# Patient Record
Sex: Male | Born: 1990 | Race: Black or African American | Hispanic: No | Marital: Single
Health system: Southern US, Academic
[De-identification: ages and names within clinical notes are randomized; demographics above are authoritative.]

## PROBLEM LIST (undated history)

## (undated) ENCOUNTER — Encounter

## (undated) ENCOUNTER — Ambulatory Visit

## (undated) ENCOUNTER — Ambulatory Visit: Attending: Clinical | Primary: Clinical

## (undated) ENCOUNTER — Encounter
Attending: Student in an Organized Health Care Education/Training Program | Primary: Student in an Organized Health Care Education/Training Program

## (undated) ENCOUNTER — Encounter: Attending: Blood Banking & Transfusion Medicine | Primary: Blood Banking & Transfusion Medicine

## (undated) ENCOUNTER — Ambulatory Visit: Payer: PRIVATE HEALTH INSURANCE | Attending: Physician Assistant | Primary: Physician Assistant

## (undated) ENCOUNTER — Encounter: Attending: Neurology | Primary: Neurology

## (undated) ENCOUNTER — Ambulatory Visit: Payer: PRIVATE HEALTH INSURANCE

## (undated) ENCOUNTER — Telehealth

## (undated) ENCOUNTER — Ambulatory Visit: Payer: MEDICAID

## (undated) ENCOUNTER — Ambulatory Visit: Payer: MEDICAID | Attending: Neurology | Primary: Neurology

## (undated) ENCOUNTER — Encounter: Attending: Physician Assistant | Primary: Physician Assistant

## (undated) ENCOUNTER — Encounter: Attending: Clinical | Primary: Clinical

## (undated) ENCOUNTER — Encounter
Attending: Pharmacist Clinician (PhC)/ Clinical Pharmacy Specialist | Primary: Pharmacist Clinician (PhC)/ Clinical Pharmacy Specialist

## (undated) ENCOUNTER — Telehealth
Attending: Student in an Organized Health Care Education/Training Program | Primary: Student in an Organized Health Care Education/Training Program

## (undated) ENCOUNTER — Encounter: Attending: Pharmacist | Primary: Pharmacist

## (undated) ENCOUNTER — Ambulatory Visit: Payer: PRIVATE HEALTH INSURANCE | Attending: Neurology | Primary: Neurology

## (undated) ENCOUNTER — Ambulatory Visit: Attending: Pharmacist | Primary: Pharmacist

## (undated) ENCOUNTER — Other Ambulatory Visit

## (undated) ENCOUNTER — Telehealth
Attending: Pharmacist Clinician (PhC)/ Clinical Pharmacy Specialist | Primary: Pharmacist Clinician (PhC)/ Clinical Pharmacy Specialist

## (undated) ENCOUNTER — Telehealth: Attending: Neurology | Primary: Neurology

## (undated) ENCOUNTER — Ambulatory Visit
Payer: MEDICAID | Attending: Student in an Organized Health Care Education/Training Program | Primary: Student in an Organized Health Care Education/Training Program

## (undated) DIAGNOSIS — R569 Unspecified convulsions: Secondary | ICD-10-CM

## (undated) DIAGNOSIS — F209 Schizophrenia, unspecified: Secondary | ICD-10-CM

## (undated) DIAGNOSIS — U071 COVID-19: Secondary | ICD-10-CM

## (undated) HISTORY — PX: MOUTH SURGERY: SHX715

---

## 2016-12-18 ENCOUNTER — Emergency Department: Payer: Medicaid Other

## 2016-12-18 ENCOUNTER — Emergency Department
Admission: EM | Admit: 2016-12-18 | Discharge: 2016-12-18 | Disposition: A | Discharge status: Home / Self Care | Payer: Medicaid Other | Attending: Emergency Medicine | Admitting: Emergency Medicine

## 2016-12-18 ENCOUNTER — Encounter: Payer: Self-pay | Admitting: Intensive Care

## 2016-12-18 DIAGNOSIS — R569 Unspecified convulsions: Secondary | ICD-10-CM

## 2016-12-18 DIAGNOSIS — G40909 Epilepsy, unspecified, not intractable, without status epilepticus: Secondary | ICD-10-CM | POA: Insufficient documentation

## 2016-12-18 HISTORY — DX: Unspecified convulsions: R56.9

## 2016-12-18 LAB — BASIC METABOLIC PANEL
Anion gap: 7 (ref 5–15)
BUN: 9 mg/dL (ref 6–20)
CALCIUM: 8.9 mg/dL (ref 8.9–10.3)
CO2: 26 mmol/L (ref 22–32)
CREATININE: 0.98 mg/dL (ref 0.61–1.24)
Chloride: 102 mmol/L (ref 101–111)
GFR calc Af Amer: >60 mL/min (ref >60)
GFR calc non Af Amer: >60 mL/min (ref >60)
Glucose, Bld: 83 mg/dL (ref 65–99)
Potassium: 4.1 mmol/L (ref 3.5–5.1)
SODIUM: 135 mmol/L (ref 135–145)

## 2016-12-18 LAB — CBC
HCT: 40.4 % (ref 40.0–52.0)
Hemoglobin: 13.9 g/dL (ref 13.0–18.0)
MCH: 32 pg (ref 26.0–34.0)
MCHC: 34.5 g/dL (ref 32.0–36.0)
MCV: 92.8 fL (ref 80.0–100.0)
Platelets: 142 10*3/uL — ABNORMAL LOW (ref 150–440)
RBC: 4.35 MIL/uL — ABNORMAL LOW (ref 4.40–5.90)
RDW: 12.9 % (ref 11.5–14.5)
WBC: 6.1 10*3/uL (ref 3.8–10.6)

## 2016-12-18 LAB — URINALYSIS, COMPLETE (UACMP) WITH MICROSCOPIC
Bacteria, UA: NONE SEEN
Bilirubin Urine: NEGATIVE
GLUCOSE, UA: NEGATIVE mg/dL
Hgb urine dipstick: NEGATIVE
Ketones, ur: 5 mg/dL — AB
Leukocytes, UA: NEGATIVE
Nitrite: NEGATIVE
PROTEIN: NEGATIVE mg/dL
Specific Gravity, Urine: 1.02 (ref 1.005–1.030)
Squamous Epithelial / LPF: NONE SEEN
pH: 7 (ref 5.0–8.0)

## 2016-12-18 LAB — VALPROIC ACID LEVEL: VALPROIC ACID LVL: 104 ug/mL — AB (ref 50.0–100.0)

## 2016-12-18 MED ORDER — LORAZEPAM 2 MG/ML IJ SOLN
2.0000 mg | Freq: Once | INTRAMUSCULAR | Status: AC
Start: 2016-12-18 — End: 2016-12-18
  Administered 2016-12-18: 2 mg via INTRAVENOUS

## 2016-12-18 MED ORDER — LORAZEPAM 2 MG/ML IJ SOLN
INTRAMUSCULAR | Status: AC
  Administered 2016-12-18: 2 mg via INTRAVENOUS
  Filled 2016-12-18: qty 1

## 2016-12-18 NOTE — ED Provider Notes (Signed)
[facility] Emergency Department Provider Note  ____________________________________________   First MD Initiated Contact with Patient 12/18/16 1637     (approximate)  I have reviewed the triage vital signs and the nursing notes.   HISTORY  Chief Complaint Seizures   HPI Daniel Craig is a 26 y.o. male with a history of TBI and seizure disorder on valproic acid as well as Lamictal who is presenting to the emergency department today with seizures. EMS says that he started seizing about 20 minutes prior to his arrival to the emergency department. They said that his seizure activity has been intermittent with drawing up of his arms bilaterally as well as staring into space and shaking of his upper and lower extremities at different times. EMS also states that they have seen the patient look at them attentively and then go back to seizing.   Past Medical History:  Diagnosis Date  . Seizures (HCC)     There are no active problems to display for this patient.   History reviewed. No pertinent surgical history.  Prior to Admission medications   Not on File    Allergies Keppra [levetiracetam]; Trileptal [oxcarbazepine]; and Vimpat [lacosamide]  History reviewed. No pertinent family history.  Social History Social History  Substance Use Topics  . Smoking status: Not on file  . Smokeless tobacco: Not on file  . Alcohol use Not on file    Review of Systems L5 caveat secondary to patient actively seizing  ____________________________________________   PHYSICAL EXAM:  VITAL SIGNS: ED Triage Vitals  Enc Vitals Group     BP 12/18/16 1635 118/75     Pulse Rate 12/18/16 1628 92     Resp 12/18/16 1628 19     Temp 12/18/16 1628 99.1 F (37.3 C)     Temp Source 12/18/16 1628 Rectal     SpO2 12/18/16 1628 100 %     Weight 12/18/16 1629 220 lb (99.8 kg)     Height 12/18/16 1629 6' (1.829 m)     Head Circumference --      Peak Flow --      Pain  Score --      Pain Loc --      Pain Edu? --      Excl. in GC? --     Constitutional: Patient with elbows flexed staring straight ahead when eyelids drawn back. No convulsions of the bilateral lower extremities. Patient is responding to pain and localizes to sternal rub with grimace and moving his arms towards my hands over his sternum. Eyes: Conjunctivae are normal. PERRL. EOMI. Head: Atraumatic. Nose: No congestion/rhinnorhea. Mouth/Throat: Mucous membranes are moist.   Neck: No stridor.   Cardiovascular: Normal rate, regular rhythm. Grossly normal heart sounds.  Respiratory: Normal respiratory effort.  No retractions. Lungs CTAB. Gastrointestinal: Soft and nontender. No distention.  Musculoskeletal: No lower extremity tenderness nor edema.  No joint effusions. Neurologic:  Patient actively seizing upon initial evaluation. Skin:  Skin is warm, dry and intact. No rash noted. Psychiatric: Mood and affect are normal. Speech and behavior are normal.  ____________________________________________   LABS (all labs ordered are listed, but only abnormal results are displayed)  Labs Reviewed  CBC - Abnormal; Notable for the following:       Result Value   RBC 4.35 (*)    Platelets 142 (*)    All other components within normal limits  VALPROIC ACID LEVEL - Abnormal; Notable for the following:    Valproic Acid Lvl  104 (*)    All other components within normal limits  URINALYSIS, COMPLETE (UACMP) WITH MICROSCOPIC - Abnormal; Notable for the following:    Color, Urine YELLOW (*)    APPearance CLEAR (*)    Ketones, ur 5 (*)    All other components within normal limits  BASIC METABOLIC PANEL  CBG MONITORING, ED   ____________________________________________  EKG   ____________________________________________  RADIOLOGY  DG Chest 1 View (Final result)  Result time 12/18/16 17:28:38  Final result by Charline Bills, MD (12/18/16 17:28:38)           Narrative:   CLINICAL  DATA: Seizure  EXAM: CHEST 1 VIEW  COMPARISON: None.  FINDINGS: Lungs are clear. Diffusion  The heart is normal in size.  IMPRESSION: No evidence of acute cardiopulmonary disease.   Electronically Signed By: Charline Bills M.D. On: 12/18/2016 17:28            CT Head Wo Contrast (Final result)  Result time 12/18/16 17:06:42  Final result by Awilda Metro, MD (12/18/16 17:06:42)           Narrative:   CLINICAL DATA: Seizures, currently nonverbal.  EXAM: CT HEAD WITHOUT CONTRAST  TECHNIQUE: Contiguous axial images were obtained from the base of the skull through the vertex without intravenous contrast.  COMPARISON: None.  FINDINGS: BRAIN: The ventricles and sulci are normal. No intraparenchymal hemorrhage, mass effect nor midline shift. No acute large vascular territory infarcts. No abnormal extra-axial fluid collections. Basal cisterns are patent.  VASCULAR: Unremarkable.  SKULL/SOFT TISSUES: No skull fracture. No significant soft tissue swelling.  ORBITS/SINUSES: The included ocular globes and orbital contents are normal.The mastoid aircells and included paranasal sinuses are well-aerated.  OTHER: None.  IMPRESSION: Normal CT HEAD.   Electronically Signed By: Awilda Metro M.D. On: 12/18/2016 17:06            ____________________________________________   PROCEDURES  Procedure(s) performed:   Procedures  Critical Care performed:   ____________________________________________   INITIAL IMPRESSION / ASSESSMENT AND PLAN / ED COURSE  Pertinent labs & imaging results that were available during my care of the patient were reviewed by me and considered in my medical decision making (see chart for details).  ----------------------------------------- 5:36 PM on 12/18/2016 -----------------------------------------  Patient is awake and alert at this time. Moving all 4 extremities and communicating with  staff. Had been given 2 mg of Ativan in addition after the Versed en route.    ----------------------------------------- 7:06 PM on 12/18/2016 -----------------------------------------  Patient back to baseline now. No complaints. Says that he feels well. Vitals are normal. Discussed the case with Dr. oh*neurology who does not recommend any medication changes. Because the patient appeared to be semiconscious he may have been having a pseudoseizure. Workup has been reassuring as well. Will be discharged home. Patient says that he has a neurologist that he sees as an outpatient. No visits on record on the emr.    ____________________________________________   FINAL CLINICAL IMPRESSION(S) / ED DIAGNOSES  Seizure versus pseudoseizure.    NEW MEDICATIONS STARTED DURING THIS VISIT:  New Prescriptions   No medications on file     Note:  This document was prepared using Dragon voice recognition software and may include unintentional dictation errors.    Myrna Blazer, MD 12/18/16 [phone]

## 2016-12-18 NOTE — ED Notes (Signed)
Spoke with the caregiver from the group home, states to call Tora PerchesBilly Coleman [phone] to pick the pt up.

## 2016-12-18 NOTE — ED Triage Notes (Addendum)
Patient arrived by EMS from group home for c/o seizures. EMS reports patient was laying on couch on the group home porch with his arms clenched to his chest and his head shaking. Patient is non verbal at this time and refuses to speak to staff. Patient responds to pain. Upon arrival to ER patients body was shaking and eyes closed. Group home reported to EMS that patient had not been home all day. Pt grimacing during sternal rubs

## 2016-12-18 NOTE — ED Notes (Signed)
Waiting for parents to arrive to d/c patient. Spoke with group home manger about d/c instructions

## 2016-12-18 NOTE — ED Notes (Addendum)
PAtient grimacing during In and out cath and sternal rubs

## 2016-12-27 ENCOUNTER — Emergency Department
Admission: EM | Admit: 2016-12-27 | Discharge: 2016-12-27 | Disposition: A | Discharge status: Home / Self Care | Payer: Medicaid Other | Attending: Emergency Medicine | Admitting: Emergency Medicine

## 2016-12-27 ENCOUNTER — Encounter: Payer: Self-pay | Admitting: Emergency Medicine

## 2016-12-27 ENCOUNTER — Emergency Department: Payer: Medicaid Other

## 2016-12-27 DIAGNOSIS — S9001XA Contusion of right ankle, initial encounter: Secondary | ICD-10-CM | POA: Diagnosis not present

## 2016-12-27 DIAGNOSIS — Y999 Unspecified external cause status: Secondary | ICD-10-CM | POA: Diagnosis not present

## 2016-12-27 DIAGNOSIS — S8991XA Unspecified injury of right lower leg, initial encounter: Secondary | ICD-10-CM | POA: Diagnosis present

## 2016-12-27 DIAGNOSIS — Y929 Unspecified place or not applicable: Secondary | ICD-10-CM | POA: Insufficient documentation

## 2016-12-27 DIAGNOSIS — W1839XA Other fall on same level, initial encounter: Secondary | ICD-10-CM | POA: Insufficient documentation

## 2016-12-27 DIAGNOSIS — Y939 Activity, unspecified: Secondary | ICD-10-CM | POA: Diagnosis not present

## 2016-12-27 NOTE — ED Provider Notes (Signed)
[facility] Emergency Department Provider Note  ____________________________________________   First MD Initiated Contact with Patient 12/27/16 1558     (approximate)  I have reviewed the triage vital signs and the nursing notes.   HISTORY  Chief Complaint Ankle Pain   HPI Daniel Craig is a 26 y.o. male is here with complaint of ankle pain.Patient states that he was smiling with his right ankle hurting. He states that a person fell on his ankle a couple days ago. He has not taken any over-the-counter medication as he is in a group home. He denies any knowledge of any prior ankle injuries. Patient has continue to ambulate since this injury but states "it hurts".   Past Medical History:  Diagnosis Date  . Seizures (HCC)     There are no active problems to display for this patient.   History reviewed. No pertinent surgical history.  Prior to Admission medications   Medication Sig Start Date End Date Taking? Authorizing Provider  divalproex (DEPAKOTE) 500 MG DR tablet Take 500 mg by mouth 2 (two) times daily.   Yes Historical Provider, MD  lamoTRIgine (LAMICTAL) 100 MG tablet Take 100 mg by mouth daily.   Yes Historical Provider, MD  lamoTRIgine (LAMICTAL) 200 MG tablet Take 200 mg by mouth daily.   Yes Historical Provider, MD  OLANZapine (ZYPREXA) 10 MG tablet Take 10 mg by mouth at bedtime.   Yes Historical Provider, MD  risperiDONE (RISPERDAL) 3 MG tablet Take 3 mg by mouth at bedtime.   Yes Historical Provider, MD    Allergies Keppra [levetiracetam]; Trileptal [oxcarbazepine]; and Vimpat [lacosamide]  No family history on file.  Social History Social History  Substance Use Topics  . Smoking status: Never Smoker  . Smokeless tobacco: Never Used  . Alcohol use Not on file    Review of Systems Constitutional: No fever/chills Cardiovascular: Denies chest pain. Respiratory: Denies shortness of breath. Gastrointestinal:   No nausea, no  vomiting.  Musculoskeletal: Positive for right ankle pain. Skin: Negative for rash. Neurological: Negative for  focal weakness or numbness.  10-point ROS otherwise negative.  ____________________________________________   PHYSICAL EXAM:  VITAL SIGNS: ED Triage Vitals  Enc Vitals Group     BP      Pulse      Resp      Temp      Temp src      SpO2      Weight      Height      Head Circumference      Peak Flow      Pain Score      Pain Loc      Pain Edu?      Excl. in GC?     Constitutional: Alert and oriented. Well appearing and in no acute distress. Eyes: Conjunctivae are normal. PERRL. EOMI. Head: Atraumatic. Neck: No stridor.   Cardiovascular: Normal rate, regular rhythm. Grossly normal heart sounds.  Good peripheral circulation. Respiratory: Normal respiratory effort.  No retractions. Lungs CTAB. Gastrointestinal: Soft and nontender. No distention.  Musculoskeletal: Examination of the right ankle there is no gross deformity noted. There is some tenderness on palpation of the medial and lateral ankle however patient points to the medial aspect when describing his pain. No soft tissue swelling is present. No ecchymosis or abrasions is noted to the area. Patient is able move digits without any difficulty. Pulse present. Neurologic:  Normal speech and language. No gross focal neurologic deficits are appreciated. No  gait instability. Skin:  Skin is warm, dry and intact. Psychiatric: Mood and affect are normal. Speech and behavior are normal.  ____________________________________________   LABS (all labs ordered are listed, but only abnormal results are displayed)  Labs Reviewed - No data to display  RADIOLOGY  Right ankle x-ray per radiologist is negative for fracture or dislocation. I, Tommi Rumps, personally viewed and evaluated these images (plain radiographs) as part of my medical decision making, as well as reviewing the written report by the  radiologist. ____________________________________________   PROCEDURES  Procedure(s) performed: None  Procedures  Critical Care performed: No  ____________________________________________   INITIAL IMPRESSION / ASSESSMENT AND PLAN / ED COURSE  Pertinent labs & imaging results that were available during my care of the patient were reviewed by me and considered in my medical decision making (see chart for details).  Patient was made aware that he does not have any fractures. Someone from the group home is coming to pick patient up. He is to take ibuprofen or Tylenol if needed for pain. He is encouraged to use ice and elevate his foot as needed for pain. He will follow-up with his primary care or [facility] if any continued problems.      ____________________________________________   FINAL CLINICAL IMPRESSION(S) / ED DIAGNOSES  Final diagnoses:  Contusion of right ankle, initial encounter      NEW MEDICATIONS STARTED DURING THIS VISIT:  Discharge Medication List as of 12/27/2016  5:08 PM       Note:  This document was prepared using Dragon voice recognition software and may include unintentional dictation errors.    Tommi Rumps, PA-C 12/27/16 1736    Minna Antis, MD 12/28/16 2036

## 2016-12-27 NOTE — Discharge Instructions (Signed)
Ice and elevate your foot and ankle for pain and swelling. Tylenol or ibuprofen if needed for pain Follow up with your doctor or [facility] if any continued problems

## 2016-12-27 NOTE — ED Triage Notes (Signed)
Brought in via group home with pain to right foot/ankle

## 2017-01-03 ENCOUNTER — Emergency Department
Admission: EM | Admit: 2017-01-03 | Discharge: 2017-01-03 | Disposition: A | Discharge status: Home / Self Care | Payer: Medicaid Other | Attending: Emergency Medicine | Admitting: Emergency Medicine

## 2017-01-03 DIAGNOSIS — Z79899 Other long term (current) drug therapy: Secondary | ICD-10-CM | POA: Diagnosis not present

## 2017-01-03 DIAGNOSIS — R404 Transient alteration of awareness: Secondary | ICD-10-CM | POA: Insufficient documentation

## 2017-01-03 DIAGNOSIS — R4182 Altered mental status, unspecified: Secondary | ICD-10-CM | POA: Diagnosis present

## 2017-01-03 HISTORY — DX: Schizophrenia, unspecified: F20.9

## 2017-01-03 LAB — URINE DRUG SCREEN, QUALITATIVE (ARMC ONLY)
Amphetamines, Ur Screen: NOT DETECTED
BARBITURATES, UR SCREEN: NOT DETECTED
Benzodiazepine, Ur Scrn: NOT DETECTED
COCAINE METABOLITE, UR ~~LOC~~: NOT DETECTED
Cannabinoid 50 Ng, Ur ~~LOC~~: NOT DETECTED
MDMA (ECSTASY) UR SCREEN: NOT DETECTED
METHADONE SCREEN, URINE: NOT DETECTED
OPIATE, UR SCREEN: NOT DETECTED
Phencyclidine (PCP) Ur S: NOT DETECTED
TRICYCLIC, UR SCREEN: NOT DETECTED

## 2017-01-03 LAB — URINALYSIS, COMPLETE (UACMP) WITH MICROSCOPIC
BILIRUBIN URINE: NEGATIVE
Bacteria, UA: NONE SEEN
Glucose, UA: NEGATIVE mg/dL
Hgb urine dipstick: NEGATIVE
Ketones, ur: NEGATIVE mg/dL
LEUKOCYTES UA: NEGATIVE
NITRITE: NEGATIVE
PH: 8 (ref 5.0–8.0)
Protein, ur: NEGATIVE mg/dL
RBC / HPF: NONE SEEN RBC/hpf (ref 0–5)
SPECIFIC GRAVITY, URINE: 1.005 (ref 1.005–1.030)
SQUAMOUS EPITHELIAL / LPF: NONE SEEN
WBC, UA: NONE SEEN WBC/hpf (ref 0–5)

## 2017-01-03 LAB — CBC
HEMATOCRIT: 42.8 % (ref 40.0–52.0)
HEMOGLOBIN: 14.7 g/dL (ref 13.0–18.0)
MCH: 32 pg (ref 26.0–34.0)
MCHC: 34.4 g/dL (ref 32.0–36.0)
MCV: 93 fL (ref 80.0–100.0)
Platelets: 147 10*3/uL — ABNORMAL LOW (ref 150–440)
RBC: 4.6 MIL/uL (ref 4.40–5.90)
RDW: 13.4 % (ref 11.5–14.5)
WBC: 7.4 10*3/uL (ref 3.8–10.6)

## 2017-01-03 LAB — COMPREHENSIVE METABOLIC PANEL
ALBUMIN: 4.3 g/dL (ref 3.5–5.0)
ALT: 25 U/L (ref 17–63)
ANION GAP: 9 (ref 5–15)
AST: 26 U/L (ref 15–41)
Alkaline Phosphatase: 39 U/L (ref 38–126)
BUN: 6 mg/dL (ref 6–20)
CO2: 26 mmol/L (ref 22–32)
Calcium: 9.8 mg/dL (ref 8.9–10.3)
Chloride: 100 mmol/L — ABNORMAL LOW (ref 101–111)
Creatinine, Ser: 0.79 mg/dL (ref 0.61–1.24)
GFR calc Af Amer: >60 mL/min (ref >60)
GFR calc non Af Amer: >60 mL/min (ref >60)
Glucose, Bld: 116 mg/dL — ABNORMAL HIGH (ref 65–99)
POTASSIUM: 3.8 mmol/L (ref 3.5–5.1)
SODIUM: 135 mmol/L (ref 135–145)
Total Bilirubin: 0.7 mg/dL (ref 0.3–1.2)
Total Protein: 7.4 g/dL (ref 6.5–8.1)

## 2017-01-03 LAB — SALICYLATE LEVEL

## 2017-01-03 LAB — ACETAMINOPHEN LEVEL: Acetaminophen (Tylenol), Serum: <10 ug/mL — ABNORMAL LOW (ref 10–30)

## 2017-01-03 MED ORDER — DIPHENHYDRAMINE HCL 25 MG PO CAPS
25.0000 mg | ORAL_CAPSULE | Freq: Once | ORAL | Status: AC
  Administered 2017-01-03: 25 mg via ORAL
  Filled 2017-01-03: qty 1

## 2017-01-03 MED ORDER — LORAZEPAM 2 MG/ML IJ SOLN
1.0000 mg | Freq: Once | INTRAMUSCULAR | Status: AC
  Administered 2017-01-03: 1 mg via INTRAVENOUS

## 2017-01-03 MED ORDER — LORAZEPAM 2 MG/ML IJ SOLN
INTRAMUSCULAR | Status: AC
  Administered 2017-01-03: 1 mg via INTRAVENOUS
  Filled 2017-01-03: qty 1

## 2017-01-03 NOTE — ED Notes (Signed)
2 cig packs, lighter, black jacket, black beanie, 2 white shirts (muscle and tee), sweatpants, shorts, boxers, pair of shoes (one without laces), verizon galaxy cell phone.

## 2017-01-03 NOTE — ED Notes (Signed)
Pt  Appears to be repetitively praying, pt states he sees and hears God when asked in a yes/no question. States God is in the room with him. Still speaking in different language. Speech has slowed down to normal pace.   Pam EDT at bedside to monitor pt.

## 2017-01-03 NOTE — ED Provider Notes (Signed)
[facility] Emergency Department Provider Note  ____________________________________________  Time seen: Approximately 10:00 PM  I have reviewed the triage vital signs and the nursing notes.   HISTORY  Chief Complaint Altered Mental Status  Additional history obtained from parents at bedside when available  HPI Daniel Craig is a 26 y.o. male sent to the ED from group home due to altered mental status. This afternoon he had just gotten off the phone with his dad where he was reporting that his mouth felt itchy. Immediately afterward, he began to shake in both arms and speak incoherently. This has happened before. There is report no recent illness. His neurologist at [facility] has been titrating down his Depakote over the past week. Initially patient not able to provide any history but does follow commands despite the vigorous shaking in both arms..     Past Medical History:  Diagnosis Date  . Schizophrenia (HCC)   . Seizures (HCC)   Traumatic brain injury as a child   There are no active problems to display for this patient.    History reviewed. No pertinent surgical history.   Prior to Admission medications   Medication Sig Start Date End Date Taking? Authorizing Provider  divalproex (DEPAKOTE) 500 MG DR tablet Take 500 mg by mouth 2 (two) times daily.    Historical Provider, MD  lamoTRIgine (LAMICTAL) 100 MG tablet Take 100 mg by mouth daily.    Historical Provider, MD  lamoTRIgine (LAMICTAL) 200 MG tablet Take 200 mg by mouth daily.    Historical Provider, MD  OLANZapine (ZYPREXA) 10 MG tablet Take 10 mg by mouth at bedtime.    Historical Provider, MD  risperiDONE (RISPERDAL) 3 MG tablet Take 3 mg by mouth at bedtime.    Historical Provider, MD     Allergies Keppra [levetiracetam]; Trileptal [oxcarbazepine]; and Vimpat [lacosamide]   History reviewed. No pertinent family history.  Social History Social History  Substance Use Topics  . Smoking  status: Never Smoker  . Smokeless tobacco: Never Used  . Alcohol use Not on file    Review of Systems  Constitutional:   No fever or chills.  ENT:   No sore throat. Runny nose. Cardiovascular:   No chest pain. Respiratory:   No dyspnea positive nonproductive cough. Gastrointestinal:   Negative for abdominal pain, vomiting and diarrhea.  Genitourinary:   Negative for dysuria or difficulty urinating. Musculoskeletal:   Negative for focal pain or swelling Neurological:   Negative for headaches 10-point ROS otherwise negative.  ____________________________________________   PHYSICAL EXAM:  VITAL SIGNS: ED Triage Vitals [01/03/17 1807]  Enc Vitals Group     BP (!) 148/109     Pulse Rate 93     Resp 16     Temp 98.8 F (37.1 C)     Temp Source Oral     SpO2 99 %     Weight      Height      Head Circumference      Peak Flow      Pain Score      Pain Loc      Pain Edu?      Excl. in GC?     Vital signs reviewed, nursing assessments reviewed.   Constitutional:   Awake and alert. Not answering questions.. Eyes:   No scleral icterus. No conjunctival pallor. PERRL. EOMI.  No nystagmus. ENT   Head:   Normocephalic and atraumatic.   Nose:   No congestion/rhinnorhea. No septal hematoma  Mouth/Throat:   MMM, no pharyngeal erythema. No peritonsillar mass.    Neck:   No stridor. No SubQ emphysema. No meningismus. Hematological/Lymphatic/Immunilogical:   No cervical lymphadenopathy. Cardiovascular:   RRR. Symmetric bilateral radial and DP pulses.  No murmurs.  Respiratory:   Normal respiratory effort without tachypnea nor retractions. Breath sounds are clear and equal bilaterally. No wheezes/rales/rhonchi. Gastrointestinal:   Soft and nontender. Non distended. There is no CVA tenderness.  No rebound, rigidity, or guarding. Genitourinary:   deferred Musculoskeletal:   Nontender with normal range of motion in all extremities. No joint effusions.  No lower extremity  tenderness.  No edema. Neurologic:   Speaking in incoherent phrases, repetitively.  CN 2-10 normal. Vigorous shaking rhythmically of bilateral upper extremities. Lower extremities are relaxed with normal tone.. Patient able to follow commands No gross focal neurologic deficits are appreciated.  Skin:    Skin is warm, dry and intact. No rash noted.  No petechiae, purpura, or bullae.  ____________________________________________    LABS (pertinent positives/negatives) (all labs ordered are listed, but only abnormal results are displayed) Labs Reviewed  COMPREHENSIVE METABOLIC PANEL - Abnormal; Notable for the following:       Result Value   Chloride 100 (*)    Glucose, Bld 116 (*)    All other components within normal limits  CBC - Abnormal; Notable for the following:    Platelets 147 (*)    All other components within normal limits  URINALYSIS, COMPLETE (UACMP) WITH MICROSCOPIC - Abnormal; Notable for the following:    Color, Urine STRAW (*)    APPearance CLEAR (*)    All other components within normal limits  ACETAMINOPHEN LEVEL - Abnormal; Notable for the following:    Acetaminophen (Tylenol), Serum <10 (*)    All other components within normal limits  URINE DRUG SCREEN, QUALITATIVE ([facility] ONLY)  SALICYLATE LEVEL  CBG MONITORING, ED   ____________________________________________   EKG    ____________________________________________    RADIOLOGY    ____________________________________________   PROCEDURES Procedures  ____________________________________________   INITIAL IMPRESSION / ASSESSMENT AND PLAN / ED COURSE  Pertinent labs & imaging results that were available during my care of the patient were reviewed by me and considered in my medical decision making (see chart for details).  Patient presents with shaking episode and incoherent speech. This is happened before. Patient was given IV Ativan which calmed him significantly. On reassessment, he was  still speaking incoherently but commonly. He is answering yes no questions by shaking his head and pointing. Following commands still. I then brought the parents to the bedside, and on reassessing the patient he was now speaking normally. Back to baseline according to parents. He just reports that his mouth is been feeling itchy and that he has a cold. No headaches or trauma.  Presentation is not consistent with seizure. Low suspicion for meningitis encephalitis intracranial hemorrhage or stroke. 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 Continue to follow up with his doctors. Benadryl for symptomatic relief currently.       ____________________________________________   FINAL CLINICAL IMPRESSION(S) / ED DIAGNOSES  Final diagnoses:  Transient alteration of awareness      New Prescriptions   No medications on file     Portions of this note were generated with dragon dictation software. Dictation errors may occur despite best attempts at proofreading.    Sharman CheekPhillip Chyrl Elwell, MD 01/03/17 2215

## 2017-01-03 NOTE — ED Notes (Signed)
Pt peed in urinal for [employer] RN

## 2017-01-03 NOTE — ED Triage Notes (Signed)
Pt arrives to ED via [facility] from group home [address]. 10 minutes PTA of EMS at group home pt started talking excessively fast in incomprehensible speech. Unresponsive to voice. Starting at ceiling. EMS VS 100HR, 100% RA, 134/80.    Hx seizure disorder, schizophrenia, TBI.

## 2017-01-03 NOTE — ED Notes (Addendum)
PT is able to nod or shake head to yes/no questions. Pt denies taking drugs or drinking today. Pt appears to be speaking in different languages (spanish, latin). Pt will not speak in english. Pt keeps saying Antonio but shakes head if he knows an [place]. Pt nods when asked if he took his medication today. Pt is communicating to staff while still speaking fast. Pt states he is talking to God when asked.

## 2017-01-03 NOTE — ED Notes (Signed)

## 2017-01-06 ENCOUNTER — Emergency Department: Payer: Medicaid Other

## 2017-01-06 ENCOUNTER — Encounter: Payer: Self-pay | Admitting: Emergency Medicine

## 2017-01-06 ENCOUNTER — Emergency Department
Admission: EM | Admit: 2017-01-06 | Discharge: 2017-01-06 | Disposition: A | Discharge status: Home / Self Care | Payer: Medicaid Other | Attending: Emergency Medicine | Admitting: Emergency Medicine

## 2017-01-06 DIAGNOSIS — R251 Tremor, unspecified: Secondary | ICD-10-CM | POA: Diagnosis present

## 2017-01-06 DIAGNOSIS — R569 Unspecified convulsions: Secondary | ICD-10-CM

## 2017-01-06 DIAGNOSIS — F445 Conversion disorder with seizures or convulsions: Secondary | ICD-10-CM | POA: Diagnosis not present

## 2017-01-06 DIAGNOSIS — F209 Schizophrenia, unspecified: Secondary | ICD-10-CM | POA: Insufficient documentation

## 2017-01-06 LAB — CBC WITH DIFFERENTIAL/PLATELET
BASOS ABS: 0 10*3/uL (ref 0–0.1)
Basophils Relative: 0 %
EOS ABS: 0 10*3/uL (ref 0–0.7)
EOS PCT: 0 %
HCT: 40.7 % (ref 40.0–52.0)
Hemoglobin: 13.9 g/dL (ref 13.0–18.0)
LYMPHS PCT: 55 %
Lymphs Abs: 3.2 10*3/uL (ref 1.0–3.6)
MCH: 32.2 pg (ref 26.0–34.0)
MCHC: 34.2 g/dL (ref 32.0–36.0)
MCV: 94.3 fL (ref 80.0–100.0)
Monocytes Absolute: 1.1 10*3/uL — ABNORMAL HIGH (ref 0.2–1.0)
Monocytes Relative: 18 %
Neutro Abs: 1.6 10*3/uL (ref 1.4–6.5)
Neutrophils Relative %: 27 %
PLATELETS: 133 10*3/uL — AB (ref 150–440)
RBC: 4.31 MIL/uL — AB (ref 4.40–5.90)
RDW: 12.9 % (ref 11.5–14.5)
WBC: 6 10*3/uL (ref 3.8–10.6)

## 2017-01-06 LAB — BASIC METABOLIC PANEL
ANION GAP: 4 — AB (ref 5–15)
BUN: 9 mg/dL (ref 6–20)
CALCIUM: 8.9 mg/dL (ref 8.9–10.3)
CO2: 28 mmol/L (ref 22–32)
Chloride: 104 mmol/L (ref 101–111)
Creatinine, Ser: 0.89 mg/dL (ref 0.61–1.24)
Glucose, Bld: 100 mg/dL — ABNORMAL HIGH (ref 65–99)
Potassium: 5 mmol/L (ref 3.5–5.1)
Sodium: 136 mmol/L (ref 135–145)

## 2017-01-06 LAB — VALPROIC ACID LEVEL: VALPROIC ACID LVL: 96 ug/mL (ref 50.0–100.0)

## 2017-01-06 LAB — MAGNESIUM: Magnesium: 1.9 mg/dL (ref 1.7–2.4)

## 2017-01-06 MED ORDER — DIPHENHYDRAMINE HCL 50 MG/ML IJ SOLN
25.0000 mg | Freq: Once | INTRAMUSCULAR | Status: AC
  Administered 2017-01-06: 25 mg via INTRAVENOUS
  Filled 2017-01-06: qty 1

## 2017-01-06 MED ORDER — LORAZEPAM 2 MG/ML IJ SOLN
1.0000 mg | Freq: Once | INTRAMUSCULAR | Status: AC
  Administered 2017-01-06: 1 mg via INTRAVENOUS
  Filled 2017-01-06: qty 1

## 2017-01-06 MED ORDER — LORAZEPAM 1 MG PO TABS: 1.0000 mg | ORAL_TABLET | Freq: Two times a day (BID) | ORAL | 0 refills | Status: DC | PRN

## 2017-01-06 NOTE — ED Notes (Signed)
Smiley HousemanRonnie Craig, patient's father [phone]

## 2017-01-06 NOTE — ED Notes (Addendum)
Spoke w/ pts mother on phone about pts discharge.  Pts mother verbalized understanding of pts lab work and scans being WNL.  Informed her of pts new medications and need to follow up.   This RN called group home, reached Barkley BrunsKristine [phone], went over pts discharge including new prescriptions and dosing.  Caregiver verbalized understanding.  ETA 20 min

## 2017-01-06 NOTE — ED Notes (Signed)
EMS reported on arrival that patient is coming from a group home for chief complaint of shaking. 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

## 2017-01-06 NOTE — ED Provider Notes (Signed)
Time Seen: Approximately *1845*  I have reviewed the triage notes  Chief Complaint: No chief complaint on file.   History of Present Illness: Daniel Craig is a 26 y.o. male who arrives by EMS after an episode of shaking which is been continuous since noon today. The patient's awake and alert and can answer questions and has what appears to be some voluntary twitching in both his upper extremities and his head. There is no spontaneous movement of his lower extremities. He was recently evaluated here for seizures and was diagnosed with pseudoseizures. The patient states he has been taking his medication denies any illicit drug usage in his urine drug screen 2 days ago was negative. Patient denies any suicidal thoughts, homicidal thoughts, hallucinations.   Past Medical History:  Diagnosis Date  . Schizophrenia (HCC)   . Seizures (HCC)     There are no active problems to display for this patient.   History reviewed. No pertinent surgical history.  History reviewed. No pertinent surgical history.  Current Outpatient Rx  . Order #: [id number] Class: Historical Med  . Order #: [id number] Class: Historical Med  . Order #: [id number] Class: Historical Med  . Order #: [id number] Class: Historical Med  . Order #: [id number] Class: Historical Med    Allergies:  Keppra [levetiracetam]; Trileptal [oxcarbazepine]; and Vimpat [lacosamide]  Family History: No family history on file.  Social History: Social History  Substance Use Topics  . Smoking status: Never Smoker  . Smokeless tobacco: Never Used  . Alcohol use Not on file     Review of Systems:   10 point review of systems was performed and was otherwise negative:  Constitutional: No fever Eyes: No visual disturbances ENT: No sore throat, ear pain Cardiac: No chest pain Respiratory: No shortness of breath, wheezing, or stridor Abdomen: No abdominal pain, no vomiting, No diarrhea Endocrine: No weight loss, No night  sweats Extremities: No peripheral edema, cyanosis Skin: No rashes, easy bruising Neurologic: No focal weakness, trouble with speech or swollowing Urologic: No dysuria, Hematuria, or urinary frequency   Physical Exam:  ED Triage Vitals  Enc Vitals Group     BP 01/06/17 1830 134/76     Pulse Rate 01/06/17 1830 99     Resp 01/06/17 1830 20     Temp 01/06/17 1830 98.5 F (36.9 C)     Temp Source 01/06/17 1830 Oral     SpO2 01/06/17 1830 99 %     Weight 01/06/17 1830 226 lb (102.5 kg)     Height 01/06/17 1830 6' (1.829 m)     Head Circumference --      Peak Flow --      Pain Score 01/06/17 1831 7     Pain Loc --      Pain Edu? --      Excl. in GC? --     General: Awake , Alert , and Oriented times 3; GCS 15 Voluntarily shaking both arms and head. But awake and answering questions Head: Normal cephalic , atraumatic Eyes: Pupils equal , round, reactive to light Nose/Throat: No nasal drainage, patent upper airway without erythema or exudate.  Neck: Supple, Full range of motion, No anterior adenopathy or palpable thyroid masses Lungs: Clear to ascultation without wheezes , rhonchi, or rales Heart: Regular rate, regular rhythm without murmurs , gallops , or rubs Abdomen: Soft, non tender without rebound, guarding , or rigidity; bowel sounds positive and symmetric in all 4 quadrants. No organomegaly .  Extremities: 2 plus symmetric pulses. No edema, clubbing or cyanosis Neurologic: normal ambulation, Motor symmetric without deficits, sensory intact Skin: warm, dry, no rashes   Labs:   All laboratory work was reviewed including any pertinent negatives or positives listed below:  Labs Reviewed  BASIC METABOLIC PANEL  CBC WITH DIFFERENTIAL/PLATELET  MAGNESIUM  VALPROIC ACID LEVEL  URINE DRUG SCREEN, QUALITATIVE ([facility] ONLY)  Patient's Depakote was within normal limits  Radiology:  "Dg Chest 1 View  Result Date: 12/18/2016 CLINICAL DATA:  Seizure EXAM: CHEST 1 VIEW  COMPARISON:  None. FINDINGS: Lungs are clear.  Diffusion The heart is normal in size. IMPRESSION: No evidence of acute cardiopulmonary disease. Electronically Signed   By: Charline BillsSriyesh  Krishnan M.D.   On: 12/18/2016 17:28   Dg Ankle Complete Right  Result Date: 12/27/2016 CLINICAL DATA:  26 year old male status post blunt trauma to the right ankle today with pain and unable to weightbear. Initial encounter. EXAM: RIGHT ANKLE - COMPLETE 3+ VIEW COMPARISON:  None. FINDINGS: Bone mineralization is within normal limits. The lateral view is mildly oblique. Right ankle joint alignment is preserved with no acute fracture identified about the right ankle. The talar dome, distal tibia and fibula appear intact. No ankle joint effusion is evident. The calcaneus and visible right foot osseous structures appear intact. IMPRESSION: No acute fracture or dislocation identified about the right ankle. Electronically Signed   By: Odessa FlemingH  Hall M.D.   On: 12/27/2016 16:37   Ct Head Wo Contrast  Result Date: 01/06/2017 CLINICAL DATA:  Possible seizure activity EXAM: CT HEAD WITHOUT CONTRAST TECHNIQUE: Contiguous axial images were obtained from the base of the skull through the vertex without intravenous contrast. COMPARISON:  12/18/2016 FINDINGS: Brain: No evidence of acute infarction, hemorrhage, hydrocephalus, extra-axial collection or mass lesion/mass effect. Vascular: No hyperdense vessel or unexpected calcification. Skull: Normal. Negative for fracture or focal lesion. Sinuses/Orbits: No acute finding. Other: None. IMPRESSION: No acute abnormality noted. Electronically Signed   By: Alcide CleverMark  Lukens M.D.   On: 01/06/2017 19:42   Ct Head Wo Contrast  Result Date: 12/18/2016 CLINICAL DATA:  Seizures, currently nonverbal. EXAM: CT HEAD WITHOUT CONTRAST TECHNIQUE: Contiguous axial images were obtained from the base of the skull through the vertex without intravenous contrast. COMPARISON:  None. FINDINGS: BRAIN: The ventricles and sulci are  normal. No intraparenchymal hemorrhage, mass effect nor midline shift. No acute large vascular territory infarcts. No abnormal extra-axial fluid collections. Basal cisterns are patent. VASCULAR: Unremarkable. SKULL/SOFT TISSUES: No skull fracture. No significant soft tissue swelling. ORBITS/SINUSES: The included ocular globes and orbital contents are normal.The mastoid aircells and included paranasal sinuses are well-aerated. OTHER: None. IMPRESSION: Normal CT HEAD. Electronically Signed   By: Awilda Metroourtnay  Bloomer M.D.   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 The patient had symptomatic improvement with IV Ativan and Benadryl. The patient require further outpatient follow-up with both neurology and psychiatry. I did not see any reason to hospitalize the patient this time though his had recurrent visits here for the same issue.     Final Clinical Impression:  Pseudoseizure Final diagnoses:  None     Plan: * Discharge " New Prescriptions   LORAZEPAM (ATIVAN) 1 MG TABLET    Take 1 tablet (1 mg total) by mouth 2 (two) times daily as needed for seizure (or shaking).  " Patient was advised  to return immediately if condition worsens. Patient was advised to follow up with their primary care physician or other specialized physicians involved in their outpatient care. The patient and/or family member/power of attorney had laboratory results reviewed at the bedside. All questions and concerns were addressed and appropriate discharge instructions were distributed by the nursing staff.             Jennye Moccasin, MD 01/06/17 2106

## 2017-01-06 NOTE — Discharge Instructions (Signed)
The patient's seizure workup was once again within normal limits. The patient had a head CT which showed no source of seizure and clearly what we witnessed here in emergency department appears to be pseudoseizures. These require further medical and psychiatric evaluation.  Please return immediately if condition worsens. Please contact her primary physician or the physician you were given for referral. If you have any specialist physicians involved in her treatment and plan please also contact them. Thank you for using [facility].

## 2017-01-06 NOTE — ED Triage Notes (Signed)
BIB EMS from group home. Pt reports since about 1200 today he has been shaking uncontrollably . Pt alert and oriented. Hx of seizures. States he is compliant with medication but thinks one of his medications was recently decreased.

## 2017-01-06 NOTE — ED Notes (Addendum)
Light green recollect sent to lab.  Pt noted to be sleeping in bed, no shaking noted.  Pt able to be awakened w/ stimulation. Pt sts that he feels better.

## 2017-01-06 NOTE — ED Notes (Signed)
Pt c/o shaking since this afternoon after church.  Pt denies CP, SOB, n/v/d or fever.  Pt sts that he does not know what brought on seizure, unable to answer questions regarding previous seizures completely.  Tremors noted in pts R arm, pt able to move arm to gesture.

## 2017-02-06 ENCOUNTER — Emergency Department
Admission: EM | Admit: 2017-02-06 | Discharge: 2017-02-06 | Disposition: A | Discharge status: Home / Self Care | Payer: Medicaid Other | Attending: Student in an Organized Health Care Education/Training Program | Admitting: Student in an Organized Health Care Education/Training Program

## 2017-02-06 ENCOUNTER — Encounter: Payer: Self-pay | Admitting: Emergency Medicine

## 2017-02-06 ENCOUNTER — Emergency Department: Payer: Medicaid Other

## 2017-02-06 DIAGNOSIS — G40909 Epilepsy, unspecified, not intractable, without status epilepticus: Secondary | ICD-10-CM | POA: Diagnosis present

## 2017-02-06 DIAGNOSIS — R569 Unspecified convulsions: Secondary | ICD-10-CM

## 2017-02-06 DIAGNOSIS — F17200 Nicotine dependence, unspecified, uncomplicated: Secondary | ICD-10-CM | POA: Insufficient documentation

## 2017-02-06 LAB — BASIC METABOLIC PANEL
ANION GAP: 7 (ref 5–15)
BUN: 8 mg/dL (ref 6–20)
CO2: 26 mmol/L (ref 22–32)
Calcium: 9.2 mg/dL (ref 8.9–10.3)
Chloride: 101 mmol/L (ref 101–111)
Creatinine, Ser: 0.74 mg/dL (ref 0.61–1.24)
GFR calc Af Amer: >60 mL/min (ref >60)
Glucose, Bld: 87 mg/dL (ref 65–99)
POTASSIUM: 4.2 mmol/L (ref 3.5–5.1)
Sodium: 134 mmol/L — ABNORMAL LOW (ref 135–145)

## 2017-02-06 LAB — VALPROIC ACID LEVEL: VALPROIC ACID LVL: 119 ug/mL — AB (ref 50.0–100.0)

## 2017-02-06 NOTE — ED Provider Notes (Signed)
[facility] Emergency Department Provider Note    First MD Initiated Contact with Patient 02/06/17 1707     (approximate)  I have reviewed the triage vital signs and the nursing notes.   HISTORY  Chief Complaint Seizures    HPI Daniel Craig is a 26 y.o. male with a history of schizophrenia as well as seizures disorder on Depakote elemental presents with seizure-like activity that occurred after falling in his chair and hitting his head. Patient is a group home facility called EMS. When EMS saw patient and he had upper body shaking but was talking at the same time. Shaking was bilateral. They gave him 2 mg intranasal Versed. Glucose 96. Patient otherwise in no acute distress. States he feels sleepy but does not have any headache. Has been compliant with his medications. Denies any SI or HI.   Past Medical History:  Diagnosis Date  . Schizophrenia (HCC)   . Seizures (HCC)     History reviewed. No pertinent surgical history. There are no active problems to display for this patient.     Prior to Admission medications   Medication Sig Start Date End Date Taking? Authorizing Provider  divalproex (DEPAKOTE) 500 MG DR tablet Take 500 mg by mouth 2 (two) times daily.    Historical Provider, MD  lamoTRIgine (LAMICTAL) 100 MG tablet Take 100 mg by mouth daily.    Historical Provider, MD  lamoTRIgine (LAMICTAL) 200 MG tablet Take 200 mg by mouth daily.    Historical Provider, MD  LORazepam (ATIVAN) 1 MG tablet Take 1 tablet (1 mg total) by mouth 2 (two) times daily as needed for seizure (or shaking). 01/06/17   Jennye Moccasin, MD  OLANZapine (ZYPREXA) 10 MG tablet Take 10 mg by mouth at bedtime.    Historical Provider, MD  risperiDONE (RISPERDAL) 3 MG tablet Take 3 mg by mouth at bedtime.    Historical Provider, MD    Allergies Keppra [levetiracetam]; Trileptal [oxcarbazepine]; and Vimpat [lacosamide]    Social History Social History  Substance Use  Topics  . Smoking status: Current Every Day Smoker  . Smokeless tobacco: Never Used  . Alcohol use No    Review of Systems Patient denies headaches, rhinorrhea, blurry vision, numbness, shortness of breath, chest pain, edema, cough, abdominal pain, nausea, vomiting, diarrhea, dysuria, fevers, rashes or hallucinations unless otherwise stated above in HPI. ____________________________________________   PHYSICAL EXAM:  VITAL SIGNS: Vitals:   02/06/17 1659  BP: 122/76  Pulse: 64  Resp: 14  Temp: 98.9 F (37.2 C)    Constitutional:  Well appearing young male  in no acute distress. Eyes: Conjunctivae are normal. PERRL. EOMI. Head: Atraumatic. Nose: No congestion/rhinnorhea. Mouth/Throat: Mucous membranes are moist.  Oropharynx non-erythematous. Neck: No stridor. Painless ROM. No cervical spine tenderness to palpation Hematological/Lymphatic/Immunilogical: No cervical lymphadenopathy. Cardiovascular: Normal rate, regular rhythm. Grossly normal heart sounds.  Good peripheral circulation. Respiratory: Normal respiratory effort.  No retractions. Lungs CTAB. Gastrointestinal: Soft and nontender. No distention. No abdominal bruits. No CVA tenderness. Genitourinary:  Musculoskeletal: No lower extremity tenderness nor edema.  No joint effusions. Neurologic:  Drowsy, but CN- intact.  No facial droop, Normal FNF.  Normal heel to shin.  Sensation intact bilaterally. Normal speech and language. No gross focal neurologic deficits are appreciated. No gait instability.  Skin:  Skin is warm, dry and intact. No rash noted. Psychiatric: Mood and affect are normal. Speech and behavior are normal.  ____________________________________________   LABS (all labs ordered are listed, but  only abnormal results are displayed)  Results for orders placed or performed during the hospital encounter of 02/06/17 (from the past 24 hour(s))  Basic metabolic panel     Status: Abnormal   Collection Time:  02/06/17  6:03 PM  Result Value Ref Range   Sodium 134 (L) 135 - 145 mmol/L   Potassium 4.2 3.5 - 5.1 mmol/L   Chloride 101 101 - 111 mmol/L   CO2 26 22 - 32 mmol/L   Glucose, Bld 87 65 - 99 mg/dL   BUN 8 6 - 20 mg/dL   Creatinine, Ser 1.610.74 0.61 - 1.24 mg/dL   Calcium 9.2 8.9 - 09.610.3 mg/dL   GFR calc non Af Amer >60 >60 mL/min   GFR calc Af Amer >60 >60 mL/min   Anion gap 7 5 - 15   ____________________________________________  EKG____________________________________________  RADIOLOGY  .primn  ____________________________________________   PROCEDURES  Procedure(s) performed:  Procedures    Critical Care performed: no ____________________________________________   INITIAL IMPRESSION / ASSESSMENT AND PLAN / ED COURSE  Pertinent labs & imaging results that were available during my care of the patient were reviewed by me and considered in my medical decision making (see chart for details).  DDX: electrolye abn, seizure, ich  Daniel Craig is a 26 y.o. who presents to the ED with Seizure-like activity occurred today. Patient arrives in no acute distress. A little drowsy after having presented. Based on reported head injury and subsequent seizure will order CT imaging of head and neck to evaluate for traumatic injury. CT imaging shows no evidence of acute manic injury. Blood work is otherwise reassuring. No change in suture behavior to suggest new foci. Patient able to and bili with a steady gait and able to tolerate oral hydration. The patient is stable for close outpatient follow-up.  Have discussed with the patient and available family all diagnostics and treatments performed thus far and all questions were answered to the best of my ability. The patient demonstrates understanding and agreement with plan.       ____________________________________________   FINAL CLINICAL IMPRESSION(S) / ED DIAGNOSES  Final diagnoses:  Seizure-like activity (HCC)      NEW  MEDICATIONS STARTED DURING THIS VISIT:  New Prescriptions   No medications on file     Note:  This document was prepared using Dragon voice recognition software and may include unintentional dictation errors.    Willy EddyPatrick Cloey Sferrazza, MD 02/06/17 [id number]

## 2017-02-06 NOTE — ED Triage Notes (Signed)
Brought by ems from group home on [address]  Ems says pt with uppper body shaking at facility and was talking at same time.   They gave versed 2 mg nasally.  fsbs was  96.  Currently pt has eyes closed but responds to verbal.  nad.

## 2017-02-06 NOTE — ED Notes (Signed)
Patient transported to CT 

## 2017-02-06 NOTE — ED Notes (Signed)
Lab stated they would be releasing results for valproic level in the next 10-15 minutes.  Patient updated.

## 2017-05-05 ENCOUNTER — Encounter: Payer: Self-pay | Admitting: Emergency Medicine

## 2017-05-05 ENCOUNTER — Emergency Department
Admission: EM | Admit: 2017-05-05 | Discharge: 2017-05-05 | Disposition: A | Discharge status: Home / Self Care | Payer: Medicaid Other | Attending: Emergency Medicine | Admitting: Emergency Medicine

## 2017-05-05 DIAGNOSIS — Z79899 Other long term (current) drug therapy: Secondary | ICD-10-CM | POA: Diagnosis not present

## 2017-05-05 DIAGNOSIS — F209 Schizophrenia, unspecified: Secondary | ICD-10-CM | POA: Diagnosis not present

## 2017-05-05 DIAGNOSIS — F17210 Nicotine dependence, cigarettes, uncomplicated: Secondary | ICD-10-CM | POA: Insufficient documentation

## 2017-05-05 DIAGNOSIS — L0291 Cutaneous abscess, unspecified: Secondary | ICD-10-CM

## 2017-05-05 DIAGNOSIS — N499 Inflammatory disorder of unspecified male genital organ: Secondary | ICD-10-CM | POA: Diagnosis not present

## 2017-05-05 DIAGNOSIS — N5089 Other specified disorders of the male genital organs: Secondary | ICD-10-CM | POA: Diagnosis present

## 2017-05-05 MED ORDER — CEPHALEXIN 500 MG PO CAPS
500.0000 mg | ORAL_CAPSULE | Freq: Once | ORAL | Status: AC
  Administered 2017-05-05: 500 mg via ORAL

## 2017-05-05 MED ORDER — BACITRACIN ZINC 500 UNIT/GM EX OINT
TOPICAL_OINTMENT | CUTANEOUS | Status: AC
  Filled 2017-05-05: qty 0.9

## 2017-05-05 MED ORDER — CEPHALEXIN 500 MG PO CAPS: 500.0000 mg | ORAL_CAPSULE | Freq: Three times a day (TID) | ORAL | 0 refills | Status: DC

## 2017-05-05 MED ORDER — CEPHALEXIN 500 MG PO CAPS
ORAL_CAPSULE | ORAL | Status: AC
  Filled 2017-05-05: qty 1

## 2017-05-05 NOTE — Discharge Instructions (Signed)
You are being treated for a skin abscess to the groin area. Keep the area clean and dry. Cover with antibiotic ointment as needed. [facility] provider or [facility] as needed for wound check. Take the antibiotic as directed. Return for worsening symptoms.

## 2017-05-05 NOTE — ED Provider Notes (Signed)
[facility] Emergency Department Provider Note ____________________________________________  Time seen: 1537  I have reviewed the triage vital signs and the nursing notes.  HISTORY  Chief Complaint  Abscess  HPI Daniel Craig is a 26 y.o. male presents to the ED for evaluation of a tender spot to the genital area. He is not sure what caused the sore. He noted it about 4 days ago. He lives in a group home and the caretaker looked at it yesterday. He denies fevers, chills, sweats, or dysuria. He denies a history of previous or recurrentabscesses or boils.   Past Medical History:  Diagnosis Date  . Schizophrenia (HCC)   . Seizures (HCC)     There are no active problems to display for this patient.   Past Surgical History:  Procedure Laterality Date  . MOUTH SURGERY      Prior to Admission medications   Medication Sig Start Date End Date Taking? Authorizing Provider  cephALEXin (KEFLEX) 500 MG capsule Take 1 capsule (500 mg total) by mouth 3 (three) times daily. 05/05/17   Charisma Charlot, Charlesetta IvoryJenise V Bacon, PA-C  divalproex (DEPAKOTE) 500 MG DR tablet Take 500 mg by mouth 2 (two) times daily.    [provider]  lamoTRIgine (LAMICTAL) 100 MG tablet Take 100 mg by mouth daily.    [provider]  lamoTRIgine (LAMICTAL) 200 MG tablet Take 200 mg by mouth daily.    [provider]  LORazepam (ATIVAN) 1 MG tablet Take 1 tablet (1 mg total) by mouth 2 (two) times daily as needed for seizure (or shaking). 01/06/17   Jennye MoccasinQuigley, Brian S, MD  OLANZapine (ZYPREXA) 10 MG tablet Take 10 mg by mouth at bedtime.    [provider]  risperiDONE (RISPERDAL) 3 MG tablet Take 3 mg by mouth at bedtime.    [provider]    Allergies Keppra [levetiracetam]; Trileptal [oxcarbazepine]; and Vimpat [lacosamide]  No family history on file.  Social History Social History  Substance Use Topics  . Smoking status: Current Every Day Smoker   Packs/day: 1.00    Types: Cigarettes  . Smokeless tobacco: Never Used  . Alcohol use No    Review of Systems  Constitutional: Negative for fever. Cardiovascular: Negative for chest pain. Respiratory: Negative for shortness of breath. Gastrointestinal: Negative for abdominal pain, vomiting and diarrhea. Genitourinary: Negative for dysuria. Skin: Negative for rash. Tender abscess to the genital area.  ____________________________________________  PHYSICAL EXAM:  VITAL SIGNS: ED Triage Vitals  Enc Vitals Group     BP 05/05/17 1447 128/81     Pulse Rate 05/05/17 1447 86     Resp 05/05/17 1447 16     Temp 05/05/17 1447 98.1 F (36.7 C)     Temp Source 05/05/17 1447 Oral     SpO2 05/05/17 1447 98 %     Weight 05/05/17 1444 230 lb (104.3 kg)     Height 05/05/17 1444 6\' 1"  (1.854 m)     Head Circumference --      Peak Flow --      Pain Score 05/05/17 1442 3     Pain Loc --      Pain Edu? --      Excl. in GC? --     Constitutional: Alert and oriented. Well appearing and in no distress. Head: Normocephalic and atraumatic. Hematological/Lymphatic/Immunological: No inguinal lymphadenopathy. Cardiovascular: Normal rate, regular rhythm. Normal distal pulses. Respiratory: Normal respiratory effort. No wheezes/rales/rhonchi. Gastrointestinal: Soft and nontender. No distention. GU: Normal external  genitalia. Single, superficial, shallow, ulceration to the central perineum with scant seropurulent drainage. No local erythema, induration, or fluctuance. Neurologic:  Normal gait without ataxia. Normal speech and language. No gross focal neurologic deficits are appreciated. Skin:  Skin is warm, dry and intact. No rash noted. ____________________________________________  PROCEDURES  Keflex 500 mg PO Bactroban ointment ____________________________________________  INITIAL IMPRESSION / ASSESSMENT AND PLAN / ED COURSE  Patient with a small, superficial spontaneously draining perineal  abscess without indication for I& D procedure. He is discharged with wound care instructions and a prescription for Keflex. He will follow-up with his provider in 3-5 days as needed. Return as needed for worsening symptoms.  ____________________________________________  FINAL CLINICAL IMPRESSION(S) / ED DIAGNOSES  Final diagnoses:  Abscess      Karmen Stabs, Charlesetta Ivory, PA-C 05/05/17 1709    Myrna Blazer, MD 05/05/17 2351

## 2017-05-05 NOTE — ED Notes (Signed)

## 2017-05-05 NOTE — ED Triage Notes (Signed)
Patient presents to the ED with an abscess to his groin area since Wednesday.  Patient's family states, "It looks like there is a hole in the middle."  Patient lives in a group home.

## 2017-06-25 ENCOUNTER — Encounter: Payer: Self-pay | Admitting: Emergency Medicine

## 2017-06-25 ENCOUNTER — Emergency Department
Admission: EM | Admit: 2017-06-25 | Discharge: 2017-06-26 | Disposition: A | Discharge status: Home / Self Care | Payer: Medicaid Other | Attending: Emergency Medicine | Admitting: Emergency Medicine

## 2017-06-25 DIAGNOSIS — F209 Schizophrenia, unspecified: Secondary | ICD-10-CM | POA: Insufficient documentation

## 2017-06-25 DIAGNOSIS — Z79899 Other long term (current) drug therapy: Secondary | ICD-10-CM | POA: Diagnosis not present

## 2017-06-25 DIAGNOSIS — R451 Restlessness and agitation: Secondary | ICD-10-CM | POA: Diagnosis not present

## 2017-06-25 DIAGNOSIS — F17210 Nicotine dependence, cigarettes, uncomplicated: Secondary | ICD-10-CM | POA: Insufficient documentation

## 2017-06-25 DIAGNOSIS — R45851 Suicidal ideations: Secondary | ICD-10-CM | POA: Diagnosis not present

## 2017-06-25 DIAGNOSIS — Z046 Encounter for general psychiatric examination, requested by authority: Secondary | ICD-10-CM | POA: Diagnosis present

## 2017-06-25 DIAGNOSIS — R569 Unspecified convulsions: Secondary | ICD-10-CM | POA: Diagnosis not present

## 2017-06-25 DIAGNOSIS — R410 Disorientation, unspecified: Secondary | ICD-10-CM

## 2017-06-25 LAB — COMPREHENSIVE METABOLIC PANEL
ALK PHOS: 34 U/L — AB (ref 38–126)
ALT: 23 U/L (ref 17–63)
AST: 36 U/L (ref 15–41)
Albumin: 4.1 g/dL (ref 3.5–5.0)
Anion gap: 7 (ref 5–15)
BUN: 10 mg/dL (ref 6–20)
CALCIUM: 9.4 mg/dL (ref 8.9–10.3)
CHLORIDE: 102 mmol/L (ref 101–111)
CO2: 26 mmol/L (ref 22–32)
CREATININE: 1.03 mg/dL (ref 0.61–1.24)
GFR calc non Af Amer: >60 mL/min (ref >60)
GLUCOSE: 79 mg/dL (ref 65–99)
Potassium: 4.4 mmol/L (ref 3.5–5.1)
SODIUM: 135 mmol/L (ref 135–145)
Total Bilirubin: 0.6 mg/dL (ref 0.3–1.2)
Total Protein: 7 g/dL (ref 6.5–8.1)

## 2017-06-25 LAB — CBC
HEMATOCRIT: 41.3 % (ref 40.0–52.0)
HEMOGLOBIN: 14.5 g/dL (ref 13.0–18.0)
MCH: 32.8 pg (ref 26.0–34.0)
MCHC: 35.1 g/dL (ref 32.0–36.0)
MCV: 93.4 fL (ref 80.0–100.0)
Platelets: 114 10*3/uL — ABNORMAL LOW (ref 150–440)
RBC: 4.42 MIL/uL (ref 4.40–5.90)
RDW: 13.1 % (ref 11.5–14.5)
WBC: 7.1 10*3/uL (ref 3.8–10.6)

## 2017-06-25 LAB — URINE DRUG SCREEN, QUALITATIVE (ARMC ONLY)
Amphetamines, Ur Screen: NOT DETECTED
BARBITURATES, UR SCREEN: NOT DETECTED
Benzodiazepine, Ur Scrn: NOT DETECTED
CANNABINOID 50 NG, UR ~~LOC~~: NOT DETECTED
COCAINE METABOLITE, UR ~~LOC~~: NOT DETECTED
MDMA (ECSTASY) UR SCREEN: NOT DETECTED
Methadone Scn, Ur: NOT DETECTED
OPIATE, UR SCREEN: NOT DETECTED
Phencyclidine (PCP) Ur S: NOT DETECTED
TRICYCLIC, UR SCREEN: NOT DETECTED

## 2017-06-25 LAB — ETHANOL: Alcohol, Ethyl (B): <5 mg/dL (ref <5)

## 2017-06-25 LAB — SALICYLATE LEVEL

## 2017-06-25 LAB — ACETAMINOPHEN LEVEL: Acetaminophen (Tylenol), Serum: <10 ug/mL — ABNORMAL LOW (ref 10–30)

## 2017-06-25 MED ORDER — LORAZEPAM 2 MG/ML IJ SOLN
INTRAMUSCULAR | Status: AC
  Administered 2017-06-25: 2 mg via INTRAMUSCULAR
  Filled 2017-06-25: qty 1

## 2017-06-25 MED ORDER — OLANZAPINE 10 MG PO TABS
10.0000 mg | ORAL_TABLET | Freq: Every day | ORAL | Status: DC
  Administered 2017-06-25: 10 mg via ORAL
  Filled 2017-06-25: qty 1

## 2017-06-25 MED ORDER — DIVALPROEX SODIUM 500 MG PO DR TAB
500.0000 mg | DELAYED_RELEASE_TABLET | Freq: Two times a day (BID) | ORAL | Status: DC
  Administered 2017-06-25 – 2017-06-26 (×2): 500 mg via ORAL
  Filled 2017-06-25 (×2): qty 1

## 2017-06-25 MED ORDER — RISPERIDONE 2 MG PO TBDP
3.0000 mg | ORAL_TABLET | Freq: Every day | ORAL | Status: DC
  Administered 2017-06-25: 3 mg via ORAL
  Filled 2017-06-25: qty 1
  Filled 2017-06-25: qty 6

## 2017-06-25 MED ORDER — DIPHENHYDRAMINE HCL 50 MG/ML IJ SOLN
INTRAMUSCULAR | Status: AC
  Administered 2017-06-25: 50 mg via INTRAMUSCULAR
  Filled 2017-06-25: qty 1

## 2017-06-25 MED ORDER — LORAZEPAM 2 MG/ML IJ SOLN
2.0000 mg | Freq: Once | INTRAMUSCULAR | Status: AC
  Administered 2017-06-25: 2 mg via INTRAMUSCULAR

## 2017-06-25 MED ORDER — LAMOTRIGINE 100 MG PO TABS
300.0000 mg | ORAL_TABLET | Freq: Every day | ORAL | Status: DC
  Administered 2017-06-25 – 2017-06-26 (×2): 300 mg via ORAL
  Filled 2017-06-25 (×2): qty 3

## 2017-06-25 MED ORDER — HALOPERIDOL LACTATE 5 MG/ML IJ SOLN
INTRAMUSCULAR | Status: AC
  Administered 2017-06-25: 5 mg via INTRAMUSCULAR
  Filled 2017-06-25: qty 1

## 2017-06-25 MED ORDER — DIPHENHYDRAMINE HCL 50 MG/ML IJ SOLN
50.0000 mg | Freq: Once | INTRAMUSCULAR | Status: AC
  Administered 2017-06-25: 50 mg via INTRAMUSCULAR

## 2017-06-25 MED ORDER — HALOPERIDOL LACTATE 5 MG/ML IJ SOLN
5.0000 mg | Freq: Once | INTRAMUSCULAR | Status: AC
  Administered 2017-06-25: 5 mg via INTRAMUSCULAR

## 2017-06-25 NOTE — ED Notes (Signed)
[facility] attempted by this tech and was unable to obtain blood. RN tried right hand and didn't get any results. Lab called and is in pt room at this time drawing blood. Pt has been changed into wine colored scrubs and personal belongings placed in bag consisting of shorts, underwear, socks, T-shirt and undershirt. These items placed in bag labeled 2 of 2 and attached to bag labeled 1 of 2. Pt was given supper tray and is being cooperative at this time.

## 2017-06-25 NOTE — ED Notes (Signed)
Patient attempting to leave at this time. Dr. Derrill KayGoodman states he is going to IVC patient. Patient unable to redirect. See orders.

## 2017-06-25 NOTE — ED Notes (Signed)
Patient resting with even and non labored respirations. Patient no longer crying or shaking at this time. Patient cooperative.

## 2017-06-25 NOTE — ED Notes (Signed)
Patient assigned to appropriate care area. Patient oriented to unit/care area: Informed that, for their safety, care areas are designed for safety and monitored by security cameras at all times; and visiting hours explained to patient. Patient verbalizes understanding, and verbal contract for safety obtained. 

## 2017-06-25 NOTE — ED Notes (Signed)
Staff from group home called. She provided correct passcode and was updated on plan of care. States she will call back tomorrow to check on patient.

## 2017-06-25 NOTE — ED Triage Notes (Signed)
Patient presents to ED for "seizures" per family. Patient came via POV. Group home brought him due to SI. Patient states, "I told them I didn't want to come to the hospital and that if they were going to take me I was going to jump out of the car". Patient hysterical in triage, crying, sobbing, repetitive speech. "I don't need to be here. Ya'll arnt sticking me with needles". Patient denies SI or HI at this time.

## 2017-06-25 NOTE — ED Notes (Signed)
PT IVC/ PENDING PLACEMENT  

## 2017-06-25 NOTE — ED Notes (Signed)
Pt shaking and tearful upon arrival. Pt lying head on officer DavieMeadows and officer Carole BinningMeadows is consoling the pt. Pt states he does not need to be in the hospital and not to put a needle in him. Pt given meds and is now resting with brightest light off and warm blanket given. Removed and placed in belongings bag 1 of 2 are pt shoes, blanket, cigarettes, lighter and wallet. Pt currently has personal clothing on. Empty bag labeled 2 of 2 is for pt personal clothing.

## 2017-06-25 NOTE — ED Provider Notes (Signed)
[facility] Emergency Department Provider Note   ____________________________________________   I have reviewed the triage vital signs and the nursing notes.   HISTORY  Chief Complaint Psychiatric Evaluation   History limited by: Altered Mental Status   HPI Daniel Craig is a 26 y.o. male who presents to the emergency department today by his group home because of concerns for seizure. When triage nurse all the patient was shaking. She asked that this is what the group home had observed invasive yes. Patient however was awake and alert and talking during this shaking. In addition group home states that the patient stated he wanted to hurt himself. Patient cannot give much more significant history. When asked about thoughts about hurting himself he nods yes. He states he does not want to be in the hospital.    Past Medical History:  Diagnosis Date  . Schizophrenia (HCC)   . Seizures (HCC)     There are no active problems to display for this patient.   Past Surgical History:  Procedure Laterality Date  . MOUTH SURGERY      Prior to Admission medications   Medication Sig Start Date End Date Taking? Authorizing Provider  cephALEXin (KEFLEX) 500 MG capsule Take 1 capsule (500 mg total) by mouth 3 (three) times daily. 05/05/17   Menshew, Charlesetta IvoryJenise V Bacon, PA-C  divalproex (DEPAKOTE) 500 MG DR tablet Take 500 mg by mouth 2 (two) times daily.    [provider]  lamoTRIgine (LAMICTAL) 100 MG tablet Take 100 mg by mouth daily.    [provider]  lamoTRIgine (LAMICTAL) 200 MG tablet Take 200 mg by mouth daily.    [provider]  LORazepam (ATIVAN) 1 MG tablet Take 1 tablet (1 mg total) by mouth 2 (two) times daily as needed for seizure (or shaking). 01/06/17   Jennye MoccasinQuigley, Brian S, MD  OLANZapine (ZYPREXA) 10 MG tablet Take 10 mg by mouth at bedtime.    [provider]  risperiDONE (RISPERDAL) 3 MG tablet Take 3 mg by mouth at  bedtime.    [provider]    Allergies Keppra [levetiracetam]; Trileptal [oxcarbazepine]; and Vimpat [lacosamide]  No family history on file.  Social History Social History  Substance Use Topics  . Smoking status: Current Every Day Smoker    Packs/day: 1.00    Types: Cigarettes  . Smokeless tobacco: Never Used  . Alcohol use No    Review of Systems Constitutional: No fever/chills Eyes: No visual changes. ENT: No sore throat. Cardiovascular: Denies chest pain. Respiratory: Denies shortness of breath. Gastrointestinal: No abdominal pain.  No nausea, no vomiting.  No diarrhea.   Genitourinary: Negative for dysuria. Musculoskeletal: Negative for back pain. Skin: Negative for rash. Neurological: Negative for headaches, focal weakness or numbness.  ____________________________________________   PHYSICAL EXAM:  VITAL SIGNS: ED Triage Vitals [06/25/17 1554]  Enc Vitals Group     BP 133/68     Pulse Rate 98     Resp (!) 30     Temp 98.4 F (36.9 C)     Temp src      SpO2 98 %     Weight 230 lb (104.3 kg)     Height 6' (1.829 m)   Constitutional: Awake, agitated, shaking.  Eyes: Conjunctivae are normal.  ENT   Head: Normocephalic and atraumatic.   Nose: No congestion/rhinnorhea.   Mouth/Throat: Mucous membranes are moist.   Neck: No stridor. Hematological/Lymphatic/Immunilogical: No cervical lymphadenopathy. Cardiovascular: Normal rate, regular rhythm.  No  murmurs, rubs, or gallops.  Respiratory: Normal respiratory effort without tachypnea nor retractions. Breath sounds are clear and equal bilaterally. No wheezes/rales/rhonchi. Gastrointestinal: Soft and non tender. No rebound. No guarding.  Genitourinary: Deferred Musculoskeletal: Normal range of motion in all extremities. No lower extremity edema. Neurologic:  Awake, alert, moves all extremities. Shaking. No gross focal neurologic deficits are appreciated.  Skin:  Skin is warm, dry and  intact. No rash noted. Psychiatric: Agitated, upset, nods yes when asked about SI.   ____________________________________________    LABS (pertinent positives/negatives)  Labs Reviewed  COMPREHENSIVE METABOLIC PANEL - Abnormal; Notable for the following:       Result Value   Alkaline Phosphatase 34 (*)    All other components within normal limits  ACETAMINOPHEN LEVEL - Abnormal; Notable for the following:    Acetaminophen (Tylenol), Serum <10 (*)    All other components within normal limits  CBC - Abnormal; Notable for the following:    Platelets 114 (*)    All other components within normal limits  ETHANOL  SALICYLATE LEVEL  URINE DRUG SCREEN, QUALITATIVE ([facility] ONLY)     ____________________________________________   EKG  None  ____________________________________________    RADIOLOGY  None  ____________________________________________   PROCEDURES  Procedures  ____________________________________________   INITIAL IMPRESSION / ASSESSMENT AND PLAN / ED COURSE  Pertinent labs & imaging results that were available during my care of the patient were reviewed by me and considered in my medical decision making (see chart for details).  Patient presented to the emergency department today brought in by group home because of concerns for possible seizure activity and suicidal ideation. Whilst the patient was shaking he was certainly awake and talking. I do not think this is a true seizure at this time. However he did not yes when asked about suicidal ideation. It is quite clear the patient was agitated. Shortly after my evaluation he started to become more agitated and confrontational with staff. He was given medications to try to make him less agitated and upset. This did help calm the patient. He was seen by psychiatry would like to observe him and reassess.  ____________________________________________   FINAL CLINICAL IMPRESSION(S) / ED DIAGNOSES  Final  diagnoses:  Agitation     Note: This dictation was prepared with Dragon dictation. Any transcriptional errors that result from this process are unintentional     Phineas SemenGoodman, Pepper Wyndham, MD 06/25/17 2344

## 2017-06-25 NOTE — ED Notes (Signed)
Patient not dressed out at this time. Patient refusing to dress out at this time. ODS officer asked to wand patient for patient and staff safety.

## 2017-06-25 NOTE — ED Notes (Signed)
Pt is a resident at [facility] [phone]). Birder RobsonJean Majors owns group home. House manager Genevie Cheshire(Billy Effie ShyColeman) is who will need to be reached for pickup [phone]). Pt's legal guardians are his parents, Christen BameRonnie and Jacquelynn Creengela Coleman (ronnie: [phone]). All this information provided by group home worker Allayne Gitelman(Christine Jennings). Passcode of [phone] given to group home worker.

## 2017-06-25 NOTE — Consult Note (Signed)
[facility] Face-to-Face Psychiatry Consult   Reason for Consult:  Consult for 26 year old man with a history of schizophrenia and possible seizure disorder brought to the emergency room with agitation Referring Physician:  Derrill Kay Patient Identification: Daniel Craig MRN:  [id number] Principal Diagnosis: Delirium Diagnosis:   Patient Active Problem List   Diagnosis Date Noted  . Delirium [R41.0] 06/25/2017  . Schizophrenia (HCC) [F20.9] 06/25/2017  . Seizures (HCC) [R56.9] 06/25/2017    Total Time spent with patient: 1 hour  Subjective:   Daniel Craig is a 26 y.o. male patient admitted with "I'm alright".  HPI:  Patient interviewed chart reviewed. I reviewed his chart here at our hospital and also many of the notes from his treatment through [facility]. Spoke with TTS and emergency room physician. I tried to reach his father by telephone but could only leave a voicemail. This is a 26 year old man with a history of a diagnosis of schizophrenia who was brought here by his group home in an agitated state. The details of what led to his being brought to the emergency room are still unclear to me. I couldn't reach anyone at the group home. There were reports made that the patient had made suicidal threats. He was agitated and somewhat combative when he was first brought into the emergency room. He was given medication and by the time I saw him was much more sedated. He was able to tell me that he remembers making suicidal statements but that he had no intention or plan of hurting himself. He tells me that he only said that because he didn't want them to bring him to the hospital because he hates being at the hospital. He cannot remember whether he has been having seizures recently. He says that he has been taking all of his medicine and has been compliant with it. Denies any substance abuse. Currently denies any hallucinations. Denies suicidal or homicidal ideation.  Social history: Patient apparently is living in  a group home. It sounds like this is a relatively new situation. Notes indicate that within the last year he was still living with his parents. At least one note indicates that his father is his legal guardian.  Medical history: Patient has a diagnosis of seizures. He sees a Insurance account manager through [facility]. 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 I am not clear whether there is a definitive diagnosis of true epileptic seizures. In any case he is maintained on anticonvulsant medicine.  Substance abuse history: No evidence of acute substance abuse no mention in any of the old notes of substance abuse issues.  Past Psychiatric History: Patient carries a diagnosis of schizophrenia. He has been described in prior notes as having hallucinations and delusions. He is currently taking both Zyprexa and risperidone. I see that he has had at least 2 prior psychiatric hospitalizations in the last 2 years both of them at [facility]. At least one of them included references to suicidality although it's not clear if he seriously tried to hurt himself. The current Zyprexa and Risperdal seems to be a bit of a compromise. Consideration had been given to starting clozapine at one point but it was not done because of the risk of worsening the seizure disorder. He appears to get his outpatient care through the [facility] system.  Risk to Self: Is patient at risk for suicide?: Yes Risk to Others:   Prior Inpatient Therapy:   Prior Outpatient Therapy:    Past  Medical History:  Past Medical History:  Diagnosis Date  . Schizophrenia (HCC)   . Seizures (HCC)     Past Surgical History:  Procedure Laterality Date  . MOUTH SURGERY     Family History: No family history on file. Family Psychiatric  History: No information provided nothing known Social History:  History  Alcohol Use No     History  Drug use: Unknown    Social History   Social History  . Marital  status: Single    Spouse name: N/A  . Number of children: N/A  . Years of education: N/A   Social History Main Topics  . Smoking status: Current Every Day Smoker    Packs/day: 1.00    Types: Cigarettes  . Smokeless tobacco: Never Used  . Alcohol use No  . Drug use: Unknown  . Sexual activity: Not Asked   Other Topics Concern  . None   Social History Narrative  . None   Additional Social History:    Allergies:   Allergies  Allergen Reactions  . Keppra [Levetiracetam]   . Trileptal [Oxcarbazepine]   . Vimpat [Lacosamide]     Labs: No results found for this or any previous visit (from the past 48 hour(s)).  Current Facility-Administered Medications  Medication Dose Route Frequency Provider Last Rate Last Dose  . divalproex (DEPAKOTE) DR tablet 500 mg  500 mg Oral Q12H Amyiah Gaba T, MD      . lamoTRIgine (LAMICTAL) tablet 300 mg  300 mg Oral Daily Anacleto Batterman T, MD      . OLANZapine (ZYPREXA) tablet 10 mg  10 mg Oral QHS Abbygael Curtiss T, MD      . risperiDONE (RISPERDAL M-TABS) disintegrating tablet 3 mg  3 mg Oral QHS Alycen Mack, Jackquline DenmarkJohn T, MD       Current Outpatient Prescriptions  Medication Sig Dispense Refill  . cephALEXin (KEFLEX) 500 MG capsule Take 1 capsule (500 mg total) by mouth 3 (three) times daily. 21 capsule 0  . divalproex (DEPAKOTE) 500 MG DR tablet Take 500 mg by mouth 2 (two) times daily.    Marland Kitchen. lamoTRIgine (LAMICTAL) 100 MG tablet Take 100 mg by mouth daily.    Marland Kitchen. lamoTRIgine (LAMICTAL) 200 MG tablet Take 200 mg by mouth daily.    Marland Kitchen. LORazepam (ATIVAN) 1 MG tablet Take 1 tablet (1 mg total) by mouth 2 (two) times daily as needed for seizure (or shaking). 10 tablet 0  . OLANZapine (ZYPREXA) 10 MG tablet Take 10 mg by mouth at bedtime.    . risperiDONE (RISPERDAL) 3 MG tablet Take 3 mg by mouth at bedtime.      Musculoskeletal: Strength & Muscle Tone: within normal limits Gait & Station: unable to stand Patient leans: N/A  Psychiatric Specialty  Exam: Physical Exam  Nursing note and vitals reviewed. Constitutional: He appears well-developed and well-nourished.  HENT:  Head: Normocephalic and atraumatic.  Eyes: Pupils are equal, round, and reactive to light. Conjunctivae are normal.  Neck: Normal range of motion.  Cardiovascular: Regular rhythm and normal heart sounds.   Respiratory: Effort normal. No respiratory distress.  GI: Soft.  Musculoskeletal: Normal range of motion.  Neurological: No cranial nerve deficit.  Skin: Skin is warm and dry.  Psychiatric: His affect is blunt. His speech is delayed. He is slowed. He expresses no homicidal and no suicidal ideation. He exhibits abnormal recent memory.    Review of Systems  Constitutional: Negative.   HENT: Negative.   Eyes: Negative.   Respiratory:  Negative.   Cardiovascular: Negative.   Gastrointestinal: Negative.   Musculoskeletal: Negative.   Skin: Negative.   Neurological: Negative.   Psychiatric/Behavioral: Positive for memory loss. Negative for depression, hallucinations, substance abuse and suicidal ideas. The patient is not nervous/anxious and does not have insomnia.     Blood pressure 133/68, pulse 98, temperature 98.4 F (36.9 C), resp. rate (!) 30, height 6' (1.829 m), weight 230 lb (104.3 kg), SpO2 98 %.Body mass index is 31.19 kg/m.  General Appearance: Fairly Groomed  Eye Contact:  Minimal  Speech:  Slow  Volume:  Decreased  Mood:  Euthymic  Affect:  Constricted  Thought Process:  Goal Directed  Orientation:  Other:  He knew he was in the hospital.The correct year. was vague about the actual situation   Thought Content:  Concrete. Minimal elaboration.  Suicidal Thoughts:  No  Homicidal Thoughts:  No  Memory:  Immediate;   Fair Recent;   Poor Remote;   Fair  Judgement:  Impaired  Insight:  Shallow  Psychomotor Activity:  Decreased  Concentration:  Concentration: Poor  Recall:  FiservFair  Fund of Knowledge:  Fair  Language:  Fair  Akathisia:  No   Handed:  Right  AIMS (if indicated):     Assets:  Housing Social Support  ADL's:  Impaired  Cognition:  Impaired,  Mild  Sleep:        Treatment Plan Summary: Daily contact with patient to assess and evaluate symptoms and progress in treatment, Medication management and Plan This is a 26 year old man with a history of schizophrenia and seizure disorder brought into the hospital agitated. Unclear whether he had had a true or possibly a pseudoseizure or possibly both prior to admission. By the time I saw him he had been given shots of Haldol Benadryl and Ativan for his agitation and was much calmer although somewhat sedated. At this point it is unclear whether he will need psychiatric hospitalization. I have put in orders to make sure he continues his Depakote lamotrigine Zyprexa and Risperdal. I have made the Zyprexa and risperidone oral dissolving tablets for now since he is somewhat sedated. I will follow up later as he wakes up. Case reviewed with TTS. Case reviewed with emergency room physician. Continue IVC for now.  Disposition: Supportive therapy provided about ongoing stressors.  Mordecai RasmussenJohn Kimimila Tauzin, MD 06/25/2017 5:39 PM

## 2017-06-25 NOTE — ED Notes (Signed)
Attempted blood draw x1. Pam, ED tech attempted x1. Both unsuccessful. Lab called and notified of need for phlebotomist to come try. States they will send someone.

## 2017-06-25 NOTE — ED Notes (Signed)
Lab at bedside to draw blood.

## 2017-06-25 NOTE — ED Notes (Signed)
Pt spoke with Dr. Toni Amendlapacs and was given a warm blanket and urine was collected from pt and sent to lab. Pt is sleeping, but was informed that we would have to change clothes and get labs. Pt rolled his eyes and rolled over.

## 2017-06-26 DIAGNOSIS — F209 Schizophrenia, unspecified: Secondary | ICD-10-CM | POA: Diagnosis not present

## 2017-06-26 MED ORDER — RISPERIDONE 1 MG PO TBDP
3.0000 mg | ORAL_TABLET | Freq: Every day | ORAL | Status: DC
  Filled 2017-06-26: qty 3

## 2017-06-26 NOTE — ED Notes (Signed)
Pt given meal tray. This Neurosurgeontech and security officer [facility].

## 2017-06-26 NOTE — Discharge Instructions (Signed)
Please follow-up with your psychiatrist as an outpatient and return to the emergency department for any concerns.  It was a pleasure to take care of you today, and thank you for coming to our emergency department.  If you have any questions or concerns before leaving please ask the nurse to grab me and I'm more than happy to go through your aftercare instructions again.  If you were prescribed any opioid pain medication today such as Norco, Vicodin, Percocet, morphine, hydrocodone, or oxycodone please make sure you do not drive when you are taking this medication as it can alter your ability to drive safely.  If you have any concerns once you are home that you are not improving or are in fact getting worse before you can make it to your follow-up appointment, please do not hesitate to call 911 and come back for further evaluation.  Merrily BrittleNeil Shyne Resch, MD  Results for orders placed or performed during the hospital encounter of 06/25/17  Comprehensive metabolic panel  Result Value Ref Range   Sodium 135 135 - 145 mmol/L   Potassium 4.4 3.5 - 5.1 mmol/L   Chloride 102 101 - 111 mmol/L   CO2 26 22 - 32 mmol/L   Glucose, Bld 79 65 - 99 mg/dL   BUN 10 6 - 20 mg/dL   Creatinine, Ser 1.611.03 0.61 - 1.24 mg/dL   Calcium 9.4 8.9 - 09.610.3 mg/dL   Total Protein 7.0 6.5 - 8.1 g/dL   Albumin 4.1 3.5 - 5.0 g/dL   AST 36 15 - 41 U/L   ALT 23 17 - 63 U/L   Alkaline Phosphatase 34 (L) 38 - 126 U/L   Total Bilirubin 0.6 0.3 - 1.2 mg/dL   GFR calc non Af Amer >60 >60 mL/min   GFR calc Af Amer >60 >60 mL/min   Anion gap 7 5 - 15  Ethanol  Result Value Ref Range   Alcohol, Ethyl (B) <5 <5 mg/dL  Salicylate level  Result Value Ref Range   Salicylate Lvl <7.0 2.8 - 30.0 mg/dL  Acetaminophen level  Result Value Ref Range   Acetaminophen (Tylenol), Serum <10 (L) 10 - 30 ug/mL  cbc  Result Value Ref Range   WBC 7.1 3.8 - 10.6 K/uL   RBC 4.42 4.40 - 5.90 MIL/uL   Hemoglobin 14.5 13.0 - 18.0 g/dL   HCT 04.541.3  40.940.0 - 81.152.0 %   MCV 93.4 80.0 - 100.0 fL   MCH 32.8 26.0 - 34.0 pg   MCHC 35.1 32.0 - 36.0 g/dL   RDW 91.413.1 78.211.5 - 95.614.5 %   Platelets 114 (L) 150 - 440 K/uL  Urine Drug Screen, Qualitative  Result Value Ref Range   Tricyclic, Ur Screen NONE DETECTED NONE DETECTED   Amphetamines, Ur Screen NONE DETECTED NONE DETECTED   MDMA (Ecstasy)Ur Screen NONE DETECTED NONE DETECTED   Cocaine Metabolite,Ur [place] NONE DETECTED NONE DETECTED   Opiate, Ur Screen NONE DETECTED NONE DETECTED   Phencyclidine (PCP) Ur S NONE DETECTED NONE DETECTED   Cannabinoid 50 Ng, Ur [place] NONE DETECTED NONE DETECTED   Barbiturates, Ur Screen NONE DETECTED NONE DETECTED   Benzodiazepine, Ur Scrn NONE DETECTED NONE DETECTED   Methadone Scn, Ur NONE DETECTED NONE DETECTED

## 2017-06-26 NOTE — ED Notes (Signed)
BEHAVIORAL HEALTH ROUNDING Patient sleeping: Yes.   Patient alert and oriented: eyes closed  Appears to be asleep Behavior appropriate: Yes.  ; If no, describe:  Nutrition and fluids offered: Yes  Toileting and hygiene offered: sleeping Sitter present: q 15 minute observations and security monitoring Law enforcement present: yes  ODS 

## 2017-06-26 NOTE — ED Notes (Signed)

## 2017-06-26 NOTE — ED Notes (Signed)
ED  Is the patient under IVC or is there intent for IVC: Yes.   Is the patient medically cleared: Yes.   Is there vacancy in the ED BHU: Yes.   Is the population mix appropriate for patient: Yes.   Is the patient awaiting placement in inpatient or outpatient setting:  Has the patient had a psychiatric consult:  Consult pending    Survey of unit performed for contraband, proper placement and condition of furniture, tampering with fixtures in bathroom, shower, and each patient room: Yes.  ; Findings:  APPEARANCE/BEHAVIOR Calm and cooperative NEURO ASSESSMENT Orientation: oriented to self place time and situation    Denies pain Hallucinations: No  Denies .None noted (Hallucinations) Speech: Normal Gait: normal RESPIRATORY ASSESSMENT Even  Unlabored respirations  CARDIOVASCULAR ASSESSMENT Pulses equal   regular rate  Skin warm and dry   GASTROINTESTINAL ASSESSMENT no GI complaint EXTREMITIES Full ROM  PLAN OF CARE Provide calm/safe environment. Vital signs assessed twice daily. ED BHU Assessment once each 12-hour shift. Collaborate with TTS daily or as condition indicates. Assure the ED provider has rounded once each shift. Provide and encourage hygiene. Provide redirection as needed. Assess for escalating behavior; address immediately and inform ED provider.  Assess family dynamic and appropriateness for visitation as needed: Yes.  ; If necessary, describe findings:  Educate the patient/family about BHU procedures/visitation: Yes.  ; If necessary, describe findings:

## 2017-06-26 NOTE — ED Notes (Signed)
Patient observed lying in bed with eyes closed  Even, unlabored respirations observed   NAD pt appears to be sleeping  I will continue to monitor along with every 15 minute visual observations and ongoing security monitoring    

## 2017-06-26 NOTE — ED Notes (Signed)
Pt sleeping. Breakfast tray placed at bedside 

## 2017-06-26 NOTE — ED Notes (Signed)
BEHAVIORAL HEALTH ROUNDING Patient sleeping: No. Patient alert and oriented: yes Behavior appropriate: Yes.  ; If no, describe:  Nutrition and fluids offered: yes Toileting and hygiene offered: Yes  Sitter present: q15 minute observations and security  monitoring Law enforcement present: Yes  ODS  

## 2017-06-26 NOTE — ED Notes (Signed)
Am meds administered as ordered   Assessment completed  No verbalized needs or concerns at this time  Psych consult pending

## 2017-06-26 NOTE — BH Assessment (Signed)
Clinician consulted with Dr.Clapacs and pt is recommended for discharge.

## 2017-06-26 NOTE — ED Notes (Addendum)
Nazar denied SI, HI, and AVH. He has been pleasant, calm, and cooperative [facility], with no complaints of pain or any distress. This Clinical research associatewriter spoke with a group home representative who said someone would be on their way to pick up patient "as soon as possible."

## 2017-06-26 NOTE — ED Provider Notes (Addendum)
-----------------------------------------   7:59 AM on 06/26/2017 -----------------------------------------   Blood pressure 106/65, pulse 66, temperature 98.2 F (36.8 C), temperature source Oral, resp. rate 16, height 6' (1.829 m), weight 104.3 kg (230 lb), SpO2 97 %.  The patient had no acute events since last update.  Calm and cooperative at this time.  Disposition is pending Psychiatry/Behavioral Medicine team recommendations.     Merrily Brittleifenbark, Markie Heffernan, MD 06/26/17 [phone]  Per Dr. Toni Amendlapacs no indication for IVC and ok for discharge.   Merrily Brittleifenbark, Zaniya Mcaulay, MD 06/26/17 1402

## 2017-06-26 NOTE — ED Notes (Signed)
He has ambulated to the trash can and went back to his room - standing in the doorway - looking around  NAD observed  He denies pain  Oriented to area - clock pointed out for him to know the time

## 2017-06-26 NOTE — Consult Note (Signed)
[facility] Consult   Reason for Consult:  Consult for 26 year old man with a history of schizophrenia and possible seizure disorder brought to the emergency room with agitation Referring Physician:  Archie Balboa Patient Identification: Daniel Craig MRN:  [id number] Principal Diagnosis: Delirium Diagnosis:   Patient Active Problem List   Diagnosis Date Noted  . Delirium [R41.0] 06/25/2017  . Schizophrenia ([place]) [F20.9] 06/25/2017  . Seizures ([place]) [R56.9] 06/25/2017    Total Time spent with patient: 30 minutes  Subjective:   Daniel Craig is a 26 y.o. male patient admitted with "I'm alright".  This is a follow-up consult for this 26 year old man with a history of schizophrenia and seizure disorder. Patient was reevaluated today. Today he is easily arousable awake and able to have a normal conversation. Patient denies having any mood symptoms currently. Denies any suicidal or homicidal thoughts. Denies any hallucinations. He says that he feels like he has been under a lot of stress recently because he wants to be living independently in the group home stresses him out. He thinks this might of been why he was shaking yesterday. He says he has been compliant with all of his medicine. Denies any substance abuse. Patient has not been violent or aggressive since coming into the emergency room. He is completely agreeable with going back home  HPI:  Patient interviewed chart reviewed. I reviewed his chart here at our hospital and also many of the notes from his treatment through [facility]. Spoke with TTS and emergency room physician. I tried to reach his father by telephone but could only leave a voicemail. This is a 26 year old man with a history of a diagnosis of schizophrenia who was brought here by his group home in an agitated state. The details of what led to his being brought to the emergency room are still unclear to me. I couldn't reach anyone at the group home. There were reports made that  the patient had made suicidal threats. He was agitated and somewhat combative when he was first brought into the emergency room. He was given medication and by the time I saw him was much more sedated. He was able to tell me that he remembers making suicidal statements but that he had no intention or plan of hurting himself. He tells me that he only said that because he didn't want them to bring him to the hospital because he hates being at the hospital. He cannot remember whether he has been having seizures recently. He says that he has been taking all of his medicine and has been compliant with it. Denies any substance abuse. Currently denies any hallucinations. Denies suicidal or homicidal ideation.  Social history: Patient apparently is living in a group home. It sounds like this is a relatively new situation. Notes indicate that within the last year he was still living with his parents. At least one note indicates that his father is his legal guardian.  Medical history: Patient has a diagnosis of seizures. He sees a Garment/textile technologist through [facility]. 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 I am not clear whether there is a definitive diagnosis of true epileptic seizures. In any case he is maintained on anticonvulsant medicine.  Substance abuse history: No evidence of acute substance abuse no mention in any of the old notes of substance abuse issues.  Past Psychiatric History: Patient carries a diagnosis of schizophrenia. He has been described in prior notes as having hallucinations and delusions.  He is currently taking both Zyprexa and risperidone. I see that he has had at least 2 prior psychiatric hospitalizations in the last 2 years both of them at [facility]. At least one of them included references to suicidality although it's not clear if he seriously tried to hurt himself. The current Zyprexa and Risperdal seems to be a bit of a compromise.  Consideration had been given to starting clozapine at one point but it was not done because of the risk of worsening the seizure disorder. He appears to get his outpatient care through the [facility] system.  Risk to Self: Is patient at risk for suicide?: Yes Risk to Others:   Prior Inpatient Therapy:   Prior Outpatient Therapy:    Past Medical History:  Past Medical History:  Diagnosis Date  . Schizophrenia ([place])   . Seizures ([place])     Past Surgical History:  Procedure Laterality Date  . MOUTH SURGERY     Family History: No family history on file. Family Psychiatric  History: No information provided nothing known Social History:  History  Alcohol Use No     History  Drug use: Unknown    Social History   Social History  . Marital status: Single    Spouse name: N/A  . Number of children: N/A  . Years of education: N/A   Social History Main Topics  . Smoking status: Current Every Day Smoker    Packs/day: 1.00    Types: Cigarettes  . Smokeless tobacco: Never Used  . Alcohol use No  . Drug use: Unknown  . Sexual activity: Not Asked   Other Topics Concern  . None   Social History Narrative  . None   Additional Social History:    Allergies:   Allergies  Allergen Reactions  . Keppra [Levetiracetam]   . Trileptal [Oxcarbazepine]   . Vimpat [Lacosamide]     Labs:  Results for orders placed or performed during the hospital encounter of 06/25/17 (from the past 48 hour(s))  Urine Drug Screen, Qualitative     Status: None   Collection Time: 06/25/17  5:01 PM  Result Value Ref Range   Tricyclic, Ur Screen NONE DETECTED NONE DETECTED   Amphetamines, Ur Screen NONE DETECTED NONE DETECTED   MDMA (Ecstasy)Ur Screen NONE DETECTED NONE DETECTED   Cocaine Metabolite,Ur [place] NONE DETECTED NONE DETECTED   Opiate, Ur Screen NONE DETECTED NONE DETECTED   Phencyclidine (PCP) Ur S NONE DETECTED NONE DETECTED   Cannabinoid 50 Ng, Ur [place] NONE DETECTED NONE DETECTED   Barbiturates, Ur  Screen NONE DETECTED NONE DETECTED   Benzodiazepine, Ur Scrn NONE DETECTED NONE DETECTED   Methadone Scn, Ur NONE DETECTED NONE DETECTED    Comment: (NOTE) 233  Tricyclics, urine               Cutoff 1000 ng/mL 200  Amphetamines, urine             Cutoff 1000 ng/mL 300  MDMA (Ecstasy), urine           Cutoff 500 ng/mL 400  Cocaine Metabolite, urine       Cutoff 300 ng/mL 500  Opiate, urine                   Cutoff 300 ng/mL 600  Phencyclidine (PCP), urine      Cutoff 25 ng/mL 700  Cannabinoid, urine              Cutoff 50 ng/mL 800  Barbiturates,  urine             Cutoff 200 ng/mL 900  Benzodiazepine, urine           Cutoff 200 ng/mL 1000 Methadone, urine                Cutoff 300 ng/mL 1100 1200 The urine drug screen provides only a preliminary, unconfirmed 1300 analytical test result and should not be used for non-medical 1400 purposes. Clinical consideration and professional judgment should 1500 be applied to any positive drug screen result due to possible 1600 interfering substances. A more specific alternate chemical method 1700 must be used in order to obtain a confirmed analytical result.  1800 Gas chromato graphy / mass spectrometry (GC/MS) is the preferred 1900 confirmatory method.   Comprehensive metabolic panel     Status: Abnormal   Collection Time: 06/25/17  6:35 PM  Result Value Ref Range   Sodium 135 135 - 145 mmol/L   Potassium 4.4 3.5 - 5.1 mmol/L   Chloride 102 101 - 111 mmol/L   CO2 26 22 - 32 mmol/L   Glucose, Bld 79 65 - 99 mg/dL   BUN 10 6 - 20 mg/dL   Creatinine, Ser 1.03 0.61 - 1.24 mg/dL   Calcium 9.4 8.9 - 10.3 mg/dL   Total Protein 7.0 6.5 - 8.1 g/dL   Albumin 4.1 3.5 - 5.0 g/dL   AST 36 15 - 41 U/L   ALT 23 17 - 63 U/L   Alkaline Phosphatase 34 (L) 38 - 126 U/L   Total Bilirubin 0.6 0.3 - 1.2 mg/dL   GFR calc non Af Amer >60 >60 mL/min   GFR calc Af Amer >60 >60 mL/min    Comment: (NOTE) The eGFR has been calculated using the CKD EPI  equation. This calculation has not been validated in all clinical situations. eGFR's persistently <60 mL/min signify possible Chronic Kidney Disease.    Anion gap 7 5 - 15  Ethanol     Status: None   Collection Time: 06/25/17  6:35 PM  Result Value Ref Range   Alcohol, Ethyl (B) <5 <5 mg/dL    Comment:        LOWEST DETECTABLE LIMIT FOR SERUM ALCOHOL IS 5 mg/dL FOR MEDICAL PURPOSES ONLY   Salicylate level     Status: None   Collection Time: 06/25/17  6:35 PM  Result Value Ref Range   Salicylate Lvl <7.4 2.8 - 30.0 mg/dL  Acetaminophen level     Status: Abnormal   Collection Time: 06/25/17  6:35 PM  Result Value Ref Range   Acetaminophen (Tylenol), Serum <10 (L) 10 - 30 ug/mL    Comment:        THERAPEUTIC CONCENTRATIONS VARY SIGNIFICANTLY. A RANGE OF 10-30 ug/mL MAY BE AN EFFECTIVE CONCENTRATION FOR MANY PATIENTS. HOWEVER, SOME ARE BEST TREATED AT CONCENTRATIONS OUTSIDE THIS RANGE. ACETAMINOPHEN CONCENTRATIONS >150 ug/mL AT 4 HOURS AFTER INGESTION AND >50 ug/mL AT 12 HOURS AFTER INGESTION ARE OFTEN ASSOCIATED WITH TOXIC REACTIONS.   cbc     Status: Abnormal   Collection Time: 06/25/17  6:35 PM  Result Value Ref Range   WBC 7.1 3.8 - 10.6 K/uL   RBC 4.42 4.40 - 5.90 MIL/uL   Hemoglobin 14.5 13.0 - 18.0 g/dL   HCT 41.3 40.0 - 52.0 %   MCV 93.4 80.0 - 100.0 fL   MCH 32.8 26.0 - 34.0 pg   MCHC 35.1 32.0 - 36.0 g/dL   RDW 13.1 11.5 -  14.5 %   Platelets 114 (L) 150 - 440 K/uL    Current Facility-Administered Medications  Medication Dose Route Frequency Provider Last Rate Last Dose  . divalproex (DEPAKOTE) DR tablet 500 mg  500 mg Oral Q12H Gyneth Hubka T, MD   500 mg at 06/26/17 1051  . lamoTRIgine (LAMICTAL) tablet 300 mg  300 mg Oral Daily Beckett Hickmon T, MD   300 mg at 06/26/17 1051  . OLANZapine (ZYPREXA) tablet 10 mg  10 mg Oral QHS Damany Eastman, Madie Reno, MD   10 mg at 06/25/17 2245  . risperiDONE (RISPERDAL M-TABS) disintegrating tablet 3 mg  3 mg Oral QHS  Desirey Keahey, Madie Reno, MD       Current Outpatient Prescriptions  Medication Sig Dispense Refill  . cephALEXin (KEFLEX) 500 MG capsule Take 1 capsule (500 mg total) by mouth 3 (three) times daily. 21 capsule 0  . divalproex (DEPAKOTE) 500 MG DR tablet Take 500 mg by mouth 2 (two) times daily.    Marland Kitchen lamoTRIgine (LAMICTAL) 100 MG tablet Take 100 mg by mouth daily.    Marland Kitchen lamoTRIgine (LAMICTAL) 200 MG tablet Take 200 mg by mouth daily.    Marland Kitchen LORazepam (ATIVAN) 1 MG tablet Take 1 tablet (1 mg total) by mouth 2 (two) times daily as needed for seizure (or shaking). 10 tablet 0  . OLANZapine (ZYPREXA) 10 MG tablet Take 10 mg by mouth at bedtime.    . risperiDONE (RISPERDAL) 3 MG tablet Take 3 mg by mouth at bedtime.      Musculoskeletal: Strength & Muscle Tone: within normal limits Gait & Station: normal Patient leans: N/A  Psychiatric Specialty Exam: Physical Exam  Nursing note and vitals reviewed. Constitutional: He appears well-developed and well-nourished.  HENT:  Head: Normocephalic and atraumatic.  Eyes: Pupils are equal, round, and reactive to light. Conjunctivae are normal.  Neck: Normal range of motion.  Cardiovascular: Regular rhythm and normal heart sounds.   Respiratory: Effort normal. No respiratory distress.  GI: Soft.  Musculoskeletal: Normal range of motion.  Neurological: No cranial nerve deficit.  Skin: Skin is warm and dry.  Psychiatric: He has a normal mood and affect. Judgment normal. His affect is not blunt. His speech is delayed. He is slowed. Cognition and memory are normal. He expresses no homicidal and no suicidal ideation. He exhibits normal recent memory.    Review of Systems  Constitutional: Negative.   HENT: Negative.   Eyes: Negative.   Respiratory: Negative.   Cardiovascular: Negative.   Gastrointestinal: Negative.   Musculoskeletal: Negative.   Skin: Negative.   Neurological: Negative.   Psychiatric/Behavioral: Negative for depression, hallucinations,  memory loss, substance abuse and suicidal ideas. The patient is not nervous/anxious and does not have insomnia.     Blood pressure 106/65, pulse 66, temperature 98.2 F (36.8 C), temperature source Oral, resp. rate 16, height 6' (1.829 m), weight 230 lb (104.3 kg), SpO2 97 %.Body mass index is 31.19 kg/m.  General Appearance: Fairly Groomed  Eye Contact:  Good  Speech:  Slow  Volume:  Normal  Mood:  Euthymic  Affect:  Appropriate  Thought Process:  Goal Directed  Orientation:  Full (Time, Place, and Person)  Thought Content:  Concrete. Minimal elaboration.  Suicidal Thoughts:  No  Homicidal Thoughts:  No  Memory:  Immediate;   Fair Recent;   Poor Remote;   Fair  Judgement:  Fair  Insight:  Shallow  Psychomotor Activity:  Normal  Concentration:  Concentration: Fair  Recall:  Fair  Fund of Knowledge:  Fair  Language:  Fair  Akathisia:  No  Handed:  Right  AIMS (if indicated):     Assets:  Housing Social Support  ADL's:  Impaired  Cognition:  Impaired,  Mild  Sleep:        Treatment Plan Summary: Daily contact with patient to assess and evaluate symptoms and progress in treatment, Medication management and Plan On reevaluation today the patient is not endorsing any significant symptoms. He has a history of schizophrenia but has been compliant with medicine. There is no sign currently of acute dangerousness. He has been compliant with appropriate medicine. Unclear still whether any of the events yesterday were in actual seizure or might of been a pseudoseizure related to stress and anxiety. In any case he no longer requires inpatient level treatment. He has appropriate outpatient treatment in place for medical and psychiatric concerns. Case reviewed with emergency room doctor and TTS. Discontinue IVC. Patient can be discharged back to his group home with usual outpatient follow-up  Disposition: Patient does not meet criteria for psychiatric inpatient admission. Supportive therapy  provided about ongoing stressors.  Alethia Berthold, MD 06/26/2017 2:13 PM

## 2017-06-26 NOTE — ED Notes (Signed)
Pt was discharged per order to lobby, where group home representative met pt for transport. AVS was reviewed with pt and belongings were returned to him. Pt verbalized receiving all belongings and signed for discharge. He was calm and pleasant and verbalized readiness for discharge.

## 2017-08-11 ENCOUNTER — Inpatient Hospital Stay
Admission: EM | Admit: 2017-08-11 | Discharge: 2017-08-12 | DRG: 101 | Disposition: A | Discharge status: Home / Self Care | Payer: Medicaid Other | Attending: Internal Medicine | Admitting: Internal Medicine

## 2017-08-11 ENCOUNTER — Emergency Department: Payer: Medicaid Other

## 2017-08-11 DIAGNOSIS — F17210 Nicotine dependence, cigarettes, uncomplicated: Secondary | ICD-10-CM | POA: Diagnosis present

## 2017-08-11 DIAGNOSIS — J069 Acute upper respiratory infection, unspecified: Secondary | ICD-10-CM

## 2017-08-11 DIAGNOSIS — G40909 Epilepsy, unspecified, not intractable, without status epilepticus: Principal | ICD-10-CM | POA: Diagnosis present

## 2017-08-11 DIAGNOSIS — F209 Schizophrenia, unspecified: Secondary | ICD-10-CM | POA: Diagnosis present

## 2017-08-11 DIAGNOSIS — B9789 Other viral agents as the cause of diseases classified elsewhere: Secondary | ICD-10-CM

## 2017-08-11 DIAGNOSIS — R509 Fever, unspecified: Secondary | ICD-10-CM

## 2017-08-11 DIAGNOSIS — R569 Unspecified convulsions: Secondary | ICD-10-CM

## 2017-08-11 DIAGNOSIS — G4089 Other seizures: Secondary | ICD-10-CM | POA: Diagnosis present

## 2017-08-11 LAB — URINALYSIS, COMPLETE (UACMP) WITH MICROSCOPIC
BILIRUBIN URINE: NEGATIVE
Bacteria, UA: NONE SEEN
Glucose, UA: NEGATIVE mg/dL
Hgb urine dipstick: NEGATIVE
KETONES UR: 5 mg/dL — AB
Leukocytes, UA: NEGATIVE
Nitrite: NEGATIVE
PH: 7 (ref 5.0–8.0)
PROTEIN: 100 mg/dL — AB
Specific Gravity, Urine: 1.027 (ref 1.005–1.030)

## 2017-08-11 LAB — URINE DRUG SCREEN, QUALITATIVE (ARMC ONLY)
Amphetamines, Ur Screen: NOT DETECTED
Barbiturates, Ur Screen: NOT DETECTED
Benzodiazepine, Ur Scrn: NOT DETECTED
CANNABINOID 50 NG, UR ~~LOC~~: NOT DETECTED
Cocaine Metabolite,Ur ~~LOC~~: NOT DETECTED
MDMA (ECSTASY) UR SCREEN: NOT DETECTED
Methadone Scn, Ur: NOT DETECTED
OPIATE, UR SCREEN: NOT DETECTED
PHENCYCLIDINE (PCP) UR S: NOT DETECTED
Tricyclic, Ur Screen: NOT DETECTED

## 2017-08-11 LAB — CBC
HEMATOCRIT: 37.6 % — AB (ref 40.0–52.0)
Hemoglobin: 13.4 g/dL (ref 13.0–18.0)
MCH: 33.1 pg (ref 26.0–34.0)
MCHC: 35.6 g/dL (ref 32.0–36.0)
MCV: 92.8 fL (ref 80.0–100.0)
PLATELETS: 148 10*3/uL — AB (ref 150–440)
RBC: 4.05 MIL/uL — AB (ref 4.40–5.90)
RDW: 13.2 % (ref 11.5–14.5)
WBC: 6.4 10*3/uL (ref 3.8–10.6)

## 2017-08-11 LAB — BASIC METABOLIC PANEL
Anion gap: 12 (ref 5–15)
BUN: 11 mg/dL (ref 6–20)
CHLORIDE: 100 mmol/L — AB (ref 101–111)
CO2: 20 mmol/L — AB (ref 22–32)
CREATININE: 1.4 mg/dL — AB (ref 0.61–1.24)
Calcium: 9.4 mg/dL (ref 8.9–10.3)
GFR calc Af Amer: >60 mL/min (ref >60)
GFR calc non Af Amer: >60 mL/min (ref >60)
Glucose, Bld: 85 mg/dL (ref 65–99)
Potassium: 4.5 mmol/L (ref 3.5–5.1)
Sodium: 132 mmol/L — ABNORMAL LOW (ref 135–145)

## 2017-08-11 LAB — VALPROIC ACID LEVEL: VALPROIC ACID LVL: 147 ug/mL — AB (ref 50.0–100.0)

## 2017-08-11 MED ORDER — SODIUM CHLORIDE 0.9 % IV BOLUS (SEPSIS)
1000.0000 mL | Freq: Once | INTRAVENOUS | Status: AC
  Administered 2017-08-11: 1000 mL via INTRAVENOUS

## 2017-08-11 MED ORDER — POLYETHYLENE GLYCOL 3350 17 G PO PACK: 17.0000 g | PACK | Freq: Every day | ORAL | Status: DC | PRN

## 2017-08-11 MED ORDER — IBUPROFEN 600 MG PO TABS
ORAL_TABLET | ORAL | Status: AC
  Filled 2017-08-11: qty 1

## 2017-08-11 MED ORDER — HEPARIN SODIUM (PORCINE) 5000 UNIT/ML IJ SOLN
5000.0000 [IU] | Freq: Three times a day (TID) | INTRAMUSCULAR | Status: DC
  Administered 2017-08-11 – 2017-08-12 (×2): 5000 [IU] via SUBCUTANEOUS
  Filled 2017-08-11 (×2): qty 1

## 2017-08-11 MED ORDER — ACETAMINOPHEN 650 MG RE SUPP: 650.0000 mg | Freq: Four times a day (QID) | RECTAL | Status: DC | PRN

## 2017-08-11 MED ORDER — BISACODYL 10 MG RE SUPP: 10.0000 mg | Freq: Every day | RECTAL | Status: DC | PRN

## 2017-08-11 MED ORDER — ACETAMINOPHEN 325 MG PO TABS: 650.0000 mg | ORAL_TABLET | Freq: Four times a day (QID) | ORAL | Status: DC | PRN

## 2017-08-11 MED ORDER — ONDANSETRON HCL 4 MG PO TABS: 4.0000 mg | ORAL_TABLET | Freq: Four times a day (QID) | ORAL | Status: DC | PRN

## 2017-08-11 MED ORDER — VALPROATE SODIUM 500 MG/5ML IV SOLN
500.0000 mg | Freq: Once | INTRAVENOUS | Status: AC
  Administered 2017-08-11: 500 mg via INTRAVENOUS
  Filled 2017-08-11: qty 5

## 2017-08-11 MED ORDER — IPRATROPIUM-ALBUTEROL 0.5-2.5 (3) MG/3ML IN SOLN: 3.0000 mL | Freq: Four times a day (QID) | RESPIRATORY_TRACT | Status: DC

## 2017-08-11 MED ORDER — ONDANSETRON HCL 4 MG/2ML IJ SOLN: 4.0000 mg | Freq: Four times a day (QID) | INTRAMUSCULAR | Status: DC | PRN

## 2017-08-11 MED ORDER — RISPERIDONE 3 MG PO TABS
3.0000 mg | ORAL_TABLET | Freq: Every day | ORAL | Status: DC
  Administered 2017-08-11: 23:00:00 3 mg via ORAL
  Filled 2017-08-11 (×2): qty 1

## 2017-08-11 MED ORDER — IBUPROFEN 600 MG PO TABS
600.0000 mg | ORAL_TABLET | Freq: Once | ORAL | Status: AC
Start: 2017-08-11 — End: 2017-08-11
  Administered 2017-08-11: 600 mg via ORAL

## 2017-08-11 MED ORDER — LORAZEPAM 2 MG/ML IJ SOLN: 2.0000 mg | INTRAMUSCULAR | Status: DC | PRN

## 2017-08-11 MED ORDER — PANTOPRAZOLE SODIUM 40 MG IV SOLR
40.0000 mg | Freq: Two times a day (BID) | INTRAVENOUS | Status: DC
  Administered 2017-08-11 – 2017-08-12 (×2): 40 mg via INTRAVENOUS
  Filled 2017-08-11 (×2): qty 40

## 2017-08-11 MED ORDER — SODIUM CHLORIDE 0.9 % IV SOLN
INTRAVENOUS | Status: DC
  Administered 2017-08-11 – 2017-08-12 (×2): via INTRAVENOUS

## 2017-08-11 MED ORDER — ACETAMINOPHEN 500 MG PO TABS
1000.0000 mg | ORAL_TABLET | Freq: Once | ORAL | Status: AC
  Administered 2017-08-11: 1000 mg via ORAL
  Filled 2017-08-11: qty 2

## 2017-08-11 MED ORDER — LAMOTRIGINE 100 MG PO TABS
200.0000 mg | ORAL_TABLET | Freq: Two times a day (BID) | ORAL | Status: DC
Start: 2017-08-11 — End: 2017-08-12
  Administered 2017-08-11 – 2017-08-12 (×2): 200 mg via ORAL
  Filled 2017-08-11 (×2): qty 2

## 2017-08-11 MED ORDER — OLANZAPINE 10 MG PO TABS
10.0000 mg | ORAL_TABLET | Freq: Every day | ORAL | Status: DC
Start: 2017-08-11 — End: 2017-08-12
  Administered 2017-08-11: 10 mg via ORAL
  Filled 2017-08-11 (×2): qty 1

## 2017-08-11 MED ORDER — DIVALPROEX SODIUM 500 MG PO DR TAB
500.0000 mg | DELAYED_RELEASE_TABLET | Freq: Two times a day (BID) | ORAL | Status: DC
Start: 2017-08-11 — End: 2017-08-12
  Administered 2017-08-11 – 2017-08-12 (×2): 500 mg via ORAL
  Filled 2017-08-11 (×2): qty 1

## 2017-08-11 MED ORDER — PIPERACILLIN-TAZOBACTAM 3.375 G IVPB
3.3750 g | Freq: Three times a day (TID) | INTRAVENOUS | Status: DC
  Administered 2017-08-11 – 2017-08-12 (×2): 3.375 g via INTRAVENOUS
  Filled 2017-08-11 (×2): qty 50

## 2017-08-11 NOTE — ED Notes (Signed)
Assisted pt to use urinal to void 

## 2017-08-11 NOTE — ED Notes (Addendum)
Parents are at bedside. Patient placed on 2L O2 via [place] for pulse ox of 92%.

## 2017-08-11 NOTE — ED Notes (Addendum)
Patient had a toni-clonic seizure. Dr. Don Perking at bedside. Patient was suctioned. Patient had taken a drink of water and put the cup back on the table and then had seizure. Staff at bedside to obtain IV access and place patient on the monitor.

## 2017-08-11 NOTE — ED Notes (Signed)
Attempted to call group home, Thayer Ohm - left message to call back in order to figure out if they would be picking pt up or if ems would need to transport

## 2017-08-11 NOTE — ED Provider Notes (Addendum)
[facility] Emergency Department Provider Note  ____________________________________________  Time seen: Approximately 4:28 PM  I have reviewed the triage vital signs and the nursing notes.   HISTORY  Chief Complaint Seizures   HPI Daniel Craig is a 26 y.o. male with a history of schizophrenia and seizure disorder who presents for evaluation of seizure. Patient reports that he usually has one seizure a month. Had one yesterday and one today. He endorses compliance with his medications. He denies alcohol or drugs. He endorses a cough productive of yellow sputum for the last few days. No fever or chills, no shortness of breath or chest pain, no sore throat, no nausea, no vomiting, no diarrhea.According to the group home patient had one episode of emesis after the seizure which is the reason why they sent him here for evaluation. Patient is slow to answer questions but is alert and oriented and answer questions appropriately.  Past Medical History:  Diagnosis Date  . Schizophrenia (HCC)   . Seizures [facility])     Patient Active Problem List   Diagnosis Date Noted  . Delirium 06/25/2017  . Schizophrenia (HCC) 06/25/2017  . Seizures ([facility]) 06/25/2017    Past Surgical History:  Procedure Laterality Date  . MOUTH SURGERY      Prior to Admission medications   Medication Sig Start Date End Date Taking? Authorizing Provider  cephALEXin (KEFLEX) 500 MG capsule Take 1 capsule (500 mg total) by mouth 3 (three) times daily. 05/05/17   Menshew, Charlesetta Ivory, PA-C  divalproex (DEPAKOTE) 500 MG DR tablet Take 500 mg by mouth 2 (two) times daily.    [provider]  lamoTRIgine (LAMICTAL) 100 MG tablet Take 100 mg by mouth daily.    [provider]  lamoTRIgine (LAMICTAL) 200 MG tablet Take 200 mg by mouth daily.    [provider]  LORazepam (ATIVAN) 1 MG tablet Take 1 tablet (1 mg total) by mouth 2 (two) times daily as needed for seizure (or  shaking). 01/06/17   Jennye Moccasin, MD  OLANZapine (ZYPREXA) 10 MG tablet Take 10 mg by mouth at bedtime.    [provider]  risperiDONE (RISPERDAL) 3 MG tablet Take 3 mg by mouth at bedtime.    [provider]    Allergies Keppra [levetiracetam]; Trileptal [oxcarbazepine]; and Vimpat [lacosamide]  No family history on file.  Social History Social History  Substance Use Topics  . Smoking status: Current Every Day Smoker    Packs/day: 1.00    Types: Cigarettes  . Smokeless tobacco: Never Used  . Alcohol use No    Review of Systems  Constitutional: Negative for fever. Eyes: Negative for visual changes. ENT: Negative for sore throat. Neck: No neck pain  Cardiovascular: Negative for chest pain. Respiratory: Negative for shortness of breath. + cough Gastrointestinal: Negative for abdominal pain, diarrhea. + vomiting Genitourinary: Negative for dysuria. Musculoskeletal: Negative for back pain. Skin: Negative for rash. Neurological: Negative for headaches, weakness or numbness. + SZ Psych: No SI or HI  ____________________________________________   PHYSICAL EXAM:  VITAL SIGNS: ED Triage Vitals  Enc Vitals Group     BP 08/11/17 1555 (!) 99/49     Pulse Rate 08/11/17 1555 98     Resp 08/11/17 1555 (!) 23     Temp 08/11/17 1555 (!) 97.4 F (36.3 C)     Temp Source 08/11/17 1555 Oral     SpO2 08/11/17 1550 96 %     Weight 08/11/17 1552 210  lb (95.3 kg)     Height 08/11/17 1552 6' (1.829 m)     Head Circumference --      Peak Flow --      Pain Score 08/11/17 1552 0     Pain Loc --      Pain Edu? --      Excl. in GC? --     Constitutional: Alert and oriented. Well appearing and in no apparent distress. HEENT:      Head: Normocephalic and atraumatic.         Eyes: Conjunctivae are normal. Sclera is non-icteric.       Mouth/Throat: Mucous membranes are moist.       Neck: Supple with no signs of meningismus. Cardiovascular: Regular rate and  rhythm. No murmurs, gallops, or rubs. 2+ symmetrical distal pulses are present in all extremities. No JVD. Respiratory: Normal respiratory effort. Lungs are clear to auscultation bilaterally with coarse rhonchi on the L base.  Gastrointestinal: Soft, non tender, and non distended with positive bowel sounds. No rebound or guarding. Musculoskeletal: Nontender with normal range of motion in all extremities. No edema, cyanosis, or erythema of extremities. Neurologic: Normal speech and language. Face is symmetric. Moving all extremities. No gross focal neurologic deficits are appreciated. Skin: Skin is warm, dry and intact. No rash noted. Psychiatric: Mood and affect are normal. Speech and behavior are normal.  ____________________________________________   LABS (all labs ordered are listed, but only abnormal results are displayed)  Labs Reviewed  BASIC METABOLIC PANEL - Abnormal; Notable for the following:       Result Value   Sodium 132 (*)    Chloride 100 (*)    CO2 20 (*)    Creatinine, Ser 1.40 (*)    All other components within normal limits  CBC - Abnormal; Notable for the following:    RBC 4.05 (*)    HCT 37.6 (*)    Platelets 148 (*)    All other components within normal limits  VALPROIC ACID LEVEL - Abnormal; Notable for the following:    Valproic Acid Lvl 147 (*)    All other components within normal limits  URINE DRUG SCREEN, QUALITATIVE ([facility] ONLY)  LAMOTRIGINE LEVEL   ____________________________________________  EKG  none  ____________________________________________  RADIOLOGY  none  ____________________________________________   PROCEDURES  Procedure(s) performed: None Procedures Critical Care performed:  None ____________________________________________   INITIAL IMPRESSION / ASSESSMENT AND PLAN / ED COURSE  26 y.o. male with a history of schizophrenia and seizure disorder who presents for evaluation of seizure. Patient seems to be back to his  baseline, is alert and oriented 3, neurologically intact although slow to answer questions. Patient complaining of a cough for the last few days and has coarse rhonchi on the left base therefore will send for a chest x-ray to rule out pneumonia which could have lowered his seizure threshold. We'll check Lamictal and Depakote levels and basic electrolytes to rule out any electrolyte abnormalities as the cause of patient's seizure. We'll monitor in the emergency department under seizure precautions.    _________________________ 6:28 PM on 08/11/2017 -----------------------------------------  Patient's labs concerning for mild dehydration. Patient received IV fluids. His Depakote level was slightly elevated however in the setting of dehydration and slightly elevated creatinine I was a little concerned about making any changes in his medications especially since patient has had 2 seizures already this week. I recommend that he follows up with his neurologist within 2-3 days for further evaluation. I have attempted  to contact his neurologist office however there is no answering service at this time. Discussed with patient's group home the need to follow-up with his neurologist. Patient remained well-appearing in the emergency room and will be discharged home at this time.  _________________________ 6:35 PM on 08/11/2017 -----------------------------------------  Patient now with temp 101.37F and cough concerning for viral URI. CXR negative for PNA. Normal WBC. Will give tylenol. Spoke with Thayer Ohm, patient's caretaker at the group home and recommended fever control, increased PO hydration, close f/u with patient's neurologist  _________________________ 8:14 PM on 08/11/2017 -----------------------------------------  Patient was waiting for his family to be discharged when he had another seizure in the emergency room. Patient has had a total of 3 seizures since this morning therefore he was going to be  admitted at this time. We'll give him his dose of IV Depakote. Patient is postictal and Lamictal has to be hold at this time.  Pertinent labs & imaging results that were available during my care of the patient were reviewed by me and considered in my medical decision making (see chart for details).    ____________________________________________   FINAL CLINICAL IMPRESSION(S) / ED DIAGNOSES  Final diagnoses:  Seizure (HCC)  Fever, unspecified fever cause  Viral URI with cough      NEW MEDICATIONS STARTED DURING THIS VISIT:  New Prescriptions   No medications on file     Note:  This document was prepared using Dragon voice recognition software and may include unintentional dictation errors.    Nita Sickle, MD 08/11/17 1830    Nita Sickle, MD 08/11/17 Susie Cassette, [place], MD 08/11/17 1840    Nita Sickle, MD 08/11/17 2014

## 2017-08-11 NOTE — ED Triage Notes (Signed)
Pt arrived via ems from group home for report of seizure - the group home reported that pt vomited after the seizure and that is why they sent him to the er for eval - pt appears a little confused/post-dictal but is A&O x3

## 2017-08-11 NOTE — ED Notes (Signed)
Pt noted to be shivering - temp checked by Dr Don Perking - it was 101.8 - see new orders

## 2017-08-11 NOTE — ED Notes (Signed)
Group home will be coming to get pt - awaiting his ride to discharge

## 2017-08-11 NOTE — ED Notes (Signed)
Seizure pads in place on bed and floor.

## 2017-08-11 NOTE — ED Notes (Signed)
[facility] [address] [place]  - care giver Thayer Ohm called to check on patient and request that reports be called to her at [phone] when pt is ready for discharge

## 2017-08-11 NOTE — ED Notes (Signed)
Report called to group home by Dr Don Perking

## 2017-08-11 NOTE — ED Notes (Signed)
Report given to Sonjia RN  

## 2017-08-11 NOTE — ED Notes (Signed)
Gave pt urinal for UA and pt stated he needs to go home. Pt is trying to urinate for me now.

## 2017-08-11 NOTE — H&P (Signed)
History and Physical    [name] ZOX:[id number] DOB: 1991/01/29 DOA: 08/11/2017  Referring physician: Dr. Don Perking PCP: Patient, No Pcp Per  Specialists: none  Chief Complaint: fever with seizures  HPI: Daniel Craig is a 26 y.o. male has a past medical history significant for seizures and schizophrenia who presents to ER with recurrent fevers with cough and fever. Continues to have seizures in ER. Fever w/u non-diagnostic. He is now admitted. Currently post-ictal and lethargic and unable to answer questions  Review of Systems: unable to obtain due to post-ictal state  Past Medical History:  Diagnosis Date  . Schizophrenia (HCC)   . Seizures (HCC)    Past Surgical History:  Procedure Laterality Date  . MOUTH SURGERY     Social History:  reports that he has been smoking Cigarettes.  He has been smoking about 1.00 pack per day. He has never used smokeless tobacco. He reports that he does not drink alcohol or use drugs.  Allergies  Allergen Reactions  . Keppra [Levetiracetam]   . Trileptal [Oxcarbazepine]   . Vimpat [Lacosamide]     History reviewed. No pertinent family history.  Prior to Admission medications   Medication Sig Start Date End Date Taking? Authorizing Provider  cephALEXin (KEFLEX) 500 MG capsule Take 1 capsule (500 mg total) by mouth 3 (three) times daily. 05/05/17   Menshew, Charlesetta Ivory, PA-C  divalproex (DEPAKOTE) 500 MG DR tablet Take 500 mg by mouth 2 (two) times daily.    [provider]  lamoTRIgine (LAMICTAL) 100 MG tablet Take 100 mg by mouth daily.    [provider]  lamoTRIgine (LAMICTAL) 200 MG tablet Take 200 mg by mouth daily.    [provider]  LORazepam (ATIVAN) 1 MG tablet Take 1 tablet (1 mg total) by mouth 2 (two) times daily as needed for seizure (or shaking). 01/06/17   Jennye Moccasin, MD  OLANZapine (ZYPREXA) 10 MG tablet Take 10 mg by mouth at bedtime.    [provider]  risperiDONE (RISPERDAL)  3 MG tablet Take 3 mg by mouth at bedtime.    [provider]   Physical Exam: Vitals:   08/11/17 1800 08/11/17 1834 08/11/17 1914 08/11/17 2005  BP: 125/74  110/75 (!) 95/56  Pulse: 96  (!) 104 (!) 104  Resp:   18 16  Temp:  (!) 101.8 F (38.8 C)  (!) 102.7 F (39.3 C)  TempSrc:  Oral  Oral  SpO2: 99%  99% 100%  Weight:      Height:         General:  No apparent distress, [place]/AT, WDWN  Eyes: PERRL, EOMI, no scleral icterus, conjunctiva clear  ENT: moist oropharynx without exudate, TM's benign, dentition fair  Neck: supple, no lymphadenopathy. No bruits or thyromegaly  Cardiovascular: regular rate without MRG; 2+ peripheral pulses, no JVD, no peripheral edema  Respiratory: scattered rhonchi without wheezes or rales. Respiratory effort normal  Abdomen: soft, non tender to palpation, positive bowel sounds, no guarding, no rebound  Skin: no rashes or lesions  Musculoskeletal: normal bulk and tone, no joint swelling  Psychiatric: lethargic, non-verbal  Neurologic: CN 2-12 grossly intact, Motor strength 5/5 in all 4 groups with symmetric DTR's and non-focal sensory exam  Labs on Admission:  Basic Metabolic Panel:  Recent Labs Lab 08/11/17 1602  NA 132*  K 4.5  CL 100*  CO2 20*  GLUCOSE 85  BUN 11  CREATININE 1.40*  CALCIUM 9.4   Liver Function Tests:  No results for input(s): AST, ALT, ALKPHOS, BILITOT, PROT, ALBUMIN in the last 168 hours. No results for input(s): LIPASE, AMYLASE in the last 168 hours. No results for input(s): AMMONIA in the last 168 hours. CBC:  Recent Labs Lab 08/11/17 1602  WBC 6.4  HGB 13.4  HCT 37.6*  MCV 92.8  PLT 148*   Cardiac Enzymes: No results for input(s): CKTOTAL, CKMB, CKMBINDEX, TROPONINI in the last 168 hours.  BNP (last 3 results) No results for input(s): BNP in the last 8760 hours.  ProBNP (last 3 results) No results for input(s): PROBNP in the last 8760 hours.  CBG: No results for input(s): GLUCAP in  the last 168 hours.  Radiological Exams on Admission: Dg Chest 2 View  Result Date: 08/11/2017 CLINICAL DATA:  Seizure.  Vomiting.  Cough for 2 weeks. EXAM: CHEST  2 VIEW COMPARISON:  12/18/2016 FINDINGS: The heart size and mediastinal contours are within normal limits. Both lungs are clear. The visualized skeletal structures are unremarkable. IMPRESSION: No active cardiopulmonary disease. Electronically Signed   By: Signa Kell M.D.   On: 08/11/2017 16:53    EKG: Independently reviewed.  Assessment/Plan Principal Problem:   Seizures (HCC) Active Problems:   Schizophrenia (HCC)   Fever   Acute URI   Will admit to floor with IV fluids and empiric IV ABX. IV Depakote now and IV Lorazepam as needed. Consult Neurology. Repeat labs in AM  Diet: NPO until alert Fluids: NS@100  DVT Prophylaxis: SQ Heparin  Code Status: FULL  Family Communication: none  Disposition Plan: home  Time spent: 50 min

## 2017-08-11 NOTE — ED Notes (Signed)
Pt assisted to use urinal to void and obtain urine sample

## 2017-08-11 NOTE — ED Notes (Signed)
Pt is requesting to go home and states that he feels better and that nothing is wrong with him

## 2017-08-11 NOTE — ED Notes (Addendum)
Call was made to Varney Baas from the group home to inquire about when she would be here to pick up the patient. She said that the group home would not be taking him back due to the patient's fever of 101.4 which was treated in the ED with Tylenol. Marlyne Beards states she was here to see him and "You guys weren't busy." Marlyne Beards states that the patient's parents would be here in approximately 1/2 hour to pick him up. Marlyne Beards states that they have a dialysis patient that has a cardiac condition who is going to surgery and they don't want to expose him to germs.

## 2017-08-11 NOTE — ED Notes (Signed)
Patient is alert, interacting with staff and parents.

## 2017-08-12 ENCOUNTER — Inpatient Hospital Stay: Payer: Medicaid Other

## 2017-08-12 LAB — COMPREHENSIVE METABOLIC PANEL
ALT: 10 U/L — AB (ref 17–63)
AST: 22 U/L (ref 15–41)
Albumin: 3.4 g/dL — ABNORMAL LOW (ref 3.5–5.0)
Alkaline Phosphatase: 29 U/L — ABNORMAL LOW (ref 38–126)
Anion gap: 6 (ref 5–15)
BUN: 9 mg/dL (ref 6–20)
CO2: 23 mmol/L (ref 22–32)
Calcium: 8.4 mg/dL — ABNORMAL LOW (ref 8.9–10.3)
Chloride: 104 mmol/L (ref 101–111)
Creatinine, Ser: 1.06 mg/dL (ref 0.61–1.24)
Glucose, Bld: 87 mg/dL (ref 65–99)
POTASSIUM: 3.8 mmol/L (ref 3.5–5.1)
Sodium: 133 mmol/L — ABNORMAL LOW (ref 135–145)
TOTAL PROTEIN: 6.3 g/dL — AB (ref 6.5–8.1)
Total Bilirubin: 0.7 mg/dL (ref 0.3–1.2)

## 2017-08-12 LAB — CBC
HEMATOCRIT: 35.6 % — AB (ref 40.0–52.0)
Hemoglobin: 12.7 g/dL — ABNORMAL LOW (ref 13.0–18.0)
MCH: 33.6 pg (ref 26.0–34.0)
MCHC: 35.7 g/dL (ref 32.0–36.0)
MCV: 94 fL (ref 80.0–100.0)
Platelets: 130 10*3/uL — ABNORMAL LOW (ref 150–440)
RBC: 3.79 MIL/uL — AB (ref 4.40–5.90)
RDW: 13.3 % (ref 11.5–14.5)
WBC: 8 10*3/uL (ref 3.8–10.6)

## 2017-08-12 MED ORDER — IPRATROPIUM-ALBUTEROL 0.5-2.5 (3) MG/3ML IN SOLN: 3.0000 mL | RESPIRATORY_TRACT | Status: DC | PRN

## 2017-08-12 MED ORDER — DIVALPROEX SODIUM 250 MG PO DR TAB: 250.0000 mg | DELAYED_RELEASE_TABLET | Freq: Every day | ORAL | Status: DC

## 2017-08-12 MED ORDER — DIVALPROEX SODIUM 250 MG PO DR TAB: 250.0000 mg | DELAYED_RELEASE_TABLET | Freq: Two times a day (BID) | ORAL | 0 refills | Status: DC

## 2017-08-12 MED ORDER — DIVALPROEX SODIUM 500 MG PO DR TAB: 500.0000 mg | DELAYED_RELEASE_TABLET | Freq: Every evening | ORAL | Status: DC

## 2017-08-12 MED ORDER — DIVALPROEX SODIUM 500 MG PO DR TAB: 500.0000 mg | DELAYED_RELEASE_TABLET | Freq: Every day | ORAL | Status: DC

## 2017-08-12 MED ORDER — AZITHROMYCIN 250 MG PO TABS: ORAL_TABLET | ORAL | 0 refills | Status: AC

## 2017-08-12 NOTE — Consult Note (Signed)
Reason for Consult:seizures Referring Physician: Dr.  Elpidio Anis  CC: seizures  HPI: Daniel Craig is an 26 y.o. male past medical history significant for seizures and schizophrenia who presents to ER with recurrent fevers with cough and fever. Presented with seizures episodes.  Currently slightly post ictal but follows commands.   At home on Lamictal and VPA  Past Medical History:  Diagnosis Date  . Schizophrenia (HCC)   . Seizures (HCC)     Past Surgical History:  Procedure Laterality Date  . MOUTH SURGERY      History reviewed. No pertinent family history.  Social History:  reports that he has been smoking Cigarettes.  He has been smoking about 1.00 pack per day. He has never used smokeless tobacco. He reports that he drinks about 2.4 oz of alcohol per week . He reports that he does not use drugs.  Allergies  Allergen Reactions  . Keppra [Levetiracetam]   . Trileptal [Oxcarbazepine]   . Vimpat [Lacosamide]     Medications: I have reviewed the patient's current medications.  ROS: History obtained from the patient  General ROS: negative for - chills, fatigue, fever, night sweats, weight gain or weight loss Psychological ROS: negative for - behavioral disorder, hallucinations, memory difficulties, mood swings or suicidal ideation Ophthalmic ROS: negative for - blurry vision, double vision, eye pain or loss of vision ENT ROS: negative for - epistaxis, nasal discharge, oral lesions, sore throat, tinnitus or vertigo Allergy and Immunology ROS: negative for - hives or itchy/watery eyes Hematological and Lymphatic ROS: negative for - bleeding problems, bruising or swollen lymph nodes Endocrine ROS: negative for - galactorrhea, hair pattern changes, polydipsia/polyuria or temperature intolerance Respiratory ROS: negative for - cough, hemoptysis, shortness of breath or wheezing Cardiovascular ROS: negative for - chest pain, dyspnea on exertion, edema or irregular  heartbeat Gastrointestinal ROS: negative for - abdominal pain, diarrhea, hematemesis, nausea/vomiting or stool incontinence Genito-Urinary ROS: negative for - dysuria, hematuria, incontinence or urinary frequency/urgency Musculoskeletal ROS: negative for - joint swelling or muscular weakness Neurological ROS: as noted in HPI Dermatological ROS: negative for rash and skin lesion changes  Physical Examination: Blood pressure (!) 121/52, pulse 63, temperature 98.7 F (37.1 C), temperature source Oral, resp. rate 20, height 6' (1.829 m), weight 114.9 kg (253 lb 6.4 oz), SpO2 99 %.    Neurological Examination   Mental Status: Alert, oriented to name but slow to respond.   Cranial Nerves: II: Discs flat bilaterally; Visual fields grossly normal, pupils equal, round, reactive to light and accommodation III,IV, VI: ptosis not present, extra-ocular motions intact bilaterally V,VII: smile symmetric, facial light touch sensation normal bilaterally VIII: hearing normal bilaterally IX,X: gag reflex present XI: bilateral shoulder shrug XII: midline tongue extension Motor: Right : Upper extremity   5/5    Left:     Upper extremity   5/5  Lower extremity   5/5     Lower extremity   5/5 Tone and bulk:normal tone throughout; no atrophy noted Sensory: Pinprick and light touch intact throughout, bilaterally Deep Tendon Reflexes: 1+ and symmetric throughout Plantars: Right: downgoing   Left: downgoing Cerebellar: normal finger-to-nose, normal rapid alternating movements and normal heel-to-shin test Gait: not tested      Laboratory Studies:   Basic Metabolic Panel:  Recent Labs Lab 08/11/17 1602 08/12/17 0436  NA 132* 133*  K 4.5 3.8  CL 100* 104  CO2 20* 23  GLUCOSE 85 87  BUN 11 9  CREATININE 1.40* 1.06  CALCIUM 9.4 8.4*  Liver Function Tests:  Recent Labs Lab 08/12/17 0436  AST 22  ALT 10*  ALKPHOS 29*  BILITOT 0.7  PROT 6.3*  ALBUMIN 3.4*   No results for input(s):  LIPASE, AMYLASE in the last 168 hours. No results for input(s): AMMONIA in the last 168 hours.  CBC:  Recent Labs Lab 08/11/17 1602 08/12/17 0436  WBC 6.4 8.0  HGB 13.4 12.7*  HCT 37.6* 35.6*  MCV 92.8 94.0  PLT 148* 130*    Cardiac Enzymes: No results for input(s): CKTOTAL, CKMB, CKMBINDEX, TROPONINI in the last 168 hours.  BNP: Invalid input(s): POCBNP  CBG: No results for input(s): GLUCAP in the last 168 hours.  Microbiology: No results found for this or any previous visit.  Coagulation Studies: No results for input(s): LABPROT, INR in the last 72 hours.  Urinalysis:  Recent Labs Lab 08/11/17 1800  COLORURINE AMBER*  LABSPEC 1.027  PHURINE 7.0  GLUCOSEU NEGATIVE  HGBUR NEGATIVE  BILIRUBINUR NEGATIVE  KETONESUR 5*  PROTEINUR 100*  NITRITE NEGATIVE  LEUKOCYTESUR NEGATIVE    Lipid Panel:  No results found for: CHOL, TRIG, HDL, CHOLHDL, VLDL, LDLCALC  HgbA1C: No results found for: HGBA1C  Urine Drug Screen:     Component Value Date/Time   LABOPIA NONE DETECTED 08/11/2017 1800   COCAINSCRNUR NONE DETECTED 08/11/2017 1800   LABBENZ NONE DETECTED 08/11/2017 1800   AMPHETMU NONE DETECTED 08/11/2017 1800   THCU NONE DETECTED 08/11/2017 1800   LABBARB NONE DETECTED 08/11/2017 1800    Alcohol Level: No results for input(s): ETH in the last 168 hours.  Other results: EKG: normal EKG, normal sinus rhythm, unchanged from previous tracings.  Imaging: Dg Chest 2 View  Result Date: 08/11/2017 CLINICAL DATA:  Seizure.  Vomiting.  Cough for 2 weeks. EXAM: CHEST  2 VIEW COMPARISON:  12/18/2016 FINDINGS: The heart size and mediastinal contours are within normal limits. Both lungs are clear. The visualized skeletal structures are unremarkable. IMPRESSION: No active cardiopulmonary disease. Electronically Signed   By: Signa Kell M.D.   On: 08/11/2017 16:53     Assessment/Plan:  past medical history significant for seizures and schizophrenia who presents to  ER with recurrent fevers with cough and fever. Continues to have seizures in ER. Fever w/u non-diagnostic. He is now admitted. Currently post-ictal and lethargic and unable to answer questions  - Urine drug screen negative - would like to obtain [facility] which I have ordered - On VPA 500 BID at home with level of 147 which is supra therapeutic and itself can lower seizure thershold.   - VPA decreased to 750 daily with 250 day time and 500 night.  Please hold and VPA today  - Lamictal 200 BID to continue  - Possible decrease seizure threshold in setting of upper respiratory infection - If CtH negative for acute abnormalities can like d/c from neuro stand poin today with above anti epileptic recommendations - he should be followed up as out pt.    08/12/2017, 10:19 AM

## 2017-08-12 NOTE — Discharge Instructions (Signed)
Resume diet and activity as before ° ° °

## 2017-08-12 NOTE — Progress Notes (Signed)
Pt for discharge home. Alert/ no resp distress.  Alert/ no s/s seizure activity.  Dad here to transport pt home/ to [place]. Instructions discussed with pt. [facility] and [facility] faxed. Diet / activity and f/u discussed. Verbalized  understanding

## 2017-08-15 LAB — LAMOTRIGINE LEVEL: LAMOTRIGINE LVL: 13.8 ug/mL (ref 2.0–20.0)

## 2017-08-15 NOTE — Discharge Summary (Signed)
[facility]   PATIENT NAME: Daniel Craig    MR#:  [id number]  DATE OF BIRTH:  01/17/1991  DATE OF ADMISSION:  08/11/2017 ADMITTING PHYSICIAN: Marguarite Arbour, MD  DATE OF DISCHARGE: 08/12/2017  2:41 PM  PRIMARY CARE PHYSICIAN: Patient, No Pcp Per   ADMISSION DIAGNOSIS:  Seizure (HCC) [R56.9] Viral URI with cough [J06.9, B97.89] Fever, unspecified fever cause [R50.9]  DISCHARGE DIAGNOSIS:  Principal Problem:   Seizures (HCC) Active Problems:   Schizophrenia (HCC)   Fever   Acute URI   SECONDARY DIAGNOSIS:   Past Medical History:  Diagnosis Date  . Schizophrenia (HCC)   . Seizures (HCC)      ADMITTING HISTORY  Chief Complaint: fever with seizures  HPI: Prayan Ulin is a 26 y.o. male has a past medical history significant for seizures and schizophrenia who presents to ER with recurrent fevers with cough and fever. Continues to have seizures in ER. Fever w/u non-diagnostic. He is now admitted. Currently post-ictal and lethargic and unable to answer questions  HOSPITAL COURSE:   * Seizure likely due to supra therapeutic depakote level. Seen by Dr. Loretha Brasil. CT head normal No seizures in the hospital. Discussed with neurology Depakote reduced from  BID to  in AM and  at night  * URI with fever and cough. No PNA on CXR. Started on azithromycin  Stable for discharge back to group home with his father who is discussed the case with   CONSULTS OBTAINED:  Treatment Team:  Pauletta Browns, MD  DRUG ALLERGIES:   Allergies  Allergen Reactions  . Keppra [Levetiracetam]   . Trileptal [Oxcarbazepine]   . Vimpat [Lacosamide]     DISCHARGE MEDICATIONS:   Discharge Medication List as of 08/12/2017  2:25 PM    START taking these medications   Details  azithromycin (ZITHROMAX Z-PAK) 250 MG tablet Take 2 tablets (500 mg) on  Day 1,  followed by 1 tablet (250 mg) once daily on Days 2 through 5., Print       CONTINUE these medications which have CHANGED   Details  divalproex (DEPAKOTE) 250 MG DR tablet Take 1 tablet (250 mg total) by mouth 2 (two) times daily. 250 mg in the morning and 500 mg at bedtime, Starting Mon 08/12/2017, Normal      CONTINUE these medications which have NOT CHANGED   Details  !! lamoTRIgine (LAMICTAL) 100 MG tablet Take 100 mg by mouth daily., Historical Med    !! lamoTRIgine (LAMICTAL) 200 MG tablet Take 200 mg by mouth daily., Historical Med    LORazepam (ATIVAN) 1 MG tablet Take 1 tablet (1 mg total) by mouth 2 (two) times daily as needed for seizure (or shaking)., Starting Sun 01/06/2017, Print    OLANZapine (ZYPREXA) 10 MG tablet Take 10 mg by mouth at bedtime., Historical Med    risperiDONE (RISPERDAL) 3 MG tablet Take 3 mg by mouth at bedtime., Historical Med     !! - Potential duplicate medications found. Please discuss with provider.    STOP taking these medications     cephALEXin (KEFLEX) 500 MG capsule         Today   VITAL SIGNS:  Blood pressure (!) 121/52, pulse 63, temperature 98.7 F (37.1 C), temperature source Oral, resp. rate 20, height 6' (1.829 m), weight 114.9 kg (253 lb 6.4 oz), SpO2 99 %.  I/O:  No intake or output data in the 24 hours ending 08/15/17 1022  PHYSICAL EXAMINATION:  Physical Exam  GENERAL:  26 y.o.-year-old patient lying in the bed with no acute distress.  LUNGS: Normal breath sounds bilaterally, no wheezing, rales,rhonchi or crepitation. No use of accessory muscles of respiration.  CARDIOVASCULAR: S1, S2 normal. No murmurs, rubs, or gallops.  ABDOMEN: Soft, non-tender, non-distended. Bowel sounds present. No organomegaly or mass.  NEUROLOGIC: Moves all 4 extremities. PSYCHIATRIC: The patient is alert and awake SKIN: No obvious rash, lesion, or ulcer.   DATA REVIEW:   CBC  Recent Labs Lab 08/12/17 0436  WBC 8.0  HGB 12.7*  HCT 35.6*  PLT 130*    Chemistries   Recent Labs Lab 08/12/17 0436  NA  133*  K 3.8  CL 104  CO2 23  GLUCOSE 87  BUN 9  CREATININE 1.06  CALCIUM 8.4*  AST 22  ALT 10*  ALKPHOS 29*  BILITOT 0.7    Cardiac Enzymes No results for input(s): TROPONINI in the last 168 hours.  Microbiology Results  No results found for this or any previous visit.  RADIOLOGY:  No results found.  Follow up with PCP in 1 week.  Management plans discussed with the patient, family and they are in agreement.  CODE STATUS:  Code Status History    Date Active Date Inactive Code Status Order ID Comments User Context   08/11/2017 10:12 PM 08/12/2017  5:59 PM Full Code [id number]  Marguarite Arbour, MD Inpatient      TOTAL TIME TAKING CARE OF THIS PATIENT ON DAY OF DISCHARGE: more than 30 minutes.   Milagros Loll R M.D on 08/15/2017 at 10:22 AM  Between 7am to 6pm - Pager - [phone]  After 6pm go to [email or web address] - password [email or web address]  [facility]  Office  [phone]  CC: Primary care physician; Patient, No Pcp Per  Note: This dictation was prepared with Dragon dictation along with smaller phrase technology. Any transcriptional errors that result from this process are unintentional.

## 2017-09-02 ENCOUNTER — Ambulatory Visit
Admission: RE | Admit: 2017-09-02 | Discharge: 2017-09-02 | Disposition: A | Discharge status: Home / Self Care | Payer: MEDICAID | Attending: Neurology | Admitting: Neurology

## 2017-09-02 DIAGNOSIS — G40409 Other generalized epilepsy and epileptic syndromes, not intractable, without status epilepticus: Principal | ICD-10-CM

## 2017-12-27 ENCOUNTER — Encounter: Payer: Self-pay | Admitting: Emergency Medicine

## 2017-12-27 ENCOUNTER — Inpatient Hospital Stay
Admission: EM | Admit: 2017-12-27 | Discharge: 2017-12-29 | DRG: 101 | Disposition: A | Discharge status: Home / Self Care | Payer: Medicaid Other | Attending: Internal Medicine | Admitting: Internal Medicine

## 2017-12-27 DIAGNOSIS — R569 Unspecified convulsions: Secondary | ICD-10-CM

## 2017-12-27 DIAGNOSIS — R509 Fever, unspecified: Secondary | ICD-10-CM

## 2017-12-27 DIAGNOSIS — F209 Schizophrenia, unspecified: Secondary | ICD-10-CM | POA: Diagnosis present

## 2017-12-27 DIAGNOSIS — E860 Dehydration: Secondary | ICD-10-CM | POA: Diagnosis present

## 2017-12-27 DIAGNOSIS — Z79899 Other long term (current) drug therapy: Secondary | ICD-10-CM

## 2017-12-27 DIAGNOSIS — R4182 Altered mental status, unspecified: Secondary | ICD-10-CM | POA: Diagnosis present

## 2017-12-27 DIAGNOSIS — J101 Influenza due to other identified influenza virus with other respiratory manifestations: Secondary | ICD-10-CM | POA: Diagnosis present

## 2017-12-27 DIAGNOSIS — F17210 Nicotine dependence, cigarettes, uncomplicated: Secondary | ICD-10-CM | POA: Diagnosis present

## 2017-12-27 DIAGNOSIS — G40909 Epilepsy, unspecified, not intractable, without status epilepticus: Principal | ICD-10-CM | POA: Diagnosis present

## 2017-12-27 LAB — VALPROIC ACID LEVEL: VALPROIC ACID LVL: 125 ug/mL — AB (ref 50.0–100.0)

## 2017-12-27 LAB — BASIC METABOLIC PANEL
ANION GAP: 9 (ref 5–15)
BUN: 12 mg/dL (ref 6–20)
CHLORIDE: 103 mmol/L (ref 101–111)
CO2: 22 mmol/L (ref 22–32)
Calcium: 9.1 mg/dL (ref 8.9–10.3)
Creatinine, Ser: 1.23 mg/dL (ref 0.61–1.24)
Glucose, Bld: 96 mg/dL (ref 65–99)
POTASSIUM: 3.8 mmol/L (ref 3.5–5.1)
SODIUM: 134 mmol/L — AB (ref 135–145)

## 2017-12-27 NOTE — ED Triage Notes (Addendum)
Pt arrived via ems from friends house after witnessed seizure. Witness reported tonic clonic seizure lasting 2-3 minutes to ems. Pt's vitals WDL for ems. Upon arrival pt alert very remains tired/sleepy. Pt denies any pain. Pt reports last seizure was "a couple days ago." pt reports compliance with medications

## 2017-12-27 NOTE — ED Provider Notes (Signed)
[facility] Emergency Department Provider Note    First MD Initiated Contact with Patient 12/27/17 2157     (approximate)  I have reviewed the triage vital signs and the nursing notes.   HISTORY  Chief Complaint Seizures    HPI Daniel Craig is a 27 y.o. male with a history of hernia as well as seizure disorder on Depakote as well as Lamictal who reports that he is been compliant with medications presents to the ER via EMS from a friend's house after witnessed seizure.  Seizure lasted roughly 2-3 minutes followed by postictal period.  Did not require any Versed or Ativan.  Patient protecting his airway.  Denies any headache.  No recent fevers.  No cough or shortness of breath.  Past Medical History:  Diagnosis Date  . Schizophrenia (HCC)   . Seizures (HCC)    No family history on file. Past Surgical History:  Procedure Laterality Date  . MOUTH SURGERY     Patient Active Problem List   Diagnosis Date Noted  . Fever 08/11/2017  . Acute URI 08/11/2017  . Delirium 06/25/2017  . Schizophrenia (HCC) 06/25/2017  . Seizures (HCC) 06/25/2017      Prior to Admission medications   Medication Sig Start Date End Date Taking? Authorizing Provider  divalproex (DEPAKOTE) 250 MG DR tablet Take 1 tablet (250 mg total) by mouth 2 (two) times daily. 250 mg in the morning and 500 mg at bedtime 08/12/17   Milagros LollSudini, Srikar, MD  lamoTRIgine (LAMICTAL) 100 MG tablet Take 100 mg by mouth daily.    [provider]  lamoTRIgine (LAMICTAL) 200 MG tablet Take 200 mg by mouth daily.    [provider]  LORazepam (ATIVAN) 1 MG tablet Take 1 tablet (1 mg total) by mouth 2 (two) times daily as needed for seizure (or shaking). 01/06/17   Jennye MoccasinQuigley, Brian S, MD  OLANZapine (ZYPREXA) 10 MG tablet Take 10 mg by mouth at bedtime.    [provider]  risperiDONE (RISPERDAL) 3 MG tablet Take 3 mg by mouth at bedtime.    [provider]     Allergies Keppra [levetiracetam]; Trileptal [oxcarbazepine]; and Vimpat [lacosamide]    Social History Social History   Tobacco Use  . Smoking status: Current Every Day Smoker    Packs/day: 1.00    Types: Cigarettes  . Smokeless tobacco: Never Used  Substance Use Topics  . Alcohol use: Yes    Alcohol/week: 2.4 oz    Types: 4 Shots of liquor per week    Comment: pt states 4 shots of liqour oer week  . Drug use: No    Review of Systems Patient denies headaches, rhinorrhea, blurry vision, numbness, shortness of breath, chest pain, edema, cough, abdominal pain, nausea, vomiting, diarrhea, dysuria, fevers, rashes or hallucinations unless otherwise stated above in HPI. ____________________________________________   PHYSICAL EXAM:  VITAL SIGNS: Vitals:   12/27/17 2156  BP: 128/65  Pulse: (!) 105  Resp: (!) 21  Temp: 99.5 F (37.5 C)  SpO2: 94%    Constitutional: Drowsy and postictal appearing but in no acute distress. Eyes: Conjunctivae are normal.  Head: Atraumatic. Nose: No congestion/rhinnorhea. Mouth/Throat: Mucous membranes are moist.   Neck: No stridor. Painless ROM.  Cardiovascular: Normal rate, regular rhythm. Grossly normal heart sounds.  Good peripheral circulation. Respiratory: Normal respiratory effort.  No retractions. Lungs CTAB. Gastrointestinal: Soft and nontender. No distention. No abdominal bruits. No CVA tenderness. Genitourinary:  Musculoskeletal: No lower extremity tenderness nor edema.  No joint effusions. Neurologic: drowsy but responding appropriately,  MAE. Normal speech and language. No gross focal neurologic deficits are appreciated. No facial droop Skin:  Skin is warm, dry and intact. No rash noted. Psychiatric: Mood and affect are normal. Speech and behavior are normal.  ____________________________________________   LABS (all labs ordered are listed, but only abnormal results are displayed)  Results for orders placed or performed  during the hospital encounter of 12/27/17 (from the past 24 hour(s))  Valproic Acid (depakote) Level (if patient is taking this medication)     Status: Abnormal   Collection Time: 12/27/17  9:57 PM  Result Value Ref Range   Valproic Acid Lvl 125 (H) 50.0 - 100.0 ug/mL  Basic metabolic panel - if new onset seizures     Status: Abnormal   Collection Time: 12/27/17  9:57 PM  Result Value Ref Range   Sodium 134 (L) 135 - 145 mmol/L   Potassium 3.8 3.5 - 5.1 mmol/L   Chloride 103 101 - 111 mmol/L   CO2 22 22 - 32 mmol/L   Glucose, Bld 96 65 - 99 mg/dL   BUN 12 6 - 20 mg/dL   Creatinine, Ser 4.69 0.61 - 1.24 mg/dL   Calcium 9.1 8.9 - 62.9 mg/dL   GFR calc non Af Amer >60 >60 mL/min   GFR calc Af Amer >60 >60 mL/min   Anion gap 9 5 - 15   ____________________________________________  EKG My review and personal interpretation at Time: 21:57   Indication: seizure like activity  Rate: 100  Rhythm: sinus Axis: normal Other: no wpw or brugada, normal intervals, non specific st changes ____________________________________________  RADIOLOGY   ____________________________________________   PROCEDURES  Procedure(s) performed:  Procedures    Critical Care performed: no ____________________________________________   INITIAL IMPRESSION / ASSESSMENT AND PLAN / ED COURSE  Pertinent labs & imaging results that were available during my care of the patient were reviewed by me and considered in my medical decision making (see chart for details).  DDX: seizure, status, overdose, dysrhythmia, electrolyte abn  Daniel Craig is a 27 y.o. who presents to the ED with symptoms as described above.  Patient pending his airway.  Patient does appear postictal.  Does have history of seizure disorder with no focal deficits uncomplicated seizure.  CT imaging is indicated at this time as a trauma.  We will check blood work and observe.  EKG shows no evidence of dysrhythmia.  Depakote level was  supratherapeutic at baseline compared to previous.  Will check levels as well as UDS.  Patient will be signed out to oncoming physician Dr. Wynelle Link pending for observation.      ____________________________________________   FINAL CLINICAL IMPRESSION(S) / ED DIAGNOSES  Final diagnoses:  Seizure (HCC)      NEW MEDICATIONS STARTED DURING THIS VISIT:  New Prescriptions   No medications on file     Note:  This document was prepared using Dragon voice recognition software and may include unintentional dictation errors.    Willy Eddy, MD 12/28/17 Marlyne Beards

## 2017-12-28 ENCOUNTER — Other Ambulatory Visit: Payer: Self-pay

## 2017-12-28 ENCOUNTER — Emergency Department: Payer: Medicaid Other

## 2017-12-28 ENCOUNTER — Encounter: Payer: Self-pay | Admitting: Internal Medicine

## 2017-12-28 DIAGNOSIS — R4182 Altered mental status, unspecified: Secondary | ICD-10-CM | POA: Diagnosis present

## 2017-12-28 DIAGNOSIS — G40909 Epilepsy, unspecified, not intractable, without status epilepticus: Secondary | ICD-10-CM | POA: Diagnosis present

## 2017-12-28 DIAGNOSIS — R569 Unspecified convulsions: Secondary | ICD-10-CM | POA: Diagnosis not present

## 2017-12-28 DIAGNOSIS — F17210 Nicotine dependence, cigarettes, uncomplicated: Secondary | ICD-10-CM | POA: Diagnosis present

## 2017-12-28 DIAGNOSIS — Z79899 Other long term (current) drug therapy: Secondary | ICD-10-CM | POA: Diagnosis not present

## 2017-12-28 DIAGNOSIS — J101 Influenza due to other identified influenza virus with other respiratory manifestations: Secondary | ICD-10-CM | POA: Diagnosis present

## 2017-12-28 DIAGNOSIS — F209 Schizophrenia, unspecified: Secondary | ICD-10-CM | POA: Diagnosis present

## 2017-12-28 DIAGNOSIS — E860 Dehydration: Secondary | ICD-10-CM | POA: Diagnosis present

## 2017-12-28 LAB — CBC WITH DIFFERENTIAL/PLATELET
Basophils Absolute: 0 10*3/uL (ref 0–0.1)
Basophils Relative: 0 %
Eosinophils Absolute: 0 10*3/uL (ref 0–0.7)
Eosinophils Relative: 0 %
HCT: 35.8 % — ABNORMAL LOW (ref 40.0–52.0)
Hemoglobin: 12.4 g/dL — ABNORMAL LOW (ref 13.0–18.0)
Lymphocytes Relative: 27 %
Lymphs Abs: 1.6 10*3/uL (ref 1.0–3.6)
MCH: 32.2 pg (ref 26.0–34.0)
MCHC: 34.7 g/dL (ref 32.0–36.0)
MCV: 92.5 fL (ref 80.0–100.0)
Monocytes Absolute: 1.5 10*3/uL — ABNORMAL HIGH (ref 0.2–1.0)
Monocytes Relative: 24 %
Neutro Abs: 3 10*3/uL (ref 1.4–6.5)
Neutrophils Relative %: 49 %
Platelets: 80 10*3/uL — ABNORMAL LOW (ref 150–440)
RBC: 3.87 MIL/uL — ABNORMAL LOW (ref 4.40–5.90)
RDW: 13.3 % (ref 11.5–14.5)
WBC: 6.1 10*3/uL (ref 3.8–10.6)

## 2017-12-28 LAB — URINALYSIS, COMPLETE (UACMP) WITH MICROSCOPIC
Bacteria, UA: NONE SEEN
Bilirubin Urine: NEGATIVE
Glucose, UA: NEGATIVE mg/dL
Ketones, ur: 20 mg/dL — AB
Leukocytes, UA: NEGATIVE
Nitrite: NEGATIVE
Protein, ur: NEGATIVE mg/dL
Specific Gravity, Urine: 1.019 (ref 1.005–1.030)
Squamous Epithelial / HPF: NONE SEEN
pH: 7 (ref 5.0–8.0)

## 2017-12-28 LAB — AMMONIA: Ammonia: 41 umol/L — ABNORMAL HIGH (ref 9–35)

## 2017-12-28 LAB — URINE DRUG SCREEN, QUALITATIVE (ARMC ONLY)
Amphetamines, Ur Screen: NOT DETECTED
Barbiturates, Ur Screen: NOT DETECTED
Benzodiazepine, Ur Scrn: NOT DETECTED
Cannabinoid 50 Ng, Ur ~~LOC~~: POSITIVE — AB
Cocaine Metabolite,Ur ~~LOC~~: NOT DETECTED
MDMA (Ecstasy)Ur Screen: NOT DETECTED
Methadone Scn, Ur: NOT DETECTED
Opiate, Ur Screen: NOT DETECTED
Phencyclidine (PCP) Ur S: NOT DETECTED
Tricyclic, Ur Screen: NOT DETECTED

## 2017-12-28 LAB — ETHANOL: Alcohol, Ethyl (B): <10 mg/dL

## 2017-12-28 LAB — INFLUENZA PANEL BY PCR (TYPE A & B)
INFLAPCR: POSITIVE — AB
Influenza B By PCR: NEGATIVE

## 2017-12-28 LAB — MONONUCLEOSIS SCREEN: MONO SCREEN: NEGATIVE

## 2017-12-28 LAB — LACTIC ACID, PLASMA: Lactic Acid, Venous: 0.9 mmol/L (ref 0.5–1.9)

## 2017-12-28 LAB — GROUP A STREP BY PCR: GROUP A STREP BY PCR: NOT DETECTED

## 2017-12-28 MED ORDER — IBUPROFEN 400 MG PO TABS
400.0000 mg | ORAL_TABLET | Freq: Four times a day (QID) | ORAL | Status: DC | PRN
  Administered 2017-12-28: 400 mg via ORAL
  Filled 2017-12-28: qty 1

## 2017-12-28 MED ORDER — ONDANSETRON HCL 4 MG PO TABS: 4.0000 mg | ORAL_TABLET | Freq: Four times a day (QID) | ORAL | Status: DC | PRN

## 2017-12-28 MED ORDER — SODIUM CHLORIDE 0.9 % IV BOLUS (SEPSIS)
1000.0000 mL | Freq: Once | INTRAVENOUS | Status: AC
  Administered 2017-12-28: 1000 mL via INTRAVENOUS

## 2017-12-28 MED ORDER — RISPERIDONE 1 MG PO TABS
3.0000 mg | ORAL_TABLET | Freq: Every day | ORAL | Status: DC
  Administered 2017-12-28: 3 mg via ORAL
  Filled 2017-12-28 (×2): qty 3

## 2017-12-28 MED ORDER — OLANZAPINE 10 MG PO TABS
10.0000 mg | ORAL_TABLET | Freq: Every day | ORAL | Status: DC
  Administered 2017-12-28: 21:00:00 10 mg via ORAL
  Filled 2017-12-28 (×2): qty 1

## 2017-12-28 MED ORDER — LAMOTRIGINE 100 MG PO TABS
200.0000 mg | ORAL_TABLET | Freq: Every day | ORAL | Status: DC
  Administered 2017-12-28 – 2017-12-29 (×2): 200 mg via ORAL
  Filled 2017-12-28 (×2): qty 2

## 2017-12-28 MED ORDER — ACETAMINOPHEN 500 MG PO TABS
1000.0000 mg | ORAL_TABLET | Freq: Once | ORAL | Status: AC
  Administered 2017-12-28: 1000 mg via ORAL
  Filled 2017-12-28: qty 2

## 2017-12-28 MED ORDER — ONDANSETRON HCL 4 MG/2ML IJ SOLN: 4.0000 mg | Freq: Four times a day (QID) | INTRAMUSCULAR | Status: DC | PRN

## 2017-12-28 MED ORDER — ACETAMINOPHEN 325 MG PO TABS
650.0000 mg | ORAL_TABLET | Freq: Four times a day (QID) | ORAL | Status: DC | PRN
  Administered 2017-12-28 – 2017-12-29 (×4): 650 mg via ORAL
  Filled 2017-12-28 (×5): qty 2

## 2017-12-28 MED ORDER — ACETAMINOPHEN 650 MG RE SUPP: 650.0000 mg | Freq: Four times a day (QID) | RECTAL | Status: DC | PRN

## 2017-12-28 MED ORDER — OSELTAMIVIR PHOSPHATE 75 MG PO CAPS
75.0000 mg | ORAL_CAPSULE | Freq: Two times a day (BID) | ORAL | Status: DC
  Administered 2017-12-28 – 2017-12-29 (×2): 75 mg via ORAL
  Filled 2017-12-28 (×3): qty 1

## 2017-12-28 MED ORDER — OSELTAMIVIR PHOSPHATE 75 MG PO CAPS
75.0000 mg | ORAL_CAPSULE | Freq: Two times a day (BID) | ORAL | Status: DC
  Filled 2017-12-28: qty 1

## 2017-12-28 MED ORDER — SENNOSIDES-DOCUSATE SODIUM 8.6-50 MG PO TABS: 1.0000 | ORAL_TABLET | Freq: Every evening | ORAL | Status: DC | PRN

## 2017-12-28 MED ORDER — SODIUM CHLORIDE 0.9 % IV SOLN
INTRAVENOUS | Status: DC
  Administered 2017-12-28 – 2017-12-29 (×3): via INTRAVENOUS

## 2017-12-28 MED ORDER — OSELTAMIVIR PHOSPHATE 75 MG PO CAPS
75.0000 mg | ORAL_CAPSULE | Freq: Once | ORAL | Status: AC
  Administered 2017-12-28: 75 mg via ORAL
  Filled 2017-12-28: qty 1

## 2017-12-28 NOTE — ED Provider Notes (Signed)
-----------------------------------------   1:30 AM on 12/28/2017 -----------------------------------------  Patient sleeping; arousable questions appropriately but falls back asleep.  Urinated in the bed.  Provided second urine specimen but dropped it.  Will infuse IV fluids.  Would be helpful to obtain urine tox screen given patient's drowsiness.   ----------------------------------------- 4:20 AM on 12/28/2017 -----------------------------------------  Patient found to have fever.  Denies headache, neck pain, cough, congestion, sore throat, abdominal pain, nausea, vomiting, dysuria, diarrhea.  Neck is supple without meningismus.  No petechiae on exam.  Will further patient's workup to include blood cultures, CBC, lactate, mono, influenza.  Tylenol given for fever.   ----------------------------------------- 5:37 AM on 12/28/2017 -----------------------------------------  Patient is positive for influenza A.  Will start Tamiflu.  Still remains drowsy and to arouse.  Will discuss with hospitalist to evaluate patient in the emergency department for admission.   Irean HongSung, Jade J, MD 12/28/17 [phone]

## 2017-12-28 NOTE — Plan of Care (Signed)
  Progressing Education: Knowledge of General Education information will improve 12/28/2017 1552 - Progressing by Kathreen CosierMalcolm, Taelor Moncada A, RN Health Behavior/Discharge Planning: Ability to manage health-related needs will improve 12/28/2017 1552 - Progressing by Kathreen CosierMalcolm, Chukwuebuka Churchill A, RN Clinical Measurements: Ability to maintain clinical measurements within normal limits will improve 12/28/2017 1552 - Progressing by Kathreen CosierMalcolm, Elleen Coulibaly A, RN Will remain free from infection 12/28/2017 1552 - Progressing by Kathreen CosierMalcolm, Tanairy Payeur A, RN Diagnostic test results will improve 12/28/2017 1552 - Progressing by Kathreen CosierMalcolm, Cache Bills A, RN Respiratory complications will improve 12/28/2017 1552 - Progressing by Kathreen CosierMalcolm, Lynnwood Beckford A, RN Cardiovascular complication will be avoided 12/28/2017 1552 - Progressing by Kathreen CosierMalcolm, Harrison Zetina A, RN Education: Expressions of having a comfortable level of knowledge regarding the disease process will increase 12/28/2017 1552 - Progressing by Kathreen CosierMalcolm, Beauford Lando A, RN Safety: Verbalization of understanding the information provided will improve 12/28/2017 1552 - Progressing by Kathreen CosierMalcolm, Dyamon Sosinski A, RN

## 2017-12-28 NOTE — ED Notes (Signed)
Oncology RN called this RN for report. Report given at this time and pt will be transported after 0730.

## 2017-12-28 NOTE — H&P (Signed)
[facility]  at [facility]   PATIENT NAME: Daniel Craig    MR#:  [id number]  DATE OF BIRTH:  [birth year]  DATE OF ADMISSION:  12/27/2017  PRIMARY CARE PHYSICIAN: Patient, No Pcp Per   REQUESTING/REFERRING PHYSICIAN:   CHIEF COMPLAINT:   Chief Complaint  Patient presents with  . Seizures    HISTORY OF PRESENT ILLNESS: Daniel Craig  is a 27 y.o. male with a known history of seizure disorder, hernia presented to the emergency room after he had a witnessed seizure at a friend's house.  Patient was brought to the emergency room by EMS.  Patient is on oral Depakote and Lamictal for seizures.  Seizure lasted for 2 minutes followed by postictal period.  In the emergency room patient was very lethargic and confused.  He was worked up his flu test was positive and he was given Tamiflu.  He also had fever in the emergency room.  Not much history could be obtained when the patient has he is still lethargic and sleepy.  His Depakote level was elevated around 125.  He was worked up with CT head which showed no acute abnormality.  PAST MEDICAL HISTORY:   Past Medical History:  Diagnosis Date  . Schizophrenia (HCC)   . Seizures (HCC)     PAST SURGICAL HISTORY:  Past Surgical History:  Procedure Laterality Date  . MOUTH SURGERY      SOCIAL HISTORY:  Social History   Tobacco Use  . Smoking status: Current Every Day Smoker    Packs/day: 1.00    Types: Cigarettes  . Smokeless tobacco: Never Used  Substance Use Topics  . Alcohol use: Yes    Alcohol/week: 2.4 oz    Types: 4 Shots of liquor per week    Comment: pt states 4 shots of liqour oer week    FAMILY HISTORY:  Could not be obtained as patient is lethargic and sleepy  DRUG ALLERGIES:  Allergies  Allergen Reactions  . Keppra [Levetiracetam]   . Trileptal [Oxcarbazepine]   . Vimpat [Lacosamide]     REVIEW OF SYSTEMS:  Could not be obtained as patient is lethargic and confused  MEDICATIONS AT  HOME:  Prior to Admission medications   Medication Sig Start Date End Date Taking? Authorizing Provider  divalproex (DEPAKOTE) 250 MG DR tablet Take 1 tablet (250 mg total) by mouth 2 (two) times daily. 250 mg in the morning and 500 mg at bedtime 08/12/17  Yes Sudini, Wardell Heath, MD  lamoTRIgine (LAMICTAL) 100 MG tablet Take 100 mg by mouth daily.   Yes [provider]  lamoTRIgine (LAMICTAL) 200 MG tablet Take 200 mg by mouth daily.   Yes [provider]  LORazepam (ATIVAN) 1 MG tablet Take 1 tablet (1 mg total) by mouth 2 (two) times daily as needed for seizure (or shaking). 01/06/17  Yes Jennye Moccasin, MD  OLANZapine (ZYPREXA) 10 MG tablet Take 10 mg by mouth at bedtime.   Yes [provider]  risperiDONE (RISPERDAL) 3 MG tablet Take 3 mg by mouth at bedtime.   Yes [provider]      PHYSICAL EXAMINATION:   VITAL SIGNS: Blood pressure (!) 116/55, pulse (!) 104, temperature (!) 103.1 F (39.5 C), temperature source Oral, resp. rate 20, height 6\' 3"  (1.905 m), weight 68 kg (150 lb), SpO2 95 %.  GENERAL:  27 y.o.-year-old patient lying in the bed with no acute distress.  EYES: Pupils equal, round, reactive to light and accommodation.  No scleral icterus. Extraocular muscles intact.  HEENT: Head atraumatic, normocephalic. Oropharynx dry and nasopharynx clear.  NECK:  Supple, no jugular venous distention. No thyroid enlargement, no tenderness.  LUNGS: Normal breath sounds bilaterally, no wheezing, rales,rhonchi or crepitation. No use of accessory muscles of respiration.  CARDIOVASCULAR: S1, S2 normal. No murmurs, rubs, or gallops.  ABDOMEN: Soft, nontender, nondistended. Bowel sounds present. No organomegaly or mass.  EXTREMITIES: No pedal edema, cyanosis, or clubbing.  NEUROLOGIC: Cranial nerves II through XII are intact. Muscle strength 5/5 in all extremities. Sensation intact. Gait not checked.  PSYCHIATRIC: could not be assessed SKIN: No obvious rash,  lesion, or ulcer.   LABORATORY PANEL:   CBC Recent Labs  Lab 12/28/17 0419  WBC 6.1  HGB 12.4*  HCT 35.8*  PLT 80*  MCV 92.5  MCH 32.2  MCHC 34.7  RDW 13.3  LYMPHSABS 1.6  MONOABS 1.5*  EOSABS 0.0  BASOSABS 0.0   ------------------------------------------------------------------------------------------------------------------  Chemistries  Recent Labs  Lab 12/27/17 2157  NA 134*  K 3.8  CL 103  CO2 22  GLUCOSE 96  BUN 12  CREATININE 1.23  CALCIUM 9.1   ------------------------------------------------------------------------------------------------------------------ estimated creatinine clearance is 87.5 mL/min (by C-G formula based on SCr of 1.23 mg/dL). ------------------------------------------------------------------------------------------------------------------ No results for input(s): TSH, T4TOTAL, T3FREE, THYROIDAB in the last 72 hours.  Invalid input(s): FREET3   Coagulation profile No results for input(s): INR, PROTIME in the last 168 hours. ------------------------------------------------------------------------------------------------------------------- No results for input(s): DDIMER in the last 72 hours. -------------------------------------------------------------------------------------------------------------------  Cardiac Enzymes No results for input(s): CKMB, TROPONINI, MYOGLOBIN in the last 168 hours.  Invalid input(s): CK ------------------------------------------------------------------------------------------------------------------ Invalid input(s): POCBNP  ---------------------------------------------------------------------------------------------------------------  Urinalysis    Component Value Date/Time   COLORURINE YELLOW (A) 12/28/2017 0039   APPEARANCEUR CLEAR (A) 12/28/2017 0039   LABSPEC 1.019 12/28/2017 0039   PHURINE 7.0 12/28/2017 0039   GLUCOSEU NEGATIVE 12/28/2017 0039   HGBUR SMALL (A) 12/28/2017 0039    BILIRUBINUR NEGATIVE 12/28/2017 0039   KETONESUR 20 (A) 12/28/2017 0039   PROTEINUR NEGATIVE 12/28/2017 0039   NITRITE NEGATIVE 12/28/2017 0039   LEUKOCYTESUR NEGATIVE 12/28/2017 0039     RADIOLOGY: Ct Head Wo Contrast  Result Date: 12/28/2017 CLINICAL DATA:  Witness seizure tonight. History of seizure disorder and compliant with medications. EXAM: CT HEAD WITHOUT CONTRAST TECHNIQUE: Contiguous axial images were obtained from the base of the skull through the vertex without intravenous contrast. COMPARISON:  None. FINDINGS: Brain: No evidence of acute infarction, hemorrhage, hydrocephalus, extra-axial collection or mass lesion/mass effect. Vascular: No hyperdense vessel or unexpected calcification. Skull: Normal. Negative for fracture or focal lesion. Sinuses/Orbits: No acute finding. Other: None. IMPRESSION: No acute intracranial abnormalities. Electronically Signed   By: Burman NievesWilliam  Stevens M.D.   On: 12/28/2017 04:10   Dg Chest Port 1 View  Result Date: 12/28/2017 CLINICAL DATA:  Fever, seizure. EXAM: PORTABLE CHEST 1 VIEW COMPARISON:  Chest radiograph August 11, 2017 FINDINGS: Cardiomediastinal silhouette is unremarkable for this low inspiratory examination with crowded vasculature markings. The lungs are clear without pleural effusions or focal consolidations. Trachea projects midline and there is no pneumothorax. Included soft tissue planes and osseous structures are non-suspicious. IMPRESSION: No acute cardiopulmonary process. Electronically Signed   By: Awilda Metroourtnay  Bloomer M.D.   On: 12/28/2017 04:51    EKG: No orders found for this or any previous visit.  IMPRESSION AND PLAN: 27 year old male patient with history of schizophrenia and seizure disorder presented to the emergency room with seizure and confusion.  Admitting diagnosis 1.  Breakthrough seizure 2.  Flu 3.  Dehydration 4.  Altered mental status Treatment plan Admit patient to medical floor IV fluid hydration Start  patient on oral Tamiflu Resume Lamictal for seizures Hold Depakote for now as level is elevated Neurology consultation  All the records are reviewed and case discussed with ED provider. Management plans discussed with the patient, family and they are in agreement.  CODE STATUS:FULL CODE Code Status History    Date Active Date Inactive Code Status Order ID Comments User Context   08/11/2017 22:12 08/12/2017 17:59 Full Code [id number]  Marguarite Arbour, MD Inpatient       TOTAL TIME TAKING CARE OF THIS PATIENT: 51 minutes.    Ihor Austin M.D on 12/28/2017 at 6:13 AM  Between 7am to 6pm - Pager - [phone]  After 6pm go to [email or web address] - password [email or web address]  [facility]  Office  [phone]  CC: Primary care physician; Patient, No Pcp Per

## 2017-12-28 NOTE — ED Notes (Signed)
Notified floor that patient would be coming to the floor and report had been called, Pt alert and oriented, mask applied for transport.  Denies any needs.

## 2017-12-28 NOTE — ED Notes (Signed)
Reports attempted to be called on pt. This RN placed on hold and report will need to be given by next shift RN at this time.

## 2017-12-28 NOTE — ED Notes (Signed)
Pt in urine covered bed. Per pt, "I can't get my pants down by myself". Pt cleaned and linen changed.

## 2017-12-28 NOTE — Progress Notes (Signed)
[facility] - [employer] [facility]   PATIENT NAME: Daniel Craig    MR#:  [id number]  DATE OF BIRTH:  1991-02-25  SUBJECTIVE:  CHIEF COMPLAINT:   Chief Complaint  Patient presents with  . Seizures     Came with seizures, also have Flu and fever. Sleepy, when saw him.  REVIEW OF SYSTEMS:  CONSTITUTIONAL: positive for fever, fatigue or weakness.  EYES: No blurred or double vision.  EARS, NOSE, AND THROAT: No tinnitus or ear pain.  RESPIRATORY: No cough, shortness of breath, wheezing or hemoptysis.  CARDIOVASCULAR: No chest pain, orthopnea, edema.  GASTROINTESTINAL: No nausea, vomiting, diarrhea or abdominal pain.  GENITOURINARY: No dysuria, hematuria.  ENDOCRINE: No polyuria, nocturia,  HEMATOLOGY: No anemia, easy bruising or bleeding SKIN: No rash or lesion. MUSCULOSKELETAL: No joint pain or arthritis.   NEUROLOGIC: No tingling, numbness, weakness.  PSYCHIATRY: No anxiety or depression.   ROS  DRUG ALLERGIES:   Allergies  Allergen Reactions  . Keppra [Levetiracetam]   . Trileptal [Oxcarbazepine]   . Vimpat [Lacosamide]     VITALS:  Blood pressure 129/76, pulse 98, temperature (!) 102.5 F (39.2 C), temperature source Oral, resp. rate 16, height 6' (1.829 m), weight 111.6 kg (246 lb 1.6 oz), SpO2 99 %.  PHYSICAL EXAMINATION:  GENERAL:  27 y.o.-year-old patient lying in the bed with no acute distress.  EYES: Pupils equal, round, reactive to light and accommodation. No scleral icterus. Extraocular muscles intact.  HEENT: Head atraumatic, normocephalic. Oropharynx and nasopharynx clear.  NECK:  Supple, no jugular venous distention. No thyroid enlargement, no tenderness.  LUNGS: Normal breath sounds bilaterally, no wheezing, rales,rhonchi or crepitation. No use of accessory muscles of respiration.  CARDIOVASCULAR: S1, S2 normal. No murmurs, rubs, or gallops.  ABDOMEN: Soft, nontender, nondistended. Bowel sounds present. No organomegaly or mass.   EXTREMITIES: No pedal edema, cyanosis, or clubbing.  NEUROLOGIC: Cranial nerves II through XII are intact. Muscle strength 4-5/5 in all extremities. Sensation intact. Gait not checked.  PSYCHIATRIC: The patient is sleepy, but easily arousable, appears very tired and goes back to sleep. SKIN: No obvious rash, lesion, or ulcer.   Physical Exam LABORATORY PANEL:   CBC Recent Labs  Lab 12/28/17 0419  WBC 6.1  HGB 12.4*  HCT 35.8*  PLT 80*   ------------------------------------------------------------------------------------------------------------------  Chemistries  Recent Labs  Lab 12/27/17 2157  NA 134*  K 3.8  CL 103  CO2 22  GLUCOSE 96  BUN 12  CREATININE 1.23  CALCIUM 9.1   ------------------------------------------------------------------------------------------------------------------  Cardiac Enzymes No results for input(s): TROPONINI in the last 168 hours. ------------------------------------------------------------------------------------------------------------------  RADIOLOGY:  Ct Head Wo Contrast  Result Date: 12/28/2017 CLINICAL DATA:  Witness seizure tonight. History of seizure disorder and compliant with medications. EXAM: CT HEAD WITHOUT CONTRAST TECHNIQUE: Contiguous axial images were obtained from the base of the skull through the vertex without intravenous contrast. COMPARISON:  None. FINDINGS: Brain: No evidence of acute infarction, hemorrhage, hydrocephalus, extra-axial collection or mass lesion/mass effect. Vascular: No hyperdense vessel or unexpected calcification. Skull: Normal. Negative for fracture or focal lesion. Sinuses/Orbits: No acute finding. Other: None. IMPRESSION: No acute intracranial abnormalities. Electronically Signed   By: Burman NievesWilliam  Stevens M.D.   On: 12/28/2017 04:10   Dg Chest Port 1 View  Result Date: 12/28/2017 CLINICAL DATA:  Fever, seizure. EXAM: PORTABLE CHEST 1 VIEW COMPARISON:  Chest radiograph August 11, 2017 FINDINGS:  Cardiomediastinal silhouette is unremarkable for this low inspiratory examination with crowded vasculature markings. The lungs are clear without  pleural effusions or focal consolidations. Trachea projects midline and there is no pneumothorax. Included soft tissue planes and osseous structures are non-suspicious. IMPRESSION: No acute cardiopulmonary process. Electronically Signed   By: Awilda Metro M.D.   On: 12/28/2017 04:51    ASSESSMENT AND PLAN:   Active Problems:   Altered mental status  * Influenza   Fever control as needed   IV fluids.   bl cx sent.  * Seizures   Compliant to meds   Level was high   Neuro consult  * Lethargy   Likely due to seizures , Flu and being in ER in night.   Easily arousable and oriented, so cont to monitor for now.   All the records are reviewed and case discussed with Care Management/Social Workerr. Management plans discussed with the patient, family and they are in agreement.  CODE STATUS: full.  TOTAL TIME TAKING CARE OF THIS PATIENT: 35 minutes.     POSSIBLE D/C IN 1-2 DAYS, DEPENDING ON CLINICAL CONDITION.   Altamese Dilling M.D on 12/28/2017   Between 7am to 6pm - Pager - [phone]  After 6pm go to [email or web address] - password [email or web address]  [facility]  Office  [phone]  CC: Primary care physician; Patient, No Pcp Per  Note: This dictation was prepared with Dragon dictation along with smaller phrase technology. Any transcriptional errors that result from this process are unintentional.

## 2017-12-29 LAB — BASIC METABOLIC PANEL
ANION GAP: 10 (ref 5–15)
BUN: 8 mg/dL (ref 6–20)
CALCIUM: 8.7 mg/dL — AB (ref 8.9–10.3)
CO2: 22 mmol/L (ref 22–32)
Chloride: 105 mmol/L (ref 101–111)
Creatinine, Ser: 1.11 mg/dL (ref 0.61–1.24)
GFR calc Af Amer: >60 mL/min (ref >60)
GLUCOSE: 98 mg/dL (ref 65–99)
POTASSIUM: 3.3 mmol/L — AB (ref 3.5–5.1)
SODIUM: 137 mmol/L (ref 135–145)

## 2017-12-29 LAB — CBC
HEMATOCRIT: 39 % — AB (ref 40.0–52.0)
HEMOGLOBIN: 13.1 g/dL (ref 13.0–18.0)
MCH: 31.2 pg (ref 26.0–34.0)
MCHC: 33.7 g/dL (ref 32.0–36.0)
MCV: 92.6 fL (ref 80.0–100.0)
Platelets: 82 10*3/uL — ABNORMAL LOW (ref 150–440)
RBC: 4.21 MIL/uL — AB (ref 4.40–5.90)
RDW: 13.5 % (ref 11.5–14.5)
WBC: 6.4 10*3/uL (ref 3.8–10.6)

## 2017-12-29 MED ORDER — IBUPROFEN 400 MG PO TABS: 400.0000 mg | ORAL_TABLET | Freq: Four times a day (QID) | ORAL | 0 refills | Status: DC | PRN

## 2017-12-29 MED ORDER — ACETAMINOPHEN 325 MG PO TABS: 650.0000 mg | ORAL_TABLET | Freq: Four times a day (QID) | ORAL | 0 refills | Status: DC | PRN

## 2017-12-29 MED ORDER — OSELTAMIVIR PHOSPHATE 75 MG PO CAPS: 75.0000 mg | ORAL_CAPSULE | Freq: Two times a day (BID) | ORAL | 0 refills | Status: AC

## 2017-12-29 MED ORDER — POTASSIUM CHLORIDE CRYS ER 20 MEQ PO TBCR
40.0000 meq | EXTENDED_RELEASE_TABLET | Freq: Once | ORAL | Status: AC
  Administered 2017-12-29: 40 meq via ORAL
  Filled 2017-12-29: qty 2

## 2017-12-29 MED ORDER — PHENOL 1.4 % MT LIQD
1.0000 | OROMUCOSAL | Status: DC | PRN
  Administered 2017-12-29 (×2): 1 via OROMUCOSAL
  Filled 2017-12-29: qty 177

## 2017-12-29 NOTE — Discharge Summary (Signed)
[facility] [place] [facility]   PATIENT NAME: Daniel Craig    MR#:  [id number]  DATE OF BIRTH:  1991-08-16  DATE OF ADMISSION:  12/27/2017 ADMITTING PHYSICIAN: Ihor Austin, MD  DATE OF DISCHARGE: 12/29/2017  PRIMARY CARE PHYSICIAN: Patient, No Pcp Per    ADMISSION DIAGNOSIS:  Seizure (HCC) [R56.9] Influenza A [J10.1] Fever, unspecified fever cause [R50.9] Altered mental status, unspecified altered mental status type [R41.82]  DISCHARGE DIAGNOSIS:  Active Problems:   Altered mental status   SECONDARY DIAGNOSIS:   Past Medical History:  Diagnosis Date  . Schizophrenia (HCC)   . Seizures (HCC)     HOSPITAL COURSE:   * Influenza   Fever control as needed   IV fluids.   bl cx sent. Negative.   Finish tamiflu at home.  * Seizures   Compliant to meds   Level was high   Neuro consult appreciated, on phone neurologist told me after seeing the patient and interviewing him, that he can continue with the same dose antiseizures medication as he was taking before.  * Lethargy   Likely due to seizures , Flu and being in ER in night.   Easily arousable and oriented, so cont to monitor for now.   Completely alert and oriented the next day.  DISCHARGE CONDITIONS:   Stable.  CONSULTS OBTAINED:  Treatment Team:  Anson Fret, MD Thana Farr, MD  DRUG ALLERGIES:   Allergies  Allergen Reactions  . Keppra [Levetiracetam]   . Trileptal [Oxcarbazepine]   . Vimpat [Lacosamide]     DISCHARGE MEDICATIONS:   Allergies as of 12/29/2017      Reactions   Keppra [levetiracetam]    Trileptal [oxcarbazepine]    Vimpat [lacosamide]       Medication List    TAKE these medications   acetaminophen 325 MG tablet Commonly known as:  TYLENOL Take 2 tablets (650 mg total) by mouth every 6 (six) hours as needed for mild pain or fever (or Fever >/= 101).   divalproex 250 MG DR tablet Commonly known as:  DEPAKOTE Take 1 tablet (250 mg  total) by mouth 2 (two) times daily. 250 mg in the morning and 500 mg at bedtime   ibuprofen 400 MG tablet Commonly known as:  ADVIL,MOTRIN Take 1 tablet (400 mg total) by mouth every 6 (six) hours as needed for fever.   lamoTRIgine 100 MG tablet Commonly known as:  LAMICTAL Take 100 mg by mouth daily.   lamoTRIgine 200 MG tablet Commonly known as:  LAMICTAL Take 200 mg by mouth daily.   LORazepam 1 MG tablet Commonly known as:  ATIVAN Take 1 tablet (1 mg total) by mouth 2 (two) times daily as needed for seizure (or shaking).   OLANZapine 10 MG tablet Commonly known as:  ZYPREXA Take 10 mg by mouth at bedtime.   oseltamivir 75 MG capsule Commonly known as:  TAMIFLU Take 1 capsule (75 mg total) by mouth 2 (two) times daily for 3 days.   risperiDONE 3 MG tablet Commonly known as:  RISPERDAL Take 3 mg by mouth at bedtime.        DISCHARGE INSTRUCTIONS:    Follow with PMD in 1-2 weeks.  If you experience worsening of your admission symptoms, develop shortness of breath, life threatening emergency, suicidal or homicidal thoughts you must seek medical attention immediately by calling 911 or calling your MD immediately  if symptoms less severe.  You Must read complete instructions/literature along with all  the possible adverse reactions/side effects for all the Medicines you take and that have been prescribed to you. Take any new Medicines after you have completely understood and accept all the possible adverse reactions/side effects.   Please note  You were cared for by a hospitalist during your hospital stay. If you have any questions about your discharge medications or the care you received while you were in the hospital after you are discharged, you can call the unit and asked to speak with the hospitalist on call if the hospitalist that took care of you is not available. Once you are discharged, your primary care physician will handle any further medical issues. Please note  that NO REFILLS for any discharge medications will be authorized once you are discharged, as it is imperative that you return to your primary care physician (or establish a relationship with a primary care physician if you do not have one) for your aftercare needs so that they can reassess your need for medications and monitor your lab values.    Today   CHIEF COMPLAINT:   Chief Complaint  Patient presents with  . Seizures    HISTORY OF PRESENT ILLNESS:  Daniel Craig  is a 27 y.o. male with a known history of seizure disorder, hernia presented to the emergency room after he had a witnessed seizure at a friend's house.  Patient was brought to the emergency room by EMS.  Patient is on oral Depakote and Lamictal for seizures.  Seizure lasted for 2 minutes followed by postictal period.  In the emergency room patient was very lethargic and confused.  He was worked up his flu test was positive and he was given Tamiflu.  He also had fever in the emergency room.  Not much history could be obtained when the patient has he is still lethargic and sleepy.  His Depakote level was elevated around 125.  He was worked up with CT head which showed no acute abnormality.   VITAL SIGNS:  Blood pressure 118/62, pulse 76, temperature 99.2 F (37.3 C), temperature source Oral, resp. rate 18, height 6' (1.829 m), weight 111.6 kg (246 lb 1.6 oz), SpO2 97 %.  I/O:    Intake/Output Summary (Last 24 hours) at 12/29/2017 1230 Last data filed at 12/29/2017 1100 Gross per 24 hour  Intake 1100 ml  Output 3525 ml  Net -2425 ml    PHYSICAL EXAMINATION:   GENERAL:  27 y.o.-year-old patient lying in the bed with no acute distress.  EYES: Pupils equal, round, reactive to light and accommodation. No scleral icterus. Extraocular muscles intact.  HEENT: Head atraumatic, normocephalic. Oropharynx and nasopharynx clear.  NECK:  Supple, no jugular venous distention. No thyroid enlargement, no tenderness.  LUNGS: Normal  breath sounds bilaterally, no wheezing, rales,rhonchi or crepitation. No use of accessory muscles of respiration.  CARDIOVASCULAR: S1, S2 normal. No murmurs, rubs, or gallops.  ABDOMEN: Soft, nontender, nondistended. Bowel sounds present. No organomegaly or mass.  EXTREMITIES: No pedal edema, cyanosis, or clubbing.  NEUROLOGIC: Cranial nerves II through XII are intact. Muscle strength 4-5/5 in all extremities. Sensation intact. Gait not checked.  PSYCHIATRIC: The patient is  alert and oriented  SKIN: No obvious rash, lesion, or ulcer.     DATA REVIEW:   CBC Recent Labs  Lab 12/29/17 0614  WBC 6.4  HGB 13.1  HCT 39.0*  PLT 82*    Chemistries  Recent Labs  Lab 12/29/17 0614  NA 137  K 3.3*  CL 105  CO2  22  GLUCOSE 98  BUN 8  CREATININE 1.11  CALCIUM 8.7*    Cardiac Enzymes No results for input(s): TROPONINI in the last 168 hours.  Microbiology Results  Results for orders placed or performed during the hospital encounter of 12/27/17  Culture, blood (routine x 2)     Status: None (Preliminary result)   Collection Time: 12/28/17  4:21 AM  Result Value Ref Range Status   Specimen Description BLOOD LEFT FOREARM  Final   Special Requests   Final    BOTTLES DRAWN AEROBIC AND ANAEROBIC Blood Culture results may not be optimal due to an excessive volume of blood received in culture bottles   Culture   Final    NO GROWTH 1 DAY Performed at [facility], [address], [place], [place] [address]    Report Status PENDING  Incomplete  Culture, blood (routine x 2)     Status: None (Preliminary result)   Collection Time: 12/28/17  4:21 AM  Result Value Ref Range Status   Specimen Description BLOOD LEFT ANTECUBITAL  Final   Special Requests   Final    BOTTLES DRAWN AEROBIC AND ANAEROBIC Blood Culture results may not be optimal due to an excessive volume of blood received in culture bottles   Culture   Final    NO GROWTH 1 DAY Performed at [facility],  [address], [place], [place] [address]    Report Status PENDING  Incomplete  Group A Strep by PCR     Status: None   Collection Time: 12/28/17  4:21 AM  Result Value Ref Range Status   Group A Strep by PCR NOT DETECTED NOT DETECTED Final    Comment: Performed at [facility], [address] [place], [place] [address]    RADIOLOGY:  Ct Head Wo Contrast  Result Date: 12/28/2017 CLINICAL DATA:  Witness seizure tonight. History of seizure disorder and compliant with medications. EXAM: CT HEAD WITHOUT CONTRAST TECHNIQUE: Contiguous axial images were obtained from the base of the skull through the vertex without intravenous contrast. COMPARISON:  None. FINDINGS: Brain: No evidence of acute infarction, hemorrhage, hydrocephalus, extra-axial collection or mass lesion/mass effect. Vascular: No hyperdense vessel or unexpected calcification. Skull: Normal. Negative for fracture or focal lesion. Sinuses/Orbits: No acute finding. Other: None. IMPRESSION: No acute intracranial abnormalities. Electronically Signed   By: Burman Nieves M.D.   On: 12/28/2017 04:10   Dg Chest Port 1 View  Result Date: 12/28/2017 CLINICAL DATA:  Fever, seizure. EXAM: PORTABLE CHEST 1 VIEW COMPARISON:  Chest radiograph August 11, 2017 FINDINGS: Cardiomediastinal silhouette is unremarkable for this low inspiratory examination with crowded vasculature markings. The lungs are clear without pleural effusions or focal consolidations. Trachea projects midline and there is no pneumothorax. Included soft tissue planes and osseous structures are non-suspicious. IMPRESSION: No acute cardiopulmonary process. Electronically Signed   By: Awilda Metro M.D.   On: 12/28/2017 04:51    EKG:  No orders found for this or any previous visit.    Management plans discussed with the patient, family and they are in agreement.  CODE STATUS:     Code Status Orders  (From admission, onward)        Start     Ordered    12/28/17 0752  Full code  Continuous     12/28/17 0751    Code Status History    Date Active Date Inactive Code Status Order ID Comments User Context   08/11/2017 22:12 08/12/2017 17:59 Full  Code [id number]  Marguarite Arbour, MD Inpatient      TOTAL TIME TAKING CARE OF THIS PATIENT: 35 minutes.    Altamese Dilling M.D on 12/29/2017 at 12:30 PM  Between 7am to 6pm - Pager - [phone]  After 6pm go to [email or web address] - password [email or web address]  [facility]  Office  [phone]  CC: Primary care physician; Patient, No Pcp Per   Note: This dictation was prepared with Dragon dictation along with smaller phrase technology. Any transcriptional errors that result from this process are unintentional.

## 2017-12-30 LAB — URINE CULTURE: Culture: <10000 — AB

## 2018-01-02 LAB — CULTURE, BLOOD (ROUTINE X 2)
CULTURE: NO GROWTH
CULTURE: NO GROWTH

## 2018-02-26 ENCOUNTER — Other Ambulatory Visit: Payer: Self-pay

## 2018-02-26 ENCOUNTER — Encounter: Payer: Self-pay | Admitting: Emergency Medicine

## 2018-02-26 ENCOUNTER — Emergency Department
Admission: EM | Admit: 2018-02-26 | Discharge: 2018-02-27 | Disposition: A | Discharge status: Other Acute Inpatient Hospital | Payer: Medicaid Other | Attending: Emergency Medicine | Admitting: Emergency Medicine

## 2018-02-26 DIAGNOSIS — F209 Schizophrenia, unspecified: Secondary | ICD-10-CM | POA: Insufficient documentation

## 2018-02-26 DIAGNOSIS — R441 Visual hallucinations: Secondary | ICD-10-CM

## 2018-02-26 DIAGNOSIS — F489 Nonpsychotic mental disorder, unspecified: Secondary | ICD-10-CM

## 2018-02-26 DIAGNOSIS — F200 Paranoid schizophrenia: Secondary | ICD-10-CM | POA: Diagnosis not present

## 2018-02-26 DIAGNOSIS — F1210 Cannabis abuse, uncomplicated: Secondary | ICD-10-CM | POA: Diagnosis not present

## 2018-02-26 DIAGNOSIS — R569 Unspecified convulsions: Secondary | ICD-10-CM | POA: Diagnosis not present

## 2018-02-26 DIAGNOSIS — F17210 Nicotine dependence, cigarettes, uncomplicated: Secondary | ICD-10-CM | POA: Insufficient documentation

## 2018-02-26 DIAGNOSIS — Z046 Encounter for general psychiatric examination, requested by authority: Secondary | ICD-10-CM | POA: Diagnosis present

## 2018-02-26 DIAGNOSIS — R440 Auditory hallucinations: Secondary | ICD-10-CM

## 2018-02-26 LAB — COMPREHENSIVE METABOLIC PANEL
ALBUMIN: 3.8 g/dL (ref 3.5–5.0)
ALT: 22 U/L (ref 17–63)
ANION GAP: 7 (ref 5–15)
AST: 32 U/L (ref 15–41)
Alkaline Phosphatase: 33 U/L — ABNORMAL LOW (ref 38–126)
BUN: 14 mg/dL (ref 6–20)
CHLORIDE: 109 mmol/L (ref 101–111)
CO2: 24 mmol/L (ref 22–32)
Calcium: 9.1 mg/dL (ref 8.9–10.3)
Creatinine, Ser: 1.12 mg/dL (ref 0.61–1.24)
GFR calc Af Amer: >60 mL/min (ref >60)
GFR calc non Af Amer: >60 mL/min (ref >60)
GLUCOSE: 109 mg/dL — AB (ref 65–99)
POTASSIUM: 4 mmol/L (ref 3.5–5.1)
SODIUM: 140 mmol/L (ref 135–145)
TOTAL PROTEIN: 6.9 g/dL (ref 6.5–8.1)
Total Bilirubin: 0.5 mg/dL (ref 0.3–1.2)

## 2018-02-26 LAB — SALICYLATE LEVEL: Salicylate Lvl: <7 mg/dL (ref 2.8–30.0)

## 2018-02-26 LAB — CBC
HEMATOCRIT: 37.8 % — AB (ref 40.0–52.0)
HEMOGLOBIN: 12.7 g/dL — AB (ref 13.0–18.0)
MCH: 31.2 pg (ref 26.0–34.0)
MCHC: 33.6 g/dL (ref 32.0–36.0)
MCV: 92.9 fL (ref 80.0–100.0)
Platelets: 167 10*3/uL (ref 150–440)
RBC: 4.08 MIL/uL — AB (ref 4.40–5.90)
RDW: 13.9 % (ref 11.5–14.5)
WBC: 6.1 10*3/uL (ref 3.8–10.6)

## 2018-02-26 LAB — ETHANOL: Alcohol, Ethyl (B): <10 mg/dL (ref <10)

## 2018-02-26 LAB — ACETAMINOPHEN LEVEL

## 2018-02-26 MED ORDER — DIPHENHYDRAMINE HCL 50 MG/ML IJ SOLN
12.5000 mg | Freq: Once | INTRAMUSCULAR | Status: AC
  Administered 2018-02-26: 12.5 mg via INTRAMUSCULAR
  Filled 2018-02-26: qty 1

## 2018-02-26 MED ORDER — LORAZEPAM 2 MG/ML IJ SOLN
2.0000 mg | Freq: Once | INTRAMUSCULAR | Status: AC
  Administered 2018-02-26: 2 mg via INTRAMUSCULAR
  Filled 2018-02-26: qty 1

## 2018-02-26 MED ORDER — HALOPERIDOL LACTATE 5 MG/ML IJ SOLN
5.0000 mg | Freq: Once | INTRAMUSCULAR | Status: AC
  Administered 2018-02-26: 5 mg via INTRAMUSCULAR
  Filled 2018-02-26: qty 1

## 2018-02-26 NOTE — ED Notes (Signed)
Pt given another sandwich tray and gingerale

## 2018-02-26 NOTE — ED Notes (Signed)
Patient unable to be moved to BHU at this time. Patient too lethargic. Advertising copywriterCharge RN and BHU RN aware

## 2018-02-26 NOTE — ED Notes (Addendum)
Pt given sandwich tray and gingerale. Complied with this RN to allow me to secure toboggan, 1 black sock, 1 white sock in belongings bag with boots

## 2018-02-26 NOTE — ED Provider Notes (Addendum)
[facility] Emergency Department Provider Note  ____________________________________________  Time seen: Approximately 7:17 PM  I have reviewed the triage vital signs and the nursing notes.   HISTORY  Chief Complaint Psychiatric Evaluation    HPI Bonnye FavaKeon Purtle is a 27 y.o. male a history of schizophrenia and seizure disorder sent from his group home for hallucinations.  The patient has a history of marijuana abuse and was smoking with some friends today, came back to his group home "seeing angels."  He reports that he always has hallucinations, and they are not better or worse with marijuana.  However, the patient has tried to leave the emergency department twice, and is paranoid, without good insight.  Patient denies any pain or recent illness, suicidal ideations or homicidal ideations.  Past Medical History:  Diagnosis Date  . Schizophrenia (HCC)   . Seizures [facility](HCC)     Patient Active Problem List   Diagnosis Date Noted  . Altered mental status 12/28/2017  . Fever 08/11/2017  . Acute URI 08/11/2017  . Delirium 06/25/2017  . Schizophrenia (HCC) 06/25/2017  . Seizures (HCC) 06/25/2017    Past Surgical History:  Procedure Laterality Date  . MOUTH SURGERY      Current Outpatient Rx  . Order #: [id number] Class: Normal  . Order #: [id number] Class: Normal  . Order #: [id number] Class: Normal  . Order #: [id number] Class: Historical Med  . Order #: [id number] Class: Historical Med  . Order #: [id number] Class: Print  . Order #: [id number] Class: Historical Med  . Order #: [id number] Class: Historical Med    Allergies Keppra [levetiracetam]; Trileptal [oxcarbazepine]; and Vimpat [lacosamide]  Family History  Family history unknown: Yes    Social History Social History   Tobacco Use  . Smoking status: Current Every Day Smoker    Packs/day: 1.00    Types: Cigarettes  . Smokeless tobacco: Never Used  Substance Use Topics  . Alcohol use: Yes     Alcohol/week: 2.4 oz    Types: 4 Shots of liquor per week    Comment: pt states 4 shots of liqour oer week  . Drug use: Yes    Types: Marijuana    Review of Systems Constitutional: No fever/chills. Eyes: No visual changes. ENT: No sore throat. No congestion or rhinorrhea. Cardiovascular: Denies chest pain. Denies palpitations. Respiratory: Denies shortness of breath.  No cough. Gastrointestinal: No abdominal pain.  No nausea, no vomiting.  No diarrhea.  No constipation. Genitourinary: Negative for dysuria. Musculoskeletal: Negative for back pain. Skin: Negative for rash. Neurological: Negative for headaches. No focal numbness, tingling or weakness.  Psychiatric:Positive hallucinations without SI, HI.  Positive marijuana abuse. Endocrine: Hematological/Lymphatic: Allergic/Immunilogical: **}   ____________________________________________   PHYSICAL EXAM:  VITAL SIGNS: ED Triage Vitals  Enc Vitals Group     BP 02/26/18 1845 124/74     Pulse Rate 02/26/18 1845 93     Resp 02/26/18 1845 16     Temp 02/26/18 1843 99.1 F (37.3 C)     Temp Source 02/26/18 1843 Oral     SpO2 02/26/18 1845 100 %     Weight 02/26/18 1846 230 lb (104.3 kg)     Height 02/26/18 1846 6' (1.829 m)     Head Circumference --      Peak Flow --      Pain Score 02/26/18 1846 0     Pain Loc --      Pain Edu? --      Excl. in GC? --     Constitutional:  Alert and oriented. Well appearing and in no acute distress. Answers questions appropriately. Ambulates without difficulty. Eyes: Conjunctivae are normal.  EOMI. No scleral icterus. Head: Atraumatic. Nose: No congestion/rhinnorhea. Mouth/Throat: Mucous membranes are mildly dry.  Neck: No stridor.  Supple.  No JVD. No meningismus. Cardiovascular: Normal rate, regular rhythm. No murmurs, rubs or gallops.  Respiratory: Normal respiratory effort.  No accessory muscle use or retractions. Lungs CTAB.  No wheezes, rales or ronchi. Musculoskeletal: Moves  all extremities well. Neurologic:  A&Ox3.  Speech is clear.  Face and smile are symmetric.  EOMI.  Moves all extremities well. Skin:  Skin is warm, dry and intact. No rash noted. Psychiatric: The patient appears to be responding to internal stimulus.  He has slow speech, with slow movement.  He has poor insight into why he is here.  He denies any SI, HI on exam but does report hallucinations of angels.  ____________________________________________   LABS (all labs ordered are listed, but only abnormal results are displayed)  Labs Reviewed  COMPREHENSIVE METABOLIC PANEL - Abnormal; Notable for the following components:      Result Value   Glucose, Bld 109 (*)    Alkaline Phosphatase 33 (*)    All other components within normal limits  ACETAMINOPHEN LEVEL - Abnormal; Notable for the following components:   Acetaminophen (Tylenol), Serum <10 (*)    All other components within normal limits  CBC - Abnormal; Notable for the following components:   RBC 4.08 (*)    Hemoglobin 12.7 (*)    HCT 37.8 (*)    All other components within normal limits  SALICYLATE LEVEL  ETHANOL  URINE DRUG SCREEN, QUALITATIVE ([facility] ONLY)   ____________________________________________  EKG  Not indicated ____________________________________________  RADIOLOGY  No results found.  ____________________________________________   PROCEDURES  Procedure(s) performed: None  Procedures  Critical Care performed: No ____________________________________________   INITIAL IMPRESSION / ASSESSMENT AND PLAN / ED COURSE  Pertinent labs & imaging results that were available during my care of the patient were reviewed by me and considered in my medical decision making (see chart for details).  27 y.o. male with a history of schizophrenia, seizures and marijuana abuse presenting for auditory and visual hallucinations after having smoked marijuana.  There is some concern that he may have had a seizure.  The  patient has reassuring vital signs without any evidence of infection.  I do not think he has meningitis.  We will get basic laboratory studies.  We will watch him for seizures but he is not having seizure activity at this time.  He likely has acute on chronic substance-induced hallucinations today.  Unfortunately, the patient is unwilling to comply with full examination, so he has been given Haldol Ativan and Benadryl for sedation.   The patient continues to rest comfortably.  He will be re-evaluated in the morning.  He is medically cleared for psychiatric disposition, and has been signed out to the oncoming physician. ____________________________________________  FINAL CLINICAL IMPRESSION(S) / ED DIAGNOSES  Final diagnoses:  Auditory hallucination  Visual hallucinations  Poor insight into neurotic condition  Marijuana abuse         NEW MEDICATIONS STARTED DURING THIS VISIT:  New Prescriptions   No medications on file      Rockne Menghini, MD 02/26/18 1936    Rockne Menghini, MD 02/26/18 2257

## 2018-02-26 NOTE — ED Notes (Addendum)
approx 1930 patient walks out of room into hallway around to room 7 (where WalnutportVanessa, RN stood as a barrier without displaying any aggression and attempted to redirect patient back to room) after attempting to pry open EMS bay doors without success and refuses all redirection from officer and this RN. Pt is still voluntary at this time. Dr Sharma CovertNorman sees this event and places pt under IVC. Patient is escorted back to his room by this RN and officer. [employer] talks with pt and keeps him in his room. Dr Sharma CovertNorman comes to assess/evaluate patient and orders a modified B52, pulled from pyxis by Rosanne SackKasey, RN and given by this RN with double verification of meds. Pt did not fight while receiving injections, but heavy security presence were at bedside if needed.

## 2018-02-26 NOTE — ED Notes (Signed)
Revonda StandardAllison RN and ODS aware of IVC

## 2018-02-26 NOTE — ED Triage Notes (Signed)
EMS called to residence for seizure.  Patient had smoked some weed today and had gone to his friends house.  States "everyone deserving was going to sprout wings and the world was going to catch on fire tonight at midnight".   Patient hearing voices and emotional.  Arrives voluntary.  Patient calm.  Avoiding eye contact.  Patient states "I found out that the end of the world is today."  Patient states God told him.

## 2018-02-26 NOTE — BH Assessment (Signed)
Assessment Note  Daniel Craig is a 27 y.o. male who presents to the ED voluntarily after hearing the voice of God telling him that the world will end tonight at midnight. Pt reports daily marijuana use and was with friends smoking when he heard from God. Per ED triage RN: "EMS called to residence for seizure.  Patient had smoked some weed today and had gone to his friend's house.  States, "everyone deserving was going to sprout wings and the world was going to catch on fire tonight at midnight".   Patient hearing voices and emotional.  Arrives voluntary. Patient calm.  Avoiding eye contact.  Patient states, "I found out that the end of the world is today."  Patient states God told him."  During assessment pt was drowsy but answered this writer's questions to the best of his ability. Pt was calm and cooperative with this Clinical research associatewriter. Pt states, "God talks to me all the time" however, this was the first time he would learn of the "world ending". He reports that this information caused him to have a panic attack, which resulted in his visit to the ED.   This Clinical research associatewriter spoke with pt's legal guardian Daniel HousemanRonnie Craig (Father [phone] and he states that seeing and hearing God is what normally happens after the pt has had a seizure. He reports that sometimes directly after a seizure the pt claims that God is directing him to kill his father so Daniel Craig avoids the pt for a day or two. He also reports that the pt lives alone and has since September of 2018. Mr. Daniel Craig doesn't think that it's a good idea for his son to live alone but states that [name] thought it to be the best situation for the pt.   Pt denies SI/HI but does endorse A/V H and delusions.   Diagnosis: Schizophrenia   Past Medical History:  Past Medical History:  Diagnosis Date  . Schizophrenia (HCC)   . Seizures (HCC)     Past Surgical History:  Procedure Laterality Date  . MOUTH SURGERY      Family History:  Family  History  Family history unknown: Yes    Social History:  reports that he has been smoking cigarettes.  He has been smoking about 1.00 pack per day. He has never used smokeless tobacco. He reports that he drinks about 2.4 oz of alcohol per week. He reports that he has current or past drug history. Drug: Marijuana.  Additional Social History:  Alcohol / Drug Use Pain Medications: See MAR Prescriptions: See MAR Over the Counter: See MAR History of alcohol / drug use?: Yes Substance #1 Name of Substance 1: Marijuana 1 - Age of First Use: 14 1 - Frequency: Daily 1 - Last Use / Amount: 02/26/2018  CIWA: CIWA-Ar BP: 124/74 Pulse Rate: 93 COWS:    Allergies:  Allergies  Allergen Reactions  . Keppra [Levetiracetam]   . Trileptal [Oxcarbazepine]   . Vimpat [Lacosamide]     Home Medications:  (Not in a hospital admission)  OB/GYN Status:  No LMP for male patient.  General Assessment Data [facility] Assessment: In system Is this a Tele or Face-to-Face Assessment?: Face-to-Face Is this an Initial Assessment or a Re-assessment for this encounter?: Initial Assessment Marital status: Single Is patient pregnant?: No Pregnancy Status: No Living Arrangements: Alone Can pt return to current living arrangement?: Yes Admission Status: Involuntary Is patient capable of signing voluntary admission?: No Referral Source: Self/Family/Friend Insurance type:  Medicaid  Medical Screening Exam [facility] Walk-in ONLY) Medical Exam completed: Yes  Crisis Care Plan Living Arrangements: Alone Legal Guardian: Father  Education Status Is patient currently in school?: No Is the patient employed, unemployed or receiving disability?: Receiving disability income  Risk to self with the past 6 months Suicidal Ideation: No Has patient been a risk to self within the past 6 months prior to admission? : No Suicidal Intent: No Has patient had any suicidal intent within the past 6  months prior to admission? : No Is patient at risk for suicide?: No Suicidal Plan?: No Has patient had any suicidal plan within the past 6 months prior to admission? : No Access to Means: No What has been your use of drugs/alcohol within the last 12 months?: Pt reports daily marijuana use Previous Attempts/Gestures: No How many times?: 0 Other Self Harm Risks: none reported Triggers for Past Attempts: None known Intentional Self Injurious Behavior: None Family Suicide History: Unable to assess Recent stressful life event(s): Other (Comment)(Seizure ) Persecutory voices/beliefs?: Yes Depression: No Substance abuse history and/or treatment for substance abuse?: Yes Suicide prevention information given to non-admitted patients: Not applicable  Risk to Others within the past 6 months Homicidal Ideation: No Does patient have any lifetime risk of violence toward others beyond the six months prior to admission? : No Thoughts of Harm to Others: No Current Homicidal Intent: No Current Homicidal Plan: No Access to Homicidal Means: No History of harm to others?: No Assessment of Violence: None Noted Violent Behavior Description: None reported Does patient have access to weapons?: No Criminal Charges Pending?: No Does patient have a court date: No Is patient on probation?: No  Psychosis Hallucinations: Auditory, Visual Delusions: Persecutory  Mental Status Report Appearance/Hygiene: In scrubs Eye Contact: Fair Motor Activity: Freedom of movement Speech: Slow, Soft Level of Consciousness: Drowsy, Sedated Mood: Fearful, Euphoric Affect: Appropriate to circumstance Anxiety Level: Moderate Thought Processes: Irrelevant Judgement: Impaired Orientation: Unable to assess Obsessive Compulsive Thoughts/Behaviors: Moderate  Cognitive Functioning Concentration: Poor Memory: Unable to Assess Is patient IDD: No Is patient DD?: No Insight: Poor Impulse Control: Poor Appetite:  Poor Have you had any weight changes? : Loss Amount of the weight change? (lbs): 40 lbs Sleep: Decreased Total Hours of Sleep: 5 Vegetative Symptoms: None  ADLScreening [facility] Assessment Services) Patient's cognitive ability adequate to safely complete daily activities?: Yes Patient able to express need for assistance with ADLs?: Yes Independently performs ADLs?: Yes (appropriate for developmental age)  Prior Inpatient Therapy Prior Inpatient Therapy: Yes Prior Therapy Dates: past Prior Therapy Facilty/Provider(s): [facility] Reason for Treatment: Schizophrenia   Prior Outpatient Therapy Prior Outpatient Therapy: Yes Prior Therapy Dates: current Does patient have an ACCT team?: Unknown Does patient have Intensive In-House Services?  : Unknown Does patient have [facility] services? : Unknown Does patient have P4CC services?: Unknown  ADL Screening (condition at time of admission) Patient's cognitive ability adequate to safely complete daily activities?: Yes Is the patient deaf or have difficulty hearing?: No Does the patient have difficulty seeing, even when wearing glasses/contacts?: No Does the patient have difficulty concentrating, remembering, or making decisions?: No Patient able to express need for assistance with ADLs?: Yes Does the patient have difficulty dressing or bathing?: No Independently performs ADLs?: Yes (appropriate for developmental age) Does the patient have difficulty walking or climbing stairs?: No Weakness of Legs: None Weakness of Arms/Hands: None  Home Assistive Devices/Equipment Home Assistive Devices/Equipment: None  Therapy Consults (therapy consults require a physician order)  PT Evaluation Needed: No OT Evalulation Needed: No SLP Evaluation Needed: No Abuse/Neglect Assessment (Assessment to be complete while patient is alone) Abuse/Neglect Assessment Can Be Completed: Yes Physical Abuse: Denies Verbal Abuse: Denies Sexual  Abuse: Denies Exploitation of patient/patient's resources: Denies Self-Neglect: Denies Values / Beliefs Cultural Requests During Hospitalization: None Spiritual Requests During Hospitalization: None Consults Spiritual Care Consult Needed: No Social Work Consult Needed: No      Additional Information 1:1 In Past 12 Months?: No CIRT Risk: No Elopement Risk: No Does patient have medical clearance?: Yes  Child/Adolescent Assessment Running Away Risk: Denies Bed-Wetting: Denies Destruction of Property: Denies Cruelty to Animals: Denies Stealing: Denies Rebellious/Defies Authority: Denies Satanic Involvement: Denies Archivist: Denies Problems at [employer]: Denies Gang Involvement: Denies  Disposition:  Disposition Initial Assessment Completed for this Encounter: Yes Disposition of Patient: (Pending SOC recommendations) Patient refused recommended treatment: No Mode of transportation if patient is discharged?: Car Patient referred to: Other (Comment)  On Site Evaluation by:   Reviewed with Physician:    Maydelin Deming D Daron Breeding 02/26/2018 10:00 PM

## 2018-02-26 NOTE — ED Notes (Signed)
SOC on with patient at this time.

## 2018-02-27 ENCOUNTER — Inpatient Hospital Stay
Admission: AD | Admit: 2018-02-27 | Discharge: 2018-02-28 | DRG: 885 | Disposition: A | Discharge status: Home / Self Care | Payer: Medicaid Other | Attending: Psychiatry | Admitting: Psychiatry

## 2018-02-27 ENCOUNTER — Encounter: Payer: Self-pay | Admitting: Psychiatry

## 2018-02-27 ENCOUNTER — Other Ambulatory Visit: Payer: Self-pay

## 2018-02-27 DIAGNOSIS — I10 Essential (primary) hypertension: Secondary | ICD-10-CM | POA: Diagnosis present

## 2018-02-27 DIAGNOSIS — F17200 Nicotine dependence, unspecified, uncomplicated: Secondary | ICD-10-CM | POA: Diagnosis present

## 2018-02-27 DIAGNOSIS — F200 Paranoid schizophrenia: Secondary | ICD-10-CM | POA: Diagnosis present

## 2018-02-27 DIAGNOSIS — Z79899 Other long term (current) drug therapy: Secondary | ICD-10-CM

## 2018-02-27 DIAGNOSIS — Z888 Allergy status to other drugs, medicaments and biological substances status: Secondary | ICD-10-CM

## 2018-02-27 DIAGNOSIS — F209 Schizophrenia, unspecified: Secondary | ICD-10-CM | POA: Diagnosis not present

## 2018-02-27 DIAGNOSIS — F1220 Cannabis dependence, uncomplicated: Secondary | ICD-10-CM | POA: Diagnosis present

## 2018-02-27 DIAGNOSIS — F17210 Nicotine dependence, cigarettes, uncomplicated: Secondary | ICD-10-CM | POA: Diagnosis present

## 2018-02-27 DIAGNOSIS — G4700 Insomnia, unspecified: Secondary | ICD-10-CM | POA: Diagnosis present

## 2018-02-27 DIAGNOSIS — F068 Other specified mental disorders due to known physiological condition: Secondary | ICD-10-CM | POA: Diagnosis present

## 2018-02-27 DIAGNOSIS — R569 Unspecified convulsions: Secondary | ICD-10-CM | POA: Diagnosis present

## 2018-02-27 LAB — URINE DRUG SCREEN, QUALITATIVE (ARMC ONLY)
Amphetamines, Ur Screen: NOT DETECTED
Barbiturates, Ur Screen: NOT DETECTED
Benzodiazepine, Ur Scrn: NOT DETECTED
CANNABINOID 50 NG, UR ~~LOC~~: POSITIVE — AB
COCAINE METABOLITE, UR ~~LOC~~: NOT DETECTED
MDMA (ECSTASY) UR SCREEN: NOT DETECTED
Methadone Scn, Ur: NOT DETECTED
Opiate, Ur Screen: NOT DETECTED
Phencyclidine (PCP) Ur S: NOT DETECTED
TRICYCLIC, UR SCREEN: NOT DETECTED

## 2018-02-27 LAB — VALPROIC ACID LEVEL: Valproic Acid Lvl: 87 ug/mL (ref 50.0–100.0)

## 2018-02-27 MED ORDER — OLANZAPINE 10 MG PO TABS: 10.0000 mg | ORAL_TABLET | Freq: Every day | ORAL | Status: DC

## 2018-02-27 MED ORDER — RISPERIDONE 1 MG PO TABS
3.0000 mg | ORAL_TABLET | Freq: Every day | ORAL | Status: DC
  Administered 2018-02-27: 3 mg via ORAL
  Filled 2018-02-27 (×2): qty 3

## 2018-02-27 MED ORDER — LAMOTRIGINE 100 MG PO TABS
100.0000 mg | ORAL_TABLET | ORAL | Status: DC
  Administered 2018-02-28: 100 mg via ORAL
  Filled 2018-02-27: qty 1

## 2018-02-27 MED ORDER — ALUM & MAG HYDROXIDE-SIMETH 200-200-20 MG/5ML PO SUSP: 30.0000 mL | ORAL | Status: DC | PRN

## 2018-02-27 MED ORDER — MAGNESIUM HYDROXIDE 400 MG/5ML PO SUSP: 30.0000 mL | Freq: Every day | ORAL | Status: DC | PRN

## 2018-02-27 MED ORDER — ACETAMINOPHEN 325 MG PO TABS: 650.0000 mg | ORAL_TABLET | Freq: Four times a day (QID) | ORAL | Status: DC | PRN

## 2018-02-27 MED ORDER — RISPERIDONE 3 MG PO TABS: 3.0000 mg | ORAL_TABLET | Freq: Every day | ORAL | Status: DC

## 2018-02-27 MED ORDER — LAMOTRIGINE 100 MG PO TABS
200.0000 mg | ORAL_TABLET | Freq: Every day | ORAL | Status: DC
  Administered 2018-02-27: 200 mg via ORAL
  Filled 2018-02-27: qty 2

## 2018-02-27 MED ORDER — OLANZAPINE 10 MG PO TABS
10.0000 mg | ORAL_TABLET | Freq: Every day | ORAL | Status: DC
  Administered 2018-02-27: 10 mg via ORAL
  Filled 2018-02-27: qty 1

## 2018-02-27 MED ORDER — TRAZODONE HCL 100 MG PO TABS
100.0000 mg | ORAL_TABLET | Freq: Every evening | ORAL | Status: DC | PRN
  Administered 2018-02-27: 100 mg via ORAL
  Filled 2018-02-27: qty 1

## 2018-02-27 MED ORDER — HYDROXYZINE HCL 50 MG PO TABS: 50.0000 mg | ORAL_TABLET | Freq: Three times a day (TID) | ORAL | Status: DC | PRN

## 2018-02-27 MED ORDER — PROPRANOLOL HCL ER 60 MG PO CP24
60.0000 mg | ORAL_CAPSULE | Freq: Every day | ORAL | Status: DC
  Administered 2018-02-28: 60 mg via ORAL
  Filled 2018-02-27: qty 1

## 2018-02-27 MED ORDER — DIVALPROEX SODIUM 500 MG PO DR TAB
1000.0000 mg | DELAYED_RELEASE_TABLET | Freq: Two times a day (BID) | ORAL | Status: DC
  Administered 2018-02-27 – 2018-02-28 (×2): 1000 mg via ORAL
  Filled 2018-02-27 (×2): qty 2

## 2018-02-27 MED ORDER — DIVALPROEX SODIUM 500 MG PO DR TAB
1000.0000 mg | DELAYED_RELEASE_TABLET | Freq: Two times a day (BID) | ORAL | Status: DC
  Administered 2018-02-27: 1000 mg via ORAL
  Filled 2018-02-27: qty 2

## 2018-02-27 MED ORDER — PROPRANOLOL HCL ER 60 MG PO CP24
60.0000 mg | ORAL_CAPSULE | Freq: Every day | ORAL | Status: DC
  Administered 2018-02-27: 60 mg via ORAL
  Filled 2018-02-27: qty 1

## 2018-02-27 MED ORDER — DIAZEPAM 5 MG PO TABS
10.0000 mg | ORAL_TABLET | Freq: Once | ORAL | Status: AC
  Administered 2018-02-27: 10 mg via ORAL
  Filled 2018-02-27 (×2): qty 2

## 2018-02-27 NOTE — ED Provider Notes (Signed)
Patient will be admitted to the inpatient psychiatric service.   Williams, Jonathan E, MD 02/27/18 1246  

## 2018-02-27 NOTE — ED Notes (Signed)
Patient is resting comfortably at this time with no signs of distress present. Equal, unlabored rise and fall of chest noted within normal rate. Will continue to monitor.   

## 2018-02-27 NOTE — ED Notes (Signed)
Dr.Clapacs at bedside  

## 2018-02-27 NOTE — Plan of Care (Signed)
Patient alert and oriented to himself, place, and time. Patient denies SI, HI and AVH, although patient seems to be responding to internal stimuli, by laughing inappropriately.  Patient reluctantly signed treatment agreement, seems to be guarded, and blocking.  Patient is focused on release date. Patient states he smokes 1 pack of cigarettes a day. RN asked if he would like a nicotine patch or nicotine gum, patient never answered the question. Patient states the reason for admission was due to possible seizure. Patient appears paranoid stating,"We don't have that much time left." Patient believes the end of the world is near and God is giving him this information. Patient states he lives in a private residence and his father Daniel Craig is his guardian. Patient was oriented to the unit, given dinner, and bathing supplies.  Patient vitals were within normal limits; skin assessment complete with healing scab on left knee, patient has tattoos on bilateral arms and chest.Patient questions were answered and support given. Nurse will continue to monitor. Safety checks Q 15 minutes to continue. Problem: Education: Goal: Knowledge of [place] [facility] Education information/materials will improve Outcome: Not Progressing Goal: Mental status will improve Outcome: Not Progressing   Problem: Activity: Goal: Interest or engagement in activities will improve Outcome: Not Progressing   Problem: Coping: Goal: Ability to verbalize frustrations and anger appropriately will improve Outcome: Not Progressing

## 2018-02-27 NOTE — ED Provider Notes (Signed)
-----------------------------------------   12:45 AM on 02/27/2018 -----------------------------------------   Blood pressure 124/74, pulse 93, temperature 99.1 F (37.3 C), temperature source Oral, resp. rate 16, height 6' (1.829 m), weight 104.3 kg (230 lb), SpO2 100 %.  The patient had no acute events since last update.  Patient has been resting comfortably after receiving medications for sedation and agitation earlier on previous shift.  Disposition is pending Psychiatry/Behavioral Medicine team recommendations.     Sharyn CreamerQuale, Rasheen Schewe, MD 02/27/18 [id number]

## 2018-02-27 NOTE — ED Notes (Signed)
Report to Amy, RN

## 2018-02-27 NOTE — [facility] Group Notes (Signed)
[facility] Group Notes:  (Nursing/MHT/Case Management/Adjunct)  Date:  02/27/2018  Time:  9:37 PM  Type of Therapy:  Group Therapy  Participation Level:  Active  Participation Quality:  Appropriate  Affect:  Appropriate  Cognitive:  Alert  Insight:  Good  Engagement in Group:  Engaged  Modes of Intervention:  Support  Summary of Progress/Problems:  Mayra NeerJackie L Evann Koelzer 02/27/2018, 9:37 PM

## 2018-02-27 NOTE — ED Notes (Signed)
Pt ambulated to bathroom. Pt given specimen cup to obtain UA.

## 2018-02-27 NOTE — ED Notes (Signed)
BEHAVIORAL HEALTH ROUNDING Patient sleeping: No. Patient alert and oriented: yes Behavior appropriate: Yes.  ; If no, describe:  Nutrition and fluids offered: yes Toileting and hygiene offered: Yes  Sitter present: q15 minute observations and security monitoring Law enforcement present: Yes    

## 2018-02-27 NOTE — ED Notes (Signed)
This tech placed 2 pt belongings bags in locker 8

## 2018-02-27 NOTE — ED Notes (Signed)
Called Guardian (father) to inform him of patient's transfer to [facility].  [phone].

## 2018-02-27 NOTE — Consult Note (Signed)
[facility] Consult   Reason for Consult: Consult for 27 year old man with a history of schizophrenia and possible seizure disorder brought in with delusions and agitation Referring Physician: Jimmye Norman Patient Identification: Daniel Craig MRN:  [id number] Principal Diagnosis: Schizophrenia [facility]) Diagnosis:   Patient Active Problem List   Diagnosis Date Noted  . Altered mental status [R41.82] 12/28/2017  . Fever [R50.9] 08/11/2017  . Acute URI [J06.9] 08/11/2017  . Delirium [R41.0] 06/25/2017  . Schizophrenia ([place]) [F20.9] 06/25/2017  . Seizures ([place]) [R56.9] 06/25/2017    Total Time spent with patient: 1 hour  Subjective:   Daniel Craig is a 27 y.o. male patient admitted with "I just was having visions".  HPI: Patient interviewed.  Chart reviewed.  27 year old man with a history of schizophrenia.  EMS was called to the house of another person yesterday where the patient was having a fit shaking and was talking about the end of the world.  Patient tells me that he had gone over to some friend's house and smoked some weed and performed some rituals which consisted of holding a Bible and chanting prayers.  He said he was having visions that he could feel in his body that made him start shaking and fall down.  Patient denies suicidal or homicidal ideation.  Denies any drug use other than regular marijuana use.  Says he has been compliant with his medicine but does not know what his medicines are.  Patient remembers the episode yesterday and thinks it was not the same as his seizures.  His seizures he says are fairly well controlled with the grand mal seizures happening very rarely and the partial seizures happening only about once every couple weeks.  Patient says he is still having visions today and talks about the end of the world coming  Medical history: Possible history of seizures.  Appears to be maintained on Depakote largely for that reason.  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  Substance abuse history: History of abuse of cannabis with cannabis seeming to often be related to worsening psychotic symptoms.  Social history: Has been living in an independent apartment for several months now.  Used to live in a group home but seems to have convinced his providers that he can be independent.  Maintains close relationship with his father.  Past Psychiatric History: History of schizophrenia.  No known history of suicide attempts or violence.  Repeated episodes of delusions and bizarre behavior.  Questionable compliance.  Risk to Self: Suicidal Ideation: No Suicidal Intent: No Is patient at risk for suicide?: No Suicidal Plan?: No Access to Means: No What has been your use of drugs/alcohol within the last 12 months?: Pt reports daily marijuana use How many times?: 0 Other Self Harm Risks: none reported Triggers for Past Attempts: None known Intentional Self Injurious Behavior: None Risk to Others: Homicidal Ideation: No Thoughts of Harm to Others: No Current Homicidal Intent: No Current Homicidal Plan: No Access to Homicidal Means: No History of harm to others?: No Assessment of Violence: None Noted Violent Behavior Description: None reported Does patient have access to weapons?: No Criminal Charges Pending?: No Does patient have a court date: No Prior Inpatient Therapy: Prior Inpatient Therapy: Yes Prior Therapy Dates: past Prior Therapy Facilty/Provider[facility]): [facility] Reason for Treatment: Schizophrenia  Prior Outpatient Therapy: Prior Outpatient Therapy: Yes Prior Therapy Dates: current Does patient have an ACCT team?: Unknown Does patient have Intensive In-House Services?  : Unknown Does patient  have [facility] services? : Unknown Does patient have P4CC services?: Unknown  Past Medical History:  Past Medical History:  Diagnosis Date  . Schizophrenia ([place])   . Seizures ([place])     Past Surgical History:   Procedure Laterality Date  . MOUTH SURGERY     Family History:  Family History  Family history unknown: Yes   Family Psychiatric  History: None known Social History:  Social History   Substance and Sexual Activity  Alcohol Use Yes  . Alcohol/week: 2.4 oz  . Types: 4 Shots of liquor per week   Comment: pt states 4 shots of liqour oer week     Social History   Substance and Sexual Activity  Drug Use Yes  . Types: Marijuana    Social History   Socioeconomic History  . Marital status: Single    Spouse name: Not on file  . Number of children: Not on file  . Years of education: Not on file  . Highest education level: Not on file  Occupational History  . Not on file  Social Needs  . Financial resource strain: Not on file  . Food insecurity:    Worry: Not on file    Inability: Not on file  . Transportation needs:    Medical: Not on file    Non-medical: Not on file  Tobacco Use  . Smoking status: Current Every Day Smoker    Packs/day: 1.00    Types: Cigarettes  . Smokeless tobacco: Never Used  Substance and Sexual Activity  . Alcohol use: Yes    Alcohol/week: 2.4 oz    Types: 4 Shots of liquor per week    Comment: pt states 4 shots of liqour oer week  . Drug use: Yes    Types: Marijuana  . Sexual activity: Not on file  Lifestyle  . Physical activity:    Days per week: Not on file    Minutes per session: Not on file  . Stress: Not on file  Relationships  . Social connections:    Talks on phone: Not on file    Gets together: Not on file    Attends religious service: Not on file    Active member of club or organization: Not on file    Attends meetings of clubs or organizations: Not on file    Relationship status: Not on file  Other Topics Concern  . Not on file  Social History Narrative  . Not on file   Additional Social History:    Allergies:   Allergies  Allergen Reactions  . Keppra [Levetiracetam]   . Trileptal [Oxcarbazepine]   . Vimpat  [Lacosamide]     Labs:  Results for orders placed or performed during the hospital encounter of 02/26/18 (from the past 48 hour(s))  Urine Drug Screen, Qualitative     Status: Abnormal   Collection Time: 02/26/18  6:48 PM  Result Value Ref Range   Tricyclic, Ur Screen NONE DETECTED NONE DETECTED   Amphetamines, Ur Screen NONE DETECTED NONE DETECTED   MDMA (Ecstasy)Ur Screen NONE DETECTED NONE DETECTED   Cocaine Metabolite,Ur [place] NONE DETECTED NONE DETECTED   Opiate, Ur Screen NONE DETECTED NONE DETECTED   Phencyclidine (PCP) Ur S NONE DETECTED NONE DETECTED   Cannabinoid 50 Ng, Ur [place] POSITIVE (A) NONE DETECTED   Barbiturates, Ur Screen NONE DETECTED NONE DETECTED   Benzodiazepine, Ur Scrn NONE DETECTED NONE DETECTED   Methadone Scn, Ur NONE DETECTED NONE DETECTED    Comment: (NOTE) Tricyclics +  metabolites, urine    Cutoff 1000 ng/mL Amphetamines + metabolites, urine  Cutoff 1000 ng/mL MDMA (Ecstasy), urine              Cutoff 500 ng/mL Cocaine Metabolite, urine          Cutoff 300 ng/mL Opiate + metabolites, urine        Cutoff 300 ng/mL Phencyclidine (PCP), urine         Cutoff 25 ng/mL Cannabinoid, urine                 Cutoff 50 ng/mL Barbiturates + metabolites, urine  Cutoff 200 ng/mL Benzodiazepine, urine              Cutoff 200 ng/mL Methadone, urine                   Cutoff 300 ng/mL The urine drug screen provides only a preliminary, unconfirmed analytical test result and should not be used for non-medical purposes. Clinical consideration and professional judgment should be applied to any positive drug screen result due to possible interfering substances. A more specific alternate chemical method must be used in order to obtain a confirmed analytical result. Gas chromatography / mass spectrometry (GC/MS) is the preferred confirmat ory method. Performed at [facility], [address] [place], [place] [address]   Comprehensive metabolic panel     Status:  Abnormal   Collection Time: 02/26/18  6:49 PM  Result Value Ref Range   Sodium 140 135 - 145 mmol/L   Potassium 4.0 3.5 - 5.1 mmol/L   Chloride 109 101 - 111 mmol/L   CO2 24 22 - 32 mmol/L   Glucose, Bld 109 (H) 65 - 99 mg/dL   BUN 14 6 - 20 mg/dL   Creatinine, Ser 1.12 0.61 - 1.24 mg/dL   Calcium 9.1 8.9 - 10.3 mg/dL   Total Protein 6.9 6.5 - 8.1 g/dL   Albumin 3.8 3.5 - 5.0 g/dL   AST 32 15 - 41 U/L   ALT 22 17 - 63 U/L   Alkaline Phosphatase 33 (L) 38 - 126 U/L   Total Bilirubin 0.5 0.3 - 1.2 mg/dL   GFR calc non Af Amer >60 >60 mL/min   GFR calc Af Amer >60 >60 mL/min    Comment: (NOTE) The eGFR has been calculated using the CKD EPI equation. This calculation has not been validated in all clinical situations. eGFR's persistently <60 mL/min signify possible Chronic Kidney Disease.    Anion gap 7 5 - 15    Comment: Performed at [facility], [address] [place], [place] [address]  Ethanol     Status: None   Collection Time: 02/26/18  6:49 PM  Result Value Ref Range   Alcohol, Ethyl (B) <10 <10 mg/dL    Comment:        LOWEST DETECTABLE LIMIT FOR SERUM ALCOHOL IS 10 mg/dL FOR MEDICAL PURPOSES ONLY Performed at [facility], [address] [place], [place] [address]   Salicylate level     Status: None   Collection Time: 02/26/18  6:49 PM  Result Value Ref Range   Salicylate Lvl <7.9 2.8 - 30.0 mg/dL    Comment: Performed at [facility], [address] [place], [place] [address]  Acetaminophen level     Status: Abnormal   Collection Time: 02/26/18  6:49 PM  Result Value Ref Range   Acetaminophen (Tylenol), Serum <10 (L) 10 - 30 ug/mL    Comment:  THERAPEUTIC CONCENTRATIONS VARY SIGNIFICANTLY. A RANGE OF 10-30 ug/mL MAY BE AN EFFECTIVE CONCENTRATION FOR MANY PATIENTS. HOWEVER, SOME ARE BEST TREATED AT CONCENTRATIONS OUTSIDE THIS RANGE. ACETAMINOPHEN CONCENTRATIONS >150 ug/mL AT 4 HOURS AFTER INGESTION AND >50 ug/mL AT  12 HOURS AFTER INGESTION ARE OFTEN ASSOCIATED WITH TOXIC REACTIONS. Performed at [facility], [address] [place], [place] [address]   cbc     Status: Abnormal   Collection Time: 02/26/18  6:49 PM  Result Value Ref Range   WBC 6.1 3.8 - 10.6 K/uL   RBC 4.08 (L) 4.40 - 5.90 MIL/uL   Hemoglobin 12.7 (L) 13.0 - 18.0 g/dL   HCT 37.8 (L) 40.0 - 52.0 %   MCV 92.9 80.0 - 100.0 fL   MCH 31.2 26.0 - 34.0 pg   MCHC 33.6 32.0 - 36.0 g/dL   RDW 13.9 11.5 - 14.5 %   Platelets 167 150 - 440 K/uL    Comment: Performed at [facility], [address], [place], [place] [address]    Current Facility-Administered Medications  Medication Dose Route Frequency Provider Last Rate Last Dose  . diazepam (VALIUM) tablet 10 mg  10 mg Oral Once Earleen Newport, MD      . divalproex (DEPAKOTE) DR tablet 1,000 mg  1,000 mg Oral Q12H Clapacs, John T, MD      . OLANZapine (ZYPREXA) tablet 10 mg  10 mg Oral QHS Clapacs, John T, MD      . propranolol ER (INDERAL LA) 24 hr capsule 60 mg  60 mg Oral Daily Clapacs, John T, MD      . risperiDONE (RISPERDAL) tablet 3 mg  3 mg Oral QHS Clapacs, Madie Reno, MD       Current Outpatient Medications  Medication Sig Dispense Refill  . divalproex (DEPAKOTE) 250 MG DR tablet Take 1 tablet (250 mg total) by mouth 2 (two) times daily. 250 mg in the morning and 500 mg at bedtime (Patient taking differently: Take 500-1,000 mg by mouth 2 (two) times daily. ) 90 tablet 0  . lamoTRIgine (LAMICTAL) 100 MG tablet Take 100-200 mg by mouth daily.     Marland Kitchen OLANZapine (ZYPREXA) 5 MG tablet Take 5 mg by mouth at bedtime.     . propranolol (INDERAL) 60 MG tablet Take 60 mg by mouth daily.    . risperiDONE (RISPERDAL) 3 MG tablet Take 3 mg by mouth at bedtime.    . Vitamin D, Ergocalciferol, (DRISDOL) 50000 units CAPS capsule Take 50,000 Units by mouth every 7 (seven) days.    Marland Kitchen acetaminophen (TYLENOL) 325 MG tablet Take 2 tablets (650 mg total) by mouth every 6 (six)  hours as needed for mild pain or fever (or Fever >/= 101). (Patient not taking: Reported on 02/27/2018) 20 tablet 0  . ibuprofen (ADVIL,MOTRIN) 400 MG tablet Take 1 tablet (400 mg total) by mouth every 6 (six) hours as needed for fever. (Patient not taking: Reported on 02/27/2018) 30 tablet 0  . LORazepam (ATIVAN) 1 MG tablet Take 1 tablet (1 mg total) by mouth 2 (two) times daily as needed for seizure (or shaking). (Patient not taking: Reported on 02/27/2018) 10 tablet 0    Musculoskeletal: Strength & Muscle Tone: within normal limits Gait & Station: normal Patient leans: N/A  Psychiatric Specialty Exam: Physical Exam  Constitutional: He appears well-developed and well-nourished.  HENT:  Head: Normocephalic and atraumatic.  Eyes: Pupils are equal, round, and reactive to light. Conjunctivae are normal.  Neck: Normal range of  motion.  Cardiovascular: Normal heart sounds.  Respiratory: Effort normal.  GI: Soft.  Musculoskeletal: Normal range of motion.  Neurological: He is alert.  Skin: Skin is warm and dry.  Psychiatric: His affect is blunt. His speech is delayed. He is slowed. Thought content is paranoid and delusional. Cognition and memory are impaired. He expresses impulsivity.    Review of Systems  Constitutional: Negative.   HENT: Negative.   Eyes: Negative.   Respiratory: Negative.   Cardiovascular: Negative.   Gastrointestinal: Negative.   Musculoskeletal: Negative.   Skin: Negative.   Neurological: Negative.   Psychiatric/Behavioral: Positive for hallucinations and substance abuse. Negative for depression, memory loss and suicidal ideas. The patient is nervous/anxious and has insomnia.     Blood pressure 124/74, pulse 93, temperature 99.1 F (37.3 C), temperature source Oral, resp. rate 16, height 6' (1.829 m), weight 104.3 kg (230 lb), SpO2 100 %.Body mass index is 31.19 kg/m.  General Appearance: Casual  Eye Contact:  Good  Speech:  Slow  Volume:  Decreased  Mood:   Euthymic  Affect:  Constricted  Thought Process:  Goal Directed  Orientation:  Full (Time, Place, and Person)  Thought Content:  Illogical, Delusions, Hallucinations: Tactile and Paranoid Ideation  Suicidal Thoughts:  No  Homicidal Thoughts:  No  Memory:  Immediate;   Fair Recent;   Fair Remote;   Fair  Judgement:  Impaired  Insight:  Shallow  Psychomotor Activity:  Normal  Concentration:  Concentration: Fair  Recall:  [employer] of Knowledge:  Fair  Language:  Fair  Akathisia:  No  Handed:  Right  AIMS (if indicated):     Assets:  Desire for Improvement Housing Physical Health  ADL's:  Intact  Cognition:  WNL  Sleep:        Treatment Plan Summary: Daily contact with patient to assess and evaluate symptoms and progress in treatment, Medication management and Plan Patient is having some worsening psychotic symptoms and behavior problems.  He will talking about the end of the world and very guarded and clearly not revealing everything to me about his delusions.  Patient will be maintained on the involuntary commitment and we will admit him to the psychiatric ward.  Patient appears to usually be on Zyprexa and Risperdal and we will continue both of these as well as his Depakote.  Unclear whether he is actually taking the lamotrigine along with the Depakote so I will hold off on that.  Full set of labs will be done.  15-minute checks.  Disposition: Recommend psychiatric Inpatient admission when medically cleared. Supportive therapy provided about ongoing stressors.  Alethia Berthold, MD 02/27/2018 2:55 PM

## 2018-02-27 NOTE — H&P (Signed)
Psychiatric Admission Assessment Adult  Patient Identification: Daniel Craig MRN:  [id number] Date of Evaluation:  02/28/2018 Chief Complaint:  schizophrenia Principal Diagnosis: Schizophrenia, paranoid (HCC) Diagnosis:   Patient Active Problem List   Diagnosis Date Noted  . Schizophrenia, paranoid (HCC) [F20.0] 02/27/2018    Priority: High  . Cannabis use disorder, moderate, dependence (HCC) [F12.20] 02/27/2018  . Tobacco use disorder [F17.200] 02/27/2018  . 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 Altered mental status [R41.82] 12/28/2017  . Fever [R50.9] 08/11/2017  . Acute URI [J06.9] 08/11/2017  . Delirium [R41.0] 06/25/2017  . Schizophrenia (HCC) [F20.9] 06/25/2017  . Seizures (HCC) [R56.9] 06/25/2017   History of Present Illness:   Identifying data. Daniel Craig is a 27 year old male with a history of seizure disorder and schizophrenia.  Chief complaint. "I am fine."  History of present illness. Information was obtained from the patient and the chart. The patient came to the ER psychotic predicting the "the world will come to an end at midnight and that there will be fire". He was disorganized in his thinking and was admitted to psychiatry. He does have a diagnosis of schizophrenia and has been compliant with his medications of Zyprexa and Risperdal prescribed by his primary psychiatrist. The patient also has a history of seizures. Repertedly, on the day of admission he was smoking cannabis at a friend's house and had an episode of shaking. He then became strange and made delusional statements. His father who is also his guardian informed Daniel Craig that the patient has postictal psychotic episodes. He uses cannabis but no alcohol or other drugs.  At the time of my evaluation, the patient is no longer psychotic. He denies symptoms of depression, anxiety or bipolar mania. He is pleasanr polite and cooperative. He has good sense of humor. He is sad that, again, he has not been seizure free  for 6 months to get his drivers license.  Past psychiatric history. Diagnosed with schizophrenia or schizoaffective disorder. He has been tried on multi[ple medications. Zyprexa and Risperdal combination work well. Denies suicide attempts. Follows up with [facility]. [facility].   Family psychiatric history. None reported.  Social history. He is incompetent adult and his father is the guardian. He used to live in a group home but few months ago he moved to independent apartment. He goes to day program and has support from [name] peer support team.  Total Time spent with patient: 1 hour  Is the patient at risk to self? No.  Has the patient been a risk to self in the past 6 months? No.  Has the patient been a risk to self within the distant past? No.  Is the patient a risk to others? No.  Has the patient been a risk to others in the past 6 months? No.  Has the patient been a risk to others within the distant past? No.   Prior Inpatient Therapy:   Prior Outpatient Therapy:    Alcohol Screening: 1. How often do you have a drink containing alcohol?: Monthly or less 2. How many drinks containing alcohol do you have on a typical day when you are drinking?: 1 or 2 3. How often do you have six or more drinks on one occasion?: Never AUDIT-C Score: 1 4. How often during the last year have you found that you were not able to stop drinking once you had started?: Never 5. How often during the last year have you failed to do what was  normally expected from you becasue of drinking?: Never 6. How often during the last year have you needed a first drink in the morning to get yourself going after a heavy drinking session?: Never 7. How often during the last year have you had a feeling of guilt of remorse after drinking?: Never 8. How often during the last year have you been unable to remember what happened the night before because you had been drinking?: Never 9. Have you  or someone else been injured as a result of your drinking?: No 10. Has a relative or friend or a doctor or another health worker been concerned about your drinking or suggested you cut down?: No Alcohol Use Disorder Identification Test Final Score (AUDIT): 1 Intervention/Follow-up: AUDIT Score <7 follow-up not indicated Substance Abuse History in the last 12 months:  Yes.   Consequences of Substance Abuse: Negative Previous Psychotropic Medications: Yes  Psychological Evaluations: No  Past Medical History:  Past Medical History:  Diagnosis Date  . Schizophrenia (HCC)   . Seizures (HCC)     Past Surgical History:  Procedure Laterality Date  . MOUTH SURGERY     Family History:  Family History  Family history unknown: Yes   Tobacco Screening: Have you used any form of tobacco in the last 30 days? (Cigarettes, Smokeless Tobacco, Cigars, and/or Pipes): Yes Tobacco use, Select all that apply: 5 or more cigarettes per day Are you interested in Tobacco Cessation Medications?: No, patient refused Counseled patient on smoking cessation including recognizing danger situations, developing coping skills and basic information about quitting provided: Refused/Declined practical counseling Social History:  Social History   Substance and Sexual Activity  Alcohol Use Yes  . Alcohol/week: 2.4 oz  . Types: 4 Shots of liquor per week   Comment: pt states 4 shots of liqour oer week     Social History   Substance and Sexual Activity  Drug Use Yes  . Types: Marijuana    Additional Social History:    Specify valuables returned: cell phone, life alert, set of keys, box of newports, [place] ID, SSC, EBT, black wallet, $13>00, black tobagon                      Allergies:   Allergies  Allergen Reactions  . Keppra [Levetiracetam]   . Trileptal [Oxcarbazepine]   . Vimpat [Lacosamide]    Lab Results:  Results for orders placed or performed during the hospital encounter of 02/27/18 (from  the past 48 hour(s))  Lipid panel     Status: None   Collection Time: 02/28/18  8:25 AM  Result Value Ref Range   Cholesterol 128 0 - 200 mg/dL   Triglycerides 83 <109 mg/dL   HDL 47 >60 mg/dL   Total CHOL/HDL Ratio 2.7 RATIO   VLDL 17 0 - 40 mg/dL   LDL Cholesterol 64 0 - 99 mg/dL    Comment:        Total Cholesterol/HDL:CHD Risk Coronary Heart Disease Risk Table                     Men   Women  1/2 Average Risk   3.4   3.3  Average Risk       5.0   4.4  2 X Average Risk   9.6   7.1  3 X Average Risk  23.4   11.0        Use the calculated Patient Ratio above and the CHD Risk  Table to determine the patient's CHD Risk.        ATP III CLASSIFICATION (LDL):  <100     mg/dL   Optimal  161-096  mg/dL   Near or Above                    Optimal  130-159  mg/dL   Borderline  045-409  mg/dL   High  >811     mg/dL   Very High Performed at [facility], [address] [place], [place] [address]   TSH     Status: None   Collection Time: 02/28/18  8:25 AM  Result Value Ref Range   TSH 2.895 0.350 - 4.500 uIU/mL    Comment: Performed by a 3rd Generation assay with a functional sensitivity of <=0.01 uIU/mL. Performed at [facility], [address] [place], [place] [address]     Blood Alcohol level:  Lab Results  Component Value Date   [facility] <10 02/26/2018   ETH <10 12/28/2017    Metabolic Disorder Labs:  No results found for: HGBA1C, MPG No results found for: PROLACTIN Lab Results  Component Value Date   CHOL 128 02/28/2018   TRIG 83 02/28/2018   HDL 47 02/28/2018   CHOLHDL 2.7 02/28/2018   VLDL 17 02/28/2018   LDLCALC 64 02/28/2018    Current Medications: Current Facility-Administered Medications  Medication Dose Route Frequency Provider Last Rate Last Dose  . acetaminophen (TYLENOL) tablet 650 mg  650 mg Oral Q6H PRN Clapacs, John T, MD      . alum & mag hydroxide-simeth (MAALOX/MYLANTA) 200-200-20 MG/5ML suspension 30 mL  30 mL Oral Q4H PRN  Clapacs, John T, MD      . divalproex (DEPAKOTE) DR tablet 1,000 mg  1,000 mg Oral Q12H Clapacs, John T, MD   1,000 mg at 02/28/18 0827  . hydrOXYzine (ATARAX/VISTARIL) tablet 50 mg  50 mg Oral TID PRN Clapacs, John T, MD      . lamoTRIgine (LAMICTAL) tablet 100 mg  100 mg Oral BH-q7a Daniel Ibe B, MD   100 mg at 02/28/18 0617  . lamoTRIgine (LAMICTAL) tablet 200 mg  200 mg Oral QHS Daniel Uhlir B, MD   200 mg at 02/27/18 2014  . magnesium hydroxide (MILK OF MAGNESIA) suspension 30 mL  30 mL Oral Daily PRN Clapacs, John T, MD      . OLANZapine (ZYPREXA) tablet 10 mg  10 mg Oral QHS Clapacs, Jackquline Denmark, MD   10 mg at 02/27/18 2014  . propranolol ER (INDERAL LA) 24 hr capsule 60 mg  60 mg Oral Daily Clapacs, Jackquline Denmark, MD   60 mg at 02/28/18 0827  . risperiDONE (RISPERDAL) tablet 3 mg  3 mg Oral QHS Clapacs, Jackquline Denmark, MD   3 mg at 02/27/18 2014  . traZODone (DESYREL) tablet 100 mg  100 mg Oral QHS PRN Clapacs, Jackquline Denmark, MD   100 mg at 02/27/18 2014   PTA Medications: Medications Prior to Admission  Medication Sig Dispense Refill Last Dose  . acetaminophen (TYLENOL) 325 MG tablet Take 2 tablets (650 mg total) by mouth every 6 (six) hours as needed for mild pain or fever (or Fever >/= 101). (Patient not taking: Reported on 02/27/2018) 20 tablet 0 Not Taking at Unknown time  . divalproex (DEPAKOTE) 250 MG DR tablet Take 1 tablet (250 mg total) by mouth 2 (two) times daily. 250 mg in the morning and 500 mg at bedtime (Patient taking  differently: Take 500-1,000 mg by mouth 2 (two) times daily. ) 90 tablet 0 Unknown at Unknown  . ibuprofen (ADVIL,MOTRIN) 400 MG tablet Take 1 tablet (400 mg total) by mouth every 6 (six) hours as needed for fever. (Patient not taking: Reported on 02/27/2018) 30 tablet 0 Not Taking at Unknown time  . lamoTRIgine (LAMICTAL) 100 MG tablet Take 100-200 mg by mouth daily.    Unknown at Unknown  . LORazepam (ATIVAN) 1 MG tablet Take 1 tablet (1 mg total) by mouth 2 (two) times  daily as needed for seizure (or shaking). (Patient not taking: Reported on 02/27/2018) 10 tablet 0 Not Taking at Unknown time  . OLANZapine (ZYPREXA) 5 MG tablet Take 5 mg by mouth at bedtime.    Unknown at Unknown  . propranolol (INDERAL) 60 MG tablet Take 60 mg by mouth daily.     . risperiDONE (RISPERDAL) 3 MG tablet Take 3 mg by mouth at bedtime.   Unknown at Unknown  . Vitamin D, Ergocalciferol, (DRISDOL) 50000 units CAPS capsule Take 50,000 Units by mouth every 7 (seven) days.       Musculoskeletal: Strength & Muscle Tone: within normal limits Gait & Station: normal Patient leans: N/A  Psychiatric Specialty Exam: I reviewed physical exam performed in the ER and agree with the findings. Physical Exam  Nursing note and vitals reviewed. Psychiatric: He has a normal mood and affect. His speech is normal and behavior is normal. Thought content normal. Cognition and memory are normal. He expresses impulsivity.    Review of Systems  Neurological: Positive for seizures.  Psychiatric/Behavioral: Negative.   All other systems reviewed and are negative.   Blood pressure 110/75, pulse 87, temperature 98.1 F (36.7 C), temperature source Oral, resp. rate 19, height 6' (1.829 m), weight 98 kg (216 lb), SpO2 100 %.Body mass index is 29.29 kg/m.  See SRA                                                  Sleep:  Number of Hours: 7    Treatment Plan Summary: Daily contact with patient to assess and evaluate symptoms and progress in treatment and Medication management   Daniel Craig is a 27 year old male with a history of psychosis and seizures admitted for a psychotic break, possibly postictal psychosis. The patient came to the ER after a "shaking" episode at a friend's house where he was smoking cannabis. According to his father, following seizure the patient remains psychotic and sometimes homicidal for a day or two.  #Psychosis, resolved -continue Risperdal 3 mg and  Zyprexa 10 mg nightly as in the community  #Seizures, seizures on 02/27/2018 -continue Depakote 1500 mg daily and Lamictal 300 mg daily as in the community -VPA leve; pending  #Insomnia -Trazodone 100 mg PRN  #HTN -Propranolol 60 mg daily  #Cannabis use -patient minimizes problem and declines treatment  #Metabolic syndrome monitoring -lipid panel and TSH are normal, HgbA1C pending -EKG, QTc 388  #Smoking cessation -nicotine patch was available  #Social -incompetent adult, father Daniel Craig is the guardian  #Disposition -discharge back to his apartment -follow up with ACT team    Observation Level/Precautions:  15 minute checks  Laboratory:  CBC Chemistry Profile UDS UA  Psychotherapy:    Medications:    Consultations:    Discharge Concerns:    Estimated LOS:  Other:     Physician Treatment Plan for Primary Diagnosis: Schizophrenia, paranoid (HCC) Long Term Goal(s): Improvement in symptoms so as ready for discharge  Short Term Goals: Ability to identify changes in lifestyle to reduce recurrence of condition will improve, Ability to verbalize feelings will improve, Ability to disclose and discuss suicidal ideas, Ability to demonstrate self-control will improve, Ability to identify and develop effective coping behaviors will improve, Ability to maintain clinical measurements within normal limits will improve, Compliance with prescribed medications will improve and Ability to identify triggers associated with substance abuse/mental health issues will improve  Physician Treatment Plan for Secondary Diagnosis: Principal Problem:   Schizophrenia, paranoid (HCC) Active Problems:   Seizures (HCC)   Cannabis use disorder, moderate, dependence (HCC)   Tobacco use disorder   Postictal psychosis  Long Term Goal(s): Improvement in symptoms so as ready for discharge  Short Term Goals: Ability to identify changes in lifestyle to reduce recurrence of condition will improve,  Ability to demonstrate self-control will improve and Ability to identify triggers associated with substance abuse/mental health issues will improve  I certify that inpatient services furnished can reasonably be expected to improve the patient's condition.    Kristine Linea, MD 4/5/201911:49 AM

## 2018-02-27 NOTE — ED Notes (Addendum)

## 2018-02-27 NOTE — BH Assessment (Signed)
Patient is to be admitted to [facility] BMU by Dr. Toni Amendlapacs.  Attending Physician will be Dr. Jennet MaduroPucilowska.   Patient has been [facility] to room 311, [facility] Randa EvensJoanne.   Intake Paper Work has been signed and placed on patient chart.  ER staff is aware of the admission:  Derrek MonacoEmily,ER Sectary   Dr. Mayford KnifeWilliams, ER MD   Everardo PacificKenisha, Patient's Nurse   Ethelene BrownsAnthony, Patient Access.

## 2018-02-27 NOTE — Plan of Care (Addendum)
Patient found in common area upon my arrival. Patient is visible but not social with peers. Patient had a visitor and requests that writer speak with her. Patient reports that he has no idea why he is here. Denies SI/HI/AVH. States, "I would never hurt anyone or myself." Denies hearing the voice of God. Denies thinking the world will end. Denies depression and anxiety. Reports compliance with medications. Encouraged patient to speak with treatment team in the morning regarding his IVC. Patient agrees. Patient is calm and pleasant throughout interaction. Was not observed responding to internal stimuli. Requests medication early so he can go to bed. Compliant with HS medication and staff direction. Given Trazodone for sleep. Will monitor for efficacy. Q 15 minute checks maintained. Will continue to monitor throughout the shift. Patient slept 7 hours. No apparent distress. Will endorse care to oncoming shift.  Problem: Education: Goal: Knowledge of [facility] Education information/materials will improve Outcome: Not Progressing Goal: Mental status will improve Outcome: Not Progressing   Problem: Activity: Goal: Interest or engagement in activities will improve Outcome: Not Progressing   Problem: Coping: Goal: Ability to verbalize frustrations and anger appropriately will improve Outcome: Not Progressing   Problem: Health Behavior/Discharge Planning: Goal: Compliance with treatment plan for underlying cause of condition will improve Outcome: Not Progressing   Problem: Safety: Goal: Periods of time without injury will increase Outcome: Not Progressing   Problem: Elimination: Goal: Will not experience complications related to bowel motility Outcome: Not Progressing

## 2018-02-27 NOTE — BH Assessment (Signed)
Per Dr. Clapac's meet criteria for inpatient psychiatric treatment. 

## 2018-02-27 NOTE — BHH Suicide Risk Assessment (Signed)
[facility]   Nursing information obtained from:  Patient Demographic factors:  Male Current Mental Status:  NA Loss Factors:  NA Historical Factors:  Impulsivity Risk Reduction Factors:  Religious beliefs about death, Positive social support  Total Time spent with patient: 1 hour Principal Problem: Schizophrenia, paranoid (HCC) Diagnosis:   Patient Active Problem List   Diagnosis Date Noted  . Schizophrenia, paranoid (HCC) [F20.0] 02/27/2018    Priority: High  . Cannabis use disorder, moderate, dependence (HCC) [F12.20] 02/27/2018  . Tobacco use disorder [F17.200] 02/27/2018  . 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 Altered mental status [R41.82] 12/28/2017  . Fever [R50.9] 08/11/2017  . Acute URI [J06.9] 08/11/2017  . Delirium [R41.0] 06/25/2017  . Schizophrenia (HCC) [F20.9] 06/25/2017  . Seizures (HCC) [R56.9] 06/25/2017   Subjective Data: psychotic break  Continued Clinical Symptoms:  Alcohol Use Disorder Identification Test Final Score (AUDIT): 1 The "Alcohol Use Disorders Identification Test", Guidelines for Use in Primary Care, Second Edition.  World Science writerHealth [employer] [facility](WHO). Score between 0-7:  no or low risk or alcohol related problems. Score between 8-15:  moderate risk of alcohol related problems. Score between 16-19:  high risk of alcohol related problems. Score 20 or above:  warrants further diagnostic evaluation for alcohol dependence and treatment.   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 Psychiatric: He has a normal mood and affect. His speech is normal and behavior is normal. Thought content normal. Cognition and memory are normal. He expresses impulsivity.    Review of  Systems  Neurological: Positive for seizures.  Psychiatric/Behavioral: Negative.   All other systems reviewed and are negative.   Blood pressure 110/75, pulse 87, temperature 98.1 F (36.7 C), temperature source Oral, resp. rate 19, height 6' (1.829 m), weight 98 kg (216 lb), SpO2 100 %.Body mass index is 29.29 kg/m.  General Appearance: Casual  Eye Contact:  Good  Speech:  Clear and Coherent  Volume:  Normal  Mood:  Euthymic  Affect:  Appropriate  Thought Process:  Goal Directed and Descriptions of Associations: Intact  Orientation:  Full (Time, Place, and Person)  Thought Content:  WDL  Suicidal Thoughts:  No  Homicidal Thoughts:  No  Memory:  Immediate;   Fair Recent;   Fair Remote;   Fair  Judgement:  Fair  Insight:  Present  Psychomotor Activity:  Normal  Concentration:  Concentration: Fair and Attention Span: Fair  Recall:  [employer]Fair  Fund of Knowledge:  Fair  Language:  Fair  Akathisia:  No  Handed:  Right  AIMS (if indicated):     Assets:  Communication Skills Desire for Improvement Financial Resources/Insurance Housing Resilience Social Support  ADL's:  Intact  Cognition:  WNL  Sleep:  Number of Hours: 7      COGNITIVE FEATURES THAT CONTRIBUTE TO RISK:  None    SUICIDE RISK:   Minimal: No identifiable suicidal ideation.  Patients presenting with no risk factors but with morbid ruminations; may be classified as minimal risk based on the severity of the depressive symptoms  PLAN OF CARE: hospital admission, medication management, substance abuse counseling, discharge planning.  [name] is a 27 year old male with a history of psychosis and seizures admitted for a psychotic break, possibly postictal psychosis. The patient came to  the ER after a "shaking" episode at a friend's house where he was smoking cannabis. According to his father, following seizure the patient remains psychotic and sometimes homicidal for a day or two.  #Psychosis, resolved -continue  Risperdal 3 mg and Zyprexa 10 mg nightly as in the community  #Seizures, seizures on 02/27/2018 -continue Depakote 1500 mg daily and Lamictal 300 mg daily as in the community -VPA leve; pending  #Insomnia -Trazodone 100 mg PRN  #HTN -Propranolol 60 mg daily  #Cannabis use -patient minimizes problem and declines treatment  #Metabolic syndrome monitoring -lipid panel and TSH are normal, HgbA1C pending -EKG, QTc 388  #Smoking cessation -nicotine patch was available  #Social -incompetent adult, father Jeziel Hoffmann is the guardian  #Disposition -discharge back to his apartment -follow up with ACT team    I certify that inpatient services furnished can reasonably be expected to improve the patient's condition.   Kristine Linea, MD 02/28/2018, 10:12 AM

## 2018-02-27 NOTE — BH Assessment (Signed)
This Clinical research associatewriter contacted patient's legal guardian, Smiley HousemanRonnie Hector (Father) at [phone] to inform him [facility]

## 2018-02-27 NOTE — ED Notes (Signed)
Patient's cousin Juanell Fairly(Alicia Walker [phone]) called to speak with patient.  Advised caller that I could not confirm or deny his admission on the unit without a passcode.

## 2018-02-27 NOTE — ED Notes (Signed)
Preparing patient for transfer to BMU 

## 2018-02-27 NOTE — ED Notes (Signed)
ED Is the patient under IVC or is there intent for IVC: Yes.   Is the patient medically cleared: Yes.   Is there vacancy in the ED BHU: Yes.   Is the population mix appropriate for patient: Yes.   Is the patient awaiting placement in inpatient or outpatient setting: Yes.   Has the patient had a psychiatric consult:  Reevaluation pending  Survey of unit performed for contraband, proper placement and condition of furniture, tampering with fixtures in bathroom, shower, and each patient room: Yes.  ; Findings:  APPEARANCE/BEHAVIOR  cooperative NEURO ASSESSMENT Orientation: oriented x4  Denies pain Hallucinations: No.None noted (Hallucinations) Speech: Normal Gait: normal  RESPIRATORY ASSESSMENT Even  Unlabored respirations  CARDIOVASCULAR ASSESSMENT Pulses equal   regular rate  Skin warm and dry   GASTROINTESTINAL ASSESSMENT no GI complaint EXTREMITIES Full ROM  PLAN OF CARE Provide calm/safe environment. Vital signs assessed twice daily. ED BHU Assessment once each 12-hour shift. Collaborate with TTS daily or as condition indicates. Assure the ED provider has rounded once each shift. Provide and encourage hygiene. Provide redirection as needed. Assess for escalating behavior; address immediately and inform ED provider.  Assess family dynamic and appropriateness for visitation as needed: Yes.  ; If necessary, describe findings:  Educate the patient/family about BHU procedures/visitation: Yes.  ; If necessary, describe findings:

## 2018-02-27 NOTE — ED Notes (Signed)
Offered pt diazepam to ease his anxiety until he is seen by psychiatry for a reevaluation today  Pt declines wanting the medication stating  "I am fine - I am calm - I do not want the medication - I am ready to go - I do not want to talk to a psychiatrist."  Pt educated about his IVC and the doctor requires him to see the psychiatrist and receive safety clearance before he can go home  Pt shakes his head at me  - receiving RN informed  Pt to move to behavioral holding unit to hold for psych consult

## 2018-02-27 NOTE — ED Notes (Signed)
Pt is resting at this time

## 2018-02-27 NOTE — ED Notes (Signed)
[facility]  Patient observed lying in bed with eyes closed  Even, unlabored respirations observed   NAD pt appears to be sleeping  Per report pt was not easily arouse able after receiving IM injections  - once awake and WDL pt to transfer to behavioral holding unit  - pending psych eval  IVC     I will continue to monitor along with every 15 minute visual observations and ongoing security  monitoring

## 2018-02-28 DIAGNOSIS — F200 Paranoid schizophrenia: Principal | ICD-10-CM

## 2018-02-28 LAB — LIPID PANEL
Cholesterol: 128 mg/dL (ref 0–200)
HDL: 47 mg/dL (ref >40)
LDL CALC: 64 mg/dL (ref 0–99)
Total CHOL/HDL Ratio: 2.7 RATIO
Triglycerides: 83 mg/dL (ref <150)
VLDL: 17 mg/dL (ref 0–40)

## 2018-02-28 LAB — VALPROIC ACID LEVEL: VALPROIC ACID LVL: 107 ug/mL — AB (ref 50.0–100.0)

## 2018-02-28 LAB — HEMOGLOBIN A1C
HEMOGLOBIN A1C: 5 % (ref 4.8–5.6)
MEAN PLASMA GLUCOSE: 96.8 mg/dL

## 2018-02-28 LAB — TSH: TSH: 2.895 u[IU]/mL (ref 0.350–4.500)

## 2018-02-28 NOTE — Progress Notes (Signed)
Recreation Therapy Notes  INPATIENT RECREATION THERAPY ASSESSMENT  Patient Details Name: Daniel Craig MRN: [id number] DOB: 1991-02-11 Today's Date: 02/28/2018       Information Obtained From: Patient  Able to Participate in Assessment/Interview: Yes  Patient Presentation: Responsive, Alert, Oriented  Reason for Admission (Per Patient): Active Symptoms(I had a spiritual moment)  Patient Stressors: Family, Friends(People doing stupid Therapist, musicstuff.)  Coping Skills:   Talk, Exercise, Substance Abuse, Music  Leisure Interests (2+):  Exercise - Walking, Social - Friends, Music - Listen(Smoke weed)  Frequency of Recreation/Participation: Weekly  Awareness of Community Resources:  Yes  Community Resources:  Energy manager([place] [employer])  Current Use: Yes  If no, Barriers?:    Expressed Interest in [employer] Resource Information: No  [place] of [place]idence:  [place]  Patient Main Form of Transportation: Therapist, musicublic Transportation  Patient Strengths:  I can read energy  Patient Identified Areas of Improvement:  To spend more time with my daughter  Patient Goal for Hospitalization:  To leave  Current SI (including self-harm):  No  Current HI:  No  Current AVH: No  Staff Intervention Plan: Group Attendance, Collaborate with Interdisciplinary Treatment Team  Consent to Intern Participation: N/A  Daniel Craig 02/28/2018, 12:37 PM

## 2018-02-28 NOTE — Progress Notes (Signed)
Recreation Therapy Notes  Date: 02/28/2018  Time: 9:30 am  Location: Craft Room  Behavioral response: Appropriate  Intervention Topic: Leisure  Discussion/Intervention: Group content today was focused on leisure. The group defined what leisure is and some positive leisure activities they participate in. Individuals identified the difference between good and bad leisure. Participants expressed how they feel after participating in the leisure of their choice. The group discussed how they go about picking a leisure activity and if others are involved in their leisure activities. The patient stated how many leisure activities they too choose from and reasons why it is important to have leisure time. Individuals participated in the intervention "Leisure Jeopardy" where they had a chance to identify new leisure activities as well as benefits of leisure. Clinical Observations/Feedback:  Patient came to group and stated leisure is time to relax. He explained that it is important to have more than one leisure activity to participate in.Individual participated in the intervention and was social with peers and staff during group. Mariem Skolnick LRT/CTRS         Needham Biggins 02/28/2018 11:45 AM

## 2018-02-28 NOTE — Progress Notes (Signed)
  [facility] :  Will you be returning to the same living situation after discharge:  Yes,  returning home. At discharge, do you have transportation home?: Yes,  pt's peer support specialist Do you have the ability to pay for your medications: Yes,  pt' guardian.  Release of information consent forms completed and in the chart;  Patient's signature needed at discharge.  Patient to Follow up at: Follow-up Information    [facility] Go on 02/28/2018.   Why:  Your Office managereer Support Services with [facility] will resume immediately upon discharge, when your peer support specialist Cooper RenderBobbie Foster picks you up.  Contact information: [address] [place] [address] [phone]           Next level of care provider has access to [facility] Link:no  Safety Planning and Suicide Prevention discussed: Yes,  with pt's guardian.  Have you used any form of tobacco in the last 30 days? (Cigarettes, Smokeless Tobacco, Cigars, and/or Pipes): Yes  Has patient been referred to the Quitline?: Patient refused referral  Patient has been referred for addiction treatment: N/A  Heidi DachKelsey Ronnisha Felber, LCSW 02/28/2018, 11:18 AM

## 2018-02-28 NOTE — [facility] Group Notes (Signed)
02/28/2018 1PM  Type of Therapy and Topic:  Group Therapy:  Feelings around Relapse and Recovery  Participation Level:  Did Not Attend   Description of Group:    Patients in this group will discuss emotions they experience before and after a relapse. They will process how experiencing these feelings, or avoidance of experiencing them, relates to having a relapse. Facilitator will guide patients to explore emotions they have related to recovery. Patients will be encouraged to process which emotions are more powerful. They will be guided to discuss the emotional reaction significant others in their lives may have to patients' relapse or recovery. Patients will be assisted in exploring ways to respond to the emotions of others without this contributing to a relapse.  Therapeutic Goals: 1. Patient will identify two or more emotions that lead to a relapse for them 2. Patient will identify two emotions that result when they relapse 3. Patient will identify two emotions related to recovery 4. Patient will demonstrate ability to communicate their needs through discussion and/or role plays   Summary of Patient Progress: Patient was encouraged and invited to attend group. Patient did not attend group. Social worker will continue to encourage group participation in the future.    Therapeutic Modalities:   Cognitive Behavioral Therapy Solution-Focused Therapy Assertiveness Training Relapse Prevention Therapy   Sarissa Dern, LCSW 02/28/2018 1:53 PM    

## 2018-02-28 NOTE — Progress Notes (Signed)
CSW spoke with pt's peer support specialist at [facility], Cooper RenderBobbie Foster at [phone]. Karen KitchensBobbie reported he will pick pt up upon discharge and transport him home. He reported pt's services with [facility] will resume immediately upon discharge. CSW will continue to coordinate with pt's peer support specialist as needed for further updates/discharge planning.   Heidi DachKelsey Ambar Raphael, MSW, LCSW Clinical Social Worker 02/28/2018 9:41 AM

## 2018-02-28 NOTE — Progress Notes (Deleted)
Recreation Therapy Notes  Date: 02/28/2018  Time: 9:30 am  Location: Craft Room  Behavioral response: Appropriate  Intervention Topic: Leisure  Discussion/Intervention: Patient did not attend group. Clinical Observations/Feedback:  Patient did not attend group. Briane Birden LRT/CTRS          Cay Kath 02/28/2018 12:35 PM

## 2018-02-28 NOTE — Progress Notes (Signed)
Recreation Therapy Notes  INPATIENT RECREATION TR PLAN  Patient Details Name: Rishon Thilges MRN: [id number] DOB: [birth year] Today's Date: 02/28/2018  Rec Therapy Plan Is patient appropriate for Therapeutic Recreation?: Yes Treatment times per week: at least 3 Estimated Length of Stay: 5-7 days TR Treatment/Interventions: Group participation (Comment)  Discharge Criteria Pt will be discharged from therapy if:: Discharged Treatment plan/goals/alternatives discussed and agreed upon by:: Patient/family  Discharge Summary Short term goals set: Patient will identify 3 positive coping skills strategies to use post d/c within 5 recreation therapy group sessions. Short term goals met: Complete Progress toward goals comments: Groups attended Which groups?: Leisure education Reason goals not met: N/A Therapeutic equipment acquired: N/A Reason patient discharged from therapy: Discharge from hospital Pt/family agrees with progress & goals achieved: Yes Date patient discharged from therapy: 02/28/18   Avia Merkley 02/28/2018, 12:41 PM

## 2018-02-28 NOTE — Tx Team (Addendum)
Interdisciplinary Treatment and Diagnostic Plan Update  02/28/2018 Time of Session: 1030 Daniel Craig MRN: [id number]  Principal Diagnosis: Schizophrenia, paranoid (HCC)  Secondary Diagnoses: Principal Problem:   Schizophrenia, paranoid (HCC) Active Problems:   Seizures (HCC)   Cannabis use disorder, moderate, dependence (HCC)   Tobacco use disorder   Postictal psychosis   Current Medications:  Current Facility-Administered Medications  Medication Dose Route Frequency Provider Last Rate Last Dose  . acetaminophen (TYLENOL) tablet 650 mg  650 mg Oral Q6H PRN Clapacs, John T, MD      . alum & mag hydroxide-simeth (MAALOX/MYLANTA) 200-200-20 MG/5ML suspension 30 mL  30 mL Oral Q4H PRN Clapacs, John T, MD      . divalproex (DEPAKOTE) DR tablet 1,000 mg  1,000 mg Oral Q12H Clapacs, John T, MD   1,000 mg at 02/28/18 0827  . hydrOXYzine (ATARAX/VISTARIL) tablet 50 mg  50 mg Oral TID PRN Clapacs, John T, MD      . lamoTRIgine (LAMICTAL) tablet 100 mg  100 mg Oral BH-q7a Pucilowska, Jolanta B, MD   100 mg at 02/28/18 0617  . lamoTRIgine (LAMICTAL) tablet 200 mg  200 mg Oral QHS Pucilowska, Jolanta B, MD   200 mg at 02/27/18 2014  . magnesium hydroxide (MILK OF MAGNESIA) suspension 30 mL  30 mL Oral Daily PRN Clapacs, John T, MD      . OLANZapine (ZYPREXA) tablet 10 mg  10 mg Oral QHS Clapacs, Jackquline Denmark, MD   10 mg at 02/27/18 2014  . propranolol ER (INDERAL LA) 24 hr capsule 60 mg  60 mg Oral Daily Clapacs, Jackquline Denmark, MD   60 mg at 02/28/18 0827  . risperiDONE (RISPERDAL) tablet 3 mg  3 mg Oral QHS Clapacs, Jackquline Denmark, MD   3 mg at 02/27/18 2014  . traZODone (DESYREL) tablet 100 mg  100 mg Oral QHS PRN Clapacs, Jackquline Denmark, MD   100 mg at 02/27/18 2014   PTA Medications: Medications Prior to Admission  Medication Sig Dispense Refill Last Dose  . acetaminophen (TYLENOL) 325 MG tablet Take 2 tablets (650 mg total) by mouth every 6 (six) hours as needed for mild pain or fever (or Fever >/= 101). (Patient not  taking: Reported on 02/27/2018) 20 tablet 0 Not Taking at Unknown time  . divalproex (DEPAKOTE) 250 MG DR tablet Take 1 tablet (250 mg total) by mouth 2 (two) times daily. 250 mg in the morning and 500 mg at bedtime (Patient taking differently: Take 500-1,000 mg by mouth 2 (two) times daily. ) 90 tablet 0 Unknown at Unknown  . ibuprofen (ADVIL,MOTRIN) 400 MG tablet Take 1 tablet (400 mg total) by mouth every 6 (six) hours as needed for fever. (Patient not taking: Reported on 02/27/2018) 30 tablet 0 Not Taking at Unknown time  . lamoTRIgine (LAMICTAL) 100 MG tablet Take 100-200 mg by mouth daily.    Unknown at Unknown  . LORazepam (ATIVAN) 1 MG tablet Take 1 tablet (1 mg total) by mouth 2 (two) times daily as needed for seizure (or shaking). (Patient not taking: Reported on 02/27/2018) 10 tablet 0 Not Taking at Unknown time  . OLANZapine (ZYPREXA) 5 MG tablet Take 5 mg by mouth at bedtime.    Unknown at Unknown  . propranolol (INDERAL) 60 MG tablet Take 60 mg by mouth daily.     . risperiDONE (RISPERDAL) 3 MG tablet Take 3 mg by mouth at bedtime.   Unknown at Unknown  . Vitamin D, Ergocalciferol, (DRISDOL) 50000 units CAPS  capsule Take 50,000 Units by mouth every 7 (seven) days.       Patient Stressors:    Patient Strengths:    Treatment Modalities: Medication Management, Group therapy, Case management,  1 to 1 session with clinician, Psychoeducation, Recreational therapy.   Physician Treatment Plan for Primary Diagnosis: Schizophrenia, paranoid (HCC) Long Term Goal(s): Improvement in symptoms so as ready for discharge Improvement in symptoms so as ready for discharge   Short Term Goals: Ability to identify changes in lifestyle to reduce recurrence of condition will improve Ability to verbalize feelings will improve Ability to disclose and discuss suicidal ideas Ability to demonstrate self-control will improve Ability to identify and develop effective coping behaviors will improve Ability to  maintain clinical measurements within normal limits will improve Compliance with prescribed medications will improve Ability to identify triggers associated with substance abuse/mental health issues will improve Ability to identify changes in lifestyle to reduce recurrence of condition will improve Ability to demonstrate self-control will improve Ability to identify triggers associated with substance abuse/mental health issues will improve  Medication Management: Evaluate patient's response, side effects, and tolerance of medication regimen.  Therapeutic Interventions: 1 to 1 sessions, Unit Group sessions and Medication administration.  Evaluation of Outcomes: Adequate for Discharge  Physician Treatment Plan for Secondary Diagnosis: Principal Problem:   Schizophrenia, paranoid (HCC) Active Problems:   Seizures (HCC)   Cannabis use disorder, moderate, dependence (HCC)   Tobacco use disorder   Postictal psychosis  Long Term Goal(s): Improvement in symptoms so as ready for discharge Improvement in symptoms so as ready for discharge   Short Term Goals: Ability to identify changes in lifestyle to reduce recurrence of condition will improve Ability to verbalize feelings will improve Ability to disclose and discuss suicidal ideas Ability to demonstrate self-control will improve Ability to identify and develop effective coping behaviors will improve Ability to maintain clinical measurements within normal limits will improve Compliance with prescribed medications will improve Ability to identify triggers associated with substance abuse/mental health issues will improve Ability to identify changes in lifestyle to reduce recurrence of condition will improve Ability to demonstrate self-control will improve Ability to identify triggers associated with substance abuse/mental health issues will improve     Medication Management: Evaluate patient's response, side effects, and tolerance of  medication regimen.  Therapeutic Interventions: 1 to 1 sessions, Unit Group sessions and Medication administration.  Evaluation of Outcomes: Adequate for Discharge   RN Treatment Plan for Primary Diagnosis: Schizophrenia, paranoid (HCC) Long Term Goal(s): Knowledge of disease and therapeutic regimen to maintain health will improve  Short Term Goals: Ability to participate in decision making will improve, Ability to identify and develop effective coping behaviors will improve and Compliance with prescribed medications will improve  Medication Management: RN will administer medications as ordered by provider, will assess and evaluate patient's response and provide education to patient for prescribed medication. RN will report any adverse and/or side effects to prescribing provider.  Therapeutic Interventions: 1 on 1 counseling sessions, Psychoeducation, Medication administration, Evaluate responses to treatment, Monitor vital signs and CBGs as ordered, Perform/monitor CIWA, COWS, AIMS and Fall Risk screenings as ordered, Perform wound care treatments as ordered.  Evaluation of Outcomes: Adequate for Discharge   LCSW Treatment Plan for Primary Diagnosis: Schizophrenia, paranoid (HCC) Long Term Goal(s): Safe transition to appropriate next level of care at discharge, Engage patient in therapeutic group addressing interpersonal concerns.  Short Term Goals: Engage patient in aftercare planning with referrals and resources, Increase emotional regulation and Increase skills  for wellness and recovery  Therapeutic Interventions: Assess for all discharge needs, 1 to 1 time with Social worker, Explore available resources and support systems, Assess for adequacy in community support network, Educate family and significant other(s) on suicide prevention, Complete Psychosocial Assessment, Interpersonal group therapy.  Evaluation of Outcomes: Adequate for Discharge   Progress in Treatment: Attending  groups: Yes. Participating in groups: Yes. Taking medication as prescribed: Yes. Toleration medication: Yes. Family/Significant other contact made: Yes, individual(s) contacted:  with pt's guardian. Patient understands diagnosis: Yes. Discussing patient identified problems/goals with staff: Yes. Medical problems stabilized or resolved: Yes. Denies suicidal/homicidal ideation: Yes. Issues/concerns per patient self-inventory: No. Other: None at this time.   New problem(s) identified: No, Describe:  none at this time.  New Short Term/Long Term Goal(s): Pt reported his goal is to, "go home and to see my daughter more."    Discharge Plan or Barriers: Pt will be discharged home and will continue tx with [facility].   Reason for Continuation of Hospitalization: Medication stabilization  Estimated Length of Stay: 1 day   Recreational Therapy: Patient Stressors: Family, Friends(People doing stupid stuff.) Patient Goal: Patient will identify 3 positive coping skills strategies to use post d/c within 5 recreation therapy group sessions.  Attendees: Patient: Daniel Craig  02/28/2018 11:19 AM  Physician: Dr. Jennet Maduro, MD 02/28/2018 11:19 AM  Nursing: Leonia Reader, RN  02/28/2018 11:19 AM  RN Care Manager: 02/28/2018 11:19 AM  Social Worker: Heidi Dach, LCSW 02/28/2018 11:19 AM  Recreational Therapist: Garret Reddish, CTRS-LRT 02/28/2018 11:19 AM  Other: Johny Shears, LCSWA 02/28/2018 11:19 AM  Other: Matilde Bash, LCSW  02/28/2018 11:19 AM  Other: 02/28/2018 11:19 AM    Scribe for Treatment Team: Heidi Dach, LCSW 02/28/2018 11:19 AM

## 2018-02-28 NOTE — Progress Notes (Signed)
Denies SI/HI/AVH.  Bright affect.  Laughing and joking. Discharge instructions given, verbalized understanding.  Personal belongings returned.  Escorted off unit by this Clinical research associatewriter to meat act team member to travel home.

## 2018-02-28 NOTE — Discharge Summary (Signed)
Physician Discharge Summary Note  Patient:  Daniel Craig is an 27 y.o., male MRN:  [id number] DOB:  11-24-1991 Patient phone:  [phone] (home)  Patient address:   [address] [place] [address],  Total Time spent with patient: 1 hour  Date of Admission:  02/27/2018 Date of Discharge: 02/28/2018  Reason for Admission:  Psychosis  History of Present Illness:   Identifying data. Mr. Daniel Craig is a 27 year old male with a history of seizure disorder and schizophrenia.  Chief complaint. "I am fine."  History of present illness. Information was obtained from the patient and the chart. The patient came to the ER psychotic predicting the "the world will come to an end at midnight and that there will be fire". He was disorganized in his thinking and was admitted to psychiatry. He does have a diagnosis of schizophrenia and has been compliant with his medications of Zyprexa and Risperdal prescribed by his primary psychiatrist. The patient also has a history of seizures. Repertedly, on the day of admission he was smoking cannabis at a friend's house and had an episode of shaking. He then became strange and made delusional statements. His father who is also his guardian informed us that the patient has postictal psychotic episodes. He uses cannabis but no alcohol or other drugs.  At the time of my evaluation, the patient is no longer psychotic. He denies symptoms of depression, anxiety or bipolar mania. He is pleasanr polite and cooperative. He has good sense of humor. He is sad that, again, he has not been seizure free for 6 months to get his drivers license.  Past psychiatric history. Diagnosed with schizophrenia or schizoaffective disorder. He has been tried on multi[ple medications. Zyprexa and Risperdal combination work well. Denies suicide attempts. Follows up with [facility]. He [facility] Insurance [facility]rolog[facility].   Family psychiatric history. None reported.  Social history. He is incompetent  adult and his father is the guardian. He used to live in a group home but few months ago he moved to independent apartment. He goes to day program and has support from [name] peer support team.  Principal Problem: Schizophrenia, paranoid [facility](HCC) Discharge Diagnoses: Patient Active Problem List   Diagnosis Date Noted  . Schizophrenia, paranoid (HCC) [F20.0] 02/27/2018    Priority: High  . Cannabis use disorder, moderate, dependence (HCC) [F12.20] 02/27/2018  . Tobacco use disorder [F17.200] 02/27/2018  . 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 Altered mental status [R41.82] 12/28/2017  . Fever [R50.9] 08/11/2017  . Acute URI [J06.9] 08/11/2017  . Delirium [R41.0] 06/25/2017  . Schizophrenia (HCC) [F20.9] 06/25/2017  . Seizures (HCC) [R56.9] 06/25/2017    Past Medical History:  Past Medical History:  Diagnosis Date  . Schizophrenia (HCC)   . Seizures (HCC)     Past Surgical History:  Procedure Laterality Date  . MOUTH SURGERY     Family History:  Family History  Family history unknown: Yes    Social History:  Social History   Substance and Sexual Activity  Alcohol Use Yes  . Alcohol/week: 2.4 oz  . Types: 4 Shots of liquor per week   Comment: pt states 4 shots of liqour oer week     Social History   Substance and Sexual Activity  Drug Use Yes  . Types: Marijuana    Social History   Socioeconomic History  . Marital status: Single    Spouse name: Not on file  . Number of children: Not on file  . Years of  education: Not on file  . Highest education level: Not on file  Occupational History  . Not on file  Social Needs  . Financial resource strain: Not on file  . Food insecurity:    Worry: Not on file    Inability: Not on file  . Transportation needs:    Medical: Not on file    Non-medical: Not on file  Tobacco Use  . Smoking status: Current Every Day Smoker    Packs/day: 1.00    Types: Cigarettes  . Smokeless tobacco: Never Used  Substance  and Sexual Activity  . Alcohol use: Yes    Alcohol/week: 2.4 oz    Types: 4 Shots of liquor per week    Comment: pt states 4 shots of liqour oer week  . Drug use: Yes    Types: Marijuana  . Sexual activity: Not on file  Lifestyle  . Physical activity:    Days per week: Not on file    Minutes per session: Not on file  . Stress: Not on file  Relationships  . Social connections:    Talks on phone: Not on file    Gets together: Not on file    Attends religious service: Not on file    Active member of club or organization: Not on file    Attends meetings of clubs or organizations: Not on file    Relationship status: Not on file  Other Topics Concern  . Not on file  Social History Narrative  . Not on file    Hospital Course:    Mr. Daniel Craig is a 27 year old male with a history of psychosis and seizures admitted for a psychotic break, possibly postictal psychosis. The patient came to the ER after a "shaking" episode at a friend's house where he was smoking cannabis. According to his father, following seizure the patient remains psychotic and sometimes homicidal for a day or two.  #Psychosis, resolved -continue Risperdal 3 mg and Zyprexa 10 mg nightly as in the community  #Seizures, seizures on 02/27/2018 -continue Depakote 1500 mg daily and Lamictal 300 mg daily as in the community -VPA level 107 on 02/28/2018  #Insomnia -Trazodone 100 mg PRN  #HTN -Propranolol 60 mg daily  #Cannabis use -patient minimizes problem and declines treatment  #Metabolic syndrome monitoring -lipid panel and TSH are normal, HgbA1C pending -EKG, QTc 388  #Smoking cessation -nicotine patch was available  #Social -incompetent adult, father Manville Rico is the guardian  #Disposition -discharge back to his apartment -follow up with ACT team    Physical Findings: AIMS:  , ,  ,  ,    CIWA:    COWS:     Musculoskeletal: Strength & Muscle Tone: within normal limits Gait & Station:  normal Patient leans: N/A  Psychiatric Specialty Exam: Physical Exam  Nursing note and vitals reviewed. Psychiatric: He has a normal mood and affect. His speech is normal and behavior is normal. Thought content normal. Cognition and memory are normal. He expresses impulsivity.    Review of Systems  Neurological: Positive for seizures.  Psychiatric/Behavioral: Negative.   All other systems reviewed and are negative.   Blood pressure 110/75, pulse 87, temperature 98.1 F (36.7 C), temperature source Oral, resp. rate 19, height 6' (1.829 m), weight 98 kg (216 lb), SpO2 100 %.Body mass index is 29.29 kg/m.  General Appearance: Casual  Eye Contact:  Good  Speech:  Clear and Coherent  Volume:  Normal  Mood:  Euthymic  Affect:  Appropriate  Thought  Process:  Goal Directed and Descriptions of Associations: Intact  Orientation:  Full (Time, Place, and Person)  Thought Content:  WDL  Suicidal Thoughts:  No  Homicidal Thoughts:  No  Memory:  Immediate;   Fair Recent;   Fair Remote;   Fair  Judgement:  Fair  Insight:  Present  Psychomotor Activity:  Normal  Concentration:  Concentration: Fair and Attention Span: Fair  Recall:  [employer] of Knowledge:  Fair  Language:  Fair  Akathisia:  No  Handed:  Right  AIMS (if indicated):     Assets:  Communication Skills Desire for Improvement Financial Resources/Insurance Housing Resilience Social Support  ADL's:  Intact  Cognition:  WNL  Sleep:  Number of Hours: 7     Have you used any form of tobacco in the last 30 days? (Cigarettes, Smokeless Tobacco, Cigars, and/or Pipes): Yes  Has this patient used any form of tobacco in the last 30 days? (Cigarettes, Smokeless Tobacco, Cigars, and/or Pipes) Yes, No  Blood Alcohol level:  Lab Results  Component Value Date   ETH <10 02/26/2018   ETH <10 12/28/2017    Metabolic Disorder Labs:  No results found for: HGBA1C, MPG No results found for: PROLACTIN Lab Results  Component Value  Date   CHOL 128 02/28/2018   TRIG 83 02/28/2018   HDL 47 02/28/2018   CHOLHDL 2.7 02/28/2018   VLDL 17 02/28/2018   LDLCALC 64 02/28/2018    See Psychiatric Specialty Exam and Suicide Risk Assessment completed by Attending Physician prior to discharge.  Discharge destination:  Home  Is patient on multiple antipsychotic therapies at discharge:  Yes,   Do you recommend tapering to monotherapy for antipsychotics?  No   Has Patient had three or more failed trials of antipsychotic monotherapy by history:  Yes,   Antipsychotic medications that previously failed include:   1.  zyprexa., 2.  risperdal. and 3.  seroquel.  Recommended Plan for Multiple Antipsychotic Therapies: Additional reason(s) for multiple antispychotic treatment:  inadequate response to a single agent  Discharge Instructions    Diet - low sodium heart healthy   Complete by:  As directed    Increase activity slowly   Complete by:  As directed      Allergies as of 02/28/2018      Reactions   Keppra [levetiracetam]    Trileptal [oxcarbazepine]    Vimpat [lacosamide]       Medication List    STOP taking these medications   acetaminophen 325 MG tablet Commonly known as:  TYLENOL   ibuprofen 400 MG tablet Commonly known as:  ADVIL,MOTRIN     TAKE these medications     Indication  divalproex 250 MG DR tablet Commonly known as:  DEPAKOTE Take 1 tablet (250 mg total) by mouth 2 (two) times daily. 250 mg in the morning and 500 mg at bedtime What changed:    how much to take  additional instructions  Indication:  Complex Partial Epilepsy   lamoTRIgine 100 MG tablet Commonly known as:  LAMICTAL Take 100-200 mg by mouth daily.  Indication:  Tonic-Clonic Seizures   LORazepam 1 MG tablet Commonly known as:  ATIVAN Take 1 tablet (1 mg total) by mouth 2 (two) times daily as needed for seizure (or shaking).  Indication:  Status Epilepticus   OLANZapine 5 MG tablet Commonly known as:  ZYPREXA Take 5 mg by  mouth at bedtime.  Indication:  Schizophrenia   propranolol 60 MG tablet Commonly known as:  INDERAL Take 60 mg by mouth daily.  Indication:  Schizophrenia   risperiDONE 3 MG tablet Commonly known as:  RISPERDAL Take 3 mg by mouth at bedtime.  Indication:  Schizophrenia   Vitamin D (Ergocalciferol) 50000 units Caps capsule Commonly known as:  DRISDOL Take 50,000 Units by mouth every 7 (seven) days.  Indication:  Vitamin D Deficiency      Follow-up Information    [employer] [employer] Go on 02/28/2018.   Why:  Your Office manager with [employer] will resume immediately upon discharge, when your peer support specialist Cooper Render picks you up.  Contact information: [address] [place] [phone]           Follow-up recommendations:  Activity:  as tolerated Diet:  regular Other:  keep follow up appointments  Comments:    Signed: Kristine Linea, MD 02/28/2018, 11:52 AM

## 2018-02-28 NOTE — [facility] Suicide Risk Assessment (Signed)
[facility] INPATIENT:  Family/Significant Other Suicide Prevention Education  Suicide Prevention Education:  Education Completed; Smiley HousemanRonnie Marmol, pt's father and guardian, at  [phone] has been identified by the patient as the family member/significant other with whom the patient will be residing, and identified as the person(s) who will aid the patient in the event of a mental health crisis (suicidal ideations/suicide attempt).  With written consent from the patient, the family member/significant other has been provided the following suicide prevention education, prior to the and/or following the discharge of the patient.  The suicide prevention education provided includes the following:  Suicide risk factors  Suicide prevention and interventions  National Suicide Hotline telephone number  [facility] assessment telephone number  [facility] Emergency Assistance 911  [facility] and/or Residential Mobile Crisis Unit telephone number  Request made of family/significant other to:  Remove weapons (e.g., guns, rifles, knives), all items previously/currently identified as safety concern.    Remove drugs/medications (over-the-counter, prescriptions, illicit drugs), all items previously/currently identified as a safety concern.  The family member/significant other verbalizes understanding of the suicide prevention education information provided.  The family member/significant other agrees to remove the items of safety concern listed above.  Heidi DachKelsey Azyah Flett, LCSW 02/28/2018, 9:29 AM

## 2018-02-28 NOTE — [facility] Suicide Risk Assessment (Signed)
[facility]   Principal Problem: Schizophrenia, paranoid [facility](HCC) Discharge Diagnoses:  Patient Active Problem List   Diagnosis Date Noted  . Schizophrenia, paranoid (HCC) [F20.0] 02/27/2018    Priority: High  . Cannabis use disorder, moderate, dependence (HCC) [F12.20] 02/27/2018  . Tobacco use disorder [F17.200] 02/27/2018  . 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 Altered mental status [R41.82] 12/28/2017  . Fever [R50.9] 08/11/2017  . Acute URI [J06.9] 08/11/2017  . Delirium [R41.0] 06/25/2017  . Schizophrenia (HCC) [F20.9] 06/25/2017  . Seizures (HCC) [R56.9] 06/25/2017    Total Time spent with patient: 1 hour  Musculoskeletal: Strength & Muscle Tone: within normal limits Gait & Station: normal Patient leans: N/A  Psychiatric Specialty Exam: Review of Systems  Neurological: Positive for seizures.  Psychiatric/Behavioral: Negative.   All other systems reviewed and are negative.   Blood pressure 110/75, pulse 87, temperature 98.1 F (36.7 C), temperature source Oral, resp. rate 19, height 6' (1.829 m), weight 98 kg (216 lb), SpO2 100 %.Body mass index is 29.29 kg/m.  General Appearance: Casual  Eye Contact::  Good  Speech:  Clear and Coherent409  Volume:  Normal  Mood:  Euthymic  Affect:  Appropriate  Thought Process:  Goal Directed and Descriptions of Associations: Intact  Orientation:  Full (Time, Place, and Person)  Thought Content:  WDL  Suicidal Thoughts:  No  Homicidal Thoughts:  No  Memory:  Immediate;   Fair Recent;   Fair Remote;   Fair  Judgement:  Impaired  Insight:  Present  Psychomotor Activity:  Normal  Concentration:  Fair  Recall:  [employer]  Fund of Knowledge:Fair  Language: Fair  Akathisia:  No  Handed:  Right  AIMS (if indicated):     Assets:  Communication Skills Desire for Improvement Financial Resources/Insurance Housing Resilience Social Support  Sleep:  Number of Hours: 7  Cognition: WNL  ADL's:  Intact    Mental Status Per Nursing Assessment::   On Admission:  NA  Demographic Factors:  Male and Living alone  Loss Factors: Decline in physical health  Historical Factors: Impulsivity  Risk Reduction Factors:   Sense of responsibility to family, Positive social support and Positive therapeutic relationship  Continued Clinical Symptoms:  Alcohol/Substance Abuse/Dependencies Schizophrenia:   Less than 27 years old Paranoid or undifferentiated type Epilepsy  Cognitive Features That Contribute To Risk:  None    Suicide Risk:  Minimal: No identifiable suicidal ideation.  Patients presenting with no risk factors but with morbid ruminations; may be classified as minimal risk based on the severity of the depressive symptoms  Follow-up Information    [facility] Go on 02/28/2018.   Why:  Your Office managereer Support Services with [employer] will resume immediately upon discharge, when your peer support specialist Cooper RenderBobbie Foster picks you up.  Contact information: [address] [place] [address] [phone]           Plan Of Care/Follow-up recommendations:  Activity:  as tolerated Diet:  low sodium heart healthy Other:  keep follow up appointments  Kristine LineaJolanta Alyson Ki, MD 02/28/2018, 11:50 AM

## 2018-03-21 LAB — HIV ANTIBODY (ROUTINE TESTING W REFLEX): HIV SCREEN 4TH GENERATION: NONREACTIVE

## 2018-03-24 ENCOUNTER — Encounter: Payer: Self-pay | Admitting: Emergency Medicine

## 2018-03-24 ENCOUNTER — Other Ambulatory Visit: Payer: Self-pay

## 2018-03-24 ENCOUNTER — Emergency Department
Admission: EM | Admit: 2018-03-24 | Discharge: 2018-03-25 | Disposition: A | Discharge status: Home / Self Care | Payer: Medicaid Other | Attending: Emergency Medicine | Admitting: Emergency Medicine

## 2018-03-24 DIAGNOSIS — F22 Delusional disorders: Secondary | ICD-10-CM

## 2018-03-24 DIAGNOSIS — F17210 Nicotine dependence, cigarettes, uncomplicated: Secondary | ICD-10-CM | POA: Insufficient documentation

## 2018-03-24 DIAGNOSIS — Z79899 Other long term (current) drug therapy: Secondary | ICD-10-CM | POA: Insufficient documentation

## 2018-03-24 DIAGNOSIS — F1220 Cannabis dependence, uncomplicated: Secondary | ICD-10-CM | POA: Diagnosis present

## 2018-03-24 DIAGNOSIS — F200 Paranoid schizophrenia: Secondary | ICD-10-CM | POA: Diagnosis not present

## 2018-03-24 DIAGNOSIS — R440 Auditory hallucinations: Secondary | ICD-10-CM

## 2018-03-24 DIAGNOSIS — R569 Unspecified convulsions: Secondary | ICD-10-CM

## 2018-03-24 LAB — COMPREHENSIVE METABOLIC PANEL
ALBUMIN: 4.2 g/dL (ref 3.5–5.0)
ALT: 29 U/L (ref 17–63)
ANION GAP: 14 (ref 5–15)
AST: 42 U/L — ABNORMAL HIGH (ref 15–41)
Alkaline Phosphatase: 34 U/L — ABNORMAL LOW (ref 38–126)
BILIRUBIN TOTAL: 0.8 mg/dL (ref 0.3–1.2)
BUN: 12 mg/dL (ref 6–20)
CO2: 21 mmol/L — ABNORMAL LOW (ref 22–32)
Calcium: 9.7 mg/dL (ref 8.9–10.3)
Chloride: 100 mmol/L — ABNORMAL LOW (ref 101–111)
Creatinine, Ser: 1 mg/dL (ref 0.61–1.24)
GFR calc Af Amer: >60 mL/min (ref >60)
Glucose, Bld: 95 mg/dL (ref 65–99)
POTASSIUM: 3.7 mmol/L (ref 3.5–5.1)
Sodium: 135 mmol/L (ref 135–145)
TOTAL PROTEIN: 7.8 g/dL (ref 6.5–8.1)

## 2018-03-24 LAB — CBC
HCT: 41.8 % (ref 40.0–52.0)
Hemoglobin: 14.7 g/dL (ref 13.0–18.0)
MCH: 32.2 pg (ref 26.0–34.0)
MCHC: 35.2 g/dL (ref 32.0–36.0)
MCV: 91.6 fL (ref 80.0–100.0)
PLATELETS: 164 10*3/uL (ref 150–440)
RBC: 4.56 MIL/uL (ref 4.40–5.90)
RDW: 13.1 % (ref 11.5–14.5)
WBC: 6.9 10*3/uL (ref 3.8–10.6)

## 2018-03-24 LAB — ACETAMINOPHEN LEVEL

## 2018-03-24 LAB — ETHANOL

## 2018-03-24 LAB — SALICYLATE LEVEL: Salicylate Lvl: <7 mg/dL (ref 2.8–30.0)

## 2018-03-24 NOTE — ED Triage Notes (Signed)
EMS pt to Rm 20 from home. EMS called by patient because he has been hearing voices and feels like someone is following him. Pt with hx of paranoid Schizophrenia.

## 2018-03-25 DIAGNOSIS — F200 Paranoid schizophrenia: Secondary | ICD-10-CM

## 2018-03-25 NOTE — ED Provider Notes (Signed)
[facility] Emergency Department Provider Note   ____________________________________________   First MD Initiated Contact with Patient 03/24/18 2308     (approximate)  I have reviewed the triage vital signs and the nursing notes.   HISTORY  Chief Complaint Paranoid    HPI Daniel Craig is a 27 y.o. male with a history of schizophrenia who comes into the hospital today with auditory hallucinations.  The patient lives in his own apartment and has been hearing voices telling him to kill other people.  The patient was recently admitted to the hospital for inpatient psych and has been taking his medications.  The patient also feels as though someone is following him.  When I did go into see the patient he was asleep and would not fully arouse.  He states that he is awake and then falls back asleep during the assessment.   Past Medical History:  Diagnosis Date  . Schizophrenia (HCC)   . Seizures [facility])     Patient Active Problem List   Diagnosis Date Noted  . Schizophrenia, paranoid (HCC) 02/27/2018  . Cannabis use disorder, moderate, dependence (HCC) 02/27/2018  . Tobacco use disorder 02/27/2018  . 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 Altered mental status 12/28/2017  . Fever 08/11/2017  . Acute URI 08/11/2017  . Delirium 06/25/2017  . Schizophrenia (HCC) 06/25/2017  . Seizures (HCC) 06/25/2017    Past Surgical History:  Procedure Laterality Date  . MOUTH SURGERY      Prior to Admission medications   Medication Sig Start Date End Date Taking? Authorizing Provider  divalproex (DEPAKOTE ER) 500 MG 24 hr tablet Take 500-1,000 mg by mouth 2 (two) times daily. Take 1 tablet in the morning and 2 tablets at bedtime.   Yes [provider]  lamoTRIgine (LAMICTAL) 100 MG tablet Take 100-200 mg by mouth daily.    Yes [provider]  OLANZapine (ZYPREXA) 5 MG tablet Take 5 mg by mouth at bedtime.    Yes [provider]    propranolol (INDERAL) 60 MG tablet Take 60 mg by mouth daily.   Yes [provider]  risperiDONE (RISPERDAL) 3 MG tablet Take 3 mg by mouth at bedtime.   Yes [provider]  Vitamin D, Ergocalciferol, (DRISDOL) 50000 units CAPS capsule Take 50,000 Units by mouth every 7 (seven) days.   Yes [provider]  LORazepam (ATIVAN) 1 MG tablet Take 1 tablet (1 mg total) by mouth 2 (two) times daily as needed for seizure (or shaking). Patient not taking: Reported on 02/27/2018 01/06/17   Jennye Moccasin, MD    Allergies Keppra [levetiracetam]; Trileptal [oxcarbazepine]; and Vimpat [lacosamide]  Family History  Family history unknown: Yes    Social History Social History   Tobacco Use  . Smoking status: Current Every Day Smoker    Packs/day: 1.00    Types: Cigarettes  . Smokeless tobacco: Never Used  Substance Use Topics  . Alcohol use: Yes    Alcohol/week: 2.4 oz    Types: 4 Shots of liquor per week    Comment: pt states 4 shots of liqour oer week  . Drug use: Yes    Types: Marijuana    Review of Systems  Constitutional: No fever/chills Eyes: No visual changes. ENT: No sore throat. Cardiovascular: Denies chest pain. Respiratory: Denies shortness of breath. Gastrointestinal: No abdominal pain.  No nausea, no vomiting.  No diarrhea.  No constipation. Genitourinary: Negative for dysuria. Musculoskeletal: Negative for back pain. Skin: Negative for  rash. Neurological: Negative for headaches, focal weakness or numbness. Psych: auditory hallucinations  ____________________________________________   PHYSICAL EXAM:  VITAL SIGNS: ED Triage Vitals  Enc Vitals Group     BP 03/24/18 2301 137/77     Pulse Rate 03/24/18 2301 86     Resp 03/24/18 2301 18     Temp 03/24/18 2301 98 F (36.7 C)     Temp Source 03/24/18 2301 Oral     SpO2 03/24/18 2301 95 %     Weight 03/24/18 2302 230 lb (104.3 kg)     Height 03/24/18 2302 6' (1.829 m)     Head  Circumference --      Peak Flow --      Pain Score 03/24/18 2302 0     Pain Loc --      Pain Edu? --      Excl. in GC? --     Constitutional: Sleeping and arousable but falls immediately back to sleep Eyes: Conjunctivae are normal. PERRL. EOMI. Head: Atraumatic. Nose: No congestion/rhinnorhea. Mouth/Throat: Mucous membranes are moist.  Oropharynx non-erythematous. Cardiovascular: Normal rate, regular rhythm. Grossly normal heart sounds.  Good peripheral circulation. Respiratory: Normal respiratory effort.  No retractions. Lungs CTAB. Gastrointestinal: Soft and nontender. No distention.  Musculoskeletal: No lower extremity tenderness nor edema.   Neurologic:  Normal speech and language. Skin:  Skin is warm, dry and intact.  Psychiatric: Mood and affect are normal.   ____________________________________________   LABS (all labs ordered are listed, but only abnormal results are displayed)  Labs Reviewed  COMPREHENSIVE METABOLIC PANEL - Abnormal; Notable for the following components:      Result Value   Chloride 100 (*)    CO2 21 (*)    AST 42 (*)    Alkaline Phosphatase 34 (*)    All other components within normal limits  ACETAMINOPHEN LEVEL - Abnormal; Notable for the following components:   Acetaminophen (Tylenol), Serum <10 (*)    All other components within normal limits  ETHANOL  SALICYLATE LEVEL  CBC  URINE DRUG SCREEN, QUALITATIVE ([facility] ONLY)   ____________________________________________  EKG  none ____________________________________________  RADIOLOGY  ED MD interpretation:  none  Official radiology report(s): No results found.  ____________________________________________   PROCEDURES  Procedure(s) performed: None  Procedures  Critical Care performed: No  ____________________________________________   INITIAL IMPRESSION / ASSESSMENT AND PLAN / ED COURSE  As part of my medical decision making, I reviewed the following data within the  electronic MEDICAL RECORD NUMBER Notes from prior ED visits and [place] Controlled Substance Database   This is a 27 year old male with a history of schizophrenia who comes into the hospital today with some paranoia and auditory hallucinations.  While the patient is not suicidal his voices are telling him that he should kill people.  The patient is not under involuntary commitment but we will have him evaluated by TTS and by psychiatry.  The patient had a CBC CMP ethanol salicylate and acetaminophen levels performed that were all unremarkable.      ____________________________________________   FINAL CLINICAL IMPRESSION(S) / ED DIAGNOSES  Final diagnoses:  Auditory hallucinations  Paranoia [facility])     ED Discharge Orders    None       Note:  This document was prepared using Dragon voice recognition software and may include unintentional dictation errors.    Rebecka Apley, MD 03/25/18 [phone]

## 2018-03-25 NOTE — ED Notes (Signed)

## 2018-03-25 NOTE — BH Assessment (Signed)
Assessment Note  Daniel Craig is an 27 y.o. male who presents to the ED following an episode of paranoia wherein he thought someone was following him. He reports that yesterday he was walking around in his apartment complex parking lot, when he had the distinct feeling that someone was following him. He reports not seeing anyone physically around him. He states, "I was walking around in the parking lot and I thought someone was following me so I went home and called my homeboy. He told me to just drink some water and sit down. I felt better when I drank the water but it still seemed like someone was at my door so I called the police." He admits to smoking marijuana earlier but denies that it had any contribution to his paranoid state. He states, "Oh yeah I smoked that afternoon but I know that wasn't it cause it started right after I took my meds and I always take my meds." He also reports that he had not been to sleep the night before and was going on 24 hours of sleep deprivation. He also reports that he hasn't had much of an appetite lately. He also reports hearing a male voice telling him "if you see him stab him" (referring to the "person" following him.  Although the pt lives alone in his own apartment, he does have a guardian Film/video editor). During the assessment, he was calm, cooperative, and pleasant to work with. He answered all questions appropriately and was oriented x4. He was recently discharged from [facility] and receives outpatient services at [facility] in [place] with Claudine Mouton, PMHNP-BC. He denies SI and visual hallucinations but admits to feeling homicidal when he thought he heard a voice telling him to "stab him".   Diagnosis: Schizophrenia   Past Medical History:  Past Medical History:  Diagnosis Date  . Schizophrenia (HCC)   . Seizures (HCC)     Past Surgical History:  Procedure Laterality Date  . MOUTH SURGERY      Family History:   Family History  Family history unknown: Yes    Social History:  reports that he has been smoking cigarettes.  He has been smoking about 1.00 pack per day. He has never used smokeless tobacco. He reports that he drinks about 2.4 oz of alcohol per week. He reports that he has current or past drug history. Drug: Marijuana.  Additional Social History:  Alcohol / Drug Use Pain Medications: See MAR Prescriptions: See MAR Over the Counter: See MAR History of alcohol / drug use?: Yes Substance #1 Name of Substance 1: Marijuana 1 - Age of First Use: 12 1 - Frequency: daily 1 - Last Use / Amount: 03/24/18  CIWA: CIWA-Ar BP: 137/77 Pulse Rate: 86 COWS:    Allergies:  Allergies  Allergen Reactions  . Keppra [Levetiracetam]   . Trileptal [Oxcarbazepine]   . Vimpat [Lacosamide]     Home Medications:  (Not in a hospital admission)  OB/GYN Status:  No LMP for male patient.  General Assessment Data Location of Assessment: [facility] ED TTS Assessment: In system Is this a Tele or Face-to-Face Assessment?: Face-to-Face Is this an Initial Assessment or a Re-assessment for this encounter?: Initial Assessment Marital status: Single Is patient pregnant?: No Pregnancy Status: No Living Arrangements: Alone Can pt return to current living arrangement?: Yes Admission Status: Voluntary Is patient capable of signing voluntary admission?: Yes Referral Source: Self/Family/Friend Insurance type: Medicaid  Medical Screening Exam [facility] Walk-in ONLY) Medical Exam completed: Yes  Crisis Care Plan Living Arrangements: Alone Legal Guardian: Father Name of Psychiatrist: Claudine Mouton  Education Status Is patient currently in school?: No Is the patient employed, unemployed or receiving disability?: Receiving disability income  Risk to self with the past 6 months Suicidal Ideation: No Has patient been a risk to self within the past 6 months prior to admission? : No Suicidal Intent: No Has  patient had any suicidal intent within the past 6 months prior to admission? : No Is patient at risk for suicide?: No Suicidal Plan?: No Has patient had any suicidal plan within the past 6 months prior to admission? : No Access to Means: No What has been your use of drugs/alcohol within the last 12 months?: reorts daily marijuana use Previous Attempts/Gestures: No How many times?: 0 Other Self Harm Risks: none reported Triggers for Past Attempts: None known Intentional Self Injurious Behavior: None Family Suicide History: No Recent stressful life event(s): Other (Comment) Persecutory voices/beliefs?: Yes Depression: No Substance abuse history and/or treatment for substance abuse?: Yes Suicide prevention information given to non-admitted patients: Not applicable  Risk to Others within the past 6 months Homicidal Ideation: No-Not Currently/Within Last 6 Months Does patient have any lifetime risk of violence toward others beyond the six months prior to admission? : No Thoughts of Harm to Others: No Current Homicidal Intent: No Current Homicidal Plan: No Access to Homicidal Means: No Identified Victim: n/a History of harm to others?: No Assessment of Violence: None Noted Violent Behavior Description: none reported Does patient have access to weapons?: No Criminal Charges Pending?: No Does patient have a court date: No Is patient on probation?: No  Psychosis Hallucinations: Auditory, With command Delusions: Persecutory  Mental Status Report Appearance/Hygiene: In scrubs Eye Contact: Good Motor Activity: Freedom of movement Speech: Logical/coherent Level of Consciousness: Alert Mood: Pleasant Affect: Appropriate to circumstance Anxiety Level: None Thought Processes: Coherent, Relevant Judgement: Unimpaired Orientation: Place, Person, Time, Situation, Appropriate for developmental age Obsessive Compulsive Thoughts/Behaviors: None  Cognitive Functioning Concentration:  Normal Memory: Remote Intact, Recent Intact Is patient IDD: No Is patient DD?: No Insight: Good Impulse Control: Fair Appetite: Poor Have you had any weight changes? : Loss Amount of the weight change? (lbs): 5 lbs Sleep: Decreased Total Hours of Sleep: 3 Vegetative Symptoms: None  ADLScreening [facility] Assessment Services) Patient's cognitive ability adequate to safely complete daily activities?: Yes Patient able to express need for assistance with ADLs?: Yes Independently performs ADLs?: Yes (appropriate for developmental age)  Prior Inpatient Therapy Prior Inpatient Therapy: Yes Prior Therapy Dates: past [facility]Provider[facility] Reason for Treatment: Schizophrenia   Prior Outpatient Therapy Prior Outpatient Therapy: Yes Prior Therapy Dates: current Prior Therapy Facilty/Provider(s): [place] Reason for Treatment: Schizophrenia Does patient have an ACCT team?: No Does patient have Intensive In-House Services?  : No Does patient have [facility] services? : No Does patient have P4CC services?: No  ADL Screening (condition at time of admission) Patient's cognitive ability adequate to safely complete daily activities?: Yes Is the patient deaf or have difficulty hearing?: No Does the patient have difficulty seeing, even when wearing glasses/contacts?: No Does the patient have difficulty concentrating, remembering, or making decisions?: No Patient able to express need for assistance with ADLs?: Yes Does the patient have difficulty dressing or bathing?: No Independently performs ADLs?: Yes (appropriate for developmental age) Does the patient have difficulty walking or climbing stairs?: No Weakness of Legs: None Weakness of Arms/Hands: None  Home Assistive Devices/Equipment Home Assistive  Devices/Equipment: None  Therapy Consults (therapy consults require a physician order) PT Evaluation Needed: No OT  Evalulation Needed: No SLP Evaluation Needed: No            Additional Information 1:1 In Past 12 Months?: No CIRT Risk: No Elopement Risk: No Does patient have medical clearance?: Yes  Child/Adolescent Assessment Running Away Risk: (PT IS AN ADULT)  Disposition:  Disposition Initial Assessment Completed for this Encounter: Yes Disposition of Patient: Discharge Patient refused recommended treatment: No Mode of transportation if patient is discharged?: Car  On Site Evaluation by:   Reviewed with Physician:    Tekoa Amon D Aundra Espin 03/25/2018 1:06 PM

## 2018-03-25 NOTE — ED Notes (Signed)
BEHAVIORAL HEALTH ROUNDING Patient sleeping: Yes.   Patient alert and oriented: not applicable SLEEPING Behavior appropriate: Yes.  ; If no, describe: SLEEPING Nutrition and fluids offered: No SLEEPING Toileting and hygiene offered: NoSLEEPING Sitter present: not applicable, Q 15 min safety rounds and observation. Law enforcement present: Yes ODS 

## 2018-03-25 NOTE — ED Notes (Signed)

## 2018-03-25 NOTE — ED Notes (Signed)
Patient observed lying in bed with eyes closed  Even, unlabored respirations observed   NAD pt appears to be sleeping  I will continue to monitor along with every 15 minute visual observations and ongoing security monitoring    

## 2018-03-25 NOTE — ED Notes (Signed)
BEHAVIORAL HEALTH ROUNDING Patient sleeping: Yes.   Patient alert and oriented: eyes closed  Appears to be asleep Behavior appropriate: Yes.  ; If no, describe:  Nutrition and fluids offered: Yes  Toileting and hygiene offered: sleeping Sitter present: q 15 minute observations and security monitoring Law enforcement present: yes   

## 2018-03-25 NOTE — ED Notes (Signed)
TTS was unable to complete the assessment, as patient could not be aroused enough to participate with the assessor

## 2018-03-25 NOTE — Consult Note (Signed)
[facility] Consult   Reason for Consult: Seen chart reviewed.  Consult for 27 year old man with a history of schizophrenia Referring Physician: Corky Downs Patient Identification: Brenner Visconti MRN:  [id number] Principal Diagnosis: Schizophrenia, paranoid [facility]) Diagnosis:   Patient Active Problem List   Diagnosis Date Noted  . Schizophrenia, paranoid ([place]) [F20.0] 02/27/2018  . Cannabis use disorder, moderate, dependence ([place]) [F12.20] 02/27/2018  . Tobacco use disorder [F17.200] 02/27/2018  . 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 Altered mental status [R41.82] 12/28/2017  . Fever [R50.9] 08/11/2017  . Acute URI [J06.9] 08/11/2017  . Delirium [R41.0] 06/25/2017  . Schizophrenia ([place]) [F20.9] 06/25/2017  . Seizures ([place]) [R56.9] 06/25/2017    Total Time spent with patient: 1 hour  Subjective:   Daniel Craig is a 27 y.o. male patient admitted with "I just felt paranoid" patient seen chart reviewed.  This 27 year old man with a history of schizophrenia came to the emergency room last night voluntarily.  He said he was walking around outside his apartment when he suddenly got the feeling that there was someone behind him who is going to stab him.  He paced around the parking lot for a while and then went inside where he could not fall asleep.  The patient has.  HPI: An alarm button that he can push if he needs to have immediate medical assistance and so he pushed on it.  He has no memory of any kind of seizure happening.  Patient says he has been compliant with his medicine.  He admits that he smoked some weed recently but denies any other drugs.  Patient denies suicidal or homicidal thoughts.  This morning he says he is feeling much better he no longer has any paranoid thoughts he is not hearing things and he feels back to baseline.  Social history: Patient lives in an independent apartment.  He has frequent contact with his outpatient mental health provider in  [place].  He is on medication management for both seizures and schizophrenia.  Medical history: History of seizure disorder  Substance abuse history: Abuses marijuana still  Past Psychiatric History: Patient has had several previous visits to the emergency room and has an established pattern of developing transient psychotic symptoms which will resolve spontaneously usually after a few hours.  No history of actual dangerous behavior no history of self injury.  He is generally compliant with his medicine.  Risk to Self: Suicidal Ideation: No Suicidal Intent: No Is patient at risk for suicide?: No Suicidal Plan?: No Access to Means: No What has been your use of drugs/alcohol within the last 12 months?: reorts daily marijuana use How many times?: 0 Other Self Harm Risks: none reported Triggers for Past Attempts: None known Intentional Self Injurious Behavior: None Risk to Others: Homicidal Ideation: No-Not Currently/Within Last 6 Months Thoughts of Harm to Others: No Current Homicidal Intent: No Current Homicidal Plan: No Access to Homicidal Means: No Identified Victim: n/a History of harm to others?: No Assessment of Violence: None Noted Violent Behavior Description: none reported Does patient have access to weapons?: No Criminal Charges Pending?: No Does patient have a court date: No Prior Inpatient Therapy: Prior Inpatient Therapy: Yes Prior Therapy Dates: past Prior Therapy Facilty/Provider(s): [place], [facility] Reason for Treatment: Schizophrenia  Prior Outpatient Therapy: Prior Outpatient Therapy: Yes Prior Therapy Dates: current Prior Therapy Facilty/Provider(s): [place] Reason for Treatment: Schizophrenia Does patient have an ACCT team?: No Does patient have Intensive In-House Services?  : No Does  patient have [facility] services? : No Does patient have P4CC services?: No  Past Medical History:  Past Medical History:   Diagnosis Date  . Schizophrenia ([place])   . Seizures ([place])     Past Surgical History:  Procedure Laterality Date  . MOUTH SURGERY     Family History:  Family History  Family history unknown: Yes   Family Psychiatric  History: None Social History:  Social History   Substance and Sexual Activity  Alcohol Use Yes  . Alcohol/week: 2.4 oz  . Types: 4 Shots of liquor per week   Comment: pt states 4 shots of liqour oer week     Social History   Substance and Sexual Activity  Drug Use Yes  . Types: Marijuana    Social History   Socioeconomic History  . Marital status: Single    Spouse name: Not on file  . Number of children: Not on file  . Years of education: Not on file  . Highest education level: Not on file  Occupational History  . Not on file  Social Needs  . Financial resource strain: Not on file  . Food insecurity:    Worry: Not on file    Inability: Not on file  . Transportation needs:    Medical: Not on file    Non-medical: Not on file  Tobacco Use  . Smoking status: Current Every Day Smoker    Packs/day: 1.00    Types: Cigarettes  . Smokeless tobacco: Never Used  Substance and Sexual Activity  . Alcohol use: Yes    Alcohol/week: 2.4 oz    Types: 4 Shots of liquor per week    Comment: pt states 4 shots of liqour oer week  . Drug use: Yes    Types: Marijuana  . Sexual activity: Not on file  Lifestyle  . Physical activity:    Days per week: Not on file    Minutes per session: Not on file  . Stress: Not on file  Relationships  . Social connections:    Talks on phone: Not on file    Gets together: Not on file    Attends religious service: Not on file    Active member of club or organization: Not on file    Attends meetings of clubs or organizations: Not on file    Relationship status: Not on file  Other Topics Concern  . Not on file  Social History Narrative  . Not on file   Additional Social History:    Allergies:   Allergies  Allergen  Reactions  . Keppra [Levetiracetam]   . Trileptal [Oxcarbazepine]   . Vimpat [Lacosamide]     Labs:  Results for orders placed or performed during the hospital encounter of 03/24/18 (from the past 48 hour(s))  Comprehensive metabolic panel     Status: Abnormal   Collection Time: 03/24/18 10:43 PM  Result Value Ref Range   Sodium 135 135 - 145 mmol/L   Potassium 3.7 3.5 - 5.1 mmol/L   Chloride 100 (L) 101 - 111 mmol/L   CO2 21 (L) 22 - 32 mmol/L   Glucose, Bld 95 65 - 99 mg/dL   BUN 12 6 - 20 mg/dL   Creatinine, Ser 1.00 0.61 - 1.24 mg/dL   Calcium 9.7 8.9 - 10.3 mg/dL   Total Protein 7.8 6.5 - 8.1 g/dL   Albumin 4.2 3.5 - 5.0 g/dL   AST 42 (H) 15 - 41 U/L   ALT 29 17 - 63  U/L   Alkaline Phosphatase 34 (L) 38 - 126 U/L   Total Bilirubin 0.8 0.3 - 1.2 mg/dL   GFR calc non Af Amer >60 >60 mL/min   GFR calc Af Amer >60 >60 mL/min    Comment: (NOTE) The eGFR has been calculated using the CKD EPI equation. This calculation has not been validated in all clinical situations. eGFR's persistently <60 mL/min signify possible Chronic Kidney Disease.    Anion gap 14 5 - 15    Comment: Performed at [name], [address] [place], [place] [address]  Ethanol     Status: None   Collection Time: 03/24/18 10:43 PM  Result Value Ref Range   Alcohol, Ethyl (B) <10 <10 mg/dL    Comment:        LOWEST DETECTABLE LIMIT FOR SERUM ALCOHOL IS 10 mg/dL FOR MEDICAL PURPOSES ONLY Performed at [facility], [address] [place], [place] [address]   Salicylate level     Status: None   Collection Time: 03/24/18 10:43 PM  Result Value Ref Range   Salicylate Lvl <5.7 2.8 - 30.0 mg/dL    Comment: Performed at [facility], [address] [place], [place] [address]  Acetaminophen level     Status: Abnormal   Collection Time: 03/24/18 10:43 PM  Result Value Ref Range   Acetaminophen (Tylenol), Serum <10 (L) 10 - 30 ug/mL    Comment:        THERAPEUTIC  CONCENTRATIONS VARY SIGNIFICANTLY. A RANGE OF 10-30 ug/mL MAY BE AN EFFECTIVE CONCENTRATION FOR MANY PATIENTS. HOWEVER, SOME ARE BEST TREATED AT CONCENTRATIONS OUTSIDE THIS RANGE. ACETAMINOPHEN CONCENTRATIONS >150 ug/mL AT 4 HOURS AFTER INGESTION AND >50 ug/mL AT 12 HOURS AFTER INGESTION ARE OFTEN ASSOCIATED WITH TOXIC REACTIONS. Performed at [facility], [address] [place], [place] [address]   cbc     Status: None   Collection Time: 03/24/18 10:43 PM  Result Value Ref Range   WBC 6.9 3.8 - 10.6 K/uL   RBC 4.56 4.40 - 5.90 MIL/uL   Hemoglobin 14.7 13.0 - 18.0 g/dL   HCT 41.8 40.0 - 52.0 %   MCV 91.6 80.0 - 100.0 fL   MCH 32.2 26.0 - 34.0 pg   MCHC 35.2 32.0 - 36.0 g/dL   RDW 13.1 11.5 - 14.5 %   Platelets 164 150 - 440 K/uL    Comment: Performed at [facility], [address] [place], [place] [address]    No current facility-administered medications for this encounter.    Current Outpatient Medications  Medication Sig Dispense Refill  . divalproex (DEPAKOTE ER) 500 MG 24 hr tablet Take 500-1,000 mg by mouth 2 (two) times daily. Take 1 tablet in the morning and 2 tablets at bedtime.    . lamoTRIgine (LAMICTAL) 100 MG tablet Take 100-200 mg by mouth daily.     Marland Kitchen OLANZapine (ZYPREXA) 5 MG tablet Take 5 mg by mouth at bedtime.     . propranolol (INDERAL) 60 MG tablet Take 60 mg by mouth daily.    . risperiDONE (RISPERDAL) 3 MG tablet Take 3 mg by mouth at bedtime.    . Vitamin D, Ergocalciferol, (DRISDOL) 50000 units CAPS capsule Take 50,000 Units by mouth every 7 (seven) days.    Marland Kitchen LORazepam (ATIVAN) 1 MG tablet Take 1 tablet (1 mg total) by mouth 2 (two) times daily as needed for seizure (or shaking). (Patient not taking: Reported on 02/27/2018) 10 tablet 0    Musculoskeletal:  Strength & Muscle Tone: within normal limits Gait & Station: normal Patient leans: N/A  Psychiatric Specialty Exam: Physical Exam  Nursing note and vitals  reviewed. Constitutional: He appears well-developed and well-nourished.  HENT:  Head: Normocephalic and atraumatic.  Eyes: Pupils are equal, round, and reactive to light. Conjunctivae are normal.  Neck: Normal range of motion.  Cardiovascular: Regular rhythm and normal heart sounds.  Respiratory: Effort normal. No respiratory distress.  GI: Soft.  Musculoskeletal: Normal range of motion.  Neurological: He is alert.  Skin: Skin is warm and dry.  Psychiatric: His affect is blunt. His speech is delayed. He is slowed. Thought content is not paranoid. Cognition and memory are impaired. He expresses impulsivity. He expresses no homicidal and no suicidal ideation.    Review of Systems  Constitutional: Negative.   HENT: Negative.   Eyes: Negative.   Respiratory: Negative.   Cardiovascular: Negative.   Gastrointestinal: Negative.   Musculoskeletal: Negative.   Skin: Negative.   Neurological: Negative.   Psychiatric/Behavioral: Negative for depression, hallucinations, memory loss, substance abuse and suicidal ideas. The patient is nervous/anxious and has insomnia.     Blood pressure 127/77, pulse 82, temperature 98.3 F (36.8 C), resp. rate 18, height 6' (1.829 m), weight 104.3 kg (230 lb), SpO2 99 %.Body mass index is 31.19 kg/m.  General Appearance: Casual  Eye Contact:  Good  Speech:  Clear and Coherent  Volume:  Normal  Mood:  Euthymic  Affect:  Congruent  Thought Process:  Goal Directed  Orientation:  Full (Time, Place, and Person)  Thought Content:  Logical  Suicidal Thoughts:  No  Homicidal Thoughts:  No  Memory:  Immediate;   Good Recent;   Fair Remote;   Fair  Judgement:  Fair  Insight:  Fair  Psychomotor Activity:  Decreased  Concentration:  Concentration: Fair  Recall:  [employer] of Knowledge:  Fair  Language:  Fair  Akathisia:  No  Handed:  Right  AIMS (if indicated):     Assets:  Desire for Improvement Housing Physical Health Resilience Social Support   ADL's:  Intact  Cognition:  WNL  Sleep:        Treatment Plan Summary: Plan This is a 27 year old man with schizophrenia.  Came to the hospital voluntarily.  Had some transient symptoms of paranoia but did not act out did not do anything dangerous.  Patient denies suicidal or homicidal thoughts.  Shows insight into the fact that his thoughts last night were unrealistic.  Patient appears to be medically stable and to a return to his mental baseline.  Does not meet commitment criteria.  I suggested to him the possibility of voluntary admission but he does not want to do that.  Patient is encouraged to stay off of marijuana and alcohol and to continue his usual outpatient treatment.  Patient can be discharged back home Case reviewed with emergency room physician and TTS.  Disposition: No evidence of imminent risk to self or others at present.   Patient does not meet criteria for psychiatric inpatient admission. Supportive therapy provided about ongoing stressors.  Alethia Berthold, MD 03/25/2018 6:24 PM

## 2018-03-25 NOTE — ED Provider Notes (Signed)
Cleared for d/c by Dr. Toni Amend of psychiatry    Jene Every, MD 03/25/18 1236

## 2018-03-25 NOTE — BH Assessment (Signed)
TTS attempted 2nd visit to complete assessment. Pt asked this writer "can you please come back later if you don't mind?" Pt visibly drowsy and having a had time arousing. Will return in a few hours to complete.

## 2018-03-25 NOTE — ED Notes (Signed)
BEHAVIORAL HEALTH ROUNDING Patient sleeping: No. Patient alert and oriented: yes Behavior appropriate: Yes.  ; If no, describe:  Nutrition and fluids offered: yes Toileting and hygiene offered: Yes  Sitter present: q15 minute observations and security monitoring Law enforcement present: Yes    

## 2018-03-25 NOTE — ED Notes (Signed)
ED Is the patient under IVC or is there intent for IVC: Yes.   Is the patient medically cleared: Yes.   Is there vacancy in the ED BHU: Yes.   Is the population mix appropriate for patient: Yes.   Is the patient awaiting placement in inpatient or outpatient setting:  Has the patient had a psychiatric consult: pending  Survey of unit performed for contraband, proper placement and condition of furniture, tampering with fixtures in bathroom, shower, and each patient room: Yes.  ; Findings:  APPEARANCE/BEHAVIOR Calm and cooperative NEURO ASSESSMENT Orientation: oriented x3  Denies pain Hallucinations: No.None noted (Hallucinations) Speech: Normal Gait: normal RESPIRATORY ASSESSMENT Even  Unlabored respirations  CARDIOVASCULAR ASSESSMENT Pulses equal   regular rate  Skin warm and dry   GASTROINTESTINAL ASSESSMENT no GI complaint EXTREMITIES Full ROM  PLAN OF CARE Provide calm/safe environment. Vital signs assessed twice daily. ED BHU Assessment once each 12-hour shift. Collaborate with TTS daily or as condition indicates. Assure the ED provider has rounded once each shift. Provide and encourage hygiene. Provide redirection as needed. Assess for escalating behavior; address immediately and inform ED provider.  Assess family dynamic and appropriateness for visitation as needed: Yes.  ; If necessary, describe findings:  Educate the patient/family about BHU procedures/visitation: Yes.  ; If necessary, describe findings:   

## 2018-08-05 ENCOUNTER — Emergency Department
Admission: EM | Admit: 2018-08-05 | Discharge: 2018-08-05 | Disposition: A | Discharge status: Home / Self Care | Payer: Medicaid Other | Attending: Emergency Medicine | Admitting: Emergency Medicine

## 2018-08-05 ENCOUNTER — Other Ambulatory Visit: Payer: Self-pay

## 2018-08-05 ENCOUNTER — Encounter: Payer: Self-pay | Admitting: Emergency Medicine

## 2018-08-05 DIAGNOSIS — H6123 Impacted cerumen, bilateral: Secondary | ICD-10-CM | POA: Insufficient documentation

## 2018-08-05 DIAGNOSIS — Z79899 Other long term (current) drug therapy: Secondary | ICD-10-CM | POA: Diagnosis not present

## 2018-08-05 DIAGNOSIS — H9203 Otalgia, bilateral: Secondary | ICD-10-CM | POA: Diagnosis present

## 2018-08-05 DIAGNOSIS — F17210 Nicotine dependence, cigarettes, uncomplicated: Secondary | ICD-10-CM | POA: Insufficient documentation

## 2018-08-05 MED ORDER — DOCUSATE SODIUM 50 MG/5ML PO LIQD
ORAL | Status: AC
  Filled 2018-08-05: qty 10

## 2018-08-05 MED ORDER — DOCUSATE SODIUM 50 MG/5ML PO LIQD
50.0000 mg | Freq: Once | ORAL | Status: AC
  Administered 2018-08-05: 50 mg via ORAL
  Filled 2018-08-05: qty 10

## 2018-08-05 MED ORDER — DOCUSATE SODIUM 50 MG/5ML PO LIQD
50.0000 mg | Freq: Once | ORAL | Status: AC
  Administered 2018-08-05: 50 mg via ORAL

## 2018-08-05 MED ORDER — DOCUSATE SODIUM 50 MG/5ML PO LIQD
100.0000 mg | Freq: Once | ORAL | Status: AC
  Administered 2018-08-05: 100 mg via ORAL

## 2018-08-05 MED ORDER — DOCUSATE SODIUM 50 MG/5ML PO LIQD
ORAL | Status: AC
  Administered 2018-08-05: 100 mg via ORAL
  Filled 2018-08-05: qty 10

## 2018-08-05 NOTE — ED Notes (Signed)
Pt discharged to lobby, states he is going to use cell phone to contact ACT team member to provide transportation.

## 2018-08-05 NOTE — Discharge Instructions (Signed)
Obtain Debrox or any of the over-the-counter earwax removal drops to applied to your ear.  Do not use Q-tips as this packs the cerumen down which makes it more difficult to remove.  Follow-up with Dr. Jenne Campus if any continued problems.  May take Tylenol or ibuprofen as needed for ear pain.

## 2018-08-05 NOTE — ED Triage Notes (Signed)
Pt comes into the ED via POV c/o right otalgia that started yesterday.  Patient has nasal congestion as well.  Patient explains that the ear pain is creating a headache.  Patient in NAD at this time with even and unlabored respirations.

## 2018-08-05 NOTE — ED Provider Notes (Signed)
[facility] Emergency Department Provider Note   ____________________________________________   First MD Initiated Contact with Patient 08/05/18 1418     (approximate)  I have reviewed the triage vital signs and the nursing notes.   HISTORY  Chief Complaint Otalgia   HPI Daniel Craig is a 27 y.o. male present with ear pain since yesterday.  Patient states that his ear pain is causing him to have a headache.  He denies any trauma to his ear.  He reports decreased hearing from his right ear.  He denies any fever, chills, drainage from his ear.   Past Medical History:  Diagnosis Date  . Schizophrenia (HCC)   . Seizures [facility])     Patient Active Problem List   Diagnosis Date Noted  . Schizophrenia, paranoid (HCC) 02/27/2018  . Cannabis use disorder, moderate, dependence (HCC) 02/27/2018  . Tobacco use disorder 02/27/2018  . 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 Altered mental status 12/28/2017  . Fever 08/11/2017  . Acute URI 08/11/2017  . Delirium 06/25/2017  . Schizophrenia (HCC) 06/25/2017  . Seizures (HCC) 06/25/2017    Past Surgical History:  Procedure Laterality Date  . MOUTH SURGERY      Prior to Admission medications   Medication Sig Start Date End Date Taking? Authorizing Provider  divalproex (DEPAKOTE ER) 500 MG 24 hr tablet Take 500-1,000 mg by mouth 2 (two) times daily. Take 1 tablet in the morning and 2 tablets at bedtime.   Yes [provider]  lamoTRIgine (LAMICTAL) 100 MG tablet Take 100-200 mg by mouth daily.    Yes [provider]  LORazepam (ATIVAN) 1 MG tablet Take 1 tablet (1 mg total) by mouth 2 (two) times daily as needed for seizure (or shaking). 01/06/17  Yes Jennye Moccasin, MD  OLANZapine (ZYPREXA) 5 MG tablet Take 5 mg by mouth at bedtime.    Yes [provider]  propranolol (INDERAL) 60 MG tablet Take 60 mg by mouth daily.   Yes [provider]  risperiDONE (RISPERDAL) 3 MG  tablet Take 3 mg by mouth at bedtime.   Yes [provider]  Vitamin D, Ergocalciferol, (DRISDOL) 50000 units CAPS capsule Take 50,000 Units by mouth every 7 (seven) days.   Yes [provider]    Allergies Keppra [levetiracetam]; Trileptal [oxcarbazepine]; and Vimpat [lacosamide]  Family History  Family history unknown: Yes    Social History Social History   Tobacco Use  . Smoking status: Current Every Day Smoker    Packs/day: 1.00    Types: Cigarettes  . Smokeless tobacco: Never Used  Substance Use Topics  . Alcohol use: Yes    Alcohol/week: 4.0 standard drinks    Types: 4 Shots of liquor per week    Comment: pt states 4 shots of liqour oer week  . Drug use: Yes    Types: Marijuana    Review of Systems Constitutional: No fever/chills Eyes: No visual changes. ENT: Right ear pain.  Decreased hearing right ear. Cardiovascular: Denies chest pain. Respiratory: Denies shortness of breath. Gastrointestinal: No abdominal pain.  No nausea, no vomiting.  Musculoskeletal: Negative for muscle aches. Skin: Negative for rash. Neurological: Positive for headache, negative for focal weakness or numbness. ____________________________________________   PHYSICAL EXAM:  VITAL SIGNS: ED Triage Vitals  Enc Vitals Group     BP 08/05/18 1342 106/71     Pulse Rate 08/05/18 1342 65     Resp 08/05/18 1342 18     Temp 08/05/18 1342 98.6  F (37 C)     Temp Source 08/05/18 1342 Oral     SpO2 08/05/18 1342 100 %     Weight 08/05/18 1343 208 lb (94.3 kg)     Height 08/05/18 1343 6' (1.829 m)     Head Circumference --      Peak Flow --      Pain Score 08/05/18 1343 9     Pain Loc --      Pain Edu? --      Excl. in GC? --    Constitutional: Alert and oriented. Well appearing and in no acute distress. Eyes: Conjunctivae are normal.  Head: Atraumatic. Nose: Minimal congestion/rhinnorhea.  Bilateral cerumen impaction.  Canals were lavaged after Colace was allowed to  loosen up the cerumen.  Left EAC is completely cleared of cerumen without erythema noted to the TM.  Right EAC is partially occluded with cerumen but TM is visible without erythema noted. Mouth/Throat: Mucous membranes are moist.  Oropharynx non-erythematous. Neck: No stridor.   Hematological/Lymphatic/Immunilogical: No cervical lymphadenopathy. Cardiovascular: Normal rate, regular rhythm. Grossly normal heart sounds.  Good peripheral circulation. Respiratory: Normal respiratory effort.  No retractions. Lungs CTAB. Gastrointestinal: Soft and nontender. No distention. No abdominal bruits. No CVA tenderness. Musculoskeletal: Moves upper and lower extremities without any difficulty.  Normal gait was noted. Neurologic:  Normal speech and language. No gross focal neurologic deficits are appreciated. No gait instability. Skin:  Skin is warm, dry and intact. No rash noted. Psychiatric: Mood and affect are normal. Speech and behavior are normal.  ____________________________________________   LABS (all labs ordered are listed, but only abnormal results are displayed)  Labs Reviewed - No data to display  PROCEDURES  Procedure(s) performed: None  Procedures  Critical Care performed: No  ____________________________________________   INITIAL IMPRESSION / ASSESSMENT AND PLAN / ED COURSE  As part of my medical decision making, I reviewed the following data within the electronic MEDICAL RECORD NUMBER Notes from prior ED visits and [place] Controlled Substance Database  Patient presents to the emergency department complaint of right otalgia starting yesterday.  Pain is a result of cerumen impaction in which exam revealed bilateral impaction.  Colace was placed in both canals and cerumen was completely removed from the left EAC and a small amount remains in the right EAC.  TMs are without erythema or injection.  Patient is instructed to discontinue using Q-tips to clean his ears.  He is instructed to  follow-up with Dr. Jenne Campus who is on-call for ENT if any continued problems with his ears.  ____________________________________________   FINAL CLINICAL IMPRESSION(S) / ED DIAGNOSES  Final diagnoses:  Bilateral impacted cerumen     ED Discharge Orders    None       Note:  This document was prepared using Dragon voice recognition software and may include unintentional dictation errors.    Tommi Rumps, PA-C 08/05/18 1657    Schaevitz, Myra Rude, MD 08/09/18 1314

## 2018-11-04 ENCOUNTER — Other Ambulatory Visit: Payer: Self-pay

## 2018-11-04 ENCOUNTER — Emergency Department: Payer: Medicaid Other

## 2018-11-04 ENCOUNTER — Emergency Department
Admission: EM | Admit: 2018-11-04 | Discharge: 2018-11-04 | Disposition: A | Discharge status: Home / Self Care | Payer: Medicaid Other | Attending: Student in an Organized Health Care Education/Training Program | Admitting: Student in an Organized Health Care Education/Training Program

## 2018-11-04 ENCOUNTER — Encounter: Payer: Self-pay | Admitting: Emergency Medicine

## 2018-11-04 DIAGNOSIS — Z79899 Other long term (current) drug therapy: Secondary | ICD-10-CM | POA: Insufficient documentation

## 2018-11-04 DIAGNOSIS — R51 Headache: Secondary | ICD-10-CM | POA: Diagnosis not present

## 2018-11-04 DIAGNOSIS — F17210 Nicotine dependence, cigarettes, uncomplicated: Secondary | ICD-10-CM | POA: Insufficient documentation

## 2018-11-04 DIAGNOSIS — R569 Unspecified convulsions: Secondary | ICD-10-CM

## 2018-11-04 DIAGNOSIS — F1210 Cannabis abuse, uncomplicated: Secondary | ICD-10-CM | POA: Diagnosis not present

## 2018-11-04 LAB — URINE DRUG SCREEN, QUALITATIVE (ARMC ONLY)
AMPHETAMINES, UR SCREEN: NOT DETECTED
Barbiturates, Ur Screen: NOT DETECTED
Benzodiazepine, Ur Scrn: NOT DETECTED
COCAINE METABOLITE, UR ~~LOC~~: NOT DETECTED
Cannabinoid 50 Ng, Ur ~~LOC~~: POSITIVE — AB
MDMA (ECSTASY) UR SCREEN: NOT DETECTED
Methadone Scn, Ur: NOT DETECTED
Opiate, Ur Screen: NOT DETECTED
PHENCYCLIDINE (PCP) UR S: NOT DETECTED
Tricyclic, Ur Screen: NOT DETECTED

## 2018-11-04 LAB — COMPREHENSIVE METABOLIC PANEL
ALBUMIN: 3.9 g/dL (ref 3.5–5.0)
ALT: 18 U/L (ref 0–44)
AST: 32 U/L (ref 15–41)
Alkaline Phosphatase: 29 U/L — ABNORMAL LOW (ref 38–126)
Anion gap: 8 (ref 5–15)
BILIRUBIN TOTAL: 0.7 mg/dL (ref 0.3–1.2)
BUN: 8 mg/dL (ref 6–20)
CO2: 25 mmol/L (ref 22–32)
Calcium: 9.4 mg/dL (ref 8.9–10.3)
Chloride: 105 mmol/L (ref 98–111)
Creatinine, Ser: 0.96 mg/dL (ref 0.61–1.24)
GFR calc Af Amer: >60 mL/min (ref >60)
GFR calc non Af Amer: >60 mL/min (ref >60)
GLUCOSE: 97 mg/dL (ref 70–99)
POTASSIUM: 4 mmol/L (ref 3.5–5.1)
Sodium: 138 mmol/L (ref 135–145)
Total Protein: 7.4 g/dL (ref 6.5–8.1)

## 2018-11-04 LAB — CBC WITH DIFFERENTIAL/PLATELET
Abs Immature Granulocytes: 0.04 10*3/uL (ref 0.00–0.07)
BASOS ABS: 0 10*3/uL (ref 0.0–0.1)
Basophils Relative: 0 %
EOS ABS: 0 10*3/uL (ref 0.0–0.5)
Eosinophils Relative: 1 %
HEMATOCRIT: 40.7 % (ref 39.0–52.0)
Hemoglobin: 14.1 g/dL (ref 13.0–17.0)
IMMATURE GRANULOCYTES: 1 %
LYMPHS ABS: 2.8 10*3/uL (ref 0.7–4.0)
Lymphocytes Relative: 50 %
MCH: 32 pg (ref 26.0–34.0)
MCHC: 34.6 g/dL (ref 30.0–36.0)
MCV: 92.5 fL (ref 80.0–100.0)
Monocytes Absolute: 0.6 10*3/uL (ref 0.1–1.0)
Monocytes Relative: 11 %
NEUTROS PCT: 37 %
NRBC: 0 % (ref 0.0–0.2)
Neutro Abs: 2 10*3/uL (ref 1.7–7.7)
Platelets: 213 10*3/uL (ref 150–400)
RBC: 4.4 MIL/uL (ref 4.22–5.81)
RDW: 12.6 % (ref 11.5–15.5)
WBC: 5.5 10*3/uL (ref 4.0–10.5)

## 2018-11-04 LAB — ETHANOL: Alcohol, Ethyl (B): <10 mg/dL (ref <10)

## 2018-11-04 LAB — ACETAMINOPHEN LEVEL: Acetaminophen (Tylenol), Serum: <10 ug/mL — ABNORMAL LOW (ref 10–30)

## 2018-11-04 LAB — VALPROIC ACID LEVEL: Valproic Acid Lvl: 102 ug/mL — ABNORMAL HIGH (ref 50.0–100.0)

## 2018-11-04 NOTE — ED Notes (Signed)
Patient visualized in exam room from nurses desk in no acute distress.

## 2018-11-04 NOTE — ED Triage Notes (Signed)
Pt states he had a witnessed grand mal seizure this morning. Pt's life alert was pressed by witness. Pt did not seek medical attention after seizure. Pt states he has struggled to stay awake since seizure. Pt falls asleep quickly in triage. Pt reports compliance with seizure medication. Pt oriented x 4.

## 2018-11-04 NOTE — ED Notes (Signed)
Attempted to call pt's father (legal guardian) with no answer.

## 2018-11-04 NOTE — ED Provider Notes (Signed)
[facility] Emergency Department Provider Note    First MD Initiated Contact with Patient 11/04/18 2010     (approximate)  I have reviewed the triage vital signs and the nursing notes.   HISTORY  Chief Complaint Seizures    HPI Daniel Craig is a 27 y.o. male below listed past medical history presents the ER after seizure activity occurred group home today.  States his been compliant with his medications.  States his last seizure was in early November.  Denies any recent trauma.  Does have mild headache behind his eyes which he says is typical for his seizures.  Denies any anxiety.  No chest pain or shortness of breath.  No hallucinations.  Denies any SI or HI.  Denies any other substance abuse.    Past Medical History:  Diagnosis Date  . Schizophrenia (HCC)   . Seizures (HCC)    Family History  Family history unknown: Yes   Past Surgical History:  Procedure Laterality Date  . MOUTH SURGERY     Patient Active Problem List   Diagnosis Date Noted  . Schizophrenia, paranoid (HCC) 02/27/2018  . Cannabis use disorder, moderate, dependence (HCC) 02/27/2018  . Tobacco use disorder 02/27/2018  . 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 Altered mental status 12/28/2017  . Fever 08/11/2017  . Acute URI 08/11/2017  . Delirium 06/25/2017  . Schizophrenia (HCC) 06/25/2017  . Seizures (HCC) 06/25/2017      Prior to Admission medications   Medication Sig Start Date End Date Taking? Authorizing Provider  divalproex (DEPAKOTE ER) 500 MG 24 hr tablet Take 500-1,000 mg by mouth 2 (two) times daily. Take 1 tablet in the morning and 2 tablets at bedtime.    [provider]  lamoTRIgine (LAMICTAL) 100 MG tablet Take 100-200 mg by mouth daily.     [provider]  LORazepam (ATIVAN) 1 MG tablet Take 1 tablet (1 mg total) by mouth 2 (two) times daily as needed for seizure (or shaking). 01/06/17   Jennye Moccasin, MD  OLANZapine (ZYPREXA) 5 MG  tablet Take 5 mg by mouth at bedtime.     [provider]  propranolol (INDERAL) 60 MG tablet Take 60 mg by mouth daily.    [provider]  risperiDONE (RISPERDAL) 3 MG tablet Take 3 mg by mouth at bedtime.    [provider]  Vitamin D, Ergocalciferol, (DRISDOL) 50000 units CAPS capsule Take 50,000 Units by mouth every 7 (seven) days.    [provider]    Allergies Keppra [levetiracetam]; Trileptal [oxcarbazepine]; and Vimpat [lacosamide]    Social History Social History   Tobacco Use  . Smoking status: Current Every Day Smoker    Packs/day: 1.00    Types: Cigarettes  . Smokeless tobacco: Never Used  Substance Use Topics  . Alcohol use: Yes    Alcohol/week: 4.0 standard drinks    Types: 4 Shots of liquor per week    Comment: pt states 4 shots of liqour oer week  . Drug use: Yes    Types: Marijuana    Review of Systems Patient denies headaches, rhinorrhea, blurry vision, numbness, shortness of breath, chest pain, edema, cough, abdominal pain, nausea, vomiting, diarrhea, dysuria, fevers, rashes or hallucinations unless otherwise stated above in HPI. ____________________________________________   PHYSICAL EXAM:  VITAL SIGNS: Vitals:   11/04/18 1748  BP: 124/71  Pulse: 88  Resp: 18  Temp: 98.3 F (36.8 C)  SpO2: 98%    Constitutional: Alert and oriented.  Eyes: Conjunctivae are normal.  Head: Atraumatic. Nose: No congestion/rhinnorhea. Mouth/Throat: Mucous membranes are moist.  Contusion and abrasion to bilateral inside of cheeks consistent with bite injury Neck: No stridor. Painless ROM.  Cardiovascular: Normal rate, regular rhythm. Grossly normal heart sounds.  Good peripheral circulation. Respiratory: Normal respiratory effort.  No retractions. Lungs CTAB. Gastrointestinal: Soft and nontender. No distention. No abdominal bruits. No CVA tenderness. Genitourinary:  Musculoskeletal: No lower extremity tenderness nor edema.  No  joint effusions. Neurologic:  Normal speech and language. No gross focal neurologic deficits are appreciated. No facial droop Skin:  Skin is warm, dry and intact. No rash noted. Psychiatric: Mood and affect are normal. Speech and behavior are normal.  ____________________________________________   LABS (all labs ordered are listed, but only abnormal results are displayed)  Results for orders placed or performed during the hospital encounter of 11/04/18 (from the past 24 hour(s))  CBC with Differential     Status: None   Collection Time: 11/04/18  6:02 PM  Result Value Ref Range   WBC 5.5 4.0 - 10.5 K/uL   RBC 4.40 4.22 - 5.81 MIL/uL   Hemoglobin 14.1 13.0 - 17.0 g/dL   HCT 82.940.7 56.239.0 - 13.052.0 %   MCV 92.5 80.0 - 100.0 fL   MCH 32.0 26.0 - 34.0 pg   MCHC 34.6 30.0 - 36.0 g/dL   RDW 86.512.6 78.411.5 - 69.615.5 %   Platelets 213 150 - 400 K/uL   nRBC 0.0 0.0 - 0.2 %   Neutrophils Relative % 37 %   Neutro Abs 2.0 1.7 - 7.7 K/uL   Lymphocytes Relative 50 %   Lymphs Abs 2.8 0.7 - 4.0 K/uL   Monocytes Relative 11 %   Monocytes Absolute 0.6 0.1 - 1.0 K/uL   Eosinophils Relative 1 %   Eosinophils Absolute 0.0 0.0 - 0.5 K/uL   Basophils Relative 0 %   Basophils Absolute 0.0 0.0 - 0.1 K/uL   Immature Granulocytes 1 %   Abs Immature Granulocytes 0.04 0.00 - 0.07 K/uL  Comprehensive metabolic panel     Status: Abnormal   Collection Time: 11/04/18  6:02 PM  Result Value Ref Range   Sodium 138 135 - 145 mmol/L   Potassium 4.0 3.5 - 5.1 mmol/L   Chloride 105 98 - 111 mmol/L   CO2 25 22 - 32 mmol/L   Glucose, Bld 97 70 - 99 mg/dL   BUN 8 6 - 20 mg/dL   Creatinine, Ser 2.950.96 0.61 - 1.24 mg/dL   Calcium 9.4 8.9 - 28.410.3 mg/dL   Total Protein 7.4 6.5 - 8.1 g/dL   Albumin 3.9 3.5 - 5.0 g/dL   AST 32 15 - 41 U/L   ALT 18 0 - 44 U/L   Alkaline Phosphatase 29 (L) 38 - 126 U/L   Total Bilirubin 0.7 0.3 - 1.2 mg/dL   GFR calc non Af Amer >60 >60 mL/min   GFR calc Af Amer >60 >60 mL/min   Anion gap 8 5 -  15  Valproic Acid (depakote) Level (if patient is taking this medication)     Status: Abnormal   Collection Time: 11/04/18  7:33 PM  Result Value Ref Range   Valproic Acid Lvl 102 (H) 50.0 - 100.0 ug/mL  Acetaminophen level     Status: Abnormal   Collection Time: 11/04/18  8:12 PM  Result Value Ref Range   Acetaminophen (Tylenol), Serum <10 (L) 10 - 30 ug/mL  Ethanol     Status: None  Collection Time: 11/04/18  8:12 PM  Result Value Ref Range   Alcohol, Ethyl (B) <10 <10 mg/dL  Urine Drug Screen, Qualitative ([facility] only)     Status: Abnormal   Collection Time: 11/04/18  8:32 PM  Result Value Ref Range   Tricyclic, Ur Screen NONE DETECTED NONE DETECTED   Amphetamines, Ur Screen NONE DETECTED NONE DETECTED   MDMA (Ecstasy)Ur Screen NONE DETECTED NONE DETECTED   Cocaine Metabolite,Ur [place] NONE DETECTED NONE DETECTED   Opiate, Ur Screen NONE DETECTED NONE DETECTED   Phencyclidine (PCP) Ur S NONE DETECTED NONE DETECTED   Cannabinoid 50 Ng, Ur [place] POSITIVE (A) NONE DETECTED   Barbiturates, Ur Screen NONE DETECTED NONE DETECTED   Benzodiazepine, Ur Scrn NONE DETECTED NONE DETECTED   Methadone Scn, Ur NONE DETECTED NONE DETECTED   ____________________________________________ ____________________________________________  RADIOLOGY  I personally reviewed all radiographic images ordered to evaluate for the above acute complaints and reviewed radiology reports and findings.  These findings were personally discussed with the patient.  Please see medical record for radiology report.  ____________________________________________   PROCEDURES  Procedure(s) performed:  Procedures    Critical Care performed: no ____________________________________________   INITIAL IMPRESSION / ASSESSMENT AND PLAN / ED COURSE  Pertinent labs & imaging results that were available during my care of the patient were reviewed by me and considered in my medical decision making (see chart for details).     DDX: Seizure, electrolyte abnormality, head injury, encephalopathy, versus abuse, noncompliance  Cathy Easten Maceachern is a 27 y.o. who presents to the ED with known seizure disorder presents the ER as described above.  He is afebrile [place] dynamically stable.  Neuro exam is reassuring patient is drowsy..  States he does have new headache and given no recent imaging will order CT imaging to further evaluate.  Will check blood work for the above differential.  Will check Depakote level.  Clinical Course as of Nov 04 2117  Tue Nov 04, 2018  2115 CT imaging is reassuring.  Patient alert and oriented times 3.  No additional seizure-like activity.  Depakote level is appropriate borderline on the upper level of normal.  At this point I do believe he stable and appropriate for outpatient follow-up.   [PR]    Clinical Course User Index [PR] Willy Eddy, MD     As part of my medical decision making, I reviewed the following data within the electronic MEDICAL RECORD NUMBER Nursing notes reviewed and incorporated, Labs reviewed, notes from prior ED visit. ____________________________________________   FINAL CLINICAL IMPRESSION(S) / ED DIAGNOSES  Final diagnoses:  Seizure (HCC)      NEW MEDICATIONS STARTED DURING THIS VISIT:  New Prescriptions   No medications on file     Note:  This document was prepared using Dragon voice recognition software and may include unintentional dictation errors.    Willy Eddy, MD 11/04/18 2118

## 2018-11-09 ENCOUNTER — Inpatient Hospital Stay
Admission: EM | Admit: 2018-11-09 | Discharge: 2018-11-11 | DRG: 641 | Disposition: A | Discharge status: Home / Self Care | Payer: Medicaid Other | Attending: Internal Medicine | Admitting: Internal Medicine

## 2018-11-09 ENCOUNTER — Other Ambulatory Visit: Payer: Self-pay

## 2018-11-09 DIAGNOSIS — F17210 Nicotine dependence, cigarettes, uncomplicated: Secondary | ICD-10-CM | POA: Diagnosis present

## 2018-11-09 DIAGNOSIS — F209 Schizophrenia, unspecified: Secondary | ICD-10-CM | POA: Diagnosis present

## 2018-11-09 DIAGNOSIS — Z23 Encounter for immunization: Secondary | ICD-10-CM | POA: Diagnosis not present

## 2018-11-09 DIAGNOSIS — G40409 Other generalized epilepsy and epileptic syndromes, not intractable, without status epilepticus: Secondary | ICD-10-CM | POA: Diagnosis present

## 2018-11-09 DIAGNOSIS — E162 Hypoglycemia, unspecified: Secondary | ICD-10-CM | POA: Diagnosis present

## 2018-11-09 DIAGNOSIS — R569 Unspecified convulsions: Secondary | ICD-10-CM

## 2018-11-09 DIAGNOSIS — Z888 Allergy status to other drugs, medicaments and biological substances status: Secondary | ICD-10-CM

## 2018-11-09 DIAGNOSIS — Z79899 Other long term (current) drug therapy: Secondary | ICD-10-CM | POA: Diagnosis not present

## 2018-11-09 LAB — CBC
HCT: 43.2 % (ref 39.0–52.0)
Hemoglobin: 14.8 g/dL (ref 13.0–17.0)
MCH: 32 pg (ref 26.0–34.0)
MCHC: 34.3 g/dL (ref 30.0–36.0)
MCV: 93.3 fL (ref 80.0–100.0)
Platelets: 172 10*3/uL (ref 150–400)
RBC: 4.63 MIL/uL (ref 4.22–5.81)
RDW: 12.8 % (ref 11.5–15.5)
WBC: 5.8 10*3/uL (ref 4.0–10.5)
nRBC: 0 % (ref 0.0–0.2)

## 2018-11-09 LAB — COMPREHENSIVE METABOLIC PANEL
ALBUMIN: 4.2 g/dL (ref 3.5–5.0)
ALT: 23 U/L (ref 0–44)
ANION GAP: 9 (ref 5–15)
AST: 37 U/L (ref 15–41)
Alkaline Phosphatase: 28 U/L — ABNORMAL LOW (ref 38–126)
BUN: 11 mg/dL (ref 6–20)
CO2: 24 mmol/L (ref 22–32)
Calcium: 9.4 mg/dL (ref 8.9–10.3)
Chloride: 108 mmol/L (ref 98–111)
Creatinine, Ser: 1.12 mg/dL (ref 0.61–1.24)
GLUCOSE: 80 mg/dL (ref 70–99)
POTASSIUM: 3.4 mmol/L — AB (ref 3.5–5.1)
Sodium: 141 mmol/L (ref 135–145)
TOTAL PROTEIN: 7.5 g/dL (ref 6.5–8.1)
Total Bilirubin: 0.7 mg/dL (ref 0.3–1.2)

## 2018-11-09 LAB — CK: CK TOTAL: 170 U/L (ref 49–397)

## 2018-11-09 LAB — VALPROIC ACID LEVEL: VALPROIC ACID LVL: 100 ug/mL (ref 50.0–100.0)

## 2018-11-09 MED ORDER — DEXTROSE 50 % IV SOLN
25.0000 mL | Freq: Once | INTRAVENOUS | Status: AC
  Administered 2018-11-09: 25 mL via INTRAVENOUS

## 2018-11-09 MED ORDER — DEXTROSE-NACL 5-0.45 % IV SOLN
INTRAVENOUS | Status: DC
  Administered 2018-11-09 – 2018-11-11 (×4): via INTRAVENOUS

## 2018-11-09 MED ORDER — VALPROATE SODIUM 500 MG/5ML IV SOLN
500.0000 mg | Freq: Three times a day (TID) | INTRAVENOUS | Status: DC
  Administered 2018-11-10: 500 mg via INTRAVENOUS
  Filled 2018-11-09 (×2): qty 5

## 2018-11-09 MED ORDER — DEXTROSE 50 % IV SOLN
INTRAVENOUS | Status: AC
  Filled 2018-11-09: qty 50

## 2018-11-09 NOTE — ED Notes (Signed)
Pt ate the sandwich and chips and orange juice. Given a cola as well. Pt alert. Only c/o pain in "all his muscles"

## 2018-11-09 NOTE — ED Triage Notes (Signed)
Ems called out for seizure. Hx of same and had seizure yesterday and again today prior to ems arrival. Ems found pt had blood sugar less then able to read (<20). Was given glucogon and 200cc d10w. Pt had another seizure with ems and he was given versed 2mg .. bs went up to 241 after tx.

## 2018-11-09 NOTE — ED Notes (Signed)
Pt had a bs of 42. Dr made aware and pt given 1/2 amp dextrose. Pt then given dinner tray with orange juice.

## 2018-11-09 NOTE — ED Notes (Signed)
Recheck of bs is 70

## 2018-11-09 NOTE — H&P (Signed)
[facility] [place] [facility]   PATIENT NAME: Daniel Craig    MR#:  [id number]  DATE OF BIRTH:  [birth year]  DATE OF ADMISSION:  11/09/2018  PRIMARY CARE PHYSICIAN: System, Pcp Not In   REQUESTING/REFERRING PHYSICIAN: Cyril Loosen, MD  CHIEF COMPLAINT:   Chief Complaint  Patient presents with  . Hypoglycemia    HISTORY OF PRESENT ILLNESS:  Daniel Craig  is a 27 y.o. male who presents with chief complaint as above.  Patient is postictal and not able to provide information to the HPI.  Daniel Craig will wake up briefly to talk, only one-word responses and nothing significant.  Per ED physician's report and EMS report Daniel Craig was found at home after having seizure-like activity.  His glucose was found to be very low, reportedly in the 20s.  Daniel Craig was given dextrose and glucagon and brought to the ED for evaluation.  Here in the ED Daniel Craig had what seems to have been a full on seizure, and his glucose was again low having drifted down to about 42.  Daniel Craig was placed on dextrose and hospitalist were called for admission.  Of note, patient does not have a history of diabetes it is unclear why his blood sugar would be low.  PAST MEDICAL HISTORY:   Past Medical History:  Diagnosis Date  . Schizophrenia (HCC)   . Seizures (HCC)      PAST SURGICAL HISTORY:   Past Surgical History:  Procedure Laterality Date  . MOUTH SURGERY       SOCIAL HISTORY:   Social History   Tobacco Use  . Smoking status: Current Every Day Smoker    Packs/day: 1.00    Types: Cigarettes  . Smokeless tobacco: Never Used  Substance Use Topics  . Alcohol use: Yes    Alcohol/week: 4.0 standard drinks    Types: 4 Shots of liquor per week    Comment: pt states 4 shots of liqour oer week     FAMILY HISTORY:   Family History  Family history unknown: Yes    Patient is unable to relate any family history due to postictal state DRUG ALLERGIES:   Allergies  Allergen Reactions  . Keppra [Levetiracetam]    . Trileptal [Oxcarbazepine]   . Vimpat [Lacosamide]     MEDICATIONS AT HOME:   Prior to Admission medications   Medication Sig Start Date End Date Taking? Authorizing Provider  divalproex (DEPAKOTE ER) 500 MG 24 hr tablet Take 500-1,000 mg by mouth 2 (two) times daily. Take 1 tablet in the morning and 2 tablets at bedtime.    [provider]  lamoTRIgine (LAMICTAL) 100 MG tablet Take 100-200 mg by mouth daily.     [provider]  LORazepam (ATIVAN) 1 MG tablet Take 1 tablet (1 mg total) by mouth 2 (two) times daily as needed for seizure (or shaking). 01/06/17   Jennye Moccasin, MD  OLANZapine (ZYPREXA) 5 MG tablet Take 5 mg by mouth at bedtime.     [provider]  propranolol (INDERAL) 60 MG tablet Take 60 mg by mouth daily.    [provider]  risperiDONE (RISPERDAL) 3 MG tablet Take 3 mg by mouth at bedtime.    [provider]  Vitamin D, Ergocalciferol, (DRISDOL) 50000 units CAPS capsule Take 50,000 Units by mouth every 7 (seven) days.    [provider]    REVIEW OF SYSTEMS:  Review of Systems  Unable to perform ROS: Acuity of condition  VITAL SIGNS:   Vitals:   11/09/18 2130 11/09/18 2200 11/09/18 2230 11/09/18 2300  BP: (!) 149/135 (!) 142/75 118/64 (!) 109/51  Pulse: 75     Resp: 20 (!) 21 20 18   Temp:      TempSrc:      SpO2: 99%     Weight:      Height:       Wt Readings from Last 3 Encounters:  11/09/18 85 kg  11/04/18 85.3 kg  08/05/18 94.3 kg    PHYSICAL EXAMINATION:  Physical Exam  Vitals reviewed. Constitutional: Daniel Craig appears well-developed and well-nourished. No distress.  HENT:  Head: Normocephalic and atraumatic.  Mouth/Throat: Oropharynx is clear and moist.  Eyes: Pupils are equal, round, and reactive to light. Conjunctivae and EOM are normal. No scleral icterus.  Neck: Normal range of motion. Neck supple. No JVD present. No thyromegaly present.  Cardiovascular: Normal rate, regular rhythm  and intact distal pulses. Exam reveals no gallop and no friction rub.  No murmur heard. Respiratory: Effort normal and breath sounds normal. No respiratory distress. Daniel Craig has no wheezes. Daniel Craig has no rales.  GI: Soft. Bowel sounds are normal. Daniel Craig exhibits no distension. There is no abdominal tenderness.  Musculoskeletal: Normal range of motion.        General: No edema.     Comments: No arthritis, no gout  Lymphadenopathy:    Daniel Craig has no cervical adenopathy.  Neurological: No cranial nerve deficit.  Unable to assess due to postictal state  Skin: Skin is warm and dry. No rash noted. No erythema.  Psychiatric:  Unable to assess due to postictal state    LABORATORY PANEL:   CBC Recent Labs  Lab 11/09/18 2129  WBC 5.8  HGB 14.8  HCT 43.2  PLT 172   ------------------------------------------------------------------------------------------------------------------  Chemistries  Recent Labs  Lab 11/09/18 2129  NA 141  K 3.4*  CL 108  CO2 24  GLUCOSE 80  BUN 11  CREATININE 1.12  CALCIUM 9.4  AST 37  ALT 23  ALKPHOS 28*  BILITOT 0.7   ------------------------------------------------------------------------------------------------------------------  Cardiac Enzymes No results for input(s): TROPONINI in the last 168 hours. ------------------------------------------------------------------------------------------------------------------  RADIOLOGY:  No results found.  EKG:   Orders placed or performed during the hospital encounter of 11/09/18  . EKG 12-Lead  . EKG 12-Lead  . EKG 12-Lead  . EKG 12-Lead    IMPRESSION AND PLAN:  Principal Problem:   Seizures (HCC) -unclear what caused his seizures tonight.  Suspect perhaps his hypoglycemia, though the cause of his hypoglycemia is also unclear.  Patient is able to wake up and answer some few questions, and Daniel Craig endorses having taken his antiepileptics as prescribed, though given his postictal state it is unclear how reliable  his answers are.  We will give him an equivalent dose of his antiepileptic medication in IV form tonight, and get a neurology consult Active Problems:   Hypoglycemia -unclear etiology, C-peptide sent by ED physician, currently on dextrose infusion   Schizophrenia (HCC) -continue home meds once patient is able to verify his medications and able to take p.o.  Chart review performed and case discussed with ED provider. Labs, imaging and/or ECG reviewed by provider and discussed with patient/family. Management plans discussed with the patient and/or family.  DVT PROPHYLAXIS: SubQ lovenox   GI PROPHYLAXIS:  None  ADMISSION STATUS: Inpatient     CODE STATUS: Full Code Status History    Date Active Date Inactive Code Status Order ID Comments User Context  02/27/2018 1550 02/28/2018 1837 Full Code [id number]  Audery Amellapacs, John T, MD Inpatient   12/28/2017 0751 12/29/2017 1735 Full Code [id number]  Ihor AustinPyreddy, Pavan, MD Inpatient   08/11/2017 2212 08/12/2017 1759 Full Code [id number]  Marguarite ArbourSparks, Jeffrey D, MD Inpatient      TOTAL TIME TAKING CARE OF THIS PATIENT: 45 minutes.   Kayliegh Boyers FIELDING 11/09/2018, 11:23 PM  [facility]  Office  [phone]  CC: Primary care physician; System, Pcp Not In  Note:  This document was prepared using Dragon voice recognition software and may include unintentional dictation errors.

## 2018-11-09 NOTE — ED Notes (Signed)
Recheck of bs is 98

## 2018-11-09 NOTE — ED Provider Notes (Signed)
[facility] Emergency Department Provider Note   ____________________________________________    I have reviewed the triage vital signs and the nursing notes.   HISTORY  Chief Complaint Hypoglycemia     HPI Daniel Craig is a 27 y.o. male who presents with reports of seizure/hypoglycemia.  Apparently EMS was called out for reported seizure, when they got there they checked her glucose and found to be less than 20, they gave D10 and glucagon with improvement to over 200.  In route to the emergency department the patient did have a seizure and he received Versed 2 mg.  Patient reports he feels well currently although reports feeling "sore all over ".  He does not know why his glucose has been low.  Denies insulin use.  No fevers or chills.  No head injury.  No nausea or vomiting.  No neuro deficits.  Past Medical History:  Diagnosis Date  . Schizophrenia (HCC)   . Seizures [facility])     Patient Active Problem List   Diagnosis Date Noted  . Schizophrenia, paranoid (HCC) 02/27/2018  . Cannabis use disorder, moderate, dependence (HCC) 02/27/2018  . Tobacco use disorder 02/27/2018  . 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 Altered mental status 12/28/2017  . Fever 08/11/2017  . Acute URI 08/11/2017  . Delirium 06/25/2017  . Schizophrenia (HCC) 06/25/2017  . Seizures (HCC) 06/25/2017    Past Surgical History:  Procedure Laterality Date  . MOUTH SURGERY      Prior to Admission medications   Medication Sig Start Date End Date Taking? Authorizing Provider  divalproex (DEPAKOTE ER) 500 MG 24 hr tablet Take 500-1,000 mg by mouth 2 (two) times daily. Take 1 tablet in the morning and 2 tablets at bedtime.    [provider]  lamoTRIgine (LAMICTAL) 100 MG tablet Take 100-200 mg by mouth daily.     [provider]  LORazepam (ATIVAN) 1 MG tablet Take 1 tablet (1 mg total) by mouth 2 (two) times daily as needed for seizure (or shaking). 01/06/17    Jennye Moccasin, MD  OLANZapine (ZYPREXA) 5 MG tablet Take 5 mg by mouth at bedtime.     [provider]  propranolol (INDERAL) 60 MG tablet Take 60 mg by mouth daily.    [provider]  risperiDONE (RISPERDAL) 3 MG tablet Take 3 mg by mouth at bedtime.    [provider]  Vitamin D, Ergocalciferol, (DRISDOL) 50000 units CAPS capsule Take 50,000 Units by mouth every 7 (seven) days.    [provider]     Allergies Keppra [levetiracetam]; Trileptal [oxcarbazepine]; and Vimpat [lacosamide]  Family History  Family history unknown: Yes    Social History Social History   Tobacco Use  . Smoking status: Current Every Day Smoker    Packs/day: 1.00    Types: Cigarettes  . Smokeless tobacco: Never Used  Substance Use Topics  . Alcohol use: Yes    Alcohol/week: 4.0 standard drinks    Types: 4 Shots of liquor per week    Comment: pt states 4 shots of liqour oer week  . Drug use: Yes    Types: Marijuana    Review of Systems  Constitutional: No fever/chills Eyes: No visual changes.  ENT: No sore throat. Cardiovascular: Denies chest pain. Respiratory: Denies shortness of breath. Gastrointestinal: No abdominal pain.   Genitourinary: Negative for dysuria. Musculoskeletal: Body is sore Skin: Negative for rash. Neurological: Negative for headaches    ____________________________________________   PHYSICAL EXAM:  VITAL SIGNS: ED Triage Vitals  Enc Vitals Group     BP 11/09/18 2122 130/90     Pulse Rate 11/09/18 2122 80     Resp 11/09/18 2122 16     Temp 11/09/18 2122 97.9 F (36.6 C)     Temp Source 11/09/18 2122 Oral     SpO2 11/09/18 2122 99 %     Weight 11/09/18 2123 85 kg (187 lb 6.3 oz)     Height 11/09/18 2123 1.829 m (6')     Head Circumference --      Peak Flow --      Pain Score 11/09/18 2122 8     Pain Loc --      Pain Edu? --      Excl. in GC? --     Constitutional: Alert and oriented. No acute distress.  Eyes:  Conjunctivae are normal.  Head: Atraumatic. Nose: No congestion/rhinnorhea. Mouth/Throat: Mucous membranes are moist.    Cardiovascular: Normal rate, regular rhythm. Grossly normal heart sounds.  Good peripheral circulation. Respiratory: Normal respiratory effort.  No retractions. Lungs CTAB. Gastrointestinal: Soft and nontender. No distention.  No CVA tenderness. Genitourinary: deferred Musculoskeletal:  Warm and well perfused, no joint swelling, moves all extremities equally with some discomfort Neurologic:  Normal speech and language. No gross focal neurologic deficits are appreciated.  Skin:  Skin is warm, dry and intact. Psychiatric: Mood and affect are normal. Speech and behavior are normal.  ____________________________________________   LABS (all labs ordered are listed, but only abnormal results are displayed)  Labs Reviewed  CBC  COMPREHENSIVE METABOLIC PANEL  INSULIN AND C-PEPTIDE, SERUM  CK  URINALYSIS, COMPLETE (UACMP) WITH MICROSCOPIC   ____________________________________________  EKG  ED ECG REPORT I, Jene Every, the attending physician, personally viewed and interpreted this ECG.  Date: 11/09/2018  Rhythm: normal sinus rhythm QRS Axis: normal Intervals: normal ST/T Wave abnormalities: normal Narrative Interpretation: no evidence of acute ischemia  ____________________________________________  RADIOLOGY  None ____________________________________________   PROCEDURES  Procedure(s) performed: No  Procedures   Critical Care performed: yes  CRITICAL CARE Performed by: Jene Every   Total critical care time: 30 minutes  Critical care time was exclusive of separately billable procedures and treating other patients.  Critical care was necessary to treat or prevent imminent or life-threatening deterioration.  Critical care was time spent personally by me on the following activities: development of treatment plan with patient and/or  surrogate as well as nursing, discussions with consultants, evaluation of patient's response to treatment, examination of patient, obtaining history from patient or surrogate, ordering and performing treatments and interventions, ordering and review of laboratory studies, ordering and review of radiographic studies, pulse oximetry and re-evaluation of patient's condition.  ____________________________________________   INITIAL IMPRESSION / ASSESSMENT AND PLAN / ED COURSE  Pertinent labs & imaging results that were available during my care of the patient were reviewed by me and considered in my medical decision making (see chart for details).  Patient presents with reports of hypoglycemia, after glucose given he apparently had a seizure as well.  Patient has a history of seizures, seen here 5 days ago at which time he had neuroimaging which was unremarkable.  No history of hypoglycemia or diabetes.  In the emergency department glucose dropped again requiring half an amp of D50.  We have started the patient on D5 half-normal saline, pending labs.  Patient will require admission for further work-up of hypoglycemia    ____________________________________________   FINAL CLINICAL IMPRESSION(S) / ED  DIAGNOSES  Final diagnoses:  Hypoglycemia  Seizure (HCC)        Note:  This document was prepared using Dragon voice recognition software and may include unintentional dictation errors.    Jene EveryKinner, Emilyn Ruble, MD 11/09/18 2220

## 2018-11-10 ENCOUNTER — Other Ambulatory Visit: Payer: Self-pay

## 2018-11-10 DIAGNOSIS — R569 Unspecified convulsions: Secondary | ICD-10-CM

## 2018-11-10 LAB — URINALYSIS, COMPLETE (UACMP) WITH MICROSCOPIC
Bacteria, UA: NONE SEEN
Bilirubin Urine: NEGATIVE
Glucose, UA: NEGATIVE mg/dL
Hgb urine dipstick: NEGATIVE
KETONES UR: 5 mg/dL — AB
Leukocytes, UA: NEGATIVE
Nitrite: NEGATIVE
PH: 6 (ref 5.0–8.0)
Protein, ur: NEGATIVE mg/dL
Specific Gravity, Urine: 1.029 (ref 1.005–1.030)

## 2018-11-10 LAB — BASIC METABOLIC PANEL
Anion gap: 7 (ref 5–15)
BUN: 9 mg/dL (ref 6–20)
CHLORIDE: 110 mmol/L (ref 98–111)
CO2: 24 mmol/L (ref 22–32)
Calcium: 8.7 mg/dL — ABNORMAL LOW (ref 8.9–10.3)
Creatinine, Ser: 0.92 mg/dL (ref 0.61–1.24)
GFR calc Af Amer: >60 mL/min (ref >60)
GFR calc non Af Amer: >60 mL/min (ref >60)
Glucose, Bld: 97 mg/dL (ref 70–99)
Potassium: 3.8 mmol/L (ref 3.5–5.1)
Sodium: 141 mmol/L (ref 135–145)

## 2018-11-10 LAB — GLUCOSE, CAPILLARY
GLUCOSE-CAPILLARY: 42 mg/dL — AB (ref 70–99)
GLUCOSE-CAPILLARY: 70 mg/dL (ref 70–99)
Glucose-Capillary: 100 mg/dL — ABNORMAL HIGH (ref 70–99)
Glucose-Capillary: 114 mg/dL — ABNORMAL HIGH (ref 70–99)
Glucose-Capillary: 90 mg/dL (ref 70–99)
Glucose-Capillary: 91 mg/dL (ref 70–99)
Glucose-Capillary: 91 mg/dL (ref 70–99)
Glucose-Capillary: 94 mg/dL (ref 70–99)
Glucose-Capillary: 96 mg/dL (ref 70–99)
Glucose-Capillary: 96 mg/dL (ref 70–99)
Glucose-Capillary: 99 mg/dL (ref 70–99)

## 2018-11-10 LAB — CBC
HEMATOCRIT: 39.8 % (ref 39.0–52.0)
Hemoglobin: 13.4 g/dL (ref 13.0–17.0)
MCH: 32.1 pg (ref 26.0–34.0)
MCHC: 33.7 g/dL (ref 30.0–36.0)
MCV: 95.2 fL (ref 80.0–100.0)
Platelets: 117 10*3/uL — ABNORMAL LOW (ref 150–400)
RBC: 4.18 MIL/uL — ABNORMAL LOW (ref 4.22–5.81)
RDW: 12.9 % (ref 11.5–15.5)
WBC: 7.4 10*3/uL (ref 4.0–10.5)
nRBC: 0 % (ref 0.0–0.2)

## 2018-11-10 LAB — URINE DRUG SCREEN, QUALITATIVE (ARMC ONLY)
Amphetamines, Ur Screen: NOT DETECTED
Barbiturates, Ur Screen: NOT DETECTED
Benzodiazepine, Ur Scrn: POSITIVE — AB
Cannabinoid 50 Ng, Ur ~~LOC~~: POSITIVE — AB
Cocaine Metabolite,Ur ~~LOC~~: NOT DETECTED
MDMA (Ecstasy)Ur Screen: NOT DETECTED
Methadone Scn, Ur: NOT DETECTED
Opiate, Ur Screen: NOT DETECTED
Phencyclidine (PCP) Ur S: NOT DETECTED
Tricyclic, Ur Screen: NOT DETECTED

## 2018-11-10 LAB — CORTISOL: Cortisol, Plasma: 6.4 ug/dL

## 2018-11-10 LAB — TSH: TSH: 1.815 u[IU]/mL (ref 0.350–4.500)

## 2018-11-10 MED ORDER — DIVALPROEX SODIUM ER 500 MG PO TB24
1000.0000 mg | ORAL_TABLET | Freq: Every day | ORAL | Status: DC
  Administered 2018-11-10 (×2): 1000 mg via ORAL
  Filled 2018-11-10 (×3): qty 2

## 2018-11-10 MED ORDER — INFLUENZA VAC SPLIT QUAD 0.5 ML IM SUSY
0.5000 mL | PREFILLED_SYRINGE | INTRAMUSCULAR | Status: AC
  Administered 2018-11-11: 0.5 mL via INTRAMUSCULAR
  Filled 2018-11-10: qty 0.5

## 2018-11-10 MED ORDER — LAMOTRIGINE 100 MG PO TABS
100.0000 mg | ORAL_TABLET | Freq: Every day | ORAL | Status: DC
  Administered 2018-11-10 – 2018-11-11 (×2): 100 mg via ORAL
  Filled 2018-11-10 (×2): qty 1

## 2018-11-10 MED ORDER — ACETAMINOPHEN 650 MG RE SUPP: 650.0000 mg | Freq: Four times a day (QID) | RECTAL | Status: DC | PRN

## 2018-11-10 MED ORDER — OLANZAPINE 5 MG PO TABS
5.0000 mg | ORAL_TABLET | Freq: Every day | ORAL | Status: DC
  Administered 2018-11-10: 5 mg via ORAL
  Filled 2018-11-10 (×2): qty 1

## 2018-11-10 MED ORDER — ORAL CARE MOUTH RINSE: 15.0000 mL | Freq: Two times a day (BID) | OROMUCOSAL | Status: DC

## 2018-11-10 MED ORDER — RISPERIDONE 3 MG PO TABS
3.0000 mg | ORAL_TABLET | Freq: Every day | ORAL | Status: DC
  Administered 2018-11-10: 3 mg via ORAL
  Filled 2018-11-10 (×2): qty 1

## 2018-11-10 MED ORDER — ENOXAPARIN SODIUM 40 MG/0.4ML ~~LOC~~ SOLN
40.0000 mg | SUBCUTANEOUS | Status: DC
  Administered 2018-11-10: 40 mg via SUBCUTANEOUS
  Filled 2018-11-10: qty 0.4

## 2018-11-10 MED ORDER — CHLORHEXIDINE GLUCONATE 0.12 % MT SOLN
15.0000 mL | Freq: Two times a day (BID) | OROMUCOSAL | Status: DC
  Administered 2018-11-10 – 2018-11-11 (×3): 15 mL via OROMUCOSAL
  Filled 2018-11-10 (×3): qty 15

## 2018-11-10 MED ORDER — DIVALPROEX SODIUM ER 500 MG PO TB24: 500.0000 mg | ORAL_TABLET | Freq: Two times a day (BID) | ORAL | Status: DC

## 2018-11-10 MED ORDER — DIVALPROEX SODIUM ER 500 MG PO TB24
500.0000 mg | ORAL_TABLET | Freq: Every day | ORAL | Status: DC
  Administered 2018-11-10 – 2018-11-11 (×2): 500 mg via ORAL
  Filled 2018-11-10 (×3): qty 1

## 2018-11-10 MED ORDER — ONDANSETRON HCL 4 MG PO TABS: 4.0000 mg | ORAL_TABLET | Freq: Four times a day (QID) | ORAL | Status: DC | PRN

## 2018-11-10 MED ORDER — PROPRANOLOL HCL ER 60 MG PO CP24
60.0000 mg | ORAL_CAPSULE | Freq: Every day | ORAL | Status: DC
  Administered 2018-11-10 – 2018-11-11 (×2): 60 mg via ORAL
  Filled 2018-11-10 (×2): qty 1

## 2018-11-10 MED ORDER — SODIUM CHLORIDE 0.9% FLUSH
3.0000 mL | Freq: Two times a day (BID) | INTRAVENOUS | Status: DC
  Administered 2018-11-10 – 2018-11-11 (×3): 3 mL via INTRAVENOUS

## 2018-11-10 MED ORDER — ACETAMINOPHEN 325 MG PO TABS: 650.0000 mg | ORAL_TABLET | Freq: Four times a day (QID) | ORAL | Status: DC | PRN

## 2018-11-10 MED ORDER — ONDANSETRON HCL 4 MG/2ML IJ SOLN: 4.0000 mg | Freq: Four times a day (QID) | INTRAMUSCULAR | Status: DC | PRN

## 2018-11-10 NOTE — Consult Note (Addendum)
Reason for Consult:Seizures Referring Physician: Enid BaasKalisetti, Radhika  CC: Seizures  HPI: Daniel Craig is an 27 y.o. 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  Patient is very sleepy and unable to provide history so history mostly obtained from his chart.  Per ED reports EMS was called to patient's residence for seizure, on arrival initial blood glucose check was found to be less than 20 so dextrose 10 and glucagon was administered with improvement noted.  En route to the emergency department patient apparently had another witnessed seizure like activity lasting few seconds with no associated symptoms or loss of bowel or bladder control.  He received 2 mg of Versed.  Initial labs revealed a valproic acid levels of 100ug/mL, CK 170, glucose improved to 114, platelets decreased 117, calcium 8.7.  He was recently seen in the ED for similar episode on 11/04/2018 at that time he had CT head which was normal with no acute intracranial abnormality noted.   Past Medical History:  Diagnosis Date  . Schizophrenia (HCC)   . Seizures (HCC)     Past Surgical History:  Procedure Laterality Date  . MOUTH SURGERY      Family History  Family history unknown: Yes    Social History:  reports that he has been smoking cigarettes. He has been smoking about 1.00 pack per day. He has never used smokeless tobacco. He reports current alcohol use of about 4.0 standard drinks of alcohol per week. He reports current drug use. Drug: Marijuana.  Allergies  Allergen Reactions  . Keppra [Levetiracetam]   . Trileptal [Oxcarbazepine]   . Vimpat [Lacosamide]     Medications:  I have reviewed the patient's current  medications. Prior to Admission:  Medications Prior to Admission  Medication Sig Dispense Refill Last Dose  . divalproex (DEPAKOTE ER) 500 MG 24 hr tablet Take 500-1,000 mg by mouth See admin instructions. 500 mg every morning and 1000 mg at bedtime   unknown at unknown  . lamoTRIgine (LAMICTAL) 100 MG tablet Take 100-200 mg by mouth See admin instructions. 100 mg every morning and 200 mg at bedtime   unknown at unknown  . LORazepam (ATIVAN) 1 MG tablet Take 1 tablet (1 mg total) by mouth 2 (two) times daily as needed for seizure (or shaking). 10 tablet 0 unknown at unknown  . meloxicam (MOBIC) 15 MG tablet Take 15 mg by mouth daily.   unknown at unknown  . OLANZapine (ZYPREXA) 5 MG tablet Take 5 mg by mouth at bedtime.    unknown at unknown  . propranolol ER (INDERAL LA) 60 MG 24 hr capsule Take 60 mg by mouth daily.   unknown at unknown  . risperiDONE (RISPERDAL) 3 MG tablet Take 3 mg by mouth at bedtime.   unknown at unknown  . Vitamin D, Ergocalciferol, (DRISDOL) 50000 units CAPS capsule Take 50,000 Units by mouth every 7 (seven) days.   unknown at unknown   Scheduled: . chlorhexidine  15 mL Mouth Rinse BID  . divalproex  1,000 mg Oral QHS  . divalproex  500 mg Oral Daily  . enoxaparin (LOVENOX) injection  40 mg Subcutaneous Q24H  . [START ON 11/11/2018] Influenza vac split quadrivalent PF  0.5 mL Intramuscular Tomorrow-1000  . lamoTRIgine  100-200 mg  Oral Daily  . mouth rinse  15 mL Mouth Rinse q12n4p  . OLANZapine  5 mg Oral QHS  . propranolol ER  60 mg Oral Daily  . risperiDONE  3 mg Oral QHS  . sodium chloride flush  3 mL Intravenous Q12H    ROS: History obtained from the patient   General ROS: negative for - chills, fatigue, fever, night sweats, weight gain or weight loss Psychological ROS: Positive for - behavioral disorder, mood swings. Negative for suicidal ideation Ophthalmic ROS: negative for - blurry vision, double vision, eye pain or loss of vision ENT ROS: negative  for - epistaxis, nasal discharge, oral lesions, sore throat, tinnitus or vertigo Allergy and Immunology ROS: negative for - hives or itchy/watery eyes Hematological and Lymphatic ROS: negative for - bleeding problems, bruising or swollen lymph nodes Endocrine ROS: negative for - galactorrhea, hair pattern changes, polydipsia/polyuria or temperature intolerance Respiratory ROS: negative for - cough, hemoptysis, shortness of breath or wheezing Cardiovascular ROS: negative for - chest pain, dyspnea on exertion, edema or irregular heartbeat Gastrointestinal ROS: negative for - abdominal pain, diarrhea, hematemesis, nausea/vomiting or stool incontinence Genito-Urinary ROS: negative for - dysuria, hematuria, incontinence or urinary frequency/urgency Musculoskeletal ROS: negative for - joint swelling or muscular weakness Neurological ROS: as noted in HPI Dermatological ROS: negative for rash and skin lesion changes  Physical Examination: Blood pressure (!) 98/53, pulse 81, temperature 97.8 F (36.6 C), temperature source Oral, resp. rate 19, height 6' (1.829 m), weight 85 kg, SpO2 100 %.  HEENT-  Normocephalic, no lesions, without obvious abnormality.  Normal external eye and conjunctiva.  Normal TM's bilaterally.  Normal auditory canals and external ears. Normal external nose, mucus membranes and septum.  Normal pharynx. Cardiovascular- S1, S2 normal, pulses palpable throughout   Lungs- chest clear, no wheezing, rales, normal symmetric air entry Abdomen- soft, non-tender; bowel sounds normal; no masses,  no organomegaly Extremities- no edema Lymph-no adenopathy palpable Musculoskeletal-no joint tenderness, deformity or swelling Skin-warm and dry, no hyperpigmentation, vitiligo, or suspicious lesions  Neurological Exam   Mental Status: Alert, oriented, thought content appropriate.  Speech fluent without evidence of aphasia.  Able to follow 3 step commands without difficulty. Attention span and  concentration seemed appropriate  Cranial Nerves: II: Discs flat bilaterally; Visual fields grossly normal, pupils equal, round, reactive to light and accommodation III,IV, VI: ptosis not present, extra-ocular motions intact bilaterally V,VII: smile symmetric, facial light touch sensation intact VIII: hearing normal bilaterally IX,X: gag reflex present XI: bilateral shoulder shrug XII: midline tongue extension Motor: Right :  Upper extremity   5/5 Without pronator drift      Left: Upper extremity   5/5 without pronator drift Right:   Lower extremity   5/5                                          Left: Lower extremity   5/5 Tone and bulk:normal tone throughout; no atrophy noted Bilateral resting tremors of upper extremities L>R Sensory: Pinprick and light touch intact bilaterally Deep Tendon Reflexes: 2+ and symmetric throughout Plantars: Right: mute                              Left: mute Cerebellar: Finger-to-nose testing intact bilaterally. Heel to shin testing normal bilaterally Gait: not tested due to safety concerns  Data Reviewed  Laboratory Studies:   Basic Metabolic Panel: Recent Labs  Lab 11/04/18 1802 11/09/18 2129 11/10/18 0614  NA 138 141 141  K 4.0 3.4* 3.8  CL 105 108 110  CO2 25 24 24   GLUCOSE 97 80 97  BUN 8 11 9   CREATININE 0.96 1.12 0.92  CALCIUM 9.4 9.4 8.7*    Liver Function Tests: Recent Labs  Lab 11/04/18 1802 11/09/18 2129  AST 32 37  ALT 18 23  ALKPHOS 29* 28*  BILITOT 0.7 0.7  PROT 7.4 7.5  ALBUMIN 3.9 4.2   No results for input(s): LIPASE, AMYLASE in the last 168 hours. No results for input(s): AMMONIA in the last 168 hours.  CBC: Recent Labs  Lab 11/04/18 1802 11/09/18 2129 11/10/18 0614  WBC 5.5 5.8 7.4  NEUTROABS 2.0  --   --   HGB 14.1 14.8 13.4  HCT 40.7 43.2 39.8  MCV 92.5 93.3 95.2  PLT 213 172 117*    Cardiac Enzymes: Recent Labs  Lab 11/09/18 2129  CKTOTAL 170    BNP: Invalid input(s):  POCBNP  CBG: Recent Labs  Lab 11/10/18 0027 11/10/18 0101 11/10/18 0402 11/10/18 0742 11/10/18 1148  GLUCAP 96 99 91 96 100*    Microbiology: Results for orders placed or performed during the hospital encounter of 12/27/17  Urine culture     Status: Abnormal   Collection Time: 12/28/17 12:29 AM  Result Value Ref Range Status   Specimen Description   Final    URINE, RANDOM Performed at [facility], [address], [place], [place] [address]    Special Requests   Final    NONE Performed at [facility], [address], [place], [place] [address]    Culture (A)  Final    <10,000 COLONIES/mL INSIGNIFICANT GROWTH Performed at [facility], [address] [place], [place] [address]    Report Status 12/30/2017 FINAL  Final  Culture, blood (routine x 2)     Status: None   Collection Time: 12/28/17  4:21 AM  Result Value Ref Range Status   Specimen Description BLOOD LEFT FOREARM  Final   Special Requests   Final    BOTTLES DRAWN AEROBIC AND ANAEROBIC Blood Culture results may not be optimal due to an excessive volume of blood received in culture bottles   Culture   Final    NO GROWTH 5 DAYS Performed at [facility], [address] [place], [place] [address]    Report Status 01/02/2018 FINAL  Final  Culture, blood (routine x 2)     Status: None   Collection Time: 12/28/17  4:21 AM  Result Value Ref Range Status   Specimen Description BLOOD LEFT ANTECUBITAL  Final   Special Requests   Final    BOTTLES DRAWN AEROBIC AND ANAEROBIC Blood Culture results may not be optimal due to an excessive volume of blood received in culture bottles   Culture   Final    NO GROWTH 5 DAYS Performed at [facility], [address] [place], [place] [address]    Report Status 01/02/2018 FINAL  Final  Group A Strep by PCR     Status: None   Collection Time: 12/28/17  4:21 AM  Result Value Ref Range Status   Group A Strep by PCR NOT DETECTED NOT  DETECTED Final    Comment: Performed at [facility], [address] [place], [place] [address]    Coagulation Studies: No results for input(s): LABPROT,  INR in the last 72 hours.  Urinalysis:  Recent Labs  Lab 11/10/18 1016  COLORURINE YELLOW*  LABSPEC 1.029  PHURINE 6.0  GLUCOSEU NEGATIVE  HGBUR NEGATIVE  BILIRUBINUR NEGATIVE  KETONESUR 5*  PROTEINUR NEGATIVE  NITRITE NEGATIVE  LEUKOCYTESUR NEGATIVE    Lipid Panel:     Component Value Date/Time   CHOL 128 02/28/2018 0825   TRIG 83 02/28/2018 0825   HDL 47 02/28/2018 0825   CHOLHDL 2.7 02/28/2018 0825   VLDL 17 02/28/2018 0825   LDLCALC 64 02/28/2018 0825    HgbA1C:  Lab Results  Component Value Date   HGBA1C 5.0 02/28/2018    Urine Drug Screen:      Component Value Date/Time   LABOPIA NONE DETECTED 11/10/2018 1016   COCAINSCRNUR NONE DETECTED 11/10/2018 1016   LABBENZ POSITIVE (A) 11/10/2018 1016   AMPHETMU NONE DETECTED 11/10/2018 1016   THCU POSITIVE (A) 11/10/2018 1016   LABBARB NONE DETECTED 11/10/2018 1016    Alcohol Level:  Recent Labs  Lab 11/04/18 2012  ETH <10    Other results: EKG: normal EKG, normal sinus rhythm, unchanged from previous tracings.  Imaging: No results found.   Assessment: 27. 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  Etiology likely breakthrough seizure in the setting of hypoglycemia.  Blood glucose currently controlled with no new episodes of seizures or seizure-like activity.  Patient report has been taking his AEDs as prescribed but occasionally missed a few doses.  Currently on Lamictal 200 mg twice daily and Depakote 1500mg  daily.  Plan: 1.  Check Depakote  and Lamictal levels in am 2.  Seizure precautions 3.  Ativan prn seizure activity 4.  Continue Lamictal and Depakote 500 mg in am and 1000 mg at bedtime as scheduled. Will remain on this at discharge and follow with outpatient Neurology for management. 5. Patient unable to drive, operate heavy machinery, perform activities at heights and participate in water activities until release by outpatient physician.  This patient was staffed with Dr. Loretha Brasil, Doyle Askew who personally evaluated patient, reviewed documentation and agreed with assessment and plan of care as above.  Webb Silversmith, DNP, FNP-BC Board certified Nurse Practitioner Neurology Department   11/10/2018, 5:08 PM

## 2018-11-10 NOTE — Plan of Care (Signed)
°  Problem: Education: °Goal: Knowledge of General Education information will improve °Description: Including pain rating scale, medication(s)/side effects and non-pharmacologic comfort measures °Outcome: Progressing °  °Problem: Health Behavior/Discharge Planning: °Goal: Ability to manage health-related needs will improve °Outcome: Progressing °  °Problem: Clinical Measurements: °Goal: Ability to maintain clinical measurements within normal limits will improve °Outcome: Progressing °Goal: Will remain free from infection °Outcome: Progressing °Goal: Diagnostic test results will improve °Outcome: Progressing °  °Problem: Activity: °Goal: Risk for activity intolerance will decrease °Outcome: Progressing °  °Problem: Pain Managment: °Goal: General experience of comfort will improve °Outcome: Progressing °  °Problem: Safety: °Goal: Ability to remain free from injury will improve °Outcome: Progressing °  °

## 2018-11-10 NOTE — Progress Notes (Signed)
[facility] [place] [facility]   PATIENT NAME: Daniel Craig    MR#:  [id number]  DATE OF BIRTH:  02/05/1991  SUBJECTIVE:  CHIEF COMPLAINT:   Chief Complaint  Patient presents with  . Hypoglycemia   -Is very sleepy, but when aroused-he is alert and oriented. -No further seizure activity here.  Remains on D5 drip and sugars improved -Denies taking any diabetic medications or insulin at home.  REVIEW OF SYSTEMS:  Review of Systems  Constitutional: Positive for malaise/fatigue. Negative for chills and fever.  HENT: Negative for ear discharge, ear pain and hearing loss.   Respiratory: Negative for cough, shortness of breath and wheezing.   Cardiovascular: Negative for chest pain and palpitations.  Gastrointestinal: Negative for abdominal pain, constipation, diarrhea, nausea and vomiting.  Genitourinary: Negative for dysuria.  Neurological: Positive for seizures. Negative for dizziness and headaches.  Psychiatric/Behavioral: Negative for depression.    DRUG ALLERGIES:   Allergies  Allergen Reactions  . Keppra [Levetiracetam]   . Trileptal [Oxcarbazepine]   . Vimpat [Lacosamide]     VITALS:  Blood pressure (!) 98/53, pulse (!) 57, temperature 97.8 F (36.6 C), temperature source Oral, resp. rate 19, height 6' (1.829 m), weight 85 kg, SpO2 100 %.  PHYSICAL EXAMINATION:  Physical Exam  GENERAL:  27 y.o.-year-old patient lying in the bed with no acute distress.  EYES: Pupils equal, round, reactive to light and accommodation. No scleral icterus. Extraocular muscles intact.  Swollen eyelids bilateral HEENT: Head atraumatic, normocephalic. Oropharynx and nasopharynx clear.  NECK:  Supple, no jugular venous distention. No thyroid enlargement, no tenderness.  LUNGS: Normal breath sounds bilaterally, no wheezing, rales,rhonchi or crepitation. No use of accessory muscles of respiration.  CARDIOVASCULAR: S1, S2 normal. No murmurs, rubs, or gallops.  ABDOMEN:  Soft, nontender, nondistended. Bowel sounds present. No organomegaly or mass.  EXTREMITIES: No pedal edema, cyanosis, or clubbing.  NEUROLOGIC: Cranial nerves II through XII are intact. Muscle strength 5/5 in all extremities. Sensation intact. Gait not checked.  PSYCHIATRIC: The patient is alert and oriented x 3.  SKIN: No obvious rash, lesion, or ulcer.    LABORATORY PANEL:   CBC Recent Labs  Lab 11/10/18 0614  WBC 7.4  HGB 13.4  HCT 39.8  PLT 117*   ------------------------------------------------------------------------------------------------------------------  Chemistries  Recent Labs  Lab 11/09/18 2129 11/10/18 0614  NA 141 141  K 3.4* 3.8  CL 108 110  CO2 24 24  GLUCOSE 80 97  BUN 11 9  CREATININE 1.12 0.92  CALCIUM 9.4 8.7*  AST 37  --   ALT 23  --   ALKPHOS 28*  --   BILITOT 0.7  --    ------------------------------------------------------------------------------------------------------------------  Cardiac Enzymes No results for input(s): TROPONINI in the last 168 hours. ------------------------------------------------------------------------------------------------------------------  RADIOLOGY:  No results found.  EKG:   Orders placed or performed during the hospital encounter of 11/09/18  . EKG 12-Lead  . EKG 12-Lead  . EKG 12-Lead  . EKG 12-Lead    ASSESSMENT AND PLAN:   27 year old male with past medical history significant for schizophrenia, seizure disorder presents to hospital secondary to seizure  1.  Generalized tonic-clonic seizure-secondary to hypoglycemia lowering the seizure threshold. -Patient takes Lamictal and Depakote at home. -No further seizures here.  Sugars have improved on D5 drip -Neurology consult pending -Likely no changes to his medications -Urinalysis and drug screen are pending  2.  Hypoglycemia-unknown cause at this time.  Only poor oral intake reported. -Serum sulfonylurea screen  is pending.  Denies taking any  insulin. -C-peptide levels are pending as well. -Continue D5 drip for now  3.  Schizophrenia-stable.  Continue home medications.  Patient on Risperdal and Zyprexa. -Has an outpatient psychiatrist  4.  DVT prophylaxis-Lovenox  Independent and lives by himself     All the records are reviewed and case discussed with Care Management/Social Workerr. Management plans discussed with the patient, family and they are in agreement.  CODE STATUS: Full code  TOTAL TIME TAKING CARE OF THIS PATIENT: 37 minutes.   POSSIBLE D/C IN 2 DAYS, DEPENDING ON CLINICAL CONDITION.   Enid BaasKALISETTI,Jazz Biddy M.D on 11/10/2018 at 10:12 AM  Between 7am to 6pm - Pager - [phone]  After 6pm go to [email or web address] - password [email or web address]  [facility] [place] [facility]  Office  [phone]  CC: Primary care physician; System, Pcp Not In

## 2018-11-10 NOTE — Progress Notes (Signed)
Patient is lethargic.  Difficult to arouse and then quickly falls back to sleep.  Oriented to person and place.  Disoriented to year and situation.

## 2018-11-10 NOTE — Clinical Social Work Note (Signed)
Patient lives in his own apartment and has an ACT team who check on patient daily.  Patient state he is happy with his current living arrangement, patient stated that his ACT team help transport him to his appointments and makes sure he has his medications.  Patient could not remember the name of his ACT team worker or the phone number, CSW was given permission to contact his father Daniel Craig who is the legal guardian.  CSW contacted patient's father, he confirmed that patient has an ACT team and the worker's name is Daniel Craig, her phone number is [phone].  Patient's father stated that he spoke to [employer] and informed them that he is in the hospital.  CSW contacted [employer] and she said that once patient is ready for discharge to contact her and she will make arrangements to have him picked up.  CSW will continue to follow in case patient's needs change.  Ervin KnackEric R. Chiquetta Langner, MSW, Theresia MajorsLCSWA [phone]  11/10/2018 6:11 PM

## 2018-11-10 NOTE — ED Notes (Signed)
ED TO INPATIENT HANDOFF REPORT  Name/Age/Gender Daniel Craig 27 y.o. male  Code Status Code Status History    Date Active Date Inactive Code Status Order ID Comments User Context   02/27/2018 1550 02/28/2018 1837 Full Code [id number]  Audery Amellapacs, John T, MD Inpatient   12/28/2017 0751 12/29/2017 1735 Full Code [id number]  Ihor AustinPyreddy, Pavan, MD Inpatient   08/11/2017 2212 08/12/2017 1759 Full Code [id number]  Marguarite ArbourSparks, Jeffrey D, MD Inpatient      Home/SNF/Other home  Chief Complaint Hyperglycemia; Seizure  Level of Care/Admitting Diagnosis ED Disposition    ED Disposition Condition Comment   Admit  Hospital Area: [facility] [[id number]]  Level of Care: Med-Surg [16]  Diagnosis: Seizures (HCC) [[id number]][[id number]]  Admitting Physician: Oralia ManisWILLIS, DAVID [[id number]]  Attending Physician: Anne HahnWILLIS, DAVID [phone][[id number]]  Estimated length of stay: past midnight tomorrow  Certification:: I certify this patient will need inpatient services for at least 2 midnights  PT Class (Do Not Modify): Inpatient [101]  PT Acc Code (Do Not Modify): Private [1]       Medical History Past Medical History:  Diagnosis Date  . Schizophrenia (HCC)   . Seizures (HCC)     Allergies Allergies  Allergen Reactions  . Keppra [Levetiracetam]   . Trileptal [Oxcarbazepine]   . Vimpat [Lacosamide]     IV Location/Drains/Wounds Patient Lines/Drains/Airways Status   Active Line/Drains/Airways    Name:   Placement date:   Placement time:   Site:   Days:   Peripheral IV 11/09/18 Left Forearm   11/09/18    2119    Forearm   1          Labs/Imaging Results for orders placed or performed during the hospital encounter of 11/09/18 (from the past 48 hour(s))  Glucose, capillary     Status: Abnormal   Collection Time: 11/09/18  9:18 PM  Result Value Ref Range   Glucose-Capillary 114 (H) 70 - 99 mg/dL  CBC     Status: None   Collection Time: 11/09/18  9:29 PM  Result Value Ref Range   WBC 5.8 4.0 - 10.5 K/uL   RBC  4.63 4.22 - 5.81 MIL/uL   Hemoglobin 14.8 13.0 - 17.0 g/dL   HCT 66.443.2 40.339.0 - 47.452.0 %   MCV 93.3 80.0 - 100.0 fL   MCH 32.0 26.0 - 34.0 pg   MCHC 34.3 30.0 - 36.0 g/dL   RDW 25.912.8 56.311.5 - 87.515.5 %   Platelets 172 150 - 400 K/uL   nRBC 0.0 0.0 - 0.2 %    Comment: Performed at [facility], [address] [place], [place] [address]  Comprehensive metabolic panel     Status: Abnormal   Collection Time: 11/09/18  9:29 PM  Result Value Ref Range   Sodium 141 135 - 145 mmol/L   Potassium 3.4 (L) 3.5 - 5.1 mmol/L   Chloride 108 98 - 111 mmol/L   CO2 24 22 - 32 mmol/L   Glucose, Bld 80 70 - 99 mg/dL   BUN 11 6 - 20 mg/dL   Creatinine, Ser 9.511.12 0.61 - 1.24 mg/dL   Calcium 9.4 8.9 - 88.410.3 mg/dL   Total Protein 7.5 6.5 - 8.1 g/dL   Albumin 4.2 3.5 - 5.0 g/dL   AST 37 15 - 41 U/L   ALT 23 0 - 44 U/L   Alkaline Phosphatase 28 (L) 38 - 126 U/L   Total Bilirubin 0.7 0.3 - 1.2 mg/dL   GFR calc non Af Amer >  60 >60 mL/min   GFR calc Af Amer >60 >60 mL/min   Anion gap 9 5 - 15    Comment: Performed at [facility], [address] [place], [place] [address]  CK     Status: None   Collection Time: 11/09/18  9:29 PM  Result Value Ref Range   Total CK 170 49 - 397 U/L    Comment: Performed at [facility], [address] [place], [place] [address]  Valproic acid level     Status: None   Collection Time: 11/09/18  9:29 PM  Result Value Ref Range   Valproic Acid Lvl 100 50.0 - 100.0 ug/mL    Comment: Performed at [facility] [address] [place], [place] [address]  Glucose, capillary     Status: Abnormal   Collection Time: 11/09/18  9:46 PM  Result Value Ref Range   Glucose-Capillary 42 (LL) 70 - 99 mg/dL  Glucose, capillary     Status: None   Collection Time: 11/09/18 10:24 PM  Result Value Ref Range   Glucose-Capillary 70 70 - 99 mg/dL  Glucose, capillary     Status: None   Collection Time: 11/09/18 10:43 PM  Result Value Ref Range    Glucose-Capillary 91 70 - 99 mg/dL  Glucose, capillary     Status: None   Collection Time: 11/10/18 12:27 AM  Result Value Ref Range   Glucose-Capillary 96 70 - 99 mg/dL   No results found.  Pending Labs Unresulted Labs (From admission, onward)    Start     Ordered   11/09/18 2217  Urinalysis, Complete w Microscopic  Once,   STAT     11/09/18 2216   11/09/18 2205  Insulin and C-Peptide  Once,   STAT     11/09/18 2204   Signed and Held  HIV antibody (Routine Testing)  Once,   R     Signed and Held   Signed and Held  CBC  (enoxaparin (LOVENOX)    CrCl >/= 30 ml/min)  Once,   R    Comments:  Baseline for enoxaparin therapy IF NOT ALREADY DRAWN.  Notify MD if PLT < 100 K.    Signed and Held   Signed and Held  Creatinine, serum  (enoxaparin (LOVENOX)    CrCl >/= 30 ml/min)  Once,   R    Comments:  Baseline for enoxaparin therapy IF NOT ALREADY DRAWN.    Signed and Held   Signed and Held  Creatinine, serum  (enoxaparin (LOVENOX)    CrCl >/= 30 ml/min)  Weekly,   R    Comments:  while on enoxaparin therapy    Signed and Held   Signed and Held  Basic metabolic panel  Tomorrow morning,   R     Signed and Held   Signed and Held  CBC  Tomorrow morning,   R     Signed and Held          Vitals/Pain Today's Vitals   11/09/18 2315 11/09/18 2330 11/09/18 2345 11/10/18 0000  BP: (!) 100/53 (!) 103/51 107/63 (!) 99/56  Pulse:      Resp: 16 17 19 20   Temp:      TempSrc:      SpO2:      Weight:      Height:      PainSc:        Isolation Precautions No active isolations  Medications Medications  dextrose 5 %-0.45 % sodium  chloride infusion ( Intravenous New Bag/Given 11/09/18 2221)  valproate (DEPACON) 500 mg in dextrose 5 % 50 mL IVPB (has no administration in time range)  dextrose 50 % solution 25 mL (25 mLs Intravenous Given 11/09/18 2150)    Mobility

## 2018-11-11 ENCOUNTER — Inpatient Hospital Stay: Payer: Medicaid Other

## 2018-11-11 LAB — BASIC METABOLIC PANEL
Anion gap: 6 (ref 5–15)
BUN: 6 mg/dL (ref 6–20)
CO2: 24 mmol/L (ref 22–32)
Calcium: 8.6 mg/dL — ABNORMAL LOW (ref 8.9–10.3)
Chloride: 108 mmol/L (ref 98–111)
Creatinine, Ser: 0.82 mg/dL (ref 0.61–1.24)
GFR calc Af Amer: >60 mL/min (ref >60)
GFR calc non Af Amer: >60 mL/min (ref >60)
Glucose, Bld: 82 mg/dL (ref 70–99)
Potassium: 3.6 mmol/L (ref 3.5–5.1)
Sodium: 138 mmol/L (ref 135–145)

## 2018-11-11 LAB — GLUCOSE, CAPILLARY
GLUCOSE-CAPILLARY: 90 mg/dL (ref 70–99)
Glucose-Capillary: 82 mg/dL (ref 70–99)
Glucose-Capillary: 87 mg/dL (ref 70–99)
Glucose-Capillary: 89 mg/dL (ref 70–99)

## 2018-11-11 LAB — HIV ANTIBODY (ROUTINE TESTING W REFLEX): HIV SCREEN 4TH GENERATION: NONREACTIVE

## 2018-11-11 LAB — INSULIN AND C-PEPTIDE, SERUM
C-Peptide: 8.7 ng/mL — ABNORMAL HIGH (ref 1.1–4.4)
Insulin: 46.9 u[IU]/mL — ABNORMAL HIGH (ref 2.6–24.9)

## 2018-11-11 LAB — BETA-HYDROXYBUTYRIC ACID: Beta-Hydroxybutyric Acid: 0.12 mmol/L (ref 0.05–0.27)

## 2018-11-11 MED ORDER — IOPAMIDOL (ISOVUE-300) INJECTION 61%
100.0000 mL | Freq: Once | INTRAVENOUS | Status: AC | PRN
  Administered 2018-11-11: 100 mL via INTRAVENOUS

## 2018-11-11 MED ORDER — MELOXICAM 15 MG PO TABS: 15.0000 mg | ORAL_TABLET | Freq: Every day | ORAL | Status: DC | PRN

## 2018-11-11 NOTE — Discharge Summary (Signed)
[facility] [place] [facility]   PATIENT NAME: Daniel Craig    MR#:  [id number]  DATE OF BIRTH:  1991/10/23  DATE OF ADMISSION:  11/09/2018   ADMITTING PHYSICIAN: Oralia Manis, MD  DATE OF DISCHARGE: 11/11/18  PRIMARY CARE PHYSICIAN: System, Pcp Not In   ADMISSION DIAGNOSIS:   Seizure (HCC) [R56.9] Hypoglycemia [E16.2]  DISCHARGE DIAGNOSIS:   Principal Problem:   Seizures (HCC) Active Problems:   Schizophrenia (HCC)   Hypoglycemia   SECONDARY DIAGNOSIS:   Past Medical History:  Diagnosis Date  . Schizophrenia (HCC)   . Seizures [facility])     HOSPITAL COURSE:   27 year old male with past medical history significant for schizophrenia, seizure disorder presents to hospital secondary to seizure  1.  Generalized tonic-clonic seizure-secondary to hypoglycemia lowering the seizure threshold. -Patient takes Lamictal and Depakote at home. -No further seizures here.    Appreciate neurology consult. -Patient will be discharged on his home doses of antiepileptics.  2.  Hypoglycemia-likely poor oral intake for greater than 3 days according to patient. -Was started on D5 with appropriate response.  Insulin and C-peptide levels are elevated however they were drawn after starting the D5 drip. -Proinsulin, cortisol beta hydroxybutyrate acid levels are pending. -Sulfonylurea screen is pending.  Patient not on insulin or sulfonylureas at home. -Off the drip and able to maintain his blood sugars.  Repeat episodes happen, will need endocrinology follow-up. -CT of the abdomen looking for any pancreatic masses is negative for any abnormalities.  3.  Schizophrenia-stable.  Continue home medications.  Patient on Risperdal and Zyprexa. -Has an outpatient psychiatrist  Independent and lives by himself His ACT team will be notified at discharge   DISCHARGE CONDITIONS:   Guarded  CONSULTS OBTAINED:   Treatment Team:  Pauletta Browns, MD  DRUG  ALLERGIES:   Allergies  Allergen Reactions  . Keppra [Levetiracetam]   . Trileptal [Oxcarbazepine]   . Vimpat [Lacosamide]    DISCHARGE MEDICATIONS:   Allergies as of 11/11/2018      Reactions   Keppra [levetiracetam]    Trileptal [oxcarbazepine]    Vimpat [lacosamide]       Medication List    TAKE these medications   divalproex 500 MG 24 hr tablet Commonly known as:  DEPAKOTE ER Take 500-1,000 mg by mouth See admin instructions. 500 mg every morning and 1000 mg at bedtime   lamoTRIgine 100 MG tablet Commonly known as:  LAMICTAL Take 100-200 mg by mouth See admin instructions. 100 mg every morning and 200 mg at bedtime   LORazepam 1 MG tablet Commonly known as:  ATIVAN Take 1 tablet (1 mg total) by mouth 2 (two) times daily as needed for seizure (or shaking).   meloxicam 15 MG tablet Commonly known as:  MOBIC Take 1 tablet (15 mg total) by mouth daily as needed for pain. What changed:    when to take this  reasons to take this   OLANZapine 5 MG tablet Commonly known as:  ZYPREXA Take 5 mg by mouth at bedtime.   propranolol ER 60 MG 24 hr capsule Commonly known as:  INDERAL LA Take 60 mg by mouth daily.   risperiDONE 3 MG tablet Commonly known as:  RISPERDAL Take 3 mg by mouth at bedtime.   Vitamin D (Ergocalciferol) 1.25 MG (50000 UT) Caps capsule Commonly known as:  DRISDOL Take 50,000 Units by mouth every 7 (seven) days.        DISCHARGE INSTRUCTIONS:   1.  PCP follow-up in 1 to 2 weeks 2.  Neurology follow-up in 2 to 3 weeks  DIET:   Regular diet  ACTIVITY:   Activity as tolerated  OXYGEN:   Home Oxygen: No.  Oxygen Delivery: room air  DISCHARGE LOCATION:   home   If you experience worsening of your admission symptoms, develop shortness of breath, life threatening emergency, suicidal or homicidal thoughts you must seek medical attention immediately by calling 911 or calling your MD immediately  if symptoms less severe.  You Must  read complete instructions/literature along with all the possible adverse reactions/side effects for all the Medicines you take and that have been prescribed to you. Take any new Medicines after you have completely understood and accpet all the possible adverse reactions/side effects.   Please note  You were cared for by a hospitalist during your hospital stay. If you have any questions about your discharge medications or the care you received while you were in the hospital after you are discharged, you can call the unit and asked to speak with the hospitalist on call if the hospitalist that took care of you is not available. Once you are discharged, your primary care physician will handle any further medical issues. Please note that NO REFILLS for any discharge medications will be authorized once you are discharged, as it is imperative that you return to your primary care physician (or establish a relationship with a primary care physician if you do not have one) for your aftercare needs so that they can reassess your need for medications and monitor your lab values.    On the day of Discharge:  VITAL SIGNS:   Blood pressure 110/68, pulse 64, temperature 97.6 F (36.4 C), temperature source Oral, resp. rate 18, height 6' (1.829 m), weight 95.6 kg, SpO2 100 %.  PHYSICAL EXAMINATION:   GENERAL:  27 y.o.-year-old patient lying in the bed with no acute distress.  EYES: Pupils equal, round, reactive to light and accommodation. No scleral icterus. Extraocular muscles intact.    HEENT: Head atraumatic, normocephalic. Oropharynx and nasopharynx clear.  NECK:  Supple, no jugular venous distention. No thyroid enlargement, no tenderness.  LUNGS: Normal breath sounds bilaterally, no wheezing, rales,rhonchi or crepitation. No use of accessory muscles of respiration.  CARDIOVASCULAR: S1, S2 normal. No murmurs, rubs, or gallops.  ABDOMEN: Soft, nontender, nondistended. Bowel sounds present. No organomegaly  or mass.  EXTREMITIES: No pedal edema, cyanosis, or clubbing.  NEUROLOGIC: Cranial nerves II through XII are intact. Muscle strength 5/5 in all extremities. Sensation intact. Gait not checked.  PSYCHIATRIC: The patient is alert and oriented x 3.  SKIN: No obvious rash, lesion, or ulcer   DATA REVIEW:   CBC Recent Labs  Lab 11/10/18 0614  WBC 7.4  HGB 13.4  HCT 39.8  PLT 117*    Chemistries  Recent Labs  Lab 11/09/18 2129  11/11/18 0426  NA 141   < > 138  K 3.4*   < > 3.6  CL 108   < > 108  CO2 24   < > 24  GLUCOSE 80   < > 82  BUN 11   < > 6  CREATININE 1.12   < > 0.82  CALCIUM 9.4   < > 8.6*  AST 37  --   --   ALT 23  --   --   ALKPHOS 28*  --   --   BILITOT 0.7  --   --    < > =  values in this interval not displayed.     Microbiology Results  Results for orders placed or performed during the hospital encounter of 12/27/17  Urine culture     Status: Abnormal   Collection Time: 12/28/17 12:29 AM  Result Value Ref Range Status   Specimen Description   Final    URINE, RANDOM Performed at [facility], [address] [place], [place] [address]    Special Requests   Final    NONE Performed at [facility], [address] [place], [place] [address]    Culture (A)  Final    <10,000 COLONIES/mL INSIGNIFICANT GROWTH Performed at [facility], [address] [place], [place] [address]    Report Status 12/30/2017 FINAL  Final  Culture, blood (routine x 2)     Status: None   Collection Time: 12/28/17  4:21 AM  Result Value Ref Range Status   Specimen Description BLOOD LEFT FOREARM  Final   Special Requests   Final    BOTTLES DRAWN AEROBIC AND ANAEROBIC Blood Culture results may not be optimal due to an excessive volume of blood received in culture bottles   Culture   Final    NO GROWTH 5 DAYS Performed at [facility], [address] [place], [place] [address]    Report Status 01/02/2018 FINAL  Final  Culture, blood  (routine x 2)     Status: None   Collection Time: 12/28/17  4:21 AM  Result Value Ref Range Status   Specimen Description BLOOD LEFT ANTECUBITAL  Final   Special Requests   Final    BOTTLES DRAWN AEROBIC AND ANAEROBIC Blood Culture results may not be optimal due to an excessive volume of blood received in culture bottles   Culture   Final    NO GROWTH 5 DAYS Performed at [facility], [address] [place], [place] [address]    Report Status 01/02/2018 FINAL  Final  Group A Strep by PCR     Status: None   Collection Time: 12/28/17  4:21 AM  Result Value Ref Range Status   Group A Strep by PCR NOT DETECTED NOT DETECTED Final    Comment: Performed at [facility], [address] [place], [place] [address]    RADIOLOGY:  Ct Abdomen W Wo Contrast  Result Date: 11/11/2018 CLINICAL DATA:  Hypoglycemia of unclear etiology. EXAM: CT ABDOMEN WITHOUT AND WITH CONTRAST TECHNIQUE: Multidetector CT imaging of the abdomen was performed following the standard protocol before and following the bolus administration of intravenous contrast. CONTRAST:  100mL ISOVUE-300 IOPAMIDOL (ISOVUE-300) INJECTION 61% COMPARISON:  None. FINDINGS: Lower chest:  Lung bases are clear. Hepatobiliary: No focal hepatic lesion. Gallbladder normal. No biliary duct dilatation. Pancreas: No enhancing pancreatic lesion. No pancreatic duct dilatation. No inflammation. Spleen: Normal spleen. Stomach/bowel: No gastric lesion. Limited view of the small bowel and colon unremarkable. Adrenals/urinary tract: Adrenal glands and kidneys are normal. Musculoskeletal: No aggressive osseous lesion IMPRESSION: *Normal pancreas.  No gastric lesion identified. *Normal abdominal CT Electronically Signed   By: Genevive BiStewart  Edmunds M.D.   On: 11/11/2018 14:30     Management plans discussed with the patient, family and they are in agreement.  CODE STATUS:     Code Status Orders  (From admission, onward)         Start      Ordered   11/10/18 0107  Full code  Continuous     11/10/18 0106  Code Status History    Date Active Date Inactive Code Status Order ID Comments User Context   02/27/2018 1550 02/28/2018 1837 Full Code [id number]  Audery Amel, MD Inpatient   12/28/2017 0751 12/29/2017 1735 Full Code [id number]  Ihor Austin, MD Inpatient   08/11/2017 2212 08/12/2017 1759 Full Code [id number]  Marguarite Arbour, MD Inpatient      TOTAL TIME TAKING CARE OF THIS PATIENT: 38 minutes.    Enid Baas M.D on 11/11/2018 at 3:19 PM  Between 7am to 6pm - Pager - [phone]  After 6pm go to [email or web address] - Social research officer, government  [facility]  Office  [phone]  CC: Primary care physician; System, Pcp Not In   Note: This dictation was prepared with Dragon dictation along with smaller phrase technology. Any transcriptional errors that result from this process are unintentional.

## 2018-11-11 NOTE — Progress Notes (Signed)
Discharged to home with caregiver.

## 2018-11-11 NOTE — Clinical Social Work Note (Signed)
CSW spoke with Bing QuarryBarbera [phone] with patient's act team, to inform them that patient is ready for discharge today.  Bing QuarryBarbera said she will contact the ACT team to arrange a ride for patient.  CSW updated bedside nurse, who said patient has called someone for a ride.  CSW to sign off, please reconsult if social work needs arise.  Ervin KnackEric R. Hassan Rowannterhaus, MSW, Theresia MajorsLCSWA [phone]  11/11/2018 3:38 PM

## 2018-11-16 LAB — PROINSULIN/INSULIN RATIO
Insulin: 19 u[IU]/mL — ABNORMAL HIGH
Proinsulin/Insulin Ratio: 40 %
Proinsulin: 51 pmol/L

## 2018-11-17 LAB — INSULIN ANTIBODIES, BLOOD: Insulin Antibodies, Human: <5 uU/mL

## 2018-11-21 LAB — SULFONYLUREA HYPOGLYCEMICS PANEL, SERUM
Acetohexamide: NEGATIVE ug/mL (ref 20–60)
Chlorpropamide: NEGATIVE ug/mL (ref 75–250)
Glimepiride: NEGATIVE ng/mL (ref 80–250)
Glipizide: NEGATIVE ng/mL (ref 200–1000)
Glyburide: NEGATIVE ng/mL
NATEGLINIDE: NEGATIVE ng/mL
REPAGLINIDE: NEGATIVE ng/mL
Tolazamide: NEGATIVE ug/mL
Tolbutamide: NEGATIVE ug/mL (ref 40–100)

## 2018-11-23 ENCOUNTER — Emergency Department
Admission: EM | Admit: 2018-11-23 | Discharge: 2018-11-23 | Disposition: A | Discharge status: Nursing Home (Includes ICF) | Payer: Medicaid Other | Attending: Emergency Medicine | Admitting: Emergency Medicine

## 2018-11-23 ENCOUNTER — Encounter: Payer: Self-pay | Admitting: Medical Oncology

## 2018-11-23 DIAGNOSIS — Z79899 Other long term (current) drug therapy: Secondary | ICD-10-CM | POA: Diagnosis not present

## 2018-11-23 DIAGNOSIS — F17210 Nicotine dependence, cigarettes, uncomplicated: Secondary | ICD-10-CM | POA: Insufficient documentation

## 2018-11-23 DIAGNOSIS — R252 Cramp and spasm: Secondary | ICD-10-CM | POA: Diagnosis not present

## 2018-11-23 LAB — BASIC METABOLIC PANEL
Anion gap: 8 (ref 5–15)
BUN: 9 mg/dL (ref 6–20)
CHLORIDE: 103 mmol/L (ref 98–111)
CO2: 26 mmol/L (ref 22–32)
Calcium: 9.8 mg/dL (ref 8.9–10.3)
Creatinine, Ser: 0.84 mg/dL (ref 0.61–1.24)
GFR calc Af Amer: >60 mL/min (ref >60)
GFR calc non Af Amer: >60 mL/min (ref >60)
Glucose, Bld: 75 mg/dL (ref 70–99)
Potassium: 3.6 mmol/L (ref 3.5–5.1)
Sodium: 137 mmol/L (ref 135–145)

## 2018-11-23 LAB — CBC
HEMATOCRIT: 45.9 % (ref 39.0–52.0)
Hemoglobin: 15.3 g/dL (ref 13.0–17.0)
MCH: 31.3 pg (ref 26.0–34.0)
MCHC: 33.3 g/dL (ref 30.0–36.0)
MCV: 93.9 fL (ref 80.0–100.0)
PLATELETS: 162 10*3/uL (ref 150–400)
RBC: 4.89 MIL/uL (ref 4.22–5.81)
RDW: 12.5 % (ref 11.5–15.5)
WBC: 6.7 10*3/uL (ref 4.0–10.5)
nRBC: 0 % (ref 0.0–0.2)

## 2018-11-23 LAB — GLUCOSE, CAPILLARY: Glucose-Capillary: 78 mg/dL (ref 70–99)

## 2018-11-23 NOTE — ED Notes (Signed)
Peripheral IV discontinued. Catheter intact. No signs of infiltration or redness. Gauze applied to IV site.   Discharge instructions reviewed with patient. Questions fielded by this RN. Patient verbalizes understanding of instructions. Patient discharged home in stable condition per goodman. No acute distress noted at time of discharge.   facility manager refused to come to back to sign, handoff care report given to legal guardian, Production designer, theatre/television/filmmanager and pt

## 2018-11-23 NOTE — ED Triage Notes (Signed)
Pt from home via ems with reports that pt has missed last nights dose and this am's dose of risperadol and depakote. Pts family reported that pt began having muscle rigidity and spasms. Pt remembers episode and denies seizure. 50mg  IV benadryl given pta. Pt A/O x 4, reports muscle soreness.

## 2018-11-23 NOTE — ED Notes (Signed)
Pt leaving with facility manager att

## 2018-11-23 NOTE — Discharge Instructions (Addendum)
You can try taking a benadryl if similar symptoms occur again. Please talk to your prescribing doctor about your medications. Please seek medical attention for any high fevers, chest pain, shortness of breath, change in behavior, persistent vomiting, bloody stool or any other new or concerning symptoms.

## 2018-11-23 NOTE — ED Notes (Addendum)
Father, Smiley HousemanRonnie Yam, legal guardian, notified of pt status and pending DC  Pt has already contacted group home for pick up, waiting for ride to arrive

## 2018-11-23 NOTE — ED Provider Notes (Signed)
[facility] Provider Note   ____________________________________________   I have reviewed the triage vital signs and the nursing notes.   HISTORY  Chief Complaint Spasms   History limited by: Not Limited   HPI Daniel [facility] is a 27 y.o. male who presents to the emergency department today because of concerns for shaking and muscle tightness.  The patient states this started today while he was sitting on the couch.  He states that he felt his muscles very tight and he was having uncontrollable shaking.  He did not lose consciousness and does not feel like he had a seizure.  He states similar symptoms happened roughly 2 weeks ago when he was found to be hypoglycemic.  Patient states that he had not eaten anything today.  In addition the patient missed his medications last night and this morning.  Patient denies any recent fevers.  Denies any chest pain or headache.  Patient was given 50 mg of Benadryl by EMS and states that he does feel better after receiving that medication.  Per medical record review patient has a history of seizures, recent admission for hypoglycemia.   Past Medical History:  Diagnosis Date  . Schizophrenia (HCC)   . Seizures [facility](HCC)     Patient Active Problem List   Diagnosis Date Noted  . Hypoglycemia 11/09/2018  . Schizophrenia, paranoid (HCC) 02/27/2018  . Cannabis use disorder, moderate, dependence (HCC) 02/27/2018  . Tobacco use disorder 02/27/2018  . 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 Altered mental status 12/28/2017  . Fever 08/11/2017  . Acute URI 08/11/2017  . Delirium 06/25/2017  . Schizophrenia (HCC) 06/25/2017  . Seizures (HCC) 06/25/2017    Past Surgical History:  Procedure Laterality Date  . MOUTH SURGERY      Prior to Admission medications   Medication Sig Start Date End Date Taking? Authorizing Provider  divalproex (DEPAKOTE ER) 500 MG 24 hr tablet Take 500-1,000 mg by mouth See admin  instructions. 500 mg every morning and 1000 mg at bedtime    [provider]  lamoTRIgine (LAMICTAL) 100 MG tablet Take 100-200 mg by mouth See admin instructions. 100 mg every morning and 200 mg at bedtime    [provider]  LORazepam (ATIVAN) 1 MG tablet Take 1 tablet (1 mg total) by mouth 2 (two) times daily as needed for seizure (or shaking). 01/06/17   Jennye MoccasinQuigley, Brian S, MD  meloxicam (MOBIC) 15 MG tablet Take 1 tablet (15 mg total) by mouth daily as needed for pain. 11/11/18   Enid BaasKalisetti, Radhika, MD  OLANZapine (ZYPREXA) 5 MG tablet Take 5 mg by mouth at bedtime.     [provider]  propranolol ER (INDERAL LA) 60 MG 24 hr capsule Take 60 mg by mouth daily.    [provider]  risperiDONE (RISPERDAL) 3 MG tablet Take 3 mg by mouth at bedtime.    [provider]  Vitamin D, Ergocalciferol, (DRISDOL) 50000 units CAPS capsule Take 50,000 Units by mouth every 7 (seven) days.    [provider]    Allergies Keppra [levetiracetam]; Trileptal [oxcarbazepine]; and Vimpat [lacosamide]  Family History  Family history unknown: Yes    Social History Social History   Tobacco Use  . Smoking status: Current Every Day Smoker    Packs/day: 1.00    Types: Cigarettes  . Smokeless tobacco: Never Used  Substance Use Topics  . Alcohol use: Yes    Alcohol/week: 4.0 standard drinks    Types: 4 Shots  of liquor per week    Comment: pt states 4 shots of liqour oer week  . Drug use: Yes    Types: Marijuana    Review of Systems Constitutional: No fever/chills Eyes: No visual changes. ENT: No sore throat. Cardiovascular: Denies chest pain. Respiratory: Denies shortness of breath. Gastrointestinal: No abdominal pain.  No nausea, no vomiting.  No diarrhea.   Genitourinary: Negative for dysuria. Musculoskeletal: Negative for back pain. Skin: Negative for rash. Neurological: Positive for muscle tightness and shaking.   ____________________________________________   PHYSICAL EXAM:  VITAL SIGNS: ED Triage Vitals  Enc Vitals Group     BP 11/23/18 1532 120/67     Pulse Rate 11/23/18 1532 60     Resp 11/23/18 1532 16     Temp 11/23/18 1532 (!) 97.5 F (36.4 C)     Temp Source 11/23/18 1532 Oral     SpO2 11/23/18 1532 97 %     Weight 11/23/18 1533 209 lb 7 oz (95 kg)     Height 11/23/18 1533 6' (1.829 m)     Head Circumference --      Peak Flow --      Pain Score 11/23/18 1532 6   Constitutional: Alert and oriented.  Eyes: Conjunctivae are normal.  ENT      Head: Normocephalic and atraumatic.      Nose: No congestion/rhinnorhea.      Mouth/Throat: Mucous membranes are moist.      Neck: No stridor. Hematological/Lymphatic/Immunilogical: No cervical lymphadenopathy. Cardiovascular: Normal rate, regular rhythm.  No murmurs, rubs, or gallops.  Respiratory: Normal respiratory effort without tachypnea nor retractions. Breath sounds are clear and equal bilaterally. No wheezes/rales/rhonchi. Gastrointestinal: Soft and non tender. No rebound. No guarding.  Genitourinary: Deferred Musculoskeletal: Normal range of motion in all extremities. No lower extremity edema. Neurologic:  Normal speech and language. No gross focal neurologic deficits are appreciated.  Skin:  Skin is warm, dry and intact. No rash noted. Psychiatric: Mood and affect are normal. Speech and behavior are normal. Patient exhibits appropriate insight and judgment.  ____________________________________________    LABS (pertinent positives/negatives)  BMP wnl CBC wnl  ____________________________________________   EKG  None  ____________________________________________    RADIOLOGY  None  ____________________________________________   PROCEDURES  Procedures  ____________________________________________   INITIAL IMPRESSION / ASSESSMENT AND PLAN / ED COURSE  Pertinent labs & imaging results that were available  during my care of the patient were reviewed by me and considered in my medical decision making (see chart for details).   Presented to the emergency department today because of concerns for muscle cramping and some spasms.  Patient denies any seizures.  On exam here patient no acute distress.  Patient was given bed room by EMS which did appear to help the patient's symptoms.  At this point I do wonder if patient was having some dystonic reaction secondary to medication.  Patient did have blood work checked without any concerning findings.  Patient was observed in the emergency department without any recurrence of symptoms.  Did discuss with patient that he could try Benadryl for the symptoms occurred again. Discussed importance of follow up with his physician   ____________________________________________   FINAL CLINICAL IMPRESSION(S) / ED DIAGNOSES  Final diagnoses:  Muscle cramping     Note: This dictation was prepared with Dragon dictation. Any transcriptional errors that result from this process are unintentional     Phineas SemenGoodman, Tyannah Sane, MD 11/23/18 1911

## 2018-11-28 ENCOUNTER — Other Ambulatory Visit: Payer: Self-pay

## 2018-11-28 ENCOUNTER — Emergency Department
Admission: EM | Admit: 2018-11-28 | Discharge: 2018-11-28 | Disposition: A | Discharge status: Home / Self Care | Payer: Medicaid Other | Attending: Student in an Organized Health Care Education/Training Program | Admitting: Student in an Organized Health Care Education/Training Program

## 2018-11-28 ENCOUNTER — Emergency Department: Payer: Medicaid Other

## 2018-11-28 DIAGNOSIS — Y929 Unspecified place or not applicable: Secondary | ICD-10-CM | POA: Insufficient documentation

## 2018-11-28 DIAGNOSIS — S99911A Unspecified injury of right ankle, initial encounter: Secondary | ICD-10-CM | POA: Diagnosis present

## 2018-11-28 DIAGNOSIS — Y998 Other external cause status: Secondary | ICD-10-CM | POA: Insufficient documentation

## 2018-11-28 DIAGNOSIS — Y9389 Activity, other specified: Secondary | ICD-10-CM | POA: Insufficient documentation

## 2018-11-28 DIAGNOSIS — Z79899 Other long term (current) drug therapy: Secondary | ICD-10-CM | POA: Diagnosis not present

## 2018-11-28 DIAGNOSIS — F1210 Cannabis abuse, uncomplicated: Secondary | ICD-10-CM | POA: Insufficient documentation

## 2018-11-28 DIAGNOSIS — F17210 Nicotine dependence, cigarettes, uncomplicated: Secondary | ICD-10-CM | POA: Diagnosis not present

## 2018-11-28 DIAGNOSIS — W19XXXA Unspecified fall, initial encounter: Secondary | ICD-10-CM | POA: Diagnosis not present

## 2018-11-28 DIAGNOSIS — S82831A Other fracture of upper and lower end of right fibula, initial encounter for closed fracture: Secondary | ICD-10-CM | POA: Insufficient documentation

## 2018-11-28 MED ORDER — ACETAMINOPHEN 500 MG PO TABS
1000.0000 mg | ORAL_TABLET | Freq: Once | ORAL | Status: AC
  Administered 2018-11-28: 1000 mg via ORAL
  Filled 2018-11-28: qty 2

## 2018-11-28 NOTE — ED Notes (Signed)
Called father and legal guardian, Delmer Rossey 2 with no answer. Left two messages.

## 2018-11-28 NOTE — ED Notes (Signed)
Splint applied by EDP.  

## 2018-11-28 NOTE — ED Notes (Addendum)
Awaiting caregiver before discharging patient. [facility] with [employer] took patient home.

## 2018-11-28 NOTE — ED Notes (Signed)
Father, Daniel Craig called for consent to treatment. Gives consent.

## 2018-11-28 NOTE — ED Triage Notes (Signed)
Right ankle pain since falling last night. Ambulates with limp. Pt alert and oriented X4, active, cooperative, pt in NAD. RR even and unlabored, color WNL.

## 2018-11-28 NOTE — ED Notes (Signed)
Pt alert and oriented X4, active, cooperative, pt in NAD. RR even and unlabored, color WNL.  Pt informed to return if any life threatening symptoms occur.  Discharge and followup instructions reviewed. Ambulates safely. 

## 2018-11-28 NOTE — ED Provider Notes (Addendum)
[facility] Emergency Department Provider Note    First MD Initiated Contact with Patient 11/28/18 1253     (approximate)  I have reviewed the triage vital signs and the nursing notes.   HISTORY  Chief Complaint Ankle Pain    HPI [facility] is a 28 y.o. male below listed past medical history presents with pain of the right ankle.  Patient had a mechanical fall last night.  States he was laying on his right leg and had tingling sensation in his leg like it was asleep.  Stood up quickly and rolled his ankle.  Did not hit his head.  Any other pain or discomfort.  Did not take anything for the pain.  Past Medical History:  Diagnosis Date  . Schizophrenia (HCC)   . Seizures (HCC)    Family History  Family history unknown: Yes   Past Surgical History:  Procedure Laterality Date  . MOUTH SURGERY     Patient Active Problem List   Diagnosis Date Noted  . Hypoglycemia 11/09/2018  . Schizophrenia, paranoid (HCC) 02/27/2018  . Cannabis use disorder, moderate, dependence (HCC) 02/27/2018  . Tobacco use disorder 02/27/2018  . 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 Altered mental status 12/28/2017  . Fever 08/11/2017  . Acute URI 08/11/2017  . Delirium 06/25/2017  . Schizophrenia (HCC) 06/25/2017  . Seizures (HCC) 06/25/2017      Prior to Admission medications   Medication Sig Start Date End Date Taking? Authorizing Provider  Ascorbic Acid (VITAMIN C) 1000 MG tablet Take 1,000 mg by mouth daily.    [provider]  divalproex (DEPAKOTE ER) 500 MG 24 hr tablet Take 500-1,000 mg by mouth See admin instructions. 500 mg every morning and 1000 mg at bedtime    [provider]  lamoTRIgine (LAMICTAL) 100 MG tablet Take 100-200 mg by mouth See admin instructions. 100 mg every morning and 200 mg at bedtime    [provider]  LORazepam (ATIVAN) 1 MG tablet Take 1 tablet (1 mg total) by mouth 2 (two) times daily as needed for seizure  (or shaking). 01/06/17   Jennye Moccasin, MD  meloxicam (MOBIC) 15 MG tablet Take 1 tablet (15 mg total) by mouth daily as needed for pain. 11/11/18   Enid Baas, MD  OLANZapine (ZYPREXA) 5 MG tablet Take 5 mg by mouth at bedtime.     [provider]  propranolol ER (INDERAL LA) 60 MG 24 hr capsule Take 60 mg by mouth daily.    [provider]  Vitamin D, Ergocalciferol, (DRISDOL) 50000 units CAPS capsule Take 50,000 Units by mouth every 7 (seven) days.    [provider]    Allergies Keppra [levetiracetam]; Trileptal [oxcarbazepine]; and Vimpat [lacosamide]    Social History Social History   Tobacco Use  . Smoking status: Current Every Day Smoker    Packs/day: 1.00    Types: Cigarettes  . Smokeless tobacco: Never Used  Substance Use Topics  . Alcohol use: Yes    Alcohol/week: 4.0 standard drinks    Types: 4 Shots of liquor per week    Comment: pt states 4 shots of liqour oer week  . Drug use: Yes    Types: Marijuana    Review of Systems Patient denies headaches, rhinorrhea, blurry vision, numbness, shortness of breath, chest pain, edema, cough, abdominal pain, nausea, vomiting, diarrhea, dysuria, fevers, rashes or hallucinations unless otherwise stated above in HPI. ____________________________________________   PHYSICAL EXAM:  VITAL SIGNS: Vitals:  11/28/18 1239 11/28/18 1250  BP: 122/77 114/65  Pulse: 69 69  Resp: 16 14  Temp: 98.2 F (36.8 C) 98 F (36.7 C)  SpO2: 97%     Constitutional: Alert and oriented. Well appearing and in no acute distress. Eyes: Conjunctivae are normal.  Head: Atraumatic. Nose: No congestion/rhinnorhea. Mouth/Throat: Mucous membranes are moist.   Neck: Painless ROM.  Cardiovascular:   Good peripheral circulation. Respiratory: Normal respiratory effort.  No retractions.  Gastrointestinal: Soft and nontender.  Musculoskeletal: Swelling and tenderness to the right lateral malleolus.  No proximal  fibular neck pain or discomfort.  No obvious deformity.  Neurovascular intact distally.  Patient able to ambulate..  No joint effusions. Neurologic:  Normal speech and language. No gross focal neurologic deficits are appreciated.  Skin:  Skin is warm, dry and intact. No rash noted.   ____________________________________________   LABS (all labs ordered are listed, but only abnormal results are displayed)  No results found for this or any previous visit (from the past 24 hour(s)). ____________________________________________ ____________________________________________  RADIOLOGY  I personally reviewed all radiographic images ordered to evaluate for the above acute complaints and reviewed radiology reports and findings.  These findings were personally discussed with the patient.  Please see medical record for radiology report.  ____________________________________________   PROCEDURES  Procedure(s) performed:  .Ortho Injury Treatment Date/Time: 11/28/2018 1:20 PM Performed by: Willy Eddyobinson, Arnika Larzelere, MD Authorized by: Willy Eddyobinson, Jessia Kief, MD   Consent:    Consent obtained:  Verbal   Consent given by:  PatientInjury location: ankle Location details: right ankle Injury type: fracture Fracture type: lateral malleolus Pre-procedure neurovascular assessment: neurovascularly intact Manipulation performed: no Immobilization: splint Splint type: ankle stirrup Supplies used: Ortho-Glass Post-procedure neurovascular assessment: post-procedure neurovascularly intact       Critical Care performed: no ____________________________________________   INITIAL IMPRESSION / ASSESSMENT AND PLAN / ED COURSE  Pertinent labs & imaging results that were available during my care of the patient were reviewed by me and considered in my medical decision making (see chart for details).  DDX: fracture, sprain, contusion  [facility] is a 28 y.o. who presents to the ED with 28 y.o. male with  acute right ankle injury. Patient is AFVSS in ED. Exam as above. Given current presentation have considered the above differential.Denies any other injuries. Denies motor or sensory loss. Able to bear weight. Afebrile and VSS in Ed. Exam as above. NV intact throughout and distal to injury. Pt able to range joint. No clinical suspicion for infectious process or septic joint. Treatments will include observation, X-rays.  X-rays distal nondisplaced fibular fracture. No other injuries reported or noted on exam.. Discussed supportive care and follow up with pt.       ____________________________________________   FINAL CLINICAL IMPRESSION(S) / ED DIAGNOSES  Final diagnoses:  Acute right ankle pain      NEW MEDICATIONS STARTED DURING THIS VISIT:  New Prescriptions   No medications on file     Note:  This document was prepared using Dragon voice recognition software and may include unintentional dictation errors.     Willy Eddyobinson, Asim Gersten, MD 11/28/18 1258    Willy Eddyobinson, Dakwan Pridgen, MD 11/28/18 1320    Willy Eddyobinson, Hazelyn Kallen, MD 11/28/18 1322

## 2019-02-06 ENCOUNTER — Emergency Department: Payer: Medicaid Other

## 2019-02-06 ENCOUNTER — Emergency Department
Admission: EM | Admit: 2019-02-06 | Discharge: 2019-02-06 | Disposition: A | Discharge status: Home / Self Care | Payer: Medicaid Other | Attending: Emergency Medicine | Admitting: Emergency Medicine

## 2019-02-06 ENCOUNTER — Encounter: Payer: Self-pay | Admitting: Emergency Medicine

## 2019-02-06 ENCOUNTER — Other Ambulatory Visit: Payer: Self-pay

## 2019-02-06 DIAGNOSIS — M791 Myalgia, unspecified site: Secondary | ICD-10-CM | POA: Insufficient documentation

## 2019-02-06 DIAGNOSIS — F1210 Cannabis abuse, uncomplicated: Secondary | ICD-10-CM | POA: Insufficient documentation

## 2019-02-06 DIAGNOSIS — Z79899 Other long term (current) drug therapy: Secondary | ICD-10-CM | POA: Diagnosis not present

## 2019-02-06 DIAGNOSIS — J101 Influenza due to other identified influenza virus with other respiratory manifestations: Secondary | ICD-10-CM

## 2019-02-06 DIAGNOSIS — F17210 Nicotine dependence, cigarettes, uncomplicated: Secondary | ICD-10-CM | POA: Diagnosis not present

## 2019-02-06 DIAGNOSIS — R569 Unspecified convulsions: Secondary | ICD-10-CM | POA: Diagnosis not present

## 2019-02-06 LAB — BASIC METABOLIC PANEL
Anion gap: 10 (ref 5–15)
BUN: 7 mg/dL (ref 6–20)
CO2: 22 mmol/L (ref 22–32)
Calcium: 9.4 mg/dL (ref 8.9–10.3)
Chloride: 103 mmol/L (ref 98–111)
Creatinine, Ser: 1.13 mg/dL (ref 0.61–1.24)
GFR calc Af Amer: >60 mL/min (ref >60)
GFR calc non Af Amer: >60 mL/min (ref >60)
Glucose, Bld: 98 mg/dL (ref 70–99)
Potassium: 4.2 mmol/L (ref 3.5–5.1)
Sodium: 135 mmol/L (ref 135–145)

## 2019-02-06 LAB — VALPROIC ACID LEVEL: Valproic Acid Lvl: 136 ug/mL — ABNORMAL HIGH (ref 50.0–100.0)

## 2019-02-06 LAB — INFLUENZA PANEL BY PCR (TYPE A & B)
Influenza A By PCR: POSITIVE — AB
Influenza B By PCR: NEGATIVE

## 2019-02-06 MED ORDER — OSELTAMIVIR PHOSPHATE 75 MG PO CAPS: 75.0000 mg | ORAL_CAPSULE | Freq: Two times a day (BID) | ORAL | 0 refills | Status: AC

## 2019-02-06 NOTE — ED Notes (Signed)
Pt in ED lobby to front desk asking when taxi would arrive. First nurse confirmed with RN that taxi was called and confirmed with Cheyenne Adas that pt was still on the list for pick up. Pt updated and sitting outside of ED front door.

## 2019-02-06 NOTE — ED Notes (Signed)
Spoke with Scarlett Presto [phone]), who is apart of pt's transport team, that states that she will come and pick up the pt around 1:30-1:45pm.

## 2019-02-06 NOTE — ED Provider Notes (Signed)
[facility] Emergency Department Provider Note   ____________________________________________    I have reviewed the triage vital signs and the nursing notes.   HISTORY  Chief Complaint Seizures     HPI [facility] is a 28 y.o. male with a history of schizophrenia and seizures who presents today after reported seizure.  Apparently had a witnessed generalized tonic-clonic seizure while being transported today.  Currently states he feels well overall.  He has had some body aches recently.  Denies nausea vomiting abdominal pain.  No tongue injury.  No recent travel otherwise.  Has been compliant with his medications.  Past Medical History:  Diagnosis Date  . Schizophrenia (HCC)   . Seizures [facility])     Patient Active Problem List   Diagnosis Date Noted  . Hypoglycemia 11/09/2018  . Schizophrenia, paranoid (HCC) 02/27/2018  . Cannabis use disorder, moderate, dependence (HCC) 02/27/2018  . Tobacco use disorder 02/27/2018  . 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 Altered mental status 12/28/2017  . Fever 08/11/2017  . Acute URI 08/11/2017  . Delirium 06/25/2017  . Schizophrenia (HCC) 06/25/2017  . Seizures (HCC) 06/25/2017    Past Surgical History:  Procedure Laterality Date  . MOUTH SURGERY      Prior to Admission medications   Medication Sig Start Date End Date Taking? Authorizing Provider  Ascorbic Acid (VITAMIN C) 1000 MG tablet Take 1,000 mg by mouth daily.   Yes [provider]  divalproex (DEPAKOTE ER) 500 MG 24 hr tablet Take 500-1,000 mg by mouth See admin instructions. 500 mg every morning and 1000 mg at bedtime   Yes [provider]  lamoTRIgine (LAMICTAL) 100 MG tablet Take 100-200 mg by mouth See admin instructions. 100 mg every morning and 200 mg at bedtime   Yes [provider]  OLANZapine (ZYPREXA) 5 MG tablet Take 5 mg by mouth at bedtime.    Yes [provider]  propranolol ER (INDERAL LA)  60 MG 24 hr capsule Take 60 mg by mouth daily.   Yes [provider]  risperiDONE (RISPERDAL) 3 MG tablet Take 6 mg by mouth Nightly. 10/01/16  Yes [provider]  Vitamin D, Ergocalciferol, (DRISDOL) 50000 units CAPS capsule Take 50,000 Units by mouth every 7 (seven) days.   Yes [provider]  LORazepam (ATIVAN) 1 MG tablet Take 1 tablet (1 mg total) by mouth 2 (two) times daily as needed for seizure (or shaking). 01/06/17   Jennye Moccasin, MD  meloxicam (MOBIC) 15 MG tablet Take 1 tablet (15 mg total) by mouth daily as needed for pain. 11/11/18   Enid Baas, MD  oseltamivir (TAMIFLU) 75 MG capsule Take 1 capsule (75 mg total) by mouth 2 (two) times daily for 5 days. 02/06/19 02/11/19  Jene Every, MD     Allergies Keppra [levetiracetam]; Trileptal [oxcarbazepine]; and Vimpat [lacosamide]  Family History  Family history unknown: Yes    Social History Social History   Tobacco Use  . Smoking status: Current Every Day Smoker    Packs/day: 1.00    Types: Cigarettes  . Smokeless tobacco: Never Used  Substance Use Topics  . Alcohol use: Yes    Alcohol/week: 4.0 standard drinks    Types: 4 Shots of liquor per week    Comment: pt states 4 shots of liqour oer week  . Drug use: Yes    Types: Marijuana    Review of Systems  Constitutional: No fever/chills Eyes: No visual changes.  ENT: No  sore throat. Cardiovascular: Denies chest pain. Respiratory: Mild cough Gastrointestinal: No abdominal pain.    Genitourinary: Negative for dysuria. Musculoskeletal: Body aches Skin: Negative for rash. Neurological: Negative for headaches or weakness   ____________________________________________   PHYSICAL EXAM:  VITAL SIGNS: ED Triage Vitals  Enc Vitals Group     BP 02/06/19 0931 100/60     Pulse Rate 02/06/19 0929 94     Resp 02/06/19 0929 18     Temp 02/06/19 0929 99.6 F (37.6 C)     Temp Source 02/06/19 0929 Oral     SpO2 02/06/19 0929 95  %     Weight 02/06/19 0930 93 kg (205 lb)     Height 02/06/19 0930 1.829 m (6')     Head Circumference --      Peak Flow --      Pain Score 02/06/19 0930 0     Pain Loc --      Pain Edu? --      Excl. in GC? --     Constitutional: Alert and oriented. Eyes: Conjunctivae are normal.   Nose: No congestion/rhinnorhea. Mouth/Throat: Mucous membranes are moist.    Cardiovascular: Normal rate, regular rhythm. Grossly normal heart sounds.  Good peripheral circulation. Respiratory: Normal respiratory effort.  No retractions. Lungs CTAB. Gastrointestinal: Soft and nontender. No distention.    Musculoskeletal:  Warm and well perfused Neurologic:  Normal speech and language. No gross focal neurologic deficits are appreciated.  Skin:  Skin is warm, dry and intact. No rash noted. Psychiatric: Mood and affect are normal. Speech and behavior are normal.  ____________________________________________   LABS (all labs ordered are listed, but only abnormal results are displayed)  Labs Reviewed  VALPROIC ACID LEVEL - Abnormal; Notable for the following components:      Result Value   Valproic Acid Lvl 136 (*)    All other components within normal limits  INFLUENZA PANEL BY PCR (TYPE A & B) - Abnormal; Notable for the following components:   Influenza A By PCR POSITIVE (*)    All other components within normal limits  BASIC METABOLIC PANEL  CBC   ____________________________________________  EKG  None ____________________________________________  RADIOLOGY  Chest x-ray ____________________________________________   PROCEDURES  Procedure(s) performed: No  Procedures   Critical Care performed: No ____________________________________________   INITIAL IMPRESSION / ASSESSMENT AND PLAN / ED COURSE  Pertinent labs & imaging results that were available during my care of the patient were reviewed by me and considered in my medical decision making (see chart for details).   Patient presents after seizure.  He is well-appearing and at his baseline currently.  Reports compliance with his medications.  Is not certain when his last seizure was.  Does complain of some mild body aches, will check an influenza.  Patient is positive for influenza a.  We will prescribe Tamiflu, he has been seizure-free during his ED observation time.  Appropriate for discharge at this time    ____________________________________________   FINAL CLINICAL IMPRESSION(S) / ED DIAGNOSES  Final diagnoses:  Seizure (HCC)  Influenza A        Note:  This document was prepared using Dragon voice recognition software and may include unintentional dictation errors.   Jene Every, MD 02/06/19 1300

## 2019-02-06 NOTE — ED Triage Notes (Signed)
PT arrives via ems. Pt lives at home but was being transferred via transport Zenaida Niece to [employer] for vocational training for disabled adults per ems report. PT had witnessed seizure by Zenaida Niece driver. PT arrives to ED a&o x 4. Pt reports compliance with his Depakote. Vital signs WDL for ems. CBG 124

## 2019-02-06 NOTE — ED Notes (Signed)
Seizure pads placed

## 2019-02-06 NOTE — Discharge Instructions (Addendum)
Daniel Craig has the flu. We have prescribed tamiflu for him. Please continue seizure medications as prescribed

## 2019-02-06 NOTE — ED Notes (Signed)
S[place], transport, came to take pt home but did not want to transport pt with influenza. She instead left money for pt to get home by cab. Called also placed to pt's mother Marylene Land to update on pt's disposition. Verbal permission for son to sit outside per mother.

## 2019-05-17 ENCOUNTER — Emergency Department
Admission: EM | Admit: 2019-05-17 | Discharge: 2019-05-18 | Disposition: A | Discharge status: Home / Self Care | Payer: Medicaid Other | Attending: Emergency Medicine | Admitting: Emergency Medicine

## 2019-05-17 DIAGNOSIS — R569 Unspecified convulsions: Secondary | ICD-10-CM | POA: Diagnosis not present

## 2019-05-17 DIAGNOSIS — R440 Auditory hallucinations: Secondary | ICD-10-CM | POA: Diagnosis not present

## 2019-05-17 DIAGNOSIS — F209 Schizophrenia, unspecified: Secondary | ICD-10-CM | POA: Insufficient documentation

## 2019-05-17 DIAGNOSIS — Z0489 Encounter for examination and observation for other specified reasons: Secondary | ICD-10-CM | POA: Diagnosis present

## 2019-05-17 DIAGNOSIS — F17210 Nicotine dependence, cigarettes, uncomplicated: Secondary | ICD-10-CM | POA: Insufficient documentation

## 2019-05-17 DIAGNOSIS — Z20828 Contact with and (suspected) exposure to other viral communicable diseases: Secondary | ICD-10-CM | POA: Insufficient documentation

## 2019-05-17 DIAGNOSIS — F200 Paranoid schizophrenia: Secondary | ICD-10-CM | POA: Diagnosis not present

## 2019-05-17 DIAGNOSIS — Z79899 Other long term (current) drug therapy: Secondary | ICD-10-CM | POA: Insufficient documentation

## 2019-05-17 DIAGNOSIS — F1220 Cannabis dependence, uncomplicated: Secondary | ICD-10-CM | POA: Insufficient documentation

## 2019-05-17 NOTE — ED Notes (Signed)
Patients legal guardian is his father [facility] Craig [phone].

## 2019-05-17 NOTE — ED Triage Notes (Signed)
Pt. Arrived via EMS after c/o bizarre behavior. Patient required IM medications of 2mg  Versed and 5mg  Haldol to get him to calm down and allow transport. Patient sleepy and was moved from EMS stretcher to [facility] bed with EMS and ED staff. Pt. arrousable but not ambulatory.

## 2019-05-18 DIAGNOSIS — R440 Auditory hallucinations: Secondary | ICD-10-CM

## 2019-05-18 DIAGNOSIS — F1220 Cannabis dependence, uncomplicated: Secondary | ICD-10-CM

## 2019-05-18 DIAGNOSIS — F200 Paranoid schizophrenia: Secondary | ICD-10-CM

## 2019-05-18 DIAGNOSIS — R569 Unspecified convulsions: Secondary | ICD-10-CM

## 2019-05-18 LAB — CBC
HCT: 38.8 % — ABNORMAL LOW (ref 39.0–52.0)
Hemoglobin: 13.3 g/dL (ref 13.0–17.0)
MCH: 31.6 pg (ref 26.0–34.0)
MCHC: 34.3 g/dL (ref 30.0–36.0)
MCV: 92.2 fL (ref 80.0–100.0)
Platelets: 148 10*3/uL — ABNORMAL LOW (ref 150–400)
RBC: 4.21 MIL/uL — ABNORMAL LOW (ref 4.22–5.81)
RDW: 12.5 % (ref 11.5–15.5)
WBC: 6.9 10*3/uL (ref 4.0–10.5)
nRBC: 0 % (ref 0.0–0.2)

## 2019-05-18 LAB — COMPREHENSIVE METABOLIC PANEL
ALT: 12 U/L (ref 0–44)
AST: 24 U/L (ref 15–41)
Albumin: 3.8 g/dL (ref 3.5–5.0)
Alkaline Phosphatase: 33 U/L — ABNORMAL LOW (ref 38–126)
Anion gap: 9 (ref 5–15)
BUN: 11 mg/dL (ref 6–20)
CO2: 23 mmol/L (ref 22–32)
Calcium: 8.9 mg/dL (ref 8.9–10.3)
Chloride: 103 mmol/L (ref 98–111)
Creatinine, Ser: 0.94 mg/dL (ref 0.61–1.24)
GFR calc Af Amer: >60 mL/min (ref >60)
GFR calc non Af Amer: >60 mL/min (ref >60)
Glucose, Bld: 97 mg/dL (ref 70–99)
Potassium: 4.1 mmol/L (ref 3.5–5.1)
Sodium: 135 mmol/L (ref 135–145)
Total Bilirubin: 0.6 mg/dL (ref 0.3–1.2)
Total Protein: 7 g/dL (ref 6.5–8.1)

## 2019-05-18 LAB — URINE DRUG SCREEN, QUALITATIVE (ARMC ONLY)
Amphetamines, Ur Screen: NOT DETECTED
Barbiturates, Ur Screen: NOT DETECTED
Benzodiazepine, Ur Scrn: POSITIVE — AB
Cannabinoid 50 Ng, Ur ~~LOC~~: POSITIVE — AB
Cocaine Metabolite,Ur ~~LOC~~: NOT DETECTED
MDMA (Ecstasy)Ur Screen: NOT DETECTED
Methadone Scn, Ur: NOT DETECTED
Opiate, Ur Screen: NOT DETECTED
Phencyclidine (PCP) Ur S: NOT DETECTED
Tricyclic, Ur Screen: NOT DETECTED

## 2019-05-18 LAB — SARS CORONAVIRUS 2 BY RT PCR (HOSPITAL ORDER, PERFORMED IN ~~LOC~~ HOSPITAL LAB): SARS Coronavirus 2: NEGATIVE

## 2019-05-18 LAB — VALPROIC ACID LEVEL: Valproic Acid Lvl: 96 ug/mL (ref 50.0–100.0)

## 2019-05-18 LAB — ETHANOL: Alcohol, Ethyl (B): <10 mg/dL (ref <10)

## 2019-05-18 MED ORDER — LORAZEPAM 1 MG PO TABS: 1.0000 mg | ORAL_TABLET | Freq: Two times a day (BID) | ORAL | Status: DC | PRN

## 2019-05-18 MED ORDER — PROPRANOLOL HCL ER 60 MG PO CP24: 60.0000 mg | ORAL_CAPSULE | Freq: Every day | ORAL | Status: DC

## 2019-05-18 MED ORDER — LAMOTRIGINE 100 MG PO TABS: 200.0000 mg | ORAL_TABLET | Freq: Every day | ORAL | Status: DC

## 2019-05-18 MED ORDER — DIVALPROEX SODIUM ER 250 MG PO TB24: 500.0000 mg | ORAL_TABLET | ORAL | Status: DC

## 2019-05-18 MED ORDER — LAMOTRIGINE 100 MG PO TABS: 100.0000 mg | ORAL_TABLET | ORAL | Status: DC

## 2019-05-18 MED ORDER — LAMOTRIGINE 100 MG PO TABS: 100.0000 mg | ORAL_TABLET | Freq: Every morning | ORAL | Status: DC

## 2019-05-18 MED ORDER — DIVALPROEX SODIUM ER 500 MG PO TB24: 1000.0000 mg | ORAL_TABLET | Freq: Every day | ORAL | Status: DC

## 2019-05-18 MED ORDER — DIVALPROEX SODIUM ER 500 MG PO TB24: 500.0000 mg | ORAL_TABLET | Freq: Every morning | ORAL | Status: DC

## 2019-05-18 MED ORDER — OLANZAPINE 5 MG PO TABS: 5.0000 mg | ORAL_TABLET | Freq: Every day | ORAL | Status: DC

## 2019-05-18 NOTE — BH Assessment (Addendum)
Assessment Note  Daniel Craig is an 28 y.o. male who presents to ED with reports of "hearing voices and having seizures". Pt reports while having a seizure he pushed his Life Alert response and was transported to the ED. Pt reports having command hallucinations to hurt other people "no one specific - they just say do this and do that". He reports these command hallucinations are constant; however, they started to get worse yesterday. He denied past/current suicidal ideations and/or attempts. He further reports increased depressive symptoms that have caused him to isolate from others. Pt reports smoking cannabis (2 blunts) yesterday; however, he denies use of alcohol and other drugs. Pt is currently receiving outpatient mental health services through [name] Peer Support and [name] for medication management. Pt's last appointment with [facility] was on 05/02/2019. He reports being compliant with his current medications. One of his medications (unknown) were changed 2 weeks ago by his outpatient psychiatrist. Pt currently lives alone and his father has been appointed as his legal guardian Daniel Craig(Daniel Craig: [phone]). Pt did not appear to be responding to internal stimuli. Pt was somewhat drowsy in his level of consciousness throughout assessment after being awakened by this Clinical research associatewriter; however, he was oriented x4.  Collateral information was obtained from patient's father Daniel Craig: [phone]): "I had him with him all day yesterday and he called me to tell me to pray for him. He said people were talking to him and he was hearing voices. His voices get worse after he has a seizure. Father reports he is concerned with pt's safety because he lives alone. I don't think he would harm himself or anyone else intentionally but things sometimes sets him off. Most of the time he is calm. [name] is suppose to set up ACTT services but I don't think they've set  it up". Pt has a Advertising copywritereer Support through [facility] Daniel Craig(Daniel Craig: [phone]). Pt's peer support specialist reports she can pick up pt when/if he is discharged from the ED.   Diagnosis: Schizophrenia, by history  Past Medical History:  Past Medical History:  Diagnosis Date  . Schizophrenia (HCC)   . Seizures (HCC)     Past Surgical History:  Procedure Laterality Date  . MOUTH SURGERY      Family History:  Family History  Family history unknown: Yes    Social History:  reports that he has been smoking cigarettes. He has been smoking about 1.00 pack per day. He has never used smokeless tobacco. He reports current alcohol use of about 4.0 standard drinks of alcohol per week. He reports current drug use. Drug: Marijuana.  Additional Social History:  Alcohol / Drug Use Pain Medications: See MAR Prescriptions: See MAR Over the Counter: See MAR History of alcohol / drug use?: Yes Longest period of sobriety (when/how long): UKN Negative Consequences of Use: Financial, Personal relationships, Work / School Withdrawal Symptoms: (None Reported) Substance #1 Name of Substance 1: Cannabis 1 - Age of First Use: Unable to Quantify 1 - Amount (size/oz): 2 blunts 1 - Frequency: Daily 1 - Duration: Years 1 - Last Use / Amount: 05/17/2019  CIWA: CIWA-Ar BP: (!) 113/56 Pulse Rate: 72 COWS:    Allergies:  Allergies  Allergen Reactions  . Keppra [Levetiracetam]   . Trileptal [Oxcarbazepine]   . Vimpat [Lacosamide]     Home Medications: (Not in a Craig admission)   OB/GYN Status:  No LMP for male patient.  General Assessment Data [facility]  ED TTS Assessment: In system Is this a Tele or Face-to-Face Assessment?: Face-to-Face Is this an Initial Assessment or a Re-assessment for this encounter?: Initial Assessment Patient Accompanied by:: N/A Language Other than English: No Living Arrangements: Other (Comment)(Private Residence) What gender do  you identify as?: Male Marital status: Single Maiden name: N/A Living Arrangements: Alone Can pt return to current living arrangement?: Yes Admission Status: Voluntary Is patient capable of signing voluntary admission?: Yes Referral Source: Self/Family/Friend Insurance type: [place] Medicaid  Medical Screening Exam ([place]) Medical Exam completed: Yes  Crisis Care Plan Living Arrangements: Alone Legal Guardian: Father Name of Psychiatrist: [facility] Name of Therapist: [facility]  Education Status Is patient currently in school?: No 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 : No Suicidal Intent: No Has patient had any suicidal intent within the past 6 months prior to admission? : No Is patient at risk for suicide?: No Suicidal Plan?: No Has patient had any suicidal plan within the past 6 months prior to admission? : No Access to Means: No What has been your use of drugs/alcohol within the last 12 months?: Cannabis Previous Attempts/Gestures: No How many times?: 0 Other Self Harm Risks: None Reported Triggers for Past Attempts: None known Intentional Self Injurious Behavior: None Family Suicide History: Unknown Recent stressful life event(s): Other (Comment)(Medical concerns; auditory hallucinations) Persecutory voices/beliefs?: No Depression: Yes Depression Symptoms: Isolating, Feeling worthless/self pity Substance abuse history and/or treatment for substance abuse?: No Suicide prevention information given to non-admitted patients: Not applicable  Risk to Others within the past 6 months Homicidal Ideation: No Does patient have any lifetime risk of violence  toward others beyond the six months prior to admission? : No Thoughts of Harm to Others: No Current Homicidal Intent: No Current Homicidal Plan: No Access to Homicidal Means: No Identified Victim: N/A History of harm to others?: No Assessment of Violence: None Noted Violent Behavior Description: N/A Does patient have access to weapons?: No Criminal Charges Pending?: No Does patient have a court date: No Is patient on probation?: No  Psychosis Hallucinations: Auditory, With command Delusions: None noted  Mental Status Report Appearance/Hygiene: In scrubs Eye Contact: Poor Motor Activity: Freedom of movement Speech: Logical/coherent Level of Consciousness: Drowsy Mood: Depressed Affect: Flat Anxiety Level: Minimal Thought Processes: Coherent, Relevant Judgement: Unimpaired Orientation: Person, Place, Time, Situation, Appropriate for developmental age Obsessive Compulsive Thoughts/Behaviors: None  Cognitive Functioning Concentration: Normal Memory: Recent Intact, Remote Intact Is patient IDD: No Insight: Fair Impulse Control: Fair Appetite: Good Have you had any weight changes? : No Change Sleep: No Change Total Hours of Sleep: 8 Vegetative Symptoms: None  ADLScreening [facility]) Patient's cognitive ability adequate to safely complete daily activities?: Yes Patient able to express need for assistance with ADLs?: Yes Independently performs ADLs?: Yes (appropriate for developmental age)  Prior Inpatient Therapy Prior Inpatient Therapy: Yes Prior Therapy Dates: 02/2018 Prior Therapy Facilty/Provider(s): [facility] Reason for Treatment: Auditory Hallucinations  Prior Outpatient Therapy Prior Outpatient Therapy: Yes Prior Therapy Dates: Current Prior Therapy Facilty/Provider(s): [place] Reason for Treatment: Medication Management Does patient have an ACCT team?: Unknown Does patient have Intensive In-House Services?  : No Does patient have  [facility] services? : No Does  patient have P4CC services?: No  ADL Screening (condition at time of admission) Patient's cognitive ability adequate to safely complete daily activities?: Yes Patient able to express need for assistance with ADLs?: Yes Independently performs ADLs?: Yes (appropriate for developmental age)       Abuse/Neglect Assessment (Assessment to be complete while patient is alone) Abuse/Neglect Assessment Can Be Completed: Yes Physical Abuse: Denies Verbal Abuse: Denies Sexual Abuse: Denies Exploitation of patient/patient's resources: Denies Self-Neglect: Denies Values / Beliefs Cultural Requests During Hospitalization: None Spiritual Requests During Hospitalization: None Consults Spiritual Care Consult Needed: No Social Work Consult Needed: No         Child/Adolescent Assessment Running Away Risk: (Patient is an adult)  Disposition:  Disposition Initial Assessment Completed for this Encounter: Yes Disposition of Patient: Admit Type of inpatient treatment program: Adult Patient refused recommended treatment: No Mode of transportation if patient is discharged/movement?: N/A Patient referred to: [facility] [facility])  On Site Evaluation by:   Reviewed with Physician:    Wilmon ArmsSTEVENSON, Suella Cogar 05/18/2019 12:39 PM

## 2019-05-18 NOTE — ED Notes (Signed)
Hourly rounding reveals patient sleeping in hall bed. No complaints, stable, in no acute distress. Q15 minute rounds and monitoring via Rover and Officer to continue.  

## 2019-05-18 NOTE — ED Provider Notes (Signed)
-----------------------------------------   4:25 PM on 05/18/2019 -----------------------------------------  Patient remains medically stable in the ED.  Has been seen by psychiatry nurse practitioner who finds him stable for outpatient follow-up.  Collateral information has been obtained by the NP and no additional safety concerns or symptoms are reported.  According to the nurse practitioner, she did discuss with the patient's father who is his guardian who is agreeable with discharge plan.   Carrie Mew, MD 05/18/19 1626

## 2019-05-18 NOTE — Discharge Instructions (Addendum)
Follow up with [facility]

## 2019-05-18 NOTE — ED Notes (Signed)
Pt discharged home. VS stable. Pt denies SI/HI and pain. Discharge instructions reviewed with patient. All belongings returned to patient. Pt discharged in care of care coordinator.

## 2019-05-18 NOTE — ED Provider Notes (Signed)
[facility] Emergency Department Provider Note  ____________________________________________  Time seen: Approximately 12:05 AM  I have reviewed the triage vital signs and the nursing notes.   HISTORY  Chief Complaint Medical Clearance ([place] [facility])  Level 5 caveat:  Portions of the history and physical were unable to be obtained due to sedation   HPI Daniel Craig is a 28 y.o. male with a history of schizophrenia, seizures who presents via EMS for bizarre behavior.  EMS was called to group home as patient was behaving erratic.  Patient was very agitated and received 2 mg of IM Versed and 5 mg of IM Haldol in route.  At this time patient is sedated and unable to provide any history.  He will open his eyes to sternal rub but will not answer questions or follow any commands.  Past Medical History:  Diagnosis Date  . Schizophrenia ([place])   . Seizures [facility])     Patient Active Problem List   Diagnosis Date Noted  . Hypoglycemia 11/09/2018  . Schizophrenia, paranoid ([place]) 02/27/2018  . Cannabis use disorder, moderate, dependence ([place]) 02/27/2018  . Tobacco use disorder 02/27/2018  . 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 Altered mental status 12/28/2017  . Fever 08/11/2017  . Acute URI 08/11/2017  . Delirium 06/25/2017  . Schizophrenia ([place]) 06/25/2017  . Seizures ([place]) 06/25/2017    Past Surgical History:  Procedure Laterality Date  . MOUTH SURGERY      Prior to Admission medications   Medication Sig Start Date End Date Taking? Authorizing Provider  Ascorbic Acid (VITAMIN C) 1000 MG tablet Take 1,000 mg by mouth daily.    [provider]  divalproex (DEPAKOTE ER) 500 MG 24 hr tablet Take 500-1,000 mg by mouth See admin instructions. 500 mg every morning and 1000 mg at bedtime    [provider]  lamoTRIgine (LAMICTAL) 100 MG tablet Take 100-200 mg by mouth See admin instructions. 100 mg every morning and 200 mg at bedtime     [provider]  LORazepam (ATIVAN) 1 MG tablet Take 1 tablet (1 mg total) by mouth 2 (two) times daily as needed for seizure (or shaking). 01/06/17   Daymon Larsen, MD  meloxicam (MOBIC) 15 MG tablet Take 1 tablet (15 mg total) by mouth daily as needed for pain. 11/11/18   Gladstone Lighter, MD  OLANZapine (ZYPREXA) 5 MG tablet Take 5 mg by mouth at bedtime.     [provider]  propranolol ER (INDERAL LA) 60 MG 24 hr capsule Take 60 mg by mouth daily.    [provider]  risperiDONE (RISPERDAL) 3 MG tablet Take 6 mg by mouth Nightly. 10/01/16   [provider]  Vitamin D, Ergocalciferol, (DRISDOL) 50000 units CAPS capsule Take 50,000 Units by mouth every 7 (seven) days.    [provider]    Allergies Keppra [levetiracetam], Trileptal [oxcarbazepine], and Vimpat [lacosamide]  Family History  Family history unknown: Yes    Social History Social History   Tobacco Use  . Smoking status: Current Every Day Smoker    Packs/day: 1.00    Types: Cigarettes  . Smokeless tobacco: Never Used  Substance Use Topics  . Alcohol use: Yes    Alcohol/week: 4.0 standard drinks    Types: 4 Shots of liquor per week    Comment: pt states 4 shots of liqour oer week  . Drug use: Yes    Types: Marijuana    Review of Systems Psych: + erratic  behavior  ____________________________________________   PHYSICAL EXAM:  VITAL SIGNS: ED Triage Vitals [05/17/19 2333]  Enc Vitals Group     BP 105/62     Pulse Rate 72     Resp 16     Temp 98.3 F (36.8 C)     Temp Source Oral     SpO2 99 %     Weight      Height      Head Circumference      Peak Flow      Pain Score      Pain Loc      Pain Edu?      Excl. in GC?     Constitutional: Sleeping, arousable to sternum rub, not answering questions or following commands HEENT:      Head: Normocephalic and atraumatic.         Eyes: Conjunctivae are normal. Sclera is non-icteric.       Mouth/Throat:  Mucous membranes are moist.       Neck: Supple with no signs of meningismus. Cardiovascular: Regular rate and rhythm. No murmurs, gallops, or rubs.  Respiratory: Normal respiratory effort. Lungs are clear to auscultation bilaterally.  Gastrointestinal: Soft, non distended with positive bowel sounds.  Musculoskeletal:  No edema, cyanosis, or erythema of extremities. Neurologic:  Face is symmetric. Moving all extremities. No gross focal neurologic deficits are appreciated. Skin: Skin is warm, dry and intact. No rash noted. Psychiatric: Unable to evaluate at this time  ____________________________________________   LABS (all labs ordered are listed, but only abnormal results are displayed)  Labs Reviewed  COMPREHENSIVE METABOLIC PANEL - Abnormal; Notable for the following components:      Result Value   Alkaline Phosphatase 33 (*)    All other components within normal limits  CBC - Abnormal; Notable for the following components:   RBC 4.21 (*)    HCT 38.8 (*)    Platelets 148 (*)    All other components within normal limits  URINE DRUG SCREEN, QUALITATIVE ([facility] ONLY) - Abnormal; Notable for the following components:   Cannabinoid 50 Ng, Ur [place] POSITIVE (*)    Benzodiazepine, Ur Scrn POSITIVE (*)    All other components within normal limits  ETHANOL   ____________________________________________  EKG  none  ____________________________________________  RADIOLOGY  none  ____________________________________________   PROCEDURES  Procedure(s) performed: None Procedures Critical Care performed:  None ____________________________________________   INITIAL IMPRESSION / ASSESSMENT AND PLAN / ED COURSE   28 y.o. male with a history of schizophrenia, seizures who presents via EMS for bizarre behavior.  Unfortunately I am unable to get any history from the patient at this time as he received IM Haldol and Versed in route due to erratic behavior. Patient is currently sleeping,  arousable to sternal rub, will open his eyes but not answer to any questions or follow commands, will fall right back to sleep.  No signs of trauma on exam.  No signs of active seizure.  Labs for medical clearance are pending.  Will consult psychiatry.    _________________________ 6:12 AM on 05/18/2019 -----------------------------------------  Labs for medical clearance with no acute findings.  Patient sleeping no distress.  Awaiting psychiatric evaluation   As part of my medical decision making, I reviewed the following data within the electronic MEDICAL RECORD NUMBER Nursing notes reviewed and incorporated, Labs reviewed , Old chart reviewed, A consult was requested and obtained from this/these consultant(s) Psychiatry, Notes from prior ED visits and [place] Controlled Substance Database  Pertinent labs & imaging results that were available during my care of the patient were reviewed by me and considered in my medical decision making (see chart for details).    ____________________________________________   FINAL CLINICAL IMPRESSION(S) / ED DIAGNOSES  Final diagnoses:  Schizophrenia, unspecified type (HCC)      NEW MEDICATIONS STARTED DURING THIS VISIT:  ED Discharge Orders    None       Note:  This document was prepared using Dragon voice recognition software and may include unintentional dictation errors.    Don PerkingVeronese, [place], MD 05/18/19 [phone]

## 2019-05-18 NOTE — Consult Note (Addendum)
[facility] ED Discharge  05/18/2019 2:15 PM Daniel Craig  MRN:  [id number] Principal Problem: Schizophrenia, paranoid [facility]) Discharge Diagnoses: Principal Problem:   Schizophrenia, paranoid ([place]) Active Problems:   Cannabis use disorder, moderate, dependence ([place])  Subjective: "I started freaking out because I was having little seizures and scared I was going to have a clonic one so I pushed my life button."  Reports he always hears voices but they become "more intense" during his seizures.    HPI:  Patient presented to the ED with "c/o bizarre behavior."  Given a benzo and Haldol and he slept.  Today, he is clear and coherent, calmly watching television in his room.  Reported the above information and explains he has a Glass blower/designer at [employer] who handles his medications in "bubble packs" which he has been taking. Does report his medication changed recently and his Depakote pill is now white vs a color, will inform his med management team.  His Peer Support person comes twice a week.  Denies suicidal/homicidal ideations.  Hallucinations are at their baseline today as he always has them.  He was also using cannabis yesterday which could have prompted an increase in his hallucinations, educated the patient about this possibility.  Collateral information obtained from his father, Care Coordinator, and a neighbor (See note from TTS).  The neighbor is Judithann Graves and does not have any safety concerns for the patient.    Total Time spent with patient: one hour  Past Psychiatric History: schizophrenia  Past Medical History:  Past Medical History:  Diagnosis Date  . Schizophrenia ([place])   . Seizures ([place])     Past Surgical History:  Procedure Laterality Date  . MOUTH SURGERY     Family History:  Family History  Family history unknown: Yes   Family Psychiatric  History: none Social History:  Social History   Substance and Sexual Activity  Alcohol Use Yes  . Alcohol/week:  4.0 standard drinks  . Types: 4 Shots of liquor per week   Comment: pt states 4 shots of liqour oer week     Social History   Substance and Sexual Activity  Drug Use Yes  . Types: Marijuana    Social History   Socioeconomic History  . Marital status: Single    Spouse name: Not on file  . Number of children: Not on file  . Years of education: Not on file  . Highest education level: Not on file  Occupational History  . Not on file  Social Needs  . Financial resource strain: Not on file  . Food insecurity    Worry: Not on file    Inability: Not on file  . Transportation needs    Medical: Not on file    Non-medical: Not on file  Tobacco Use  . Smoking status: Current Every Day Smoker    Packs/day: 1.00    Types: Cigarettes  . Smokeless tobacco: Never Used  Substance and Sexual Activity  . Alcohol use: Yes    Alcohol/week: 4.0 standard drinks    Types: 4 Shots of liquor per week    Comment: pt states 4 shots of liqour oer week  . Drug use: Yes    Types: Marijuana  . Sexual activity: Not on file  Lifestyle  . Physical activity    Days per week: Not on file    Minutes per session: Not on file  . Stress: Not on file  Relationships  . Social connections  Talks on phone: Not on file    Gets together: Not on file    Attends religious service: Not on file    Active member of club or organization: Not on file    Attends meetings of clubs or organizations: Not on file    Relationship status: Not on file  Other Topics Concern  . Not on file  Social History Narrative  . Not on file    Has this patient used any form of tobacco in the last 30 days? (Cigarettes, Smokeless Tobacco, Cigars, and/or Pipes) NA  Current Medications: Current Facility-Administered Medications  Medication Dose Route Frequency Provider Last Rate Last Dose  . divalproex (DEPAKOTE ER) 24 hr tablet 1,000 mg  1,000 mg Oral QHS Charm RingsLord, Jayna Mulnix Y, NP      . Melene Muller[START ON 05/19/2019] divalproex (DEPAKOTE ER)  24 hr tablet 500 mg  500 mg Oral q morning - 10a Charm RingsLord, Leianne Callins Y, NP      . Melene Muller[START ON 05/19/2019] lamoTRIgine (LAMICTAL) tablet 100 mg  100 mg Oral q morning - 10a Charm RingsLord, Leul Narramore Y, NP      . lamoTRIgine (LAMICTAL) tablet 200 mg  200 mg Oral QHS Charm RingsLord, Grae Leathers Y, NP      . LORazepam (ATIVAN) tablet 1 mg  1 mg Oral BID PRN Charm RingsLord, Tommi Crepeau Y, NP      . OLANZapine (ZYPREXA) tablet 5 mg  5 mg Oral QHS Charm RingsLord, Joclynn Lumb Y, NP      . propranolol ER (INDERAL LA) 24 hr capsule 60 mg  60 mg Oral Daily Charm RingsLord, Carver Murakami Y, NP   Stopped at 05/18/19 1156   Current Outpatient Medications  Medication Sig Dispense Refill  . Ascorbic Acid (VITAMIN C) 1000 MG tablet Take 1,000 mg by mouth daily.    . divalproex (DEPAKOTE ER) 500 MG 24 hr tablet Take 500-1,000 mg by mouth See admin instructions. 500 mg every morning and 1000 mg at bedtime    . lamoTRIgine (LAMICTAL) 100 MG tablet Take 100-200 mg by mouth See admin instructions. 100 mg every morning and 200 mg at bedtime    . LORazepam (ATIVAN) 1 MG tablet Take 1 tablet (1 mg total) by mouth 2 (two) times daily as needed for seizure (or shaking). 10 tablet 0  . meloxicam (MOBIC) 15 MG tablet Take 1 tablet (15 mg total) by mouth daily as needed for pain.    Marland Kitchen. OLANZapine (ZYPREXA) 5 MG tablet Take 5 mg by mouth at bedtime.     . propranolol ER (INDERAL LA) 60 MG 24 hr capsule Take 60 mg by mouth daily.    . risperiDONE (RISPERDAL) 3 MG tablet Take 6 mg by mouth Nightly.    . Vitamin D, Ergocalciferol, (DRISDOL) 50000 units CAPS capsule Take 50,000 Units by mouth every 7 (seven) days.     PTA Medications: (Not in a hospital admission)   Musculoskeletal: Strength & Muscle Tone: within normal limits Gait & Station: normal Patient leans: N/A  Psychiatric Specialty Exam: Physical Exam  Nursing note and vitals reviewed. Constitutional: He is oriented to person, place, and time. He appears well-developed and well-nourished.  HENT:  Head: Normocephalic.  Neck: Normal range  of motion.  Respiratory: Effort normal.  Musculoskeletal: Normal range of motion.  Neurological: He is alert and oriented to person, place, and time.  Psychiatric: His speech is normal and behavior is normal. Judgment and thought content normal. His mood appears anxious. Cognition and memory are normal.    Review of Systems  Psychiatric/Behavioral:  The patient is nervous/anxious.   All other systems reviewed and are negative.   Blood pressure (!) 113/56, pulse 72, temperature 98.3 F (36.8 C), temperature source Oral, resp. rate 16, SpO2 99 %.There is no height or weight on file to calculate BMI.  General Appearance: Casual  Eye Contact:  Good  Speech:  Normal Rate  Volume:  Normal  Mood:  Anxious, mild  Affect:  Congruent  Thought Process:  Coherent and Descriptions of Associations: Intact  Orientation:  Full (Time, Place, and Person)  Thought Content:  WDL and Logical with chronic auditory hallucinations, reports at baseline  Suicidal Thoughts:  No  Homicidal Thoughts:  No  Memory:  Immediate;   Good Recent;   Good Remote;   Good  Judgement:  Good  Insight:  Good  Psychomotor Activity:  Normal  Concentration:  Concentration: Good and Attention Span: Good  Recall:  Good  Fund of Knowledge:  Good  Language:  Good  Akathisia:  No  Handed:  Right  AIMS (if indicated):     Assets:  Communication Skills Desire for Improvement Financial Resources/Insurance Housing Leisure Time Physical Health Resilience Social Support Talents/Skills Transportation Vocational/Educational  ADL's:  Intact  Cognition:  WNL  Sleep:        Demographic Factors:  Male, Adolescent or young adult and Living alone  Loss Factors: NA  Historical Factors: NA  Risk Reduction Factors:   Sense of responsibility to family, Living with another person, especially a relative, Positive social support, Positive therapeutic relationship and Positive coping skills or problem solving  skills  Continued Clinical Symptoms:  Anxiety, mild  Cognitive Features That Contribute To Risk:  None    Suicide Risk:  Minimal: No identifiable suicidal ideation.  Patients presenting with no risk factors but with morbid ruminations; may be classified as minimal risk based on the severity of the depressive symptoms   Plan Of Care/Follow-up recommendations:  Schizophrenia, paranoid: -Restarted Zyprexa 5 mg at bedtime to BID while in ED -Did not restart the Risperdal 6 mg at bedtime as he is discharging  Seizures: -Restarted Depakote 500 mg in am and 1000 mg in the pm -valproic acid level ordered -Restarted Lamcital 100 mg in the am and 200 mg in the pm -Continued Ativan 1 mg BID PRN seizures  HTN and anxiety: -Propranolol 60 mg daily  Activity:  as tolerated Diet:  heart healthy diet  Disposition: discharge to Care Coordinator or Peer Support Nanine MeansJamison Kyon Bentler, NP 05/18/2019, 2:15 PM

## 2019-05-18 NOTE — ED Notes (Signed)
Pt's care coordinator will be here to pick up patient at 1630.

## 2019-05-18 NOTE — ED Notes (Signed)
Patient ate 100% of breakfast and beverage. Patient is calm and cooperative, he used phone to call His dad.

## 2019-05-18 NOTE — ED Notes (Signed)
Called lab and said they would add on a Valproic acid level.

## 2019-05-18 NOTE — ED Notes (Signed)
Patient transferred to [facility], reported to [place]. Patient is cooperative and calm.

## 2019-05-27 ENCOUNTER — Emergency Department
Admission: EM | Admit: 2019-05-27 | Discharge: 2019-05-28 | Disposition: A | Discharge status: Home / Self Care | Payer: Medicaid Other | Attending: Emergency Medicine | Admitting: Emergency Medicine

## 2019-05-27 ENCOUNTER — Other Ambulatory Visit: Payer: Self-pay

## 2019-05-27 DIAGNOSIS — R569 Unspecified convulsions: Secondary | ICD-10-CM | POA: Diagnosis present

## 2019-05-27 DIAGNOSIS — F17210 Nicotine dependence, cigarettes, uncomplicated: Secondary | ICD-10-CM | POA: Insufficient documentation

## 2019-05-27 LAB — CBC
HCT: 40.1 % (ref 39.0–52.0)
Hemoglobin: 13.9 g/dL (ref 13.0–17.0)
MCH: 31.9 pg (ref 26.0–34.0)
MCHC: 34.7 g/dL (ref 30.0–36.0)
MCV: 92 fL (ref 80.0–100.0)
Platelets: 192 10*3/uL (ref 150–400)
RBC: 4.36 MIL/uL (ref 4.22–5.81)
RDW: 12.5 % (ref 11.5–15.5)
WBC: 8.4 10*3/uL (ref 4.0–10.5)
nRBC: 0 % (ref 0.0–0.2)

## 2019-05-27 LAB — BASIC METABOLIC PANEL
Anion gap: 12 (ref 5–15)
BUN: 10 mg/dL (ref 6–20)
CO2: 21 mmol/L — ABNORMAL LOW (ref 22–32)
Calcium: 9.9 mg/dL (ref 8.9–10.3)
Chloride: 105 mmol/L (ref 98–111)
Creatinine, Ser: 1.2 mg/dL (ref 0.61–1.24)
GFR calc Af Amer: >60 mL/min (ref >60)
GFR calc non Af Amer: >60 mL/min (ref >60)
Glucose, Bld: 110 mg/dL — ABNORMAL HIGH (ref 70–99)
Potassium: 3.5 mmol/L (ref 3.5–5.1)
Sodium: 138 mmol/L (ref 135–145)

## 2019-05-27 LAB — VALPROIC ACID LEVEL: Valproic Acid Lvl: 75 ug/mL (ref 50.0–100.0)

## 2019-05-27 MED ORDER — DIVALPROEX SODIUM ER 250 MG PO TB24
1000.0000 mg | ORAL_TABLET | ORAL | Status: AC
  Administered 2019-05-27: 20:00:00 1000 mg via ORAL
  Filled 2019-05-27: qty 4

## 2019-05-27 MED ORDER — IBUPROFEN 800 MG PO TABS
800.0000 mg | ORAL_TABLET | ORAL | Status: AC
  Administered 2019-05-27: 800 mg via ORAL
  Filled 2019-05-27: qty 1

## 2019-05-27 NOTE — ED Notes (Signed)
Po meds given patient tolerated well. Sz precautions maintained.

## 2019-05-27 NOTE — ED Provider Notes (Signed)
[facility] Emergency Department Provider Note   ____________________________________________   First MD Initiated Contact with Patient 05/27/19 1933     (approximate)  I have reviewed the triage vital signs and the nursing notes.   HISTORY  Chief Complaint Seizures    HPI Daniel Craig is a 28 y.o. male here for evaluation of seizure  Patient reports he hit his life alert button because he felt a seizure coming on.  He woke up around the time EMS arrived.  He reports he knows he had a "grand mal" seizure.  He had his typical symptoms.  He is not had any fevers or chills no cough or no exposure to coronavirus.  He has several seizures each month, he tells me that he forgot to take his seizure medication this morning and that was probably the cause for his seizure today  He has a slight headache.  Reports the symptoms happened many times in the past.  He often does not, he has a seizure unless he gets the aura that is going to have a "grand mal"  He denies injury.  He does report he had some stitches under his chin from a while ago he feels just slightly sore but not bleeding.  No nausea vomiting.  No recent illness.  No chest pain or trouble breathing.    Past Medical History:  Diagnosis Date  . Schizophrenia ([facility])   . Seizures [facility])     Patient Active Problem List   Diagnosis Date Noted  . Hypoglycemia 11/09/2018  . Schizophrenia, paranoid ([facility]) 02/27/2018  . Cannabis use disorder, moderate, dependence ([facility]) 02/27/2018  . Tobacco use disorder 02/27/2018  . 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 Altered mental status 12/28/2017  . Fever 08/11/2017  . Acute URI 08/11/2017  . Delirium 06/25/2017  . Schizophrenia ([facility]) 06/25/2017  . Seizures ([facility]) 06/25/2017    Past Surgical History:  Procedure Laterality Date  . MOUTH SURGERY      Prior to Admission medications   Medication Sig Start Date End Date Taking? Authorizing Provider  Ascorbic  Acid (VITAMIN C) 1000 MG tablet Take 1,000 mg by mouth daily.    [provider]  divalproex (DEPAKOTE ER) 500 MG 24 hr tablet Take 500-1,000 mg by mouth See admin instructions. 500 mg every morning and 1000 mg at bedtime    [provider]  lamoTRIgine (LAMICTAL) 100 MG tablet Take 100-200 mg by mouth See admin instructions. 100 mg every morning and 200 mg at bedtime    [provider]  LORazepam (ATIVAN) 1 MG tablet Take 1 tablet (1 mg total) by mouth 2 (two) times daily as needed for seizure (or shaking). 01/06/17   Jennye MoccasinQuigley, Brian S, MD  meloxicam (MOBIC) 15 MG tablet Take 1 tablet (15 mg total) by mouth daily as needed for pain. 11/11/18   Enid BaasKalisetti, Radhika, MD  OLANZapine (ZYPREXA) 5 MG tablet Take 5 mg by mouth at bedtime.     [provider]  propranolol ER (INDERAL LA) 60 MG 24 hr capsule Take 60 mg by mouth daily.    [provider]  risperiDONE (RISPERDAL) 3 MG tablet Take 6 mg by mouth Nightly. 10/01/16   [provider]  Vitamin D, Ergocalciferol, (DRISDOL) 50000 units CAPS capsule Take 50,000 Units by mouth every 7 (seven) days.    [provider]    Allergies Keppra [levetiracetam], Trileptal [oxcarbazepine], and Vimpat [lacosamide]  Family History  Family history unknown: Yes    Social  History Social History   Tobacco Use  . Smoking status: Current Every Day Smoker    Packs/day: 1.00    Types: Cigarettes  . Smokeless tobacco: Never Used  Substance Use Topics  . Alcohol use: Yes    Alcohol/week: 4.0 standard drinks    Types: 4 Shots of liquor per week    Comment: pt states 4 shots of liqour oer week  . Drug use: Yes    Types: Marijuana    Review of Systems Constitutional: No fever/chills Eyes: No visual changes. ENT: No sore throat. Cardiovascular: Denies chest pain. Respiratory: Denies shortness of breath. Gastrointestinal: No abdominal pain.   Musculoskeletal: Achy across many of his muscles Skin:  Negative for rash. Neurological: Negative for headaches, areas of focal weakness or numbness.  Positive for having a seizure. Psychiatric: No hallucinations.    ____________________________________________   PHYSICAL EXAM:  VITAL SIGNS: ED Triage Vitals  Enc Vitals Group     BP 05/27/19 1930 120/70     Pulse Rate 05/27/19 1930 68     Resp 05/27/19 1930 18     Temp 05/27/19 1930 97.6 F (36.4 C)     Temp Source 05/27/19 1930 Oral     SpO2 05/27/19 1930 96 %     Weight 05/27/19 1933 211 lb (95.7 kg)     Height 05/27/19 1933 6' (1.829 m)     Head Circumference --      Peak Flow --      Pain Score 05/27/19 1933 0     Pain Loc --      Pain Edu? --      Excl. in GC? --     Constitutional: Alert and oriented. Well appearing and in no acute distress.  He is resting comfortably, alerts conversant. Eyes: Conjunctivae are normal. Head: Atraumatic. Nose: No congestion/rhinnorhea. Mouth/Throat: Mucous membranes are moist. Neck: No stridor.  Cardiovascular: Normal rate, regular rhythm. Grossly normal heart sounds.  Good peripheral circulation. Respiratory: Normal respiratory effort.  No retractions. Lungs CTAB. Gastrointestinal: Soft and nontender. No distention. Musculoskeletal: No lower extremity tenderness nor edema. Neurologic:  Normal speech and language. No gross focal neurologic deficits are appreciated.  Skin:  Skin is warm, dry and intact. No rash noted. Psychiatric: Mood and affect are normal to calm. Speech and behavior are normal.  ____________________________________________   LABS (all labs ordered are listed, but only abnormal results are displayed)  Labs Reviewed  BASIC METABOLIC PANEL - Abnormal; Notable for the following components:      Result Value   CO2 21 (*)    Glucose, Bld 110 (*)    All other components within normal limits  VALPROIC ACID LEVEL  CBC   ____________________________________________  EKG    ____________________________________________  RADIOLOGY   ____________________________________________   PROCEDURES  Procedure(s) performed: None  Procedures  Critical Care performed: No  ____________________________________________   INITIAL IMPRESSION / ASSESSMENT AND PLAN / ED COURSE  Pertinent labs & imaging results that were available during my care of the patient were reviewed by me and considered in my medical decision making (see chart for details).   Patient presents for evaluation after seizure.  Does have a known seizure history.  On arrival to the ER he is awake alert and conversant.  He does report muscle aches and some headache but appears overall well with normal vital signs.  Does not have ongoing postictal state at this time and does report missing a dose of his seizure medicine.  I have ordered  his evening dose of his medication, and will await lab work.  At the present time he is calm and resting comfortably    ----------------------------------------- 11:15 PM on 05/27/2019 -----------------------------------------  Patient fully awake and alert.  Discussed with his parent.  Patient will be discharged, he reports that he is not always compliant with his medications.  Encourage compliance as I suspect this is likely the cause of his seizure today.  Patient agreement, awake alert no distress.  Comfortable with plan for discharge  Return precautions and treatment recommendations and follow-up discussed with the patient who is agreeable with the plan.   ____________________________________________   FINAL CLINICAL IMPRESSION(S) / ED DIAGNOSES  Final diagnoses:  Seizure ([place])        Note:  This document was prepared using Dragon voice recognition software and may include unintentional dictation errors       Delman Kitten, MD 05/27/19 2316

## 2019-05-27 NOTE — ED Triage Notes (Signed)
Hx of sx, as per ems patient has sx often. postictal upon arrival. No meds given in route to ed.

## 2019-05-27 NOTE — Discharge Instructions (Addendum)

## 2019-05-27 NOTE — ED Notes (Signed)
Patient sitting up in bed watching TV, denies pain or concerns. No sz activity noted. Awaiting disposition.

## 2019-05-27 NOTE — ED Notes (Signed)
No sz activity noted. Patient awakens to name. Reports feeling tired and groogy. Vss. Safety maintained. Will monitor.

## 2019-05-28 NOTE — ED Notes (Signed)
E-signature pad not working. Pt educated about discharge instructions and verbalized understanding. Pt signed paper copy.

## 2019-05-28 NOTE — ED Notes (Signed)
Called pts father (legal guardian) to inform him of discharge at [phone]. Pts father, Daniel Craig, stated he lived over 1 hour away and was not able to come pick him up. He gave verbal permission for his son to take a cab home.

## 2019-05-28 NOTE — ED Notes (Addendum)
Pt provided cab voucher, [place] [facility] cab called and stated it would be approximately an hour. Pt notified and agreed to wait in lobby for cab arrival. Pt discharged to lobby. First Nurse notified.

## 2019-05-28 NOTE — ED Notes (Signed)
Received verbal permission from patient to speak to her daughter.

## 2019-05-29 ENCOUNTER — Other Ambulatory Visit: Payer: Self-pay

## 2019-05-29 ENCOUNTER — Encounter: Payer: Self-pay | Admitting: Emergency Medicine

## 2019-05-29 ENCOUNTER — Emergency Department
Admission: EM | Admit: 2019-05-29 | Discharge: 2019-05-31 | Disposition: A | Discharge status: Home / Self Care | Payer: Medicaid Other | Attending: Emergency Medicine | Admitting: Emergency Medicine

## 2019-05-29 DIAGNOSIS — G40909 Epilepsy, unspecified, not intractable, without status epilepticus: Secondary | ICD-10-CM

## 2019-05-29 DIAGNOSIS — Z79899 Other long term (current) drug therapy: Secondary | ICD-10-CM | POA: Insufficient documentation

## 2019-05-29 DIAGNOSIS — F17210 Nicotine dependence, cigarettes, uncomplicated: Secondary | ICD-10-CM | POA: Insufficient documentation

## 2019-05-29 DIAGNOSIS — R569 Unspecified convulsions: Secondary | ICD-10-CM | POA: Diagnosis not present

## 2019-05-29 DIAGNOSIS — F200 Paranoid schizophrenia: Secondary | ICD-10-CM | POA: Diagnosis present

## 2019-05-29 DIAGNOSIS — F259 Schizoaffective disorder, unspecified: Secondary | ICD-10-CM

## 2019-05-29 LAB — AMMONIA: Ammonia: 25 umol/L (ref 9–35)

## 2019-05-29 LAB — HEPATIC FUNCTION PANEL
ALT: 14 U/L (ref 0–44)
AST: 31 U/L (ref 15–41)
Albumin: 4.1 g/dL (ref 3.5–5.0)
Alkaline Phosphatase: 33 U/L — ABNORMAL LOW (ref 38–126)
Bilirubin, Direct: 0.2 mg/dL (ref 0.0–0.2)
Indirect Bilirubin: 0.6 mg/dL (ref 0.3–0.9)
Total Bilirubin: 0.8 mg/dL (ref 0.3–1.2)
Total Protein: 7.9 g/dL (ref 6.5–8.1)

## 2019-05-29 LAB — CBC
HCT: 41.6 % (ref 39.0–52.0)
Hemoglobin: 14.4 g/dL (ref 13.0–17.0)
MCH: 31.7 pg (ref 26.0–34.0)
MCHC: 34.6 g/dL (ref 30.0–36.0)
MCV: 91.6 fL (ref 80.0–100.0)
Platelets: 177 10*3/uL (ref 150–400)
RBC: 4.54 MIL/uL (ref 4.22–5.81)
RDW: 12.3 % (ref 11.5–15.5)
WBC: 12.1 10*3/uL — ABNORMAL HIGH (ref 4.0–10.5)
nRBC: 0 % (ref 0.0–0.2)

## 2019-05-29 LAB — BASIC METABOLIC PANEL
Anion gap: 10 (ref 5–15)
BUN: 11 mg/dL (ref 6–20)
CO2: 21 mmol/L — ABNORMAL LOW (ref 22–32)
Calcium: 9.5 mg/dL (ref 8.9–10.3)
Chloride: 106 mmol/L (ref 98–111)
Creatinine, Ser: 1.03 mg/dL (ref 0.61–1.24)
GFR calc Af Amer: >60 mL/min (ref >60)
GFR calc non Af Amer: >60 mL/min (ref >60)
Glucose, Bld: 101 mg/dL — ABNORMAL HIGH (ref 70–99)
Potassium: 3.5 mmol/L (ref 3.5–5.1)
Sodium: 137 mmol/L (ref 135–145)

## 2019-05-29 LAB — ETHANOL: Alcohol, Ethyl (B): <10 mg/dL (ref <10)

## 2019-05-29 LAB — VALPROIC ACID LEVEL: Valproic Acid Lvl: 91 ug/mL (ref 50.0–100.0)

## 2019-05-29 MED ORDER — LORAZEPAM 2 MG/ML IJ SOLN
INTRAMUSCULAR | Status: AC
  Administered 2019-05-29: 2 mg via INTRAVENOUS
  Filled 2019-05-29: qty 1

## 2019-05-29 MED ORDER — DIVALPROEX SODIUM ER 250 MG PO TB24: 500.0000 mg | ORAL_TABLET | ORAL | Status: DC

## 2019-05-29 MED ORDER — MELOXICAM 7.5 MG PO TABS
15.0000 mg | ORAL_TABLET | Freq: Every day | ORAL | Status: DC
  Filled 2019-05-29 (×2): qty 2

## 2019-05-29 MED ORDER — PROPRANOLOL HCL ER 60 MG PO CP24
60.0000 mg | ORAL_CAPSULE | Freq: Every day | ORAL | Status: DC
  Administered 2019-05-31: 60 mg via ORAL
  Filled 2019-05-29 (×4): qty 1

## 2019-05-29 MED ORDER — LAMOTRIGINE 100 MG PO TABS: 100.0000 mg | ORAL_TABLET | ORAL | Status: DC

## 2019-05-29 MED ORDER — RISPERIDONE 3 MG PO TABS
6.0000 mg | ORAL_TABLET | Freq: Every evening | ORAL | Status: DC
  Administered 2019-05-29 – 2019-05-30 (×2): 6 mg via ORAL
  Filled 2019-05-29: qty 6
  Filled 2019-05-29: qty 2

## 2019-05-29 MED ORDER — LORAZEPAM 2 MG/ML IJ SOLN
2.0000 mg | Freq: Once | INTRAMUSCULAR | Status: AC
  Administered 2019-05-29: 2 mg via INTRAVENOUS

## 2019-05-29 MED ORDER — OLANZAPINE 5 MG PO TABS
5.0000 mg | ORAL_TABLET | Freq: Every day | ORAL | Status: DC
  Administered 2019-05-29 – 2019-05-30 (×2): 5 mg via ORAL
  Filled 2019-05-29 (×2): qty 1

## 2019-05-29 NOTE — ED Provider Notes (Signed)
[facility] Emergency Department Provider Note  ____________________________________________   First MD Initiated Contact with Patient 05/29/19 2127     (approximate)  I have reviewed the triage vital signs and the nursing notes.   HISTORY  Chief Complaint Seizures    HPI Daniel Craig is a 28 y.o. male with history of schizophrenia, seizure disorder, here with seizure.  The patient reportedly had a witnessed, generalized tonic-clonic seizure lasting less than 5 minutes.  He is now back to his baseline.  However, on discussion with family, they state that he has been hyperreligious and stating that he is [employer] lately.  They are not sure if he has been taking his medications.  They are concerned that he is acutely decompensated.  On my assessment, he is evasive, occasionally responding to internal stimuli, and appears somewhat combative.  He is initially stated he had taken his medications but then stated that he had maybe missed "1 or 2 doses."  Denies any pain currently.        Past Medical History:  Diagnosis Date  . Schizophrenia ([place])   . Seizures [facility])     Patient Active Problem List   Diagnosis Date Noted  . Hypoglycemia 11/09/2018  . Schizophrenia, paranoid ([place]) 02/27/2018  . Cannabis use disorder, moderate, dependence ([place]) 02/27/2018  . Tobacco use disorder 02/27/2018  . 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 Altered mental status 12/28/2017  . Fever 08/11/2017  . Acute URI 08/11/2017  . Delirium 06/25/2017  . Schizophrenia ([place]) 06/25/2017  . Seizures ([place]) 06/25/2017    Past Surgical History:  Procedure Laterality Date  . MOUTH SURGERY      Prior to Admission medications   Medication Sig Start Date End Date Taking? Authorizing Provider  Ascorbic Acid (VITAMIN C) 1000 MG tablet Take 1,000 mg by mouth daily.    [provider]  divalproex (DEPAKOTE ER) 500 MG 24 hr tablet Take 500-1,000 mg by mouth See admin  instructions. 500 mg every morning and 1000 mg at bedtime    [provider]  lamoTRIgine (LAMICTAL) 100 MG tablet Take 100-200 mg by mouth See admin instructions. 100 mg every morning and 200 mg at bedtime    [provider]  LORazepam (ATIVAN) 1 MG tablet Take 1 tablet (1 mg total) by mouth 2 (two) times daily as needed for seizure (or shaking). 01/06/17   Daymon Larsen, MD  meloxicam (MOBIC) 15 MG tablet Take 1 tablet (15 mg total) by mouth daily as needed for pain. 11/11/18   [place] Lighter, MD  OLANZapine (ZYPREXA) 5 MG tablet Take 5 mg by mouth at bedtime.     [provider]  propranolol ER (INDERAL LA) 60 MG 24 hr capsule Take 60 mg by mouth daily.    [provider]  risperiDONE (RISPERDAL) 3 MG tablet Take 6 mg by mouth Nightly. 10/01/16   [provider]  Vitamin D, Ergocalciferol, (DRISDOL) 50000 units CAPS capsule Take 50,000 Units by mouth every 7 (seven) days.    [provider]    Allergies Keppra [levetiracetam], Trileptal [oxcarbazepine], and Vimpat [lacosamide]  Family History  Family history unknown: Yes    Social History Social History   Tobacco Use  . Smoking status: Current Every Day Smoker    Packs/day: 1.00    Types: Cigarettes  . Smokeless tobacco: Never Used  Substance Use Topics  . Alcohol use: Yes    Alcohol/week: 4.0 standard drinks    Types: 4 Shots  of liquor per week    Comment: pt states 4 shots of liqour oer week  . Drug use: Yes    Types: Marijuana    Review of Systems  Review of Systems  Constitutional: Negative for chills, fatigue and fever.  HENT: Negative for sore throat.   Respiratory: Negative for shortness of breath.   Cardiovascular: Negative for chest pain.  Gastrointestinal: Negative for abdominal pain.  Genitourinary: Negative for flank pain.  Musculoskeletal: Negative for neck pain.  Skin: Negative for rash and wound.  Allergic/Immunologic: Negative for  immunocompromised state.  Neurological: Positive for seizures. Negative for weakness and numbness.  Hematological: Does not bruise/bleed easily.  Psychiatric/Behavioral: Positive for agitation and confusion.     ____________________________________________  PHYSICAL EXAM:      VITAL SIGNS: ED Triage Vitals  Enc Vitals Group     BP 05/29/19 2118 133/77     Pulse Rate 05/29/19 2118 83     Resp 05/29/19 2118 (!) 23     Temp 05/29/19 2118 99 F (37.2 C)     Temp Source 05/29/19 2118 Oral     SpO2 05/29/19 2118 97 %     Weight 05/29/19 2119 210 lb (95.3 kg)     Height 05/29/19 2119 6' (1.829 m)     Head Circumference --      Peak Flow --      Pain Score 05/29/19 2119 0     Pain Loc --      Pain Edu? --      Excl. in GC? --      Physical Exam Vitals signs and nursing note reviewed.  Constitutional:      General: He is not in acute distress.    Appearance: He is well-developed.  HENT:     Head: Normocephalic and atraumatic.  Eyes:     Conjunctiva/sclera: Conjunctivae normal.  Neck:     Musculoskeletal: Neck supple.  Cardiovascular:     Rate and Rhythm: Normal rate and regular rhythm.     Heart sounds: Normal heart sounds. No murmur. No friction rub.  Pulmonary:     Effort: Pulmonary effort is normal. No respiratory distress.     Breath sounds: Normal breath sounds. No wheezing or rales.  Abdominal:     General: There is no distension.     Palpations: Abdomen is soft.     Tenderness: There is no abdominal tenderness.  Skin:    General: Skin is warm.     Capillary Refill: Capillary refill takes less than 2 seconds.  Neurological:     Mental Status: He is alert and oriented to person, place, and time.     Motor: No abnormal muscle tone.       ____________________________________________   LABS (all labs ordered are listed, but only abnormal results are displayed)  Labs Reviewed  BASIC METABOLIC PANEL - Abnormal; Notable for the following components:       Result Value   CO2 21 (*)    Glucose, Bld 101 (*)    All other components within normal limits  CBC - Abnormal; Notable for the following components:   WBC 12.1 (*)    All other components within normal limits  HEPATIC FUNCTION PANEL - Abnormal; Notable for the following components:   Alkaline Phosphatase 33 (*)    All other components within normal limits  VALPROIC ACID LEVEL  AMMONIA  ETHANOL   ____________________________________________  EKG:  ________________________________________  RADIOLOGY All imaging, including plain films, CT scans, and  ultrasounds, independently reviewed by me, and interpretations confirmed via formal radiology reads.  ED MD interpretation:     Official radiology report(s): No results found.  ____________________________________________  PROCEDURES   Procedure(s) performed (including Critical Care):  Procedures  ____________________________________________  INITIAL IMPRESSION / MDM / ASSESSMENT AND PLAN / ED COURSE  As part of my medical decision making, I reviewed the following data within the electronic MEDICAL RECORD NUMBER Notes from prior ED visits and [place] Controlled Substance Database      *Merrilee JanskyKeon Evin Hanssen was evaluated in Emergency Department on 05/29/2019 for the symptoms described in the history of present illness. He was evaluated in the context of the global COVID-19 pandemic, which necessitated consideration that the patient might be at risk for infection with the SARS-CoV-2 virus that causes COVID-19. Institutional protocols and algorithms that pertain to the evaluation of patients at risk for COVID-19 are in a state of rapid change based on information released by regulatory bodies including the CDC and federal and state organizations. These policies and algorithms were followed during the patient's care in the ED.  Some ED evaluations and interventions may be delayed as a result of limited staffing during the pandemic.*       Medical Decision Making: 28 year old male here with witnessed seizure.  Patient has a known seizure disorder with frequent seizures.  Depakote level is actually therapeutic here, will give dose of Ativan.  Otherwise, per discussion with family, the patient has been hyperreligious and confused lately.  He is occasionally responding to internal stimuli on my exam.  Given concern for decompensated schizophrenia, will place the patient under IVC for now in case he tries to leave, as family is concerned about his safety and ability to care for himself.  Will consult TTS and psychiatry.  ____________________________________________  FINAL CLINICAL IMPRESSION(S) / ED DIAGNOSES  Final diagnoses:  Seizure (HCC)     MEDICATIONS GIVEN DURING THIS VISIT:  Medications  LORazepam (ATIVAN) injection 2 mg (2 mg Intravenous Given 05/29/19 2159)     ED Discharge Orders    None       Note:  This document was prepared using Dragon voice recognition software and may include unintentional dictation errors.   Shaune PollackIsaacs, Yocelyn Brocious, MD 05/29/19 [phone]

## 2019-05-29 NOTE — ED Notes (Signed)
Pt dressed out in hospital provided attire and pt's gray tank top, black underwear, black shorts, black socks, black shoes, and keys on necklace removed.

## 2019-05-29 NOTE — ED Triage Notes (Signed)
Pt to ED via EMS from home for seizure like activity.  Was walking around a parking lot when bystanders noticed fall and seizure.  Has hx of seizures, takes depakote and staets compliant with.  Has had recent seizures in last couple days.  EMS vitals 99% RA, 80 HR, 110/70 BP, 106 cbg.

## 2019-05-30 MED ORDER — DIVALPROEX SODIUM ER 500 MG PO TB24
500.0000 mg | ORAL_TABLET | Freq: Every day | ORAL | Status: DC
  Administered 2019-05-31: 500 mg via ORAL
  Filled 2019-05-30 (×2): qty 1

## 2019-05-30 MED ORDER — LAMOTRIGINE 100 MG PO TABS
100.0000 mg | ORAL_TABLET | Freq: Every day | ORAL | Status: DC
  Administered 2019-05-30 – 2019-05-31 (×2): 100 mg via ORAL
  Filled 2019-05-30 (×2): qty 1

## 2019-05-30 MED ORDER — LAMOTRIGINE 100 MG PO TABS
200.0000 mg | ORAL_TABLET | Freq: Every day | ORAL | Status: DC
  Administered 2019-05-30: 200 mg via ORAL
  Filled 2019-05-30: qty 2

## 2019-05-30 MED ORDER — DIVALPROEX SODIUM ER 500 MG PO TB24
1000.0000 mg | ORAL_TABLET | Freq: Every day | ORAL | Status: DC
  Administered 2019-05-30: 1000 mg via ORAL
  Filled 2019-05-30: qty 2

## 2019-05-30 MED ORDER — LORAZEPAM 1 MG PO TABS
1.0000 mg | ORAL_TABLET | Freq: Once | ORAL | Status: AC
  Administered 2019-05-30: 16:00:00 1 mg via ORAL
  Filled 2019-05-30: qty 1

## 2019-05-30 MED ORDER — DIVALPROEX SODIUM 500 MG PO DR TAB: 500.0000 mg | DELAYED_RELEASE_TABLET | Freq: Every day | ORAL | Status: DC

## 2019-05-30 NOTE — ED Notes (Signed)
Pt upset he has to spend the night here and be re-evaluated in the morning. Pt asking staff to unlock the doors for him. Pt is speaking to his father on the phone now. Maintained on 15 minute checks.

## 2019-05-30 NOTE — ED Provider Notes (Signed)
Went to speak to the patient at the pateint's request. Did try to discuss with the patient that it will be up to the psychiatrist to determine what the safest course of action is. Patient became upset.    Nance Pear, MD 05/30/19 [phone]

## 2019-05-30 NOTE — ED Notes (Signed)
Pt to nurses station stating he feels like he is having a "mini seizure."  Pt alert and oriented. VS stable. EDP made aware. 1 mg PO  Ativan given as ordered.

## 2019-05-30 NOTE — ED Notes (Signed)
Pt is currently still asleep. Pt did talk to this tech whenever vital signs were taken.

## 2019-05-30 NOTE — ED Notes (Signed)
SOC in progress.  
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
## 2019-05-30 NOTE — ED Notes (Signed)
Pt talking to another patient in the dayroom. Maintained on 15 minute checks and observation by security camera for safety.

## 2019-05-30 NOTE — ED Notes (Signed)
Pt. Introduced to unit.  Pt. Advised of cameras and safety checks.  Pt. Calm and cooperative at this time.  Pt. Requested and was given cup of water.

## 2019-05-30 NOTE — ED Notes (Signed)
Pt refusing all medications. On-coming nurse made aware.

## 2019-05-30 NOTE — ED Notes (Signed)
This RN in with patient and spoke with him about the need to take his medication to prevent future seizures. Pt at this time agreeable to take his medications.

## 2019-05-30 NOTE — ED Notes (Signed)
Pt continues to come to nurses station door asking to leave.  No behavioral issues at this time. Maintained on 15 minute checks and observation by security camera for safety.

## 2019-05-30 NOTE — ED Notes (Addendum)
Pt stating he is God. Pt demanding to leave. "If you won't help me I'll break the door down." Pt to sally port door attempting to leave.  Pt also refusing seizure medication. EDP made aware. Maintained on 15 minute checks and observation by security camera for safety.

## 2019-05-30 NOTE — ED Notes (Signed)
IVC, pending consult 

## 2019-05-31 DIAGNOSIS — F200 Paranoid schizophrenia: Secondary | ICD-10-CM

## 2019-05-31 NOTE — Consult Note (Addendum)
[facility]  05/31/2019 10:07 AM Daniel JanskyKeon Evin Craig  MRN:  [id number] Principal Problem: Schizophrenia, paranoid [facility](HCC) Discharge Diagnoses: Principal Problem:   Schizophrenia, paranoid (HCC)  Subjective: "I'm ready to go."  Denies suicidal/homicidal ideations, hallucinations, and substance abuse. No seizure activity since his initial presentation.  Discussed care plan to [facility].  His father is going to come and pick the patient up at discharge.  SOC completed at 111 am and recommended discharge.  Patient admitted to ED after a generalized tonic-clonic seizure lasting less than five minutes, witnessed.  Patient reported he was not dure if had taken his medications but may have missed one or two doses, hyperreligious on admission.  Medications were restarted and he stabilized.  No longer believes he is DTE Energy CompanyJesus Christ.  His hallucinations increase prior to a seizure and stabilize after the seizure and postictal stage.  Pleasant and cooperative, stable for discharge.  Collateral: The history is provided by the patients father(Ronnie Ocampo: [phone]):"II don't think has ever been aggressive. He reports that he bnelieves that the pt is taking his mediation. He continues to explain " his seizures have been acting up."  He reports that [name] has place the pt in an apartment and that hes lived alone for 2 years. He has  a Advertising copywritereer Support through [facility] [employer]([name]:[phone]).    Total Time spent with patient: one hour  Past Psychiatric History: schizophrenia  Past Medical History:  Past Medical History:  Diagnosis Date  . Schizophrenia (HCC)   . Seizures (HCC)     Past Surgical History:  Procedure Laterality Date  . MOUTH SURGERY     Family History:  Family History  Family history unknown: Yes   Family Psychiatric  History: none Social History:  Social History   Substance and Sexual Activity   Alcohol Use Yes  . Alcohol/week: 4.0 standard drinks  . Types: 4 Shots of liquor per week   Comment: pt states 4 shots of liqour oer week     Social History   Substance and Sexual Activity  Drug Use Yes  . Types: Marijuana    Social History   Socioeconomic History  . Marital status: Single    Spouse name: Not on file  . Number of children: Not on file  . Years of education: Not on file  . Highest education level: Not on file  Occupational History  . Not on file  Social Needs  . Financial resource strain: Not on file  . Food insecurity    Worry: Not on file    Inability: Not on file  . Transportation needs    Medical: Not on file    Non-medical: Not on file  Tobacco Use  . Smoking status: Current Every Day Smoker    Packs/day: 1.00    Types: Cigarettes  . Smokeless tobacco: Never Used  Substance and Sexual Activity  . Alcohol use: Yes    Alcohol/week: 4.0 standard drinks    Types: 4 Shots of liquor per week    Comment: pt states 4 shots of liqour oer week  . Drug use: Yes    Types: Marijuana  . Sexual activity: Not on file  Lifestyle  . Physical activity    Days per week: Not on file    Minutes per session: Not on file  . Stress: Not on file  Relationships  . Social connections    Talks on phone: Not on file  Gets together: Not on file    Attends religious service: Not on file    Active member of club or organization: Not on file    Attends meetings of clubs or organizations: Not on file    Relationship status: Not on file  Other Topics Concern  . Not on file  Social History Narrative  . Not on file    Has this patient used any form of tobacco in the last 30 days? (Cigarettes, Smokeless Tobacco, Cigars, and/or Pipes) NA  Current Medications: Current Facility-Administered Medications  Medication Dose Route Frequency Provider Last Rate Last Dose  . divalproex (DEPAKOTE ER) 24 hr tablet 1,000 mg  1,000 mg Oral QHS Charm RingsLord, Jamison Y, NP   1,000 mg at  05/30/19 2343  . divalproex (DEPAKOTE ER) 24 hr tablet 500 mg  500 mg Oral Daily Charm RingsLord, Jamison Y, NP   500 mg at 05/31/19 0911  . lamoTRIgine (LAMICTAL) tablet 100 mg  100 mg Oral Daily Charm RingsLord, Jamison Y, NP   100 mg at 05/31/19 0911  . lamoTRIgine (LAMICTAL) tablet 200 mg  200 mg Oral QHS Charm RingsLord, Jamison Y, NP   200 mg at 05/30/19 2342  . OLANZapine (ZYPREXA) tablet 5 mg  5 mg Oral QHS Shaune PollackIsaacs, Cameron, MD   5 mg at 05/30/19 2344  . propranolol ER (INDERAL LA) 24 hr capsule 60 mg  60 mg Oral Daily Shaune PollackIsaacs, Cameron, MD   60 mg at 05/31/19 0911  . risperiDONE (RISPERDAL) tablet 6 mg  6 mg Oral Nightly Shaune PollackIsaacs, Cameron, MD   6 mg at 05/30/19 2345   Current Outpatient Medications  Medication Sig Dispense Refill  . Ascorbic Acid (VITAMIN C) 1000 MG tablet Take 1,000 mg by mouth daily.    . divalproex (DEPAKOTE ER) 500 MG 24 hr tablet Take 500-1,000 mg by mouth See admin instructions. 500 mg every morning and 1000 mg at bedtime    . lamoTRIgine (LAMICTAL) 100 MG tablet Take 100-200 mg by mouth See admin instructions. 100 mg every morning and 200 mg at bedtime    . LORazepam (ATIVAN) 1 MG tablet Take 1 tablet (1 mg total) by mouth 2 (two) times daily as needed for seizure (or shaking). 10 tablet 0  . meloxicam (MOBIC) 15 MG tablet Take 1 tablet (15 mg total) by mouth daily as needed for pain.    Marland Kitchen. OLANZapine (ZYPREXA) 5 MG tablet Take 5 mg by mouth at bedtime.     . propranolol ER (INDERAL LA) 60 MG 24 hr capsule Take 60 mg by mouth daily.    . risperiDONE (RISPERDAL) 3 MG tablet Take 6 mg by mouth Nightly.    . Vitamin D, Ergocalciferol, (DRISDOL) 50000 units CAPS capsule Take 50,000 Units by mouth every 7 (seven) days.     PTA Medications: (Not in a hospital admission)   Musculoskeletal: Strength & Muscle Tone: within normal limits Gait & Station: normal Patient leans: N/A  Psychiatric Specialty Exam: Physical Exam  Nursing note and vitals reviewed. Constitutional: He is oriented to person,  place, and time. He appears well-developed and well-nourished.  HENT:  Head: Normocephalic.  Neck: Normal range of motion.  Respiratory: Effort normal.  Musculoskeletal: Normal range of motion.  Neurological: He is alert and oriented to person, place, and time.  Psychiatric: His speech is normal and behavior is normal. Judgment and thought content normal. His mood appears anxious. His affect is blunt. Cognition and memory are normal.    Review of Systems  Psychiatric/Behavioral: The patient  is nervous/anxious.   All other systems reviewed and are negative.   Blood pressure 133/80, pulse (!) 107, temperature 98.8 F (37.1 C), temperature source Oral, resp. rate 18, height 6' (1.829 m), weight 95.3 kg, SpO2 99 %.Body mass index is 28.48 kg/m.  General Appearance: Casual  Eye Contact:  Good  Speech:  Normal Rate  Volume:  Normal  Mood:  Anxious  Affect:  Blunt  Thought Process:  Coherent and Descriptions of Associations: Intact  Orientation:  Full (Time, Place, and Person)  Thought Content:  WDL and Logical  Suicidal Thoughts:  No  Homicidal Thoughts:  No  Memory:  Immediate;   Good Recent;   Good Remote;   Good  Judgement:  Fair  Insight:  Fair  Psychomotor Activity:  Normal  Concentration:  Concentration: Good and Attention Span: Good  Recall:  Good  Fund of Knowledge:  Good  Language:  Good  Akathisia:  No  Handed:  Right  AIMS (if indicated):     Assets:  Housing Leisure Time Physical Health Resilience Social Support  ADL's:  Intact  Cognition:  WNL  Sleep:        Demographic Factors:  Male, Adolescent or young adult and Living alone  Loss Factors: NA  Historical Factors: NA  Risk Reduction Factors:   Sense of responsibility to family, Positive social support and Positive therapeutic relationship  Continued Clinical Symptoms:  Anxiety, mild  Cognitive Features That Contribute To Risk:  None    Suicide Risk:  Minimal: No identifiable suicidal  ideation.  Patients presenting with no risk factors but with morbid ruminations; may be classified as minimal risk based on the severity of the depressive symptoms  Follow-up Information    Call  [facility].   Why: for a follow up appointment Contact information: [place] [address] [phone]        Schedule an appointment as soon as possible for a visit  with Talty.   Why: for a follow up appointment, As needed Contact information: [place] [address] [phone]           Plan Of Care/Follow-up recommendations:  Schizophrenia, paranoid type: -Continued Zyprexa 5 mg at bedtime -Continued Risperdal 6 mg at bedtime  Seizures: -Continued Depakote 500 mg in the am and 1000 mg in the pm -Continued Lamictal 100 mg in the am and 200 mg in the pm -Continued Ativan 1 mg BID PRN  Activity:  as tolerated Diet:  heart healthy diet  Disposition: discharge to his father Waylan Boga, NP 05/31/2019, 10:07 AM

## 2019-05-31 NOTE — ED Notes (Signed)
This RN attempted to contact Tama Headings patients father and legal guardian. No answer. HIPAA compliant voice mail left to return call.

## 2019-05-31 NOTE — ED Notes (Signed)
Pt verbalized understanding of discharge instructions. Pt picked up by father (legal guardian) Pt is NAD at this time.

## 2019-05-31 NOTE — ED Provider Notes (Signed)
-----------------------------------------   1:11 AM on 05/31/2019 -----------------------------------------   Blood pressure 134/88, pulse 96, temperature 98.6 F (37 C), temperature source Oral, resp. rate 18, height 1.829 m (6'), weight 95.3 kg, SpO2 100 %.  Read the telepsychiatry report.  Psychiatrist feels the patient does not meet IVC nor inpatient psychiatric treatment criteria in spite of chronic schizoaffective disorder.  Does not require inpatient medical treatment as per Dr. Gennette Pac evaluation.  Will discharge for outpatient follow up.  ED RN to contact patient's legal guardian.   Hinda Kehr, MD 05/31/19 [phone]

## 2019-05-31 NOTE — Progress Notes (Signed)
Patient was somnolent, would not awaken even when covers removed from his head and gently tapped while calling his name.  He never moved.  His family was concerned he was hyperreligious.  Seizure the night before and decided to let him sleep until more responsive, also want to keep him to restart his medications prior to discharge.  He missed a couple of days.  Waylan Boga, PMHNP

## 2019-05-31 NOTE — Discharge Instructions (Signed)
You were evaluated both medically and psychiatrically tonight and found to be safe to return home.  Please take your medications as prescribed and follow up with your regular doctor and psychiatrist.  Avoid drugs and alcohol.  Return to the Emergency Department with new or worsening symptoms that concern you.

## 2019-05-31 NOTE — ED Notes (Signed)
This RN contacted pt's father about discharge. Pt's father will be picking him up.

## 2019-06-02 ENCOUNTER — Emergency Department
Admission: EM | Admit: 2019-06-02 | Discharge: 2019-06-02 | Disposition: A | Discharge status: Other Acute Inpatient Hospital | Payer: Medicaid Other | Attending: Emergency Medicine | Admitting: Emergency Medicine

## 2019-06-02 ENCOUNTER — Other Ambulatory Visit: Payer: Self-pay

## 2019-06-02 DIAGNOSIS — Z79899 Other long term (current) drug therapy: Secondary | ICD-10-CM | POA: Insufficient documentation

## 2019-06-02 DIAGNOSIS — F209 Schizophrenia, unspecified: Secondary | ICD-10-CM

## 2019-06-02 DIAGNOSIS — F1210 Cannabis abuse, uncomplicated: Secondary | ICD-10-CM | POA: Insufficient documentation

## 2019-06-02 DIAGNOSIS — F17210 Nicotine dependence, cigarettes, uncomplicated: Secondary | ICD-10-CM | POA: Diagnosis not present

## 2019-06-02 DIAGNOSIS — Z20828 Contact with and (suspected) exposure to other viral communicable diseases: Secondary | ICD-10-CM | POA: Diagnosis not present

## 2019-06-02 DIAGNOSIS — R440 Auditory hallucinations: Secondary | ICD-10-CM | POA: Diagnosis present

## 2019-06-02 LAB — BASIC METABOLIC PANEL
Anion gap: 9 (ref 5–15)
BUN: 8 mg/dL (ref 6–20)
CO2: 23 mmol/L (ref 22–32)
Calcium: 9.1 mg/dL (ref 8.9–10.3)
Chloride: 107 mmol/L (ref 98–111)
Creatinine, Ser: 0.93 mg/dL (ref 0.61–1.24)
GFR calc Af Amer: >60 mL/min (ref >60)
GFR calc non Af Amer: >60 mL/min (ref >60)
Glucose, Bld: 124 mg/dL — ABNORMAL HIGH (ref 70–99)
Potassium: 3.6 mmol/L (ref 3.5–5.1)
Sodium: 139 mmol/L (ref 135–145)

## 2019-06-02 LAB — CBC WITH DIFFERENTIAL/PLATELET
Abs Immature Granulocytes: 0.02 10*3/uL (ref 0.00–0.07)
Basophils Absolute: 0 10*3/uL (ref 0.0–0.1)
Basophils Relative: 1 %
Eosinophils Absolute: 0.1 10*3/uL (ref 0.0–0.5)
Eosinophils Relative: 1 %
HCT: 40.5 % (ref 39.0–52.0)
Hemoglobin: 14 g/dL (ref 13.0–17.0)
Immature Granulocytes: 0 %
Lymphocytes Relative: 61 %
Lymphs Abs: 4.8 10*3/uL — ABNORMAL HIGH (ref 0.7–4.0)
MCH: 31.3 pg (ref 26.0–34.0)
MCHC: 34.6 g/dL (ref 30.0–36.0)
MCV: 90.6 fL (ref 80.0–100.0)
Monocytes Absolute: 1 10*3/uL (ref 0.1–1.0)
Monocytes Relative: 13 %
Neutro Abs: 1.9 10*3/uL (ref 1.7–7.7)
Neutrophils Relative %: 24 %
Platelets: 208 10*3/uL (ref 150–400)
RBC: 4.47 MIL/uL (ref 4.22–5.81)
RDW: 12.3 % (ref 11.5–15.5)
WBC: 7.8 10*3/uL (ref 4.0–10.5)
nRBC: 0 % (ref 0.0–0.2)

## 2019-06-02 LAB — URINE DRUG SCREEN, QUALITATIVE (ARMC ONLY)
Amphetamines, Ur Screen: NOT DETECTED
Barbiturates, Ur Screen: NOT DETECTED
Benzodiazepine, Ur Scrn: NOT DETECTED
Cannabinoid 50 Ng, Ur ~~LOC~~: POSITIVE — AB
Cocaine Metabolite,Ur ~~LOC~~: NOT DETECTED
MDMA (Ecstasy)Ur Screen: NOT DETECTED
Methadone Scn, Ur: NOT DETECTED
Opiate, Ur Screen: NOT DETECTED
Phencyclidine (PCP) Ur S: NOT DETECTED
Tricyclic, Ur Screen: NOT DETECTED

## 2019-06-02 LAB — VALPROIC ACID LEVEL: Valproic Acid Lvl: 72 ug/mL (ref 50.0–100.0)

## 2019-06-02 LAB — SALICYLATE LEVEL: Salicylate Lvl: <7 mg/dL (ref 2.8–30.0)

## 2019-06-02 LAB — SARS CORONAVIRUS 2 BY RT PCR (HOSPITAL ORDER, PERFORMED IN ~~LOC~~ HOSPITAL LAB): SARS Coronavirus 2: NEGATIVE

## 2019-06-02 LAB — ETHANOL: Alcohol, Ethyl (B): <10 mg/dL (ref <10)

## 2019-06-02 LAB — ACETAMINOPHEN LEVEL: Acetaminophen (Tylenol), Serum: <10 ug/mL — ABNORMAL LOW (ref 10–30)

## 2019-06-02 MED ORDER — DIVALPROEX SODIUM ER 250 MG PO TB24: 500.0000 mg | ORAL_TABLET | Freq: Every day | ORAL | Status: DC

## 2019-06-02 MED ORDER — OLANZAPINE 5 MG PO TABS: 5.0000 mg | ORAL_TABLET | Freq: Every day | ORAL | Status: DC

## 2019-06-02 MED ORDER — LAMOTRIGINE 100 MG PO TABS: 200.0000 mg | ORAL_TABLET | Freq: Every day | ORAL | Status: DC

## 2019-06-02 MED ORDER — LORAZEPAM 2 MG PO TABS
2.0000 mg | ORAL_TABLET | Freq: Once | ORAL | Status: AC
  Administered 2019-06-02: 01:00:00 2 mg via ORAL
  Filled 2019-06-02: qty 1

## 2019-06-02 MED ORDER — RISPERIDONE 1 MG PO TABS: 6.0000 mg | ORAL_TABLET | Freq: Every evening | ORAL | Status: DC

## 2019-06-02 MED ORDER — LAMOTRIGINE 100 MG PO TABS: 100.0000 mg | ORAL_TABLET | Freq: Every day | ORAL | Status: DC

## 2019-06-02 MED ORDER — OLANZAPINE 5 MG PO TBDP
10.0000 mg | ORAL_TABLET | ORAL | Status: AC
  Administered 2019-06-02: 03:00:00 10 mg via ORAL
  Filled 2019-06-02: qty 2

## 2019-06-02 MED ORDER — OLANZAPINE 10 MG PO TABS: 10.0000 mg | ORAL_TABLET | ORAL | Status: DC

## 2019-06-02 MED ORDER — VITAMIN D (ERGOCALCIFEROL) 1.25 MG (50000 UNIT) PO CAPS
50000.0000 [IU] | ORAL_CAPSULE | ORAL | Status: DC
  Filled 2019-06-02: qty 1

## 2019-06-02 MED ORDER — VITAMIN C 500 MG PO TABS
1000.0000 mg | ORAL_TABLET | Freq: Every day | ORAL | Status: DC
  Filled 2019-06-02: qty 2

## 2019-06-02 MED ORDER — PROPRANOLOL HCL ER 60 MG PO CP24
60.0000 mg | ORAL_CAPSULE | Freq: Every day | ORAL | Status: DC
  Filled 2019-06-02: qty 1

## 2019-06-02 MED ORDER — DIVALPROEX SODIUM ER 250 MG PO TB24: 1000.0000 mg | ORAL_TABLET | Freq: Every day | ORAL | Status: DC

## 2019-06-02 NOTE — ED Notes (Addendum)
Pt leaving for [facility]. Maintained on 15 minute checks and observation by security camera for safety.

## 2019-06-02 NOTE — ED Notes (Signed)
Pt discharged to [facility] under IVC. VS stable. All belongings given to officer. Legal guardian aware of transfer. Report given to Mickel Baas, RN.

## 2019-06-02 NOTE — ED Notes (Addendum)
EMTALA reviewed by this RN.  Signature not required d/t IVC status.

## 2019-06-02 NOTE — ED Notes (Signed)
Report received from Sherie RN. Pt is observed resting in room with eyes closed and regular/even/unlabored respirations. Will continue to monitor for needs and safety.  

## 2019-06-02 NOTE — Consult Note (Signed)
[facility] Face-to-Face Psychiatry Consult   Reason for Consult: Hallucinations Referring Physician: Dr. Karma Greaser Patient Identification: Daniel Craig MRN:  [id number] Principal Diagnosis: <principal problem not specified> Diagnosis:  Active Problems:   Extremely severe auditory hallucinations   Total Time spent with patient: 1 hour  Subjective: "I am Jesus Christ."  "I have to take care of all these people." Daniel Craig is a 28 y.o. male patient presented to [facility] ED via EMS.  During the patient assessment he is presenting with hallucinations. Stating he is [employer]. Crying out loudly and referencing a male stating she left him.  "I wish she will come back to me. I do not know where she is."  During the patient entire assessment he would cry out loudly, yell out stating "I am Jesus Christ. I am here to take care for these people."  The patient states "I smoke some marijuana today.  It makes me feel better."  The patient was given 2 mg of Ativan p.o. and he remains agitated, increased hallucinations and very emotional." The patient was seen face-to-face by this provider; chart reviewed and consulted with Dr. Karma Greaser on 06/02/2019 due to the care of the patient. It was discussed with the provider that the patient does meet criteria to be admitted to the inpatient unit.   The patient is presenting with increased auditory and visual hallucinations, paranoia stating he is Jesus Christ and he is here to take care of all of these people. On evaluation the patient is alert and oriented, irritable but cooperative the patient is speaking nonsensical when questions are presented to him.  The patient does appear to be responding to internal and external stimuli.  He states "I see all kinds of things around me."  "He yells out, Jesus, Jesus and begin praying. The patient is presenting with delusional thinking. The patient admits to auditory and visual hallucinations. The patient denies any suicidal,  homicidal, or self-harm ideations. The patient is  presenting with psychotic and paranoid behaviors.  "The voices are telling me I am Jesus Christ and I am here to take care of all these people." During an encounter with the patient, he was unable to answer questions appropriately. Collateral was obtained by mother who expresses concerns for patient's suicidal thoughts.   Collateral information was obtained from his Conservator, museum/gallery through [employer] [phone] Evans:[phone]).  Ms. Amalia Hailey stated she was with the patient on June 01, 2019.  She voiced "he was acting a little different but nothing out of the ordinary."  She stated that the patient did make reference about a male being his girlfriend and wanting her back in his life.  She stated she has been working with the patient for a while and has never met the male that he is romantically involved with.  She did discussed that this person must have been someone in his past from many years ago.  She did discuss the patient multiple hospital visits in the past few weeks.  She did admit to the patient being medication compliant and also that the patient is been followed by the ACT team by [name]. Provider made attempts to contact the patient father Mr. Daniel Craig; [phone]): Unable to leave a HIPPA message due to the voicemail not being personalized.  Plan: The patient is not a safety risk to self or others and does not require psychiatric inpatient admission for stabilization and treatment.  HPI:   Past Psychiatric History:  Schizophrenia ([place]) Seizures ([place])  Risk to Self:  Yes Risk to Others:  No Prior Inpatient Therapy:   Yes Prior Outpatient Therapy:  Yes  Past Medical History:  Past Medical History:  Diagnosis Date  . Schizophrenia ([place])   . Seizures ([place])     Past Surgical History:  Procedure Laterality Date  . MOUTH SURGERY     Family History:  Family History  Family history unknown: Yes    Family Psychiatric  History: None  Social History:  Social History   Substance and Sexual Activity  Alcohol Use Yes  . Alcohol/week: 4.0 standard drinks  . Types: 4 Shots of liquor per week   Comment: pt states 4 shots of liqour oer week     Social History   Substance and Sexual Activity  Drug Use Yes  . Types: Marijuana    Social History   Socioeconomic History  . Marital status: Single    Spouse name: Not on file  . Number of children: Not on file  . Years of education: Not on file  . Highest education level: Not on file  Occupational History  . Not on file  Social Needs  . Financial resource strain: Not on file  . Food insecurity    Worry: Not on file    Inability: Not on file  . Transportation needs    Medical: Not on file    Non-medical: Not on file  Tobacco Use  . Smoking status: Current Every Day Smoker    Packs/day: 1.00    Types: Cigarettes  . Smokeless tobacco: Never Used  Substance and Sexual Activity  . Alcohol use: Yes    Alcohol/week: 4.0 standard drinks    Types: 4 Shots of liquor per week    Comment: pt states 4 shots of liqour oer week  . Drug use: Yes    Types: Marijuana  . Sexual activity: Not on file  Lifestyle  . Physical activity    Days per week: Not on file    Minutes per session: Not on file  . Stress: Not on file  Relationships  . Social Herbalist on phone: Not on file    Gets together: Not on file    Attends religious service: Not on file    Active member of club or organization: Not on file    Attends meetings of clubs or organizations: Not on file    Relationship status: Not on file  Other Topics Concern  . Not on file  Social History Narrative  . Not on file   Additional Social History:    Allergies:   Allergies  Allergen Reactions  . Keppra [Levetiracetam]   . Trileptal [Oxcarbazepine]   . Vimpat [Lacosamide]     Labs:  Results for orders placed or performed during the hospital encounter of  06/02/19 (from the past 48 hour(s))  CBC with Diff     Status: Abnormal   Collection Time: 06/02/19  1:12 AM  Result Value Ref Range   WBC 7.8 4.0 - 10.5 K/uL   RBC 4.47 4.22 - 5.81 MIL/uL   Hemoglobin 14.0 13.0 - 17.0 g/dL   HCT 40.5 39.0 - 52.0 %   MCV 90.6 80.0 - 100.0 fL   MCH 31.3 26.0 - 34.0 pg   MCHC 34.6 30.0 - 36.0 g/dL   RDW 12.3 11.5 - 15.5 %   Platelets 208 150 - 400 K/uL   nRBC 0.0 0.0 - 0.2 %   Neutrophils Relative % 24 %  Neutro Abs 1.9 1.7 - 7.7 K/uL   Lymphocytes Relative 61 %   Lymphs Abs 4.8 (H) 0.7 - 4.0 K/uL   Monocytes Relative 13 %   Monocytes Absolute 1.0 0.1 - 1.0 K/uL   Eosinophils Relative 1 %   Eosinophils Absolute 0.1 0.0 - 0.5 K/uL   Basophils Relative 1 %   Basophils Absolute 0.0 0.0 - 0.1 K/uL   Immature Granulocytes 0 %   Abs Immature Granulocytes 0.02 0.00 - 0.07 K/uL    Comment: Performed at [facility], [address] [place], [place] [address]  Basic metabolic panel     Status: Abnormal   Collection Time: 06/02/19  1:12 AM  Result Value Ref Range   Sodium 139 135 - 145 mmol/L   Potassium 3.6 3.5 - 5.1 mmol/L   Chloride 107 98 - 111 mmol/L   CO2 23 22 - 32 mmol/L   Glucose, Bld 124 (H) 70 - 99 mg/dL   BUN 8 6 - 20 mg/dL   Creatinine, Ser 0.93 0.61 - 1.24 mg/dL   Calcium 9.1 8.9 - 10.3 mg/dL   GFR calc non Af Amer >60 >60 mL/min   GFR calc Af Amer >60 >60 mL/min   Anion gap 9 5 - 15    Comment: Performed at [facility], [address], [place], [place] [address]    Current Facility-Administered Medications  Medication Dose Route Frequency Provider Last Rate Last Dose  . OLANZapine zydis (ZYPREXA) disintegrating tablet 10 mg  10 mg Oral STAT Hinda Kehr, MD       Current Outpatient Medications  Medication Sig Dispense Refill  . Ascorbic Acid (VITAMIN C) 1000 MG tablet Take 1,000 mg by mouth daily.    . divalproex (DEPAKOTE ER) 500 MG 24 hr tablet Take 500-1,000 mg by mouth See admin instructions. 500 mg  every morning and 1000 mg at bedtime    . lamoTRIgine (LAMICTAL) 100 MG tablet Take 100-200 mg by mouth See admin instructions. 100 mg every morning and 200 mg at bedtime    . LORazepam (ATIVAN) 1 MG tablet Take 1 tablet (1 mg total) by mouth 2 (two) times daily as needed for seizure (or shaking). 10 tablet 0  . meloxicam (MOBIC) 15 MG tablet Take 1 tablet (15 mg total) by mouth daily as needed for pain.    Marland Kitchen OLANZapine (ZYPREXA) 5 MG tablet Take 5 mg by mouth at bedtime.     . propranolol ER (INDERAL LA) 60 MG 24 hr capsule Take 60 mg by mouth daily.    . risperiDONE (RISPERDAL) 3 MG tablet Take 6 mg by mouth Nightly.    . Vitamin D, Ergocalciferol, (DRISDOL) 50000 units CAPS capsule Take 50,000 Units by mouth every 7 (seven) days.      Musculoskeletal: Strength & Muscle Tone: within normal limits Gait & Station: normal Patient leans: N/A  Psychiatric Specialty Exam: Physical Exam  Nursing note and vitals reviewed. Constitutional: He is oriented to person, place, and time. He appears well-developed and well-nourished.  HENT:  Head: Normocephalic.  Eyes: Pupils are equal, round, and reactive to light. Conjunctivae are normal.  Neck: Normal range of motion. Neck supple.  Cardiovascular: Normal rate and regular rhythm.  Respiratory: Effort normal and breath sounds normal.  Musculoskeletal: Normal range of motion.  Neurological: He is alert and oriented to person, place, and time.  Skin: Skin is warm and dry.    Review of Systems  Psychiatric/Behavioral: Positive for hallucinations. The patient is nervous/anxious.  All other systems reviewed and are negative.   Blood pressure (!) 131/94, pulse 90, temperature 98.8 F (37.1 C), temperature source Oral, resp. rate 18, height 6' (1.829 m), weight 94.8 kg, SpO2 100 %.Body mass index is 28.35 kg/m.  General Appearance: Bizarre  Eye Contact:  None  Speech:  Pressured  Volume:  Increased  Mood:  Anxious, Euphoric and Hopeless  Affect:   Tearful  Thought Process:  Disorganized  Orientation:  Full (Time, Place, and Person)  Thought Content:  Hallucinations: Command:  I am God. Jesus is telling me to do all of this. Visual  Suicidal Thoughts:  No  Homicidal Thoughts:  No  Memory:  Immediate;   Poor Recent;   Poor  Judgement:  Impaired  Insight:  Lacking  Psychomotor Activity:  Normal  Concentration:  Concentration: Poor and Attention Span: Poor  Recall:  Poor  Fund of Knowledge:  Poor  Language:  Fair  Akathisia:  Negative  Handed:  Right  AIMS (if indicated):     Assets:  Desire for Improvement Social Support Vocational/Educational  ADL's:  Intact  Cognition:  Impaired,  Moderate  Sleep:   Insomnia     Treatment Plan Summary: Daily contact with patient to assess and evaluate symptoms and progress in treatment, Medication management and Plan The patient does meet criteria for psychiatric inpatient admission once medically clear  Disposition: Recommend psychiatric Inpatient admission when medically cleared. Supportive therapy provided about ongoing stressors.  Lamont Dowdy, NP 06/02/2019 2:01 AM

## 2019-06-02 NOTE — ED Notes (Signed)
[facility]  DEPT  CALLED  FOR TRANSPORT 

## 2019-06-02 NOTE — BH Assessment (Signed)
Patient has been accepted to [facility].  Patient assigned to [facility] Accepting physician is Dr. Alcide Clever.  Call report to [phone].  Representative was [employer].   ER Staff is aware of it:  Lisa/Annette, ER Secretary  Dr. Quentin Cornwall, ER MD  Amy B., Patient's Nurse     Patient's [place] ([place]: [phone]) have been updated as well.

## 2019-06-02 NOTE — ED Provider Notes (Signed)
[facility] Emergency Department Provider Note  ____________________________________________   First MD Initiated Contact with Patient 06/02/19 [phone]     (approximate)  I have reviewed the triage vital signs and the nursing notes.   HISTORY  Chief Complaint Depression  Level 5 caveat:  history/ROS limited by active psychosis / mental illness / altered mental status   HPI Tarrell Avantae Bither is a 28 y.o. male with history of seizure disorder as well as schizophrenia who presents for evaluation of hallucinations, hyperreligiosity, labile emotions.  Apparently he smokes marijuana earlier today.  He is now stating that he believes he is [employer] and he is seeing things that only God should see.  He is intermittently conversant and then will become very sad and starts sobbing.  He denies any pain.  He has not had any recent fever, chest pain, shortness of breath, nausea, vomiting, abdominal pain.  He has not been around anyone known to have COVID-19.  His symptoms are severe and apparently relatively acute in onset.  Allegedly he has been taking his medications which includes Depakote for his seizure disorder.         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 Authorizing Provider    Ascorbic Acid (VITAMIN C) 1000 MG tablet Take 1,000 mg by mouth daily.    [provider]  divalproex (DEPAKOTE ER) 500 MG 24 hr tablet Take 500-1,000 mg by mouth See admin instructions. 500 mg every morning and 1000 mg at bedtime    [provider]  lamoTRIgine (LAMICTAL) 100 MG tablet Take 100-200 mg by mouth See admin instructions. 100 mg every morning and 200 mg at bedtime    [provider]  LORazepam (ATIVAN) 1 MG tablet Take 1 tablet (1 mg total) by mouth 2 (two) times daily as needed for seizure (or shaking). 01/06/17   Daymon Larsen, MD  meloxicam (MOBIC) 15 MG tablet Take 1 tablet (15 mg total) by mouth daily as needed for pain. 11/11/18   Gladstone Lighter, MD  OLANZapine (ZYPREXA) 5 MG tablet Take 5 mg by mouth at bedtime.     [provider]  propranolol ER (INDERAL LA) 60 MG 24 hr capsule Take 60 mg by mouth daily.    [provider]  risperiDONE (RISPERDAL) 3 MG tablet Take 6 mg by mouth Nightly. 10/01/16   [provider]  Vitamin D, Ergocalciferol, (DRISDOL) 50000 units CAPS capsule Take 50,000 Units by mouth every 7 (seven) days.    [provider]    Allergies Keppra [levetiracetam], Trileptal [oxcarbazepine], and Vimpat [lacosamide]  Family History  Family history unknown: Yes    Social History Social History   Tobacco Use   Smoking status: Current Every Day Smoker    Packs/day: 1.00    Types: Cigarettes  Smokeless tobacco: Never Used  Substance Use Topics   Alcohol use: Yes    Alcohol/week: 4.0 standard drinks    Types: 4 Shots of liquor per week    Comment: pt states 4 shots of liqour oer week   Drug use: Yes    Types: Marijuana    Review of Systems Level 5 caveat:  history/ROS limited by active psychosis / mental illness / altered mental status  ____________________________________________   PHYSICAL EXAM:  VITAL SIGNS: ED Triage Vitals  Enc Vitals Group     BP 06/02/19 0102  (!) 131/94     Pulse Rate 06/02/19 0102 90     Resp 06/02/19 0102 18     Temp 06/02/19 0102 98.8 F (37.1 C)     Temp Source 06/02/19 0102 Oral     SpO2 06/02/19 0102 100 %     Weight 06/02/19 0057 94.8 kg (209 lb)     Height 06/02/19 0057 1.829 m (6')     Head Circumference --      Peak Flow --      Pain Score 06/02/19 0057 0     Pain Loc --      Pain Edu? --      Excl. in [place]? --     Constitutional: Alert and oriented, no physical distress but does appear to be and some psychiatric distress with frequent sobbing episodes. Eyes: Conjunctivae are normal.  Head: Atraumatic. Nose: No congestion/rhinnorhea. Mouth/Throat: Mucous membranes are moist. Neck: No stridor.  No meningeal signs.   Cardiovascular: Normal rate, regular rhythm. Good peripheral circulation. Grossly normal heart sounds. Respiratory: Normal respiratory effort.  No retractions. No audible wheezing. Musculoskeletal: No lower extremity tenderness nor edema. No gross deformities of extremities. Neurologic:  Normal speech and language. No gross focal neurologic deficits are appreciated.  Skin:  Skin is warm, dry and intact. No rash noted. Psychiatric: Mood and affect are abnormal with pressured speech, hyperreligiosity, reported visual hallucinations, and feeling like he is [employer].  He has labile emotions and is frequently sobbing.  ____________________________________________   LABS (all labs ordered are listed, but only abnormal results are displayed)  Labs Reviewed  URINE DRUG SCREEN, QUALITATIVE ([facility] ONLY) - Abnormal; Notable for the following components:      Result Value   Cannabinoid 50 Ng, Ur [place] POSITIVE (*)    All other components within normal limits  CBC WITH DIFFERENTIAL/PLATELET - Abnormal; Notable for the following components:   Lymphs Abs 4.8 (*)    All other components within normal limits  BASIC METABOLIC PANEL - Abnormal; Notable for the following components:   Glucose, Bld 124 (*)     All other components within normal limits  ACETAMINOPHEN LEVEL - Abnormal; Notable for the following components:   Acetaminophen (Tylenol), Serum <10 (*)    All other components within normal limits  ETHANOL  SALICYLATE LEVEL  VALPROIC ACID LEVEL   ____________________________________________  EKG  None - EKG not ordered by ED physician ____________________________________________  RADIOLOGY   ED MD interpretation: No indication for imaging  Official radiology report(s): No results found.  ____________________________________________   PROCEDURES   Procedure(s) performed (including Critical Care):  Procedures   ____________________________________________   INITIAL IMPRESSION / MDM / ASSESSMENT AND PLAN / ED COURSE  As part of my medical decision making, I reviewed the following data within the electronic MEDICAL RECORD NUMBER Nursing notes reviewed and incorporated, Labs reviewed , Old chart reviewed, A consult was requested and obtained from this/these consultant(s)  Psychiatry, Notes from prior ED visits and [place] Controlled Substance Database        Differential diagnosis includes, but is not limited to, schizophrenia, substance-induced mood disorder, adjustment disorder.  The patient is in no acute psychiatric distress.  He is not capable of making his own decisions at this time and he also has a legal guardian who is apparently concerned about him.  Given his current mental state I provided Ativan 2 mg by mouth and then Zyprexa ODT 10 mg by mouth.  This allowed him to calm down and rest comfortably after being evaluated by the psychiatric nurse practitioner, Annice PihJackie, who believes he would benefit from inpatient treatment.  He is currently being referred out to other facilities for placement.  His lab results are all reassuring and his Depakote level is within normal limits.  I have ordered his home medications.  I have ordered a rapid coronavirus swab to assist in  placement.      ____________________________________________  FINAL CLINICAL IMPRESSION(S) / ED DIAGNOSES  Final diagnoses:  Schizophrenia, unspecified type (HCC)  Cannabis abuse     MEDICATIONS GIVEN DURING THIS VISIT:  Medications  LORazepam (ATIVAN) tablet 2 mg (2 mg Oral Given 06/02/19 0114)  OLANZapine zydis (ZYPREXA) disintegrating tablet 10 mg (10 mg Oral Given 06/02/19 0232)     ED Discharge Orders    None      *Please note:  Merrilee JanskyKeon Evin Steinert was evaluated in Emergency Department on 06/02/2019 for the symptoms described in the history of present illness. He was evaluated in the context of the global COVID-19 pandemic, which necessitated consideration that the patient might be at risk for infection with the SARS-CoV-2 virus that causes COVID-19. Institutional protocols and algorithms that pertain to the evaluation of patients at risk for COVID-19 are in a state of rapid change based on information released by regulatory bodies including the CDC and federal and state organizations. These policies and algorithms were followed during the patient's care in the ED.  Some ED evaluations and interventions may be delayed as a result of limited staffing during the pandemic.*  Note:  This document was prepared using Dragon voice recognition software and may include unintentional dictation errors.   Loleta RoseForbach, Kayleena Eke, MD 06/02/19 [phone]

## 2019-06-02 NOTE — ED Notes (Signed)
Referral information for Psychiatric Hospitalization faxed to;   [facility] [phone]),   . [facility] [phone], [phone], [phone] or [phone]),   . [place] [phone] or [phone])  . [facility] [phone]),   . [facility] [employer] [facility] [phone]),   . [facility] [phone])

## 2019-06-02 NOTE — ED Triage Notes (Signed)
Pt to the er via ems for feeling weird. Pt states he feels like he is [employer]. Pt reports hx of hallucinations. Pt states he takes meds but they don't work. He states he walks and prays.

## 2019-06-02 NOTE — ED Notes (Signed)
Breakfast tray provided at this time, pt continues to sleep comfortably.

## 2019-06-11 DIAGNOSIS — Z79899 Other long term (current) drug therapy: Secondary | ICD-10-CM | POA: Diagnosis not present

## 2019-06-11 DIAGNOSIS — F17210 Nicotine dependence, cigarettes, uncomplicated: Secondary | ICD-10-CM | POA: Insufficient documentation

## 2019-06-11 DIAGNOSIS — R0981 Nasal congestion: Secondary | ICD-10-CM | POA: Diagnosis not present

## 2019-06-11 LAB — CBC
HCT: 36.2 % — ABNORMAL LOW (ref 39.0–52.0)
Hemoglobin: 12.3 g/dL — ABNORMAL LOW (ref 13.0–17.0)
MCH: 31.4 pg (ref 26.0–34.0)
MCHC: 34 g/dL (ref 30.0–36.0)
MCV: 92.3 fL (ref 80.0–100.0)
Platelets: 203 10*3/uL (ref 150–400)
RBC: 3.92 MIL/uL — ABNORMAL LOW (ref 4.22–5.81)
RDW: 12.6 % (ref 11.5–15.5)
WBC: 8.1 10*3/uL (ref 4.0–10.5)
nRBC: 0 % (ref 0.0–0.2)

## 2019-06-11 NOTE — ED Triage Notes (Signed)
Patient coming [facility] from home for nasal congestion. Denies other symptoms.

## 2019-06-11 NOTE — ED Notes (Addendum)
Patient now also c/o abdominal pain. Patient denies N/V and urinary symptoms. Patient c/o diarrhea.

## 2019-06-12 ENCOUNTER — Emergency Department
Admission: EM | Admit: 2019-06-12 | Discharge: 2019-06-12 | Disposition: A | Discharge status: Home / Self Care | Payer: Medicaid Other | Attending: Emergency Medicine | Admitting: Emergency Medicine

## 2019-06-12 DIAGNOSIS — R0981 Nasal congestion: Secondary | ICD-10-CM

## 2019-06-12 LAB — COMPREHENSIVE METABOLIC PANEL
ALT: 10 U/L (ref 0–44)
AST: 13 U/L — ABNORMAL LOW (ref 15–41)
Albumin: 3.4 g/dL — ABNORMAL LOW (ref 3.5–5.0)
Alkaline Phosphatase: 30 U/L — ABNORMAL LOW (ref 38–126)
Anion gap: 7 (ref 5–15)
BUN: 8 mg/dL (ref 6–20)
CO2: 26 mmol/L (ref 22–32)
Calcium: 8.4 mg/dL — ABNORMAL LOW (ref 8.9–10.3)
Chloride: 104 mmol/L (ref 98–111)
Creatinine, Ser: 0.78 mg/dL (ref 0.61–1.24)
GFR calc Af Amer: >60 mL/min (ref >60)
GFR calc non Af Amer: >60 mL/min (ref >60)
Glucose, Bld: 127 mg/dL — ABNORMAL HIGH (ref 70–99)
Potassium: 3.9 mmol/L (ref 3.5–5.1)
Sodium: 137 mmol/L (ref 135–145)
Total Bilirubin: 0.6 mg/dL (ref 0.3–1.2)
Total Protein: 6.1 g/dL — ABNORMAL LOW (ref 6.5–8.1)

## 2019-06-12 LAB — LIPASE, BLOOD: Lipase: 29 U/L (ref 11–51)

## 2019-06-12 MED ORDER — LORATADINE 10 MG PO TABS
10.0000 mg | ORAL_TABLET | Freq: Once | ORAL | Status: AC
  Administered 2019-06-12: 10 mg via ORAL
  Filled 2019-06-12: qty 1

## 2019-06-12 NOTE — ED Notes (Signed)
Pts legal guardian called again and answered at this time. He is 2hrs away states he is unable to come get pt. States pt normally goes home in cab.

## 2019-06-12 NOTE — ED Provider Notes (Addendum)
[facility] Emergency Department Provider Note  ____________________________________________   First MD Initiated Contact with Patient 06/12/19 0211     (approximate)  I have reviewed the triage vital signs and the nursing notes.   HISTORY  Chief Complaint Nasal Congestion and Abdominal Pain  Level 5 caveat:  history/ROS may be limited by chronic psychiatric illness and/or cognitive disability    HPI Daniel Craig is a 28 y.o. male with medical history as listed below who has a legal guardian secondary to his disability from his psychiatric illness.  He presents by EMS for evaluation of nasal congestion.  He reports that when he is going to sleep his nose felt clogged up and he was having trouble breathing and it scared him so he called 911.  He feels better now.  He is having no trouble breathing.  He denies fever, sore throat, chest pain, cough, nausea, vomiting, and abdominal pain.  He was tested about 10 days ago for coronavirus and was negative; he was seen by me in the emergency department for psychiatric complaint.  He reports he feels fine now and would just like to sleep.  Symptoms were possibly acute in onset and nothing particular made them better or worse but they are better now.  He is not sure if he has a history of allergies.         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 Authorizing Provider  Ascorbic Acid (VITAMIN C) 1000 MG tablet Take 1,000 mg by mouth daily.    [provider]  divalproex (DEPAKOTE ER) 500 MG 24 hr tablet Take 500-1,000 mg by mouth See admin instructions. 500 mg every morning and 1000 mg at bedtime    [provider]  lamoTRIgine (LAMICTAL) 100 MG tablet Take 100-200 mg by mouth See admin instructions. 100 mg every morning and 200 mg at bedtime    [provider]  LORazepam (ATIVAN) 1 MG tablet Take 1 tablet (1 mg total) by mouth 2 (two) times daily as needed for seizure (or shaking). 01/06/17   Jennye MoccasinQuigley, Brian S, MD  meloxicam (MOBIC) 15 MG tablet Take 1 tablet (15 mg total) by mouth daily as needed for pain. 11/11/18   Enid BaasKalisetti, Radhika, MD  OLANZapine (ZYPREXA) 5 MG tablet Take 5 mg by mouth at bedtime.     [provider]  propranolol ER (INDERAL LA) 60 MG 24 hr capsule Take 60 mg by mouth daily.    [provider]  risperiDONE (RISPERDAL) 3 MG tablet Take 6 mg by mouth Nightly. 10/01/16   [provider]  Vitamin D, Ergocalciferol, (DRISDOL) 50000 units CAPS capsule Take 50,000 Units by mouth every 7 (seven) days.    [provider]    Allergies Keppra [levetiracetam], Trileptal [oxcarbazepine], and Vimpat [lacosamide]  Family History  Family history unknown: Yes    Social History Social History   Tobacco Use   Smoking status: Current Every Day Smoker    Packs/day: 1.00    Types: Cigarettes   Smokeless tobacco: Never Used  Substance Use Topics   Alcohol use: Yes    Alcohol/week: 4.0 standard drinks    Types: 4 Shots of liquor per week    Comment: pt states 4 shots of liqour oer week   Drug use: Yes    Types: Marijuana    Review of Systems Level 5 caveat:  history/ROS may be limited by chronic psychiatric illness and/or cognitive  disability  Constitutional: No fever/chills Eyes: No visual changes. ENT: Nasal congestion, now improved.  No sore throat. Cardiovascular: Denies chest pain. Respiratory: Initially he was concerned about shortness of breath associated with his his congestion, now improved. Gastrointestinal: No abdominal pain.  No nausea, no vomiting.  No diarrhea.  No constipation. Genitourinary: Negative for dysuria. Musculoskeletal: Negative for neck pain.  Negative for back pain. Integumentary: Negative for rash. Neurological: Negative for headaches, focal weakness or numbness.   ____________________________________________   PHYSICAL EXAM:  ED Triage Vitals  Enc Vitals Group     BP 06/11/19 2319 (!) 108/53     Pulse Rate 06/11/19 2319 69     Resp 06/11/19 2319 17     Temp 06/11/19 2319 98.4 F (36.9 C)     Temp src --      SpO2 06/11/19 2319 99 %     Weight --      Height --      Head Circumference --      Peak Flow --      Pain Score 06/11/19 2317 6     Pain Loc --      Pain Edu? --      Excl. in [place]? --       Constitutional: Alert and oriented. Well appearing and in no acute distress. Eyes: Conjunctivae are normal.  Head: Atraumatic. Nose: Mild congestion, no rhinorrhea Mouth/Throat: Mucous membranes are moist. Neck: No stridor.  No meningeal signs.   Cardiovascular: Normal rate, regular rhythm. Good peripheral circulation. Grossly normal heart sounds. Respiratory: Normal respiratory effort.  No retractions. No audible wheezing. Gastrointestinal: Soft and nontender. No distention.  Musculoskeletal: No lower extremity tenderness nor edema. No gross deformities of extremities. Neurologic:  Normal speech and language. No gross focal neurologic deficits are appreciated.  Skin:  Skin is warm, dry and intact. No rash noted.   ____________________________________________   LABS (all labs ordered are listed, but only abnormal results are displayed)  Labs Reviewed   COMPREHENSIVE METABOLIC PANEL - Abnormal; Notable for the following components:      Result Value   Glucose, Bld 127 (*)    Calcium 8.4 (*)    Total Protein 6.1 (*)    Albumin 3.4 (*)    AST 13 (*)    Alkaline Phosphatase 30 (*)    All other components within normal limits  CBC - Abnormal; Notable for the following components:   RBC 3.92 (*)    Hemoglobin 12.3 (*)    HCT 36.2 (*)    All other components within normal limits  LIPASE, BLOOD   ____________________________________________  EKG  None - EKG not ordered by ED physician ____________________________________________  RADIOLOGY   ED MD interpretation: No indication for imaging  Official radiology report(s): No results found.  ____________________________________________   PROCEDURES   Procedure(s) performed (including Critical Care):  Procedures   ____________________________________________   INITIAL IMPRESSION / MDM / ASSESSMENT AND PLAN / ED COURSE  As part of my medical decision making, I reviewed the following data within the electronic MEDICAL RECORD NUMBER Nursing notes reviewed and incorporated, Labs reviewed  and Notes from prior ED visits   Differential diagnosis includes, but is not limited to, nasal congestion, allergic reaction, anxiety or panic attack, COVID-19 or other nonspecific viral infection, pneumonia.  The patient is well-appearing in no distress with normal vital signs and afebrile.  He was recently treated for COVID-19 and found to be negative.  He got scared when he was trying to sleep and his nose congested and he felt like he could not breathe, but now he is lying down flat and breathing without any difficulty and resting comfortably.  He says he is ready to go home and go to bed.  I gave him a Claritin 10 mg by mouth and encouraged outpatient follow-up.  I gave my usual customary return precautions.  Lab work drawn in triage she was reassuring with no evidence of acute infection or  other acute abnormality.  The patient's legal guardian will be contacted by the patient's ED nurse prior to discharge.          ____________________________________________  FINAL CLINICAL IMPRESSION(S) / ED DIAGNOSES  Final diagnoses:  Nasal congestion     MEDICATIONS GIVEN DURING THIS VISIT:  Medications  loratadine (CLARITIN) tablet 10 mg (10 mg Oral Given 06/12/19 0240)     ED Discharge Orders    None      *Please note:  Daniel Craig was evaluated in Emergency Department on 06/12/2019 for the symptoms described in the history of present illness. He was evaluated in the context of the global COVID-19 pandemic, which necessitated consideration that the patient might be at risk for infection with the SARS-CoV-2 virus that causes COVID-19. Institutional protocols and algorithms that pertain to the evaluation of patients at risk for COVID-19 are in a state of rapid change based on information released by regulatory bodies including the CDC and federal and state organizations. These policies and algorithms were followed during the patient's care in the ED.  Some ED evaluations and interventions may be delayed as a result of limited staffing during the pandemic.*  Note:  This document was prepared using Dragon voice recognition software and may include unintentional dictation errors.   Loleta RoseForbach, Maguadalupe Lata, MD 06/12/19 [id number]    Loleta RoseForbach, Dmitry Macomber, MD 06/12/19 [phone]

## 2019-06-12 NOTE — Discharge Instructions (Signed)
Your workup in the Emergency Department today was reassuring.  We did not find any specific abnormalities.  We recommend you drink plenty of fluids, take your regular medications and/or any new ones prescribed today, and follow up with the doctor(s) listed in these documents as recommended.  If your nose continues to bother you, try taking an over-the-counter allergy medication such as Claritin or Zyrtec.  Return to the Emergency Department if you develop new or worsening symptoms that concern you.

## 2019-06-12 NOTE — ED Notes (Addendum)
pts father called back and said he was coming to get pt, pt waiting in lobb, first nurse notified pt waiting in lobby.

## 2019-06-12 NOTE — ED Notes (Signed)
Patient's legal guardian called, no answer

## 2019-07-03 ENCOUNTER — Emergency Department
Admission: EM | Admit: 2019-07-03 | Discharge: 2019-07-04 | Disposition: A | Discharge status: Home / Self Care | Payer: Medicaid Other | Attending: Emergency Medicine | Admitting: Emergency Medicine

## 2019-07-03 ENCOUNTER — Encounter: Payer: Self-pay | Admitting: Emergency Medicine

## 2019-07-03 ENCOUNTER — Other Ambulatory Visit: Payer: Self-pay

## 2019-07-03 DIAGNOSIS — Z046 Encounter for general psychiatric examination, requested by authority: Secondary | ICD-10-CM | POA: Insufficient documentation

## 2019-07-03 DIAGNOSIS — F209 Schizophrenia, unspecified: Secondary | ICD-10-CM | POA: Insufficient documentation

## 2019-07-03 DIAGNOSIS — F17210 Nicotine dependence, cigarettes, uncomplicated: Secondary | ICD-10-CM | POA: Diagnosis not present

## 2019-07-03 DIAGNOSIS — R569 Unspecified convulsions: Secondary | ICD-10-CM

## 2019-07-03 DIAGNOSIS — Z20828 Contact with and (suspected) exposure to other viral communicable diseases: Secondary | ICD-10-CM | POA: Diagnosis not present

## 2019-07-03 DIAGNOSIS — G40909 Epilepsy, unspecified, not intractable, without status epilepticus: Secondary | ICD-10-CM | POA: Diagnosis not present

## 2019-07-03 DIAGNOSIS — Z79899 Other long term (current) drug therapy: Secondary | ICD-10-CM | POA: Diagnosis not present

## 2019-07-03 DIAGNOSIS — F12980 Cannabis use, unspecified with anxiety disorder: Secondary | ICD-10-CM | POA: Insufficient documentation

## 2019-07-03 LAB — URINALYSIS, COMPLETE (UACMP) WITH MICROSCOPIC
Bacteria, UA: NONE SEEN
Bilirubin Urine: NEGATIVE
Glucose, UA: NEGATIVE mg/dL
Hgb urine dipstick: NEGATIVE
Ketones, ur: 5 mg/dL — AB
Leukocytes,Ua: NEGATIVE
Nitrite: NEGATIVE
Protein, ur: 100 mg/dL — AB
Specific Gravity, Urine: 1.014 (ref 1.005–1.030)
pH: 6 (ref 5.0–8.0)

## 2019-07-03 LAB — CBC
HCT: 42 % (ref 39.0–52.0)
Hemoglobin: 14.1 g/dL (ref 13.0–17.0)
MCH: 31.1 pg (ref 26.0–34.0)
MCHC: 33.6 g/dL (ref 30.0–36.0)
MCV: 92.7 fL (ref 80.0–100.0)
Platelets: 202 10*3/uL (ref 150–400)
RBC: 4.53 MIL/uL (ref 4.22–5.81)
RDW: 12.3 % (ref 11.5–15.5)
WBC: 8.5 10*3/uL (ref 4.0–10.5)
nRBC: 0 % (ref 0.0–0.2)

## 2019-07-03 LAB — BASIC METABOLIC PANEL
Anion gap: 14 (ref 5–15)
BUN: 6 mg/dL (ref 6–20)
CO2: 18 mmol/L — ABNORMAL LOW (ref 22–32)
Calcium: 9 mg/dL (ref 8.9–10.3)
Chloride: 106 mmol/L (ref 98–111)
Creatinine, Ser: 0.98 mg/dL (ref 0.61–1.24)
GFR calc Af Amer: >60 mL/min (ref >60)
GFR calc non Af Amer: >60 mL/min (ref >60)
Glucose, Bld: 113 mg/dL — ABNORMAL HIGH (ref 70–99)
Potassium: 3.6 mmol/L (ref 3.5–5.1)
Sodium: 138 mmol/L (ref 135–145)

## 2019-07-03 LAB — GLUCOSE, CAPILLARY: Glucose-Capillary: 106 mg/dL — ABNORMAL HIGH (ref 70–99)

## 2019-07-03 LAB — URINE DRUG SCREEN, QUALITATIVE (ARMC ONLY)
Amphetamines, Ur Screen: NOT DETECTED
Barbiturates, Ur Screen: NOT DETECTED
Benzodiazepine, Ur Scrn: NOT DETECTED
Cannabinoid 50 Ng, Ur ~~LOC~~: POSITIVE — AB
Cocaine Metabolite,Ur ~~LOC~~: NOT DETECTED
MDMA (Ecstasy)Ur Screen: NOT DETECTED
Methadone Scn, Ur: NOT DETECTED
Opiate, Ur Screen: NOT DETECTED
Phencyclidine (PCP) Ur S: NOT DETECTED
Tricyclic, Ur Screen: NOT DETECTED

## 2019-07-03 LAB — VALPROIC ACID LEVEL: Valproic Acid Lvl: 81 ug/mL (ref 50.0–100.0)

## 2019-07-03 MED ORDER — LORAZEPAM 2 MG/ML IJ SOLN
INTRAMUSCULAR | Status: AC
  Administered 2019-07-03: 17:00:00 1 mg via INTRAVENOUS
  Filled 2019-07-03: qty 1

## 2019-07-03 MED ORDER — SODIUM CHLORIDE 0.9 % IV BOLUS
1000.0000 mL | Freq: Once | INTRAVENOUS | Status: AC
  Administered 2019-07-03: 15:00:00 1000 mL via INTRAVENOUS

## 2019-07-03 MED ORDER — LORAZEPAM 2 MG/ML IJ SOLN
1.0000 mg | Freq: Once | INTRAMUSCULAR | Status: AC
  Administered 2019-07-03: 17:00:00 1 mg via INTRAVENOUS

## 2019-07-03 MED ORDER — TRAZODONE HCL 50 MG PO TABS
150.0000 mg | ORAL_TABLET | Freq: Every day | ORAL | Status: DC
  Administered 2019-07-03: 150 mg via ORAL
  Filled 2019-07-03: qty 1

## 2019-07-03 MED ORDER — HALOPERIDOL LACTATE 5 MG/ML IJ SOLN
5.0000 mg | Freq: Once | INTRAMUSCULAR | Status: AC
  Administered 2019-07-03: 17:00:00 5 mg via INTRAVENOUS

## 2019-07-03 MED ORDER — HALOPERIDOL LACTATE 5 MG/ML IJ SOLN
INTRAMUSCULAR | Status: AC
  Administered 2019-07-03: 17:00:00 5 mg via INTRAVENOUS
  Filled 2019-07-03: qty 1

## 2019-07-03 NOTE — ED Notes (Signed)
Daniel Craig  [phone] his [place] care person came to visit him  Med list updated

## 2019-07-03 NOTE — ED Notes (Signed)
Patient observed lying in bed with eyes closed  Even, unlabored respirations observed   NAD pt appears to be sleeping  I will continue to monitor along with every 15 minute visual observations and ongoing security monitoring    

## 2019-07-03 NOTE — ED Notes (Signed)
TTS machine placed in patients room.  Pt. Talking to TTS.  Pt. Requested and was given cup of soda.

## 2019-07-03 NOTE — ED Notes (Signed)
Pt. Requested sleep aid, md informed.

## 2019-07-03 NOTE — ED Triage Notes (Signed)
Pt was at his friends house when he had a seizure that lasted around 10 minutes. Pt states that he takes seizure medication, and doesn't recall when his last seizure was prior to today.

## 2019-07-03 NOTE — ED Notes (Signed)
Patient was given medications and is currently sleeping and unable to participate in the assessment at this time.

## 2019-07-03 NOTE — ED Notes (Signed)
Pt. Currently sleeping in bed in room 20A.  Pt. Has one on one safety sitter.

## 2019-07-03 NOTE — ED Notes (Signed)
Pt changed into behavioral clothing by this Probation officer and EDT Nicki Reaper, the following belongings obtained are as follows....  1 pair of black shoes, 1 pair of red shorts, 1 grey tank top, 1 pair of navy blue boxer shorts, 1 dog tag necklace with 2 yellow colored keys attached as well as what looked to be a life alert phob 1 pack of [place] cigarettes   Belongings were bagged up in pt belonging bag and placed at desk [employer]

## 2019-07-03 NOTE — ED Provider Notes (Signed)
[facility] Emergency Department Provider Note  Time seen: 2:19 PM  I have reviewed the triage vital signs and the nursing notes.   HISTORY  Chief Complaint Seizures   HPI Daniel Craig is a 28 y.o. male with a past medical history of seizure disorder, schizophrenia, presents to the emergency department after a possible seizure.  According to EMS patient was at a friend's house when he had a witnessed seizure lasting approximately 10 minutes.  Patient takes Depakote for seizures, denies missing any doses.  Cannot tell me when his last seizure was.  Denies any recent alcohol use.  Denies any fever cough congestion or shortness of breath.  No recent abdominal pain chest pain vomiting diarrhea.  Overall the patient appears well, states he feels fatigued but is able to answer questions appropriately and follow commands.   Past Medical History:  Diagnosis Date  . Schizophrenia (HCC)   . Seizures [facility](HCC)     Patient Active Problem List   Diagnosis Date Noted  . Extremely severe auditory hallucinations 06/02/2019  . Hypoglycemia 11/09/2018  . Schizophrenia, paranoid (HCC) 02/27/2018  . Cannabis use disorder, moderate, dependence (HCC) 02/27/2018  . Tobacco use disorder 02/27/2018  . 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 Altered mental status 12/28/2017  . Fever 08/11/2017  . Acute URI 08/11/2017  . Delirium 06/25/2017  . Schizophrenia (HCC) 06/25/2017  . Seizures (HCC) 06/25/2017    Past Surgical History:  Procedure Laterality Date  . MOUTH SURGERY      Prior to Admission medications   Medication Sig Start Date End Date Taking? Authorizing Provider  Ascorbic Acid (VITAMIN C) 1000 MG tablet Take 1,000 mg by mouth daily.    [provider]  divalproex (DEPAKOTE ER) 500 MG 24 hr tablet Take 500-1,000 mg by mouth See admin instructions. 500 mg every morning and 1000 mg at bedtime    [provider]  lamoTRIgine (LAMICTAL) 100 MG tablet Take  100-200 mg by mouth See admin instructions. 100 mg every morning and 200 mg at bedtime    [provider]  LORazepam (ATIVAN) 1 MG tablet Take 1 tablet (1 mg total) by mouth 2 (two) times daily as needed for seizure (or shaking). 01/06/17   Jennye MoccasinQuigley, Brian S, MD  meloxicam (MOBIC) 15 MG tablet Take 1 tablet (15 mg total) by mouth daily as needed for pain. 11/11/18   Enid BaasKalisetti, Radhika, MD  OLANZapine (ZYPREXA) 5 MG tablet Take 5 mg by mouth at bedtime.     [provider]  propranolol ER (INDERAL LA) 60 MG 24 hr capsule Take 60 mg by mouth daily.    [provider]  risperiDONE (RISPERDAL) 3 MG tablet Take 6 mg by mouth Nightly. 10/01/16   [provider]  Vitamin D, Ergocalciferol, (DRISDOL) 50000 units CAPS capsule Take 50,000 Units by mouth every 7 (seven) days.    [provider]    Allergies  Allergen Reactions  . Keppra [Levetiracetam]   . Trileptal [Oxcarbazepine]   . Vimpat [Lacosamide]     Family History  Family history unknown: Yes    Social History Social History   Tobacco Use  . Smoking status: Current Every Day Smoker    Packs/day: 1.00    Types: Cigarettes  . Smokeless tobacco: Never Used  Substance Use Topics  . Alcohol use: Yes    Alcohol/week: 4.0 standard drinks    Types: 4 Shots of liquor per week    Comment: pt states 4 shots of liqour  oer week  . Drug use: Yes    Types: Marijuana    Review of Systems Constitutional: Negative for fever. Cardiovascular: Negative for chest pain. Respiratory: Negative for shortness of breath. Gastrointestinal: Negative for abdominal pain, vomiting and diarrhea. Musculoskeletal: Negative for musculoskeletal complaints Skin: Negative for skin complaints  Neurological: Negative for headache All other ROS negative  ____________________________________________   PHYSICAL EXAM:  VITAL SIGNS: ED Triage Vitals  Enc Vitals Group     BP 07/03/19 1407 116/70     Pulse Rate  07/03/19 1407 71     Resp 07/03/19 1407 16     Temp 07/03/19 1407 97.7 F (36.5 C)     Temp Source 07/03/19 1407 Oral     SpO2 07/03/19 1407 97 %     Weight 07/03/19 1409 218 lb (98.9 kg)     Height 07/03/19 1409 6' (1.829 m)     Head Circumference --      Peak Flow --      Pain Score 07/03/19 1408 0     Pain Loc --      Pain Edu? --      Excl. in GC? --    Constitutional: Alert and oriented. Well appearing and in no distress. Eyes: Normal exam ENT      Head: Normocephalic and atraumatic.      Mouth/Throat: Mucous membranes are moist. Cardiovascular: Normal rate, regular rhythm.  Respiratory: Normal respiratory effort without tachypnea nor retractions. Breath sounds are clear  Gastrointestinal: Soft and nontender. No distention. Musculoskeletal: Nontender with normal range of motion in all extremities.  Neurologic:  Normal speech and language. No gross focal neurologic deficits Skin:  Skin is warm, dry and intact.  Psychiatric: Mood and affect are normal.   ____________________________________________    EKG  EKG viewed and interpreted by myself shows a normal sinus rhythm at 74 bpm with a narrow QRS, normal axis, normal intervals, no concerning ST changes.  ____________________________________________   INITIAL IMPRESSION / ASSESSMENT AND PLAN / ED COURSE  Pertinent labs & imaging results that were available during my care of the patient were reviewed by me and considered in my medical decision making (see chart for details).   Patient presents to the emergency department for a seizure.  History of seizures in the past.  We will check labs, urinalysis.  We will also obtain valproic acid level, IV hydrate and continue to closely monitor.  Patient agreeable to plan of care.  Patient's work-up is largely within normal limits including a therapeutic valproic acid level.  However patient has begun acting very abnormal, standing out of his bed and taking his mattress off,  attempting to break the bed and computer at times but does not appear to be aggressive appears to be more confused.  I have consulted psychiatry in TTS.  Psychiatry is seen the patient is familiar with the patient states after seizures he typically acts abnormal until he sleeps.  Given the patient's abnormal behavior, refusing to follow commands such as getting back in bed with dose 5 mg of Haldol and 1 mg of Ativan.  We will continue to monitor the patient in the emergency department till he returns to his baseline.  Patient currently sleeping.  Awakens to voice.  We will allow the patient to rest as long as the patient is acting normal lungs awake we will likely discharge home.  Patient care signed out to Dr. Alphonzo LemmingsMcShane.  Daniel Craig was evaluated in Emergency Department on 07/03/2019 for  the symptoms described in the history of present illness. He was evaluated in the context of the global COVID-19 pandemic, which necessitated consideration that the patient might be at risk for infection with the SARS-CoV-2 virus that causes COVID-19. Institutional protocols and algorithms that pertain to the evaluation of patients at risk for COVID-19 are in a state of rapid change based on information released by regulatory bodies including the CDC and federal and state organizations. These policies and algorithms were followed during the patient's care in the ED.  ____________________________________________   FINAL CLINICAL IMPRESSION(S) / ED DIAGNOSES  Seizure Abnormal behavior   Harvest Dark, MD 07/03/19 2041

## 2019-07-03 NOTE — ED Notes (Signed)
PT  VOL  PENDING  CONSULT 

## 2019-07-03 NOTE — ED Notes (Signed)
Patient attempting to pull monitor cords off at this time, pull mattress on bed out from under him, stuck his arm through railing hole down to floor, trying to pull out IV, and jumped out of bed. Patient not responding to staff at this time, just starring off around room. Patient wandering hallway. Patient redirected back to room. MD and Charge RN made aware. Sitter now present with patient.

## 2019-07-03 NOTE — ED Notes (Signed)
Patients worker on the way to hospital at this time

## 2019-07-03 NOTE — ED Notes (Signed)
Pt up to bathroom with steady gait.

## 2019-07-03 NOTE — ED Notes (Signed)

## 2019-07-03 NOTE — Discharge Instructions (Addendum)
Please continue to take your home seizure medications as prescribed by your doctor.  Please follow-up with your doctor in 1 to 2 days for recheck/reevaluation.  Return to the emergency department for any further seizure-like activity, or any other symptom personally concerning to yourself.  We prescribed a new seizure medication that may help with your symptoms.  It was sent to [facility] on [address] and should be available soon for pickup.  Please take it in addition to your other medications.

## 2019-07-03 NOTE — BH Assessment (Signed)
Assessment Note  Daniel Craig is an 28 y.o. male. Mr. Bumgarner arrived to the ED by way of EMS due to seizures.  He denied symptoms of depression.  He shared that he has some anxiety, due to him wanting a cigarette.  He denied having auditory or visual hallucinations.  He denied suicidal ideation or intent.  He denied homicidal ideation or intent.  He denied the use of alcohol, but reports use of marijuana daily.  He denied facing additional stressors.  Diagnosis:   Past Medical History:  Past Medical History:  Diagnosis Date  . Schizophrenia ([place])   . Seizures ([place])     Past Surgical History:  Procedure Laterality Date  . MOUTH SURGERY      Family History:  Family History  Family history unknown: Yes    Social History:  reports that he has been smoking cigarettes. He has been smoking about 1.00 pack per day. He has never used smokeless tobacco. He reports current alcohol use of about 4.0 standard drinks of alcohol per week. He reports current drug use. Drug: Marijuana.  Additional Social History:  Alcohol / Drug Use History of alcohol / drug use?: Yes Substance #1 Name of Substance 1: Marijuana 1 - Age of First Use: Unknown 1 - Amount (size/oz): Varied 1 - Frequency: Daily 1 - Last Use / Amount: 07/02/2019  CIWA: CIWA-Ar BP: (!) 101/54 Pulse Rate: (!) 55 COWS:    Allergies:  Allergies  Allergen Reactions  . Keppra [Levetiracetam]   . Trileptal [Oxcarbazepine]   . Vimpat [Lacosamide]     Home Medications: (Not in a hospital admission)   OB/GYN Status:  No LMP for male patient.  General Assessment Data Location of Assessment: [facility] ED TTS Assessment: In system Is this a Tele or Face-to-Face Assessment?: Face-to-Face Is this an Initial Assessment or a Re-assessment for this encounter?: Initial Assessment Patient Accompanied by:: N/A Language Other than English: No Living Arrangements: Other (Comment)(Private residence) What gender do you identify as?:  Male Marital status: Single Living Arrangements: Alone Can pt return to current living arrangement?: Yes Admission Status: Voluntary Is patient capable of signing voluntary admission?: Yes Referral Source: Self/Family/Friend Insurance type: Medicaid  Medical Screening Exam ([place]) Medical Exam completed: Yes  Crisis Care Plan Living Arrangements: Alone Legal Guardian: Other:(Self) Name of Psychiatrist: [facility] Name of Therapist: [facility]  Education Status Is patient currently in school?: No Is the patient employed, unemployed or receiving disability?: Receiving disability income  Risk to self with the past 6 months Suicidal Ideation: No Has patient been a risk to self within the past 6 months prior to admission? : No Suicidal Intent: No Has patient had any suicidal intent within the past 6 months prior to admission? : No Is patient at risk for suicide?: No Suicidal Plan?: No Has patient had any suicidal plan within the past 6 months prior to admission? : No Access to Means: No What has been your use of drugs/alcohol within the last 12 months?: marijuana Previous Attempts/Gestures: No How many times?: 0 Other Self Harm Risks: denied Triggers for Past Attempts: None known Intentional Self Injurious Behavior: None Family Suicide History: No Recent stressful life event(s): (Denied facing stressors) Persecutory voices/beliefs?: No Depression: No Depression Symptoms: (denied by patient) Substance abuse history and/or treatment for substance abuse?: Yes Suicide prevention information given to non-admitted patients: Not applicable  Risk to Others within the past 6 months Homicidal Ideation: No Does patient have any lifetime risk of violence toward  others beyond the six months prior to admission? : No Thoughts of Harm to Others: No Current Homicidal Intent: No Current Homicidal Plan: No Access to Homicidal Means: No Identified Victim: none  identified History of harm to others?: No Assessment of Violence: None Noted Violent Behavior Description: Denied by patient Does patient have access to weapons?: No Criminal Charges Pending?: No Does patient have a court date: No Is patient on probation?: No  Psychosis Hallucinations: None noted Delusions: None noted  Mental Status Report Appearance/Hygiene: In scrubs Eye Contact: Good Motor Activity: Unremarkable Speech: Logical/coherent Level of Consciousness: Alert Mood: Pleasant Affect: Appropriate to circumstance Anxiety Level: None Judgement: Partial Orientation: Appropriate for developmental age Obsessive Compulsive Thoughts/Behaviors: None  Cognitive Functioning Concentration: Normal Memory: Recent Intact Insight: Fair Impulse Control: Fair Appetite: Poor Have you had any weight changes? : No Change Sleep: No Change Vegetative Symptoms: None  ADLScreening [facility] Assessment Services) Patient's cognitive ability adequate to safely complete daily activities?: Yes Patient able to express need for assistance with ADLs?: Yes Independently performs ADLs?: Yes (appropriate for developmental age)  Prior Inpatient Therapy Prior Inpatient Therapy: Yes Prior Therapy Dates: 02/2018 [facility] Reason for Treatment: Auditory Hallucinations  Prior Outpatient Therapy Prior Outpatient Therapy: Yes Prior Therapy Dates: Current Prior Therapy Facilty/Provider(s): [facility] Reason for Treatment: Medication Management Does patient have an ACCT team?: Yes Does patient have Intensive In-House Services?  : No Does patient have [facility] services? : No Does patient have P4CC services?: No  ADL Screening (condition at time of admission) Patient's cognitive ability adequate to safely complete daily activities?: Yes Is the patient deaf or have difficulty hearing?: No Does the patient have difficulty seeing, even when wearing  glasses/contacts?: No Does the patient have difficulty concentrating, remembering, or making decisions?: No Patient able to express need for assistance with ADLs?: Yes Does the patient have difficulty dressing or bathing?: No Independently performs ADLs?: Yes (appropriate for developmental age) Does the patient have difficulty walking or climbing stairs?: No Weakness of Legs: None Weakness of Arms/Hands: None  Home Assistive Devices/Equipment Home Assistive Devices/Equipment: None    Abuse/Neglect Assessment (Assessment to be complete while patient is alone) Physical Abuse: Denies Verbal Abuse: Denies Sexual Abuse: Denies Exploitation of patient/patient's resources: Denies     Merchant navy officerAdvance Directives (For Healthcare) Does Patient Have a Medical Advance Directive?: No Would patient like information on creating a medical advance directive?: No - Patient declined          Disposition:  Disposition Initial Assessment Completed for this Encounter: Yes  On Site Evaluation by:   Reviewed with Physician:    Justice DeedsKeisha Natahlia Hoggard 07/03/2019 10:21 PM

## 2019-07-03 NOTE — ED Notes (Signed)
MD called to bedside to witness patients odd behavior. Patient in floor crawling around, playing with wheels on stretcher, trying to pull pieces of stretcher off. MD reported move patient to quad area. Patient being directed/walked to room 23

## 2019-07-03 NOTE — ED Notes (Signed)
ED  Is the patient under IVC or is there intent for IVC:  Voluntary  Is the patient medically cleared: Yes.   Is there vacancy in the ED BHU:  [place] Is the population mix appropriate for patient: Yes.   Is the patient awaiting placement in inpatient or outpatient setting: Yes.   Has the patient had a psychiatric consult: Yes.   Survey of unit performed for contraband, proper placement and condition of furniture, tampering with fixtures in bathroom, shower, and each patient room: Yes.  ; Findings:  APPEARANCE/BEHAVIOR Calm and cooperative NEURO ASSESSMENT Orientation: oriented x self   Denies pain Hallucinations:   Auditory and visual   Speech: Normal Gait: unsteady at times  RESPIRATORY ASSESSMENT Even  Unlabored respirations  CARDIOVASCULAR ASSESSMENT Pulses equal   regular rate  Skin warm and dry   GASTROINTESTINAL ASSESSMENT no GI complaint EXTREMITIES Full ROM  PLAN OF CARE Provide calm/safe environment. Vital signs assessed twice daily. ED BHU Assessment once each 12-hour shift. Collaborate with TTS daily or as condition indicates. Assure the ED provider has rounded once each shift. Provide and encourage hygiene. Provide redirection as needed. Assess for escalating behavior; address immediately and inform ED provider.  Assess family dynamic and appropriateness for visitation as needed: Yes.  ; If necessary, describe findings:  Educate the patient/family about BHU procedures/visitation: Yes.  ; If necessary, describe findings:

## 2019-07-03 NOTE — ED Notes (Signed)
Pt. Walked unassisted to bathroom, pt. Returned to bed with steady gait.

## 2019-07-04 LAB — SARS CORONAVIRUS 2 BY RT PCR (HOSPITAL ORDER, PERFORMED IN ~~LOC~~ HOSPITAL LAB): SARS Coronavirus 2: NEGATIVE

## 2019-07-04 MED ORDER — LAMOTRIGINE 100 MG PO TABS: 200.0000 mg | ORAL_TABLET | Freq: Every day | ORAL | Status: DC

## 2019-07-04 MED ORDER — ETHOSUXIMIDE 250 MG PO CAPS: 250.0000 mg | ORAL_CAPSULE | Freq: Two times a day (BID) | ORAL | 2 refills | Status: DC

## 2019-07-04 MED ORDER — LORAZEPAM 1 MG PO TABS: 1.0000 mg | ORAL_TABLET | Freq: Two times a day (BID) | ORAL | Status: DC | PRN

## 2019-07-04 MED ORDER — LEVOTHYROXINE SODIUM 25 MCG PO TABS: 50.0000 ug | ORAL_TABLET | Freq: Every day | ORAL | Status: DC

## 2019-07-04 MED ORDER — LAMOTRIGINE 100 MG PO TABS
100.0000 mg | ORAL_TABLET | Freq: Every day | ORAL | Status: DC
  Administered 2019-07-04: 100 mg via ORAL
  Filled 2019-07-04: qty 1

## 2019-07-04 MED ORDER — RISPERIDONE 1 MG PO TABS
1.0000 mg | ORAL_TABLET | Freq: Two times a day (BID) | ORAL | Status: DC
  Administered 2019-07-04: 1 mg via ORAL
  Filled 2019-07-04: qty 1

## 2019-07-04 MED ORDER — DIVALPROEX SODIUM ER 500 MG PO TB24
500.0000 mg | ORAL_TABLET | Freq: Every day | ORAL | Status: DC
  Administered 2019-07-04: 500 mg via ORAL
  Filled 2019-07-04: qty 1

## 2019-07-04 MED ORDER — LAMOTRIGINE 100 MG PO TABS: 100.0000 mg | ORAL_TABLET | ORAL | Status: DC

## 2019-07-04 MED ORDER — DIVALPROEX SODIUM ER 500 MG PO TB24: 500.0000 mg | ORAL_TABLET | ORAL | Status: DC

## 2019-07-04 MED ORDER — DIVALPROEX SODIUM ER 500 MG PO TB24: 1000.0000 mg | ORAL_TABLET | Freq: Every day | ORAL | Status: DC

## 2019-07-04 MED ORDER — PROPRANOLOL HCL ER 60 MG PO CP24
60.0000 mg | ORAL_CAPSULE | Freq: Every day | ORAL | Status: DC
  Filled 2019-07-04: qty 1

## 2019-07-04 NOTE — ED Notes (Signed)
SOC called report given, [place] machine set up in patient room, pt. Ready.

## 2019-07-04 NOTE — ED Notes (Signed)
Pt to be observed overnight and discharged in the a.m.

## 2019-07-04 NOTE — ED Notes (Signed)
This Probation officer called patient's father (legal guardian). Mr. Boehle knows patient will be discharged with ACT team.

## 2019-07-04 NOTE — ED Notes (Signed)
RN called legal guardian. He is agreeable to patient leaving in a cab.

## 2019-07-04 NOTE — ED Provider Notes (Signed)
-----------------------------------------   1:54 AM on 07/04/2019 -----------------------------------------   Blood pressure (!) 101/54, pulse (!) 55, temperature 98.1 F (36.7 C), temperature source Oral, resp. rate 16, height 1.829 m (6'), weight 98.9 kg, SpO2 96 %.  The patient is sleeping at this time.  He was evaluated by tele-psychiatry who feels the patient does not meet criteria for inpatient treatment and has rescinded his involuntary commitment.  He recommends watching the patient overnight and discharge in the morning when someone can pick him up.  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  I personally went familiar with this medication but the psychiatrist recommended it so I will write a prescription for it to see if this helps him with his frequent ED visits.  The pharmacist also specifically reviewed the patient's allergies and did not express concerns over the listed allergy to Trileptal, which was flagged by [facility] as a potential issue, but I will proceed with the recommendations from the specialist.  The prescription was sent electronically.   Hinda Kehr, MD 07/04/19 0157

## 2019-07-04 NOTE — ED Notes (Signed)
Meal tray placed in room 

## 2019-07-04 NOTE — ED Notes (Signed)
[facility] doctor called back to report that he believes pt. Was probably post-ictal.  SOC recommends observing pt. Tonight with possible discharge or re-evalve in morning.

## 2019-07-04 NOTE — ED Notes (Signed)
[place] speaking with pt.

## 2019-07-04 NOTE — ED Notes (Signed)
Pt discharged home with cab voucher. Legal guardian made aware of disquisition and cab voucher. VS stable. All belongings returned to patient. Discharge instructions and prescription reviewed with patient.  Patient signed for discharge. Pt denies SI/HI and pain.

## 2019-07-14 ENCOUNTER — Other Ambulatory Visit: Payer: Self-pay

## 2019-08-06 ENCOUNTER — Emergency Department
Admission: EM | Admit: 2019-08-06 | Discharge: 2019-08-06 | Disposition: A | Discharge status: Home / Self Care | Payer: Medicaid Other | Attending: Student in an Organized Health Care Education/Training Program | Admitting: Student in an Organized Health Care Education/Training Program

## 2019-08-06 ENCOUNTER — Emergency Department: Payer: Medicaid Other

## 2019-08-06 ENCOUNTER — Other Ambulatory Visit: Payer: Self-pay

## 2019-08-06 ENCOUNTER — Inpatient Hospital Stay
Admission: EM | Admit: 2019-08-06 | Discharge: 2019-08-10 | DRG: 101 | Disposition: A | Discharge status: Home Health | Payer: Medicaid Other | Attending: Internal Medicine | Admitting: Internal Medicine

## 2019-08-06 DIAGNOSIS — J324 Chronic pansinusitis: Secondary | ICD-10-CM | POA: Diagnosis present

## 2019-08-06 DIAGNOSIS — W06XXXA Fall from bed, initial encounter: Secondary | ICD-10-CM | POA: Diagnosis not present

## 2019-08-06 DIAGNOSIS — Z79890 Hormone replacement therapy (postmenopausal): Secondary | ICD-10-CM

## 2019-08-06 DIAGNOSIS — Y92230 Patient room in hospital as the place of occurrence of the external cause: Secondary | ICD-10-CM | POA: Diagnosis not present

## 2019-08-06 DIAGNOSIS — F1210 Cannabis abuse, uncomplicated: Secondary | ICD-10-CM | POA: Diagnosis present

## 2019-08-06 DIAGNOSIS — R569 Unspecified convulsions: Secondary | ICD-10-CM | POA: Diagnosis not present

## 2019-08-06 DIAGNOSIS — Z20828 Contact with and (suspected) exposure to other viral communicable diseases: Secondary | ICD-10-CM | POA: Diagnosis not present

## 2019-08-06 DIAGNOSIS — R296 Repeated falls: Secondary | ICD-10-CM | POA: Diagnosis present

## 2019-08-06 DIAGNOSIS — Z79899 Other long term (current) drug therapy: Secondary | ICD-10-CM

## 2019-08-06 DIAGNOSIS — F209 Schizophrenia, unspecified: Secondary | ICD-10-CM | POA: Diagnosis present

## 2019-08-06 DIAGNOSIS — G40909 Epilepsy, unspecified, not intractable, without status epilepticus: Principal | ICD-10-CM | POA: Diagnosis present

## 2019-08-06 DIAGNOSIS — Z888 Allergy status to other drugs, medicaments and biological substances status: Secondary | ICD-10-CM

## 2019-08-06 DIAGNOSIS — Z6829 Body mass index (BMI) 29.0-29.9, adult: Secondary | ICD-10-CM

## 2019-08-06 DIAGNOSIS — F17210 Nicotine dependence, cigarettes, uncomplicated: Secondary | ICD-10-CM | POA: Insufficient documentation

## 2019-08-06 DIAGNOSIS — N179 Acute kidney failure, unspecified: Secondary | ICD-10-CM | POA: Diagnosis present

## 2019-08-06 DIAGNOSIS — E663 Overweight: Secondary | ICD-10-CM | POA: Diagnosis present

## 2019-08-06 DIAGNOSIS — R2681 Unsteadiness on feet: Secondary | ICD-10-CM | POA: Diagnosis present

## 2019-08-06 DIAGNOSIS — H5500 Unspecified nystagmus: Secondary | ICD-10-CM | POA: Diagnosis present

## 2019-08-06 LAB — CBC WITH DIFFERENTIAL/PLATELET
Abs Immature Granulocytes: 0.12 10*3/uL — ABNORMAL HIGH (ref 0.00–0.07)
Basophils Absolute: 0.1 10*3/uL (ref 0.0–0.1)
Basophils Relative: 0 %
Eosinophils Absolute: 0 10*3/uL (ref 0.0–0.5)
Eosinophils Relative: 0 %
HCT: 41.8 % (ref 39.0–52.0)
Hemoglobin: 14.4 g/dL (ref 13.0–17.0)
Immature Granulocytes: 1 %
Lymphocytes Relative: 10 %
Lymphs Abs: 1.8 10*3/uL (ref 0.7–4.0)
MCH: 30.6 pg (ref 26.0–34.0)
MCHC: 34.4 g/dL (ref 30.0–36.0)
MCV: 88.7 fL (ref 80.0–100.0)
Monocytes Absolute: 2.5 10*3/uL — ABNORMAL HIGH (ref 0.1–1.0)
Monocytes Relative: 15 %
Neutro Abs: 12.9 10*3/uL — ABNORMAL HIGH (ref 1.7–7.7)
Neutrophils Relative %: 74 %
Platelets: 187 10*3/uL (ref 150–400)
RBC: 4.71 MIL/uL (ref 4.22–5.81)
RDW: 12 % (ref 11.5–15.5)
WBC: 17.4 10*3/uL — ABNORMAL HIGH (ref 4.0–10.5)
nRBC: 0 % (ref 0.0–0.2)

## 2019-08-06 LAB — COMPREHENSIVE METABOLIC PANEL
ALT: 11 U/L (ref 0–44)
AST: 28 U/L (ref 15–41)
Albumin: 4.3 g/dL (ref 3.5–5.0)
Alkaline Phosphatase: 41 U/L (ref 38–126)
Anion gap: 12 (ref 5–15)
BUN: 11 mg/dL (ref 6–20)
CO2: 22 mmol/L (ref 22–32)
Calcium: 9.6 mg/dL (ref 8.9–10.3)
Chloride: 99 mmol/L (ref 98–111)
Creatinine, Ser: 1.27 mg/dL — ABNORMAL HIGH (ref 0.61–1.24)
GFR calc Af Amer: >60 mL/min (ref >60)
GFR calc non Af Amer: >60 mL/min (ref >60)
Glucose, Bld: 125 mg/dL — ABNORMAL HIGH (ref 70–99)
Potassium: 4 mmol/L (ref 3.5–5.1)
Sodium: 133 mmol/L — ABNORMAL LOW (ref 135–145)
Total Bilirubin: 0.9 mg/dL (ref 0.3–1.2)
Total Protein: 7.8 g/dL (ref 6.5–8.1)

## 2019-08-06 LAB — URINE DRUG SCREEN, QUALITATIVE (ARMC ONLY)
Amphetamines, Ur Screen: NOT DETECTED
Barbiturates, Ur Screen: NOT DETECTED
Benzodiazepine, Ur Scrn: NOT DETECTED
Cannabinoid 50 Ng, Ur ~~LOC~~: POSITIVE — AB
Cocaine Metabolite,Ur ~~LOC~~: NOT DETECTED
MDMA (Ecstasy)Ur Screen: NOT DETECTED
Methadone Scn, Ur: NOT DETECTED
Opiate, Ur Screen: NOT DETECTED
Phencyclidine (PCP) Ur S: NOT DETECTED
Tricyclic, Ur Screen: NOT DETECTED

## 2019-08-06 LAB — URINALYSIS, COMPLETE (UACMP) WITH MICROSCOPIC
Bacteria, UA: NONE SEEN
Bilirubin Urine: NEGATIVE
Glucose, UA: NEGATIVE mg/dL
Hgb urine dipstick: NEGATIVE
Ketones, ur: 80 mg/dL — AB
Leukocytes,Ua: NEGATIVE
Nitrite: NEGATIVE
Protein, ur: NEGATIVE mg/dL
Specific Gravity, Urine: 1.02 (ref 1.005–1.030)
pH: 7 (ref 5.0–8.0)

## 2019-08-06 LAB — VALPROIC ACID LEVEL: Valproic Acid Lvl: 83 ug/mL (ref 50.0–100.0)

## 2019-08-06 LAB — SARS CORONAVIRUS 2 BY RT PCR (HOSPITAL ORDER, PERFORMED IN ~~LOC~~ HOSPITAL LAB): SARS Coronavirus 2: NEGATIVE

## 2019-08-06 LAB — GLUCOSE, CAPILLARY: Glucose-Capillary: 94 mg/dL (ref 70–99)

## 2019-08-06 MED ORDER — TETANUS-DIPHTH-ACELL PERTUSSIS 5-2.5-18.5 LF-MCG/0.5 IM SUSP
0.5000 mL | Freq: Once | INTRAMUSCULAR | Status: AC
  Administered 2019-08-06: 23:00:00 0.5 mL via INTRAMUSCULAR
  Filled 2019-08-06: qty 0.5

## 2019-08-06 MED ORDER — PROMETHAZINE HCL 25 MG/ML IJ SOLN
12.5000 mg | Freq: Four times a day (QID) | INTRAMUSCULAR | Status: DC | PRN
  Administered 2019-08-06: 16:00:00 12.5 mg via INTRAVENOUS
  Filled 2019-08-06: qty 1

## 2019-08-06 MED ORDER — SODIUM CHLORIDE 0.9 % IV BOLUS
1000.0000 mL | Freq: Once | INTRAVENOUS | Status: AC
  Administered 2019-08-06: 20:00:00 1000 mL via INTRAVENOUS

## 2019-08-06 MED ORDER — LORAZEPAM 2 MG/ML IJ SOLN
1.0000 mg | Freq: Once | INTRAMUSCULAR | Status: DC
  Filled 2019-08-06: qty 1

## 2019-08-06 MED ORDER — ACETAMINOPHEN 500 MG PO TABS
1000.0000 mg | ORAL_TABLET | Freq: Once | ORAL | Status: AC
  Administered 2019-08-06: 1000 mg via ORAL
  Filled 2019-08-06: qty 2

## 2019-08-06 MED ORDER — LAMOTRIGINE 100 MG PO TABS
200.0000 mg | ORAL_TABLET | Freq: Once | ORAL | Status: AC
  Administered 2019-08-06: 23:00:00 200 mg via ORAL
  Filled 2019-08-06: qty 2

## 2019-08-06 MED ORDER — SODIUM CHLORIDE 0.9 % IV BOLUS
1000.0000 mL | Freq: Once | INTRAVENOUS | Status: AC
  Administered 2019-08-07: 1000 mL via INTRAVENOUS

## 2019-08-06 MED ORDER — DIVALPROEX SODIUM 500 MG PO DR TAB
500.0000 mg | DELAYED_RELEASE_TABLET | Freq: Once | ORAL | Status: AC
  Administered 2019-08-06: 500 mg via ORAL
  Filled 2019-08-06: qty 1

## 2019-08-06 MED ORDER — SODIUM CHLORIDE 0.9 % IV SOLN
2000.0000 mg | Freq: Once | INTRAVENOUS | Status: AC
  Administered 2019-08-07: 2000 mg via INTRAVENOUS
  Filled 2019-08-06: qty 40

## 2019-08-06 MED ORDER — LORAZEPAM 2 MG/ML IJ SOLN
1.0000 mg | Freq: Once | INTRAMUSCULAR | Status: AC
  Administered 2019-08-06: 23:00:00 1 mg via INTRAVENOUS
  Filled 2019-08-06: qty 1

## 2019-08-06 NOTE — ED Notes (Signed)
This RN spoke to [employer], pt's legal guardian. Legal guardian states that it is ok to have pt take cab. Verified pt's address with pt's guardian. Cab voucher given to pt.

## 2019-08-06 NOTE — ED Notes (Signed)
Pt removing bp  Cuff regularly. Pt reminded to leave bp cuff on

## 2019-08-06 NOTE — ED Triage Notes (Signed)
Pt from home post seizure. Witnessed by friends. Pt has hx of seizures, pt sates he took his seizure medicine last night.

## 2019-08-06 NOTE — ED Triage Notes (Signed)
Pt was just d/c'd and awaiting cab ride home when he was observed by visitors having a seizure just outside the lobby. Pt found on the ground, bleeding from chin and abrasion to bilateral knees. Pt immediately taken to ED Rm 24 with Dr Jari Pigg at bedside.

## 2019-08-06 NOTE — ED Notes (Signed)
Daniel Craig called at this time to arrange for post d/c transportation via voucher. Craig driver states he will be here in "30-45 minutes".

## 2019-08-06 NOTE — ED Provider Notes (Signed)
[facility] Emergency Department Provider Note  ____________________________________________   First MD Initiated Contact with Patient 08/06/19 2215     (approximate)  I have reviewed the triage vital signs and the nursing notes.   HISTORY  Chief Complaint Seizure   HPI Daniel Craig is a 28 y.o. male with schizophrenia and seizure disorder on Lamictal, Depakote who presents for seizure.  Patient was just seen by off going doctor for a witnessed seizure lasting approximately 5 minutes that was generalized tonic-clonic.  Per the facility has been compliant with his medications and is typical for him to occasionally have seizures.  Patient was noted to have a slightly elevated white count but his infectious work-up was otherwise negative.  CT head was negative.  Patient was back to baseline and ambulating and he was arranging for a cab ride back to his group home.  While he was outside he had another seizure.  It was unwitnessed.  Unclear how long it lasted.  Seizure had stopped upon the time I by the time I had assessed patient but he is currently postictal.  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  Authorizing Provider  Ascorbic Acid (VITAMIN C) 1000 MG tablet Take 1,000 mg by mouth daily.    [provider]  divalproex (DEPAKOTE ER) 500 MG 24 hr tablet Take 500-1,000 mg by mouth See admin instructions. 500 mg every morning and 1000 mg at bedtime    [provider]  ethosuximide (ZARONTIN) 250 MG capsule Take 1 capsule (250 mg total) by mouth 2 (two) times daily. 07/04/19 10/02/19  Patrecia Pour, NP  lamoTRIgine (LAMICTAL) 100 MG tablet Take 100-200 mg by mouth See admin instructions. 100 mg every morning and 200 mg at bedtime    [provider]  levothyroxine (SYNTHROID) 50 MCG tablet Take 50 mcg by mouth daily before breakfast.    [provider]  LORazepam (ATIVAN) 1 MG tablet Take 1 tablet (1 mg total) by mouth 2 (two) times daily as needed for seizure (or shaking). 01/06/17   Daymon Larsen, MD  meloxicam (MOBIC) 15 MG tablet Take 1 tablet (15 mg total) by mouth daily as needed for pain. 11/11/18   Gladstone Lighter, MD  OLANZapine (ZYPREXA) 5 MG tablet Take 5 mg by mouth at bedtime.     [provider]  propranolol ER (INDERAL LA) 60 MG 24 hr capsule Take 60 mg by mouth daily.    [provider]  risperiDONE (RISPERDAL) 3 MG tablet Take 1 mg by mouth 2 (two) times daily.  10/01/16   [provider]  traZODone (DESYREL) 150 MG tablet Take 150 mg by mouth at bedtime.    [provider]  Vitamin D, Ergocalciferol, (DRISDOL) 50000 units CAPS capsule Take 50,000 Units by mouth every 7 (seven) days.    [provider]    Allergies Keppra [levetiracetam], Trileptal [oxcarbazepine], and Vimpat [lacosamide]  Family History  Family history unknown: Yes    Social History Social History   Tobacco Use   Smoking status: Current Every Day Smoker    Packs/day: 1.00    Types: Cigarettes   Smokeless tobacco: Never Used  Substance Use Topics   Alcohol use: Yes    Alcohol/week: 4.0 standard drinks    Types: 4  Shots of liquor per week    Comment: pt states 4 shots of liqour oer week   Drug use: Yes    Types: Marijuana      Review of Systems Unable to get review of systems due to postictal ____________________________________________   PHYSICAL EXAM:  VITAL SIGNS: Blood pressure (!) 99/54, pulse 68, resp. rate 16, SpO2 99 %.   Constitutional: Alert and looking around the room Eyes: Conjunctivae are normal. EOMI. Head: Abrasions on the head from the fall Nose: No congestion/rhinnorhea. Mouth/Throat: Mucous membranes are moist.   Neck: No stridor. Trachea Midline. FROM Cardiovascular: Normal rate, regular rhythm. Grossly normal heart sounds.  Good peripheral circulation. Respiratory: Normal respiratory effort.  No retractions. Lungs CTAB. Gastrointestinal: Soft and nontender. No distention. No abdominal bruits.  Musculoskeletal: No lower extremity tenderness nor edema.  No joint effusions.  Abrasion on the knee Neurologic: Alerted looked around, MAEW, no gross deficits.  Skin:  Skin is warm, dry and intact. No rash noted. Psychiatric: unable to assess due to post ictal GU: Deferred   ____________________________________________   LABS (all labs ordered are listed, but only abnormal results are displayed)  Labs Reviewed  GLUCOSE, CAPILLARY  LAMOTRIGINE LEVEL  CBG MONITORING, ED   ____________________________________________   ED ECG REPORT I, Concha Se, the attending physician, personally viewed and interpreted this ECG.  Normal sinus rate of 91, no st elevation, no twi, normal intervals.  ____________________________________________  RADIOLOGY Vela Prose, personally viewed and evaluated these images (plain radiographs) as part of my medical decision making, as well as reviewing the written report by the radiologist.  ED MD interpretation:  Reviewed CT no head bleed.   Official radiology report(s): Ct Head Wo Contrast  Result Date: 08/06/2019 CLINICAL DATA:   Recently discharged with awaiting cab ride, found having seizure EXAM: CT HEAD WITHOUT CONTRAST; CT CERVICAL SPINE WITHOUT CONTRAST TECHNIQUE: Contiguous axial images were obtained from the base of the skull through the vertex without intravenous contrast. COMPARISON:  CT head same day FINDINGS: Brain: No evidence of acute territorial infarction, hemorrhage, hydrocephalus,extra-axial collection or mass lesion/mass effect. Normal gray-white differentiation. Ventricles are normal in size and contour. Vascular: No hyperdense vessel or unexpected calcification. Skull: The skull is intact. No fracture or focal lesion identified. Sinuses/Orbits: There is fluid seen within the right maxillary sinus and ethmoid air cells. The orbits and globes intact. Other: None Cervical spine: Alignment: There is straightening of the normal cervical lordosis. Skull base and vertebrae: Visualized skull base is intact. No atlanto-occipital dissociation. The vertebral body heights are well maintained. Anterior osteophytes seen at C4-C5. No fracture or pathologic osseous lesion seen. Soft tissues and spinal canal: The visualized paraspinal soft tissues are unremarkable. No prevertebral soft tissue swelling is seen. The spinal canal is grossly unremarkable, no large epidural collection or significant canal narrowing. Disc levels:  No significant canal or neural foraminal narrowing. Upper chest: The  lung apices are clear. Thoracic inlet is within normal limits. Other: None IMPRESSION: No acute intracranial abnormality. Right maxillary and ethmoid sinusitis No acute fracture or malalignment of the spine. Electronically Signed   By: Jonna ClarkBindu  Avutu M.D.   On: 08/06/2019 23:52   Ct Head Wo Contrast  Result Date: 08/06/2019 CLINICAL DATA:  28 year old male with seizures. No trauma. EXAM: CT HEAD WITHOUT CONTRAST TECHNIQUE: Contiguous axial images were obtained from the base of the skull through the vertex without intravenous contrast. COMPARISON:   Head CT dated 11/04/2018 FINDINGS: Brain: No evidence of acute infarction, hemorrhage, hydrocephalus, extra-axial collection or mass lesion/mass effect. Vascular: No hyperdense vessel or unexpected calcification. Skull: Normal. Negative for fracture or focal lesion. Sinuses/Orbits: There is opacification of the visualized right maxillary sinus and multiple right ethmoid air cells. The mastoid air cells are clear. Other: None IMPRESSION: 1. Normal unenhanced CT of the brain. 2. Paranasal sinus disease. Electronically Signed   By: Elgie CollardArash  Radparvar M.D.   On: 08/06/2019 18:02   Ct Cervical Spine Wo Contrast  Result Date: 08/06/2019 CLINICAL DATA:  Recently discharged with awaiting cab ride, found having seizure EXAM: CT HEAD WITHOUT CONTRAST; CT CERVICAL SPINE WITHOUT CONTRAST TECHNIQUE: Contiguous axial images were obtained from the base of the skull through the vertex without intravenous contrast. COMPARISON:  CT head same day FINDINGS: Brain: No evidence of acute territorial infarction, hemorrhage, hydrocephalus,extra-axial collection or mass lesion/mass effect. Normal gray-white differentiation. Ventricles are normal in size and contour. Vascular: No hyperdense vessel or unexpected calcification. Skull: The skull is intact. No fracture or focal lesion identified. Sinuses/Orbits: There is fluid seen within the right maxillary sinus and ethmoid air cells. The orbits and globes intact. Other: None Cervical spine: Alignment: There is straightening of the normal cervical lordosis. Skull base and vertebrae: Visualized skull base is intact. No atlanto-occipital dissociation. The vertebral body heights are well maintained. Anterior osteophytes seen at C4-C5. No fracture or pathologic osseous lesion seen. Soft tissues and spinal canal: The visualized paraspinal soft tissues are unremarkable. No prevertebral soft tissue swelling is seen. The spinal canal is grossly unremarkable, no large epidural collection or  significant canal narrowing. Disc levels:  No significant canal or neural foraminal narrowing. Upper chest: The lung apices are clear. Thoracic inlet is within normal limits. Other: None IMPRESSION: No acute intracranial abnormality. Right maxillary and ethmoid sinusitis No acute fracture or malalignment of the spine. Electronically Signed   By: Jonna ClarkBindu  Avutu M.D.   On: 08/06/2019 23:52   Dg Chest Portable 1 View  Result Date: 08/06/2019 CLINICAL DATA:  Seizure, temperature, pneumonia EXAM: PORTABLE CHEST 1 VIEW COMPARISON:  02/06/2019 FINDINGS: The heart size and mediastinal contours are within normal limits. Both lungs are clear. The visualized skeletal structures are unremarkable. IMPRESSION: No acute abnormality of the lungs in AP portable projection. Electronically Signed   By: Lauralyn PrimesAlex  Bibbey M.D.   On: 08/06/2019 17:34    ____________________________________________   PROCEDURES  Procedure(s) performed (including Critical Care):  .Critical Care Performed by: Concha SeFunke, Kajal Scalici E, MD Authorized by: Concha SeFunke, Eden Rho E, MD   Critical care provider statement:    Critical care time (minutes):  30   Critical care was necessary to treat or prevent imminent or life-threatening deterioration of the following conditions:  CNS failure or compromise   Critical care was time spent personally by me on the following activities:  Discussions with consultants, evaluation of patient's response to treatment, examination of patient, ordering and performing treatments and interventions, ordering and review  of laboratory studies, ordering and review of radiographic studies, pulse oximetry, re-evaluation of patient's condition, obtaining history from patient or surrogate and review of old charts     ____________________________________________   INITIAL IMPRESSION / ASSESSMENT AND PLAN / ED COURSE  Daniel Craig was evaluated in Emergency Department on 08/06/2019 for the symptoms described in the history of  present illness. He was evaluated in the context of the global COVID-19 pandemic, which necessitated consideration that the patient might be at risk for infection with the SARS-CoV-2 virus that causes COVID-19. Institutional protocols and algorithms that pertain to the evaluation of patients at risk for COVID-19 are in a state of rapid change based on information released by regulatory bodies including the CDC and federal and state organizations. These policies and algorithms were followed during the patient's care in the ED.    Pt with a second seizure after coming back to baseline. Still jittery without evidence of continued seizing but will get 1mg  ativan to ensure does not seize again.  Most likely from seizure history. Elevated white count earlier but septic workup was negative. Will d/w neuro .  NO evidence of uti, pna. Normal sugar so unlikely due to hypoglycemia. CT head to rule out ICH from the fall.     10:56 PM d/w Neuro [place] Recommend lamictal 150 in the morning  200 now.  Will order the now dose.  Then recommend load of phenytoin but do not give additional  Pt back to baseline.  Says he last remembers waiting for cab.   Ct head and ct neck negative.  D/w hospital team for admission for seizures.  ____________________________________________   FINAL CLINICAL IMPRESSION(S) / ED DIAGNOSES   Final diagnoses:  Seizure ([facility])      MEDICATIONS GIVEN DURING THIS VISIT:  Medications  phenytoin (DILANTIN) 2,000 mg in sodium chloride 0.9 % 250 mL IVPB (has no administration in time range)  LORazepam (ATIVAN) injection 1 mg (1 mg Intravenous Given 08/06/19 2235)  Tdap (BOOSTRIX) injection 0.5 mL (0.5 mLs Intramuscular Given 08/06/19 2315)  lamoTRIgine (LAMICTAL) tablet 200 mg (200 mg Oral Given 08/06/19 2315)  sodium chloride 0.9 % bolus 1,000 mL (1,000 mLs Intravenous New Bag/Given 08/07/19 0003)     ED Discharge Orders    None       Note:  This document was prepared using  Dragon voice recognition software and may include unintentional dictation errors.   Concha SeFunke, Laurian Edrington E, MD 08/07/19 0040

## 2019-08-06 NOTE — ED Notes (Signed)
Pt returned from CT. Pt tolerated well. Lights off to enhance rest. No distress noted at this time. Seizure pads in place on stretcher and call bell within reach. Will continue to assess.

## 2019-08-06 NOTE — ED Notes (Signed)
Pt returned from CT without scan performed. CT tech Pam states pt would not cooperate. MD informed.

## 2019-08-06 NOTE — ED Notes (Signed)
Pt to CT

## 2019-08-06 NOTE — ED Notes (Signed)
Pt continues to remove bp cuff and monitors while bp cuff inflates. Pt reminded to leave monitors on

## 2019-08-06 NOTE — ED Provider Notes (Signed)
[facility] Emergency Department Provider Note    First MD Initiated Contact with Patient 08/06/19 1531     (approximate)  I have reviewed the triage vital signs and the nursing notes.   HISTORY  Chief Complaint Seizures    HPI Daniel Craig is a 28 y.o. male plosive past medical history presents the ER from group home after having witnessed seizure activity lasting roughly 5 minutes of generalized tonic-clonic episode followed waiting by nausea and vomiting.  This is typical for the patient.  States his been compliant with his medications.  Denies any pain anywhere.  No recent fevers.   Has had some intermittent congestion.  No cough or shortness of breath.   Past Medical History:  Diagnosis Date  . Schizophrenia (HCC)   . Seizures (HCC)    Family History  Family history unknown: Yes   Past Surgical History:  Procedure Laterality Date  . MOUTH SURGERY     Patient Active Problem List   Diagnosis Date Noted  . Extremely severe auditory hallucinations 06/02/2019  . Hypoglycemia 11/09/2018  . Schizophrenia, paranoid (HCC) 02/27/2018  . Cannabis use disorder, moderate, dependence (HCC) 02/27/2018  . Tobacco use disorder 02/27/2018  . 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 Altered mental status 12/28/2017  . Fever 08/11/2017  . Acute URI 08/11/2017  . Delirium 06/25/2017  . Schizophrenia (HCC) 06/25/2017  . Seizures (HCC) 06/25/2017      Prior to Admission medications   Medication Sig Start Date End Date Taking? Authorizing Provider  Ascorbic Acid (VITAMIN C) 1000 MG tablet Take 1,000 mg by mouth daily.    [provider]  divalproex (DEPAKOTE ER) 500 MG 24 hr tablet Take 500-1,000 mg by mouth See admin instructions. 500 mg every morning and 1000 mg at bedtime    [provider]  ethosuximide (ZARONTIN) 250 MG capsule Take 1 capsule (250 mg total) by mouth 2 (two) times daily. 07/04/19 10/02/19  Charm RingsLord, Jamison Y, NP  lamoTRIgine  (LAMICTAL) 100 MG tablet Take 100-200 mg by mouth See admin instructions. 100 mg every morning and 200 mg at bedtime    [provider]  levothyroxine (SYNTHROID) 50 MCG tablet Take 50 mcg by mouth daily before breakfast.    [provider]  LORazepam (ATIVAN) 1 MG tablet Take 1 tablet (1 mg total) by mouth 2 (two) times daily as needed for seizure (or shaking). 01/06/17   Jennye MoccasinQuigley, Brian S, MD  meloxicam (MOBIC) 15 MG tablet Take 1 tablet (15 mg total) by mouth daily as needed for pain. 11/11/18   Enid BaasKalisetti, Radhika, MD  OLANZapine (ZYPREXA) 5 MG tablet Take 5 mg by mouth at bedtime.     [provider]  propranolol ER (INDERAL LA) 60 MG 24 hr capsule Take 60 mg by mouth daily.    [provider]  risperiDONE (RISPERDAL) 3 MG tablet Take 1 mg by mouth 2 (two) times daily.  10/01/16   [provider]  traZODone (DESYREL) 150 MG tablet Take 150 mg by mouth at bedtime.    [provider]  Vitamin D, Ergocalciferol, (DRISDOL) 50000 units CAPS capsule Take 50,000 Units by mouth every 7 (seven) days.    [provider]    Allergies Keppra [levetiracetam], Trileptal [oxcarbazepine], and Vimpat [lacosamide]    Social History Social History   Tobacco Use  . Smoking status: Current Every Day Smoker    Packs/day: 1.00    Types: Cigarettes  . Smokeless tobacco: Never Used  Substance  Use Topics  . Alcohol use: Yes    Alcohol/week: 4.0 standard drinks    Types: 4 Shots of liquor per week    Comment: pt states 4 shots of liqour oer week  . Drug use: Yes    Types: Marijuana    Review of Systems Patient denies headaches, rhinorrhea, blurry vision, numbness, shortness of breath, chest pain, edema, cough, abdominal pain, nausea, vomiting, diarrhea, dysuria, fevers, rashes or hallucinations unless otherwise stated above in HPI. ____________________________________________   PHYSICAL EXAM:  VITAL SIGNS: Vitals:   08/06/19 1946 08/06/19  2033  BP: 122/64 111/71  Pulse:    Resp: (!) 27 19  Temp:    SpO2:      Constitutional:post ictal but follows commands.    Eyes: Conjunctivae are normal.  Head: Atraumatic. Nose: No congestion/rhinnorhea. Mouth/Throat: Mucous membranes are moist.   Neck: No stridor. Painless ROM.  Cardiovascular: Normal rate, regular rhythm. Grossly normal heart sounds.  Good peripheral circulation. Respiratory: Normal respiratory effort.  No retractions. Lungs CTAB. Gastrointestinal: Soft and nontender. No distention. No abdominal bruits. No CVA tenderness. Genitourinary:  Musculoskeletal: No lower extremity tenderness nor edema.  No joint effusions. Neurologic:  Normal speech and language. No gross focal neurologic deficits are appreciated. No facial droop.  Skin:  Skin is warm, dry and intact. No rash noted. Psychiatric: Mood and affect are normal. Speech and behavior are normal.  ____________________________________________   LABS (all labs ordered are listed, but only abnormal results are displayed)  Results for orders placed or performed during the hospital encounter of 08/06/19 (from the past 24 hour(s))  CBC with Differential/Platelet     Status: Abnormal   Collection Time: 08/06/19  4:01 PM  Result Value Ref Range   WBC 17.4 (H) 4.0 - 10.5 K/uL   RBC 4.71 4.22 - 5.81 MIL/uL   Hemoglobin 14.4 13.0 - 17.0 g/dL   HCT 57.841.8 46.939.0 - 62.952.0 %   MCV 88.7 80.0 - 100.0 fL   MCH 30.6 26.0 - 34.0 pg   MCHC 34.4 30.0 - 36.0 g/dL   RDW 52.812.0 41.311.5 - 24.415.5 %   Platelets 187 150 - 400 K/uL   nRBC 0.0 0.0 - 0.2 %   Neutrophils Relative % 74 %   Neutro Abs 12.9 (H) 1.7 - 7.7 K/uL   Lymphocytes Relative 10 %   Lymphs Abs 1.8 0.7 - 4.0 K/uL   Monocytes Relative 15 %   Monocytes Absolute 2.5 (H) 0.1 - 1.0 K/uL   Eosinophils Relative 0 %   Eosinophils Absolute 0.0 0.0 - 0.5 K/uL   Basophils Relative 0 %   Basophils Absolute 0.1 0.0 - 0.1 K/uL   Immature Granulocytes 1 %   Abs Immature Granulocytes  0.12 (H) 0.00 - 0.07 K/uL  Comprehensive metabolic panel     Status: Abnormal   Collection Time: 08/06/19  4:01 PM  Result Value Ref Range   Sodium 133 (L) 135 - 145 mmol/L   Potassium 4.0 3.5 - 5.1 mmol/L   Chloride 99 98 - 111 mmol/L   CO2 22 22 - 32 mmol/L   Glucose, Bld 125 (H) 70 - 99 mg/dL   BUN 11 6 - 20 mg/dL   Creatinine, Ser 0.101.27 (H) 0.61 - 1.24 mg/dL   Calcium 9.6 8.9 - 27.210.3 mg/dL   Total Protein 7.8 6.5 - 8.1 g/dL   Albumin 4.3 3.5 - 5.0 g/dL   AST 28 15 - 41 U/L   ALT 11 0 - 44 U/L  Alkaline Phosphatase 41 38 - 126 U/L   Total Bilirubin 0.9 0.3 - 1.2 mg/dL   GFR calc non Af Amer >60 >60 mL/min   GFR calc Af Amer >60 >60 mL/min   Anion gap 12 5 - 15  Urine Drug Screen, Qualitative ([facility] only)     Status: Abnormal   Collection Time: 08/06/19  4:01 PM  Result Value Ref Range   Tricyclic, Ur Screen NONE DETECTED NONE DETECTED   Amphetamines, Ur Screen NONE DETECTED NONE DETECTED   MDMA (Ecstasy)Ur Screen NONE DETECTED NONE DETECTED   Cocaine Metabolite,Ur [place] NONE DETECTED NONE DETECTED   Opiate, Ur Screen NONE DETECTED NONE DETECTED   Phencyclidine (PCP) Ur S NONE DETECTED NONE DETECTED   Cannabinoid 50 Ng, Ur [place] POSITIVE (A) NONE DETECTED   Barbiturates, Ur Screen NONE DETECTED NONE DETECTED   Benzodiazepine, Ur Scrn NONE DETECTED NONE DETECTED   Methadone Scn, Ur NONE DETECTED NONE DETECTED  Valproic acid level     Status: None   Collection Time: 08/06/19  4:01 PM  Result Value Ref Range   Valproic Acid Lvl 83 50.0 - 100.0 ug/mL  SARS Coronavirus 2 [facility] order, Performed in [facility] hospital lab) Nasopharyngeal Nasopharyngeal Swab     Status: None   Collection Time: 08/06/19  6:04 PM   Specimen: Nasopharyngeal Swab  Result Value Ref Range   SARS Coronavirus 2 NEGATIVE NEGATIVE  Urinalysis, Complete w Microscopic     Status: Abnormal   Collection Time: 08/06/19  7:27 PM  Result Value Ref Range   Color, Urine YELLOW (A) YELLOW   APPearance CLEAR (A)  CLEAR   Specific Gravity, Urine 1.020 1.005 - 1.030   pH 7.0 5.0 - 8.0   Glucose, UA NEGATIVE NEGATIVE mg/dL   Hgb urine dipstick NEGATIVE NEGATIVE   Bilirubin Urine NEGATIVE NEGATIVE   Ketones, ur 80 (A) NEGATIVE mg/dL   Protein, ur NEGATIVE NEGATIVE mg/dL   Nitrite NEGATIVE NEGATIVE   Leukocytes,Ua NEGATIVE NEGATIVE   RBC / HPF 0-5 0 - 5 RBC/hpf   WBC, UA 0-5 0 - 5 WBC/hpf   Bacteria, UA NONE SEEN NONE SEEN   Squamous Epithelial / LPF 0-5 0 - 5   Mucus PRESENT    ____________________________________________  EKG My review and personal interpretation at Time: 10:20   Indication: sz activity  Rate: 90  Rhythm: sinus Axis: normal Other: normal intervals, no stemi ____________________________________________  RADIOLOGY  ____________________________________________   PROCEDURES  Procedure(s) performed:  Procedures    Critical Care performed: no ____________________________________________   INITIAL IMPRESSION / ASSESSMENT AND PLAN / ED COURSE  Pertinent labs & imaging results that were available during my care of the patient were reviewed by me and considered in my medical decision making (see chart for details).   DDX: seizure, epilepsy, electrolyte abn, dysrhythmia, noncompliance  Ebenezer Eladio Meda is a 28 y.o. who presents to the ED with symptoms as described above.  Currently well-appearing.  Does have history of known epilepsy.  Cooperative at this time.  Will order blood work for by differential.  He is complaining of some nausea will give Phenergan.  Clinical Course as of Aug 05 2041  Thu Aug 06, 2019  1628 Patient does have a low-grade temperature mild tachycardia and leukocytosis which may be secondary to a seizure but will add on chest x-ray as well as urinalysis.   [PR]  1829 CT imaging does not show any evidence of acute intracranial abnormality does show evidence of sinus disease which  may be causing patient's fever.  Awaiting coronavirus as well as  urinalysis.  He is otherwise nontoxic-appearing.   [PR]  [id number] Patient reassessed.  States he has been having some nasal congestion therefore will add on coronavirus.  He denies any headache.  No neck stiffness.  Does not have any clinical meningismus.  Does not seem consistent with encephalitis.  Doubt infectious cause of seizures as he has a known diagnosis of epilepsy and was otherwise well-appearing today and asymptomatic.   [PR]  2016 Patient feels well and is requesting discharge home.  COVID is negative.   [PR]  2016 Has not had any true fever.  May simply be low-grade temperature after tonic-clonic seizure.  He is ambulating with a steady gait.     [PR]    Clinical Course User Index [PR] Merlyn Lot, MD    The patient was evaluated in Emergency Department today for the symptoms described in the history of present illness. He/she was evaluated in the context of the global COVID-19 pandemic, which necessitated consideration that the patient might be at risk for infection with the SARS-CoV-2 virus that causes COVID-19. Institutional protocols and algorithms that pertain to the evaluation of patients at risk for COVID-19 are in a state of rapid change based on information released by regulatory bodies including the CDC and federal and state organizations. These policies and algorithms were followed during the patient's care in the ED.  As part of my medical decision making, I reviewed the following data within the [place] notes reviewed and incorporated, Labs reviewed, notes from prior ED visits and [place] Controlled Substance Database   ____________________________________________   FINAL CLINICAL IMPRESSION(S) / ED DIAGNOSES  Final diagnoses:  Seizure-like activity ([place])      NEW MEDICATIONS STARTED DURING THIS VISIT:  New Prescriptions   No medications on file     Note:  This document was prepared using Dragon voice recognition software and may  include unintentional dictation errors.    Merlyn Lot, MD 08/06/19 2042

## 2019-08-06 NOTE — ED Notes (Addendum)
Pt sitting up asking what happened to the cab that was called for him. Explained to pt about having another seizure and needing to come back into the hospital. Verbalized understanding. Pt asked if he would be able to cooperate for a head CT. Pt agreed that he would hold still. CT tech Pam called and MD aware. ATIVAN held.

## 2019-08-07 DIAGNOSIS — G40909 Epilepsy, unspecified, not intractable, without status epilepticus: Secondary | ICD-10-CM

## 2019-08-07 DIAGNOSIS — R569 Unspecified convulsions: Secondary | ICD-10-CM

## 2019-08-07 LAB — TSH: TSH: 0.865 u[IU]/mL (ref 0.350–4.500)

## 2019-08-07 MED ORDER — ACETAMINOPHEN 650 MG RE SUPP: 650.0000 mg | Freq: Four times a day (QID) | RECTAL | Status: DC | PRN

## 2019-08-07 MED ORDER — LAMOTRIGINE 100 MG PO TABS: 200.0000 mg | ORAL_TABLET | Freq: Two times a day (BID) | ORAL | Status: DC

## 2019-08-07 MED ORDER — ACETAMINOPHEN 325 MG PO TABS
650.0000 mg | ORAL_TABLET | Freq: Four times a day (QID) | ORAL | Status: DC | PRN
  Administered 2019-08-07: 650 mg via ORAL
  Filled 2019-08-07: qty 2

## 2019-08-07 MED ORDER — RISPERIDONE 3 MG PO TABS
6.0000 mg | ORAL_TABLET | Freq: Every day | ORAL | Status: DC
  Administered 2019-08-07 – 2019-08-09 (×3): 6 mg via ORAL
  Filled 2019-08-07 (×4): qty 2

## 2019-08-07 MED ORDER — DOCUSATE SODIUM 100 MG PO CAPS
100.0000 mg | ORAL_CAPSULE | Freq: Two times a day (BID) | ORAL | Status: DC
  Administered 2019-08-07 – 2019-08-10 (×7): 100 mg via ORAL
  Filled 2019-08-07 (×7): qty 1

## 2019-08-07 MED ORDER — SODIUM CHLORIDE 0.9 % IV SOLN
INTRAVENOUS | Status: DC
  Administered 2019-08-07: 04:00:00 via INTRAVENOUS

## 2019-08-07 MED ORDER — LAMOTRIGINE 100 MG PO TABS
150.0000 mg | ORAL_TABLET | Freq: Every morning | ORAL | Status: DC
  Administered 2019-08-07 – 2019-08-08 (×2): 150 mg via ORAL
  Filled 2019-08-07 (×2): qty 2

## 2019-08-07 MED ORDER — LAMOTRIGINE 100 MG PO TABS: 100.0000 mg | ORAL_TABLET | Freq: Every morning | ORAL | Status: DC

## 2019-08-07 MED ORDER — OLANZAPINE 10 MG PO TABS
20.0000 mg | ORAL_TABLET | Freq: Every day | ORAL | Status: DC
  Administered 2019-08-07 – 2019-08-09 (×3): 20 mg via ORAL
  Filled 2019-08-07 (×4): qty 2

## 2019-08-07 MED ORDER — LAMOTRIGINE 150 MG PO TABS: 150.0000 mg | ORAL_TABLET | Freq: Every morning | ORAL | 1 refills | Status: DC

## 2019-08-07 MED ORDER — VITAMIN C 500 MG PO TABS
1000.0000 mg | ORAL_TABLET | Freq: Every day | ORAL | Status: DC
  Administered 2019-08-07 – 2019-08-10 (×4): 1000 mg via ORAL
  Filled 2019-08-07 (×4): qty 2

## 2019-08-07 MED ORDER — LAMOTRIGINE 200 MG PO TABS: 200.0000 mg | ORAL_TABLET | Freq: Every day | ORAL | 1 refills | Status: DC

## 2019-08-07 MED ORDER — LEVOTHYROXINE SODIUM 50 MCG PO TABS
50.0000 ug | ORAL_TABLET | Freq: Every day | ORAL | Status: DC
  Administered 2019-08-07 – 2019-08-10 (×4): 50 ug via ORAL
  Filled 2019-08-07 (×4): qty 1

## 2019-08-07 MED ORDER — ONDANSETRON HCL 4 MG PO TABS
4.0000 mg | ORAL_TABLET | Freq: Four times a day (QID) | ORAL | Status: DC | PRN
  Administered 2019-08-09: 21:00:00 4 mg via ORAL
  Filled 2019-08-07: qty 1

## 2019-08-07 MED ORDER — DIVALPROEX SODIUM ER 500 MG PO TB24
1000.0000 mg | ORAL_TABLET | Freq: Every day | ORAL | Status: DC
  Administered 2019-08-07 – 2019-08-09 (×3): 1000 mg via ORAL
  Filled 2019-08-07 (×4): qty 2

## 2019-08-07 MED ORDER — PROPRANOLOL HCL ER 60 MG PO CP24
60.0000 mg | ORAL_CAPSULE | Freq: Every day | ORAL | Status: DC
  Administered 2019-08-07 – 2019-08-10 (×4): 60 mg via ORAL
  Filled 2019-08-07 (×4): qty 1

## 2019-08-07 MED ORDER — OLANZAPINE 10 MG PO TABS: 10.0000 mg | ORAL_TABLET | Freq: Every day | ORAL | Status: DC

## 2019-08-07 MED ORDER — TRAZODONE HCL 50 MG PO TABS
150.0000 mg | ORAL_TABLET | Freq: Every day | ORAL | Status: DC
  Administered 2019-08-07 – 2019-08-09 (×3): 150 mg via ORAL
  Filled 2019-08-07 (×3): qty 3

## 2019-08-07 MED ORDER — ETHOSUXIMIDE 250 MG PO CAPS
250.0000 mg | ORAL_CAPSULE | Freq: Two times a day (BID) | ORAL | Status: DC
  Administered 2019-08-07 – 2019-08-10 (×7): 250 mg via ORAL
  Filled 2019-08-07 (×8): qty 1

## 2019-08-07 MED ORDER — ENOXAPARIN SODIUM 40 MG/0.4ML ~~LOC~~ SOLN
40.0000 mg | SUBCUTANEOUS | Status: DC
  Administered 2019-08-07 – 2019-08-09 (×3): 40 mg via SUBCUTANEOUS
  Filled 2019-08-07 (×3): qty 0.4

## 2019-08-07 MED ORDER — DIVALPROEX SODIUM ER 500 MG PO TB24
500.0000 mg | ORAL_TABLET | Freq: Every morning | ORAL | Status: DC
  Administered 2019-08-07 – 2019-08-10 (×4): 500 mg via ORAL
  Filled 2019-08-07 (×4): qty 1

## 2019-08-07 MED ORDER — VITAMIN D (ERGOCALCIFEROL) 1.25 MG (50000 UNIT) PO CAPS
50000.0000 [IU] | ORAL_CAPSULE | ORAL | Status: DC
  Administered 2019-08-09: 50000 [IU] via ORAL
  Filled 2019-08-07: qty 1

## 2019-08-07 MED ORDER — ONDANSETRON HCL 4 MG/2ML IJ SOLN: 4.0000 mg | Freq: Four times a day (QID) | INTRAMUSCULAR | Status: DC | PRN

## 2019-08-07 MED ORDER — LAMOTRIGINE 100 MG PO TABS: 200.0000 mg | ORAL_TABLET | Freq: Every day | ORAL | Status: DC

## 2019-08-07 MED ORDER — LAMOTRIGINE 100 MG PO TABS
200.0000 mg | ORAL_TABLET | Freq: Every day | ORAL | Status: DC
  Administered 2019-08-07: 22:00:00 200 mg via ORAL
  Filled 2019-08-07: qty 2

## 2019-08-07 MED ORDER — DIVALPROEX SODIUM ER 500 MG PO TB24: 500.0000 mg | ORAL_TABLET | ORAL | Status: DC

## 2019-08-07 MED ORDER — OLANZAPINE 10 MG PO TABS
10.0000 mg | ORAL_TABLET | Freq: Every morning | ORAL | Status: DC
  Administered 2019-08-07 – 2019-08-10 (×4): 10 mg via ORAL
  Filled 2019-08-07 (×4): qty 1

## 2019-08-07 MED ORDER — ENOXAPARIN SODIUM 40 MG/0.4ML ~~LOC~~ SOLN: 40.0000 mg | SUBCUTANEOUS | Status: DC

## 2019-08-07 MED ORDER — LORAZEPAM 1 MG PO TABS: 1.0000 mg | ORAL_TABLET | Freq: Two times a day (BID) | ORAL | Status: DC | PRN

## 2019-08-07 NOTE — H&P (Signed)
Daniel Craig is an 28 y.o. male.   Chief Complaint: Seizures HPI: The patient with past medical history of epilepsy presents to the emergency department immediately following discharge from the hospital after being found on the curb awaiting his taxi in a postictal state.  The patient seized Ativan 1 mg IV and  a 2 g dose of Dilantin in the emergency department.  Neurology was consulted via telephone who recommended observation overnight and increasing his dose of Lamictal in the morning.  The patient cannot contribute much history as he cannot remember if he got into his cab.  Once he was stabilized the emergency department staff called the hospitalist service for admission  Past Medical History:  Diagnosis Date  . Schizophrenia (HCC)   . Seizures (HCC)     Past Surgical History:  Procedure Laterality Date  . MOUTH SURGERY      Family History  Family history unknown: Yes   Social History:  reports that he has been smoking cigarettes. He has been smoking about 1.00 pack per day. He has never used smokeless tobacco. He reports current alcohol use of about 4.0 standard drinks of alcohol per week. He reports current drug use. Drug: Marijuana.  Allergies:  Allergies  Allergen Reactions  . Keppra [Levetiracetam]   . Trileptal [Oxcarbazepine]   . Vimpat [Lacosamide]     Medications Prior to Admission  Medication Sig Dispense Refill  . Ascorbic Acid (VITAMIN C) 1000 MG tablet Take 1,000 mg by mouth daily.    . divalproex (DEPAKOTE ER) 500 MG 24 hr tablet Take 500-1,000 mg by mouth See admin instructions. 500 mg every morning and 1000 mg at bedtime    . ethosuximide (ZARONTIN) 250 MG capsule Take 1 capsule (250 mg total) by mouth 2 (two) times daily. 60 capsule 2  . lamoTRIgine (LAMICTAL) 100 MG tablet Take 100-200 mg by mouth See admin instructions. 100 mg every morning and 200 mg at bedtime    . levothyroxine (SYNTHROID) 50 MCG tablet Take 50 mcg by mouth daily before breakfast.     . LORazepam (ATIVAN) 1 MG tablet Take 1 tablet (1 mg total) by mouth 2 (two) times daily as needed for seizure (or shaking). 10 tablet 0  . meloxicam (MOBIC) 15 MG tablet Take 1 tablet (15 mg total) by mouth daily as needed for pain.    Marland Kitchen OLANZapine (ZYPREXA) 10 MG tablet Take 10-20 mg by mouth daily. Take 10 mg by mouth in the morning and 20 mg by mouth at bedtime    . propranolol ER (INDERAL LA) 60 MG 24 hr capsule Take 60 mg by mouth daily.    . risperiDONE (RISPERDAL) 3 MG tablet Take 6 mg by mouth at bedtime.     . traZODone (DESYREL) 150 MG tablet Take 150 mg by mouth at bedtime.    . Vitamin D, Ergocalciferol, (DRISDOL) 50000 units CAPS capsule Take 50,000 Units by mouth every 7 (seven) days.      Results for orders placed or performed during the hospital encounter of 08/06/19 (from the past 48 hour(s))  Glucose, capillary     Status: None   Collection Time: 08/06/19 10:30 PM  Result Value Ref Range   Glucose-Capillary 94 70 - 99 mg/dL   Ct Head Wo Contrast  Result Date: 08/06/2019 CLINICAL DATA:  Recently discharged with awaiting cab ride, found having seizure EXAM: CT HEAD WITHOUT CONTRAST; CT CERVICAL SPINE WITHOUT CONTRAST TECHNIQUE: Contiguous axial images were obtained from the base of the skull  through the vertex without intravenous contrast. COMPARISON:  CT head same day FINDINGS: Brain: No evidence of acute territorial infarction, hemorrhage, hydrocephalus,extra-axial collection or mass lesion/mass effect. Normal gray-white differentiation. Ventricles are normal in size and contour. Vascular: No hyperdense vessel or unexpected calcification. Skull: The skull is intact. No fracture or focal lesion identified. Sinuses/Orbits: There is fluid seen within the right maxillary sinus and ethmoid air cells. The orbits and globes intact. Other: None Cervical spine: Alignment: There is straightening of the normal cervical lordosis. Skull base and vertebrae: Visualized skull base is intact. No  atlanto-occipital dissociation. The vertebral body heights are well maintained. Anterior osteophytes seen at C4-C5. No fracture or pathologic osseous lesion seen. Soft tissues and spinal canal: The visualized paraspinal soft tissues are unremarkable. No prevertebral soft tissue swelling is seen. The spinal canal is grossly unremarkable, no large epidural collection or significant canal narrowing. Disc levels:  No significant canal or neural foraminal narrowing. Upper chest: The lung apices are clear. Thoracic inlet is within normal limits. Other: None IMPRESSION: No acute intracranial abnormality. Right maxillary and ethmoid sinusitis No acute fracture or malalignment of the spine. Electronically Signed   By: Jonna ClarkBindu  Avutu M.D.   On: 08/06/2019 23:52   Ct Head Wo Contrast  Result Date: 08/06/2019 CLINICAL DATA:  28 year old male with seizures. No trauma. EXAM: CT HEAD WITHOUT CONTRAST TECHNIQUE: Contiguous axial images were obtained from the base of the skull through the vertex without intravenous contrast. COMPARISON:  Head CT dated 11/04/2018 FINDINGS: Brain: No evidence of acute infarction, hemorrhage, hydrocephalus, extra-axial collection or mass lesion/mass effect. Vascular: No hyperdense vessel or unexpected calcification. Skull: Normal. Negative for fracture or focal lesion. Sinuses/Orbits: There is opacification of the visualized right maxillary sinus and multiple right ethmoid air cells. The mastoid air cells are clear. Other: None IMPRESSION: 1. Normal unenhanced CT of the brain. 2. Paranasal sinus disease. Electronically Signed   By: Elgie CollardArash  Radparvar M.D.   On: 08/06/2019 18:02   Ct Cervical Spine Wo Contrast  Result Date: 08/06/2019 CLINICAL DATA:  Recently discharged with awaiting cab ride, found having seizure EXAM: CT HEAD WITHOUT CONTRAST; CT CERVICAL SPINE WITHOUT CONTRAST TECHNIQUE: Contiguous axial images were obtained from the base of the skull through the vertex without intravenous  contrast. COMPARISON:  CT head same day FINDINGS: Brain: No evidence of acute territorial infarction, hemorrhage, hydrocephalus,extra-axial collection or mass lesion/mass effect. Normal gray-white differentiation. Ventricles are normal in size and contour. Vascular: No hyperdense vessel or unexpected calcification. Skull: The skull is intact. No fracture or focal lesion identified. Sinuses/Orbits: There is fluid seen within the right maxillary sinus and ethmoid air cells. The orbits and globes intact. Other: None Cervical spine: Alignment: There is straightening of the normal cervical lordosis. Skull base and vertebrae: Visualized skull base is intact. No atlanto-occipital dissociation. The vertebral body heights are well maintained. Anterior osteophytes seen at C4-C5. No fracture or pathologic osseous lesion seen. Soft tissues and spinal canal: The visualized paraspinal soft tissues are unremarkable. No prevertebral soft tissue swelling is seen. The spinal canal is grossly unremarkable, no large epidural collection or significant canal narrowing. Disc levels:  No significant canal or neural foraminal narrowing. Upper chest: The lung apices are clear. Thoracic inlet is within normal limits. Other: None IMPRESSION: No acute intracranial abnormality. Right maxillary and ethmoid sinusitis No acute fracture or malalignment of the spine. Electronically Signed   By: Jonna ClarkBindu  Avutu M.D.   On: 08/06/2019 23:52   Dg Chest Portable 1 View  Result  Date: 08/06/2019 CLINICAL DATA:  Seizure, temperature, pneumonia EXAM: PORTABLE CHEST 1 VIEW COMPARISON:  02/06/2019 FINDINGS: The heart size and mediastinal contours are within normal limits. Both lungs are clear. The visualized skeletal structures are unremarkable. IMPRESSION: No acute abnormality of the lungs in AP portable projection. Electronically Signed   By: Eddie Candle M.D.   On: 08/06/2019 17:34    Review of Systems  Unable to perform ROS: Medical condition     Blood pressure 136/67, pulse 90, temperature 98.4 F (36.9 C), temperature source Oral, resp. rate 16, SpO2 100 %. Physical Exam  Vitals reviewed. Constitutional: He is oriented to person, place, and time. He appears well-developed and well-nourished. No distress.  HENT:  Head: Normocephalic and atraumatic.  Mouth/Throat: Oropharynx is clear and moist.  Eyes: Pupils are equal, round, and reactive to light. Conjunctivae and EOM are normal. No scleral icterus.  Neck: Normal range of motion. Neck supple. No JVD present. No tracheal deviation present. No thyromegaly present.  Cardiovascular: Normal rate, regular rhythm and normal heart sounds. Exam reveals no gallop and no friction rub.  No murmur heard. Respiratory: Effort normal and breath sounds normal. No respiratory distress. He has no wheezes.  GI: Soft. Bowel sounds are normal. He exhibits no distension. There is no abdominal tenderness.  Genitourinary:    Genitourinary Comments: Deferred   Musculoskeletal: Normal range of motion.        General: No edema.  Lymphadenopathy:    He has no cervical adenopathy.  Neurological: He is alert and oriented to person, place, and time. No cranial nerve deficit.  Skin: Skin is warm and dry. No rash noted. No erythema.  Psychiatric:  The patient is postictal and responding coherently on an inconsistent basis     Assessment/Plan This is a 28 year old male admitted for epileptic seizures. 1.  Epilepsy: Uncontrolled; increase clinical dose.  Continue ethosuximide as well as Depakote. 2.  Acute kidney injury: Etiology possibly secondary to mild rhabdo secondary to multiple seizures and just a few days.  Continue to monitor renal function.  Hydrate with intravenous fluid.  Avoid nephrotoxic agents. 3.  Overweight: BMI is 29.5; encouraged healthy diet and exercise 4.  DVT prophylaxis: Lovenox 5.  GI prophylaxis: None The patient is a full code.  Time spent on admission orders and patient care  approximately 45 minutes  Harrie Foreman, MD 08/07/2019, 4:14 AM

## 2019-08-07 NOTE — TOC Initial Note (Signed)
Transition of Care [facility](TOC) - Initial/Assessment Note    Patient Details  Name: Daniel Craig MRN: [id number] Date of Birth: [birth year]  Transition of Care [facility](TOC) CM/SW Contact:    Allayne ButcherJeanna M Shalisa Mcquade, RN Phone Number: 08/07/2019, 9:07 AM  Clinical Narrative:                 Patient placed under observation for seizure activity.  Patient has a seizure at home witnessed by friends and was brought to the emergency department by EMS, patient was going to be discharged home and was waiting for a cab when he had another seizure.   Patient has a legal guardian his father and mother, they live about an hour and a half away.  Patient lives alone in an apartment in [place] but is followed by a case worker.  Patient's father reports that when patient is ready for discharge the case worker can pick the patient up.   No discharge needs identified at this time, RNCM will call the father with updated discharge plan.   Expected Discharge Plan: Home/Self Care Barriers to Discharge: Continued Medical Work up   Patient Goals and CMS Choice        Expected Discharge Plan and Services Expected Discharge Plan: Home/Self Care       Living arrangements for the past 2 months: Apartment                                      Prior Living Arrangements/Services Living arrangements for the past 2 months: Apartment Lives with:: Self Patient language and need for interpreter reviewed:: No Do you feel safe going back to the place where you live?: Yes      Need for Family Participation in Patient Care: Yes (Comment)(mental health history) Care giver support system in place?: Yes (comment)(father and mother)   Criminal Activity/Legal Involvement Pertinent to Current Situation/Hospitalization: No - Comment as needed  Activities of Daily Living Home Assistive Devices/Equipment: None ADL Screening (condition at time of admission) Patient's cognitive ability adequate to safely complete daily  activities?: Yes Is the patient deaf or have difficulty hearing?: No Does the patient have difficulty seeing, even when wearing glasses/contacts?: No Does the patient have difficulty concentrating, remembering, or making decisions?: No Patient able to express need for assistance with ADLs?: Yes Does the patient have difficulty dressing or bathing?: No Independently performs ADLs?: Yes (appropriate for developmental age) Does the patient have difficulty walking or climbing stairs?: No Weakness of Legs: None Weakness of Arms/Hands: None  Permission Sought/Granted Permission sought to share information with : Case Manager, Guardian Permission granted to share information with : Yes, Verbal Permission Granted        Permission granted to share info w Relationship: Guardian is Father Smiley Housemanonnie Halls     Emotional Assessment         Alcohol / Substance Use: Not Applicable Psych Involvement: Outpatient Provider  Admission diagnosis:  Seizure [facility](HCC) [R56.9] Patient Active Problem List   Diagnosis Date Noted  . Epilepsy (HCC) 08/07/2019  . Extremely severe auditory hallucinations 06/02/2019  . Hypoglycemia 11/09/2018  . Schizophrenia, paranoid (HCC) 02/27/2018  . Cannabis use disorder, moderate, dependence (HCC) 02/27/2018  . Tobacco use disorder 02/27/2018  . 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 Altered mental status 12/28/2017  . Fever 08/11/2017  . Acute URI 08/11/2017  . Delirium 06/25/2017  . Schizophrenia (HCC) 06/25/2017  . Seizures (HCC) 06/25/2017  PCP:  [place]:   [place], [place] - [place] [address] Phone: [phone] Fax: [phone]  [place], [place] - [place] [address] Phone: [phone] Fax: [phone]  [place] [address] [place] [address] Phone:  [phone] Fax: [phone]     Social Determinants of Health (SDOH) Interventions    Readmission Risk Interventions No flowsheet data found.

## 2019-08-07 NOTE — ED Notes (Signed)
Lab at the bedside 

## 2019-08-07 NOTE — Plan of Care (Signed)
Pt is alert but not responding to questions.  Bed is wet - don't know if he urinated or spilled the urinal.  Not being cooperative as we change sheets.

## 2019-08-07 NOTE — Discharge Instructions (Signed)
Smoking cessation  

## 2019-08-07 NOTE — Plan of Care (Signed)
Pt is being d/ced to home.  He lives in his own apt but does have a mental health caseworker who makes sure that he is taking his medication.  Admitted for seizures.  His lamictal dose was increased.  Father is picking him up and will review d/c instructions with both of them. IV removed and tele d/ced.

## 2019-08-07 NOTE — TOC Progression Note (Signed)
Transition of Care [facility]) - Progression Note    Patient Details  Name: Daniel Craig MRN: [id number] Date of Birth: 07/28/1991  Transition of Care [facility]) CM/SW Contact  Shelbie Hutching, RN Phone Number: 08/07/2019, 3:53 PM  Clinical Narrative:     Discharge canceled patient was unsteady on his feet, patient requires further monitoring, his dilantin levels may be elevated.   Expected Discharge Plan: Home/Self Care Barriers to Discharge: Barriers Resolved  Expected Discharge Plan and Services Expected Discharge Plan: Home/Self Care       Living arrangements for the past 2 months: Apartment Expected Discharge Date: 08/07/19                                     Social Determinants of Health (SDOH) Interventions    Readmission Risk Interventions No flowsheet data found.

## 2019-08-07 NOTE — Discharge Summary (Signed)
[place] at [place] NAME: Daniel Craig    MR#:  [id number]  DATE OF BIRTH:  Feb 02, 1991  DATE OF ADMISSION:  08/06/2019   ADMITTING PHYSICIAN: Harrie Foreman, MD  DATE OF DISCHARGE: 08/07/2019  PRIMARY CARE PHYSICIAN: [facility]   ADMISSION DIAGNOSIS:  Seizure ([place]) [R56.9] DISCHARGE DIAGNOSIS:  Active Problems:   Epilepsy ([place])  SECONDARY DIAGNOSIS:   Past Medical History:  Diagnosis Date   Schizophrenia ([place])    Seizures ([place])    HOSPITAL COURSE:  This is a 28 year old male admitted for epileptic seizures. 1.  Epilepsy:   Continue ethosuximide as well as Depakote.   Per Dr. Doy Mince, increase lamoTRIgine to 150 mg in a.m. and keep 200 mg in p.m.  2.  Acute kidney injury: Etiology possibly secondary to mild rhabdo secondary to multiple seizures and just a few days.  Continue to monitor renal function.  Hydrated with intravenous fluid.  Avoid nephrotoxic agents.  Follow-up PCP. 3.  Overweight: BMI is 29.5; encouraged healthy diet and exercise Tobacco abuse.  Smoking cessation was counseled for 3 to 4 minutes. Marijuana abuse.  Counseled. I discussed with Dr. Doy Mince. DISCHARGE CONDITIONS:   CONSULTS OBTAINED:  Treatment Team:  Catarina Hartshorn, MD Alexis Goodell, MD DRUG ALLERGIES:   Allergies  Allergen Reactions   Keppra [Levetiracetam]    Trileptal [Oxcarbazepine]    Vimpat [Lacosamide]    DISCHARGE MEDICATIONS:   Allergies as of 08/07/2019      Reactions   Keppra [levetiracetam]    Trileptal [oxcarbazepine]    Vimpat [lacosamide]       Medication List    TAKE these medications   divalproex 500 MG 24 hr tablet Commonly known as: DEPAKOTE ER Take 500-1,000 mg by mouth See admin instructions. 500 mg every morning and 1000 mg at bedtime   ethosuximide 250 MG capsule Commonly known as: Zarontin Take 1 capsule (250 mg total) by mouth 2 (two) times daily.   lamoTRIgine 200 MG  tablet Commonly known as: LAMICTAL Take 1 tablet (200 mg total) by mouth at bedtime. What changed: You were already taking a medication with the same name, and this prescription was added. Make sure you understand how and when to take each.   lamoTRIgine 150 MG tablet Commonly known as: LAMICTAL Take 1 tablet (150 mg total) by mouth every morning. Start taking on: August 08, 2019 What changed:   medication strength  how much to take  when to take this  additional instructions   levothyroxine 50 MCG tablet Commonly known as: SYNTHROID Take 50 mcg by mouth daily before breakfast.   LORazepam 1 MG tablet Commonly known as: ATIVAN Take 1 tablet (1 mg total) by mouth 2 (two) times daily as needed for seizure (or shaking).   meloxicam 15 MG tablet Commonly known as: MOBIC Take 1 tablet (15 mg total) by mouth daily as needed for pain.   OLANZapine 10 MG tablet Commonly known as: ZYPREXA Take 10-20 mg by mouth daily. Take 10 mg by mouth in the morning and 20 mg by mouth at bedtime   propranolol ER 60 MG 24 hr capsule Commonly known as: INDERAL LA Take 60 mg by mouth daily.   risperiDONE 3 MG tablet Commonly known as: RISPERDAL Take 6 mg by mouth at bedtime.   traZODone 150 MG tablet Commonly known as: DESYREL Take 150 mg by mouth at bedtime.   vitamin C 1000 MG tablet Take 1,000 mg by mouth daily.  Vitamin D (Ergocalciferol) 1.25 MG (50000 UT) Caps capsule Commonly known as: DRISDOL Take 50,000 Units by mouth every 7 (seven) days.        DISCHARGE INSTRUCTIONS:  See AVS.  If you experience worsening of your admission symptoms, develop shortness of breath, life threatening emergency, suicidal or homicidal thoughts you must seek medical attention immediately by calling 911 or calling your MD immediately  if symptoms less severe.  You Must read complete instructions/literature along with all the possible adverse reactions/side effects for all the Medicines you  take and that have been prescribed to you. Take any new Medicines after you have completely understood and accpet all the possible adverse reactions/side effects.   Please note  You were cared for by a hospitalist during your hospital stay. If you have any questions about your discharge medications or the care you received while you were in the hospital after you are discharged, you can call the unit and asked to speak with the hospitalist on call if the hospitalist that took care of you is not available. Once you are discharged, your primary care physician will handle any further medical issues. Please note that NO REFILLS for any discharge medications will be authorized once you are discharged, as it is imperative that you return to your primary care physician (or establish a relationship with a primary care physician if you do not have one) for your aftercare needs so that they can reassess your need for medications and monitor your lab values.    On the day of Discharge:  VITAL SIGNS:  Blood pressure 127/68, pulse 91, temperature 98.4 F (36.9 C), temperature source Oral, resp. rate 16, height 6' 0.01" (1.829 m), weight 98.9 kg, SpO2 100 %. PHYSICAL EXAMINATION:  GENERAL:  28 y.o.-year-old patient lying in the bed with no acute distress.  EYES: Pupils equal, round, reactive to light and accommodation. No scleral icterus. Extraocular muscles intact.  HEENT: Head atraumatic, normocephalic. Oropharynx and nasopharynx clear.  NECK:  Supple, no jugular venous distention. No thyroid enlargement, no tenderness.  LUNGS: Normal breath sounds bilaterally, no wheezing, rales,rhonchi or crepitation. No use of accessory muscles of respiration.  CARDIOVASCULAR: S1, S2 normal. No murmurs, rubs, or gallops.  ABDOMEN: Soft, non-tender, non-distended. Bowel sounds present. No organomegaly or mass.  EXTREMITIES: No pedal edema, cyanosis, or clubbing.  NEUROLOGIC: Cranial nerves II through XII are intact.  Muscle strength 5/5 in all extremities. Sensation intact. Gait not checked.  PSYCHIATRIC: The patient is alert and oriented x 3.  SKIN: No obvious rash, lesion, or ulcer.  DATA REVIEW:   CBC Recent Labs  Lab 08/06/19 1601  WBC 17.4*  HGB 14.4  HCT 41.8  PLT 187    Chemistries  Recent Labs  Lab 08/06/19 1601  NA 133*  K 4.0  CL 99  CO2 22  GLUCOSE 125*  BUN 11  CREATININE 1.27*  CALCIUM 9.6  AST 28  ALT 11  ALKPHOS 41  BILITOT 0.9     Microbiology Results  Results for orders placed or performed during the hospital encounter of 08/06/19  SARS Coronavirus 2 [facility] order, Performed in [facility] hospital lab) Nasopharyngeal Nasopharyngeal Swab     Status: None   Collection Time: 08/06/19  6:04 PM   Specimen: Nasopharyngeal Swab  Result Value Ref Range Status   SARS Coronavirus 2 NEGATIVE NEGATIVE Final    Comment: (NOTE) If result is NEGATIVE SARS-CoV-2 target nucleic acids are NOT DETECTED. The SARS-CoV-2 RNA is generally detectable in upper  and lower  respiratory specimens during the acute phase of infection. The lowest  concentration of SARS-CoV-2 viral copies this assay can detect is 250  copies / mL. A negative result does not preclude SARS-CoV-2 infection  and should not be used as the sole basis for treatment or other  patient management decisions.  A negative result may occur with  improper specimen collection / handling, submission of specimen other  than nasopharyngeal swab, presence of viral mutation(s) within the  areas targeted by this assay, and inadequate number of viral copies  (<250 copies / mL). A negative result must be combined with clinical  observations, patient history, and epidemiological information. If result is POSITIVE SARS-CoV-2 target nucleic acids are DETECTED. The SARS-CoV-2 RNA is generally detectable in upper and lower  respiratory specimens dur ing the acute phase of infection.  Positive  results are indicative of active  infection with SARS-CoV-2.  Clinical  correlation with patient history and other diagnostic information is  necessary to determine patient infection status.  Positive results do  not rule out bacterial infection or co-infection with other viruses. If result is PRESUMPTIVE POSTIVE SARS-CoV-2 nucleic acids MAY BE PRESENT.   A presumptive positive result was obtained on the submitted specimen  and confirmed on repeat testing.  While 2019 novel coronavirus  (SARS-CoV-2) nucleic acids may be present in the submitted sample  additional confirmatory testing may be necessary for epidemiological  and / or clinical management purposes  to differentiate between  SARS-CoV-2 and other Sarbecovirus currently known to infect humans.  If clinically indicated additional testing with an alternate test  methodology [phone]([id number]) is advised. The SARS-CoV-2 RNA is generally  detectable in upper and lower respiratory sp ecimens during the acute  phase of infection. The expected result is Negative. Fact Sheet for Patients:  BoilerBrush.com.cyhttps://www.fda.gov/media/136312/download Fact Sheet for Healthcare Providers: https://pope.com/https://www.fda.gov/media/136313/download This test is not yet approved or cleared by the [place] FDA and has been authorized for detection and/or diagnosis of SARS-CoV-2 by FDA under an Emergency Use Authorization (EUA).  This EUA will remain in effect (meaning this test can be used) for the duration of the COVID-19 declaration under Section 564(b)(1) of the Act, 21 U.S.C. section 360bbb-3(b)(1), unless the authorization is terminated or revoked sooner. Performed at [facility], [address] [place], [place] [address]     RADIOLOGY:  Ct Head Wo Contrast  Result Date: 08/06/2019 CLINICAL DATA:  Recently discharged with awaiting cab ride, found having seizure EXAM: CT HEAD WITHOUT CONTRAST; CT CERVICAL SPINE WITHOUT CONTRAST TECHNIQUE: Contiguous axial images were obtained from the base of  the skull through the vertex without intravenous contrast. COMPARISON:  CT head same day FINDINGS: Brain: No evidence of acute territorial infarction, hemorrhage, hydrocephalus,extra-axial collection or mass lesion/mass effect. Normal gray-white differentiation. Ventricles are normal in size and contour. Vascular: No hyperdense vessel or unexpected calcification. Skull: The skull is intact. No fracture or focal lesion identified. Sinuses/Orbits: There is fluid seen within the right maxillary sinus and ethmoid air cells. The orbits and globes intact. Other: None Cervical spine: Alignment: There is straightening of the normal cervical lordosis. Skull base and vertebrae: Visualized skull base is intact. No atlanto-occipital dissociation. The vertebral body heights are well maintained. Anterior osteophytes seen at C4-C5. No fracture or pathologic osseous lesion seen. Soft tissues and spinal canal: The visualized paraspinal soft tissues are unremarkable. No prevertebral soft tissue swelling is seen. The spinal canal is grossly unremarkable, no large epidural collection or significant canal narrowing. Disc  levels:  No significant canal or neural foraminal narrowing. Upper chest: The lung apices are clear. Thoracic inlet is within normal limits. Other: None IMPRESSION: No acute intracranial abnormality. Right maxillary and ethmoid sinusitis No acute fracture or malalignment of the spine. Electronically Signed   By: Jonna ClarkBindu  Avutu M.D.   On: 08/06/2019 23:52   Ct Head Wo Contrast  Result Date: 08/06/2019 CLINICAL DATA:  28 year old male with seizures. No trauma. EXAM: CT HEAD WITHOUT CONTRAST TECHNIQUE: Contiguous axial images were obtained from the base of the skull through the vertex without intravenous contrast. COMPARISON:  Head CT dated 11/04/2018 FINDINGS: Brain: No evidence of acute infarction, hemorrhage, hydrocephalus, extra-axial collection or mass lesion/mass effect. Vascular: No hyperdense vessel or unexpected  calcification. Skull: Normal. Negative for fracture or focal lesion. Sinuses/Orbits: There is opacification of the visualized right maxillary sinus and multiple right ethmoid air cells. The mastoid air cells are clear. Other: None IMPRESSION: 1. Normal unenhanced CT of the brain. 2. Paranasal sinus disease. Electronically Signed   By: Elgie CollardArash  Radparvar M.D.   On: 08/06/2019 18:02   Ct Cervical Spine Wo Contrast  Result Date: 08/06/2019 CLINICAL DATA:  Recently discharged with awaiting cab ride, found having seizure EXAM: CT HEAD WITHOUT CONTRAST; CT CERVICAL SPINE WITHOUT CONTRAST TECHNIQUE: Contiguous axial images were obtained from the base of the skull through the vertex without intravenous contrast. COMPARISON:  CT head same day FINDINGS: Brain: No evidence of acute territorial infarction, hemorrhage, hydrocephalus,extra-axial collection or mass lesion/mass effect. Normal gray-white differentiation. Ventricles are normal in size and contour. Vascular: No hyperdense vessel or unexpected calcification. Skull: The skull is intact. No fracture or focal lesion identified. Sinuses/Orbits: There is fluid seen within the right maxillary sinus and ethmoid air cells. The orbits and globes intact. Other: None Cervical spine: Alignment: There is straightening of the normal cervical lordosis. Skull base and vertebrae: Visualized skull base is intact. No atlanto-occipital dissociation. The vertebral body heights are well maintained. Anterior osteophytes seen at C4-C5. No fracture or pathologic osseous lesion seen. Soft tissues and spinal canal: The visualized paraspinal soft tissues are unremarkable. No prevertebral soft tissue swelling is seen. The spinal canal is grossly unremarkable, no large epidural collection or significant canal narrowing. Disc levels:  No significant canal or neural foraminal narrowing. Upper chest: The lung apices are clear. Thoracic inlet is within normal limits. Other: None IMPRESSION: No acute  intracranial abnormality. Right maxillary and ethmoid sinusitis No acute fracture or malalignment of the spine. Electronically Signed   By: Jonna ClarkBindu  Avutu M.D.   On: 08/06/2019 23:52   Dg Chest Portable 1 View  Result Date: 08/06/2019 CLINICAL DATA:  Seizure, temperature, pneumonia EXAM: PORTABLE CHEST 1 VIEW COMPARISON:  02/06/2019 FINDINGS: The heart size and mediastinal contours are within normal limits. Both lungs are clear. The visualized skeletal structures are unremarkable. IMPRESSION: No acute abnormality of the lungs in AP portable projection. Electronically Signed   By: Lauralyn PrimesAlex  Bibbey M.D.   On: 08/06/2019 17:34     Management plans discussed with the patient, family and they are in agreement.  CODE STATUS: Full Code   TOTAL TIME TAKING CARE OF THIS PATIENT: 27 minutes.    Shaune PollackQing Moshe Wenger M.D on 08/07/2019 at 10:18 AM  Between 7am to 6pm - Pager - [phone]  After 6pm go to [email or web address] - Social research officer, governmentpassword [email or web address] [facility]  [employer] [place] [place] [facility]  Office  [phone]  CC: Primary care physician; [facility]   Note: This dictation was prepared with Dragon dictation along  with smaller phrase technology. Any transcriptional errors that result from this process are unintentional.

## 2019-08-07 NOTE — Progress Notes (Addendum)
0404 Message sent to pharmacy for missing dose of Phenytoin 2000mg  IVPB, called pharmacy to follow up about dose at 0500, advised writer that medication was sent to ER call and have them send it up to writer.  Writer called Seth Bake B to request medication . Seth Bake advised she was in a patient room & had not seen it but would double check and give Probation officer a call back. Awaiting return call at this time. 4982 Phenytoin 2000 mg IVPB  Arrived from ER.

## 2019-08-07 NOTE — Plan of Care (Signed)
D/c was cancelled.  When I tried to ambulate pt, he was weaving all over the room.  The walker didn't even keep him steady.  Father, his legal guardian, witnessed his dizziness.  Dr. Doy Mince is concerned about his dilantin levels.  Labs are to be drawn and PT is to work with him.  Pt not happy about this, but his father helped convince him.  Will need a new IV put in.

## 2019-08-07 NOTE — TOC Transition Note (Signed)
Transition of Care [facility]) - CM/SW Discharge Note   Patient Details  Name: Daniel Craig MRN: [id number] Date of Birth: 04-Apr-1991  [facility]) CM/SW Contact:  Shelbie Hutching, RN Phone Number: 08/07/2019, 11:48 AM   Clinical Narrative:     Patient has been cleared by neurology.  Patient's father updated and he is on his way now to pick up the patient.    Final next level of care: Home/Self Care Barriers to Discharge: Barriers Resolved   Patient Goals and CMS Choice        Discharge Placement                       Discharge Plan and Services                                     Social Determinants of Health (SDOH) Interventions     Readmission Risk Interventions No flowsheet data found.

## 2019-08-07 NOTE — Consult Note (Signed)
Reason for Consult:Seizure Referring Physician: Imogene Burnhen  CC: Seizure  HPI: Daniel Craig is an 28 y.o. male with a history of seizures who reports a frequency of about one breakthrough seizure a month.  [facility].  Patient presented to the ED on yesterday after a breakthrough seizure at home.  Was discharged but had to return after being found on the curb in a postictal state.  Patient loaded with Dilantin and given Ativan.  Has been stable overnight.    Past Medical History:  Diagnosis Date  . Schizophrenia (HCC)   . Seizures (HCC)     Past Surgical History:  Procedure Laterality Date  . MOUTH SURGERY      Family History  Family history unknown: Yes    Social History:  reports that he has been smoking cigarettes. He has been smoking about 1.00 pack per day. He has never used smokeless tobacco. He reports current alcohol use of about 4.0 standard drinks of alcohol per week. He reports current drug use. Drug: Marijuana.  Allergies  Allergen Reactions  . Keppra [Levetiracetam]   . Trileptal [Oxcarbazepine]   . Vimpat [Lacosamide]     Medications:  I have reviewed the patient's current medications. Prior to Admission:  Medications Prior to Admission  Medication Sig Dispense Refill Last Dose  . Ascorbic Acid (VITAMIN C) 1000 MG tablet Take 1,000 mg by mouth daily.   Unknown at Unknown  . divalproex (DEPAKOTE ER) 500 MG 24 hr tablet Take 500-1,000 mg by mouth See admin instructions. 500 mg every morning and 1000 mg at bedtime   Unknown at Unknown  . ethosuximide (ZARONTIN) 250 MG capsule Take 1 capsule (250 mg total) by mouth 2 (two) times daily. 60 capsule 2 Unknown at Unknown  . lamoTRIgine (LAMICTAL) 100 MG tablet Take 100-200 mg by mouth See admin instructions. 100 mg every morning and 200 mg at bedtime   Unknown at Unknown  . levothyroxine (SYNTHROID) 50 MCG tablet Take 50 mcg by mouth daily before breakfast.   Unknown at Unknown  . LORazepam  (ATIVAN) 1 MG tablet Take 1 tablet (1 mg total) by mouth 2 (two) times daily as needed for seizure (or shaking). 10 tablet 0 prn at prn  . meloxicam (MOBIC) 15 MG tablet Take 1 tablet (15 mg total) by mouth daily as needed for pain.   prn at prn  . OLANZapine (ZYPREXA) 10 MG tablet Take 10-20 mg by mouth daily. Take 10 mg by mouth in the morning and 20 mg by mouth at bedtime   Unknown at Unknown  . propranolol ER (INDERAL LA) 60 MG 24 hr capsule Take 60 mg by mouth daily.   Unknown at Unknown  . risperiDONE (RISPERDAL) 3 MG tablet Take 6 mg by mouth at bedtime.    Unknown at Unknown  . traZODone (DESYREL) 150 MG tablet Take 150 mg by mouth at bedtime.   Unknown at Unknown  . Vitamin D, Ergocalciferol, (DRISDOL) 50000 units CAPS capsule Take 50,000 Units by mouth every 7 (seven) days.   Unknown at Unknown   Scheduled: . divalproex  500 mg Oral q morning - 10a   And  . divalproex  1,000 mg Oral QHS  . docusate sodium  100 mg Oral BID  . enoxaparin (LOVENOX) injection  40 mg Subcutaneous Q24H  . ethosuximide  250 mg Oral BID  . lamoTRIgine  150 mg Oral q morning - 10a   And  . lamoTRIgine  200 mg Oral QHS  .  levothyroxine  50 mcg Oral QAC breakfast  . OLANZapine  10 mg Oral q morning - 10a   And  . OLANZapine  20 mg Oral QHS  . propranolol ER  60 mg Oral Daily  . risperiDONE  6 mg Oral QHS  . traZODone  150 mg Oral QHS  . vitamin C  1,000 mg Oral Daily  . [START ON 08/09/2019] Vitamin D (Ergocalciferol)  50,000 Units Oral Q7 days    ROS: History obtained from the patient  General ROS: negative for - chills, fatigue, fever, night sweats, weight gain or weight loss Psychological ROS: negative for - behavioral disorder, hallucinations, memory difficulties, mood swings or suicidal ideation Ophthalmic ROS: negative for - blurry vision, double vision, eye pain or loss of vision ENT ROS: negative for - epistaxis, nasal discharge, oral lesions, sore throat, tinnitus or vertigo Allergy and  Immunology ROS: negative for - hives or itchy/watery eyes Hematological and Lymphatic ROS: negative for - bleeding problems, bruising or swollen lymph nodes Endocrine ROS: negative for - galactorrhea, hair pattern changes, polydipsia/polyuria or temperature intolerance Respiratory ROS: negative for - cough, hemoptysis, shortness of breath or wheezing Cardiovascular ROS: negative for - chest pain, dyspnea on exertion, edema or irregular heartbeat Gastrointestinal ROS: negative for - abdominal pain, diarrhea, hematemesis, nausea/vomiting or stool incontinence Genito-Urinary ROS: negative for - dysuria, hematuria, incontinence or urinary frequency/urgency Musculoskeletal ROS: negative for - joint swelling or muscular weakness Neurological ROS: as noted in HPI Dermatological ROS: negative for rash and skin lesion changes  Physical Examination: Blood pressure 127/68, pulse 91, temperature 98.4 F (36.9 C), temperature source Oral, resp. rate 16, height 6' 0.01" (1.829 m), weight 98.9 kg, SpO2 100 %.  HEENT-  Normocephalic, no lesions, without obvious abnormality.  Normal external eye and conjunctiva.  Normal TM's bilaterally.  Normal auditory canals and external ears. Normal external nose, mucus membranes and septum.  Normal pharynx. Cardiovascular- S1, S2 normal, pulses palpable throughout   Lungs- chest clear, no wheezing, rales, normal symmetric air entry Abdomen- soft, non-tender; bowel sounds normal; no masses,  no organomegaly Extremities- no edema Lymph-no adenopathy palpable Musculoskeletal-no joint tenderness, deformity or swelling Skin-warm and dry, no hyperpigmentation, vitiligo, or suspicious lesions  Neurological Examination   Mental Status: Alert, oriented, thought content appropriate.  Speech fluent without evidence of aphasia.  Able to follow 3 step commands without difficulty. Cranial Nerves: II: Discs flat bilaterally; Visual fields grossly normal, pupils equal, round,  reactive to light and accommodation III,IV, VI: ptosis not present, extra-ocular motions intact bilaterally.  Nystagmus noted. V,VII: smile symmetric, facial light touch sensation normal bilaterally VIII: hearing normal bilaterally IX,X: gag reflex present XI: bilateral shoulder shrug XII: midline tongue extension Motor: Right : Upper extremity   5/5    Left:     Upper extremity   5/5  Lower extremity   5/5     Lower extremity   5/5 Tone and bulk:normal tone throughout; no atrophy noted Sensory: Pinprick and light touch intact throughout, bilaterally Deep Tendon Reflexes: Symmetric throughout Plantars: Right: downgoing   Left: downgoing Cerebellar: Normal finger-to-nose and normal heel-to-shin testing bilaterally Gait: not tested due to safety concerns    Laboratory Studies:   Basic Metabolic Panel: Recent Labs  Lab 08/06/19 1601  NA 133*  K 4.0  CL 99  CO2 22  GLUCOSE 125*  BUN 11  CREATININE 1.27*  CALCIUM 9.6    Liver Function Tests: Recent Labs  Lab 08/06/19 1601  AST 28  ALT 11  ALKPHOS 41  BILITOT 0.9  PROT 7.8  ALBUMIN 4.3   No results for input(s): LIPASE, AMYLASE in the last 168 hours. No results for input(s): AMMONIA in the last 168 hours.  CBC: Recent Labs  Lab 08/06/19 1601  WBC 17.4*  NEUTROABS 12.9*  HGB 14.4  HCT 41.8  MCV 88.7  PLT 187    Cardiac Enzymes: No results for input(s): CKTOTAL, CKMB, CKMBINDEX, TROPONINI in the last 168 hours.  BNP: Invalid input(s): POCBNP  CBG: Recent Labs  Lab 08/06/19 2230  GLUCAP 94    Microbiology: Results for orders placed or performed during the hospital encounter of 08/06/19  SARS Coronavirus 2 [facility](Hospital order, Performed in [facility] hospital lab) Nasopharyngeal Nasopharyngeal Swab     Status: None   Collection Time: 08/06/19  6:04 PM   Specimen: Nasopharyngeal Swab  Result Value Ref Range Status   SARS Coronavirus 2 NEGATIVE NEGATIVE Final    Comment: (NOTE) If result is  NEGATIVE SARS-CoV-2 target nucleic acids are NOT DETECTED. The SARS-CoV-2 RNA is generally detectable in upper and lower  respiratory specimens during the acute phase of infection. The lowest  concentration of SARS-CoV-2 viral copies this assay can detect is 250  copies / mL. A negative result does not preclude SARS-CoV-2 infection  and should not be used as the sole basis for treatment or other  patient management decisions.  A negative result may occur with  improper specimen collection / handling, submission of specimen other  than nasopharyngeal swab, presence of viral mutation(s) within the  areas targeted by this assay, and inadequate number of viral copies  (<250 copies / mL). A negative result must be combined with clinical  observations, patient history, and epidemiological information. If result is POSITIVE SARS-CoV-2 target nucleic acids are DETECTED. The SARS-CoV-2 RNA is generally detectable in upper and lower  respiratory specimens dur ing the acute phase of infection.  Positive  results are indicative of active infection with SARS-CoV-2.  Clinical  correlation with patient history and other diagnostic information is  necessary to determine patient infection status.  Positive results do  not rule out bacterial infection or co-infection with other viruses. If result is PRESUMPTIVE POSTIVE SARS-CoV-2 nucleic acids MAY BE PRESENT.   A presumptive positive result was obtained on the submitted specimen  and confirmed on repeat testing.  While 2019 novel coronavirus  (SARS-CoV-2) nucleic acids may be present in the submitted sample  additional confirmatory testing may be necessary for epidemiological  and / or clinical management purposes  to differentiate between  SARS-CoV-2 and other Sarbecovirus currently known to infect humans.  If clinically indicated additional testing with an alternate test  methodology [phone] is advised. The SARS-CoV-2 RNA is generally  detectable  in upper and lower respiratory sp ecimens during the acute  phase of infection. The expected result is Negative. Fact Sheet for Patients:  BoilerBrush.com.cyhttps://www.fda.gov/media/136312/download Fact Sheet for Healthcare Providers: https://pope.com/https://www.fda.gov/media/136313/download This test is not yet approved or cleared by the [place] FDA and has been authorized for detection and/or diagnosis of SARS-CoV-2 by FDA under an Emergency Use Authorization (EUA).  This EUA will remain in effect (meaning this test can be used) for the duration of the COVID-19 declaration under Section 564(b)(1) of the Act, 21 U.S.C. section 360bbb-3(b)(1), unless the authorization is terminated or revoked sooner. Performed at [facility], [address] [place], [place] [address]     Coagulation Studies: No results for input(s): LABPROT, INR in the last 72 hours.  Urinalysis:  Recent Labs  Lab 08/06/19 1927  COLORURINE YELLOW*  LABSPEC 1.020  PHURINE 7.0  GLUCOSEU NEGATIVE  HGBUR NEGATIVE  BILIRUBINUR NEGATIVE  KETONESUR 80*  PROTEINUR NEGATIVE  NITRITE NEGATIVE  LEUKOCYTESUR NEGATIVE    Lipid Panel:     Component Value Date/Time   CHOL 128 02/28/2018 0825   TRIG 83 02/28/2018 0825   HDL 47 02/28/2018 0825   CHOLHDL 2.7 02/28/2018 0825   VLDL 17 02/28/2018 0825   LDLCALC 64 02/28/2018 0825    HgbA1C:  Lab Results  Component Value Date   HGBA1C 5.0 02/28/2018    Urine Drug Screen:      Component Value Date/Time   LABOPIA NONE DETECTED 08/06/2019 1601   COCAINSCRNUR NONE DETECTED 08/06/2019 1601   LABBENZ NONE DETECTED 08/06/2019 1601   AMPHETMU NONE DETECTED 08/06/2019 1601   THCU POSITIVE (A) 08/06/2019 1601   LABBARB NONE DETECTED 08/06/2019 1601    Alcohol Level: No results for input(s): ETH in the last 168 hours.   Imaging: Ct Head Wo Contrast  Result Date: 08/06/2019 CLINICAL DATA:  Recently discharged with awaiting cab ride, found having seizure EXAM: CT HEAD WITHOUT  CONTRAST; CT CERVICAL SPINE WITHOUT CONTRAST TECHNIQUE: Contiguous axial images were obtained from the base of the skull through the vertex without intravenous contrast. COMPARISON:  CT head same day FINDINGS: Brain: No evidence of acute territorial infarction, hemorrhage, hydrocephalus,extra-axial collection or mass lesion/mass effect. Normal gray-white differentiation. Ventricles are normal in size and contour. Vascular: No hyperdense vessel or unexpected calcification. Skull: The skull is intact. No fracture or focal lesion identified. Sinuses/Orbits: There is fluid seen within the right maxillary sinus and ethmoid air cells. The orbits and globes intact. Other: None Cervical spine: Alignment: There is straightening of the normal cervical lordosis. Skull base and vertebrae: Visualized skull base is intact. No atlanto-occipital dissociation. The vertebral body heights are well maintained. Anterior osteophytes seen at C4-C5. No fracture or pathologic osseous lesion seen. Soft tissues and spinal canal: The visualized paraspinal soft tissues are unremarkable. No prevertebral soft tissue swelling is seen. The spinal canal is grossly unremarkable, no large epidural collection or significant canal narrowing. Disc levels:  No significant canal or neural foraminal narrowing. Upper chest: The lung apices are clear. Thoracic inlet is within normal limits. Other: None IMPRESSION: No acute intracranial abnormality. Right maxillary and ethmoid sinusitis No acute fracture or malalignment of the spine. Electronically Signed   By: Prudencio Pair M.D.   On: 08/06/2019 23:52   Ct Head Wo Contrast  Result Date: 08/06/2019 CLINICAL DATA:  28 year old male with seizures. No trauma. EXAM: CT HEAD WITHOUT CONTRAST TECHNIQUE: Contiguous axial images were obtained from the base of the skull through the vertex without intravenous contrast. COMPARISON:  Head CT dated 11/04/2018 FINDINGS: Brain: No evidence of acute infarction, hemorrhage,  hydrocephalus, extra-axial collection or mass lesion/mass effect. Vascular: No hyperdense vessel or unexpected calcification. Skull: Normal. Negative for fracture or focal lesion. Sinuses/Orbits: There is opacification of the visualized right maxillary sinus and multiple right ethmoid air cells. The mastoid air cells are clear. Other: None IMPRESSION: 1. Normal unenhanced CT of the brain. 2. Paranasal sinus disease. Electronically Signed   By: Anner Crete M.D.   On: 08/06/2019 18:02   Ct Cervical Spine Wo Contrast  Result Date: 08/06/2019 CLINICAL DATA:  Recently discharged with awaiting cab ride, found having seizure EXAM: CT HEAD WITHOUT CONTRAST; CT CERVICAL SPINE WITHOUT CONTRAST TECHNIQUE: Contiguous axial images were obtained from the base of the  skull through the vertex without intravenous contrast. COMPARISON:  CT head same day FINDINGS: Brain: No evidence of acute territorial infarction, hemorrhage, hydrocephalus,extra-axial collection or mass lesion/mass effect. Normal gray-white differentiation. Ventricles are normal in size and contour. Vascular: No hyperdense vessel or unexpected calcification. Skull: The skull is intact. No fracture or focal lesion identified. Sinuses/Orbits: There is fluid seen within the right maxillary sinus and ethmoid air cells. The orbits and globes intact. Other: None Cervical spine: Alignment: There is straightening of the normal cervical lordosis. Skull base and vertebrae: Visualized skull base is intact. No atlanto-occipital dissociation. The vertebral body heights are well maintained. Anterior osteophytes seen at C4-C5. No fracture or pathologic osseous lesion seen. Soft tissues and spinal canal: The visualized paraspinal soft tissues are unremarkable. No prevertebral soft tissue swelling is seen. The spinal canal is grossly unremarkable, no large epidural collection or significant canal narrowing. Disc levels:  No significant canal or neural foraminal narrowing.  Upper chest: The lung apices are clear. Thoracic inlet is within normal limits. Other: None IMPRESSION: No acute intracranial abnormality. Right maxillary and ethmoid sinusitis No acute fracture or malalignment of the spine. Electronically Signed   By: Jonna Clark M.D.   On: 08/06/2019 23:52   Dg Chest Portable 1 View  Result Date: 08/06/2019 CLINICAL DATA:  Seizure, temperature, pneumonia EXAM: PORTABLE CHEST 1 VIEW COMPARISON:  02/06/2019 FINDINGS: The heart size and mediastinal contours are within normal limits. Both lungs are clear. The visualized skeletal structures are unremarkable. IMPRESSION: No acute abnormality of the lungs in AP portable projection. Electronically Signed   By: Lauralyn Primes M.D.   On: 08/06/2019 17:34     Assessment/Plan: 28 year old male with a history of uncontrolled seizures presenting with breakthrough seizures.  Patient maintained on Lamictal, Zarontin and Depakote.  Depakote level is therapeutic.  Head CT reviewed and shows no acute changes.  Patient has been stable overnight.  Recommendations: 1. Increase Lamictal to 150mg  qAM, 200mg  Qhs.  Remaining antiepileptic doses to remain the same.  Would not continue Dilantin.   2. Seizure precautions 3. Patient unable to drive, operate heavy machinery, perform activities at heights and participate in water activities until release by outpatient physician. 4. Patient stable overnight.  Stable for discharge is ambulation is acceptable.    Thana Farr, MD Neurology [phone] 08/07/2019, 11:42 AM

## 2019-08-07 NOTE — ED Notes (Signed)
While attempting to draw labs pt grabbed urinal to void. Pt states he doesn't want to pee on himself. Pt jerked away from needle stick. MD aware.

## 2019-08-07 NOTE — ED Notes (Signed)
ED TO INPATIENT HANDOFF REPORT  ED Nurse Name and Phone #: Sue Lush [phone]   S Name/Age/Gender Daniel Craig 28 y.o. male Room/Bed: ED24A/ED24A  Code Status   Code Status: Prior  Home/SNF/Other group home Patient oriented to: self, place, time and situation Is this baseline? Yes   Triage Complete: Triage complete  Chief Complaint Seizure  Triage Note Pt was just d/c'd and awaiting cab ride home when he was observed by visitors having a seizure just outside the lobby. Pt found on the ground, bleeding from chin and abrasion to bilateral knees. Pt immediately taken to ED Rm 24 with Dr Fuller Plan at bedside.   Allergies Allergies  Allergen Reactions  . Keppra [Levetiracetam]   . Trileptal [Oxcarbazepine]   . Vimpat [Lacosamide]     Level of Care/Admitting Diagnosis ED Disposition    ED Disposition Condition Comment   Admit  Hospital Area: [facility] [[id number]]  Level of Care: Med-Surg [16]  Covid Evaluation: Asymptomatic Screening Protocol (No Symptoms)  Diagnosis: Epilepsy [facility]) [[id number]]  Admitting Physician: Arnaldo Natal [[id number]]  Attending Physician: Arnaldo Natal [id number]  PT Class (Do Not Modify): Observation [104]  PT Acc Code (Do Not Modify): Observation [10022]       B Medical/Surgery History Past Medical History:  Diagnosis Date  . Schizophrenia (HCC)   . Seizures (HCC)    Past Surgical History:  Procedure Laterality Date  . MOUTH SURGERY       A IV Location/Drains/Wounds Patient Lines/Drains/Airways Status   Active Line/Drains/Airways    Name:   Placement date:   Placement time:   Site:   Days:   Peripheral IV 08/06/19 Left Forearm   08/06/19    2234    Forearm   1          Intake/Output Last 24 hours No intake or output data in the 24 hours ending 08/07/19 0210  Labs/Imaging Results for orders placed or performed during the hospital encounter of 08/06/19 (from the past 48 hour(s))  Glucose, capillary      Status: None   Collection Time: 08/06/19 10:30 PM  Result Value Ref Range   Glucose-Capillary 94 70 - 99 mg/dL   Ct Head Wo Contrast  Result Date: 08/06/2019 CLINICAL DATA:  Recently discharged with awaiting cab ride, found having seizure EXAM: CT HEAD WITHOUT CONTRAST; CT CERVICAL SPINE WITHOUT CONTRAST TECHNIQUE: Contiguous axial images were obtained from the base of the skull through the vertex without intravenous contrast. COMPARISON:  CT head same day FINDINGS: Brain: No evidence of acute territorial infarction, hemorrhage, hydrocephalus,extra-axial collection or mass lesion/mass effect. Normal gray-white differentiation. Ventricles are normal in size and contour. Vascular: No hyperdense vessel or unexpected calcification. Skull: The skull is intact. No fracture or focal lesion identified. Sinuses/Orbits: There is fluid seen within the right maxillary sinus and ethmoid air cells. The orbits and globes intact. Other: None Cervical spine: Alignment: There is straightening of the normal cervical lordosis. Skull base and vertebrae: Visualized skull base is intact. No atlanto-occipital dissociation. The vertebral body heights are well maintained. Anterior osteophytes seen at C4-C5. No fracture or pathologic osseous lesion seen. Soft tissues and spinal canal: The visualized paraspinal soft tissues are unremarkable. No prevertebral soft tissue swelling is seen. The spinal canal is grossly unremarkable, no large epidural collection or significant canal narrowing. Disc levels:  No significant canal or neural foraminal narrowing. Upper chest: The lung apices are clear. Thoracic inlet is within normal limits. Other: None IMPRESSION: No acute intracranial  abnormality. Right maxillary and ethmoid sinusitis No acute fracture or malalignment of the spine. Electronically Signed   By: Jonna ClarkBindu  Avutu M.D.   On: 08/06/2019 23:52   Ct Head Wo Contrast  Result Date: 08/06/2019 CLINICAL DATA:  28 year old male with  seizures. No trauma. EXAM: CT HEAD WITHOUT CONTRAST TECHNIQUE: Contiguous axial images were obtained from the base of the skull through the vertex without intravenous contrast. COMPARISON:  Head CT dated 11/04/2018 FINDINGS: Brain: No evidence of acute infarction, hemorrhage, hydrocephalus, extra-axial collection or mass lesion/mass effect. Vascular: No hyperdense vessel or unexpected calcification. Skull: Normal. Negative for fracture or focal lesion. Sinuses/Orbits: There is opacification of the visualized right maxillary sinus and multiple right ethmoid air cells. The mastoid air cells are clear. Other: None IMPRESSION: 1. Normal unenhanced CT of the brain. 2. Paranasal sinus disease. Electronically Signed   By: Elgie CollardArash  Radparvar M.D.   On: 08/06/2019 18:02   Ct Cervical Spine Wo Contrast  Result Date: 08/06/2019 CLINICAL DATA:  Recently discharged with awaiting cab ride, found having seizure EXAM: CT HEAD WITHOUT CONTRAST; CT CERVICAL SPINE WITHOUT CONTRAST TECHNIQUE: Contiguous axial images were obtained from the base of the skull through the vertex without intravenous contrast. COMPARISON:  CT head same day FINDINGS: Brain: No evidence of acute territorial infarction, hemorrhage, hydrocephalus,extra-axial collection or mass lesion/mass effect. Normal gray-white differentiation. Ventricles are normal in size and contour. Vascular: No hyperdense vessel or unexpected calcification. Skull: The skull is intact. No fracture or focal lesion identified. Sinuses/Orbits: There is fluid seen within the right maxillary sinus and ethmoid air cells. The orbits and globes intact. Other: None Cervical spine: Alignment: There is straightening of the normal cervical lordosis. Skull base and vertebrae: Visualized skull base is intact. No atlanto-occipital dissociation. The vertebral body heights are well maintained. Anterior osteophytes seen at C4-C5. No fracture or pathologic osseous lesion seen. Soft tissues and spinal  canal: The visualized paraspinal soft tissues are unremarkable. No prevertebral soft tissue swelling is seen. The spinal canal is grossly unremarkable, no large epidural collection or significant canal narrowing. Disc levels:  No significant canal or neural foraminal narrowing. Upper chest: The lung apices are clear. Thoracic inlet is within normal limits. Other: None IMPRESSION: No acute intracranial abnormality. Right maxillary and ethmoid sinusitis No acute fracture or malalignment of the spine. Electronically Signed   By: Jonna ClarkBindu  Avutu M.D.   On: 08/06/2019 23:52   Dg Chest Portable 1 View  Result Date: 08/06/2019 CLINICAL DATA:  Seizure, temperature, pneumonia EXAM: PORTABLE CHEST 1 VIEW COMPARISON:  02/06/2019 FINDINGS: The heart size and mediastinal contours are within normal limits. Both lungs are clear. The visualized skeletal structures are unremarkable. IMPRESSION: No acute abnormality of the lungs in AP portable projection. Electronically Signed   By: Lauralyn PrimesAlex  Bibbey M.D.   On: 08/06/2019 17:34    Pending Labs Unresulted Labs (From admission, onward)    Start     Ordered   08/06/19 2220  Lamotrigine level  Once,   STAT     08/06/19 2219   Signed and Held  Creatinine, serum  (enoxaparin (LOVENOX)    CrCl >/= 30 ml/min)  Weekly,   R    Comments: while on enoxaparin therapy    Signed and Held   Signed and Held  TSH  Add-on,   R     Signed and Held          Vitals/Pain Today's Vitals   08/07/19 0054 08/07/19 0135 08/07/19 0137 08/07/19 0201  BP:  103/67 115/67  Pulse: 74  (!) 59 83  Resp: 18  16 18   Temp:      TempSrc:      SpO2:   100% 100%  PainSc:  Asleep Asleep     Isolation Precautions No active isolations  Medications Medications  phenytoin (DILANTIN) 2,000 mg in sodium chloride 0.9 % 250 mL IVPB (has no administration in time range)  LORazepam (ATIVAN) injection 1 mg (1 mg Intravenous Given 08/06/19 2235)  Tdap (BOOSTRIX) injection 0.5 mL (0.5 mLs Intramuscular  Given 08/06/19 2315)  lamoTRIgine (LAMICTAL) tablet 200 mg (200 mg Oral Given 08/06/19 2315)  sodium chloride 0.9 % bolus 1,000 mL (1,000 mLs Intravenous New Bag/Given 08/07/19 0003)    Mobility walks High fall risk   Focused Assessments Cardiac Assessment Handoff:    Lab Results  Component Value Date   CKTOTAL 170 11/09/2018   No results found for: DDIMER Does the Patient currently have chest pain? No     R Recommendations: See Admitting Provider Note  Report given to:   Additional Notes: n/a

## 2019-08-07 NOTE — Progress Notes (Signed)
Got notified for unwitnessed fall by nurse.  Reports no neurological changes, patient on several different psych meds per nurse.  I have asked to monitor him closely for any neurological changes and notify immediately for any neuro changes. will order neurochecks for now.  If any neurological changes obtain CT head stat

## 2019-08-07 NOTE — ED Notes (Signed)
Attempted to draw labs multiple times without success. Lab called for assistance. Pt cooperative and talkative at this time. No distress noted.

## 2019-08-08 ENCOUNTER — Observation Stay: Payer: Medicaid Other

## 2019-08-08 DIAGNOSIS — R569 Unspecified convulsions: Secondary | ICD-10-CM | POA: Diagnosis not present

## 2019-08-08 LAB — CBC
HCT: 39.2 % (ref 39.0–52.0)
Hemoglobin: 13.7 g/dL (ref 13.0–17.0)
MCH: 30.6 pg (ref 26.0–34.0)
MCHC: 34.9 g/dL (ref 30.0–36.0)
MCV: 87.5 fL (ref 80.0–100.0)
Platelets: 173 10*3/uL (ref 150–400)
RBC: 4.48 MIL/uL (ref 4.22–5.81)
RDW: 12.1 % (ref 11.5–15.5)
WBC: 9.6 10*3/uL (ref 4.0–10.5)
nRBC: 0 % (ref 0.0–0.2)

## 2019-08-08 LAB — BASIC METABOLIC PANEL
Anion gap: 15 (ref 5–15)
BUN: 7 mg/dL (ref 6–20)
CO2: 21 mmol/L — ABNORMAL LOW (ref 22–32)
Calcium: 9.2 mg/dL (ref 8.9–10.3)
Chloride: 103 mmol/L (ref 98–111)
Creatinine, Ser: 0.97 mg/dL (ref 0.61–1.24)
GFR calc Af Amer: >60 mL/min (ref >60)
GFR calc non Af Amer: >60 mL/min (ref >60)
Glucose, Bld: 85 mg/dL (ref 70–99)
Potassium: 4.2 mmol/L (ref 3.5–5.1)
Sodium: 139 mmol/L (ref 135–145)

## 2019-08-08 LAB — PHENYTOIN LEVEL, TOTAL: Phenytoin Lvl: 13.7 ug/mL (ref 10.0–20.0)

## 2019-08-08 MED ORDER — LAMOTRIGINE 100 MG PO TABS
200.0000 mg | ORAL_TABLET | Freq: Every day | ORAL | Status: DC
  Administered 2019-08-08 – 2019-08-09 (×2): 200 mg via ORAL
  Filled 2019-08-08 (×2): qty 2

## 2019-08-08 MED ORDER — LAMOTRIGINE 100 MG PO TABS
100.0000 mg | ORAL_TABLET | Freq: Every morning | ORAL | Status: DC
  Administered 2019-08-09 – 2019-08-10 (×2): 100 mg via ORAL
  Filled 2019-08-08 (×2): qty 1

## 2019-08-08 NOTE — Progress Notes (Signed)
Subjective: Patient had a fall last night.  Unclear if he fell due to imbalance or had a seizure.  Patient reports that he remembers trying to get out of bed but no realizing he was going to fall.    Objective: Current vital signs: BP 126/80 (BP Location: Right Arm)   Pulse 92   Temp 98.7 F (37.1 C) (Oral)   Resp 18   Ht 6' 0.01" (1.829 m)   Wt 98.9 kg   SpO2 100%   BMI 29.56 kg/m  Vital signs in last 24 hours: Temp:  [98.7 F (37.1 C)-99.5 F (37.5 C)] 98.7 F (37.1 C) (09/12 0916) Pulse Rate:  [64-92] 92 (09/12 0916) Resp:  [16-20] 18 (09/12 0916) BP: (102-126)/(52-80) 126/80 (09/12 0916) SpO2:  [100 %] 100 % (09/12 0916)  Intake/Output from previous day: 09/11 0701 - 09/12 0700 In: 480 [P.O.:480] Out: 2000 [Urine:2000] Intake/Output this shift: No intake/output data recorded. Nutritional status:  Diet Order            Diet regular Room service appropriate? Yes; Fluid consistency: Thin  Diet effective now        Diet - low sodium heart healthy              Neurologic Exam: Mental Status: Alert, oriented, thought content appropriate.  Speech fluent without evidence of aphasia.  Able to follow 3 step commands without difficulty. Cranial Nerves: II: Discs flat bilaterally; Visual fields grossly normal, pupils equal, round, reactive to light and accommodation III,IV, VI: ptosis not present, extra-ocular motions intact bilaterally.  Continued nystagmus V,VII: smile symmetric, facial light touch sensation normal bilaterally VIII: hearing normal bilaterally IX,X: gag reflex present XI: bilateral shoulder shrug XII: midline tongue extension Motor: 5/5 throughout Sensory: Pinprick and light touch intact throughout, bilaterally   Lab Results: Basic Metabolic Panel: Recent Labs  Lab 08/06/19 1601 08/08/19 0709  NA 133* 139  K 4.0 4.2  CL 99 103  CO2 22 21*  GLUCOSE 125* 85  BUN 11 7  CREATININE 1.27* 0.97  CALCIUM 9.6 9.2    Liver Function  Tests: Recent Labs  Lab 08/06/19 1601  AST 28  ALT 11  ALKPHOS 41  BILITOT 0.9  PROT 7.8  ALBUMIN 4.3   No results for input(s): LIPASE, AMYLASE in the last 168 hours. No results for input(s): AMMONIA in the last 168 hours.  CBC: Recent Labs  Lab 08/06/19 1601 08/08/19 0828  WBC 17.4* 9.6  NEUTROABS 12.9*  --   HGB 14.4 13.7  HCT 41.8 39.2  MCV 88.7 87.5  PLT 187 173    Cardiac Enzymes: No results for input(s): CKTOTAL, CKMB, CKMBINDEX, TROPONINI in the last 168 hours.  Lipid Panel: No results for input(s): CHOL, TRIG, HDL, CHOLHDL, VLDL, LDLCALC in the last 168 hours.  CBG: Recent Labs  Lab 08/06/19 2230  GLUCAP 94    Microbiology: Results for orders placed or performed during the hospital encounter of 08/06/19  SARS Coronavirus 2 [facility] order, Performed in [facility] hospital lab) Nasopharyngeal Nasopharyngeal Swab     Status: None   Collection Time: 08/06/19  6:04 PM   Specimen: Nasopharyngeal Swab  Result Value Ref Range Status   SARS Coronavirus 2 NEGATIVE NEGATIVE Final    Comment: (NOTE) If result is NEGATIVE SARS-CoV-2 target nucleic acids are NOT DETECTED. The SARS-CoV-2 RNA is generally detectable in upper and lower  respiratory specimens during the acute phase of infection. The lowest  concentration of SARS-CoV-2 viral copies this assay can  detect is 250  copies / mL. A negative result does not preclude SARS-CoV-2 infection  and should not be used as the sole basis for treatment or other  patient management decisions.  A negative result may occur with  improper specimen collection / handling, submission of specimen other  than nasopharyngeal swab, presence of viral mutation(s) within the  areas targeted by this assay, and inadequate number of viral copies  (<250 copies / mL). A negative result must be combined with clinical  observations, patient history, and epidemiological information. If result is POSITIVE SARS-CoV-2 target nucleic acids  are DETECTED. The SARS-CoV-2 RNA is generally detectable in upper and lower  respiratory specimens dur ing the acute phase of infection.  Positive  results are indicative of active infection with SARS-CoV-2.  Clinical  correlation with patient history and other diagnostic information is  necessary to determine patient infection status.  Positive results do  not rule out bacterial infection or co-infection with other viruses. If result is PRESUMPTIVE POSTIVE SARS-CoV-2 nucleic acids MAY BE PRESENT.   A presumptive positive result was obtained on the submitted specimen  and confirmed on repeat testing.  While 2019 novel coronavirus  (SARS-CoV-2) nucleic acids may be present in the submitted sample  additional confirmatory testing may be necessary for epidemiological  and / or clinical management purposes  to differentiate between  SARS-CoV-2 and other Sarbecovirus currently known to infect humans.  If clinically indicated additional testing with an alternate test  methodology [phone]([id number]) is advised. The SARS-CoV-2 RNA is generally  detectable in upper and lower respiratory sp ecimens during the acute  phase of infection. The expected result is Negative. Fact Sheet for Patients:  BoilerBrush.com.cyhttps://www.fda.gov/media/136312/download Fact Sheet for Healthcare Providers: https://pope.com/https://www.fda.gov/media/136313/download This test is not yet approved or cleared by the [place] FDA and has been authorized for detection and/or diagnosis of SARS-CoV-2 by FDA under an Emergency Use Authorization (EUA).  This EUA will remain in effect (meaning this test can be used) for the duration of the COVID-19 declaration under Section 564(b)(1) of the Act, 21 U.S.C. section 360bbb-3(b)(1), unless the authorization is terminated or revoked sooner. Performed at [facility], [address] [place], [place] [address]     Coagulation Studies: No results for input(s): LABPROT, INR in the last 72  hours.  Imaging: Ct Head Wo Contrast  Result Date: 08/06/2019 CLINICAL DATA:  Recently discharged with awaiting cab ride, found having seizure EXAM: CT HEAD WITHOUT CONTRAST; CT CERVICAL SPINE WITHOUT CONTRAST TECHNIQUE: Contiguous axial images were obtained from the base of the skull through the vertex without intravenous contrast. COMPARISON:  CT head same day FINDINGS: Brain: No evidence of acute territorial infarction, hemorrhage, hydrocephalus,extra-axial collection or mass lesion/mass effect. Normal gray-white differentiation. Ventricles are normal in size and contour. Vascular: No hyperdense vessel or unexpected calcification. Skull: The skull is intact. No fracture or focal lesion identified. Sinuses/Orbits: There is fluid seen within the right maxillary sinus and ethmoid air cells. The orbits and globes intact. Other: None Cervical spine: Alignment: There is straightening of the normal cervical lordosis. Skull base and vertebrae: Visualized skull base is intact. No atlanto-occipital dissociation. The vertebral body heights are well maintained. Anterior osteophytes seen at C4-C5. No fracture or pathologic osseous lesion seen. Soft tissues and spinal canal: The visualized paraspinal soft tissues are unremarkable. No prevertebral soft tissue swelling is seen. The spinal canal is grossly unremarkable, no large epidural collection or significant canal narrowing. Disc levels:  No significant canal or neural foraminal narrowing.  Upper chest: The lung apices are clear. Thoracic inlet is within normal limits. Other: None IMPRESSION: No acute intracranial abnormality. Right maxillary and ethmoid sinusitis No acute fracture or malalignment of the spine. Electronically Signed   By: Jonna ClarkBindu  Avutu M.D.   On: 08/06/2019 23:52   Ct Head Wo Contrast  Result Date: 08/06/2019 CLINICAL DATA:  28 year old male with seizures. No trauma. EXAM: CT HEAD WITHOUT CONTRAST TECHNIQUE: Contiguous axial images were obtained from  the base of the skull through the vertex without intravenous contrast. COMPARISON:  Head CT dated 11/04/2018 FINDINGS: Brain: No evidence of acute infarction, hemorrhage, hydrocephalus, extra-axial collection or mass lesion/mass effect. Vascular: No hyperdense vessel or unexpected calcification. Skull: Normal. Negative for fracture or focal lesion. Sinuses/Orbits: There is opacification of the visualized right maxillary sinus and multiple right ethmoid air cells. The mastoid air cells are clear. Other: None IMPRESSION: 1. Normal unenhanced CT of the brain. 2. Paranasal sinus disease. Electronically Signed   By: Elgie CollardArash  Radparvar M.D.   On: 08/06/2019 18:02   Ct Cervical Spine Wo Contrast  Result Date: 08/06/2019 CLINICAL DATA:  Recently discharged with awaiting cab ride, found having seizure EXAM: CT HEAD WITHOUT CONTRAST; CT CERVICAL SPINE WITHOUT CONTRAST TECHNIQUE: Contiguous axial images were obtained from the base of the skull through the vertex without intravenous contrast. COMPARISON:  CT head same day FINDINGS: Brain: No evidence of acute territorial infarction, hemorrhage, hydrocephalus,extra-axial collection or mass lesion/mass effect. Normal gray-white differentiation. Ventricles are normal in size and contour. Vascular: No hyperdense vessel or unexpected calcification. Skull: The skull is intact. No fracture or focal lesion identified. Sinuses/Orbits: There is fluid seen within the right maxillary sinus and ethmoid air cells. The orbits and globes intact. Other: None Cervical spine: Alignment: There is straightening of the normal cervical lordosis. Skull base and vertebrae: Visualized skull base is intact. No atlanto-occipital dissociation. The vertebral body heights are well maintained. Anterior osteophytes seen at C4-C5. No fracture or pathologic osseous lesion seen. Soft tissues and spinal canal: The visualized paraspinal soft tissues are unremarkable. No prevertebral soft tissue swelling is seen.  The spinal canal is grossly unremarkable, no large epidural collection or significant canal narrowing. Disc levels:  No significant canal or neural foraminal narrowing. Upper chest: The lung apices are clear. Thoracic inlet is within normal limits. Other: None IMPRESSION: No acute intracranial abnormality. Right maxillary and ethmoid sinusitis No acute fracture or malalignment of the spine. Electronically Signed   By: Jonna ClarkBindu  Avutu M.D.   On: 08/06/2019 23:52   Dg Chest Portable 1 View  Result Date: 08/06/2019 CLINICAL DATA:  Seizure, temperature, pneumonia EXAM: PORTABLE CHEST 1 VIEW COMPARISON:  02/06/2019 FINDINGS: The heart size and mediastinal contours are within normal limits. Both lungs are clear. The visualized skeletal structures are unremarkable. IMPRESSION: No acute abnormality of the lungs in AP portable projection. Electronically Signed   By: Lauralyn PrimesAlex  Bibbey M.D.   On: 08/06/2019 17:34    Medications:  I have reviewed the patient's current medications. Scheduled: . divalproex  500 mg Oral q morning - 10a   And  . divalproex  1,000 mg Oral QHS  . docusate sodium  100 mg Oral BID  . enoxaparin (LOVENOX) injection  40 mg Subcutaneous Q24H  . ethosuximide  250 mg Oral BID  . lamoTRIgine  150 mg Oral q morning - 10a   And  . lamoTRIgine  200 mg Oral QHS  . levothyroxine  50 mcg Oral QAC breakfast  . OLANZapine  10 mg Oral  q morning - 10a   And  . OLANZapine  20 mg Oral QHS  . propranolol ER  60 mg Oral Daily  . risperiDONE  6 mg Oral QHS  . traZODone  150 mg Oral QHS  . vitamin C  1,000 mg Oral Daily  . [START ON 08/09/2019] Vitamin D (Ergocalciferol)  50,000 Units Oral Q7 days    Assessment/Plan: 28 year old male with a history of seizures presenting with breakthrough seizures.  Yesterday noted to have difficulty with gait.  Felt to be secondary to Dilantin load that he received.  Found down overnight and unclear if seizure or fall.  Patient with continued  nystagmus.  Recommendations: 1. Repeat head CT without contrast 2. PT evaluation 3. Continue seizure precautions   LOS: 0 days   Alexis Goodell, MD Neurology [phone] 08/08/2019  10:17 AM

## 2019-08-08 NOTE — Plan of Care (Signed)

## 2019-08-08 NOTE — Progress Notes (Signed)
[facility] [place] [facility]   PATIENT NAME: Daniel FavaKeon Craig    MR#:  [id number]  DATE OF BIRTH:  19-May-1991  SUBJECTIVE:  CHIEF COMPLAINT:   Chief Complaint  Patient presents with  . Seizures   Patient reported to have had a fall last night.  Patient does not have full recollection of the event/fall last night. Seen together with neurologist this morning.  Patient still having significant nystagmus.  Denies any focal weakness. Stat CT head requested.  REVIEW OF SYSTEMS:  Review of Systems  Constitutional: Negative for chills and fever.  HENT: Negative for hearing loss and tinnitus.   Eyes: Negative for blurred vision and double vision.  Respiratory: Negative for cough and shortness of breath.   Cardiovascular: Negative for chest pain and palpitations.  Gastrointestinal: Negative for heartburn, nausea and vomiting.  Genitourinary: Negative for dysuria and urgency.  Musculoskeletal: Negative for myalgias.       Fall last night  Skin: Negative for itching and rash.  Neurological:       Admitted with seizures  Psychiatric/Behavioral: Negative for depression and hallucinations.    DRUG ALLERGIES:   Allergies  Allergen Reactions  . Keppra [Levetiracetam]   . Trileptal [Oxcarbazepine]   . Vimpat [Lacosamide]    VITALS:  Blood pressure 130/78, pulse 95, temperature 99.1 F (37.3 C), temperature source Oral, resp. rate 20, height 6' 0.01" (1.829 m), weight 98.9 kg, SpO2 100 %. PHYSICAL EXAMINATION:   Physical Exam  Constitutional: He is oriented to person, place, and time. He appears well-developed and well-nourished.  HENT:  Head: Normocephalic and atraumatic.  Right Ear: External ear normal.  Positive for mostly horizontal nystagmus  Eyes: Pupils are equal, round, and reactive to light. Conjunctivae are normal. Right eye exhibits no discharge.  Neck: Normal range of motion. Neck supple. No thyromegaly present.  Cardiovascular: Normal rate,  regular rhythm and normal heart sounds.  Respiratory: Effort normal and breath sounds normal.  GI: Soft. Bowel sounds are normal. He exhibits no distension.  Musculoskeletal: Normal range of motion.        General: No edema.  Neurological: He is alert and oriented to person, place, and time. No cranial nerve deficit.  Skin: Skin is warm. He is not diaphoretic. No erythema.  Psychiatric: He has a normal mood and affect. His behavior is normal.   LABORATORY PANEL:  Male CBC Recent Labs  Lab 08/08/19 0828  WBC 9.6  HGB 13.7  HCT 39.2  PLT 173   ------------------------------------------------------------------------------------------------------------------ Chemistries  Recent Labs  Lab 08/06/19 1601 08/08/19 0709  NA 133* 139  K 4.0 4.2  CL 99 103  CO2 22 21*  GLUCOSE 125* 85  BUN 11 7  CREATININE 1.27* 0.97  CALCIUM 9.6 9.2  AST 28  --   ALT 11  --   ALKPHOS 41  --   BILITOT 0.9  --    RADIOLOGY:  Ct Head Wo Contrast  Result Date: 08/08/2019 CLINICAL DATA:  Unwitnessed fall. EXAM: CT HEAD WITHOUT CONTRAST TECHNIQUE: Contiguous axial images were obtained from the base of the skull through the vertex without intravenous contrast. COMPARISON:  08/06/2019 FINDINGS: Brain: There is no evidence for acute hemorrhage, hydrocephalus, mass lesion, or abnormal extra-axial fluid collection. No definite CT evidence for acute infarction. Vascular: No hyperdense vessel or unexpected calcification. Skull: No evidence for fracture. No worrisome lytic or sclerotic lesion. Sinuses/Orbits: Opacification noted right maxillary sinus, right anterior ethmoid air cells, and right frontal sinus. Visualized portions  of the globes and intraorbital fat are unremarkable. Other: None. IMPRESSION: 1. No acute intracranial abnormality. 2. 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 Electronically Signed   By: Misty Stanley M.D.   On: 08/08/2019 10:28    ASSESSMENT AND PLAN:   This is a 28 year old male admitted for epileptic seizures. 1. Epilepsy:  Patient with history of seizures who presented with breakthrough seizures.  Patient was loaded with Dilantin 2 g at the ED on presentation.  Patient appears to be having difficulty with gait.  Having some nystagmus. CT head without contrast done today with no acute intracranial findings apart from right paranasal sinus disease which does not appear to be new. Seen by neurologist.  Appreciate input. Follow-up on Dilantin level. Follow-up on MRI of the brain without contrast when done Patient evaluated by physical therapist today.  Still very unsteady on his gait and not safe for discharge yet.  Physical therapy to continue working with patient. Continue seizure precautions. Continue ethosuximide as well as Depakote.   Neurologist changed dose of Lamictal 200 mg every morning.  Continue Lamictal at 200 mg nightly.  2. Acute kidney injury:  Resolved with IV fluids.    3. Overweight: BMI is 29.5; encouraged healthy diet and exercise Tobacco abuse.  Smoking cessation was counseled for 3 to 4 minutes. Marijuana abuse.  Counseled.  DVT prophylaxis; Lovenox   All the records are reviewed and case discussed with Care Management/Social Worker. Management plans discussed with the patient, family and they are in agreement.  CODE STATUS: Full Code  TOTAL TIME TAKING CARE OF THIS PATIENT: 37 minutes.   More than 50% of the time was spent in counseling/coordination of care: YES  POSSIBLE D/C IN 1-2 DAYS, DEPENDING ON CLINICAL CONDITION.   Mack Thurmon M.D on 08/08/2019 at 1:35 PM  Between 7am to 6pm - Pager - [phone]  After 6pm go to [email or web address] - Proofreader  [facility]  Office  [phone]  CC: Primary care physician; [place]  Note: This dictation was prepared with Dragon dictation along with smaller phrase technology. Any  transcriptional errors that result from this process are unintentional.

## 2019-08-08 NOTE — Plan of Care (Signed)
  Problem: Education: Goal: Knowledge of General Education information will improve Description: Including pain rating scale, medication(s)/side effects and non-pharmacologic comfort measures 08/08/2019 0328 by Herbie Baltimore, RN Outcome: Progressing 08/08/2019 0217 by Herbie Baltimore, RN Outcome: Progressing   Problem: Health Behavior/Discharge Planning: Goal: Ability to manage health-related needs will improve 08/08/2019 0328 by Herbie Baltimore, RN Outcome: Progressing 08/08/2019 0217 by Herbie Baltimore, RN Outcome: Progressing   Problem: Clinical Measurements: Goal: Ability to maintain clinical measurements within normal limits will improve 08/08/2019 0328 by Herbie Baltimore, RN Outcome: Progressing 08/08/2019 0217 by Herbie Baltimore, RN Outcome: Progressing Goal: Will remain free from infection 08/08/2019 0328 by Herbie Baltimore, RN Outcome: Progressing 08/08/2019 0217 by Herbie Baltimore, RN Outcome: Progressing Goal: Diagnostic test results will improve 08/08/2019 0328 by Herbie Baltimore, RN Outcome: Progressing 08/08/2019 0217 by Herbie Baltimore, RN Outcome: Progressing Goal: Respiratory complications will improve 08/08/2019 0328 by Herbie Baltimore, RN Outcome: Progressing 08/08/2019 0217 by Herbie Baltimore, RN Outcome: Progressing Goal: Cardiovascular complication will be avoided 08/08/2019 0328 by Herbie Baltimore, RN Outcome: Progressing 08/08/2019 0217 by Herbie Baltimore, RN Outcome: Progressing   Problem: Activity: Goal: Risk for activity intolerance will decrease 08/08/2019 0328 by Herbie Baltimore, RN Outcome: Progressing 08/08/2019 0217 by Herbie Baltimore, RN Outcome: Progressing   Problem: Nutrition: Goal: Adequate nutrition will be maintained 08/08/2019 0328 by Herbie Baltimore, RN Outcome: Progressing 08/08/2019 0217 by Herbie Baltimore, RN Outcome: Progressing   Problem:  Coping: Goal: Level of anxiety will decrease 08/08/2019 0328 by Herbie Baltimore, RN Outcome: Progressing 08/08/2019 0217 by Herbie Baltimore, RN Outcome: Progressing   Problem: Elimination: Goal: Will not experience complications related to bowel motility 08/08/2019 0328 by Herbie Baltimore, RN Outcome: Progressing 08/08/2019 0217 by Herbie Baltimore, RN Outcome: Progressing Goal: Will not experience complications related to urinary retention 08/08/2019 0328 by Herbie Baltimore, RN Outcome: Progressing 08/08/2019 0217 by Herbie Baltimore, RN Outcome: Progressing   Problem: Pain Managment: Goal: General experience of comfort will improve 08/08/2019 0328 by Herbie Baltimore, RN Outcome: Progressing 08/08/2019 0217 by Herbie Baltimore, RN Outcome: Progressing   Problem: Safety: Goal: Ability to remain free from injury will improve 08/08/2019 0328 by Herbie Baltimore, RN Outcome: Progressing 08/08/2019 0217 by Herbie Baltimore, RN Outcome: Progressing   Problem: Skin Integrity: Goal: Risk for impaired skin integrity will decrease 08/08/2019 0328 by Herbie Baltimore, RN Outcome: Progressing 08/08/2019 0217 by Herbie Baltimore, RN Outcome: Progressing

## 2019-08-08 NOTE — Progress Notes (Signed)
Physical Therapy Evaluation Patient Details Name: Daniel Craig MRN: [id number] DOB: 09/21/1991 Today's Date: 08/08/2019   History of Present Illness   The patient with past medical history of epilepsy presents to the emergency department immediately following discharge from the hospital after being found on the curb awaiting his taxi in a postictal state.    Clinical Impression  Patient agrees to PT assessment. Patient is impulsive with mobility but cooperative.His strength BLE is WFL. He performs supine to sit independently; transfers sit to stand with min assist for balance deficits including trunk sway and need for UE to stabilize and maintain static stand. He needs HHA +2 for ambulation 100 feet with gait deviations including path deviation, trunk sway and BLE crossing over midline. Patient has no safety awareness with transfers or gait. He will benefit from skilled PT to improve mobility and safety.      Follow Up Recommendations Supervision/Assistance - 24 hour;Home health PT    Equipment Recommendations  Rolling walker with 5" wheels    Recommendations for Other Services       Precautions / Restrictions Precautions Precautions: Fall Restrictions Weight Bearing Restrictions: No      Mobility  Bed Mobility Overal bed mobility: Independent             General bed mobility comments: impulsive  Transfers Overall transfer level: Needs assistance Equipment used: 1 person hand held assist Transfers: Sit to/from Stand Sit to Stand: (Patient is impulsive and not aware of poor safety)         General transfer comment: initial static stand in poor with posterior loss of balance  Ambulation/Gait Ambulation/Gait assistance: +2 physical assistance Gait Distance (Feet): 100 Feet Assistive device: 2 person hand held assist Gait Pattern/deviations: (crossing feet, deviated path, staggering, trunk sway)        Stairs            Wheelchair Mobility     Modified Rankin (Stroke Patients Only)       Balance Overall balance assessment: History of Falls;Needs assistance   Sitting balance-Leahy Scale: Good     Standing balance support: Bilateral upper extremity supported Standing balance-Leahy Scale: Poor Standing balance comment: (loss of balance posterior, trunk sway)                             Pertinent Vitals/Pain Pain Assessment: No/denies pain    Home Living Family/patient expects to be discharged to:: Private residence Living Arrangements: Alone Available Help at Discharge: Other (Comment)(unknown) Type of Home: House Home Access: Stairs to enter   [employer] Corporation[employer]trance Stairs-Number of Steps: 12          Prior Function Level of Independence: Independent(per patient)               Hand Dominance        Extremity/Trunk Assessment   Upper Extremity Assessment Upper Extremity Assessment: Generalized weakness    Lower Extremity Assessment Lower Extremity Assessment: Overall WFL for tasks assessed       Communication   Communication: No difficulties  Cognition Arousal/Alertness: Awake/alert Behavior During Therapy: Restless;Impulsive                                          General Comments      Exercises     Assessment/Plan    PT Assessment Patient needs continued PT  services  PT Problem List Decreased balance;Decreased mobility;Decreased coordination;Decreased safety awareness       PT Treatment Interventions Gait training;Therapeutic activities;Balance training;Therapeutic exercise    PT Goals (Current goals can be found in the Care Plan section)  Acute Rehab PT Goals Patient Stated Goal: no goals stated PT Goal Formulation: Patient unable to participate in goal setting Time For Goal Achievement: 08/22/19 Potential to Achieve Goals: Fair    Frequency Min 2X/week   Barriers to discharge   (lives alone)    Co-evaluation               AM-PAC PT "6  Clicks" Mobility  Outcome Measure Help needed turning from your back to your side while in a flat bed without using bedrails?: None Help needed moving from lying on your back to sitting on the side of a flat bed without using bedrails?: None Help needed moving to and from a bed to a chair (including a wheelchair)?: A Lot Help needed standing up from a chair using your arms (e.g., wheelchair or bedside chair)?: A Lot Help needed to walk in hospital room?: A Lot Help needed climbing 3-5 steps with a railing? : A Lot 6 Click Score: 16    End of Session Equipment Utilized During Treatment: Gait belt Activity Tolerance: Patient tolerated treatment well Patient left: in bed;with nursing/sitter in room   PT Visit Diagnosis: Unsteadiness on feet (R26.81);Other abnormalities of gait and mobility (R26.89);History of falling (Z91.81)    Time: 3500-9381 PT Time Calculation (min) (ACUTE ONLY): 15 min   Charges:   PT Evaluation $PT Eval Low Complexity: 1 Low            Bader Stubblefield, Sherryl Barters, PT DPT 08/08/2019, 1:21 PM

## 2019-08-08 NOTE — Progress Notes (Signed)
   08/07/19 2235  What Happened  Was fall witnessed? No  Was patient injured? No  Patient found on floor  Found by Staff-comment  Stated prior activity other (comment) (in bed)  Follow Up  MD notified Dr. Manuella Ghazi  Time MD notified 2245  Family notified No - patient refusal  Additional tests No  Adult Fall Risk Assessment  Risk Factor Category (scoring not indicated) Fall has occurred during this admission (document High fall risk)  Patient Fall Risk Level High fall risk  Adult Fall Risk Interventions  Required Bundle Interventions *See Row Information* High fall risk - low, moderate, and high requirements implemented  Additional Interventions Use of appropriate toileting equipment (bedpan, BSC, etc.)  Screening for Fall Injury Risk (To be completed on HIGH fall risk patients) - Assessing Need for Low Bed  Risk For Fall Injury- Low Bed Criteria Previous fall this admission  Will Implement Low Bed and Floor Mats Yes  Pain Assessment  Pain Scale 0-10  Pain Score 0  Neurological  Neuro (WDL) X  Level of Consciousness Alert  Orientation Level Disoriented to situation  Cognition Poor safety awareness;Poor judgement;Poor attention/concentration  Speech Clear

## 2019-08-09 ENCOUNTER — Inpatient Hospital Stay: Payer: Medicaid Other

## 2019-08-09 DIAGNOSIS — J324 Chronic pansinusitis: Secondary | ICD-10-CM | POA: Diagnosis present

## 2019-08-09 DIAGNOSIS — Z888 Allergy status to other drugs, medicaments and biological substances status: Secondary | ICD-10-CM | POA: Diagnosis not present

## 2019-08-09 DIAGNOSIS — F1210 Cannabis abuse, uncomplicated: Secondary | ICD-10-CM | POA: Diagnosis present

## 2019-08-09 DIAGNOSIS — R569 Unspecified convulsions: Secondary | ICD-10-CM | POA: Diagnosis present

## 2019-08-09 DIAGNOSIS — Y92230 Patient room in hospital as the place of occurrence of the external cause: Secondary | ICD-10-CM | POA: Diagnosis not present

## 2019-08-09 DIAGNOSIS — Z79890 Hormone replacement therapy (postmenopausal): Secondary | ICD-10-CM | POA: Diagnosis not present

## 2019-08-09 DIAGNOSIS — W06XXXA Fall from bed, initial encounter: Secondary | ICD-10-CM | POA: Diagnosis not present

## 2019-08-09 DIAGNOSIS — Z20828 Contact with and (suspected) exposure to other viral communicable diseases: Secondary | ICD-10-CM | POA: Diagnosis present

## 2019-08-09 DIAGNOSIS — R296 Repeated falls: Secondary | ICD-10-CM | POA: Diagnosis present

## 2019-08-09 DIAGNOSIS — H5500 Unspecified nystagmus: Secondary | ICD-10-CM | POA: Diagnosis present

## 2019-08-09 DIAGNOSIS — N179 Acute kidney failure, unspecified: Secondary | ICD-10-CM | POA: Diagnosis present

## 2019-08-09 DIAGNOSIS — R2681 Unsteadiness on feet: Secondary | ICD-10-CM | POA: Diagnosis present

## 2019-08-09 DIAGNOSIS — Z6829 Body mass index (BMI) 29.0-29.9, adult: Secondary | ICD-10-CM | POA: Diagnosis not present

## 2019-08-09 DIAGNOSIS — F17210 Nicotine dependence, cigarettes, uncomplicated: Secondary | ICD-10-CM | POA: Diagnosis present

## 2019-08-09 DIAGNOSIS — E663 Overweight: Secondary | ICD-10-CM | POA: Diagnosis present

## 2019-08-09 DIAGNOSIS — Z79899 Other long term (current) drug therapy: Secondary | ICD-10-CM | POA: Diagnosis not present

## 2019-08-09 DIAGNOSIS — G40909 Epilepsy, unspecified, not intractable, without status epilepticus: Secondary | ICD-10-CM | POA: Diagnosis not present

## 2019-08-09 DIAGNOSIS — F209 Schizophrenia, unspecified: Secondary | ICD-10-CM | POA: Diagnosis present

## 2019-08-09 LAB — CBC
HCT: 41.6 % (ref 39.0–52.0)
Hemoglobin: 14.5 g/dL (ref 13.0–17.0)
MCH: 30.5 pg (ref 26.0–34.0)
MCHC: 34.9 g/dL (ref 30.0–36.0)
MCV: 87.6 fL (ref 80.0–100.0)
Platelets: 178 10*3/uL (ref 150–400)
RBC: 4.75 MIL/uL (ref 4.22–5.81)
RDW: 12.1 % (ref 11.5–15.5)
WBC: 7.9 10*3/uL (ref 4.0–10.5)
nRBC: 0 % (ref 0.0–0.2)

## 2019-08-09 LAB — BASIC METABOLIC PANEL
Anion gap: 12 (ref 5–15)
BUN: 7 mg/dL (ref 6–20)
CO2: 21 mmol/L — ABNORMAL LOW (ref 22–32)
Calcium: 9.2 mg/dL (ref 8.9–10.3)
Chloride: 106 mmol/L (ref 98–111)
Creatinine, Ser: 0.93 mg/dL (ref 0.61–1.24)
GFR calc Af Amer: >60 mL/min (ref >60)
GFR calc non Af Amer: >60 mL/min (ref >60)
Glucose, Bld: 88 mg/dL (ref 70–99)
Potassium: 3.7 mmol/L (ref 3.5–5.1)
Sodium: 139 mmol/L (ref 135–145)

## 2019-08-09 LAB — MAGNESIUM: Magnesium: 2 mg/dL (ref 1.7–2.4)

## 2019-08-09 MED ORDER — NICOTINE 14 MG/24HR TD PT24
14.0000 mg | MEDICATED_PATCH | Freq: Every day | TRANSDERMAL | Status: DC
  Administered 2019-08-09 – 2019-08-10 (×2): 14 mg via TRANSDERMAL
  Filled 2019-08-09 (×2): qty 1

## 2019-08-09 MED ORDER — AMOXICILLIN-POT CLAVULANATE 875-125 MG PO TABS
1.0000 | ORAL_TABLET | Freq: Two times a day (BID) | ORAL | Status: DC
  Administered 2019-08-09 – 2019-08-10 (×2): 1 via ORAL
  Filled 2019-08-09 (×2): qty 1

## 2019-08-09 NOTE — Consult Note (Signed)
Daniel Craig, Daniel Craig [id number] January 22, 1991 Otila Back, MD  Reason for Consult: Sinusitis  HPI: 28 year old male with a history of seizure disorder admitted for same.  On MRI and CT scan noted to have right-sided pansinusitis.  No history of sinus sinus disease in the past review of old CT and MRI scan which have been many showed no evidence of sinusitis at that point he has been complaining of right-sided headaches over the last week.  Allergies:  Allergies  Allergen Reactions  . Keppra [Levetiracetam]   . Trileptal [Oxcarbazepine]   . Vimpat [Lacosamide]     ROS: Review of systems normal other than 12 systems except per HPI.  PMH:  Past Medical History:  Diagnosis Date  . Schizophrenia ([place])   . Seizures ([place])     FH:  Family History  Family history unknown: Yes    SH:  Social History   Socioeconomic History  . Marital status: Single    Spouse name: Not on file  . Number of children: Not on file  . Years of education: Not on file  . Highest education level: Not on file  Occupational History  . Not on file  Social Needs  . Financial resource strain: Not on file  . Food insecurity    Worry: Not on file    Inability: Not on file  . Transportation needs    Medical: Not on file    Non-medical: Not on file  Tobacco Use  . Smoking status: Current Every Day Smoker    Packs/day: 1.00    Types: Cigarettes  . Smokeless tobacco: Never Used  Substance and Sexual Activity  . Alcohol use: Yes    Alcohol/week: 4.0 standard drinks    Types: 4 Shots of liquor per week    Comment: pt states 4 shots of liqour oer week  . Drug use: Yes    Types: Marijuana  . Sexual activity: Not on file  Lifestyle  . Physical activity    Days per week: Not on file    Minutes per session: Not on file  . Stress: Not on file  Relationships  . Social Herbalist on phone: Not on file    Gets together: Not on file    Attends religious service: Not on file    Active member of club or  organization: Not on file    Attends meetings of clubs or organizations: Not on file    Relationship status: Not on file  . Intimate partner violence    Fear of current or ex partner: Not on file    Emotionally abused: Not on file    Physically abused: Not on file    Forced sexual activity: Not on file  Other Topics Concern  . Not on file  Social History Narrative  . Not on file    PSH:  Past Surgical History:  Procedure Laterality Date  . MOUTH SURGERY      Physical  Exam: CN 2-12 grossly intact and symmetric. EAC/TMs normal BL. Oral cavity, lips, gums, ororpharynx normal with no masses or lesions. Skin warm and dry. Nasal cavity without polyps or purulence. External nose and ears without masses or lesions. EOMI, PERRLA. Neck supple with no masses or lesions. No lymphadenopathy palpated. Thyroid normal with no masses.   Review of CT and MRI shows apparent acute sinusitis on the right  A/P: Acute sinusitis-this can easily be treated as an outpatient recommend Augmentin 875 1 p.o. twice daily for 2 weeks I  will follow-up with him as an outpatient in the office at that time to try to schedule that here while in the hospital to have any further questions feel free to call son Daniel Craig. Daniel Craig.   Daniel Craig Daniel Craig 08/09/2019 2:18 PM

## 2019-08-09 NOTE — Progress Notes (Addendum)
[facility] [place] NAME: Daniel Craig    MR#:  [id number]  DATE OF BIRTH:  Jul 09, 1991  SUBJECTIVE:  CHIEF COMPLAINT:   Chief Complaint  Patient presents with   Seizures   Patient denies any complaints this morning.  Based on physical therapy evaluation yesterday patient was very unsteady on his gait and not safe for discharge home yet.  Physical therapy to reevaluate patient today.  Seen by neurologist this morning.  REVIEW OF SYSTEMS:  Review of Systems  Constitutional: Negative for chills and fever.  HENT: Negative for hearing loss and tinnitus.   Eyes: Negative for blurred vision and double vision.  Respiratory: Negative for cough and shortness of breath.   Cardiovascular: Negative for chest pain and palpitations.  Gastrointestinal: Negative for heartburn, nausea and vomiting.  Genitourinary: Negative for dysuria and urgency.  Musculoskeletal: Negative for myalgias.       Fall last night  Skin: Negative for itching and rash.  Neurological:       Admitted with seizures  Psychiatric/Behavioral: Negative for depression and hallucinations.    DRUG ALLERGIES:   Allergies  Allergen Reactions   Keppra [Levetiracetam]    Trileptal [Oxcarbazepine]    Vimpat [Lacosamide]    VITALS:  Blood pressure 139/74, pulse 95, temperature 98.4 F (36.9 C), temperature source Oral, resp. rate 20, height 6' 0.01" (1.829 m), weight 94.4 kg, SpO2 100 %. PHYSICAL EXAMINATION:   Physical Exam  Constitutional: He is oriented to person, place, and time. He appears well-developed and well-nourished.  HENT:  Head: Normocephalic and atraumatic.  Right Ear: External ear normal.  Positive for mostly horizontal nystagmus  Eyes: Pupils are equal, round, and reactive to light. Conjunctivae are normal. Right eye exhibits no discharge.  Neck: Normal range of motion. Neck supple. No thyromegaly present.  Cardiovascular: Normal rate, regular rhythm and  normal heart sounds.  Respiratory: Effort normal and breath sounds normal.  GI: Soft. Bowel sounds are normal. He exhibits no distension.  Musculoskeletal: Normal range of motion.        General: No edema.  Neurological: He is alert and oriented to person, place, and time. No cranial nerve deficit.  Skin: Skin is warm. He is not diaphoretic. No erythema.  Psychiatric: He has a normal mood and affect. His behavior is normal.   LABORATORY PANEL:  Male CBC Recent Labs  Lab 08/09/19 0512  WBC 7.9  HGB 14.5  HCT 41.6  PLT 178   ------------------------------------------------------------------------------------------------------------------ Chemistries  Recent Labs  Lab 08/06/19 1601  08/09/19 0512  NA 133*   < > 139  K 4.0   < > 3.7  CL 99   < > 106  CO2 22   < > 21*  GLUCOSE 125*   < > 88  BUN 11   < > 7  CREATININE 1.27*   < > 0.93  CALCIUM 9.6   < > 9.2  MG  --   --  2.0  AST 28  --   --   ALT 11  --   --   ALKPHOS 41  --   --   BILITOT 0.9  --   --    < > = values in this interval not displayed.   RADIOLOGY:  Mr Brain 76 Contrast  Result Date: 08/08/2019 CLINICAL DATA:  28 year old male with recent seizures and falls. EXAM: MRI HEAD WITHOUT CONTRAST TECHNIQUE: Multiplanar, multiecho pulse sequences of the brain and surrounding structures were  obtained without intravenous contrast. COMPARISON:  Head CT earlier today, and earlier. FINDINGS: The examination had to be discontinued prior to completion due to patient confusion. No sagittal T1 weighted imaging could be obtained and other sequences are intermittently degraded by motion artifact. Brain: Cerebral volume is within normal limits. No restricted diffusion to suggest acute infarction. No midline shift, mass effect, evidence of mass lesion, ventriculomegaly, extra-axial collection or acute intracranial hemorrhage. Cervicomedullary junction and pituitary are within normal limits. No definite hippocampal formation  asymmetry. Wallace CullensGray and white matter signal is within normal limits throughout the brain. No encephalomalacia or chronic blood products identified. Vascular: Major intracranial vascular flow voids are preserved. Skull and upper cervical spine: Not well evaluated in the absence of sagittal T1 imaging. Sinuses/Orbits: Negative orbits. Right OMC obstructive pattern of paranasal sinus disease again noted. Other: Mastoids are clear. Negative visible scalp and face soft tissues. IMPRESSION: 1. Negative mildly motion degraded non-contrast MRI appearance of the brain. 2. Right OMC obstructive pattern of paranasal sinus disease. Electronically Signed   By: Odessa FlemingH  Hall M.D.   On: 08/08/2019 19:36   ASSESSMENT AND PLAN:   This is a 28 year old male admitted for epileptic seizures. 1. Epilepsy:  Patient with history of seizures who presented with breakthrough seizures.  Patient was loaded with Dilantin 2 g at the ED on presentation.  Having some nystagmus. CT head without contrast done with no acute intracranial findings apart from right paranasal sinus disease  Seen by neurologist.  Had MRI of the brain done which revealed negative mildly motion degraded non-contrast MRI appearance of the brain. Right OMC obstructive pattern of paranasal sinus disease.  Requested for ENT input Patient seen by physical therapist yesterday. Was very unsteady on his gait and not safe for discharge yet.  Physical therapy to continue working with patient. Continue seizure precautions. Continue ethosuximide as well as Depakote.   Neurologist changed dose of Lamictal 200 mg every morning.  Continue Lamictal at 200 mg nightly. Patient reevaluated by neurologist this morning.  Recommended discharge once safe PT. Patient is still not steady on his gait and not safe for discharge home since he lives alone and he cannot go live with his dad. Case manager to work on placement  2. Acute kidney injury:  Resolved with IV fluids.    3.  Overweight: BMI is 29.5; encouraged healthy diet and exercise Tobacco abuse.  Smoking cessation was counseled for 3 to 4 minutes. Marijuana abuse.  Counseled.  DVT prophylaxis; Lovenox   All the records are reviewed and case discussed with Care Management/Social Worker. Management plans discussed with the patient, family and they are in agreement. I called patient's daughter listed in the chart Mr. Requena, explain patient's current clinical condition including being unsteady on his gait.  Father stated he cannot take patient home because she cannot care for patient.  Case manager to explore skilled nursing facility placement.  CODE STATUS: Full Code  TOTAL TIME TAKING CARE OF THIS PATIENT: 35 minutes.   More than 50% of the time was spent in counseling/coordination of care: YES  POSSIBLE D/C IN 1-2 DAYS, DEPENDING ON CLINICAL CONDITION.   Jarmar Rousseau M.D on 08/09/2019 at 10:51 AM  Between 7am to 6pm - Pager - [phone]  After 6pm go to [email or web address] - Social research officer, governmentpassword [email or web address] [email or web address]  [facility]  Office  [phone]  CC: Primary care physician; [employer]  Note: This dictation was prepared with Dragon dictation along with smaller phrase technology. Any transcriptional errors  that result from this process are unintentional.

## 2019-08-09 NOTE — Progress Notes (Signed)
Subjective: Patient more stable today but not at baseline. No seizures noted.     Objective: Current vital signs: BP 139/74 (BP Location: Right Arm)   Pulse 95   Temp 98.4 F (36.9 C) (Oral)   Resp 20   Ht 6' 0.01" (1.829 m)   Wt 94.4 kg   SpO2 100%   BMI 28.22 kg/m  Vital signs in last 24 hours: Temp:  [98.4 F (36.9 C)-99.1 F (37.3 C)] 98.4 F (36.9 C) (09/13 0950) Pulse Rate:  [57-95] 95 (09/13 0950) Resp:  [18-20] 20 (09/13 0950) BP: (110-139)/(72-81) 139/74 (09/13 0950) SpO2:  [100 %] 100 % (09/13 0950) Weight:  [94.4 kg] 94.4 kg (09/13 0547)  Intake/Output from previous day: 09/12 0701 - 09/13 0700 In: 0  Out: 400 [Urine:400] Intake/Output this shift: No intake/output data recorded. Nutritional status:  Diet Order            Diet regular Room service appropriate? Yes; Fluid consistency: Thin  Diet effective now        Diet - low sodium heart healthy              Neurologic Exam: Mental Status: Alert, oriented, thought content appropriate.  Speech fluent without evidence of aphasia.  Able to follow 3 step commands without difficulty. Cranial Nerves: II: Discs flat bilaterally; Visual fields grossly normal, pupils equal, round, reactive to light and accommodation III,IV, VI: ptosis not present, extra-ocular motions intact bilaterally.  Continued nystagmus V,VII: smile symmetric, facial light touch sensation normal bilaterally VIII: hearing normal bilaterally IX,X: gag reflex present XI: bilateral shoulder shrug XII: midline tongue extension Motor: 5/5 throughout Sensory: Pinprick and light touch intact throughout, bilaterally Gait: Mildly ataxic   Lab Results: Basic Metabolic Panel: Recent Labs  Lab 08/06/19 1601 08/08/19 0709 08/09/19 0512  NA 133* 139 139  K 4.0 4.2 3.7  CL 99 103 106  CO2 22 21* 21*  GLUCOSE 125* 85 88  BUN 11 7 7   CREATININE 1.27* 0.97 0.93  CALCIUM 9.6 9.2 9.2  MG  --   --  2.0    Liver Function Tests: Recent  Labs  Lab 08/06/19 1601  AST 28  ALT 11  ALKPHOS 41  BILITOT 0.9  PROT 7.8  ALBUMIN 4.3   No results for input(s): LIPASE, AMYLASE in the last 168 hours. No results for input(s): AMMONIA in the last 168 hours.  CBC: Recent Labs  Lab 08/06/19 1601 08/08/19 0828 08/09/19 0512  WBC 17.4* 9.6 7.9  NEUTROABS 12.9*  --   --   HGB 14.4 13.7 14.5  HCT 41.8 39.2 41.6  MCV 88.7 87.5 87.6  PLT 187 173 178    Cardiac Enzymes: No results for input(s): CKTOTAL, CKMB, CKMBINDEX, TROPONINI in the last 168 hours.  Lipid Panel: No results for input(s): CHOL, TRIG, HDL, CHOLHDL, VLDL, LDLCALC in the last 168 hours.  CBG: Recent Labs  Lab 08/06/19 2230  GLUCAP 94    Microbiology: Results for orders placed or performed during the hospital encounter of 08/06/19  SARS Coronavirus 2 [facility] order, Performed in [facility] hospital lab) Nasopharyngeal Nasopharyngeal Swab     Status: None   Collection Time: 08/06/19  6:04 PM   Specimen: Nasopharyngeal Swab  Result Value Ref Range Status   SARS Coronavirus 2 NEGATIVE NEGATIVE Final    Comment: (NOTE) If result is NEGATIVE SARS-CoV-2 target nucleic acids are NOT DETECTED. The SARS-CoV-2 RNA is generally detectable in upper and lower  respiratory specimens during the acute  phase of infection. The lowest  concentration of SARS-CoV-2 viral copies this assay can detect is 250  copies / mL. A negative result does not preclude SARS-CoV-2 infection  and should not be used as the sole basis for treatment or other  patient management decisions.  A negative result may occur with  improper specimen collection / handling, submission of specimen other  than nasopharyngeal swab, presence of viral mutation(s) within the  areas targeted by this assay, and inadequate number of viral copies  (<250 copies / mL). A negative result must be combined with clinical  observations, patient history, and epidemiological information. If result is  POSITIVE SARS-CoV-2 target nucleic acids are DETECTED. The SARS-CoV-2 RNA is generally detectable in upper and lower  respiratory specimens dur ing the acute phase of infection.  Positive  results are indicative of active infection with SARS-CoV-2.  Clinical  correlation with patient history and other diagnostic information is  necessary to determine patient infection status.  Positive results do  not rule out bacterial infection or co-infection with other viruses. If result is PRESUMPTIVE POSTIVE SARS-CoV-2 nucleic acids MAY BE PRESENT.   A presumptive positive result was obtained on the submitted specimen  and confirmed on repeat testing.  While 2019 novel coronavirus  (SARS-CoV-2) nucleic acids may be present in the submitted sample  additional confirmatory testing may be necessary for epidemiological  and / or clinical management purposes  to differentiate between  SARS-CoV-2 and other Sarbecovirus currently known to infect humans.  If clinically indicated additional testing with an alternate test  methodology [phone]([id number]) is advised. The SARS-CoV-2 RNA is generally  detectable in upper and lower respiratory sp ecimens during the acute  phase of infection. The expected result is Negative. Fact Sheet for Patients:  BoilerBrush.com.cyhttps://www.fda.gov/media/136312/download Fact Sheet for Healthcare Providers: https://pope.com/https://www.fda.gov/media/136313/download This test is not yet approved or cleared by the [place] FDA and has been authorized for detection and/or diagnosis of SARS-CoV-2 by FDA under an Emergency Use Authorization (EUA).  This EUA will remain in effect (meaning this test can be used) for the duration of the COVID-19 declaration under Section 564(b)(1) of the Act, 21 U.S.C. section 360bbb-3(b)(1), unless the authorization is terminated or revoked sooner. Performed at [facility], [address] [place], [place] [address]     Coagulation Studies: No results for input(s):  LABPROT, INR in the last 72 hours.  Imaging: Ct Head Wo Contrast  Result Date: 08/08/2019 CLINICAL DATA:  Unwitnessed fall. EXAM: CT HEAD WITHOUT CONTRAST TECHNIQUE: Contiguous axial images were obtained from the base of the skull through the vertex without intravenous contrast. COMPARISON:  08/06/2019 FINDINGS: Brain: There is no evidence for acute hemorrhage, hydrocephalus, mass lesion, or abnormal extra-axial fluid collection. No definite CT evidence for acute infarction. Vascular: No hyperdense vessel or unexpected calcification. Skull: No evidence for fracture. No worrisome lytic or sclerotic lesion. Sinuses/Orbits: Opacification noted right maxillary sinus, right anterior ethmoid air cells, and right frontal sinus. Visualized portions of the globes and intraorbital fat are unremarkable. Other: None. IMPRESSION: 1. No acute intracranial abnormality. 2. 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 Electronically Signed   By: Kennith CenterEric  Mansell M.D.   On: 08/08/2019 10:28   Mr Brain Wo Contrast  Result Date: 08/08/2019 CLINICAL DATA:  28 year old male with recent seizures and falls. EXAM: MRI HEAD WITHOUT CONTRAST TECHNIQUE: Multiplanar, multiecho pulse sequences of the brain and surrounding structures were obtained without intravenous contrast. COMPARISON:  Head CT earlier today, and earlier.  FINDINGS: The examination had to be discontinued prior to completion due to patient confusion. No sagittal T1 weighted imaging could be obtained and other sequences are intermittently degraded by motion artifact. Brain: Cerebral volume is within normal limits. No restricted diffusion to suggest acute infarction. No midline shift, mass effect, evidence of mass lesion, ventriculomegaly, extra-axial collection or acute intracranial hemorrhage. Cervicomedullary junction and pituitary are within normal limits. No definite hippocampal formation asymmetry. Pearline Cables and white matter  signal is within normal limits throughout the brain. No encephalomalacia or chronic blood products identified. Vascular: Major intracranial vascular flow voids are preserved. Skull and upper cervical spine: Not well evaluated in the absence of sagittal T1 imaging. Sinuses/Orbits: Negative orbits. Right OMC obstructive pattern of paranasal sinus disease again noted. Other: Mastoids are clear. Negative visible scalp and face soft tissues. IMPRESSION: 1. Negative mildly motion degraded non-contrast MRI appearance of the brain. 2. Right OMC obstructive pattern of paranasal sinus disease. Electronically Signed   By: Genevie Ann M.D.   On: 08/08/2019 19:36    Medications:  I have reviewed the patient's current medications. Scheduled: . divalproex  500 mg Oral q morning - 10a   And  . divalproex  1,000 mg Oral QHS  . docusate sodium  100 mg Oral BID  . enoxaparin (LOVENOX) injection  40 mg Subcutaneous Q24H  . ethosuximide  250 mg Oral BID  . lamoTRIgine  100 mg Oral q morning - 10a   And  . lamoTRIgine  200 mg Oral QHS  . levothyroxine  50 mcg Oral QAC breakfast  . OLANZapine  10 mg Oral q morning - 10a   And  . OLANZapine  20 mg Oral QHS  . propranolol ER  60 mg Oral Daily  . risperiDONE  6 mg Oral QHS  . traZODone  150 mg Oral QHS  . vitamin C  1,000 mg Oral Daily  . Vitamin D (Ergocalciferol)  50,000 Units Oral Q7 days    Assessment/Plan: Ataxia improving.  MRI of the brain unremarkable. Dilantin level this AM 13.7 despite not getting Dilantin since 9/11 but given a 2000mg  load.    Recommendations: 1. Continue on home medications 2. Patient may be discharged once safe per PT     LOS: 0 days   Alexis Goodell, MD Neurology [phone] 08/09/2019  9:59 AM

## 2019-08-09 NOTE — TOC Progression Note (Signed)
Transition of Care [facility]) - Progression Note    Patient Details  Name: Daniel Craig MRN: [id number] Date of Birth: [birth year]  [facility] CM/SW Contact  Latanya Maudlin, RN Phone Number: 08/09/2019, 12:27 PM  Clinical Narrative:  TOC consulted to assist with disposition. Patient is from home alone and his father who is also legal guardian is able to check on him. Per PT patient needs home health with supervision 24 hours a day. MD has concerns over safety since he is very unsteady and has fallen here in the hospital. Im not sure how appropriate SNF but MD would like [place] to look into it. FL2 done, pt may need updated PASSAR for level 2. MD is contacting patients father to see if he can stay with him.     Expected Discharge Plan: [place] Barriers to Discharge: Continued Medical Work up  Expected Discharge Plan and Services Expected Discharge Plan: [place], [place] Living arrangements for the past 2 months: Apartment Expected Discharge Date: 08/07/19                                     Social Determinants of Health (SDOH) Interventions    Readmission Risk Interventions No flowsheet data found.

## 2019-08-09 NOTE — NC FL2 (Signed)
[place] LEVEL OF CARE SCREENING TOOL     IDENTIFICATION  Patient Name: Daniel Craig Birthdate: [birth year] Sex: male Admission Date (Current Location): 08/06/2019  [place] and [place] Number:  Daniel Craig [id number] [place] and Address:  [facility], [address], [place], [place] [address]      Provider Number: [id number]  Attending Physician Name and Address:  Otila Back, MD  Relative Name and Phone Number:  Pardeep, [place] Father [phone]  [phone]    Current Level of Care: Hospital Recommended Level of Care: [place] Prior Approval Number:    Date Approved/Denied:   PASRR Number: [id number]  Discharge Plan: SNF    Current Diagnoses: Patient Active Problem List   Diagnosis Date Noted  . Epilepsy ([place]) 08/07/2019  . Extremely severe auditory hallucinations 06/02/2019  . Hypoglycemia 11/09/2018  . Schizophrenia, paranoid ([place]) 02/27/2018  . Cannabis use disorder, moderate, dependence ([place]) 02/27/2018  . Tobacco use disorder 02/27/2018  . 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 Altered mental status 12/28/2017  . Fever 08/11/2017  . Acute URI 08/11/2017  . Delirium 06/25/2017  . Schizophrenia ([place]) 06/25/2017  . Seizures ([place]) 06/25/2017    Orientation RESPIRATION BLADDER Height & Weight     Self, Time, Situation, Place  Normal Continent Weight: 94.4 kg Height:  6' 0.01" (182.9 cm)  BEHAVIORAL SYMPTOMS/MOOD NEUROLOGICAL BOWEL NUTRITION STATUS      Continent Diet  AMBULATORY STATUS COMMUNICATION OF NEEDS Skin   Supervision   Normal                       Personal Care Assistance Level of Assistance  Bathing, Feeding, Total care, Dressing Bathing Assistance: Limited assistance Feeding assistance: Independent Dressing Assistance: Limited assistance Total Care Assistance: Limited assistance   Functional Limitations Info  Sight, Hearing, Speech Sight Info: Adequate Hearing Info:  Adequate Speech Info: Adequate    SPECIAL CARE FACTORS FREQUENCY  PT (By licensed PT), OT (By licensed OT)     PT Frequency: 5x per week OT Frequency: 5x per week            Contractures Contractures Info: Not present    Additional Factors Info  Code Status, Allergies Code Status Info: full code Allergies Info: kepra, vimpat, trileptal           Current Medications (08/09/2019):  This is the current hospital active medication list Current Facility-Administered Medications  Medication Dose Route Frequency Provider Last Rate Last Dose  . acetaminophen (TYLENOL) tablet 650 mg  650 mg Oral Q6H PRN Harrie Foreman, MD   650 mg at 08/07/19 0737   Or  . acetaminophen (TYLENOL) suppository 650 mg  650 mg Rectal Q6H PRN Harrie Foreman, MD      . divalproex (DEPAKOTE ER) 24 hr tablet 500 mg  500 mg Oral q morning - 10a Harrie Foreman, MD   500 mg at 08/09/19 1062   And  . divalproex (DEPAKOTE ER) 24 hr tablet 1,000 mg  1,000 mg Oral QHS Harrie Foreman, MD   1,000 mg at 08/08/19 2128  . docusate sodium (COLACE) capsule 100 mg  100 mg Oral BID Harrie Foreman, MD   100 mg at 08/09/19 0953  . enoxaparin (LOVENOX) injection 40 mg  40 mg Subcutaneous Q24H Demetrios Loll, MD   40 mg at 08/08/19 2129  . ethosuximide (ZARONTIN) capsule 250 mg  250 mg Oral BID Harrie Foreman, MD   250 mg at 08/09/19 0954  .  lamoTRIgine (LAMICTAL) tablet 100 mg  100 mg Oral q morning - 10a Thana Farreynolds, Leslie, MD   100 mg at 08/09/19 [id number]   And  . lamoTRIgine (LAMICTAL) tablet 200 mg  200 mg Oral QHS Thana Farreynolds, Leslie, MD   200 mg at 08/08/19 2128  . levothyroxine (SYNTHROID) tablet 50 mcg  50 mcg Oral QAC breakfast Arnaldo Nataliamond, Michael S, MD   50 mcg at 08/09/19 [phone]  . LORazepam (ATIVAN) tablet 1 mg  1 mg Oral BID PRN Arnaldo Nataliamond, Michael S, MD      . nicotine (NICODERM CQ - dosed in mg/24 hours) patch 14 mg  14 mg Transdermal Daily Ojie, Jude, MD      . OLANZapine (ZYPREXA) tablet 10 mg  10 mg Oral q  morning - 10a Arnaldo Nataliamond, Michael S, MD   10 mg at 08/09/19 [id number]   And  . OLANZapine (ZYPREXA) tablet 20 mg  20 mg Oral QHS Arnaldo Nataliamond, Michael S, MD   20 mg at 08/08/19 2128  . ondansetron (ZOFRAN) tablet 4 mg  4 mg Oral Q6H PRN Arnaldo Nataliamond, Michael S, MD       Or  . ondansetron [facility](ZOFRAN) injection 4 mg  4 mg Intravenous Q6H PRN Arnaldo Nataliamond, Michael S, MD      . propranolol ER (INDERAL LA) 24 hr capsule 60 mg  60 mg Oral Daily Arnaldo Nataliamond, Michael S, MD   60 mg at 08/09/19 0954  . risperiDONE (RISPERDAL) tablet 6 mg  6 mg Oral QHS Arnaldo Nataliamond, Michael S, MD   6 mg at 08/08/19 2128  . traZODone (DESYREL) tablet 150 mg  150 mg Oral QHS Arnaldo Nataliamond, Michael S, MD   150 mg at 08/08/19 2128  . vitamin C (ASCORBIC ACID) tablet 1,000 mg  1,000 mg Oral Daily Arnaldo Nataliamond, Michael S, MD   1,000 mg at 08/09/19 0953  . Vitamin D (Ergocalciferol) (DRISDOL) capsule 50,000 Units  50,000 Units Oral Q7 days Arnaldo Nataliamond, Michael S, MD   50,000 Units at 08/09/19 [id number]     Discharge Medications: Please see discharge summary for a list of discharge medications.  Relevant Imaging Results:  Relevant Lab Results:   Additional Information ss [id number]  Virgel ManifoldJosh A Stephenson Cichy, RN

## 2019-08-10 LAB — LAMOTRIGINE LEVEL: Lamotrigine Lvl: 7.6 ug/mL (ref 2.0–20.0)

## 2019-08-10 MED ORDER — LAMOTRIGINE 200 MG PO TABS: 200.0000 mg | ORAL_TABLET | Freq: Every day | ORAL | 0 refills | Status: AC

## 2019-08-10 MED ORDER — LAMOTRIGINE 100 MG PO TABS: 100.0000 mg | ORAL_TABLET | Freq: Every morning | ORAL | 0 refills | Status: AC

## 2019-08-10 MED ORDER — AMOXICILLIN-POT CLAVULANATE 875-125 MG PO TABS: 1.0000 | ORAL_TABLET | Freq: Two times a day (BID) | ORAL | 0 refills | Status: AC

## 2019-08-10 MED ORDER — NICOTINE 14 MG/24HR TD PT24: 14.0000 mg | MEDICATED_PATCH | Freq: Every day | TRANSDERMAL | 0 refills | Status: DC

## 2019-08-10 NOTE — TOC Progression Note (Signed)
Transition of Care [facility]) - Progression Note    Patient Details  Name: Daniel Craig MRN: [id number] Date of Birth: 1991/10/26  [facility]) CM/SW Contact  Shelbie Hutching, RN Phone Number: 08/10/2019, 11:56 AM  Clinical Narrative:    Patient's case worker is with [employer] agency contracted out by [employer], her name is Hurshel Keys number [phone].    Expected Discharge Plan: [place] Barriers to Discharge: Continued Medical Work up  Expected Discharge Plan and Services Expected Discharge Plan: [place], [place] Living arrangements for the past 2 months: Apartment Expected Discharge Date: 08/07/19                                     Social Determinants of Health (SDOH) Interventions    Readmission Risk Interventions No flowsheet data found.

## 2019-08-10 NOTE — TOC Progression Note (Signed)
Transition of Care [facility]) - Progression Note    Patient Details  Name: Daniel Craig MRN: [id number] Date of Birth: December 09, 1990  Transition of Care [facility]) CM/SW Contact  Shelbie Hutching, RN Phone Number: 08/10/2019, 2:19 PM  Clinical Narrative:    Father at the bedside visiting with son.  Patient refuses group home placement.  No bed offers for rehab at this time.  RNCM waiting on PT re evaluation.    Expected Discharge Plan: [place] Barriers to Discharge: Continued Medical Work up  Expected Discharge Plan and Services Expected Discharge Plan: [place], [place] Living arrangements for the past 2 months: Apartment Expected Discharge Date: 08/07/19                                     Social Determinants of Health (SDOH) Interventions    Readmission Risk Interventions No flowsheet data found.

## 2019-08-10 NOTE — TOC Transition Note (Signed)
Transition of Care [facility]) - CM/SW Discharge Note   Patient Details  Name: Daniel Craig MRN: [id number] Date of Birth: 1990/12/08  Transition of Care [facility]) CM/SW Contact:  Shelbie Hutching, RN Phone Number: 08/10/2019, 4:22 PM   Clinical Narrative:    Patient will be able to discharge home today.  Patient's father agreed to discharge and understands PT recommendations.  Home Health arranged with [facility] for RN and PT, they can start later this week.  Patient's father will provide transportation for patient home.  [place] [facility] will follow up with patient this afternoon and tomorrow.   Alvis Lemmings will call to arranged time to come out with patient.    Final next level of care: Home w Home Health Services Barriers to Discharge: Barriers Resolved   Patient Goals and CMS Choice   CMS Medicare.gov Compare Post Acute Care list provided to:: Patient Choice offered to / list presented to : Patient  Discharge Placement                       Discharge Plan and Services     Post Acute Care Choice: [place]                    HH Arranged: RN, PT [facility] Agency: [place] Date [facility] Agency Contacted: 08/10/19 Time [place]: 1622 Representative spoke with at [place]: [place] ([place]) Interventions     Readmission Risk Interventions No flowsheet data found.

## 2019-08-10 NOTE — Progress Notes (Addendum)
[facility] - [place] at [facility]   PATIENT NAME: Daniel Craig    MR#:  [id number]  DATE OF BIRTH:  Apr 01, 1991  SUBJECTIVE:  CHIEF COMPLAINT:   Chief Complaint  Patient presents with   Seizures   Patient denies any complaints this morning.  Based on physical therapy evaluation recently patient was very unsteady on his gait and not safe for discharge home without 24-hour supervision and patient lives alone.Marland Kitchen.  Physical therapy to reevaluate patient today.   REVIEW OF SYSTEMS:  Review of Systems  Constitutional: Negative for chills and fever.  HENT: Negative for hearing loss and tinnitus.   Eyes: Negative for blurred vision and double vision.  Respiratory: Negative for cough and shortness of breath.   Cardiovascular: Negative for chest pain and palpitations.  Gastrointestinal: Negative for heartburn, nausea and vomiting.  Genitourinary: Negative for dysuria and urgency.  Musculoskeletal: Negative for myalgias.       Fall last night  Skin: Negative for itching and rash.  Neurological:       Admitted with seizures  Psychiatric/Behavioral: Negative for depression and hallucinations.    DRUG ALLERGIES:   Allergies  Allergen Reactions   Keppra [Levetiracetam]    Trileptal [Oxcarbazepine]    Vimpat [Lacosamide]    VITALS:  Blood pressure (!) 130/91, pulse 91, temperature 98.6 F (37 C), temperature source Oral, resp. rate 18, height 6' 0.01" (1.829 m), weight 100.2 kg, SpO2 100 %. PHYSICAL EXAMINATION:   Physical Exam  Constitutional: He is oriented to person, place, and time. He appears well-developed and well-nourished.  HENT:  Head: Normocephalic and atraumatic.  Right Ear: External ear normal.  Eyes: Pupils are equal, round, and reactive to light. Conjunctivae are normal. Right eye exhibits no discharge.  Neck: Normal range of motion. Neck supple. No thyromegaly present.  Cardiovascular: Normal rate, regular rhythm and normal heart sounds.    Respiratory: Effort normal and breath sounds normal.  GI: Soft. Bowel sounds are normal. He exhibits no distension.  Musculoskeletal: Normal range of motion.        General: No edema.  Neurological: He is alert and oriented to person, place, and time. No cranial nerve deficit.  Skin: Skin is warm. He is not diaphoretic. No erythema.  Psychiatric: He has a normal mood and affect. His behavior is normal.   LABORATORY PANEL:  Male CBC Recent Labs  Lab 08/09/19 0512  WBC 7.9  HGB 14.5  HCT 41.6  PLT 178   ------------------------------------------------------------------------------------------------------------------ Chemistries  Recent Labs  Lab 08/06/19 1601  08/09/19 0512  NA 133*   < > 139  K 4.0   < > 3.7  CL 99   < > 106  CO2 22   < > 21*  GLUCOSE 125*   < > 88  BUN 11   < > 7  CREATININE 1.27*   < > 0.93  CALCIUM 9.6   < > 9.2  MG  --   --  2.0  AST 28  --   --   ALT 11  --   --   ALKPHOS 41  --   --   BILITOT 0.9  --   --    < > = values in this interval not displayed.   RADIOLOGY:  Ct Cervical Spine Wo Contrast  Result Date: 08/09/2019 CLINICAL DATA:  Fall last night.  Ataxia. EXAM: CT CERVICAL SPINE WITHOUT CONTRAST TECHNIQUE: Multidetector CT imaging of the cervical spine was performed without intravenous contrast. Multiplanar CT  image reconstructions were also generated. COMPARISON:  CT of the cervical spine without contrast 08/06/2019 and 02/06/2017 FINDINGS: Alignment: There is straightening of the normal cervical lordosis. No significant listhesis or scoliosis is present. Skull base and vertebrae: Craniocervical junction is normal. Vertebral body heights are maintained. No acute or healing fractures are present. Soft tissues and spinal canal: No prevertebral fluid or swelling. No visible canal hematoma. Disc levels: Mild endplate degenerative changes are noted with anterior osteophyte at C4-5. This may reflect remote trauma no significant disc disease or  stenosis is evident. Upper chest: Lung apices are clear.  Thoracic inlet is normal. IMPRESSION: 1. No acute trauma or significant interval changes of the cervical spine. 2. Remote trauma or degenerative change at C4-5 is stable. Electronically Signed   By: San Morelle M.D.   On: 08/09/2019 18:36   ASSESSMENT AND PLAN:   This is a 28 year old male admitted for epileptic seizures. 1. Epilepsy:  Patient with history of seizures who presented with breakthrough seizures.  Patient was loaded with Dilantin 2 g at the ED on presentation.  Having some nystagmus. CT head without contrast done with no acute intracranial findings apart from right paranasal sinus disease  Seen by neurologist.  Had MRI of the brain done which revealed negative mildly motion degraded non-contrast MRI appearance of the brain. Right [facility] obstructive pattern of paranasal sinus disease.  Patient seen by ENT physician Dr. Tami Ribas.  She recommended Augmentin x2 weeks which has been initiated for treatment of acute sinusitis.  Plan is to follow-up with ENT physician as outpatient Patient seen by physical therapist 2 days ago. Was very unsteady on his gait and not safe for discharge yet.  Physical therapy to continue working with patient. Continue seizure precautions. Continue ethosuximide as well as Depakote.   Neurologist changed dose of Lamictal 200 mg every morning.  Continue Lamictal at 200 mg nightly. Patient's that stated patient had had prior cervical spine injury in the past.  Repeat cervical spine done with no acute findings of fracture. Patient is still not steady on his gait and not safe for discharge home since he lives alone and he cannot go live with his dad.  Case manager working on skilled nursing facility placement. Follow-up on physical therapy evaluation  2. Acute kidney injury:  Resolved with IV fluids.    3. Overweight: BMI is 29.5; encouraged healthy diet and exercise Tobacco abuse.  Smoking  cessation was counseled for 3 to 4 minutes. Marijuana abuse.  Counseled.  DVT prophylaxis; Lovenox   All the records are reviewed and case discussed with Care Management/Social Worker. Management plans discussed with the patient, family and they are in agreement. I called patient's dad listed in the chart Mr. Kubisiak, explain patient's current clinical condition including being unsteady on his gait.  Father stated he cannot take patient home because she cannot care for patient.  Case manager working on skilled nursing facility placement.  CODE STATUS: Full Code  TOTAL TIME TAKING CARE OF THIS PATIENT: 33 minutes.   More than 50% of the time was spent in counseling/coordination of care: YES  POSSIBLE D/C IN 1-2 DAYS, DEPENDING ON CLINICAL CONDITION.   Stoy Fenn M.D on 08/10/2019 at 2:54 PM  Between 7am to 6pm - Pager - [phone]  After 6pm go to [email or web address] - Proofreader  [facility]  Office  [phone]  CC: Primary care physician; [place]  Note: This dictation was prepared with Dragon dictation along with smaller  Company secretary. Any transcriptional errors that result from this process are unintentional.

## 2019-08-10 NOTE — TOC Progression Note (Signed)
Transition of Care [facility]) - Progression Note    Patient Details  Name: Daniel Craig MRN: [id number] Date of Birth: 26-Mar-1991  Transition of Care [facility]) CM/SW Contact  Shelbie Hutching, RN Phone Number: 08/10/2019, 9:17 AM  Clinical Narrative:    RNCM spoke with patient's father this morning about discharge planning.  Father reports that the patient cannot come to his home because patient does not listen to him or his wife and he would not be safe there.  Patient cannot go home by himself at this time because he has fallen here at the hospital, is unsteady, and has poor safety awareness.   Patient has been in a group home before, and the patient's parent's are on board with group home placement but since the patient has been living on his own they are pretty sure he is going to be against group home placement.  Patient is followed by [name] case worker.  RNCM will do a bed search for short term rehab, patient having Medicaid will make this difficult.   RNCM will follow up with PT and neurology and continue to work on appropriate and safe discharge plan.    Expected Discharge Plan: [place] Barriers to Discharge: Continued Medical Work up  Expected Discharge Plan and Services Expected Discharge Plan: [place], [place] Living arrangements for the past 2 months: Apartment Expected Discharge Date: 08/07/19                                     Social Determinants of Health (SDOH) Interventions    Readmission Risk Interventions No flowsheet data found.

## 2019-08-10 NOTE — Discharge Summary (Signed)
[facility] [place] [facility]   PATIENT NAME: Daniel Craig    MR#:  [id number]  DATE OF BIRTH:  03-Mar-1991  DATE OF ADMISSION:  08/06/2019   ADMITTING PHYSICIAN: Arnaldo NatalMichael S Diamond, MD  DATE OF DISCHARGE: 08/10/2019  PRIMARY CARE PHYSICIAN: [facility]   ADMISSION DIAGNOSIS:  Seizure (HCC) [R56.9] DISCHARGE DIAGNOSIS:  Active Problems:   Seizures (HCC)   Epilepsy (HCC)  SECONDARY DIAGNOSIS:   Past Medical History:  Diagnosis Date  . Schizophrenia (HCC)   . Seizures [facility](HCC)    HOSPITAL COURSE:   Chief Complaint: Seizures HPI: The patient with past medical history of epilepsy presented to the emergency department immediately following discharge from the hospital after being found on the curb awaiting his taxi in a postictal state.  The patient seized Ativan 1 mg IV and  a 2 g dose of Dilantin in the emergency department.  Neurology was consulted via telephone who recommended observation overnight and increasing his dose of Lamictal in the morning.  The patient cannot contribute much history as he cannot remember if he got into his cab.  Once he was stabilized the emergency department staff called the hospitalist service for admission   Hospital course; This is a 28 year old male admitted for epileptic seizures. 1. Epilepsy:  Patient with history of seizures who presented with breakthrough seizures.  Patient was loaded with Dilantin 2 g at the ED on presentation.  Having some nystagmus. CT head without contrast done with no acute intracranial findings apart from right paranasal sinus disease . Seen by neurologist.  Had MRI of the brain done which revealed negative mildly motion degraded non-contrast MRI appearance of the brain. Right [facility] obstructive pattern of paranasal sinus disease.  Patient seen by ENT physician Dr. Jenne CampusMcqueen.  She recommended Augmentin x2 weeks which has been initiated for treatment of acute sinusitis.  Plan is to follow-up  with ENT physician as outpatient Patient seen by physical therapist 2 days ago. Was very unsteady on his gait and not safe for discharge yet.  Physical therapy to continue worked with patient today and he did much better. Ambulated independently and does not need SNF anymore. Patient will still benefit from 24hr supervision. I told the patient's dad the recommendation. Patient being discharged home with home health services.Continue ethosuximide as well as Depakote.Neurologist changed dose of Lamictal 100 mg every morning.  Continue Lamictal at 200 mg nightly. Patient's that stated patient had had prior cervical spine injury in the past.  Repeat cervical spine done with no acute findings of fracture.  2. Acute kidney injury:  Resolved with IV fluids.    3. Overweight: BMI is 29.5; encouraged healthy diet and exercise Tobacco abuse.Smoking cessation was counseled for 3 to 4 minutes. Marijuana abuse. Counseled.   DISCHARGE CONDITIONS:  Stable CONSULTS OBTAINED:  Treatment Team:  Kym Groomriadhosp, Neuro1, MD Thana Farreynolds, Leslie, MD Linus SalmonsMcQueen, Chapman, MD DRUG ALLERGIES:   Allergies  Allergen Reactions  . Keppra [Levetiracetam]   . Trileptal [Oxcarbazepine]   . Vimpat [Lacosamide]    DISCHARGE MEDICATIONS:   Allergies as of 08/10/2019      Reactions   Keppra [levetiracetam]    Trileptal [oxcarbazepine]    Vimpat [lacosamide]       Medication List    TAKE these medications   amoxicillin-clavulanate 875-125 MG tablet Commonly known as: AUGMENTIN Take 1 tablet by mouth every 12 (twelve) hours for 13 days.   divalproex 500 MG 24 hr tablet Commonly known as: DEPAKOTE ER Take  500-1,000 mg by mouth See admin instructions. 500 mg every morning and 1000 mg at bedtime   ethosuximide 250 MG capsule Commonly known as: Zarontin Take 1 capsule (250 mg total) by mouth 2 (two) times daily.   lamoTRIgine 200 MG tablet Commonly known as: LAMICTAL Take 1 tablet (200 mg total) by mouth at  bedtime. What changed: You were already taking a medication with the same name, and this prescription was added. Make sure you understand how and when to take each.   lamoTRIgine 100 MG tablet Commonly known as: LAMICTAL Take 1 tablet (100 mg total) by mouth every morning. Start taking on: August 11, 2019 What changed:   how much to take  when to take this  additional instructions   levothyroxine 50 MCG tablet Commonly known as: SYNTHROID Take 50 mcg by mouth daily before breakfast.   LORazepam 1 MG tablet Commonly known as: ATIVAN Take 1 tablet (1 mg total) by mouth 2 (two) times daily as needed for seizure (or shaking).   meloxicam 15 MG tablet Commonly known as: MOBIC Take 1 tablet (15 mg total) by mouth daily as needed for pain.   nicotine 14 mg/24hr patch Commonly known as: NICODERM CQ - dosed in mg/24 hours Place 1 patch (14 mg total) onto the skin daily. Start taking on: August 11, 2019   OLANZapine 10 MG tablet Commonly known as: ZYPREXA Take 10-20 mg by mouth daily. Take 10 mg by mouth in the morning and 20 mg by mouth at bedtime   propranolol ER 60 MG 24 hr capsule Commonly known as: INDERAL LA Take 60 mg by mouth daily.   risperiDONE 3 MG tablet Commonly known as: RISPERDAL Take 6 mg by mouth at bedtime.   traZODone 150 MG tablet Commonly known as: DESYREL Take 150 mg by mouth at bedtime.   vitamin C 1000 MG tablet Take 1,000 mg by mouth daily.   Vitamin D (Ergocalciferol) 1.25 MG (50000 UT) Caps capsule Commonly known as: DRISDOL Take 50,000 Units by mouth every 7 (seven) days.        DISCHARGE INSTRUCTIONS:   DIET:  Regular diet DISCHARGE CONDITION:  Stable ACTIVITY:  Activity as tolerated OXYGEN:  Home Oxygen: No.  Oxygen Delivery: room air DISCHARGE LOCATION:  home   If you experience worsening of your admission symptoms, develop shortness of breath, life threatening emergency, suicidal or homicidal thoughts you must seek  medical attention immediately by calling 911 or calling your MD immediately  if symptoms less severe.  You Must read complete instructions/literature along with all the possible adverse reactions/side effects for all the Medicines you take and that have been prescribed to you. Take any new Medicines after you have completely understood and accpet all the possible adverse reactions/side effects.   Please note  You were cared for by a hospitalist during your hospital stay. If you have any questions about your discharge medications or the care you received while you were in the hospital after you are discharged, you can call the unit and asked to speak with the hospitalist on call if the hospitalist that took care of you is not available. Once you are discharged, your primary care physician will handle any further medical issues. Please note that NO REFILLS for any discharge medications will be authorized once you are discharged, as it is imperative that you return to your primary care physician (or establish a relationship with a primary care physician if you do not have one) for your aftercare needs  so that they can reassess your need for medications and monitor your lab values.    On the day of Discharge:  VITAL SIGNS:  Blood pressure (!) 130/91, pulse 91, temperature 98.6 F (37 C), temperature source Oral, resp. rate 18, height 6' 0.01" (1.829 m), weight 100.2 kg, SpO2 100 %. PHYSICAL EXAMINATION:  GENERAL:  28 y.o.-year-old patient lying in the bed with no acute distress.  EYES: Pupils equal, round, reactive to light and accommodation. No scleral icterus. Extraocular muscles intact.  HEENT: Head atraumatic, normocephalic. Oropharynx and nasopharynx clear.  NECK:  Supple, no jugular venous distention. No thyroid enlargement, no tenderness.  LUNGS: Normal breath sounds bilaterally, no wheezing, rales,rhonchi or crepitation. No use of accessory muscles of respiration.  CARDIOVASCULAR: S1, S2  normal. No murmurs, rubs, or gallops.  ABDOMEN: Soft, non-tender, non-distended. Bowel sounds present. No organomegaly or mass.  EXTREMITIES: No pedal edema, cyanosis, or clubbing.  NEUROLOGIC: Cranial nerves II through XII are intact. Muscle strength 5/5 in all extremities. Sensation intact. Gait not checked.  PSYCHIATRIC: The patient is alert and oriented x 3.  SKIN: No obvious rash, lesion, or ulcer.  DATA REVIEW:   CBC Recent Labs  Lab 08/09/19 0512  WBC 7.9  HGB 14.5  HCT 41.6  PLT 178    Chemistries  Recent Labs  Lab 08/06/19 1601  08/09/19 0512  NA 133*   < > 139  K 4.0   < > 3.7  CL 99   < > 106  CO2 22   < > 21*  GLUCOSE 125*   < > 88  BUN 11   < > 7  CREATININE 1.27*   < > 0.93  CALCIUM 9.6   < > 9.2  MG  --   --  2.0  AST 28  --   --   ALT 11  --   --   ALKPHOS 41  --   --   BILITOT 0.9  --   --    < > = values in this interval not displayed.     Microbiology Results  Results for orders placed or performed during the hospital encounter of 08/06/19  SARS Coronavirus 2 [facility] order, Performed in [facility] hospital lab) Nasopharyngeal Nasopharyngeal Swab     Status: None   Collection Time: 08/06/19  6:04 PM   Specimen: Nasopharyngeal Swab  Result Value Ref Range Status   SARS Coronavirus 2 NEGATIVE NEGATIVE Final    Comment: (NOTE) If result is NEGATIVE SARS-CoV-2 target nucleic acids are NOT DETECTED. The SARS-CoV-2 RNA is generally detectable in upper and lower  respiratory specimens during the acute phase of infection. The lowest  concentration of SARS-CoV-2 viral copies this assay can detect is 250  copies / mL. A negative result does not preclude SARS-CoV-2 infection  and should not be used as the sole basis for treatment or other  patient management decisions.  A negative result may occur with  improper specimen collection / handling, submission of specimen other  than nasopharyngeal swab, presence of viral mutation(s) within the  areas  targeted by this assay, and inadequate number of viral copies  (<250 copies / mL). A negative result must be combined with clinical  observations, patient history, and epidemiological information. If result is POSITIVE SARS-CoV-2 target nucleic acids are DETECTED. The SARS-CoV-2 RNA is generally detectable in upper and lower  respiratory specimens dur ing the acute phase of infection.  Positive  results are indicative of active infection with SARS-CoV-2.  Clinical  correlation with patient history and other diagnostic information is  necessary to determine patient infection status.  Positive results do  not rule out bacterial infection or co-infection with other viruses. If result is PRESUMPTIVE POSTIVE SARS-CoV-2 nucleic acids MAY BE PRESENT.   A presumptive positive result was obtained on the submitted specimen  and confirmed on repeat testing.  While 2019 novel coronavirus  (SARS-CoV-2) nucleic acids may be present in the submitted sample  additional confirmatory testing may be necessary for epidemiological  and / or clinical management purposes  to differentiate between  SARS-CoV-2 and other Sarbecovirus currently known to infect humans.  If clinically indicated additional testing with an alternate test  methodology [phone]([id number]) is advised. The SARS-CoV-2 RNA is generally  detectable in upper and lower respiratory sp ecimens during the acute  phase of infection. The expected result is Negative. Fact Sheet for Patients:  BoilerBrush.com.cyhttps://www.fda.gov/media/136312/download Fact Sheet for Healthcare Providers: https://pope.com/https://www.fda.gov/media/136313/download This test is not yet approved or cleared by the [place] FDA and has been authorized for detection and/or diagnosis of SARS-CoV-2 by FDA under an Emergency Use Authorization (EUA).  This EUA will remain in effect (meaning this test can be used) for the duration of the COVID-19 declaration under Section 564(b)(1) of the Act, 21 U.S.C. section  360bbb-3(b)(1), unless the authorization is terminated or revoked sooner. Performed at [facility], [address] [place], [place] [address]     RADIOLOGY:  Ct Cervical Spine Wo Contrast  Result Date: 08/09/2019 CLINICAL DATA:  Fall last night.  Ataxia. EXAM: CT CERVICAL SPINE WITHOUT CONTRAST TECHNIQUE: Multidetector CT imaging of the cervical spine was performed without intravenous contrast. Multiplanar CT image reconstructions were also generated. COMPARISON:  CT of the cervical spine without contrast 08/06/2019 and 02/06/2017 FINDINGS: Alignment: There is straightening of the normal cervical lordosis. No significant listhesis or scoliosis is present. Skull base and vertebrae: Craniocervical junction is normal. Vertebral body heights are maintained. No acute or healing fractures are present. Soft tissues and spinal canal: No prevertebral fluid or swelling. No visible canal hematoma. Disc levels: Mild endplate degenerative changes are noted with anterior osteophyte at C4-5. This may reflect remote trauma no significant disc disease or stenosis is evident. Upper chest: Lung apices are clear.  Thoracic inlet is normal. IMPRESSION: 1. No acute trauma or significant interval changes of the cervical spine. 2. Remote trauma or degenerative change at C4-5 is stable. Electronically Signed   By: Marin Robertshristopher  Mattern M.D.   On: 08/09/2019 18:36     Management plans discussed with the patient, family and they are in agreement.  CODE STATUS: Full Code   TOTAL TIME TAKING CARE OF THIS PATIENT: 38 minutes.    Lindyn Vossler M.D on 08/10/2019 at 3:20 PM  Between 7am to 6pm - Pager - [phone]  After 6pm go to [email or web address] - Social research officer, governmentpassword [email or web address]  [facility]  Office  [phone]  CC: Primary care physician; [facility]   Note: This dictation was prepared with Dragon dictation along with smaller phrase technology. Any transcriptional errors that result  from this process are unintentional.

## 2019-08-10 NOTE — Progress Notes (Signed)
Physical Therapy Treatment Patient Details Name: Daniel Craig MRN: [id number] DOB: [birth year] Today's Date: 08/10/2019    History of Present Illness  The patient with past medical history of epilepsy presents to the emergency department immediately following discharge from the hospital after being found on the curb awaiting his taxi in a postictal state.      PT Comments    Pt presented with min deficits in transfers, gait, and balance but made good progress towards goals this session.  Pt was Ind with bed mobility tasks and SBA with transfers demonstrating good eccentric and concentric control and good initial stability upon standing.  Pt participated with gradually increasing intensity of activity this session with no adverse symptoms noted.  Pt demonstrated with min deficits with higher level static and dynamic balance skills including SLS times of </= 3 sec bilaterally.  Pt was overall steady with amb without an AD but did drift left/right minimally.  Pt will benefit from HHPT services upon discharge to safely address above deficits for decreased caregiver assistance/fall risk and eventual return to PLOF.       Follow Up Recommendations  Supervision/Assistance - 24 hour;Home health PT     Equipment Recommendations  None recommended by PT    Recommendations for Other Services       Precautions / Restrictions Precautions Precautions: Fall Restrictions Weight Bearing Restrictions: No    Mobility  Bed Mobility Overal bed mobility: Independent                Transfers Overall transfer level: Needs assistance Equipment used: None Transfers: Sit to/from Stand Sit to Stand: Supervision         General transfer comment: Good eccentric and concentric control with transfers with no LOB upon initial stand  Ambulation/Gait Ambulation/Gait assistance: Supervision Gait Distance (Feet): 200 Feet Assistive device: None Gait Pattern/deviations: Drifts right/left Gait  velocity: WNL   General Gait Details: Minor drifting left/right during amb without an AD but steady without LOB   Stairs Stairs: Yes Stairs assistance: Supervision Stair Management: One rail Left Number of Stairs: 8 General stair comments: Good eccentric and concentric control ascending and descending steps with one rail   Wheelchair Mobility    Modified Rankin (Stroke Patients Only)       Balance Overall balance assessment: History of Falls;Needs assistance   Sitting balance-Leahy Scale: Good     Standing balance support: No upper extremity supported Standing balance-Leahy Scale: Good   Single Leg Stance - Right Leg: 3 Single Leg Stance - Left Leg: 1                        Cognition Arousal/Alertness: Awake/alert Behavior During Therapy: Impulsive Overall Cognitive Status: Within Functional Limits for tasks assessed                                        Exercises Other Exercises Other Exercises: Static and dynamic standing balance training with various foot positions and combinations of eyes open/closed and head still/head turns    General Comments        Pertinent Vitals/Pain Pain Assessment: No/denies pain    Home Living                      Prior Function            PT Goals (current goals can  now be found in the care plan section) Progress towards PT goals: Progressing toward goals    Frequency    Min 2X/week      PT Plan Current plan remains appropriate    Co-evaluation              AM-PAC PT "6 Clicks" Mobility   Outcome Measure  Help needed turning from your back to your side while in a flat bed without using bedrails?: None Help needed moving from lying on your back to sitting on the side of a flat bed without using bedrails?: None Help needed moving to and from a bed to a chair (including a wheelchair)?: None Help needed standing up from a chair using your arms (e.g., wheelchair or bedside  chair)?: None Help needed to walk in hospital room?: A Little Help needed climbing 3-5 steps with a railing? : A Little 6 Click Score: 22    End of Session Equipment Utilized During Treatment: Gait belt Activity Tolerance: Patient tolerated treatment well Patient left: in bed;with nursing/sitter in room;with call bell/phone within reach Nurse Communication: Mobility status PT Visit Diagnosis: Unsteadiness on feet (R26.81);Other abnormalities of gait and mobility (R26.89);History of falling (Z91.81)     Time: [id number] PT Time Calculation (min) (ACUTE ONLY): 23 min  Charges:  $Gait Training: 8-22 mins $Therapeutic Exercise: 8-22 mins                     D. Scott Nikia Mangino PT, DPT 08/10/19, 3:17 PM

## 2019-08-11 ENCOUNTER — Emergency Department
Admission: EM | Admit: 2019-08-11 | Discharge: 2019-08-11 | Disposition: A | Discharge status: Home / Self Care | Payer: Medicaid Other | Attending: Emergency Medicine | Admitting: Emergency Medicine

## 2019-08-11 ENCOUNTER — Other Ambulatory Visit: Payer: Self-pay

## 2019-08-11 DIAGNOSIS — F200 Paranoid schizophrenia: Secondary | ICD-10-CM | POA: Diagnosis present

## 2019-08-11 DIAGNOSIS — Z79899 Other long term (current) drug therapy: Secondary | ICD-10-CM | POA: Insufficient documentation

## 2019-08-11 DIAGNOSIS — F1210 Cannabis abuse, uncomplicated: Secondary | ICD-10-CM | POA: Diagnosis not present

## 2019-08-11 DIAGNOSIS — F209 Schizophrenia, unspecified: Secondary | ICD-10-CM | POA: Diagnosis not present

## 2019-08-11 DIAGNOSIS — R440 Auditory hallucinations: Secondary | ICD-10-CM | POA: Diagnosis not present

## 2019-08-11 DIAGNOSIS — F17210 Nicotine dependence, cigarettes, uncomplicated: Secondary | ICD-10-CM | POA: Insufficient documentation

## 2019-08-11 DIAGNOSIS — Z046 Encounter for general psychiatric examination, requested by authority: Secondary | ICD-10-CM | POA: Diagnosis present

## 2019-08-11 LAB — CBC
HCT: 40.8 % (ref 39.0–52.0)
Hemoglobin: 14.2 g/dL (ref 13.0–17.0)
MCH: 30.6 pg (ref 26.0–34.0)
MCHC: 34.8 g/dL (ref 30.0–36.0)
MCV: 87.9 fL (ref 80.0–100.0)
Platelets: 192 10*3/uL (ref 150–400)
RBC: 4.64 MIL/uL (ref 4.22–5.81)
RDW: 12.4 % (ref 11.5–15.5)
WBC: 6.9 10*3/uL (ref 4.0–10.5)
nRBC: 0 % (ref 0.0–0.2)

## 2019-08-11 LAB — COMPREHENSIVE METABOLIC PANEL
ALT: 19 U/L (ref 0–44)
AST: 22 U/L (ref 15–41)
Albumin: 4.1 g/dL (ref 3.5–5.0)
Alkaline Phosphatase: 32 U/L — ABNORMAL LOW (ref 38–126)
Anion gap: 11 (ref 5–15)
BUN: 10 mg/dL (ref 6–20)
CO2: 21 mmol/L — ABNORMAL LOW (ref 22–32)
Calcium: 9.7 mg/dL (ref 8.9–10.3)
Chloride: 108 mmol/L (ref 98–111)
Creatinine, Ser: 0.87 mg/dL (ref 0.61–1.24)
GFR calc Af Amer: >60 mL/min (ref >60)
GFR calc non Af Amer: >60 mL/min (ref >60)
Glucose, Bld: 100 mg/dL — ABNORMAL HIGH (ref 70–99)
Potassium: 3.4 mmol/L — ABNORMAL LOW (ref 3.5–5.1)
Sodium: 140 mmol/L (ref 135–145)
Total Bilirubin: 0.9 mg/dL (ref 0.3–1.2)
Total Protein: 7.5 g/dL (ref 6.5–8.1)

## 2019-08-11 LAB — ACETAMINOPHEN LEVEL: Acetaminophen (Tylenol), Serum: <10 ug/mL — ABNORMAL LOW (ref 10–30)

## 2019-08-11 LAB — SALICYLATE LEVEL: Salicylate Lvl: <7 mg/dL (ref 2.8–30.0)

## 2019-08-11 LAB — ETHANOL: Alcohol, Ethyl (B): <10 mg/dL (ref <10)

## 2019-08-11 MED ORDER — PROPRANOLOL HCL ER 60 MG PO CP24
60.0000 mg | ORAL_CAPSULE | Freq: Every day | ORAL | Status: DC
  Filled 2019-08-11: qty 1

## 2019-08-11 MED ORDER — DIVALPROEX SODIUM ER 250 MG PO TB24: 500.0000 mg | ORAL_TABLET | ORAL | Status: DC

## 2019-08-11 MED ORDER — IBUPROFEN 600 MG PO TABS
600.0000 mg | ORAL_TABLET | Freq: Once | ORAL | Status: AC
  Administered 2019-08-11: 21:00:00 600 mg via ORAL
  Filled 2019-08-11: qty 1

## 2019-08-11 MED ORDER — LAMOTRIGINE 100 MG PO TABS
100.0000 mg | ORAL_TABLET | Freq: Every morning | ORAL | Status: DC
  Administered 2019-08-11: 17:00:00 100 mg via ORAL
  Filled 2019-08-11: qty 1

## 2019-08-11 MED ORDER — ETHOSUXIMIDE 250 MG PO CAPS
250.0000 mg | ORAL_CAPSULE | Freq: Two times a day (BID) | ORAL | Status: DC
  Administered 2019-08-11 (×2): 250 mg via ORAL
  Filled 2019-08-11 (×3): qty 1

## 2019-08-11 MED ORDER — TRAZODONE HCL 50 MG PO TABS
150.0000 mg | ORAL_TABLET | Freq: Every day | ORAL | Status: DC
  Administered 2019-08-11: 150 mg via ORAL
  Filled 2019-08-11: qty 1

## 2019-08-11 MED ORDER — LEVOTHYROXINE SODIUM 50 MCG PO TABS: 50.0000 ug | ORAL_TABLET | Freq: Every day | ORAL | Status: DC

## 2019-08-11 MED ORDER — RISPERIDONE 1 MG PO TABS
6.0000 mg | ORAL_TABLET | Freq: Every day | ORAL | Status: DC
  Administered 2019-08-11: 6 mg via ORAL
  Filled 2019-08-11: qty 6

## 2019-08-11 MED ORDER — OLANZAPINE 10 MG PO TABS
10.0000 mg | ORAL_TABLET | Freq: Every day | ORAL | Status: DC
  Administered 2019-08-11: 15 mg via ORAL
  Filled 2019-08-11: qty 2

## 2019-08-11 MED ORDER — LAMOTRIGINE 100 MG PO TABS
200.0000 mg | ORAL_TABLET | Freq: Every day | ORAL | Status: DC
  Administered 2019-08-11: 200 mg via ORAL
  Filled 2019-08-11: qty 2

## 2019-08-11 NOTE — ED Notes (Signed)
BEHAVIORAL HEALTH ROUNDING Patient sleeping: No. Patient alert and oriented: yes Behavior appropriate: Yes.  ; If no, describe:  Nutrition and fluids offered: yes Toileting and hygiene offered: Yes  Sitter present: q15 minute observations  

## 2019-08-11 NOTE — ED Notes (Signed)
Patient up in room. Patient unsteady standing by him self. Patient redirected back to by this

## 2019-08-11 NOTE — ED Notes (Signed)
BEHAVIORAL HEALTH ROUNDING Patient sleeping: Yes.   Patient alert and oriented: eyes closed  Appears asleep Behavior appropriate: Yes.  ; If no, describe:  Nutrition and fluids offered: Yes  Toileting and hygiene offered: sleeping Sitter present: q 15 minute observations 

## 2019-08-11 NOTE — ED Notes (Signed)
Patient observed lying in bed with eyes closed  Even, unlabored respirations observed   NAD pt appears to be sleeping  I will continue to monitor along with every 15 minute visual observations     

## 2019-08-11 NOTE — ED Notes (Signed)
This Probation officer called patient's father legal guardian to inform him that patient is going to be discharged and will be transported via cab. Patient's father agreed with the plan . No issues.

## 2019-08-11 NOTE — ED Notes (Signed)
ED  Is the patient under IVC or is there intent for IVC:  voluntary Is the patient medically cleared: Yes.   Is there vacancy in the ED BHU: Yes.   Is the population mix appropriate for patient: Yes.   Is the patient awaiting placement in inpatient or outpatient setting: Yes.   Has the patient had a psychiatric consult: Yes.   Survey of unit performed for contraband, proper placement and condition of furniture, tampering with fixtures in bathroom, shower, and each patient room: Yes.  ; Findings:  APPEARANCE/BEHAVIOR Calm and cooperative NEURO ASSESSMENT Orientation: oriented x3  Denies pain Hallucinations: No.None noted (Hallucinations) denies at this time Speech: Normal Gait: normal RESPIRATORY ASSESSMENT Even  Unlabored respirations  CARDIOVASCULAR ASSESSMENT Pulses equal   regular rate  Skin warm and dry   GASTROINTESTINAL ASSESSMENT no GI complaint EXTREMITIES Full ROM  PLAN OF CARE Provide calm/safe environment. Vital signs assessed twice daily. ED BHU Assessment once each 12-hour shift.  Assure the ED provider has rounded once each shift. Provide and encourage hygiene. Provide redirection as needed. Assess for escalating behavior; address immediately and inform ED provider.  Assess family dynamic and appropriateness for visitation as needed: Yes.  ; If necessary, describe findings:  Educate the patient/family about BHU procedures/visitation: Yes.  ; If necessary, describe findings:

## 2019-08-11 NOTE — Discharge Instructions (Signed)
Please continue to follow-up with your therapist.  Please return for any further problems and please continue all your medicines.

## 2019-08-11 NOTE — ED Notes (Signed)
BEHAVIORAL HEALTH ROUNDING Patient sleeping: Yes.   Patient alert and oriented: eyes closed  Appears asleep Behavior appropriate: Yes.  ; If no, describe:  Nutrition and fluids offered: Yes  Toileting and hygiene offered: sleeping Sitter present: q 15 minute observations   ENVIRONMENTAL ASSESSMENT Potentially harmful objects out of patient reach: Yes.   Personal belongings secured: Yes.   Patient dressed in hospital provided attire only: Yes.   Plastic bags out of patient reach: Yes.   Patient care equipment (cords, cables, call bells, lines, and drains) shortened, removed, or accounted for: Yes.   Equipment and supplies removed from bottom of stretcher: Yes.   Potentially toxic materials out of patient reach: Yes.   Sharps container removed or out of patient reach: Yes.   

## 2019-08-11 NOTE — ED Notes (Signed)
Patient is stable in NAD.Patient is discharge to home via cab. Discharge instruction given and follow up instruction. Patient's belongings given at discharge. No issues

## 2019-08-11 NOTE — ED Notes (Signed)
This Probation officer called Atlee Abide [phone]) twice with no answer. left VM. Patient ready for discharge and need a transportation home.

## 2019-08-11 NOTE — ED Provider Notes (Signed)
[facility] Emergency Department Provider Note  ____________________________________________  Time seen: Approximately 2:57 AM  I have reviewed the triage vital signs and the nursing notes.   HISTORY  Chief Complaint Psychiatric Evaluation   HPI Daniel Craig is a 28 y.o. male with history of seizures and schizophrenia who presents voluntarily for auditory hallucinations.  Patient reports having them in the past.  This time around his hallucinations started yesterday morning after patient was discharged from the hospital.  He reports that hearing several different voices in different languages.  The voices say "bad things about me which make me sad".  He denies suicidal or homicidal ideation.  He endorses compliance with his medications.  He denies alcohol or drug use.  He denies any medical concerns.  Patient was discharged from the hospital yesterday after being admitted for his seizure disorder.   Past Medical History:  Diagnosis Date  . Schizophrenia (HCC)   . Seizures [facility](HCC)     Patient Active Problem List   Diagnosis Date Noted  . Epilepsy (HCC) 08/07/2019  . Extremely severe auditory hallucinations 06/02/2019  . Hypoglycemia 11/09/2018  . Schizophrenia, paranoid (HCC) 02/27/2018  . Cannabis use disorder, moderate, dependence (HCC) 02/27/2018  . Tobacco use disorder 02/27/2018  . 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 Altered mental status 12/28/2017  . Fever 08/11/2017  . Acute URI 08/11/2017  . Delirium 06/25/2017  . Schizophrenia (HCC) 06/25/2017  . Seizures (HCC) 06/25/2017    Past Surgical History:  Procedure Laterality Date  . MOUTH SURGERY      Prior to Admission medications   Medication Sig Start Date End Date Taking? Authorizing Provider  amoxicillin-clavulanate (AUGMENTIN) 875-125 MG tablet Take 1 tablet by mouth every 12 (twelve) hours for 13 days. 08/10/19 08/23/19  Jama Flavorsjie, Jude, MD  Ascorbic Acid (VITAMIN C) 1000 MG tablet Take  1,000 mg by mouth daily.    [provider]  divalproex (DEPAKOTE ER) 500 MG 24 hr tablet Take 500-1,000 mg by mouth See admin instructions. 500 mg every morning and 1000 mg at bedtime    [provider]  ethosuximide (ZARONTIN) 250 MG capsule Take 1 capsule (250 mg total) by mouth 2 (two) times daily. 07/04/19 10/02/19  Charm RingsLord, Jamison Y, NP  lamoTRIgine (LAMICTAL) 100 MG tablet Take 1 tablet (100 mg total) by mouth every morning. 08/11/19   Enid Baasjie, Jude, MD  lamoTRIgine (LAMICTAL) 200 MG tablet Take 1 tablet (200 mg total) by mouth at bedtime. 08/10/19   Enid Baasjie, Jude, MD  levothyroxine (SYNTHROID) 50 MCG tablet Take 50 mcg by mouth daily before breakfast.    [provider]  LORazepam (ATIVAN) 1 MG tablet Take 1 tablet (1 mg total) by mouth 2 (two) times daily as needed for seizure (or shaking). 01/06/17   Jennye MoccasinQuigley, Brian S, MD  meloxicam (MOBIC) 15 MG tablet Take 1 tablet (15 mg total) by mouth daily as needed for pain. 11/11/18   Enid BaasKalisetti, Radhika, MD  nicotine (NICODERM CQ - DOSED IN MG/24 HOURS) 14 mg/24hr patch Place 1 patch (14 mg total) onto the skin daily. 08/11/19   Ojie, Jude, MD  OLANZapine (ZYPREXA) 10 MG tablet Take 10-20 mg by mouth daily. Take 10 mg by mouth in the morning and 20 mg by mouth at bedtime    [provider]  propranolol ER (INDERAL LA) 60 MG 24 hr capsule Take 60 mg by mouth daily.    [provider]  risperiDONE (RISPERDAL) 3 MG tablet Take 6 mg by mouth  at bedtime.  10/01/16   [provider]  traZODone (DESYREL) 150 MG tablet Take 150 mg by mouth at bedtime.    [provider]  Vitamin D, Ergocalciferol, (DRISDOL) 50000 units CAPS capsule Take 50,000 Units by mouth every 7 (seven) days.    [provider]    Allergies Keppra [levetiracetam], Trileptal [oxcarbazepine], and Vimpat [lacosamide]  Family History  Family history unknown: Yes    Social History Social History   Tobacco Use  . Smoking  status: Current Every Day Smoker    Packs/day: 1.00    Types: Cigarettes  . Smokeless tobacco: Never Used  Substance Use Topics  . Alcohol use: Yes    Alcohol/week: 4.0 standard drinks    Types: 4 Shots of liquor per week    Comment: pt states 4 shots of liqour oer week  . Drug use: Yes    Types: Marijuana    Review of Systems  Constitutional: Negative for fever. Eyes: Negative for visual changes. ENT: Negative for sore throat. Neck: No neck pain  Cardiovascular: Negative for chest pain. Respiratory: Negative for shortness of breath. Gastrointestinal: Negative for abdominal pain, vomiting or diarrhea. Genitourinary: Negative for dysuria. Musculoskeletal: Negative for back pain. Skin: Negative for rash. Neurological: Negative for headaches, weakness or numbness. Psych: No SI or HI.  Auditory hallucination  ____________________________________________   PHYSICAL EXAM:  VITAL SIGNS: Vitals:   08/11/19 0307  BP: 131/81  Pulse: 93  Resp: 16  Temp: 98.7 F (37.1 C)  SpO2: 98%     Constitutional: Alert and oriented. Well appearing and in no apparent distress. HEENT:      Head: Normocephalic and atraumatic.         Eyes: Conjunctivae are normal. Sclera is non-icteric.       Mouth/Throat: Mucous membranes are moist.       Neck: Supple with no signs of meningismus. Cardiovascular: Regular rate and rhythm.  Respiratory: Normal respiratory effort.  Gastrointestinal: Soft, non tender, and non distended. Musculoskeletal: No edema, cyanosis, or erythema of extremities. Neurologic: Normal speech and language. Face is symmetric. Moving all extremities. No gross focal neurologic deficits are appreciated. Skin: Skin is warm, dry and intact. No rash noted. Psychiatric: Mood and affect are normal. Speech and behavior are normal.  ____________________________________________   LABS (all labs ordered are listed, but only abnormal results are displayed)  Labs Reviewed   COMPREHENSIVE METABOLIC PANEL - Abnormal; Notable for the following components:      Result Value   Potassium 3.4 (*)    CO2 21 (*)    Glucose, Bld 100 (*)    Alkaline Phosphatase 32 (*)    All other components within normal limits  ACETAMINOPHEN LEVEL - Abnormal; Notable for the following components:   Acetaminophen (Tylenol), Serum <10 (*)    All other components within normal limits  CBC  ETHANOL  SALICYLATE LEVEL  URINE DRUG SCREEN, QUALITATIVE ([facility] ONLY)   ____________________________________________  EKG  none  ____________________________________________  RADIOLOGY  none  ____________________________________________   PROCEDURES  Procedure(s) performed: None Procedures Critical Care performed:  None ____________________________________________   INITIAL IMPRESSION / ASSESSMENT AND PLAN / ED COURSE  28 y.o. male with history of seizures and schizophrenia who presents voluntarily for auditory hallucinations.  Patient is pleasant and cooperative.  Upset about hearing voices telling him bad things about himself.  He has no SI or HI.  He is neurologically effective otherwise.  Will get labs for medical clearance and consult psychiatry.  _________________________ 6:08 AM on 08/11/2019 -----------------------------------------  Medically cleared awaiting psych eval    As part of my medical decision making, I reviewed the following data within the [facility] notes reviewed and incorporated, Labs reviewed , Old chart reviewed, A consult was requested and obtained from this/these consultant(s) Psychiatry, Notes from prior ED visits and [place] Controlled Substance Database   Patient was evaluated in Emergency Department today for the symptoms described in the history of present illness. Patient was evaluated in the context of the global COVID-19 pandemic, which necessitated consideration that the patient might be at risk for infection with the  SARS-CoV-2 virus that causes COVID-19. Institutional protocols and algorithms that pertain to the evaluation of patients at risk for COVID-19 are in a state of rapid change based on information released by regulatory bodies including the CDC and federal and state organizations. These policies and algorithms were followed during the patient's care in the ED.   ____________________________________________   FINAL CLINICAL IMPRESSION(S) / ED DIAGNOSES   Final diagnoses:  Schizophrenia, unspecified type ([place])  Auditory hallucinations      NEW MEDICATIONS STARTED DURING THIS VISIT:  ED Discharge Orders    None       Note:  This document was prepared using Dragon voice recognition software and may include unintentional dictation errors.    Alfred Levins, [place], MD 08/11/19 [phone]

## 2019-08-11 NOTE — ED Triage Notes (Signed)
Pt. Alert and oriented, warm and dry, in no distress. Pt. Denies SI, HI, and VH. Patient tearful and states hearing all types ov voices. Patient has multiple scraps on body from seizure like activity and falling. Pt. Encouraged to let nursing staff know of any concerns or needs.

## 2019-08-11 NOTE — ED Notes (Signed)
Report to include Situation, Background, Assessment, and Recommendations received from RN. Patient alert and oriented, warm and dry, in no acute distress. Patient denies SI, HI, AVH and pain. Patient made aware of Q15 minute rounds and Rover and Officer presence for their safety. Patient instructed to come to me with needs or concerns.   

## 2019-08-11 NOTE — Consult Note (Signed)
[facility] Psych ED Discharge  08/11/2019 3:30 PM Daniel JanskyKeon Evin Craig  MRN:  [id number] Principal Problem: Schizophrenia, paranoid [facility](HCC) Discharge Diagnoses: Principal Problem:   Schizophrenia, paranoid (HCC)  Subjective: Im ready to go home.  Daniel Craig is a 28 y.o. male with history of seizures and schizophrenia who presents voluntarily for auditory hallucinations.  Patient reports having them in the past.  This time around his hallucinations started yesterday morning after patient was discharged from the hospital.  He reports that hearing several different voices in different languages.  The voices say "bad things about me which make me sad".  He denies suicidal or homicidal ideation.  He endorses compliance with his medications.  He denies alcohol or drug use.  He denies any medical concerns.  Patient was discharged from the hospital yesterday after being admitted for his seizure disorder.  Total Time spent with patient: 20 minutes  Past Psychiatric History: schizophrenia  Past Medical History:  Past Medical History:  Diagnosis Date  . Schizophrenia (HCC)   . Seizures (HCC)     Past Surgical History:  Procedure Laterality Date  . MOUTH SURGERY     Family History:  Family History  Family history unknown: Yes   Family Psychiatric  History: none Social History:  Social History   Substance and Sexual Activity  Alcohol Use Yes  . Alcohol/week: 4.0 standard drinks  . Types: 4 Shots of liquor per week   Comment: pt states 4 shots of liqour oer week     Social History   Substance and Sexual Activity  Drug Use Yes  . Types: Marijuana    Social History   Socioeconomic History  . Marital status: Single    Spouse name: Not on file  . Number of children: Not on file  . Years of education: Not on file  . Highest education level: Not on file  Occupational History  . Not on file  Social Needs  . Financial resource strain: Not on file  . Food insecurity    Worry: Not on  file    Inability: Not on file  . Transportation needs    Medical: Not on file    Non-medical: Not on file  Tobacco Use  . Smoking status: Current Every Day Smoker    Packs/day: 1.00    Types: Cigarettes  . Smokeless tobacco: Never Used  Substance and Sexual Activity  . Alcohol use: Yes    Alcohol/week: 4.0 standard drinks    Types: 4 Shots of liquor per week    Comment: pt states 4 shots of liqour oer week  . Drug use: Yes    Types: Marijuana  . Sexual activity: Not on file  Lifestyle  . Physical activity    Days per week: Not on file    Minutes per session: Not on file  . Stress: Not on file  Relationships  . Social Musicianconnections    Talks on phone: Not on file    Gets together: Not on file    Attends religious service: Not on file    Active member of club or organization: Not on file    Attends meetings of clubs or organizations: Not on file    Relationship status: Not on file  Other Topics Concern  . Not on file  Social History Narrative  . Not on file    Has this patient used any form of tobacco in the last 30 days? (Cigarettes, Smokeless Tobacco, Cigars, and/or Pipes) NA  Current Medications: Current  Facility-Administered Medications  Medication Dose Route Frequency Provider Last Rate Last Dose  . divalproex (DEPAKOTE ER) 24 hr tablet 500-1,000 mg  500-1,000 mg Oral See admin instructions Don Perking, [place], MD      . ethosuximide (ZARONTIN) capsule 250 mg  250 mg Oral BID Don Perking, [place], MD   250 mg at 08/11/19 0539  . lamoTRIgine (LAMICTAL) tablet 100 mg  100 mg Oral q morning - 10a Verones[place], [place], MD      . lamoTRIgine (LAMICTAL) tablet 200 mg  200 mg Oral QHS Don Perking, [place], MD   200 mg at 08/11/19 7673  . levothyroxine (SYNTHROID) tablet 50 mcg  50 mcg Oral QAC breakfast Don Perking, [place], MD      . OLANZapine [facility]) tablet 10-20 mg  10-20 mg Oral Daily Don Perking, [place], MD      . propranolol ER (INDERAL LA) 24 hr capsule 60 mg  60 mg Oral  Daily Don Perking, [place], MD      . risperiDONE (RISPERDAL) tablet 6 mg  6 mg Oral QHS Don Perking, [place], MD   6 mg at 08/11/19 0217  . traZODone (DESYREL) tablet 150 mg  150 mg Oral QHS Don Perking, [place], MD   150 mg at 08/11/19 0218   Current Outpatient Medications  Medication Sig Dispense Refill  . amoxicillin-clavulanate (AUGMENTIN) 875-125 MG tablet Take 1 tablet by mouth every 12 (twelve) hours for 13 days. 26 tablet 0  . Ascorbic Acid (VITAMIN C) 1000 MG tablet Take 1,000 mg by mouth daily.    . divalproex (DEPAKOTE ER) 500 MG 24 hr tablet Take 500-1,000 mg by mouth See admin instructions. 500 mg every morning and 1000 mg at bedtime    . ethosuximide (ZARONTIN) 250 MG capsule Take 1 capsule (250 mg total) by mouth 2 (two) times daily. 60 capsule 2  . lamoTRIgine (LAMICTAL) 100 MG tablet Take 1 tablet (100 mg total) by mouth every morning. 30 tablet 0  . lamoTRIgine (LAMICTAL) 200 MG tablet Take 1 tablet (200 mg total) by mouth at bedtime. 30 tablet 0  . levothyroxine (SYNTHROID) 50 MCG tablet Take 50 mcg by mouth daily before breakfast.    . LORazepam (ATIVAN) 1 MG tablet Take 1 tablet (1 mg total) by mouth 2 (two) times daily as needed for seizure (or shaking). 10 tablet 0  . meloxicam (MOBIC) 15 MG tablet Take 1 tablet (15 mg total) by mouth daily as needed for pain.    Marland Kitchen OLANZapine (ZYPREXA) 10 MG tablet Take 10-20 mg by mouth daily. Take 10 mg by mouth in the morning and 20 mg by mouth at bedtime    . propranolol ER (INDERAL LA) 60 MG 24 hr capsule Take 60 mg by mouth daily.    . risperiDONE (RISPERDAL) 3 MG tablet Take 6 mg by mouth at bedtime.     . traZODone (DESYREL) 150 MG tablet Take 150 mg by mouth at bedtime.    . Vitamin D, Ergocalciferol, (DRISDOL) 50000 units CAPS capsule Take 50,000 Units by mouth every 7 (seven) days.    . nicotine (NICODERM CQ - DOSED IN MG/24 HOURS) 14 mg/24hr patch Place 1 patch (14 mg total) onto the skin daily. 14 patch 0   PTA Medications: (Not  in a hospital admission)   Musculoskeletal: Strength & Muscle Tone: within normal limits Gait & Station: normal Patient leans: N/A  Psychiatric Specialty Exam: Physical Exam  Nursing note and vitals reviewed. Constitutional: He is oriented to person, place, and time. He appears well-developed and well-nourished.  HENT:  Head: Normocephalic.  Neck: Normal range of motion.  Respiratory: Effort normal.  Musculoskeletal: Normal range of motion.  Neurological: He is alert and oriented to person, place, and time.  Psychiatric: His speech is normal and behavior is normal. Judgment and thought content normal. His mood appears anxious. His affect is blunt. Cognition and memory are normal.    Review of Systems  Psychiatric/Behavioral: The patient is nervous/anxious.   All other systems reviewed and are negative.   Blood pressure 131/81, pulse 93, temperature 98.7 F (37.1 C), temperature source Oral, resp. rate 16, height 6\' 2"  (1.88 m), weight 104.3 kg, SpO2 98 %.Body mass index is 29.53 kg/m.  General Appearance: Casual  Eye Contact:  Good  Speech:  Normal Rate  Volume:  Normal  Mood:  Anxious  Affect:  Blunt  Thought Process:  Coherent and Descriptions of Associations: Intact  Orientation:  Full (Time, Place, and Person)  Thought Content:  WDL and Logical  Suicidal Thoughts:  No  Homicidal Thoughts:  No  Memory:  Immediate;   Good Recent;   Good Remote;   Good  Judgement:  Fair  Insight:  Fair  Psychomotor Activity:  Normal  Concentration:  Concentration: Good and Attention Span: Good  Recall:  Good  Fund of Knowledge:  Good  Language:  Good  Akathisia:  No  Handed:  Right  AIMS (if indicated):     Assets:  Housing Leisure Time Physical Health Resilience Social Support  ADL's:  Intact  Cognition:  WNL  Sleep:        Demographic Factors:  Male, Adolescent or young adult and Living alone  Loss Factors: NA  Historical Factors: NA  Risk Reduction Factors:    Sense of responsibility to family, Positive social support and Positive therapeutic relationship  Continued Clinical Symptoms:  Anxiety, mild  Cognitive Features That Contribute To Risk:  None    Suicide Risk:  Minimal: No identifiable suicidal ideation.  Patients presenting with no risk factors but with morbid ruminations; may be classified as minimal risk based on the severity of the depressive symptoms    Plan Of Craig/Follow-up recommendations:  Schizophrenia, paranoid type: Follow up with outpatient resources at this time.   Seizures: -Continued Depakote  Activity:  as tolerated Diet:  heart healthy diet  Disposition: Discharge home with outpatient resources.  Suella Broad, FNP 08/11/2019, 3:30 PM

## 2019-08-11 NOTE — ED Notes (Signed)
Pt refusing anything to eat and drink at this time. Pt provided with phone to make phone call for someone to pick him up. Will continue to monitor Q15 minute rounds.

## 2019-08-11 NOTE — ED Notes (Signed)
Patient dressed out by this Probation officer and Estate manager/land agent. Belongings placed in bag and labeled.   1- shorts 1-tank top 1-pair shoes 1-jacket 1-life lock box with 2 keys

## 2019-08-11 NOTE — ED Notes (Signed)
Pt refused dinner tray

## 2019-08-13 LAB — PHENYTOIN LEVEL, FREE AND TOTAL
Phenytoin, Free: 1.7 ug/mL (ref 1.0–2.0)
Phenytoin, Total: 14 ug/mL (ref 10.0–20.0)

## 2019-10-07 ENCOUNTER — Ambulatory Visit: Admit: 2019-10-07 | Discharge: 2019-10-08 | Payer: MEDICAID

## 2019-10-07 DIAGNOSIS — G40409 Other generalized epilepsy and epileptic syndromes, not intractable, without status epilepticus: Principal | ICD-10-CM

## 2019-10-07 DIAGNOSIS — G40109 Localization-related (focal) (partial) symptomatic epilepsy and epileptic syndromes with simple partial seizures, not intractable, without status epilepticus: Principal | ICD-10-CM

## 2019-11-18 ENCOUNTER — Emergency Department: Payer: Medicaid Other

## 2019-11-18 ENCOUNTER — Encounter: Payer: Self-pay | Admitting: Emergency Medicine

## 2019-11-18 ENCOUNTER — Other Ambulatory Visit: Payer: Self-pay

## 2019-11-18 ENCOUNTER — Emergency Department
Admission: EM | Admit: 2019-11-18 | Discharge: 2019-11-18 | Disposition: A | Discharge status: Home / Self Care | Payer: Medicaid Other | Source: Home / Self Care | Attending: Emergency Medicine | Admitting: Emergency Medicine

## 2019-11-18 ENCOUNTER — Observation Stay
Admission: EM | Admit: 2019-11-18 | Discharge: 2019-11-19 | Disposition: A | Discharge status: Home / Self Care | Payer: Medicaid Other | Attending: Internal Medicine | Admitting: Internal Medicine

## 2019-11-18 DIAGNOSIS — F1220 Cannabis dependence, uncomplicated: Secondary | ICD-10-CM | POA: Diagnosis present

## 2019-11-18 DIAGNOSIS — G40909 Epilepsy, unspecified, not intractable, without status epilepticus: Secondary | ICD-10-CM

## 2019-11-18 DIAGNOSIS — Z79890 Hormone replacement therapy (postmenopausal): Secondary | ICD-10-CM | POA: Insufficient documentation

## 2019-11-18 DIAGNOSIS — F200 Paranoid schizophrenia: Secondary | ICD-10-CM | POA: Diagnosis not present

## 2019-11-18 DIAGNOSIS — R569 Unspecified convulsions: Secondary | ICD-10-CM

## 2019-11-18 DIAGNOSIS — G40409 Other generalized epilepsy and epileptic syndromes, not intractable, without status epilepticus: Principal | ICD-10-CM | POA: Insufficient documentation

## 2019-11-18 DIAGNOSIS — E875 Hyperkalemia: Secondary | ICD-10-CM | POA: Diagnosis not present

## 2019-11-18 DIAGNOSIS — Z23 Encounter for immunization: Secondary | ICD-10-CM | POA: Diagnosis not present

## 2019-11-18 DIAGNOSIS — U071 COVID-19: Secondary | ICD-10-CM | POA: Diagnosis not present

## 2019-11-18 DIAGNOSIS — Z79899 Other long term (current) drug therapy: Secondary | ICD-10-CM | POA: Insufficient documentation

## 2019-11-18 DIAGNOSIS — E039 Hypothyroidism, unspecified: Secondary | ICD-10-CM

## 2019-11-18 DIAGNOSIS — Z888 Allergy status to other drugs, medicaments and biological substances status: Secondary | ICD-10-CM | POA: Insufficient documentation

## 2019-11-18 DIAGNOSIS — R410 Disorientation, unspecified: Secondary | ICD-10-CM | POA: Diagnosis present

## 2019-11-18 DIAGNOSIS — F17210 Nicotine dependence, cigarettes, uncomplicated: Secondary | ICD-10-CM | POA: Insufficient documentation

## 2019-11-18 LAB — COMPREHENSIVE METABOLIC PANEL
ALT: 23 U/L (ref 0–44)
AST: 29 U/L (ref 15–41)
Albumin: 4.2 g/dL (ref 3.5–5.0)
Alkaline Phosphatase: 41 U/L (ref 38–126)
Anion gap: 17 — ABNORMAL HIGH (ref 5–15)
BUN: 12 mg/dL (ref 6–20)
CO2: 14 mmol/L — ABNORMAL LOW (ref 22–32)
Calcium: 9.7 mg/dL (ref 8.9–10.3)
Chloride: 106 mmol/L (ref 98–111)
Creatinine, Ser: 1.08 mg/dL (ref 0.61–1.24)
GFR calc Af Amer: >60 mL/min (ref >60)
GFR calc non Af Amer: >60 mL/min (ref >60)
Glucose, Bld: 132 mg/dL — ABNORMAL HIGH (ref 70–99)
Potassium: 3.8 mmol/L (ref 3.5–5.1)
Sodium: 137 mmol/L (ref 135–145)
Total Bilirubin: 0.9 mg/dL (ref 0.3–1.2)
Total Protein: 7.6 g/dL (ref 6.5–8.1)

## 2019-11-18 LAB — CBC WITH DIFFERENTIAL/PLATELET
Abs Immature Granulocytes: 0.01 10*3/uL (ref 0.00–0.07)
Abs Immature Granulocytes: 0.03 10*3/uL (ref 0.00–0.07)
Basophils Absolute: 0 10*3/uL (ref 0.0–0.1)
Basophils Absolute: 0 10*3/uL (ref 0.0–0.1)
Basophils Relative: 0 %
Basophils Relative: 0 %
Eosinophils Absolute: 0.1 10*3/uL (ref 0.0–0.5)
Eosinophils Absolute: 0.1 10*3/uL (ref 0.0–0.5)
Eosinophils Relative: 2 %
Eosinophils Relative: 1 %
HCT: 41.8 % (ref 39.0–52.0)
HCT: 44.5 % (ref 39.0–52.0)
Hemoglobin: 14.3 g/dL (ref 13.0–17.0)
Hemoglobin: 14.9 g/dL (ref 13.0–17.0)
Immature Granulocytes: 0 %
Immature Granulocytes: 0 %
Lymphocytes Relative: 47 %
Lymphocytes Relative: 48 %
Lymphs Abs: 2.4 10*3/uL (ref 0.7–4.0)
Lymphs Abs: 5.7 10*3/uL — ABNORMAL HIGH (ref 0.7–4.0)
MCH: 30.8 pg (ref 26.0–34.0)
MCH: 30.2 pg (ref 26.0–34.0)
MCHC: 34.2 g/dL (ref 30.0–36.0)
MCHC: 33.5 g/dL (ref 30.0–36.0)
MCV: 89.9 fL (ref 80.0–100.0)
MCV: 90.1 fL (ref 80.0–100.0)
Monocytes Absolute: 0.6 10*3/uL (ref 0.1–1.0)
Monocytes Absolute: 1.3 10*3/uL — ABNORMAL HIGH (ref 0.1–1.0)
Monocytes Relative: 11 %
Monocytes Relative: 11 %
Neutro Abs: 2.1 10*3/uL (ref 1.7–7.7)
Neutro Abs: 4.9 10*3/uL (ref 1.7–7.7)
Neutrophils Relative %: 40 %
Neutrophils Relative %: 40 %
Platelets: 211 10*3/uL (ref 150–400)
Platelets: 209 10*3/uL (ref 150–400)
RBC: 4.65 MIL/uL (ref 4.22–5.81)
RBC: 4.94 MIL/uL (ref 4.22–5.81)
RDW: 12.7 % (ref 11.5–15.5)
RDW: 13 % (ref 11.5–15.5)
WBC: 5.2 10*3/uL (ref 4.0–10.5)
WBC: 12 10*3/uL — ABNORMAL HIGH (ref 4.0–10.5)
nRBC: 0 % (ref 0.0–0.2)
nRBC: 0 % (ref 0.0–0.2)

## 2019-11-18 LAB — BASIC METABOLIC PANEL
Anion gap: 19 — ABNORMAL HIGH (ref 5–15)
BUN: 12 mg/dL (ref 6–20)
CO2: 12 mmol/L — ABNORMAL LOW (ref 22–32)
Calcium: 9.3 mg/dL (ref 8.9–10.3)
Chloride: 106 mmol/L (ref 98–111)
Creatinine, Ser: 1.21 mg/dL (ref 0.61–1.24)
GFR calc Af Amer: >60 mL/min (ref >60)
GFR calc non Af Amer: >60 mL/min (ref >60)
Glucose, Bld: 107 mg/dL — ABNORMAL HIGH (ref 70–99)
Potassium: 5.4 mmol/L — ABNORMAL HIGH (ref 3.5–5.1)
Sodium: 137 mmol/L (ref 135–145)

## 2019-11-18 LAB — MAGNESIUM: Magnesium: 1.7 mg/dL (ref 1.7–2.4)

## 2019-11-18 LAB — VALPROIC ACID LEVEL
Valproic Acid Lvl: <10 ug/mL — ABNORMAL LOW (ref 50.0–100.0)
Valproic Acid Lvl: 51 ug/mL (ref 50.0–100.0)

## 2019-11-18 LAB — TROPONIN I (HIGH SENSITIVITY): Troponin I (High Sensitivity): <2 ng/L (ref <18)

## 2019-11-18 MED ORDER — ETHOSUXIMIDE 250 MG PO CAPS
250.0000 mg | ORAL_CAPSULE | Freq: Two times a day (BID) | ORAL | Status: DC
  Administered 2019-11-19 (×2): 250 mg via ORAL
  Filled 2019-11-18 (×3): qty 1

## 2019-11-18 MED ORDER — LAMOTRIGINE 100 MG PO TABS
200.0000 mg | ORAL_TABLET | Freq: Every day | ORAL | Status: DC
  Administered 2019-11-19: 200 mg via ORAL
  Filled 2019-11-18: qty 2

## 2019-11-18 MED ORDER — TRAZODONE HCL 50 MG PO TABS
150.0000 mg | ORAL_TABLET | Freq: Every day | ORAL | Status: DC
  Administered 2019-11-19: 150 mg via ORAL
  Filled 2019-11-18: qty 3

## 2019-11-18 MED ORDER — LAMOTRIGINE 100 MG PO TABS
100.0000 mg | ORAL_TABLET | Freq: Every morning | ORAL | Status: DC
  Administered 2019-11-19: 10:00:00 100 mg via ORAL
  Filled 2019-11-18: qty 1

## 2019-11-18 MED ORDER — ONDANSETRON HCL 4 MG/2ML IJ SOLN
4.0000 mg | Freq: Four times a day (QID) | INTRAMUSCULAR | Status: DC | PRN
  Administered 2019-11-18: 4 mg via INTRAVENOUS
  Filled 2019-11-18: qty 2

## 2019-11-18 MED ORDER — SODIUM CHLORIDE 0.9 % IV SOLN
75.0000 mL/h | INTRAVENOUS | Status: DC
  Administered 2019-11-18: 75 mL/h via INTRAVENOUS

## 2019-11-18 MED ORDER — VALPROATE SODIUM 500 MG/5ML IV SOLN
1000.0000 mg | Freq: Once | INTRAVENOUS | Status: AC
  Administered 2019-11-18: 15:00:00 1000 mg via INTRAVENOUS
  Filled 2019-11-18: qty 10

## 2019-11-18 MED ORDER — INFLUENZA VAC SPLIT QUAD 0.5 ML IM SUSY
0.5000 mL | PREFILLED_SYRINGE | INTRAMUSCULAR | Status: AC
  Administered 2019-11-19: 0.5 mL via INTRAMUSCULAR
  Filled 2019-11-18: qty 0.5

## 2019-11-18 MED ORDER — ACETAMINOPHEN 650 MG RE SUPP: 650.0000 mg | RECTAL | Status: DC | PRN

## 2019-11-18 MED ORDER — SODIUM CHLORIDE 0.9 % IV BOLUS: 1000.0000 mL | Freq: Once | INTRAVENOUS | Status: DC

## 2019-11-18 MED ORDER — NICOTINE 14 MG/24HR TD PT24
14.0000 mg | MEDICATED_PATCH | Freq: Every day | TRANSDERMAL | Status: DC
  Administered 2019-11-19: 10:00:00 14 mg via TRANSDERMAL
  Filled 2019-11-18: qty 1

## 2019-11-18 MED ORDER — LORAZEPAM 2 MG/ML IJ SOLN
1.0000 mg | Freq: Once | INTRAMUSCULAR | Status: AC
  Administered 2019-11-18: 1 mg via INTRAVENOUS
  Filled 2019-11-18: qty 1

## 2019-11-18 MED ORDER — ASCORBIC ACID 500 MG PO TABS
1000.0000 mg | ORAL_TABLET | Freq: Every day | ORAL | Status: DC
  Administered 2019-11-19: 10:00:00 1000 mg via ORAL
  Filled 2019-11-18: qty 2

## 2019-11-18 MED ORDER — ACETAMINOPHEN 325 MG PO TABS
650.0000 mg | ORAL_TABLET | ORAL | Status: DC | PRN
  Administered 2019-11-19: 650 mg via ORAL
  Filled 2019-11-18: qty 2

## 2019-11-18 MED ORDER — OLANZAPINE 10 MG PO TABS
20.0000 mg | ORAL_TABLET | Freq: Every day | ORAL | Status: DC
  Administered 2019-11-19: 20 mg via ORAL
  Filled 2019-11-18 (×3): qty 2

## 2019-11-18 MED ORDER — VITAMIN D (ERGOCALCIFEROL) 1.25 MG (50000 UNIT) PO CAPS: 50000.0000 [IU] | ORAL_CAPSULE | ORAL | Status: DC

## 2019-11-18 MED ORDER — LORAZEPAM 2 MG/ML IJ SOLN: 1.0000 mg | INTRAMUSCULAR | Status: DC

## 2019-11-18 MED ORDER — DIVALPROEX SODIUM 500 MG PO DR TAB: 500.0000 mg | DELAYED_RELEASE_TABLET | Freq: Two times a day (BID) | ORAL | 0 refills | Status: DC

## 2019-11-18 MED ORDER — LEVOTHYROXINE SODIUM 50 MCG PO TABS
50.0000 ug | ORAL_TABLET | Freq: Every day | ORAL | Status: DC
  Administered 2019-11-19: 50 ug via ORAL
  Filled 2019-11-18: qty 1

## 2019-11-18 MED ORDER — DIVALPROEX SODIUM 500 MG PO DR TAB: 1000.0000 mg | DELAYED_RELEASE_TABLET | Freq: Once | ORAL | Status: DC

## 2019-11-18 MED ORDER — DIVALPROEX SODIUM ER 500 MG PO TB24
500.0000 mg | ORAL_TABLET | Freq: Every day | ORAL | Status: DC
  Administered 2019-11-19: 10:00:00 500 mg via ORAL
  Filled 2019-11-18: qty 1

## 2019-11-18 MED ORDER — OLANZAPINE 10 MG PO TABS
10.0000 mg | ORAL_TABLET | Freq: Every day | ORAL | Status: DC
  Administered 2019-11-19: 10 mg via ORAL
  Filled 2019-11-18: qty 1

## 2019-11-18 MED ORDER — DIVALPROEX SODIUM 500 MG PO DR TAB
1000.0000 mg | DELAYED_RELEASE_TABLET | Freq: Every day | ORAL | Status: DC
  Administered 2019-11-19: 1000 mg via ORAL
  Filled 2019-11-18 (×2): qty 2

## 2019-11-18 MED ORDER — LACTATED RINGERS IV BOLUS
1000.0000 mL | Freq: Once | INTRAVENOUS | Status: AC
  Administered 2019-11-18: 1000 mL via INTRAVENOUS

## 2019-11-18 MED ORDER — LORAZEPAM 2 MG/ML IJ SOLN
1.0000 mg | INTRAMUSCULAR | Status: DC
  Administered 2019-11-19: 10:00:00 1 mg via INTRAVENOUS
  Filled 2019-11-18: qty 1

## 2019-11-18 MED ORDER — PROPRANOLOL HCL ER 60 MG PO CP24
60.0000 mg | ORAL_CAPSULE | Freq: Every day | ORAL | Status: DC
  Filled 2019-11-18: qty 1

## 2019-11-18 MED ORDER — ENOXAPARIN SODIUM 40 MG/0.4ML ~~LOC~~ SOLN: 40.0000 mg | SUBCUTANEOUS | Status: DC

## 2019-11-18 NOTE — ED Provider Notes (Signed)
[facility] Emergency Department Provider Note  ____________________________________________   First MD Initiated Contact with Patient 11/18/19 1156     (approximate)  I have reviewed the triage vital signs and the nursing notes.   HISTORY  Chief Complaint Seizures    HPI Daniel Craig is a 28 y.o. male with history of epilepsy who comes in for seizure.  Patient recently had an admission back in September and had an MRI done that was otherwise reassuring. Continue ethosuximide as well as Depakote.Neurologist changed dose of Lamictal 100 mg every morning. Continue Lamictal at 200 mg nightly.  Patient had a follow-up neurology visit on 10/08/19/2020.  No medicine changes were made at that time.  It appears that he has a few seizures a month at baseline.  Patient denies any symptoms except for some generalized aches over his whole body that are reproducible on exam.  He denies any shoulder pain.  He does not think he hit his head but does not have any current head pain.  Per triage note patient had a witnessed seizure lasting approximately 10 minutes.  Patient was postictal upon EMS arrival and improved.  The seizure lasted approximately 10 minutes per friend.  And the seizure happened about 40 minutes prior to arrival.  Point-of-care glucose was normal. Pt denies SI and states mentally he is doing well.  Patient denies any fevers or any other symptoms at this time.  Seizure occurred 1 time stopped on its own unclear what brought it on.          Past Medical History:  Diagnosis Date  . Schizophrenia (HCC)   . Seizures [facility](HCC)     Patient Active Problem List   Diagnosis Date Noted  . Epilepsy (HCC) 08/07/2019  . Extremely severe auditory hallucinations 06/02/2019  . Hypoglycemia 11/09/2018  . Schizophrenia, paranoid (HCC) 02/27/2018  . Cannabis use disorder, moderate, dependence (HCC) 02/27/2018  . Tobacco use disorder 02/27/2018  . 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 Altered mental status 12/28/2017  . Fever 08/11/2017  . Acute URI 08/11/2017  . Delirium 06/25/2017  . Schizophrenia (HCC) 06/25/2017  . Seizures (HCC) 06/25/2017    Past Surgical History:  Procedure Laterality Date  . MOUTH SURGERY      Prior to Admission medications   Medication Sig Start Date End Date Taking? Authorizing Provider  Ascorbic Acid (VITAMIN C) 1000 MG tablet Take 1,000 mg by mouth daily.    [provider]  divalproex (DEPAKOTE ER) 500 MG 24 hr tablet Take 500-1,000 mg by mouth See admin instructions. 500 mg every morning and 1000 mg at bedtime    [provider]  ethosuximide (ZARONTIN) 250 MG capsule Take 1 capsule (250 mg total) by mouth 2 (two) times daily. 07/04/19 10/02/19  Charm RingsLord, Jamison Y, NP  lamoTRIgine (LAMICTAL) 100 MG tablet Take 1 tablet (100 mg total) by mouth every morning. 08/11/19   Enid Baasjie, Jude, MD  lamoTRIgine (LAMICTAL) 200 MG tablet Take 1 tablet (200 mg total) by mouth at bedtime. 08/10/19   Enid Baasjie, Jude, MD  levothyroxine (SYNTHROID) 50 MCG tablet Take 50 mcg by mouth daily before breakfast.    [provider]  LORazepam (ATIVAN) 1 MG tablet Take 1 tablet (1 mg total) by mouth 2 (two) times daily as needed for seizure (or shaking). 01/06/17   Jennye MoccasinQuigley, Brian S, MD  meloxicam (MOBIC) 15 MG tablet Take 1 tablet (15 mg total) by mouth daily as needed for pain. 11/11/18   Enid BaasKalisetti, Radhika, MD  nicotine (NICODERM CQ - DOSED IN MG/24 HOURS) 14 mg/24hr patch Place 1 patch (14 mg total) onto the skin daily. 08/11/19   Ojie, Jude, MD  OLANZapine (ZYPREXA) 10 MG tablet Take 10-20 mg by mouth daily. Take 10 mg by mouth in the morning and 20 mg by mouth at bedtime    [provider]  propranolol ER (INDERAL LA) 60 MG 24 hr capsule Take 60 mg by mouth daily.    [provider]  risperiDONE (RISPERDAL) 3 MG tablet Take 6 mg by mouth at bedtime.  10/01/16   [provider]  traZODone (DESYREL) 150  MG tablet Take 150 mg by mouth at bedtime.    [provider]  Vitamin D, Ergocalciferol, (DRISDOL) 50000 units CAPS capsule Take 50,000 Units by mouth every 7 (seven) days.    [provider]    Allergies Keppra [levetiracetam], Trileptal [oxcarbazepine], and Vimpat [lacosamide]  Family History  Family history unknown: Yes    Social History Social History   Tobacco Use  . Smoking status: Current Every Day Smoker    Packs/day: 1.00    Types: Cigarettes  . Smokeless tobacco: Never Used  Substance Use Topics  . Alcohol use: Yes    Alcohol/week: 4.0 standard drinks    Types: 4 Shots of liquor per week    Comment: pt states 4 shots of liqour oer week  . Drug use: Yes    Types: Marijuana      Review of Systems Constitutional: No fever/chills, positive for muscle aches Eyes: No visual changes. ENT: No sore throat. Cardiovascular: Positive reproducible chest discomfort Respiratory: Denies shortness of breath. Gastrointestinal: No abdominal pain.  No nausea, no vomiting.  No diarrhea.  No constipation. Genitourinary: Negative for dysuria. Musculoskeletal: Negative for back pain. Skin: Negative for rash. Neurological: Negative for headaches, focal weakness or numbness.  Positive seizure All other ROS negative ____________________________________________   PHYSICAL EXAM:  VITAL SIGNS: ED Triage Vitals  Enc Vitals Group     BP 11/18/19 1143 110/72     Pulse Rate 11/18/19 1143 82     Resp 11/18/19 1143 15     Temp 11/18/19 1143 98 F (36.7 C)     Temp Source 11/18/19 1143 Oral     SpO2 11/18/19 1143 99 %     Weight 11/18/19 1141 229 lb 15 oz (104.3 kg)     Height 11/18/19 1141 6' (1.829 m)     Head Circumference --      Peak Flow --      Pain Score 11/18/19 1141 0     Pain Loc --      Pain Edu? --      Excl. in [place]? --     Constitutional: Alert and oriented. Well appearing and in no acute distress. Eyes: Conjunctivae are normal. EOMI. Head:  Atraumatic. Nose: No congestion/rhinnorhea. Mouth/Throat: Mucous membranes are moist.   Neck: No stridor. Trachea Midline. FROM.  No C-spine tenderness. Cardiovascular: Normal rate, regular rhythm. Grossly normal heart sounds.  Good peripheral circulation. Respiratory: Normal respiratory effort.  No retractions. Lungs CTAB. Gastrointestinal: Soft and nontender. No distention. No abdominal bruits.  Musculoskeletal: No lower extremity tenderness nor edema.  No joint effusions.  No tenderness over his shoulders. Neurologic:  Normal speech and language. No gross focal neurologic deficits are appreciated.  Cranial nerves II through XII are intact.  Equal strength in his arms and legs. Skin:  Skin is warm, dry and intact. No rash noted. Psychiatric: Flat affect  appears to be at his baseline.  Denies SI.  Mental he states he feels well. GU: Deferred   ____________________________________________   LABS (all labs ordered are listed, but only abnormal results are displayed)  Labs Reviewed  COMPREHENSIVE METABOLIC PANEL - Abnormal; Notable for the following components:      Result Value   CO2 14 (*)    Glucose, Bld 132 (*)    Anion gap 17 (*)    All other components within normal limits  VALPROIC ACID LEVEL - Abnormal; Notable for the following components:   Valproic Acid Lvl <10 (*)    All other components within normal limits  CBC WITH DIFFERENTIAL/PLATELET  MAGNESIUM  URINALYSIS, ROUTINE W REFLEX MICROSCOPIC  LAMOTRIGINE LEVEL  TROPONIN I (HIGH SENSITIVITY)  TROPONIN I (HIGH SENSITIVITY)   ____________________________________________   ED ECG REPORT I, Concha Se, the attending physician, personally viewed and interpreted this ECG.  EKG is a rate of 85 with some ST elevation in 1 and aVL which I think is more likely artifact in nature.  Does have some T wave inversions in 3 and aVF.  Normal intervals otherwise.  Will get repeat EKG.  EKG is normal sinus rate of 71, no ST  elevation, T wave inversion in aVL, normal intervals ____________________________________________  RADIOLOGY None  ____________________________________________   PROCEDURES  Procedure(s) performed (including Critical Care):  Procedures   ____________________________________________   INITIAL IMPRESSION / ASSESSMENT AND PLAN / ED COURSE  Daniel Craig was evaluated in Emergency Department on 11/18/2019 for the symptoms described in the history of present illness. He was evaluated in the context of the global COVID-19 pandemic, which necessitated consideration that the patient might be at risk for infection with the SARS-CoV-2 virus that causes COVID-19. Institutional protocols and algorithms that pertain to the evaluation of patients at risk for COVID-19 are in a state of rapid change based on information released by regulatory bodies including the CDC and federal and state organizations. These policies and algorithms were followed during the patient's care in the ED.    Patient has a history of epilepsy and presents with one of his typical seizures.  Given patient's postictal period was not prolonged and there is no evidence of trauma do not think head CT is warranted.  Patient denies any headache and he is acting appropriately at his baseline.  Will get his anticonvulsant drug levels as well as basic labs to evaluate for electrolyte abnormalities.  Given patient does have some chest discomfort get a troponin as well.  He denies any infectious symptoms.  He states he takes the bubble packs for his medicine as he does not think he is missed any medications.  This is most likely just one of his typical break through seizure, we will continue to closely monitor patient.   Labs are reassuring.  Anion gap slightly elevated but more likely secondary to the seizure given he is very well-appearing no evidence of tachycardia or vital sign abnormality.  2:19 PM repeat neuro exam is also  reassuring.  Patient's Depakote level was completely undetectable.  Patient gets his medicines his bubble packs but he said that he did notice a change to them.  I informed patient that he has not been taking his valproate which is most likely the cause of his seizures.  We will give him a loading dose with pharmacy's recommendation 1000 mg IV.  We will give him a few days of the valproate in case he does not have a  current prescription but he understands he needs to carefully check his medications to ensure that he does not actually take double.  Patient expressed understanding.  I did also call his family and updated them who also are understanding of the situation.  Initial EKG had a lot of artifact but triage doctor ordered a troponin.  This was less than 2.  I have low suspicion for ACS given patient's not have any chest pain currently and all of his discomfort was more reproducible pain all over his body.  Patient has a ride coming and does not want to stay for repeat cardiac marker which I think is safe given my very low suspicion for ACS  He denies any urinary symptoms and would like to leave without UA resulting  I discussed the provisional nature of ED diagnosis, the treatment so far, the ongoing plan of care, follow up appointments and return precautions with the patient and any family or support people present. They expressed understanding and agreed with the plan, discharged home.   ____________________________________________   FINAL CLINICAL IMPRESSION(S) / ED DIAGNOSES   Final diagnoses:  Seizure (HCC)      MEDICATIONS GIVEN DURING THIS VISIT:  Medications  valproate (DEPACON) 1,000 mg in dextrose 5 % 50 mL IVPB (1,000 mg Intravenous New Bag/Given 11/18/19 1430)     ED Discharge Orders         Ordered    divalproex (DEPAKOTE) 500 MG DR tablet  2 times daily     11/18/19 1422           Note:  This document was prepared using Dragon voice recognition software and  may include unintentional dictation errors.   Concha Se, MD 11/18/19 [phone]

## 2019-11-18 NOTE — ED Notes (Signed)
Attempted to call report to floor, secretary reports the RN that is taking this pt has not had time to read hand off and asked for me to call back in a few mins

## 2019-11-18 NOTE — ED Notes (Signed)
Legal gaurdian contacted about discharge of pt by this rn.

## 2019-11-18 NOTE — ED Notes (Signed)
Pt climbed out of end of stretcher. Pt redirected by RN to get back into bed. Pt actions appear to be that of someone intoxicated, pt giggling and unsteady on feet. Pt used urinal prior to getting back into bed.

## 2019-11-18 NOTE — ED Notes (Signed)
Seizure pads placed at bedside for patient safety, pt provided with 2 warm blankets for comfort. Pt visualized in NAD, appears drowsy at this time.

## 2019-11-18 NOTE — Discharge Instructions (Addendum)
Patient had a seizure most likely secondary to his Depakote levels not being detectable. Lamictal level still pending. He needs to go through his medications to make sure he is getting the bubble packs of the Depakote.  If not he needs to call the pharmacy.  In case they do not have an active prescription I have written a prescription for 5 days until he can get follow-up with a neurologist.  I only did a few days because I do not want him to actually take double Depakote doses.  His lamictal level is still pending.  But we need to make sure he is still taking that as well.  Return to the ER for any other concerns

## 2019-11-18 NOTE — ED Provider Notes (Signed)
[facility] Emergency Department Provider Note   ____________________________________________   First MD Initiated Contact with Patient 11/18/19 1700     (approximate)  I have reviewed the triage vital signs and the nursing notes.   HISTORY  Chief Complaint Seizures    HPI Daniel Craig is a 28 y.o. male with past medical history of schizophrenia and seizures presents to the ED following a seizure.  Patient was evaluated in the ED earlier today for seizure and was given a Depakote load, subsequently discharged after he did not have any recurrent seizures during extended period of observation.  While waiting for a ride in the waiting room, staff heard a thud and found patient on the ground having a generalized tonic-clonic seizure.  Seizure episode lasted for about 1 minute, after which patient was brought back to a room.  He appears postictal currently and is unable to provide any history.        Past Medical History:  Diagnosis Date  . Schizophrenia (HCC)   . Seizures [facility])     Patient Active Problem List   Diagnosis Date Noted  . Acquired hypothyroidism 11/18/2019  . Hyperkalemia 11/18/2019  . Recurrent seizures (HCC) 08/07/2019  . Extremely severe auditory hallucinations 06/02/2019  . Hypoglycemia 11/09/2018  . Schizophrenia, paranoid (HCC) 02/27/2018  . Cannabis use disorder, moderate, dependence (HCC) 02/27/2018  . Tobacco use disorder 02/27/2018  . 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 Altered mental status 12/28/2017  . Fever 08/11/2017  . Acute URI 08/11/2017  . Seizures (HCC) 06/25/2017    Past Surgical History:  Procedure Laterality Date  . MOUTH SURGERY      Prior to Admission medications   Medication Sig Start Date End Date Taking? Authorizing Provider  Ascorbic Acid (VITAMIN C) 1000 MG tablet Take 1,000 mg by mouth daily.   Yes [provider]  divalproex (DEPAKOTE ER) 500 MG 24 hr tablet Take 500-1,000 mg by mouth  See admin instructions. 500 mg every morning and 1000 mg at bedtime   Yes [provider]  divalproex (DEPAKOTE) 500 MG DR tablet Take 1 tablet (500 mg total) by mouth 2 (two) times daily for 5 days. 11/18/19 11/23/19 Yes Concha Se, MD  lamoTRIgine (LAMICTAL) 100 MG tablet Take 1 tablet (100 mg total) by mouth every morning. 08/11/19  Yes Ojie, Jude, MD  lamoTRIgine (LAMICTAL) 200 MG tablet Take 1 tablet (200 mg total) by mouth at bedtime. 08/10/19  Yes Ojie, Jude, MD  levothyroxine (SYNTHROID) 50 MCG tablet Take 50 mcg by mouth daily before breakfast.   Yes [provider]  OLANZapine (ZYPREXA) 10 MG tablet Take 10-20 mg by mouth daily. Take 10 mg by mouth in the morning and 20 mg by mouth at bedtime   Yes [provider]  propranolol ER (INDERAL LA) 60 MG 24 hr capsule Take 60 mg by mouth daily.   Yes [provider]  traZODone (DESYREL) 150 MG tablet Take 150 mg by mouth at bedtime.   Yes [provider]  Vitamin D, Ergocalciferol, (DRISDOL) 50000 units CAPS capsule Take 50,000 Units by mouth every 7 (seven) days.   Yes [provider]  ethosuximide (ZARONTIN) 250 MG capsule Take 1 capsule (250 mg total) by mouth 2 (two) times daily. 07/04/19 10/02/19  Charm Rings, NP  nicotine (NICODERM CQ - DOSED IN MG/24 HOURS) 14 mg/24hr patch Place 1 patch (14 mg total) onto the skin daily. 08/11/19   Jama Flavors, MD  Allergies Keppra [levetiracetam], Trileptal [oxcarbazepine], and Vimpat [lacosamide]  Family History  Family history unknown: Yes    Social History Social History   Tobacco Use  . Smoking status: Current Every Day Smoker    Packs/day: 1.00    Types: Cigarettes  . Smokeless tobacco: Never Used  Substance Use Topics  . Alcohol use: Yes    Alcohol/week: 4.0 standard drinks    Types: 4 Shots of liquor per week    Comment: pt states 4 shots of liqour oer week  . Drug use: Yes    Types: Marijuana    Review of Systems Unable  to obtain secondary to postictal state  ____________________________________________   PHYSICAL EXAM:  VITAL SIGNS: ED Triage Vitals  Enc Vitals Group     BP      Pulse      Resp      Temp      Temp src      SpO2      Weight      Height      Head Circumference      Peak Flow      Pain Score      Pain Loc      Pain Edu?      Excl. in [place]?     Constitutional: Alert and confused, nonverbal Eyes: Conjunctivae are normal. Head: Small abrasion to frontal scalp. Nose: No congestion/rhinnorhea. Mouth/Throat: Mucous membranes are moist. Neck: Normal ROM Cardiovascular: Normal rate, regular rhythm. Grossly normal heart sounds. Respiratory: Normal respiratory effort.  No retractions. Lungs CTAB. Gastrointestinal: Soft and nontender. No distention. Genitourinary: deferred Musculoskeletal: No lower extremity tenderness nor edema. Neurologic: Moving all extremities equally, not following commands.  Currently nonverbal. Skin:  Skin is warm, dry and intact. No rash noted.  ____________________________________________   LABS (all labs ordered are listed, but only abnormal results are displayed)  Labs Reviewed  CBC WITH DIFFERENTIAL/PLATELET - Abnormal; Notable for the following components:      Result Value   WBC 12.0 (*)    Lymphs Abs 5.7 (*)    Monocytes Absolute 1.3 (*)    All other components within normal limits  BASIC METABOLIC PANEL - Abnormal; Notable for the following components:   Potassium 5.4 (*)    CO2 12 (*)    Glucose, Bld 107 (*)    Anion gap 19 (*)    All other components within normal limits  SARS CORONAVIRUS 2 (TAT 6-24 HRS)  VALPROIC ACID LEVEL  HIV ANTIBODY (ROUTINE TESTING W REFLEX)  VALPROIC ACID LEVEL  CBC  BASIC METABOLIC PANEL  BASIC METABOLIC PANEL     PROCEDURES  Procedure(s) performed (including Critical Care):  Procedures   ____________________________________________   INITIAL IMPRESSION / ASSESSMENT AND PLAN / ED COURSE         28 year old male with history of skin of hernia and seizure disorder presents to the ED following seizure episode witnessed by staff in the ED waiting room.  Episode sounds typical for his usual tonic-clonic seizures and lasted for only about 1 minute.  On arrival to the room, he remains postictal and confused, but no obvious focal deficits.  We will give a dose of Ativan and continue to monitor, check CT head given trauma.  Reviewed lab work from earlier that was unremarkable, will recheck to ensure no changes.  He was loaded with Depakote earlier today after being found to have subtherapeutic levels.  Unfortunately he has allergies to multiple antiepileptic medications, will discuss with neurology  appropriate medication for loading.  Case discussed with teleneurologist, and given that patient has multiple medication allergies as well as already being on multiple seizure medications, will treat with scheduled Ativan for now.  Patient to receive 1 mg Ativan every 4 hours until evaluated by neurology tomorrow.  Phenytoin has potential for interaction with Depakote, therefore would avoid phenytoin load.  CT head was negative for acute process and labs are unremarkable, case discussed with hospitalist who accepts patient for admission.      ____________________________________________   FINAL CLINICAL IMPRESSION(S) / ED DIAGNOSES  Final diagnoses:  Seizures [facility](HCC)     ED Discharge Orders    None       Note:  This document was prepared using Dragon voice recognition software and may include unintentional dictation errors.   Chesley NoonJessup, Saumya Hukill, MD 11/18/19 2356

## 2019-11-18 NOTE — ED Triage Notes (Signed)
Pt presents to ED from home with c/o witnessed seizure lasting approx 10 mins. Per EMS pt remains somewhat post-ictal, upon EMS arrival GCS 13, however en route GCS improved to 14. Per EMS pt's friends reported seizure-like activity lasting approx 10 mins. EMS reports seizure happened approx 70mins ago.    118/70 70 14 98% RA CBG 120 GCS 14     Upon arrival pt alert, oriented to place, situation, disoriented to month.

## 2019-11-18 NOTE — ED Notes (Signed)
Daniel Craig [phone], please call with update.

## 2019-11-18 NOTE — H&P (Signed)
History and Physical    Daniel Craig EHU:[id number] DOB: 08-May-1991 DOA: 11/18/2019   PCP: [facility]   Patient coming from: Home  Chief Complaint: Seizure  HPI: Daniel Craig is a 28 y.o. male with medical history significant for seizure disorder, hypothyroidism, cannabis use and schizophrenia, last hospitalized in September for breakthrough seizure presented to the emergency room earlier today with a witnessed seizure lasted about 1 minute.  He was treated in the emergency room and discharged.  Awaiting discharge, he had another witnessed seizure and was brought back to the emergency room.  He was loaded with Depakote in the emergency room.  Depakote level returned less than 10.  He had no other significant abnormal lab values except for slightly elevated white cell count of 12,000.  CT scan of the head was negative.  Patient stated that he is compliant with his Depakote and has not missed doses.  He lists several allergies to antiepileptic drugs including Vimpat, Trileptal, Keppra and Topamax  TeleNeurology consult was done by ER provider recommendation for scheduled IV Ativan 1 mg IV every 4 until neurology assessment in the a.m.  On my evaluation, patient was awake and alert.  He complains of a slight headache from hitting his head.  He otherwise voices no complaints.  No visual disturbance, nausea or vomiting or weakness or tingling in his extremities or face.  He states he is compliant with all his medications.  Patient will be admitted for observation    ED Course:On arrival in the ED  Review of Systems: As presented in HPI.  Otherwise 10 point review of systems negative  General: no fevers, chills, changes in weight, appetite HEENT: no blurry vision, hearing changes or sore throat Respiratory: no dyspnea, coughing, wheezing CV: no chest pain, no palpitations GI: no nausea, vomiting, abdominal pain, diarrhea, constipation GU: no dysuria, burning on urination,  increased urinary frequency, hematuria  Ext: no leg edemaNeuro: no unilateral weakness, numbness, or tingling, no vision change or hearing loss Skin: no rash, no skin tear., ulcers Musculoskeletal: No muscle spasm, no deformity, no limitation in range of movement Hematologic: No easy bruising., enlarged lymph nodes  Neurologic:   Allergy:  Allergies  Allergen Reactions  . Keppra [Levetiracetam]   . Trileptal [Oxcarbazepine]   . Vimpat [Lacosamide]     Past Medical History:  Diagnosis Date  . Schizophrenia (HCC)   . Seizures (HCC)     Past Surgical History:  Procedure Laterality Date  . MOUTH SURGERY      Social History:  reports that he has been smoking cigarettes. He has been smoking about 1.00 pack per day. He has never used smokeless tobacco. He reports current alcohol use of about 4.0 standard drinks of alcohol per week. He reports current drug use. Drug: Marijuana.  Family History:  Family History  Family history unknown: Yes     Prior to Admission medications   Medication Sig Start Date End Date Taking? Authorizing Provider  Ascorbic Acid (VITAMIN C) 1000 MG tablet Take 1,000 mg by mouth daily.   Yes [provider]  divalproex (DEPAKOTE ER) 500 MG 24 hr tablet Take 500-1,000 mg by mouth See admin instructions. 500 mg every morning and 1000 mg at bedtime   Yes [provider]  divalproex (DEPAKOTE) 500 MG DR tablet Take 1 tablet (500 mg total) by mouth 2 (two) times daily for 5 days. 11/18/19 11/23/19 Yes Concha Se, MD  lamoTRIgine (LAMICTAL) 100 MG tablet Take 1 tablet (  100 mg total) by mouth every morning. 08/11/19  Yes Ojie, Jude, MD  lamoTRIgine (LAMICTAL) 200 MG tablet Take 1 tablet (200 mg total) by mouth at bedtime. 08/10/19  Yes Ojie, Jude, MD  levothyroxine (SYNTHROID) 50 MCG tablet Take 50 mcg by mouth daily before breakfast.   Yes [provider]  OLANZapine (ZYPREXA) 10 MG tablet Take 10-20 mg by mouth daily. Take 10 mg by mouth  in the morning and 20 mg by mouth at bedtime   Yes [provider]  propranolol ER (INDERAL LA) 60 MG 24 hr capsule Take 60 mg by mouth daily.   Yes [provider]  traZODone (DESYREL) 150 MG tablet Take 150 mg by mouth at bedtime.   Yes [provider]  Vitamin D, Ergocalciferol, (DRISDOL) 50000 units CAPS capsule Take 50,000 Units by mouth every 7 (seven) days.   Yes [provider]  ethosuximide (ZARONTIN) 250 MG capsule Take 1 capsule (250 mg total) by mouth 2 (two) times daily. 07/04/19 10/02/19  Charm RingsLord, Jamison Y, NP  nicotine (NICODERM CQ - DOSED IN MG/24 HOURS) 14 mg/24hr patch Place 1 patch (14 mg total) onto the skin daily. 08/11/19   Jama Flavorsjie, Jude, MD    Physical Exam: Vitals:   11/18/19 1743 11/18/19 1830 11/18/19 1948 11/18/19 2050  BP: (!) 91/49 105/69 126/87   Pulse:   74   Resp:  15 18   Temp:    98.1 F (36.7 C)  TempSrc:    Oral  SpO2:      Weight:      Height:       General: Not in acute distress, alert and oriented x 3 HEENT:       Eyes: PERRL, EOMI, no scleral icterus.       ENT: No discharge from the ears and nose, no pharynx injection, no tonsillar enlargement.  Neck: No JVD, no bruit, no mass felt.no thyromegaly, adenopathy Heme: No lymph node enlargement. Cardiac: S1/S2, RRR, No murmurs, No gallops or rubs. Respiratory: Good air movement bilaterally. No rales, wheezing, rhonchi or rubs. GI: Soft, non distended, non tender, no organomegaly, BS present. GU: No hematuria Ext: No pitting leg edema bilaterally. 2+DP/PT pulse bilaterally. Musculoskeletal: No joint deformities, No joint redness or warmth, no limitation of ROM Skin: No rashes, ulcers, induration.  Neuro: Alert, oriented X3, cranial nerves II-XII grossly intact, moves all extremities normally. Muscle strength 5/5 in all extremities, sensation to light touch intact. Brachial reflex 2+ bilaterally. Knee reflex 1+ bilaterally. Negative Babinski's sign. Normal finger to nose  test. Psych: good judgement and insight. No suicidal ideation  Labs on Admission: I have personally reviewed following labs and imaging studies  CBC: Recent Labs  Lab 11/18/19 1141 11/18/19 1714  WBC 5.2 12.0*  NEUTROABS 2.1 4.9  HGB 14.3 14.9  HCT 41.8 44.5  MCV 89.9 90.1  PLT 211 209   Basic Metabolic Panel: Recent Labs  Lab 11/18/19 1141 11/18/19 1714  NA 137 137  K 3.8 5.4*  CL 106 106  CO2 14* 12*  GLUCOSE 132* 107*  BUN 12 12  CREATININE 1.08 1.21  CALCIUM 9.7 9.3  MG 1.7  --    GFR: Estimated Creatinine Clearance: 113.5 mL/min (by C-G formula based on SCr of 1.21 mg/dL). Liver Function Tests: Recent Labs  Lab 11/18/19 1141  AST 29  ALT 23  ALKPHOS 41  BILITOT 0.9  PROT 7.6  ALBUMIN 4.2   No results for input(s): LIPASE, AMYLASE in the last 168  hours. No results for input(s): AMMONIA in the last 168 hours. Coagulation Profile: No results for input(s): INR, PROTIME in the last 168 hours. Cardiac Enzymes: No results for input(s): CKTOTAL, CKMB, CKMBINDEX, TROPONINI in the last 168 hours. BNP (last 3 results) No results for input(s): PROBNP in the last 8760 hours. HbA1C: No results for input(s): HGBA1C in the last 72 hours. CBG: No results for input(s): GLUCAP in the last 168 hours. Lipid Profile: No results for input(s): CHOL, HDL, LDLCALC, TRIG, CHOLHDL, LDLDIRECT in the last 72 hours. Thyroid Function Tests: No results for input(s): TSH, T4TOTAL, FREET4, T3FREE, THYROIDAB in the last 72 hours. Anemia Panel: No results for input(s): VITAMINB12, FOLATE, FERRITIN, TIBC, IRON, RETICCTPCT in the last 72 hours. Urine analysis:    Component Value Date/Time   COLORURINE YELLOW (A) 08/06/2019 1927   APPEARANCEUR CLEAR (A) 08/06/2019 1927   LABSPEC 1.020 08/06/2019 1927   PHURINE 7.0 08/06/2019 1927   GLUCOSEU NEGATIVE 08/06/2019 1927   HGBUR NEGATIVE 08/06/2019 1927   BILIRUBINUR NEGATIVE 08/06/2019 1927   KETONESUR 80 (A) 08/06/2019 1927    PROTEINUR NEGATIVE 08/06/2019 1927   NITRITE NEGATIVE 08/06/2019 1927   LEUKOCYTESUR NEGATIVE 08/06/2019 1927   Sepsis Labs: @LABRCNTIP (procalcitonin:4,lacticidven:4) )No results found for this or any previous visit (from the past 240 hour(s)).   Radiological Exams on Admission: CT Head Wo Contrast  Result Date: 11/18/2019 CLINICAL DATA:  Seizure.  Headache.  Head trauma. EXAM: CT HEAD WITHOUT CONTRAST TECHNIQUE: Contiguous axial images were obtained from the base of the skull through the vertex without intravenous contrast. COMPARISON:  08/08/2019 head CT. FINDINGS: Brain: No evidence of parenchymal hemorrhage or extra-axial fluid collection. No mass lesion, mass effect, or midline shift. No CT evidence of acute infarction. Cerebral volume is age appropriate. No ventriculomegaly. Vascular: No acute abnormality. Skull: No evidence of calvarial fracture. Sinuses/Orbits: Complete opacification of the visualized right maxillary sinus, unchanged. Mucoperiosteal thickening and opacification of the anterior right ethmoidal air cells. Complete right frontal sinus opacification. No fluid levels. Other:  The mastoid air cells are unopacified. IMPRESSION: 1. No evidence of acute intracranial abnormality. No evidence of calvarial fracture. 2. Prominent chronic right paranasal sinusitis. Electronically Signed   By: Ilona Sorrel M.D.   On: 11/18/2019 17:58     EKG: Independently reviewed.  Not done in ED, will get one.   Assessment/Plan    Recurrent seizures ([place]) --Admit to observation --Seizure and fall precautions.  Neurologic checks every 4 --Received a Depakote load in the emergency room. DepakoteLevel was less than 10 --follow up repeat level in the am --IV Ativan 1 mg every 4 per recommendation of telemetry neurologist --Neurology consult in the a.m. --Patient is allergic to several antiepileptic drugs, will await neurology consult for further recommendations.  Schizophrenia --Patient unable  to verify medication.   --To Resume home meds once med rec done --Behavioral problems at this time    Acquired hypothyroidism --Get TSH level and free T4  Hyperkalemia --Potassium was 5.4, likely related to seizure --Recheck potassium  Marijuana use, moderate dependence --Counseled on quitting      DVT prophylaxis: Lovenox  code Status: Full code Disposition Plan:  Anticipate discharge back to previous home environment Consults called: Neurology Admission status: Observation  Date of Service 11/18/2019    [facility]   If 7PM-7AM, please contact night-coverage [email or web address] Password [facility] 11/18/2019, 8:56 PM

## 2019-11-18 NOTE — ED Triage Notes (Signed)
Waiting in lobby when first nurse noticed pt was having seizure pt brought back from lobby and currently postictal. Assessed by MD t this time

## 2019-11-18 NOTE — ED Notes (Signed)
Report given to floor RN

## 2019-11-19 DIAGNOSIS — G40909 Epilepsy, unspecified, not intractable, without status epilepticus: Secondary | ICD-10-CM | POA: Diagnosis not present

## 2019-11-19 DIAGNOSIS — U071 COVID-19: Secondary | ICD-10-CM

## 2019-11-19 LAB — BASIC METABOLIC PANEL
Anion gap: 10 (ref 5–15)
Anion gap: 11 (ref 5–15)
BUN: 9 mg/dL (ref 6–20)
BUN: 8 mg/dL (ref 6–20)
CO2: 22 mmol/L (ref 22–32)
CO2: 24 mmol/L (ref 22–32)
Calcium: 9.4 mg/dL (ref 8.9–10.3)
Calcium: 9.4 mg/dL (ref 8.9–10.3)
Chloride: 104 mmol/L (ref 98–111)
Chloride: 106 mmol/L (ref 98–111)
Creatinine, Ser: 0.91 mg/dL (ref 0.61–1.24)
Creatinine, Ser: 0.99 mg/dL (ref 0.61–1.24)
GFR calc Af Amer: >60 mL/min (ref >60)
GFR calc Af Amer: >60 mL/min (ref >60)
GFR calc non Af Amer: >60 mL/min (ref >60)
GFR calc non Af Amer: >60 mL/min (ref >60)
Glucose, Bld: 101 mg/dL — ABNORMAL HIGH (ref 70–99)
Glucose, Bld: 87 mg/dL (ref 70–99)
Potassium: 3.6 mmol/L (ref 3.5–5.1)
Potassium: 3.8 mmol/L (ref 3.5–5.1)
Sodium: 136 mmol/L (ref 135–145)
Sodium: 141 mmol/L (ref 135–145)

## 2019-11-19 LAB — CBC
HCT: 42.1 % (ref 39.0–52.0)
Hemoglobin: 15 g/dL (ref 13.0–17.0)
MCH: 30.1 pg (ref 26.0–34.0)
MCHC: 35.6 g/dL (ref 30.0–36.0)
MCV: 84.4 fL (ref 80.0–100.0)
Platelets: 212 10*3/uL (ref 150–400)
RBC: 4.99 MIL/uL (ref 4.22–5.81)
RDW: 12.5 % (ref 11.5–15.5)
WBC: 7.9 10*3/uL (ref 4.0–10.5)
nRBC: 0 % (ref 0.0–0.2)

## 2019-11-19 LAB — C-REACTIVE PROTEIN: CRP: 0.6 mg/dL (ref <1.0)

## 2019-11-19 LAB — FERRITIN: Ferritin: 66 ng/mL (ref 24–336)

## 2019-11-19 LAB — PROCALCITONIN: Procalcitonin: <0.1 ng/mL

## 2019-11-19 LAB — LAMOTRIGINE LEVEL: Lamotrigine Lvl: 6.4 ug/mL (ref 2.0–20.0)

## 2019-11-19 LAB — SARS CORONAVIRUS 2 (TAT 6-24 HRS): SARS Coronavirus 2: POSITIVE — AB

## 2019-11-19 LAB — VALPROIC ACID LEVEL: Valproic Acid Lvl: 103 ug/mL — ABNORMAL HIGH (ref 50.0–100.0)

## 2019-11-19 LAB — BRAIN NATRIURETIC PEPTIDE: B Natriuretic Peptide: 16 pg/mL (ref 0.0–100.0)

## 2019-11-19 LAB — LACTATE DEHYDROGENASE: LDH: 129 U/L (ref 98–192)

## 2019-11-19 LAB — HIV ANTIBODY (ROUTINE TESTING W REFLEX): HIV Screen 4th Generation wRfx: NONREACTIVE

## 2019-11-19 LAB — FIBRIN DERIVATIVES D-DIMER (ARMC ONLY): Fibrin derivatives D-dimer (ARMC): 418.12 ng/mL (FEU) (ref 0.00–499.00)

## 2019-11-19 MED ORDER — DIVALPROEX SODIUM ER 250 MG PO TB24: 750.0000 mg | ORAL_TABLET | Freq: Two times a day (BID) | ORAL | 0 refills | Status: DC

## 2019-11-19 MED ORDER — ZINC SULFATE 220 (50 ZN) MG PO CAPS
220.0000 mg | ORAL_CAPSULE | Freq: Every day | ORAL | Status: DC
  Filled 2019-11-19: qty 1

## 2019-11-19 MED ORDER — DIVALPROEX SODIUM ER 500 MG PO TB24: 750.0000 mg | ORAL_TABLET | Freq: Every day | ORAL | Status: DC

## 2019-11-19 MED ORDER — IPRATROPIUM-ALBUTEROL 20-100 MCG/ACT IN AERS
1.0000 | INHALATION_SPRAY | Freq: Four times a day (QID) | RESPIRATORY_TRACT | Status: DC
  Filled 2019-11-19: qty 4

## 2019-11-19 MED ORDER — HYDROCOD POLST-CPM POLST ER 10-8 MG/5ML PO SUER: 5.0000 mL | Freq: Two times a day (BID) | ORAL | Status: DC | PRN

## 2019-11-19 MED ORDER — GUAIFENESIN-DM 100-10 MG/5ML PO SYRP: 10.0000 mL | ORAL_SOLUTION | ORAL | Status: DC | PRN

## 2019-11-19 NOTE — Progress Notes (Addendum)
Nutrition Brief Note RD working remotely.  Patient identified on the Malnutrition Screening Tool (MST) Report  Wt Readings from Last 15 Encounters:  11/18/19 104.3 kg  11/18/19 104.3 kg  08/11/19 104.3 kg  08/10/19 100.2 kg  08/06/19 98.9 kg  07/03/19 98.9 kg  06/02/19 94.8 kg  05/29/19 95.3 kg  05/27/19 95.7 kg  02/06/19 93 kg  11/28/18 95 kg  11/23/18 95 kg  11/11/18 95.6 kg  11/04/18 85.3 kg  08/05/18 59.86 kg    28 year old male with PMHx of schizophrenia, seizures, hypothyroidism, and cannabis use admitted with recurrent seizures.  Spoke with patient over the phone. Initially he did endorse decreased appetite and weight loss. He reports his appetite has been decreased for a while now and he is unsure why. However he reports he is still able to eat well and is eating 100% of meals. He also reports he does not need a nutrition intervention and he is "fine." He reports he has lost approximately 5 lbs over unknown time period. However according to weight history in chart patient has gained approximately 10 kg over the past 7 months.  Body mass index is 31.19 kg/m. Patient meets criteria for obesity class I based on current BMI.   Current diet order is regular, patient is consuming approximately 100% of meals at this time per patient report. Labs and medications reviewed.   No nutrition interventions warranted at this time. If nutrition issues arise, please consult RD.   Jacklynn Barnacle, MS, RD, LDN Office: [phone] Pager: [phone] After Hours/Weekend Pager: [phone]

## 2019-11-19 NOTE — Consult Note (Signed)
Reason for Consult: seizure  Referring Physician: Dr. Reesa Chew   CC: seizure activity   HPI: Daniel Craig is an 28 y.o. male with medical history significant for seizure disorder, hypothyroidism, cannabis use and schizophrenia, last hospitalized in September for breakthrough seizure presented to the emergency room earlier with a witnessed seizure lasted about 1 minute. Prior to d/c had another seizure episode.  On VPA 500 bid and Lamictal 100 AM and 200 at night. States compliant with medications.     Past Medical History:  Diagnosis Date  . Schizophrenia ([place])   . Seizures ([place])     Past Surgical History:  Procedure Laterality Date  . MOUTH SURGERY      Family History  Family history unknown: Yes    Social History:  reports that he has been smoking cigarettes. He has been smoking about 1.00 pack per day. He has never used smokeless tobacco. He reports current alcohol use of about 4.0 standard drinks of alcohol per week. He reports current drug use. Drug: Marijuana.  Allergies  Allergen Reactions  . Keppra [Levetiracetam]   . Trileptal [Oxcarbazepine]   . Vimpat [Lacosamide]     Medications: I have reviewed the patient's current medications.  ROS: Sleepy and difficult to obtain   Physical Examination: Blood pressure (!) 123/53, pulse (!) 51, temperature 98.4 F (36.9 C), temperature source Oral, resp. rate 15, height 6' (1.829 m), weight 104.3 kg, SpO2 97 %.    Neurological Examination   Mental Status: Alert, oriented to name and location  Cranial Nerves: II: Discs flat bilaterally; Visual fields grossly normal, pupils equal, round, reactive to light and accommodation III,IV, VI: ptosis not present, extra-ocular motions intact bilaterally V,VII: smile symmetric, facial light touch sensation normal bilaterally VIII: hearing normal bilaterally XI: bilateral shoulder shrug XII: midline tongue extension Motor: Generalized weakness  Tone and bulk:normal tone  throughout; no atrophy noted Sensory: Pinprick and light touch intact throughout, bilaterally Deep Tendon Reflexes: 1+ and symmetric throughout Plantars: Right: downgoing   Left: downgoing Cerebellar: normal finger-to-nose Gait: not tested      Laboratory Studies:   Basic Metabolic Panel: Recent Labs  Lab 11/18/19 1141 11/18/19 1714 11/19/19 0127 11/19/19 0504  NA 137 137 136 141  K 3.8 5.4* 3.6 3.8  CL 106 106 104 106  CO2 14* 12* 22 24  GLUCOSE 132* 107* 101* 87  BUN 12 12 9 8   CREATININE 1.08 1.21 0.91 0.99  CALCIUM 9.7 9.3 9.4 9.4  MG 1.7  --   --   --     Liver Function Tests: Recent Labs  Lab 11/18/19 1141  AST 29  ALT 23  ALKPHOS 41  BILITOT 0.9  PROT 7.6  ALBUMIN 4.2   No results for input(s): LIPASE, AMYLASE in the last 168 hours. No results for input(s): AMMONIA in the last 168 hours.  CBC: Recent Labs  Lab 11/18/19 1141 11/18/19 1714 11/19/19 0504  WBC 5.2 12.0* 7.9  NEUTROABS 2.1 4.9  --   HGB 14.3 14.9 15.0  HCT 41.8 44.5 42.1  MCV 89.9 90.1 84.4  PLT 211 209 212    Cardiac Enzymes: No results for input(s): CKTOTAL, CKMB, CKMBINDEX, TROPONINI in the last 168 hours.  BNP: Invalid input(s): POCBNP  CBG: No results for input(s): GLUCAP in the last 168 hours.  Microbiology: Results for orders placed or performed during the hospital encounter of 11/18/19  SARS CORONAVIRUS 2 (TAT 6-24 HRS) Nasopharyngeal Nasopharyngeal Swab     Status: Abnormal  Collection Time: 11/18/19  9:46 PM   Specimen: Nasopharyngeal Swab  Result Value Ref Range Status   SARS Coronavirus 2 POSITIVE (A) NEGATIVE Final    Comment: RESULT CALLED TO, READ BACK BY AND VERIFIED WITH: G. Zipf DC 10:45 11/19/19 ([email or web address]) (NOTE) SARS-CoV-2 target nucleic acids are DETECTED. The SARS-CoV-2 RNA is generally detectable in upper and lower respiratory specimens during the acute phase of infection. Positive results are indicative of the presence of SARS-CoV-2 RNA.  Clinical correlation with patient history and other diagnostic information is  necessary to determine patient infection status. Positive results do not rule out bacterial infection or co-infection with other viruses.  The expected result is Negative. Fact Sheet for Patients: HairSlick.nohttps://www.fda.gov/media/138098/download Fact Sheet for Healthcare Providers: quierodirigir.comhttps://www.fda.gov/media/138095/download This test is not yet approved or cleared by the [place] FDA and  has been authorized for detection and/or diagnosis of SARS-CoV-2 by FDA under an Emergency Use Authorization (EUA). This EUA will remain  in effect (meaning this test can be used) for the  duration of the COVID-19 declaration under Section 564(b)(1) of the Act, 21 U.S.C. section 360bbb-3(b)(1), unless the authorization is terminated or revoked sooner. Performed at [facility], [address] [place], [place] [address]     Coagulation Studies: No results for input(s): LABPROT, INR in the last 72 hours.  Urinalysis: No results for input(s): COLORURINE, LABSPEC, PHURINE, GLUCOSEU, HGBUR, BILIRUBINUR, KETONESUR, PROTEINUR, UROBILINOGEN, NITRITE, LEUKOCYTESUR in the last 168 hours.  Invalid input(s): APPERANCEUR  Lipid Panel:     Component Value Date/Time   CHOL 128 02/28/2018 0825   TRIG 83 02/28/2018 0825   HDL 47 02/28/2018 0825   CHOLHDL 2.7 02/28/2018 0825   VLDL 17 02/28/2018 0825   LDLCALC 64 02/28/2018 0825    HgbA1C:  Lab Results  Component Value Date   HGBA1C 5.0 02/28/2018    Urine Drug Screen:      Component Value Date/Time   LABOPIA NONE DETECTED 08/06/2019 1601   COCAINSCRNUR NONE DETECTED 08/06/2019 1601   LABBENZ NONE DETECTED 08/06/2019 1601   AMPHETMU NONE DETECTED 08/06/2019 1601   THCU POSITIVE (A) 08/06/2019 1601   LABBARB NONE DETECTED 08/06/2019 1601    Alcohol Level: No results for input(s): ETH in the last 168 hours.  Other results: EKG: normal EKG, normal sinus rhythm,  unchanged from previous tracings.  Imaging: CT Head Wo Contrast  Result Date: 11/18/2019 CLINICAL DATA:  Seizure.  Headache.  Head trauma. EXAM: CT HEAD WITHOUT CONTRAST TECHNIQUE: Contiguous axial images were obtained from the base of the skull through the vertex without intravenous contrast. COMPARISON:  08/08/2019 head CT. FINDINGS: Brain: No evidence of parenchymal hemorrhage or extra-axial fluid collection. No mass lesion, mass effect, or midline shift. No CT evidence of acute infarction. Cerebral volume is age appropriate. No ventriculomegaly. Vascular: No acute abnormality. Skull: No evidence of calvarial fracture. Sinuses/Orbits: Complete opacification of the visualized right maxillary sinus, unchanged. Mucoperiosteal thickening and opacification of the anterior right ethmoidal air cells. Complete right frontal sinus opacification. No fluid levels. Other:  The mastoid air cells are unopacified. IMPRESSION: 1. No evidence of acute intracranial abnormality. No evidence of calvarial fracture. 2. Prominent chronic right paranasal sinusitis. Electronically Signed   By: Delbert PhenixJason A Poff M.D.   On: 11/18/2019 17:58     Assessment/Plan:  28 y.o. male with medical history significant for seizure disorder, hypothyroidism, cannabis use and schizophrenia, last hospitalized in September for breakthrough seizure presented to the emergency room earlier with a witnessed seizure lasted  about 1 minute. Prior to d/c had another seizure episode.  On VPA 500 bid and Lamictal 100 AM and 200 at night. States compliant with medications.    - s/p VPA load of 1 gram. He probably is only on 500 BID for mood stabilization  - Will increase it to 750 BID  - Con't home lamictal - Covid positive which could have lowered sz threshold.  - d/c planning from neurological stand point likely today 11/19/2019, 11:31 AM

## 2019-11-19 NOTE — Discharge Instructions (Addendum)

## 2019-11-19 NOTE — Progress Notes (Signed)
Patient discharged home and transported via [place] by father Jammy Plotkin

## 2019-11-19 NOTE — Discharge Summary (Signed)
Physician Discharge Summary  Daniel Craig OIZ:[id number] DOB: [birth year] DOA: 11/18/2019  PCP: [place] date: 11/18/2019 Discharge date: 11/19/2019  Admitted From: Home Disposition:  Home  Recommendations for Outpatient Follow-up:  1. Follow up with PCP in 1-2 weeks 2. Please obtain BMP/CBC in one week your next doctors visit.  3. Depakote ER increased to 750mg  po bid per Neurology 4. Home monitoring for COVID 19  Discharge Condition: Stable CODE STATUS: Full  Diet recommendation: Regular   Brief/Interim Summary: 28 year old with history of seizure, hypothyroidism, schizophrenia and cannabis use presented to the ER after a witnessed seizure lasting about a minute at home.  Had another episode in the ER therefore admitted to the hospital.  CT of the head was negative.  On lab work Depakote levels were low, CTA was negative.He was loaded with Depakote. Seen by neurology who recommended outpatient follow up with recs to increase Depakote ER to 750mg  po bid.  Indicentally he was also Positive for COVID 19 without signs of hypoxia and normal inflammatory markers. He was advised to go home with home monitoring. Family notified as well.    Discharge Diagnoses:  Principal Problem:   Recurrent seizures ([place]) Active Problems:   Schizophrenia, paranoid ([place])   Cannabis use disorder, moderate, dependence ([place])   Acquired hypothyroidism   Hyperkalemia   COVID-19 virus infection  Recurrent tonic-clonic seizures; resolved -Seizure precautions in place.  Neurochecks -Neurology consulted- rec increasing Depakote ER to 750mg  po bid.  -Follow up outpatient.   COVID 19 infection- incidental finding. No evidence of sob, hypoxia. Asymptomatic. WNL inflammatory markers. Can continue home monitoring.   History of schizophrenia -Resume home meds- Zyprexa  Hypothyroidism, acquired -Synthroid  History of marijuana use -Counseled to quit using  it   Consultations:  Neurology   Subjective: No further seizures, feels ok.   Discharge Exam: Vitals:   11/19/19 0841 11/19/19 1223  BP: (!) 123/53 122/88  Pulse: (!) 51 75  Resp: 15 16  Temp: 98.4 F (36.9 C) 99.1 F (37.3 C)  SpO2: 97% 99%   Vitals:   11/18/19 2139 11/19/19 0531 11/19/19 0841 11/19/19 1223  BP: 111/63 116/75 (!) 123/53 122/88  Pulse: (!) 59 (!) 59 (!) 51 75  Resp: 18 20 15 16   Temp: 98.8 F (37.1 C) 97.9 F (36.6 C) 98.4 F (36.9 C) 99.1 F (37.3 C)  TempSrc: Oral Oral Oral Oral  SpO2: 100% 100% 97% 99%  Weight:      Height:        General: Pt is alert, awake, not in acute distress Cardiovascular: RRR, S1/S2 +, no rubs, no gallops Respiratory: CTA bilaterally, no wheezing, no rhonchi Abdominal: Soft, NT, ND, bowel sounds + Extremities: no edema, no cyanosis  Discharge Instructions  Discharge Instructions    MYCHART COVID-19 HOME MONITORING PROGRAM   Complete by: As directed    Is the patient willing to use the [place] for home monitoring?: Yes   Temperature monitoring   Complete by: Nov 19, 2019    After how many days would you like to receive a notification of this patient's flowsheet entries?: 1     Allergies as of 11/19/2019      Reactions   Keppra [levetiracetam]    Trileptal [oxcarbazepine]    Vimpat [lacosamide]       Medication List    TAKE these medications   divalproex 250 MG 24 hr tablet Commonly known as: DEPAKOTE ER Take 3 tablets (750 mg total)  by mouth 2 (two) times daily. What changed:   medication strength  how much to take  when to take this  additional instructions  Another medication with the same name was removed. Continue taking this medication, and follow the directions you see here.   ethosuximide 250 MG capsule Commonly known as: Zarontin Take 1 capsule (250 mg total) by mouth 2 (two) times daily.   lamoTRIgine 200 MG tablet Commonly known as: LAMICTAL Take 1 tablet (200 mg  total) by mouth at bedtime.   lamoTRIgine 100 MG tablet Commonly known as: LAMICTAL Take 1 tablet (100 mg total) by mouth every morning.   levothyroxine 50 MCG tablet Commonly known as: SYNTHROID Take 50 mcg by mouth daily before breakfast.   nicotine 14 mg/24hr patch Commonly known as: NICODERM CQ - dosed in mg/24 hours Place 1 patch (14 mg total) onto the skin daily.   OLANZapine 10 MG tablet Commonly known as: ZYPREXA Take 10-20 mg by mouth daily. Take 10 mg by mouth in the morning and 20 mg by mouth at bedtime   propranolol ER 60 MG 24 hr capsule Commonly known as: INDERAL LA Take 60 mg by mouth daily.   traZODone 150 MG tablet Commonly known as: DESYREL Take 150 mg by mouth at bedtime.   vitamin C 1000 MG tablet Take 1,000 mg by mouth daily.   Vitamin D (Ergocalciferol) 1.25 MG (50000 UT) Caps capsule Commonly known as: DRISDOL Take 50,000 Units by mouth every 7 (seven) days.       Allergies  Allergen Reactions  . Keppra [Levetiracetam]   . Trileptal [Oxcarbazepine]   . Vimpat [Lacosamide]     You were cared for by a hospitalist during your hospital stay. If you have any questions about your discharge medications or the care you received while you were in the hospital after you are discharged, you can call the unit and asked to speak with the hospitalist on call if the hospitalist that took care of you is not available. Once you are discharged, your primary care physician will handle any further medical issues. Please note that no refills for any discharge medications will be authorized once you are discharged, as it is imperative that you return to your primary care physician (or establish a relationship with a primary care physician if you do not have one) for your aftercare needs so that they can reassess your need for medications and monitor your lab values.   Procedures/Studies: CT Head Wo Contrast  Result Date: 11/18/2019 CLINICAL DATA:  Seizure.   Headache.  Head trauma. EXAM: CT HEAD WITHOUT CONTRAST TECHNIQUE: Contiguous axial images were obtained from the base of the skull through the vertex without intravenous contrast. COMPARISON:  08/08/2019 head CT. FINDINGS: Brain: No evidence of parenchymal hemorrhage or extra-axial fluid collection. No mass lesion, mass effect, or midline shift. No CT evidence of acute infarction. Cerebral volume is age appropriate. No ventriculomegaly. Vascular: No acute abnormality. Skull: No evidence of calvarial fracture. Sinuses/Orbits: Complete opacification of the visualized right maxillary sinus, unchanged. Mucoperiosteal thickening and opacification of the anterior right ethmoidal air cells. Complete right frontal sinus opacification. No fluid levels. Other:  The mastoid air cells are unopacified. IMPRESSION: 1. No evidence of acute intracranial abnormality. No evidence of calvarial fracture. 2. Prominent chronic right paranasal sinusitis. Electronically Signed   By: Delbert Phenix M.D.   On: 11/18/2019 17:58     The results of significant diagnostics from this hospitalization (including imaging, microbiology, ancillary and laboratory) are  listed below for reference.     Microbiology: Recent Results (from the past 240 hour(s))  SARS CORONAVIRUS 2 (TAT 6-24 HRS) Nasopharyngeal Nasopharyngeal Swab     Status: Abnormal   Collection Time: 11/18/19  9:46 PM   Specimen: Nasopharyngeal Swab  Result Value Ref Range Status   SARS Coronavirus 2 POSITIVE (A) NEGATIVE Final    Comment: RESULT CALLED TO, READ BACK BY AND VERIFIED WITH: G. Zipf DC 10:45 11/19/19 (wilsonm) (NOTE) SARS-CoV-2 target nucleic acids are DETECTED. The SARS-CoV-2 RNA is generally detectable in upper and lower respiratory specimens during the acute phase of infection. Positive results are indicative of the presence of SARS-CoV-2 RNA. Clinical correlation with patient history and other diagnostic information is  necessary to determine patient  infection status. Positive results do not rule out bacterial infection or co-infection with other viruses.  The expected result is Negative. Fact Sheet for Patients: HairSlick.no Fact Sheet for Healthcare Providers: quierodirigir.com This test is not yet approved or cleared by the [place] FDA and  has been authorized for detection and/or diagnosis of SARS-CoV-2 by FDA under an Emergency Use Authorization (EUA). This EUA will remain  in effect (meaning this test can be used) for the  duration of the COVID-19 declaration under Section 564(b)(1) of the Act, 21 U.S.C. section 360bbb-3(b)(1), unless the authorization is terminated or revoked sooner. Performed at [facility], [address] [place], [place] [address]      Labs: BNP (last 3 results) Recent Labs    11/19/19 1134  BNP 16.0   Basic Metabolic Panel: Recent Labs  Lab 11/18/19 1141 11/18/19 1714 11/19/19 0127 11/19/19 0504  NA 137 137 136 141  K 3.8 5.4* 3.6 3.8  CL 106 106 104 106  CO2 14* 12* 22 24  GLUCOSE 132* 107* 101* 87  BUN CREATININE 1.08 1.21 0.91 0.99  CALCIUM 9.7 9.3 9.4 9.4  MG 1.7  --   --   --    Liver Function Tests: Recent Labs  Lab 11/18/19 1141  AST 29  ALT 23  ALKPHOS 41  BILITOT 0.9  PROT 7.6  ALBUMIN 4.2   No results for input(s): LIPASE, AMYLASE in the last 168 hours. No results for input(s): AMMONIA in the last 168 hours. CBC: Recent Labs  Lab 11/18/19 1141 11/18/19 1714 11/19/19 0504  WBC 5.2 12.0* 7.9  NEUTROABS 2.1 4.9  --   HGB 14.3 14.9 15.0  HCT 41.8 44.5 42.1  MCV 89.9 90.1 84.4  PLT 211 209 212   Cardiac Enzymes: No results for input(s): CKTOTAL, CKMB, CKMBINDEX, TROPONINI in the last 168 hours. BNP: Invalid input(s): POCBNP CBG: No results for input(s): GLUCAP in the last 168 hours. D-Dimer No results for input(s): DDIMER in the last 72 hours. Hgb A1c No results for input(s):  HGBA1C in the last 72 hours. Lipid Profile No results for input(s): CHOL, HDL, LDLCALC, TRIG, CHOLHDL, LDLDIRECT in the last 72 hours. Thyroid function studies No results for input(s): TSH, T4TOTAL, T3FREE, THYROIDAB in the last 72 hours.  Invalid input(s): FREET3 Anemia work up Recent Labs    11/19/19 1134  FERRITIN 66   Urinalysis    Component Value Date/Time   COLORURINE YELLOW (A) 08/06/2019 1927   APPEARANCEUR CLEAR (A) 08/06/2019 1927   LABSPEC 1.020 08/06/2019 1927   PHURINE 7.0 08/06/2019 1927   GLUCOSEU NEGATIVE 08/06/2019 1927   HGBUR NEGATIVE 08/06/2019 1927   BILIRUBINUR NEGATIVE 08/06/2019 1927   KETONESUR  80 (A) 08/06/2019 1927   PROTEINUR NEGATIVE 08/06/2019 1927   NITRITE NEGATIVE 08/06/2019 1927   LEUKOCYTESUR NEGATIVE 08/06/2019 1927   Sepsis Labs Invalid input(s): PROCALCITONIN,  WBC,  LACTICIDVEN Microbiology Recent Results (from the past 240 hour(s))  SARS CORONAVIRUS 2 (TAT 6-24 HRS) Nasopharyngeal Nasopharyngeal Swab     Status: Abnormal   Collection Time: 11/18/19  9:46 PM   Specimen: Nasopharyngeal Swab  Result Value Ref Range Status   SARS Coronavirus 2 POSITIVE (A) NEGATIVE Final    Comment: RESULT CALLED TO, READ BACK BY AND VERIFIED WITH: G. Zipf DC 10:45 11/19/19 (wilsonm) (NOTE) SARS-CoV-2 target nucleic acids are DETECTED. The SARS-CoV-2 RNA is generally detectable in upper and lower respiratory specimens during the acute phase of infection. Positive results are indicative of the presence of SARS-CoV-2 RNA. Clinical correlation with patient history and other diagnostic information is  necessary to determine patient infection status. Positive results do not rule out bacterial infection or co-infection with other viruses.  The expected result is Negative. Fact Sheet for Patients: HairSlick.nohttps://www.fda.gov/media/138098/download Fact Sheet for Healthcare Providers: quierodirigir.comhttps://www.fda.gov/media/138095/download This test is not yet approved or  cleared by the [place] FDA and  has been authorized for detection and/or diagnosis of SARS-CoV-2 by FDA under an Emergency Use Authorization (EUA). This EUA will remain  in effect (meaning this test can be used) for the  duration of the COVID-19 declaration under Section 564(b)(1) of the Act, 21 U.S.C. section 360bbb-3(b)(1), unless the authorization is terminated or revoked sooner. Performed at [facility], [address] [place], [place] [id number]      Time coordinating discharge:  I have spent 35 minutes face to face with the patient and on the ward discussing the patients care, assessment, plan and disposition with other care givers. >50% of the time was devoted counseling the patient about the risks and benefits of treatment/Discharge disposition and coordinating care.   SIGNED:   Dimple NanasAnkit Chirag Wynn Alldredge, MD  [facility] 11/19/2019, 1:49 PM   If 7PM-7AM, please contact night-coverage

## 2019-11-19 NOTE — Plan of Care (Signed)

## 2019-11-22 ENCOUNTER — Other Ambulatory Visit: Payer: Self-pay

## 2019-11-22 DIAGNOSIS — E039 Hypothyroidism, unspecified: Secondary | ICD-10-CM | POA: Diagnosis not present

## 2019-11-22 DIAGNOSIS — F17210 Nicotine dependence, cigarettes, uncomplicated: Secondary | ICD-10-CM | POA: Diagnosis not present

## 2019-11-22 DIAGNOSIS — Z79899 Other long term (current) drug therapy: Secondary | ICD-10-CM | POA: Diagnosis not present

## 2019-11-22 DIAGNOSIS — U071 COVID-19: Secondary | ICD-10-CM | POA: Insufficient documentation

## 2019-11-22 LAB — BASIC METABOLIC PANEL
Anion gap: 10 (ref 5–15)
BUN: 7 mg/dL (ref 6–20)
CO2: 23 mmol/L (ref 22–32)
Calcium: 9.1 mg/dL (ref 8.9–10.3)
Chloride: 104 mmol/L (ref 98–111)
Creatinine, Ser: 1.01 mg/dL (ref 0.61–1.24)
GFR calc Af Amer: >60 mL/min (ref >60)
GFR calc non Af Amer: >60 mL/min (ref >60)
Glucose, Bld: 89 mg/dL (ref 70–99)
Potassium: 3.5 mmol/L (ref 3.5–5.1)
Sodium: 137 mmol/L (ref 135–145)

## 2019-11-22 LAB — TROPONIN I (HIGH SENSITIVITY): Troponin I (High Sensitivity): 4 ng/L (ref <18)

## 2019-11-22 LAB — CBC
HCT: 36.9 % — ABNORMAL LOW (ref 39.0–52.0)
Hemoglobin: 13.1 g/dL (ref 13.0–17.0)
MCH: 30.6 pg (ref 26.0–34.0)
MCHC: 35.5 g/dL (ref 30.0–36.0)
MCV: 86.2 fL (ref 80.0–100.0)
Platelets: 183 10*3/uL (ref 150–400)
RBC: 4.28 MIL/uL (ref 4.22–5.81)
RDW: 12.8 % (ref 11.5–15.5)
WBC: 6.7 10*3/uL (ref 4.0–10.5)
nRBC: 0 % (ref 0.0–0.2)

## 2019-11-22 NOTE — ED Triage Notes (Signed)
Pt recently tested positive for covid, has diarrhea, left sided chest pain. Pt states pain began a day ago. nsr on ekg per ems. Pt appears in no acute distress.

## 2019-11-23 ENCOUNTER — Emergency Department: Payer: Medicaid Other

## 2019-11-23 ENCOUNTER — Emergency Department
Admission: EM | Admit: 2019-11-23 | Discharge: 2019-11-23 | Disposition: A | Discharge status: Home / Self Care | Payer: Medicaid Other | Attending: Emergency Medicine | Admitting: Emergency Medicine

## 2019-11-23 DIAGNOSIS — R079 Chest pain, unspecified: Secondary | ICD-10-CM

## 2019-11-23 DIAGNOSIS — U071 COVID-19: Secondary | ICD-10-CM

## 2019-11-23 LAB — FIBRIN DERIVATIVES D-DIMER (ARMC ONLY): Fibrin derivatives D-dimer (ARMC): 424.28 ng/mL (FEU) (ref 0.00–499.00)

## 2019-11-23 LAB — TROPONIN I (HIGH SENSITIVITY): Troponin I (High Sensitivity): 3 ng/L (ref <18)

## 2019-11-23 NOTE — ED Provider Notes (Signed)
[facility] Emergency Department Provider Note  ____________________________________________  Time seen: Approximately 4:00 AM  I have reviewed the triage vital signs and the nursing notes.   HISTORY  Chief Complaint Chest Pain and covid   HPI Daniel Craig is a 28 y.o. male the history of schizophrenia, seizures, hypothyroidism who tested positive for Covid 5 days ago presents for evaluation of chest pain.  Patient reports that he has been doing well  with no significant symptoms until today.  He tested positive for Covid after being exposed to family member.  He has had some mild diarrhea.  Today started having intermittent left-sided sharp/stabbing chest pain the last seconds and resolves without intervention.  He reports mild shortness of breath associated with it.  No cough, no vomiting or diarrhea, no fever.  No personal or family history of blood clots, no recent travel immobilization, no leg pain or swelling, no hemoptysis or exogenous hormones.  Past Medical History:  Diagnosis Date  . Schizophrenia (HCC)   . Seizures [facility])     Patient Active Problem List   Diagnosis Date Noted  . COVID-19 virus infection 11/19/2019  . Acquired hypothyroidism 11/18/2019  . Hyperkalemia 11/18/2019  . Recurrent seizures (HCC) 08/07/2019  . Extremely severe auditory hallucinations 06/02/2019  . Hypoglycemia 11/09/2018  . Schizophrenia, paranoid (HCC) 02/27/2018  . Cannabis use disorder, moderate, dependence (HCC) 02/27/2018  . Tobacco use disorder 02/27/2018  . 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 Altered mental status 12/28/2017  . Fever 08/11/2017  . Acute URI 08/11/2017  . Seizures (HCC) 06/25/2017    Past Surgical History:  Procedure Laterality Date  . MOUTH SURGERY      Prior to Admission medications   Medication Sig Start Date End Date Taking? Authorizing Provider  Ascorbic Acid (VITAMIN C) 1000 MG tablet Take 1,000 mg by mouth daily.     [provider]  divalproex (DEPAKOTE ER) 250 MG 24 hr tablet Take 3 tablets (750 mg total) by mouth 2 (two) times daily. 11/19/19 12/19/19  Dimple Nanas, MD  ethosuximide (ZARONTIN) 250 MG capsule Take 1 capsule (250 mg total) by mouth 2 (two) times daily. 07/04/19 10/02/19  Charm Rings, NP  lamoTRIgine (LAMICTAL) 100 MG tablet Take 1 tablet (100 mg total) by mouth every morning. 08/11/19   Enid Baas Jude, MD  lamoTRIgine (LAMICTAL) 200 MG tablet Take 1 tablet (200 mg total) by mouth at bedtime. 08/10/19   Enid Baas Jude, MD  levothyroxine (SYNTHROID) 50 MCG tablet Take 50 mcg by mouth daily before breakfast.    [provider]  nicotine (NICODERM CQ - DOSED IN MG/24 HOURS) 14 mg/24hr patch Place 1 patch (14 mg total) onto the skin daily. 08/11/19   Ojie, Jude, MD  OLANZapine (ZYPREXA) 10 MG tablet Take 10-20 mg by mouth daily. Take 10 mg by mouth in the morning and 20 mg by mouth at bedtime    [provider]  propranolol ER (INDERAL LA) 60 MG 24 hr capsule Take 60 mg by mouth daily.    [provider]  traZODone (DESYREL) 150 MG tablet Take 150 mg by mouth at bedtime.    [provider]  Vitamin D, Ergocalciferol, (DRISDOL) 50000 units CAPS capsule Take 50,000 Units by mouth every 7 (seven) days.    [provider]    Allergies Keppra [levetiracetam], Trileptal [oxcarbazepine], and Vimpat [lacosamide]  Family History  Family history unknown: Yes    Social History Social History   Tobacco Use  .  Smoking status: Current Every Day Smoker    Packs/day: 1.00    Types: Cigarettes  . Smokeless tobacco: Never Used  Substance Use Topics  . Alcohol use: Yes    Alcohol/week: 4.0 standard drinks    Types: 4 Shots of liquor per week    Comment: pt states 4 shots of liqour oer week  . Drug use: Yes    Types: Marijuana    Review of Systems  Constitutional: Negative for fever. Eyes: Negative for visual changes. ENT: Negative for sore  throat. Neck: No neck pain  Cardiovascular: + chest pain. Respiratory: + shortness of breath. Gastrointestinal: Negative for abdominal pain, vomiting. + diarrhea. Genitourinary: Negative for dysuria. Musculoskeletal: Negative for back pain. Skin: Negative for rash. Neurological: Negative for headaches, weakness or numbness. Psych: No SI or HI  ____________________________________________   PHYSICAL EXAM:  VITAL SIGNS: ED Triage Vitals  Enc Vitals Group     BP 11/22/19 2207 123/71     Pulse Rate 11/22/19 2206 72     Resp 11/22/19 2206 16     Temp 11/22/19 2206 97.8 F (36.6 C)     Temp Source 11/22/19 2206 Oral     SpO2 11/22/19 2206 100 %     Weight 11/22/19 2205 218 lb (98.9 kg)     Height 11/22/19 2205 6' (1.829 m)     Head Circumference --      Peak Flow --      Pain Score 11/22/19 2205 7     Pain Loc --      Pain Edu? --      Excl. in [place]? --     Constitutional: Alert and oriented. Well appearing and in no apparent distress. HEENT:      Head: Normocephalic and atraumatic.         Eyes: Conjunctivae are normal. Sclera is non-icteric.       Mouth/Throat: Mucous membranes are moist.       Neck: Supple with no signs of meningismus. Cardiovascular: Regular rate and rhythm. No murmurs, gallops, or rubs. 2+ symmetrical distal pulses are present in all extremities. No JVD. Respiratory: Normal respiratory effort. Lungs are clear to auscultation bilaterally. No wheezes, crackles, or rhonchi.  Gastrointestinal: Soft, non tender, and non distended with positive bowel sounds. No rebound or guarding. Musculoskeletal: Nontender with normal range of motion in all extremities. No edema, cyanosis, or erythema of extremities. Neurologic: Normal speech and language. Face is symmetric. Moving all extremities. No gross focal neurologic deficits are appreciated. Skin: Skin is warm, dry and intact. No rash noted. Psychiatric: Mood and affect are normal. Speech and behavior are  normal.  ____________________________________________   LABS (all labs ordered are listed, but only abnormal results are displayed)  Labs Reviewed  CBC - Abnormal; Notable for the following components:      Result Value   HCT 36.9 (*)    All other components within normal limits  BASIC METABOLIC PANEL  FIBRIN DERIVATIVES D-DIMER ([facility] ONLY)  TROPONIN I (HIGH SENSITIVITY)  TROPONIN I (HIGH SENSITIVITY)   ____________________________________________  EKG  ED ECG REPORT I, Rudene Re, the attending physician, personally viewed and interpreted this ECG.  Normal sinus rhythm, rate of 70, normal intervals, normal axis, no ST elevations or depressions, T wave flattening in inferior leads, new when compared to prior. ____________________________________________  RADIOLOGY  I have personally reviewed the images performed during this visit and I agree with the Radiologist's read.   Interpretation by Radiologist:  DG Chest Port 1  View  Result Date: 11/23/2019 CLINICAL DATA:  Chest pain and diarrhea.  COVID-19 positive. EXAM: PORTABLE CHEST 1 VIEW COMPARISON:  08/06/2019 FINDINGS: The heart size and mediastinal contours are within normal limits. Both lungs are clear. The visualized skeletal structures are unremarkable. IMPRESSION: No active disease. Electronically Signed   By: Deatra RobinsonKevin  Herman M.D.   On: 11/23/2019 03:02     ____________________________________________   PROCEDURES  Procedure(s) performed: None Procedures Critical Care performed:  None ____________________________________________   INITIAL IMPRESSION / ASSESSMENT AND PLAN / ED COURSE  28 y.o. male the history of schizophrenia, seizures, hypothyroidism who tested positive for Covid 5 days ago who presents for evaluation of chest pain.  Patient is extremely well-appearing in no distress with normal vital signs, normal work of breathing, normal sats at rest and with ambulation.  Chest x-ray negative for  pneumonia.  EKG and troponin with no evidence of ischemia, dysrhythmia, or myocarditis.  Patient with no tachypnea, no tachycardia, no hypoxia, no fever.  D-dimer negative.  Most likely chest pain is due to Covid.  No electrolyte derangements, no significant dehydration or AKI, no leukocytosis, no signs of sepsis.  Discussed quarantine and pulse oximeter checks at home.  Discussed standard return precautions and follow-up with PCP.       As part of my medical decision making, I reviewed the following data within the electronic MEDICAL RECORD NUMBER Nursing notes reviewed and incorporated, Labs reviewed , EKG interpreted , Old EKG reviewed, Old chart reviewed, Radiograph reviewed , Notes from prior ED visits and [place] Controlled Substance Database   Please note:  Patient was evaluated in Emergency Department today for the symptoms described in the history of present illness. Patient was evaluated in the context of the global COVID-19 pandemic, which necessitated consideration that the patient might be at risk for infection with the SARS-CoV-2 virus that causes COVID-19. Institutional protocols and algorithms that pertain to the evaluation of patients at risk for COVID-19 are in a state of rapid change based on information released by regulatory bodies including the CDC and federal and state organizations. These policies and algorithms were followed during the patient's care in the ED.  Some ED evaluations and interventions may be delayed as a result of limited staffing during the pandemic.   ____________________________________________   FINAL CLINICAL IMPRESSION(S) / ED DIAGNOSES   Final diagnoses:  COVID-19      NEW MEDICATIONS STARTED DURING THIS VISIT:  ED Discharge Orders    None       Note:  This document was prepared using Dragon voice recognition software and may include unintentional dictation errors.    Don PerkingVeronese, [place], MD 11/23/19 0730

## 2019-11-23 NOTE — Discharge Instructions (Signed)

## 2019-11-23 NOTE — ED Notes (Signed)
Pt ambulatory around lobby and to outside in no acute distress. Pt has to be reminded multiple times to leave mask on face.

## 2019-11-23 NOTE — ED Notes (Signed)
Legal guardian updated on impending d/c. D/c instructions discussed with legal guardian who voices no further questions at this time. Legal guardian informed this RN that cab voucher would be needed for pt. Cab voucher given to pt per charge rn greg. Pt ambulated to lobby in NAD. Pt in covid waiting area for [employer] to arrive. [employer] informed this RN it would be after shift change before pt would get ride. First nurse April, RN aware.

## 2019-12-09 ENCOUNTER — Ambulatory Visit: Payer: Medicaid Other | Attending: Internal Medicine

## 2019-12-09 DIAGNOSIS — Z20822 Contact with and (suspected) exposure to covid-19: Secondary | ICD-10-CM

## 2019-12-10 ENCOUNTER — Telehealth: Payer: Self-pay | Admitting: *Deleted

## 2019-12-10 LAB — NOVEL CORONAVIRUS, NAA: SARS-CoV-2, NAA: NOT DETECTED

## 2019-12-10 NOTE — Telephone Encounter (Signed)
Patient's dad called for results ,still pending. 

## 2019-12-11 ENCOUNTER — Telehealth: Payer: Self-pay | Admitting: *Deleted

## 2019-12-11 DIAGNOSIS — G40109 Localization-related (focal) (partial) symptomatic epilepsy and epileptic syndromes with simple partial seizures, not intractable, without status epilepticus: Principal | ICD-10-CM

## 2019-12-11 MED ORDER — DIVALPROEX 500 MG TABLET,DELAYED RELEASE
ORAL_TABLET | Freq: Two times a day (BID) | ORAL | 3 refills | 90.00000 days | Status: CP
Start: 2019-12-11 — End: ?

## 2019-12-11 NOTE — Telephone Encounter (Signed)
Pt notified that his test result for covid was negative. He voiced understanding.

## 2019-12-15 DIAGNOSIS — G40109 Localization-related (focal) (partial) symptomatic epilepsy and epileptic syndromes with simple partial seizures, not intractable, without status epilepticus: Principal | ICD-10-CM

## 2019-12-15 MED ORDER — DIVALPROEX 500 MG TABLET,DELAYED RELEASE
ORAL_TABLET | Freq: Two times a day (BID) | ORAL | 3 refills | 90 days | Status: CP
Start: 2019-12-15 — End: ?

## 2019-12-16 ENCOUNTER — Telehealth: Payer: Self-pay

## 2019-12-16 ENCOUNTER — Ambulatory Visit: Admit: 2019-12-16 | Discharge: 2019-12-17 | Payer: MEDICAID

## 2019-12-16 DIAGNOSIS — G40909 Epilepsy, unspecified, not intractable, without status epilepticus: Principal | ICD-10-CM

## 2019-12-16 DIAGNOSIS — G40109 Localization-related (focal) (partial) symptomatic epilepsy and epileptic syndromes with simple partial seizures, not intractable, without status epilepticus: Principal | ICD-10-CM

## 2019-12-16 MED ORDER — DIVALPROEX 500 MG TABLET,DELAYED RELEASE
ORAL_TABLET | Freq: Two times a day (BID) | ORAL | 3 refills | 90.00000 days | Status: CP
Start: 2019-12-16 — End: 2019-12-16

## 2019-12-16 MED ORDER — DIVALPROEX 500 MG TABLET,DELAYED RELEASE: 1000 mg | tablet | Freq: Two times a day (BID) | 3 refills | 90 days | Status: AC

## 2019-12-16 MED ORDER — LAMOTRIGINE 100 MG TABLET: tablet | 11 refills | 0 days | Status: AC

## 2019-12-16 MED ORDER — LAMOTRIGINE 100 MG TABLET
ORAL_TABLET | ORAL | 11 refills | 0.00000 days | Status: CP
Start: 2019-12-16 — End: 2019-12-16

## 2019-12-16 NOTE — Telephone Encounter (Signed)
Per pt. request, faxed COVID result to [facility] @ [phone].

## 2019-12-16 NOTE — Telephone Encounter (Signed)
Pt notified of negative COVID-19 results. Understanding verbalized.  Daniel Craig   

## 2020-03-22 ENCOUNTER — Encounter: Payer: Self-pay | Admitting: Emergency Medicine

## 2020-03-22 ENCOUNTER — Other Ambulatory Visit: Payer: Self-pay

## 2020-03-22 ENCOUNTER — Emergency Department: Payer: Medicaid Other

## 2020-03-22 ENCOUNTER — Emergency Department
Admission: EM | Admit: 2020-03-22 | Discharge: 2020-03-22 | Disposition: A | Discharge status: Home / Self Care | Payer: Medicaid Other | Attending: Student | Admitting: Student

## 2020-03-22 DIAGNOSIS — Z79899 Other long term (current) drug therapy: Secondary | ICD-10-CM | POA: Insufficient documentation

## 2020-03-22 DIAGNOSIS — F17210 Nicotine dependence, cigarettes, uncomplicated: Secondary | ICD-10-CM | POA: Diagnosis not present

## 2020-03-22 DIAGNOSIS — R569 Unspecified convulsions: Secondary | ICD-10-CM

## 2020-03-22 DIAGNOSIS — E039 Hypothyroidism, unspecified: Secondary | ICD-10-CM | POA: Diagnosis not present

## 2020-03-22 DIAGNOSIS — Z8616 Personal history of COVID-19: Secondary | ICD-10-CM | POA: Diagnosis not present

## 2020-03-22 HISTORY — DX: COVID-19: U07.1

## 2020-03-22 LAB — URINALYSIS, COMPLETE (UACMP) WITH MICROSCOPIC
Bacteria, UA: NONE SEEN
Bilirubin Urine: NEGATIVE
Glucose, UA: NEGATIVE mg/dL
Hgb urine dipstick: NEGATIVE
Ketones, ur: 5 mg/dL — AB
Leukocytes,Ua: NEGATIVE
Nitrite: NEGATIVE
Protein, ur: NEGATIVE mg/dL
Specific Gravity, Urine: 1.014 (ref 1.005–1.030)
Squamous Epithelial / HPF: NONE SEEN (ref 0–5)
pH: 7 (ref 5.0–8.0)

## 2020-03-22 LAB — BASIC METABOLIC PANEL
Anion gap: 5 (ref 5–15)
BUN: 9 mg/dL (ref 6–20)
CO2: 26 mmol/L (ref 22–32)
Calcium: 8.9 mg/dL (ref 8.9–10.3)
Chloride: 102 mmol/L (ref 98–111)
Creatinine, Ser: 1.09 mg/dL (ref 0.61–1.24)
GFR calc Af Amer: >60 mL/min (ref >60)
GFR calc non Af Amer: >60 mL/min (ref >60)
Glucose, Bld: 86 mg/dL (ref 70–99)
Potassium: 4.5 mmol/L (ref 3.5–5.1)
Sodium: 133 mmol/L — ABNORMAL LOW (ref 135–145)

## 2020-03-22 LAB — URINE DRUG SCREEN, QUALITATIVE (ARMC ONLY)
Amphetamines, Ur Screen: NOT DETECTED
Barbiturates, Ur Screen: NOT DETECTED
Benzodiazepine, Ur Scrn: NOT DETECTED
Cannabinoid 50 Ng, Ur ~~LOC~~: POSITIVE — AB
Cocaine Metabolite,Ur ~~LOC~~: NOT DETECTED
MDMA (Ecstasy)Ur Screen: NOT DETECTED
Methadone Scn, Ur: NOT DETECTED
Opiate, Ur Screen: NOT DETECTED
Phencyclidine (PCP) Ur S: NOT DETECTED
Tricyclic, Ur Screen: NOT DETECTED

## 2020-03-22 LAB — VALPROIC ACID LEVEL: Valproic Acid Lvl: 145 ug/mL — ABNORMAL HIGH (ref 50.0–100.0)

## 2020-03-22 LAB — CBC
HCT: 38.8 % — ABNORMAL LOW (ref 39.0–52.0)
Hemoglobin: 13.5 g/dL (ref 13.0–17.0)
MCH: 31.8 pg (ref 26.0–34.0)
MCHC: 34.8 g/dL (ref 30.0–36.0)
MCV: 91.5 fL (ref 80.0–100.0)
Platelets: UNDETERMINED 10*3/uL (ref 150–400)
RBC: 4.24 MIL/uL (ref 4.22–5.81)
RDW: 13.2 % (ref 11.5–15.5)
WBC: 5.5 10*3/uL (ref 4.0–10.5)
nRBC: 0 % (ref 0.0–0.2)

## 2020-03-22 MED ORDER — SODIUM CHLORIDE 0.9 % IV BOLUS
1000.0000 mL | Freq: Once | INTRAVENOUS | Status: AC
  Administered 2020-03-22: 1000 mL via INTRAVENOUS

## 2020-03-22 NOTE — ED Notes (Addendum)
Informed father and mother patient being discharged and will need transportation home, family is requesting patient be sent home with a cab voucher. Advised that we do not give cab vouchers and that patient will need to pay for cab if he is going to use a cab service. Dad said pt can call friend to come get him if he has a friend to pick him up.  Father and mother said to get a hold of Simonne Maffucci the patient's case worker, we called her from Girard's cell phone and informed her that we do not give cab vouchers.  Was told pt previously had cab vouchers in the past, told her we don't do them now. She will call back.

## 2020-03-22 NOTE — ED Notes (Signed)
Pt in CT.

## 2020-03-22 NOTE — ED Notes (Signed)
Lab called, adding on Lamotrigine level-spoke with Jaynie Collins

## 2020-03-22 NOTE — ED Notes (Signed)
Pt unable to void at this time, urinal at bedside 

## 2020-03-22 NOTE — ED Notes (Signed)
Pt able to arrange ride from his friend Rhonda's mother. Pt escorted to car at discharge.

## 2020-03-22 NOTE — ED Provider Notes (Signed)
[facility] Emergency Department Provider Note  ____________________________________________   First MD Initiated Contact with Patient 03/22/20 1325     (approximate)  I have reviewed the triage vital signs and the nursing notes.  History  Chief Complaint Seizures    HPI Daniel Craig is a 29 y.o. male with a history of seizures, schizophrenia who presents to the emergency department for seizure.  Patient had a witnessed seizure today while talking to somebody in their yard.  Patient states he has been compliant with all of his antiepileptic medications, he denies any missed doses. States he typically has a seizure every few months. Did have a seizure earlier in April and has an abrasion to the face and some ecchymosis to the R eyelid from prior seizure, denies any new injuries from today. Denies any recent illnesses or infection. On arrival he is AO x 3 and has no acute complaints.    Past Medical Hx Past Medical History:  Diagnosis Date  . Schizophrenia (HCC)   . Seizures [facility])     Problem List Patient Active Problem List   Diagnosis Date Noted  . COVID-19 virus infection 11/19/2019  . Acquired hypothyroidism 11/18/2019  . Hyperkalemia 11/18/2019  . Recurrent seizures (HCC) 08/07/2019  . Extremely severe auditory hallucinations 06/02/2019  . Hypoglycemia 11/09/2018  . Schizophrenia, paranoid (HCC) 02/27/2018  . Cannabis use disorder, moderate, dependence (HCC) 02/27/2018  . Tobacco use disorder 02/27/2018  . 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 Altered mental status 12/28/2017  . Fever 08/11/2017  . Acute URI 08/11/2017  . Seizures (HCC) 06/25/2017    Past Surgical Hx Past Surgical History:  Procedure Laterality Date  . MOUTH SURGERY      Medications Prior to Admission medications   Medication Sig Start Date End Date Taking? Authorizing Provider  Ascorbic Acid (VITAMIN C) 1000 MG tablet Take 1,000 mg by mouth daily.    [provider]  divalproex (DEPAKOTE ER) 250 MG 24 hr tablet Take 3 tablets (750 mg total) by mouth 2 (two) times daily. 11/19/19 12/19/19  Dimple Nanas, MD  ethosuximide (ZARONTIN) 250 MG capsule Take 1 capsule (250 mg total) by mouth 2 (two) times daily. 07/04/19 10/02/19  Charm Rings, NP  lamoTRIgine (LAMICTAL) 100 MG tablet Take 1 tablet (100 mg total) by mouth every morning. 08/11/19   Enid Baas Jude, MD  lamoTRIgine (LAMICTAL) 200 MG tablet Take 1 tablet (200 mg total) by mouth at bedtime. 08/10/19   Enid Baas Jude, MD  levothyroxine (SYNTHROID) 50 MCG tablet Take 50 mcg by mouth daily before breakfast.    [provider]  nicotine (NICODERM CQ - DOSED IN MG/24 HOURS) 14 mg/24hr patch Place 1 patch (14 mg total) onto the skin daily. 08/11/19   Ojie, Jude, MD  OLANZapine (ZYPREXA) 10 MG tablet Take 10-20 mg by mouth daily. Take 10 mg by mouth in the morning and 20 mg by mouth at bedtime    [provider]  propranolol ER (INDERAL LA) 60 MG 24 hr capsule Take 60 mg by mouth daily.    [provider]  traZODone (DESYREL) 150 MG tablet Take 150 mg by mouth at bedtime.    [provider]  Vitamin D, Ergocalciferol, (DRISDOL) 50000 units CAPS capsule Take 50,000 Units by mouth every 7 (seven) days.    [provider]    Allergies Keppra [levetiracetam], Trileptal [oxcarbazepine], and Vimpat [lacosamide]  Family Hx Family History  Family history unknown: Yes    Social  Hx Social History   Tobacco Use  . Smoking status: Current Every Day Smoker    Packs/day: 1.00    Types: Cigarettes  . Smokeless tobacco: Never Used  Substance Use Topics  . Alcohol use: Yes    Alcohol/week: 4.0 standard drinks    Types: 4 Shots of liquor per week    Comment: pt states 4 shots of liqour oer week  . Drug use: Yes    Types: Marijuana     Review of Systems  Constitutional: Negative for fever. Negative for chills. Eyes: Negative for visual changes. ENT:  Negative for sore throat. Cardiovascular: Negative for chest pain. Respiratory: Negative for shortness of breath. Gastrointestinal: Negative for nausea. Negative for vomiting.  Genitourinary: Negative for dysuria. Musculoskeletal: Negative for leg swelling. Skin: Negative for rash. Neurological: Negative for headaches. + seizure   Physical Exam  Vital Signs: ED Triage Vitals [03/22/20 1324]  Enc Vitals Group     BP 112/72     Pulse Rate 69     Resp 20     Temp 98.4 F (36.9 C)     Temp Source Oral     SpO2 99 %     Weight      Height      Head Circumference      Peak Flow      Pain Score 0     Pain Loc      Pain Edu?      Excl. in [place]?     Constitutional: Alert and oriented. Well appearing. NAD.  Head: Normocephalic. Scattered abrasion to face - states these are old from seizure several weeks ago.  Eyes: Conjunctivae clear. Sclera anicteric. Pupils equal and symmetric. Ecchymosis but not swelling to the R upper eyelid. Nose: No masses or lesions. No congestion or rhinorrhea. Mouth/Throat: Wearing mask.  No intraoral or dental trauma.  Neck: No stridor. Trachea midline.  Cardiovascular: Normal rate, regular rhythm. Extremities well perfused. Respiratory: Normal respiratory effort.  Lungs CTAB. Gastrointestinal: Soft. Non-distended. Non-tender.  Genitourinary: Deferred. Musculoskeletal: No lower extremity edema. No deformities. Neurologic:  Normal speech and language. No gross focal or lateralizing neurologic deficits are appreciated.  Skin: Skin as above. No rashes or lesions.  Psychiatric: Mood and affect are appropriate for situation.  EKG  Personally reviewed and interpreted by myself.   Date: 03/22/20 Time: 1332 Rate: 69 Rhythm: sinus Axis: normal Intervals: WNL No STEMI    Radiology  Personally reviewed available imaging myself.   CT  - IMPRESSION:  No evidence of acute intracranial abnormality.   Redemonstrated right ostiomeatal complex  obstructive pattern  paranasal sinus disease.    Procedures  Procedure(s) performed (including critical care):  Procedures   Initial Impression / Assessment and Plan / MDM / ED Course  29 y.o. male who presents to the ED for seizure. Hx of same.  Ddx: breakthrough seizure, subtherapeutic AEDs, underlying electrolyte abnormality or infection, toxicologic  Will plan for basic labs, monitoring, CT head imaging. Patient has an appointment with his neurologist already scheduled for Thursday. If work up unremarkable, anticipate discharge with outpatient follow up.   _______________________________   As part of my medical decision making I have reviewed available labs, radiology tests, reviewed old records/performed chart review.   Final Clinical Impression(s) / ED Diagnosis  Final diagnoses:  Seizure [facility])       Note:  This document was prepared using Dragon voice recognition software and may include unintentional dictation errors.   Lilia Pro., MD 03/22/20  1630  

## 2020-03-22 NOTE — ED Notes (Signed)
Father Daniel Craig called, left voicemail to call back, [phone]. It is noted in the patient's chart that he is the patient's legal guardian as well.

## 2020-03-22 NOTE — ED Triage Notes (Addendum)
Pt arrived via [facility] from home with reports of seizure activity today. Pt was outside and was witnessed that he fell down and started having seizure like activity throughout entire body. Pt states he had seizure in the past week.  Pt reported to EMS that he has taken his meds without any missed doses.  Per EMS pt was post-ictal on arrival, pt more alert on arrival to ED.  Pt has abrasion to the right side of the face from previous seizure.  Pt has hx of epilepsy

## 2020-03-22 NOTE — ED Notes (Signed)
Attempted to call patient's mother as well, she answered but phone call was cut out multiple times.

## 2020-03-22 NOTE — Discharge Instructions (Addendum)
Your lab tests today were reassuring. Keep taking all of your medications, and be sure to stay hydrated and get plenty of sleep.  Keep your appointment with your neurologist in 2 days.

## 2020-03-23 ENCOUNTER — Ambulatory Visit: Admit: 2020-03-23 | Discharge: 2020-03-24 | Payer: MEDICAID

## 2020-03-23 DIAGNOSIS — G40209 Localization-related (focal) (partial) symptomatic epilepsy and epileptic syndromes with complex partial seizures, not intractable, without status epilepticus: Principal | ICD-10-CM

## 2020-03-23 LAB — LAMOTRIGINE LEVEL: Lamotrigine Lvl: 14.9 ug/mL (ref 2.0–20.0)

## 2020-04-26 ENCOUNTER — Ambulatory Visit: Admit: 2020-04-26 | Discharge: 2020-05-03 | Disposition: A | Discharge status: Home / Self Care | Payer: MEDICAID

## 2020-04-26 DIAGNOSIS — G40209 Localization-related (focal) (partial) symptomatic epilepsy and epileptic syndromes with complex partial seizures, not intractable, without status epilepticus: Principal | ICD-10-CM

## 2020-04-28 MED ORDER — VIMPAT 100 MG TABLET
ORAL_TABLET | Freq: Two times a day (BID) | ORAL | 0 refills | 30.00000 days
Start: 2020-04-28 — End: 2020-04-28

## 2020-05-02 MED ORDER — CENOBAMATE 12.5 MG (14)-25 MG (14) TABLETS IN A DOSE PACK
ORAL_TABLET | 0 refills | 0 days | Status: CP
Start: 2020-05-02 — End: ?

## 2020-05-03 DIAGNOSIS — G40909 Epilepsy, unspecified, not intractable, without status epilepticus: Principal | ICD-10-CM

## 2020-05-03 MED ORDER — PROPRANOLOL ER 60 MG CAPSULE,24 HR,EXTENDED RELEASE
ORAL_CAPSULE | Freq: Every day | ORAL | 0 refills | 30.00000 days | Status: CP
Start: 2020-05-03 — End: ?
  Filled 2020-05-03: qty 30, 30d supply, fill #0

## 2020-05-03 MED ORDER — ASCORBIC ACID (VITAMIN C) 500 MG TABLET
ORAL_TABLET | Freq: Every day | ORAL | 0 refills | 50.00000 days | Status: CP
Start: 2020-05-03 — End: ?
  Filled 2020-05-03: qty 100, 50d supply, fill #0

## 2020-05-03 MED ORDER — TRAZODONE 100 MG TABLET
ORAL_TABLET | Freq: Every evening | ORAL | 0 refills | 30.00000 days | Status: CP
Start: 2020-05-03 — End: ?
  Filled 2020-05-03: qty 60, 30d supply, fill #0

## 2020-05-03 MED ORDER — DIVALPROEX ER 250 MG TABLET,EXTENDED RELEASE 24 HR
ORAL_TABLET | Freq: Two times a day (BID) | ORAL | 11 refills | 30.00000 days | Status: CP
Start: 2020-05-03 — End: 2020-06-02
  Filled 2020-05-03: qty 60, 30d supply, fill #0

## 2020-05-03 MED ORDER — CETIRIZINE 10 MG TABLET
ORAL_TABLET | Freq: Every day | ORAL | 0 refills | 30.00000 days | Status: CP
Start: 2020-05-03 — End: ?
  Filled 2020-05-03: qty 30, 30d supply, fill #0

## 2020-05-03 MED ORDER — CENOBAMATE 12.5 MG (14)-25 MG (14) TABLETS IN A DOSE PACK
ORAL_TABLET | ORAL | 0 refills | 0.00000 days | Status: CP
Start: 2020-05-03 — End: ?

## 2020-05-03 MED ORDER — LAMOTRIGINE 100 MG TABLET
ORAL_TABLET | ORAL | 11 refills | 0.00000 days | Status: CP
Start: 2020-05-03 — End: ?
  Filled 2020-05-03: qty 90, 30d supply, fill #0

## 2020-05-03 MED ORDER — RISPERIDONE 3 MG TABLET
ORAL_TABLET | Freq: Two times a day (BID) | ORAL | 0 refills | 90.00000 days | Status: CP
Start: 2020-05-03 — End: ?
  Filled 2020-05-03: qty 30, 15d supply, fill #0

## 2020-05-03 MED ORDER — LEVOTHYROXINE 50 MCG TABLET
ORAL_TABLET | Freq: Every day | ORAL | 0 refills | 30.00000 days | Status: CP
Start: 2020-05-03 — End: ?
  Filled 2020-05-03: qty 30, 30d supply, fill #0

## 2020-05-03 MED ORDER — LEVOCARNITINE 500 MG TABLET
ORAL_TABLET | Freq: Two times a day (BID) | ORAL | 11 refills | 0.00000 days | Status: CP
Start: 2020-05-03 — End: ?

## 2020-05-03 MED ORDER — DIVALPROEX ER 250 MG TABLET,EXTENDED RELEASE 24 HR: 250 mg | tablet | Freq: Two times a day (BID) | 0 refills | 30 days | Status: AC

## 2020-05-03 MED FILL — LAMOTRIGINE 100 MG TABLET: 30 days supply | Qty: 90 | Fill #0 | Status: AC

## 2020-05-03 MED FILL — LEVOTHYROXINE 50 MCG TABLET: 30 days supply | Qty: 30 | Fill #0 | Status: AC

## 2020-05-03 MED FILL — DIVALPROEX ER 250 MG TABLET,EXTENDED RELEASE 24 HR: 30 days supply | Qty: 60 | Fill #0 | Status: AC

## 2020-05-03 MED FILL — VITAMIN C 500 MG TABLET: 50 days supply | Qty: 100 | Fill #0 | Status: AC

## 2020-05-03 MED FILL — RISPERIDONE 3 MG TABLET: 15 days supply | Qty: 30 | Fill #0 | Status: AC

## 2020-05-03 MED FILL — CETIRIZINE 10 MG TABLET: 30 days supply | Qty: 30 | Fill #0 | Status: AC

## 2020-05-03 MED FILL — TRAZODONE 100 MG TABLET: 30 days supply | Qty: 60 | Fill #0 | Status: AC

## 2020-05-03 MED FILL — PROPRANOLOL ER 60 MG CAPSULE,24 HR,EXTENDED RELEASE: 30 days supply | Qty: 30 | Fill #0 | Status: AC

## 2020-05-04 MED ORDER — LEVOTHYROXINE 50 MCG TABLET
ORAL_TABLET | Freq: Every day | ORAL | 0 refills | 30.00000 days | Status: CP
Start: 2020-05-04 — End: 2020-06-03

## 2020-05-05 ENCOUNTER — Emergency Department
Admission: EM | Admit: 2020-05-05 | Discharge: 2020-05-05 | Disposition: A | Discharge status: Home / Self Care | Payer: Medicaid Other | Attending: Emergency Medicine | Admitting: Emergency Medicine

## 2020-05-05 ENCOUNTER — Other Ambulatory Visit: Payer: Self-pay

## 2020-05-05 DIAGNOSIS — R569 Unspecified convulsions: Secondary | ICD-10-CM | POA: Diagnosis present

## 2020-05-05 DIAGNOSIS — F1220 Cannabis dependence, uncomplicated: Secondary | ICD-10-CM | POA: Insufficient documentation

## 2020-05-05 DIAGNOSIS — G40909 Epilepsy, unspecified, not intractable, without status epilepticus: Secondary | ICD-10-CM | POA: Insufficient documentation

## 2020-05-05 DIAGNOSIS — F17210 Nicotine dependence, cigarettes, uncomplicated: Secondary | ICD-10-CM | POA: Insufficient documentation

## 2020-05-05 DIAGNOSIS — Z79899 Other long term (current) drug therapy: Secondary | ICD-10-CM | POA: Diagnosis not present

## 2020-05-05 DIAGNOSIS — E039 Hypothyroidism, unspecified: Secondary | ICD-10-CM | POA: Diagnosis not present

## 2020-05-05 DIAGNOSIS — Z888 Allergy status to other drugs, medicaments and biological substances status: Secondary | ICD-10-CM | POA: Insufficient documentation

## 2020-05-05 NOTE — ED Notes (Signed)
Father (lega guardian) Tarrin Lebow will be here in 1 hr.

## 2020-05-05 NOTE — ED Triage Notes (Signed)
Pt in by ems with co seizure, neighbor states grand mal lasted a few minutes. Pt is on depakote did take a dose this am but was on his way to take evening dose. Pt states last seizure was 2 weeks ago, was admitted at [facility] for the same.

## 2020-05-05 NOTE — ED Notes (Signed)
Pt eating meal tray 

## 2020-05-05 NOTE — Discharge Instructions (Addendum)
Please seek medical attention for any high fevers, chest pain, shortness of breath, change in behavior, persistent vomiting, bloody stool or any other new or concerning symptoms.  

## 2020-05-05 NOTE — ED Notes (Addendum)
Patient discharged to home per MD order. Patient in stable condition, and deemed medically cleared by ED provider for discharge. Discharge instructions reviewed with patient/family using "Teach Back"; verbalized understanding of medication education and administration, and information about follow-up care. Denies further concerns. Pt discharged with father who is legal guardian.

## 2020-05-05 NOTE — ED Provider Notes (Signed)
[facility] Emergency Department Provider Note    ____________________________________________   I have reviewed the triage vital signs and the nursing notes.   HISTORY  Chief Complaint Seizures   History limited by: Not Limited   HPI Raad Xzavien Harada is a 29 y.o. male who presents to the emergency department today because of concerns for a seizure.  Patient states he has a history of epilepsy.  States he has been taking this medication but he still gets seizures while he is on his medication.  States he was just released from the hospital yesterday.  The patient denies any injuries from the seizure.  He says that he has not been sick since leaving the hospital.   Records reviewed. Per medical record review patient has a history of seizures.   Past Medical History:  Diagnosis Date  . COVID-14 Nov 2019  . Schizophrenia (HCC)   . Seizures [facility])     Patient Active Problem List   Diagnosis Date Noted  . COVID-19 virus infection 11/19/2019  . Acquired hypothyroidism 11/18/2019  . Hyperkalemia 11/18/2019  . Recurrent seizures (HCC) 08/07/2019  . Extremely severe auditory hallucinations 06/02/2019  . Hypoglycemia 11/09/2018  . Schizophrenia, paranoid (HCC) 02/27/2018  . Cannabis use disorder, moderate, dependence (HCC) 02/27/2018  . Tobacco use disorder 02/27/2018  . 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 Altered mental status 12/28/2017  . Fever 08/11/2017  . Acute URI 08/11/2017  . Seizures (HCC) 06/25/2017    Past Surgical History:  Procedure Laterality Date  . MOUTH SURGERY      Prior to Admission medications   Medication Sig Start Date End Date Taking? Authorizing Provider  Ascorbic Acid (VITAMIN C) 1000 MG tablet Take 1,000 mg by mouth daily.    [provider]  divalproex (DEPAKOTE) 500 MG DR tablet Take 1,000 mg by mouth 2 (two) times daily.    [provider]  lamoTRIgine (LAMICTAL) 100 MG tablet Take 1 tablet (100  mg total) by mouth every morning. 08/11/19   Enid Baas Jude, MD  lamoTRIgine (LAMICTAL) 200 MG tablet Take 1 tablet (200 mg total) by mouth at bedtime. 08/10/19   Enid Baas Jude, MD  levothyroxine (SYNTHROID) 50 MCG tablet Take 50 mcg by mouth daily before breakfast.    [provider]  propranolol ER (INDERAL LA) 60 MG 24 hr capsule Take 60 mg by mouth daily.    [provider]  risperiDONE (RISPERDAL) 2 MG tablet Take 2 mg by mouth 2 (two) times daily.    [provider]  traZODone (DESYREL) 100 MG tablet Take 200 mg by mouth at bedtime.     [provider]    Allergies Keppra [levetiracetam], Trileptal [oxcarbazepine], and Vimpat [lacosamide]  Family History  Family history unknown: Yes    Social History Social History   Tobacco Use  . Smoking status: Current Every Day Smoker    Packs/day: 1.00    Types: Cigarettes  . Smokeless tobacco: Never Used  Vaping Use  . Vaping Use: Never used  Substance Use Topics  . Alcohol use: Yes    Alcohol/week: 4.0 standard drinks    Types: 4 Shots of liquor per week    Comment: pt states 4 shots of liqour oer week  . Drug use: Yes    Types: Marijuana    Review of Systems Constitutional: No fever/chills Eyes: No visual changes. ENT: No sore throat. Cardiovascular: Denies chest pain. Respiratory: Denies shortness of breath. Gastrointestinal: No abdominal pain.  No  nausea, no vomiting.  No diarrhea.   Genitourinary: Negative for dysuria. Musculoskeletal: Negative for back pain. Skin: Negative for rash. Neurological: Positive for seizure. ____________________________________________   PHYSICAL EXAM:  VITAL SIGNS: ED Triage Vitals [05/05/20 2039]  Enc Vitals Group     BP 112/68     Pulse Rate 95     Resp 20     Temp 98.3 F (36.8 C)     Temp Source Oral     SpO2 100 %     Weight 190 lb (86.2 kg)     Height 6\' 1"  (1.854 m)     Head Circumference      Peak Flow      Pain Score 0   Constitutional:  Alert and oriented.  Eyes: Conjunctivae are normal.  ENT      Head: Normocephalic and atraumatic.      Nose: No congestion/rhinnorhea.      Mouth/Throat: Mucous membranes are moist.      Neck: No stridor. Hematological/Lymphatic/Immunilogical: No cervical lymphadenopathy. Cardiovascular: Normal rate, regular rhythm.  No murmurs, rubs, or gallops.  Respiratory: Normal respiratory effort without tachypnea nor retractions. Breath sounds are clear and equal bilaterally. No wheezes/rales/rhonchi. Gastrointestinal: Soft and non tender. No rebound. No guarding.  Genitourinary: Deferred Musculoskeletal: Normal range of motion in all extremities. No lower extremity edema. Neurologic:  Normal speech and language. No gross focal neurologic deficits are appreciated.  Skin:  Skin is warm, dry and intact. No rash noted. Psychiatric: Mood and affect are normal. Speech and behavior are normal. Patient exhibits appropriate insight and judgment.  ____________________________________________    LABS (pertinent positives/negatives)  None  ____________________________________________   EKG  None  ____________________________________________    RADIOLOGY  None  ____________________________________________   PROCEDURES  Procedures  ____________________________________________   INITIAL IMPRESSION / ASSESSMENT AND PLAN / ED COURSE  Pertinent labs & imaging results that were available during my care of the patient were reviewed by me and considered in my medical decision making (see chart for details).   Patient presented to the emergency department today after a seizure.  Patient has history of epilepsy.  Patient does not want any work-up done here.  On exam he is awake and alert.  He states he has been taking his medications.    ____________________________________________   FINAL CLINICAL IMPRESSION(S) / ED DIAGNOSES  Final diagnoses:  Seizure [facility])     Note: This dictation  was prepared with Dragon dictation. Any transcriptional errors that result from this process are unintentional     Nance Pear, MD 05/05/20 2126

## 2020-05-16 MED ORDER — NAYZILAM 5 MG/SPRAY (0.1 ML) NASAL SPRAY
5 refills | 0 days | Status: CP
Start: 2020-05-16 — End: ?

## 2020-05-18 MED ORDER — NAYZILAM 5 MG/SPRAY (0.1 ML) NASAL SPRAY: each | 5 refills | 0 days | Status: AC

## 2020-05-18 MED ORDER — NAYZILAM 5 MG/SPRAY (0.1 ML) NASAL SPRAY
5 refills | 0 days | Status: CP
Start: 2020-05-18 — End: 2020-05-18

## 2020-05-26 ENCOUNTER — Ambulatory Visit
Admit: 2020-05-26 | Discharge: 2020-05-26 | Disposition: A | Discharge status: Home / Self Care | Payer: MEDICAID | Attending: Emergency Medicine

## 2020-05-26 DIAGNOSIS — R569 Unspecified convulsions: Principal | ICD-10-CM

## 2020-05-26 MED ORDER — NAYZILAM 5 MG/SPRAY (0.1 ML) NASAL SPRAY
5 refills | 0 days | Status: CP
Start: 2020-05-26 — End: ?

## 2020-05-30 DIAGNOSIS — G40219 Localization-related (focal) (partial) symptomatic epilepsy and epileptic syndromes with complex partial seizures, intractable, without status epilepticus: Principal | ICD-10-CM

## 2020-05-30 DIAGNOSIS — R768 Other specified abnormal immunological findings in serum: Principal | ICD-10-CM

## 2020-06-01 DIAGNOSIS — G40219 Localization-related (focal) (partial) symptomatic epilepsy and epileptic syndromes with complex partial seizures, intractable, without status epilepticus: Principal | ICD-10-CM

## 2020-06-01 DIAGNOSIS — R768 Other specified abnormal immunological findings in serum: Principal | ICD-10-CM

## 2020-06-02 DIAGNOSIS — G40409 Other generalized epilepsy and epileptic syndromes, not intractable, without status epilepticus: Principal | ICD-10-CM

## 2020-06-08 ENCOUNTER — Ambulatory Visit: Admit: 2020-06-08 | Discharge: 2020-06-09 | Payer: MEDICAID

## 2020-06-10 MED ORDER — CENOBAMATE 50 MG (14)-100 MG (14) TABLETS IN A DOSE PACK
ORAL_TABLET | 0 refills | 0 days | Status: CP
Start: 2020-06-10 — End: ?

## 2020-06-13 DIAGNOSIS — G40209 Localization-related (focal) (partial) symptomatic epilepsy and epileptic syndromes with complex partial seizures, not intractable, without status epilepticus: Principal | ICD-10-CM

## 2020-06-13 MED ORDER — CENOBAMATE 50 MG (14)-100 MG (14) TABLETS IN A DOSE PACK
ORAL_TABLET | 0 refills | 0 days | Status: CP
Start: 2020-06-13 — End: ?

## 2020-06-28 MED ORDER — DIVALPROEX ER 250 MG TABLET,EXTENDED RELEASE 24 HR
ORAL_TABLET | 11 refills | 0 days | Status: CP
Start: 2020-06-28 — End: ?

## 2020-07-04 ENCOUNTER — Ambulatory Visit: Admit: 2020-07-04 | Discharge: 2020-07-05 | Payer: MEDICAID

## 2020-07-04 DIAGNOSIS — G40909 Epilepsy, unspecified, not intractable, without status epilepticus: Principal | ICD-10-CM

## 2020-07-04 DIAGNOSIS — G40209 Localization-related (focal) (partial) symptomatic epilepsy and epileptic syndromes with complex partial seizures, not intractable, without status epilepticus: Principal | ICD-10-CM

## 2020-07-04 MED ORDER — CENOBAMATE 150 MG TABLET
ORAL_TABLET | Freq: Every day | ORAL | 3 refills | 0 days | Status: CP
Start: 2020-07-04 — End: ?

## 2020-07-06 DIAGNOSIS — G40909 Epilepsy, unspecified, not intractable, without status epilepticus: Principal | ICD-10-CM

## 2020-07-28 DIAGNOSIS — G40909 Epilepsy, unspecified, not intractable, without status epilepticus: Principal | ICD-10-CM

## 2020-07-28 DIAGNOSIS — G40209 Localization-related (focal) (partial) symptomatic epilepsy and epileptic syndromes with complex partial seizures, not intractable, without status epilepticus: Principal | ICD-10-CM

## 2020-07-28 MED ORDER — CENOBAMATE 150 MG TABLET
ORAL_TABLET | Freq: Every day | ORAL | 3 refills | 0.00000 days | Status: CP
Start: 2020-07-28 — End: 2020-07-28

## 2020-07-28 MED ORDER — CENOBAMATE 150 MG TABLET: 150 mg | tablet | Freq: Every day | 3 refills | 0 days | Status: AC

## 2020-07-28 MED ORDER — LAMOTRIGINE 100 MG TABLET
ORAL_TABLET | ORAL | 11 refills | 0.00000 days | Status: CP
Start: 2020-07-28 — End: 2020-07-28

## 2020-07-28 MED ORDER — LAMOTRIGINE 100 MG TABLET: tablet | 11 refills | 0 days | Status: AC

## 2020-07-28 MED ORDER — LEVOCARNITINE 500 MG TABLET
ORAL_TABLET | Freq: Two times a day (BID) | ORAL | 11 refills | 0 days | Status: CP
Start: 2020-07-28 — End: ?

## 2020-07-28 MED ORDER — DIVALPROEX ER 250 MG TABLET,EXTENDED RELEASE 24 HR
ORAL_TABLET | Freq: Two times a day (BID) | ORAL | 11 refills | 30.00000 days | Status: CP
Start: 2020-07-28 — End: 2020-07-28

## 2020-07-28 MED ORDER — DIVALPROEX ER 250 MG TABLET,EXTENDED RELEASE 24 HR: 250 mg | tablet | Freq: Two times a day (BID) | 11 refills | 30 days | Status: AC

## 2020-08-05 ENCOUNTER — Ambulatory Visit: Admit: 2020-08-05 | Discharge: 2020-08-06 | Payer: MEDICAID | Attending: Neurology | Primary: Neurology

## 2020-08-05 DIAGNOSIS — R768 Other specified abnormal immunological findings in serum: Principal | ICD-10-CM

## 2020-08-05 DIAGNOSIS — G40219 Localization-related (focal) (partial) symptomatic epilepsy and epileptic syndromes with complex partial seizures, intractable, without status epilepticus: Principal | ICD-10-CM

## 2020-08-09 DIAGNOSIS — G40209 Localization-related (focal) (partial) symptomatic epilepsy and epileptic syndromes with complex partial seizures, not intractable, without status epilepticus: Principal | ICD-10-CM

## 2020-08-09 MED ORDER — LORAZEPAM 1 MG TABLET
ORAL_TABLET | ORAL | 1 refills | 6.00000 days | Status: CP
Start: 2020-08-09 — End: 2020-08-15

## 2020-08-09 MED ORDER — LORAZEPAM 1 MG TABLET: tablet | 1 refills | 6 days | Status: AC

## 2020-08-09 MED ORDER — XCOPRI 200 MG TABLET
ORAL_TABLET | Freq: Every day | ORAL | 5 refills | 0.00000 days | Status: CP
Start: 2020-08-09 — End: 2021-02-22

## 2020-08-25 ENCOUNTER — Ambulatory Visit: Admit: 2020-08-25 | Discharge: 2020-08-26 | Payer: MEDICAID

## 2020-08-29 ENCOUNTER — Ambulatory Visit: Admit: 2020-08-29 | Discharge: 2020-08-30 | Disposition: A | Discharge status: Home / Self Care | Payer: MEDICAID

## 2020-08-29 MED ORDER — ONDANSETRON 4 MG DISINTEGRATING TABLET
ORAL_TABLET | Freq: Three times a day (TID) | ORAL | 0 refills | 5 days | Status: CP | PRN
Start: 2020-08-29 — End: 2020-09-05

## 2020-08-29 MED ORDER — ACETAMINOPHEN-CAFFEINE 500 MG-65 MG TABLET
ORAL_TABLET | Freq: Four times a day (QID) | ORAL | 0 refills | 5.00000 days | Status: CP
Start: 2020-08-29 — End: 2020-09-03

## 2020-08-29 MED ORDER — DIPHENHYDRAMINE 25 MG CAPSULE
ORAL_CAPSULE | Freq: Four times a day (QID) | ORAL | 0 refills | 5 days | Status: CP | PRN
Start: 2020-08-29 — End: ?

## 2020-08-31 DIAGNOSIS — G0481 Other encephalitis and encephalomyelitis: Principal | ICD-10-CM

## 2020-09-12 ENCOUNTER — Ambulatory Visit: Admit: 2020-09-12 | Discharge: 2020-09-13 | Payer: MEDICAID

## 2020-09-13 ENCOUNTER — Ambulatory Visit: Admit: 2020-09-13 | Discharge: 2020-09-14 | Payer: MEDICAID

## 2020-09-14 ENCOUNTER — Ambulatory Visit: Admit: 2020-09-14 | Discharge: 2020-09-15 | Payer: MEDICAID

## 2020-09-19 ENCOUNTER — Telehealth: Admit: 2020-09-19 | Discharge: 2020-09-20 | Payer: MEDICAID | Attending: Neurology | Primary: Neurology

## 2020-09-22 ENCOUNTER — Ambulatory Visit: Admit: 2020-09-22 | Discharge: 2020-09-23 | Payer: MEDICAID

## 2020-09-22 DIAGNOSIS — G0481 Other encephalitis and encephalomyelitis: Principal | ICD-10-CM

## 2020-10-10 ENCOUNTER — Ambulatory Visit: Admit: 2020-10-10 | Discharge: 2020-10-10 | Payer: MEDICAID

## 2020-10-10 DIAGNOSIS — Z6826 Body mass index (BMI) 26.0-26.9, adult: Principal | ICD-10-CM

## 2020-10-10 DIAGNOSIS — G40219 Localization-related (focal) (partial) symptomatic epilepsy and epileptic syndromes with complex partial seizures, intractable, without status epilepticus: Principal | ICD-10-CM

## 2020-10-10 DIAGNOSIS — G0481 Other encephalitis and encephalomyelitis: Principal | ICD-10-CM

## 2020-10-10 MED ORDER — NAYZILAM 5 MG/SPRAY (0.1 ML) NASAL SPRAY
5 refills | 0 days | Status: CP
Start: 2020-10-10 — End: ?

## 2020-10-10 MED ORDER — XCOPRI 200 MG TABLET
ORAL_TABLET | Freq: Every day | ORAL | 5 refills | 0.00000 days | Status: CP
Start: 2020-10-10 — End: 2021-04-25

## 2020-10-12 ENCOUNTER — Ambulatory Visit: Admit: 2020-10-12 | Discharge: 2020-10-13 | Payer: MEDICAID

## 2020-10-12 DIAGNOSIS — Z5181 Encounter for therapeutic drug level monitoring: Principal | ICD-10-CM

## 2020-10-12 DIAGNOSIS — G0481 Other encephalitis and encephalomyelitis: Principal | ICD-10-CM

## 2020-10-13 ENCOUNTER — Ambulatory Visit: Admit: 2020-10-13 | Discharge: 2020-10-14 | Payer: MEDICAID

## 2020-10-13 DIAGNOSIS — G0481 Other encephalitis and encephalomyelitis: Principal | ICD-10-CM

## 2020-10-20 DIAGNOSIS — G0481 Other encephalitis and encephalomyelitis: Principal | ICD-10-CM

## 2020-10-20 DIAGNOSIS — D84821 Immunosuppression due to drug therapy (CMS-HCC): Principal | ICD-10-CM

## 2020-10-20 DIAGNOSIS — D849 Immunodeficiency, unspecified: Principal | ICD-10-CM

## 2020-10-20 DIAGNOSIS — Z79899 Other long term (current) drug therapy: Principal | ICD-10-CM

## 2020-10-20 MED ORDER — MYCOPHENOLATE MOFETIL 250 MG CAPSULE
ORAL_CAPSULE | ORAL | 2 refills | 0.00000 days | Status: CP
Start: 2020-10-20 — End: 2020-12-22

## 2020-10-20 MED ORDER — SULFAMETHOXAZOLE 400 MG-TRIMETHOPRIM 80 MG TABLET
ORAL_TABLET | Freq: Every day | ORAL | 11 refills | 30 days | Status: CP
Start: 2020-10-20 — End: 2020-12-22

## 2020-10-24 ENCOUNTER — Ambulatory Visit: Admit: 2020-10-24 | Discharge: 2020-10-25 | Disposition: A | Discharge status: Home / Self Care | Payer: MEDICAID

## 2020-10-24 ENCOUNTER — Emergency Department: Admit: 2020-10-24 | Discharge: 2020-10-25 | Disposition: A | Discharge status: Home / Self Care | Payer: MEDICAID

## 2020-10-24 DIAGNOSIS — Z79890 Hormone replacement therapy (postmenopausal): Principal | ICD-10-CM

## 2020-10-24 DIAGNOSIS — Z20822 Contact with and (suspected) exposure to covid-19: Principal | ICD-10-CM

## 2020-10-24 DIAGNOSIS — F17210 Nicotine dependence, cigarettes, uncomplicated: Principal | ICD-10-CM

## 2020-10-24 DIAGNOSIS — R112 Nausea with vomiting, unspecified: Principal | ICD-10-CM

## 2020-10-24 DIAGNOSIS — Z792 Long term (current) use of antibiotics: Principal | ICD-10-CM

## 2020-10-24 DIAGNOSIS — Z79899 Other long term (current) drug therapy: Principal | ICD-10-CM

## 2020-10-24 DIAGNOSIS — G0481 Other encephalitis and encephalomyelitis: Principal | ICD-10-CM

## 2020-10-24 DIAGNOSIS — R6883 Chills (without fever): Principal | ICD-10-CM

## 2020-10-24 DIAGNOSIS — F209 Schizophrenia, unspecified: Principal | ICD-10-CM

## 2020-10-24 DIAGNOSIS — R569 Unspecified convulsions: Principal | ICD-10-CM

## 2020-10-24 DIAGNOSIS — G40909 Epilepsy, unspecified, not intractable, without status epilepticus: Principal | ICD-10-CM

## 2020-10-25 DIAGNOSIS — D708 Other neutropenia: Principal | ICD-10-CM

## 2020-10-25 DIAGNOSIS — G0481 Other encephalitis and encephalomyelitis: Principal | ICD-10-CM

## 2020-10-25 DIAGNOSIS — D72818 Other decreased white blood cell count: Principal | ICD-10-CM

## 2020-11-09 ENCOUNTER — Ambulatory Visit: Admit: 2020-11-09 | Discharge: 2020-11-09 | Disposition: A | Discharge status: Home / Self Care | Payer: MEDICAID

## 2020-11-09 DIAGNOSIS — D72818 Other decreased white blood cell count: Principal | ICD-10-CM

## 2020-11-09 DIAGNOSIS — Z79899 Other long term (current) drug therapy: Principal | ICD-10-CM

## 2020-11-09 DIAGNOSIS — R9431 Abnormal electrocardiogram [ECG] [EKG]: Principal | ICD-10-CM

## 2020-11-09 DIAGNOSIS — F209 Schizophrenia, unspecified: Principal | ICD-10-CM

## 2020-11-09 DIAGNOSIS — G40901 Epilepsy, unspecified, not intractable, with status epilepticus: Principal | ICD-10-CM

## 2020-11-09 DIAGNOSIS — R112 Nausea with vomiting, unspecified: Principal | ICD-10-CM

## 2020-11-09 DIAGNOSIS — Z79890 Hormone replacement therapy (postmenopausal): Principal | ICD-10-CM

## 2020-11-09 DIAGNOSIS — G40909 Epilepsy, unspecified, not intractable, without status epilepticus: Principal | ICD-10-CM

## 2020-11-09 DIAGNOSIS — Z87820 Personal history of traumatic brain injury: Principal | ICD-10-CM

## 2020-11-09 DIAGNOSIS — F17210 Nicotine dependence, cigarettes, uncomplicated: Principal | ICD-10-CM

## 2020-11-09 DIAGNOSIS — D849 Immunodeficiency, unspecified: Principal | ICD-10-CM

## 2020-11-09 DIAGNOSIS — D708 Other neutropenia: Principal | ICD-10-CM

## 2020-11-09 DIAGNOSIS — R569 Unspecified convulsions: Principal | ICD-10-CM

## 2020-11-09 DIAGNOSIS — G0481 Other encephalitis and encephalomyelitis: Principal | ICD-10-CM

## 2020-11-09 MED ORDER — CENOBAMATE 50 MG TABLET
ORAL_TABLET | 5 refills | 0 days | Status: CP
Start: 2020-11-09 — End: ?

## 2020-11-09 MED ORDER — CLOBAZAM 10 MG ORAL FILM
5 refills | 0 days | Status: CP
Start: 2020-11-09 — End: 2020-11-09

## 2020-11-10 ENCOUNTER — Ambulatory Visit: Admit: 2020-11-10 | Discharge: 2020-11-11 | Payer: MEDICAID

## 2020-11-10 DIAGNOSIS — G0481 Other encephalitis and encephalomyelitis: Principal | ICD-10-CM

## 2020-11-10 DIAGNOSIS — D708 Other neutropenia: Principal | ICD-10-CM

## 2020-11-10 DIAGNOSIS — Z79899 Other long term (current) drug therapy: Principal | ICD-10-CM

## 2020-11-10 DIAGNOSIS — D72818 Other decreased white blood cell count: Principal | ICD-10-CM

## 2020-11-10 MED ORDER — ONDANSETRON HCL (PF) 4 MG/2 ML INJECTION SOLUTION
Freq: Once | INTRAVENOUS | 0 refills | 1 days | Status: CP
Start: 2020-11-10 — End: 2020-11-10

## 2020-11-10 MED ORDER — LORAZEPAM 2 MG/ML INJECTION SOLUTION
Freq: Once | INTRAVENOUS | 0 refills | 2 days | Status: CP
Start: 2020-11-10 — End: 2020-11-10

## 2020-11-10 MED ORDER — LORAZEPAM 1 MG TABLET
ORAL_TABLET | 1 refills | 0 days | Status: CP
Start: 2020-11-10 — End: ?

## 2020-11-10 MED ORDER — ONDANSETRON 4 MG DISINTEGRATING TABLET
ORAL_TABLET | Freq: Three times a day (TID) | ORAL | 0 refills | 10 days | Status: CP | PRN
Start: 2020-11-10 — End: 2020-11-20

## 2020-12-07 ENCOUNTER — Ambulatory Visit: Admit: 2020-12-07 | Discharge: 2020-12-08 | Payer: MEDICAID

## 2020-12-07 DIAGNOSIS — D72818 Other decreased white blood cell count: Principal | ICD-10-CM

## 2020-12-07 DIAGNOSIS — D708 Other neutropenia: Principal | ICD-10-CM

## 2020-12-07 DIAGNOSIS — G0481 Other encephalitis and encephalomyelitis: Principal | ICD-10-CM

## 2020-12-08 ENCOUNTER — Ambulatory Visit: Admit: 2020-12-08 | Discharge: 2020-12-09 | Payer: MEDICAID

## 2020-12-08 DIAGNOSIS — D708 Other neutropenia: Principal | ICD-10-CM

## 2020-12-08 DIAGNOSIS — D849 Immunodeficiency, unspecified: Principal | ICD-10-CM

## 2020-12-08 DIAGNOSIS — G0481 Other encephalitis and encephalomyelitis: Principal | ICD-10-CM

## 2020-12-08 DIAGNOSIS — Z79899 Other long term (current) drug therapy: Principal | ICD-10-CM

## 2020-12-08 DIAGNOSIS — D72818 Other decreased white blood cell count: Principal | ICD-10-CM

## 2020-12-22 ENCOUNTER — Telehealth: Admit: 2020-12-22 | Discharge: 2020-12-23 | Payer: MEDICAID | Attending: Neurology | Primary: Neurology

## 2020-12-22 MED ORDER — SULFAMETHOXAZOLE 400 MG-TRIMETHOPRIM 80 MG TABLET
ORAL_TABLET | Freq: Every day | ORAL | 11 refills | 30 days | Status: CP
Start: 2020-12-22 — End: ?

## 2020-12-22 MED ORDER — MYCOPHENOLATE MOFETIL 250 MG CAPSULE
ORAL_CAPSULE | PRN refills | 0 days | Status: CP
Start: 2020-12-22 — End: 2020-12-22

## 2021-01-01 DIAGNOSIS — D72818 Other decreased white blood cell count: Principal | ICD-10-CM

## 2021-01-01 DIAGNOSIS — G0481 Other encephalitis and encephalomyelitis: Principal | ICD-10-CM

## 2021-01-01 DIAGNOSIS — D708 Other neutropenia: Principal | ICD-10-CM

## 2021-01-04 ENCOUNTER — Ambulatory Visit: Admit: 2021-01-04 | Discharge: 2021-01-05 | Payer: MEDICAID

## 2021-01-04 DIAGNOSIS — G0481 Other encephalitis and encephalomyelitis: Principal | ICD-10-CM

## 2021-01-04 DIAGNOSIS — D72818 Other decreased white blood cell count: Principal | ICD-10-CM

## 2021-01-04 DIAGNOSIS — D708 Other neutropenia: Principal | ICD-10-CM

## 2021-01-05 ENCOUNTER — Ambulatory Visit: Admit: 2021-01-05 | Discharge: 2021-01-06 | Payer: MEDICAID

## 2021-01-05 DIAGNOSIS — G0481 Other encephalitis and encephalomyelitis: Principal | ICD-10-CM

## 2021-01-05 DIAGNOSIS — Z79899 Other long term (current) drug therapy: Principal | ICD-10-CM

## 2021-01-05 DIAGNOSIS — D708 Other neutropenia: Principal | ICD-10-CM

## 2021-01-05 DIAGNOSIS — D72818 Other decreased white blood cell count: Principal | ICD-10-CM

## 2021-01-10 DIAGNOSIS — D84821 Immunosuppression due to drug therapy (CMS-HCC): Principal | ICD-10-CM

## 2021-01-10 DIAGNOSIS — Z79899 Other long term (current) drug therapy: Principal | ICD-10-CM

## 2021-01-16 DIAGNOSIS — D849 Immunodeficiency, unspecified: Principal | ICD-10-CM

## 2021-01-16 DIAGNOSIS — D84821 Immunosuppression due to drug therapy (CMS-HCC): Principal | ICD-10-CM

## 2021-01-16 DIAGNOSIS — Z79899 Other long term (current) drug therapy: Principal | ICD-10-CM

## 2021-01-16 DIAGNOSIS — G0481 Other encephalitis and encephalomyelitis: Principal | ICD-10-CM

## 2021-01-17 ENCOUNTER — Ambulatory Visit: Admit: 2021-01-16 | Discharge: 2021-01-17 | Discharge status: Against Medical Advice | Payer: MEDICAID

## 2021-01-17 ENCOUNTER — Ambulatory Visit: Admit: 2021-01-17 | Discharge: 2021-01-17 | Discharge status: Against Medical Advice | Payer: MEDICAID

## 2021-01-17 DIAGNOSIS — Z79899 Other long term (current) drug therapy: Principal | ICD-10-CM

## 2021-01-17 DIAGNOSIS — D84821 Immunosuppression due to drug therapy (CMS-HCC): Principal | ICD-10-CM

## 2021-01-17 DIAGNOSIS — G0481 Other encephalitis and encephalomyelitis: Principal | ICD-10-CM

## 2021-01-20 ENCOUNTER — Ambulatory Visit: Admit: 2021-01-20 | Discharge: 2021-01-21 | Payer: MEDICAID

## 2021-01-20 DIAGNOSIS — G0481 Other encephalitis and encephalomyelitis: Principal | ICD-10-CM

## 2021-01-20 DIAGNOSIS — R1110 Vomiting, unspecified: Principal | ICD-10-CM

## 2021-02-01 ENCOUNTER — Ambulatory Visit: Admit: 2021-02-01 | Discharge: 2021-02-02 | Payer: MEDICAID

## 2021-02-02 ENCOUNTER — Ambulatory Visit: Admit: 2021-02-02 | Discharge: 2021-02-03 | Payer: MEDICAID

## 2021-02-13 ENCOUNTER — Ambulatory Visit: Admit: 2021-02-13 | Discharge: 2021-02-14 | Payer: MEDICAID

## 2021-02-13 DIAGNOSIS — R112 Nausea with vomiting, unspecified: Principal | ICD-10-CM

## 2021-02-17 ENCOUNTER — Ambulatory Visit: Admit: 2021-02-17 | Discharge: 2021-02-17 | Payer: MEDICAID

## 2021-02-17 ENCOUNTER — Ambulatory Visit: Admit: 2021-02-17 | Discharge: 2021-02-17 | Payer: MEDICAID | Attending: Neurology | Primary: Neurology

## 2021-02-17 DIAGNOSIS — R768 Other specified abnormal immunological findings in serum: Principal | ICD-10-CM

## 2021-02-17 DIAGNOSIS — R1110 Vomiting, unspecified: Principal | ICD-10-CM

## 2021-02-17 DIAGNOSIS — Z79899 Other long term (current) drug therapy: Principal | ICD-10-CM

## 2021-02-17 DIAGNOSIS — D84821 Immunosuppression due to drug therapy (CMS-HCC): Principal | ICD-10-CM

## 2021-02-17 DIAGNOSIS — G40209 Localization-related (focal) (partial) symptomatic epilepsy and epileptic syndromes with complex partial seizures, not intractable, without status epilepticus: Principal | ICD-10-CM

## 2021-02-17 DIAGNOSIS — R748 Abnormal levels of other serum enzymes: Principal | ICD-10-CM

## 2021-02-17 DIAGNOSIS — G0481 Other encephalitis and encephalomyelitis: Principal | ICD-10-CM

## 2021-02-17 DIAGNOSIS — D849 Immunodeficiency, unspecified: Principal | ICD-10-CM

## 2021-02-17 DIAGNOSIS — G40219 Localization-related (focal) (partial) symptomatic epilepsy and epileptic syndromes with complex partial seizures, intractable, without status epilepticus: Principal | ICD-10-CM

## 2021-02-21 ENCOUNTER — Ambulatory Visit: Admit: 2021-02-21 | Discharge: 2021-02-21 | Discharge status: Against Medical Advice | Payer: MEDICAID

## 2021-03-01 ENCOUNTER — Ambulatory Visit: Admit: 2021-03-01 | Discharge: 2021-03-02 | Payer: MEDICAID

## 2021-03-02 ENCOUNTER — Ambulatory Visit: Admit: 2021-03-02 | Discharge: 2021-03-03 | Payer: MEDICAID

## 2021-03-27 MED ORDER — XCOPRI 150 MG TABLET
ORAL_TABLET | Freq: Every day | ORAL | 5 refills | 0 days | Status: CP
Start: 2021-03-27 — End: 2021-09-30

## 2021-03-29 ENCOUNTER — Ambulatory Visit: Admit: 2021-03-29 | Discharge: 2021-03-30 | Payer: MEDICAID

## 2021-03-30 ENCOUNTER — Ambulatory Visit: Admit: 2021-03-30 | Discharge: 2021-03-31 | Payer: MEDICAID

## 2021-04-06 ENCOUNTER — Ambulatory Visit
Admit: 2021-04-06 | Discharge: 2021-04-07 | Payer: MEDICAID | Attending: Student in an Organized Health Care Education/Training Program | Primary: Student in an Organized Health Care Education/Training Program

## 2021-04-26 ENCOUNTER — Ambulatory Visit: Admit: 2021-04-26 | Discharge: 2021-04-27 | Payer: MEDICAID

## 2021-04-26 ENCOUNTER — Ambulatory Visit
Admit: 2021-04-26 | Discharge: 2021-04-27 | Disposition: A | Discharge status: Other Acute Inpatient Hospital | Payer: MEDICAID

## 2021-04-27 ENCOUNTER — Ambulatory Visit: Admit: 2021-04-27 | Payer: MEDICAID

## 2021-05-02 MED ORDER — XCOPRI 150 MG TABLET
ORAL_TABLET | Freq: Every day | ORAL | 0 refills | 7 days | Status: CP
Start: 2021-05-02 — End: 2021-05-09

## 2021-05-05 DIAGNOSIS — G0481 Other encephalitis and encephalomyelitis: Principal | ICD-10-CM

## 2021-06-07 ENCOUNTER — Ambulatory Visit: Admit: 2021-06-07 | Discharge: 2021-06-08 | Payer: MEDICAID

## 2021-06-08 ENCOUNTER — Ambulatory Visit: Admit: 2021-06-08 | Discharge: 2021-06-09 | Payer: MEDICAID

## 2021-06-14 ENCOUNTER — Ambulatory Visit: Admit: 2021-06-14 | Discharge: 2021-06-15 | Payer: MEDICAID | Attending: Neurology | Primary: Neurology

## 2021-06-14 DIAGNOSIS — R768 Other specified abnormal immunological findings in serum: Principal | ICD-10-CM

## 2021-06-14 DIAGNOSIS — G0481 Other encephalitis and encephalomyelitis: Principal | ICD-10-CM

## 2021-07-04 DIAGNOSIS — G40409 Other generalized epilepsy and epileptic syndromes, not intractable, without status epilepticus: Principal | ICD-10-CM

## 2021-07-05 ENCOUNTER — Ambulatory Visit
Admit: 2021-07-05 | Discharge: 2021-07-06 | Disposition: A | Discharge status: Home / Self Care | Payer: MEDICAID | Source: Ambulatory Visit

## 2021-07-05 ENCOUNTER — Ambulatory Visit
Admit: 2021-07-04 | Discharge: 2021-07-05 | Disposition: A | Discharge status: Home / Self Care | Payer: MEDICAID | Source: Ambulatory Visit | Attending: Student in an Organized Health Care Education/Training Program | Primary: Student in an Organized Health Care Education/Training Program

## 2021-07-05 DIAGNOSIS — G40409 Other generalized epilepsy and epileptic syndromes, not intractable, without status epilepticus: Principal | ICD-10-CM

## 2021-07-05 MED ORDER — CENOBAMATE 50 MG TABLET
ORAL_TABLET | Freq: Every evening | ORAL | 0 refills | 0.00000 days | Status: CP
Start: 2021-07-05 — End: ?

## 2021-07-06 MED ORDER — CANNABIDIOL 100 MG/ML ORAL SOLUTION
Freq: Every evening | ORAL | 0 refills | 30.00000 days | Status: CP
Start: 2021-07-06 — End: 2021-08-05

## 2021-07-11 ENCOUNTER — Ambulatory Visit: Admit: 2021-07-11 | Discharge: 2021-07-12

## 2021-07-11 MED ORDER — LORAZEPAM 1 MG TABLET
ORAL_TABLET | ORAL | 1 refills | 0.00000 days | Status: CP
Start: 2021-07-11 — End: ?
  Filled 2021-07-11: qty 30, 34d supply, fill #0

## 2021-07-18 ENCOUNTER — Ambulatory Visit: Admit: 2021-07-18 | Discharge: 2021-07-19 | Payer: MEDICAID

## 2021-07-19 ENCOUNTER — Ambulatory Visit: Admit: 2021-07-19 | Discharge: 2021-07-20 | Payer: MEDICAID

## 2021-07-25 ENCOUNTER — Ambulatory Visit: Admit: 2021-07-25 | Discharge: 2021-07-25 | Payer: MEDICAID

## 2021-07-28 MED ORDER — LORAZEPAM 1 MG TABLET
ORAL_TABLET | 2 refills | 0 days | Status: CP
Start: 2021-07-28 — End: ?

## 2021-08-03 DIAGNOSIS — G40409 Other generalized epilepsy and epileptic syndromes, not intractable, without status epilepticus: Principal | ICD-10-CM

## 2021-08-04 MED ORDER — CENOBAMATE 350 MG/DAY (200 MG X 1 AND 150 MG X 1) TABLETS
ORAL_TABLET | Freq: Every day | ORAL | 2 refills | 0 days | Status: CP
Start: 2021-08-04 — End: ?

## 2021-08-04 MED ORDER — CENOBAMATE 50 MG TABLET
ORAL_TABLET | Freq: Every evening | ORAL | 2 refills | 0.00000 days | Status: CP
Start: 2021-08-04 — End: 2021-08-04

## 2021-08-08 ENCOUNTER — Ambulatory Visit: Admit: 2021-08-08 | Discharge: 2021-08-09 | Payer: MEDICAID

## 2021-08-08 DIAGNOSIS — G40409 Other generalized epilepsy and epileptic syndromes, not intractable, without status epilepticus: Principal | ICD-10-CM

## 2021-08-08 DIAGNOSIS — R768 Other specified abnormal immunological findings in serum: Principal | ICD-10-CM

## 2021-08-11 DIAGNOSIS — G40209 Localization-related (focal) (partial) symptomatic epilepsy and epileptic syndromes with complex partial seizures, not intractable, without status epilepticus: Principal | ICD-10-CM

## 2021-08-11 MED ORDER — LAMOTRIGINE 100 MG TABLET
ORAL_TABLET | ORAL | 11 refills | 0.00000 days | Status: CP
Start: 2021-08-11 — End: ?

## 2021-08-14 ENCOUNTER — Ambulatory Visit: Admit: 2021-08-14 | Discharge: 2021-08-15 | Payer: MEDICAID

## 2021-08-15 ENCOUNTER — Ambulatory Visit: Admit: 2021-08-15 | Discharge: 2021-08-16 | Payer: MEDICAID

## 2021-09-04 DIAGNOSIS — D72818 Other decreased white blood cell count: Principal | ICD-10-CM

## 2021-09-04 DIAGNOSIS — G0481 Other encephalitis and encephalomyelitis: Principal | ICD-10-CM

## 2021-09-04 DIAGNOSIS — D708 Other neutropenia: Principal | ICD-10-CM

## 2021-09-07 ENCOUNTER — Emergency Department (HOSPITAL_COMMUNITY)
Admission: EM | Admit: 2021-09-07 | Discharge: 2021-09-07 | Disposition: A | Discharge status: Home / Self Care | Payer: Medicaid Other | Attending: Emergency Medicine | Admitting: Emergency Medicine

## 2021-09-07 ENCOUNTER — Other Ambulatory Visit: Payer: Self-pay

## 2021-09-07 ENCOUNTER — Encounter (HOSPITAL_COMMUNITY): Payer: Self-pay | Admitting: Emergency Medicine

## 2021-09-07 ENCOUNTER — Ambulatory Visit: Admit: 2021-09-07 | Discharge: 2021-09-08 | Payer: MEDICAID | Attending: Neurology | Primary: Neurology

## 2021-09-07 DIAGNOSIS — R569 Unspecified convulsions: Secondary | ICD-10-CM | POA: Diagnosis present

## 2021-09-07 DIAGNOSIS — E039 Hypothyroidism, unspecified: Secondary | ICD-10-CM | POA: Insufficient documentation

## 2021-09-07 DIAGNOSIS — G40909 Epilepsy, unspecified, not intractable, without status epilepticus: Secondary | ICD-10-CM | POA: Diagnosis not present

## 2021-09-07 DIAGNOSIS — Z79899 Other long term (current) drug therapy: Secondary | ICD-10-CM | POA: Diagnosis not present

## 2021-09-07 DIAGNOSIS — F17210 Nicotine dependence, cigarettes, uncomplicated: Secondary | ICD-10-CM | POA: Insufficient documentation

## 2021-09-07 DIAGNOSIS — Z8616 Personal history of COVID-19: Secondary | ICD-10-CM | POA: Insufficient documentation

## 2021-09-07 LAB — CBG MONITORING, ED: Glucose-Capillary: 88 mg/dL (ref 70–99)

## 2021-09-07 NOTE — ED Notes (Signed)
Per MD Vira Agar, attempted to reach out to legal guardian per number in chart.

## 2021-09-07 NOTE — ED Provider Notes (Signed)
[facility] EMERGENCY DEPARTMENT Provider Note   CSN: [id number] Arrival date & time: 09/07/21  1733     History Chief Complaint  Patient presents with   Seizures    Daniel Craig is a 30 y.o. male.  HPI This is a 30 year old male with history of schizophrenia and seizures who presents after reported seizure.  Patient resides at a group home and he reportedly had a tonic-clonic, generalized seizure just prior to arrival that lasted approximately 1 minute.  No meds were given and the seizure ended without intervention.  No head trauma or other injuries.  Patient was reportedly postictal for EMS in route but is at his reported baseline on arrival.  He denies any recent sick symptoms including fever.  He denies any headache or neck pain.  Reports compliance with his medications and denies any ingestions.    Past Medical History:  Diagnosis Date   COVID-14 Nov 2019   Schizophrenia [facility])    Seizures 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   Altered mental status 12/28/2017   Fever 08/11/2017   Acute URI 08/11/2017   Seizures (HCC) 06/25/2017    Past Surgical History:  Procedure Laterality Date   MOUTH SURGERY         Family History  Family history unknown: Yes    Social History   Tobacco Use   Smoking status: Every Day    Packs/day: 1.00    Types: Cigarettes   Smokeless tobacco: Never  Vaping Use   Vaping Use: Never used  Substance Use Topics   Alcohol use: Yes    Alcohol/week: 4.0 standard drinks    Types: 4 Shots  of liquor per week    Comment: pt states 4 shots of liqour oer week   Drug use: Yes    Types: Marijuana    Home Medications Prior to Admission medications   Medication Sig Start Date End Date Taking? Authorizing Provider  Ascorbic Acid (VITAMIN C) 1000 MG tablet Take 1,000 mg by mouth daily.    [provider]  divalproex (DEPAKOTE) 500 MG DR tablet Take 1,000 mg by mouth 2 (two) times daily.    [provider]  lamoTRIgine (LAMICTAL) 100 MG tablet Take 1 tablet (100 mg total) by mouth every morning. 08/11/19   Enid Baas Jude, MD  lamoTRIgine (LAMICTAL) 200 MG tablet Take 1 tablet (200 mg total) by mouth at bedtime. 08/10/19   Enid Baas Jude, MD  levothyroxine (SYNTHROID) 50 MCG tablet Take 50 mcg by mouth daily before breakfast.    [provider]  propranolol ER (INDERAL LA) 60 MG 24 hr capsule Take 60 mg by mouth daily.    [provider]  risperiDONE (RISPERDAL) 2 MG tablet Take 2 mg by mouth 2 (two) times daily.    [provider]  traZODone (DESYREL) 100 MG tablet Take 200 mg by mouth at bedtime.     [provider]    Allergies    Keppra [levetiracetam], Trileptal [oxcarbazepine], and Vimpat [lacosamide]  Review of Systems   Review of Systems  Constitutional:  Negative for  chills and fever.  HENT:  Negative for ear pain and sore throat.   Eyes:  Negative for pain and visual disturbance.  Respiratory:  Negative for cough and shortness of breath.   Cardiovascular:  Negative for chest pain and palpitations.  Gastrointestinal:  Negative for abdominal pain and vomiting.  Genitourinary:  Negative for dysuria and hematuria.  Musculoskeletal:  Negative for arthralgias and back pain.  Skin:  Negative for color change and rash.  Neurological:  Positive for seizures. Negative for syncope.  All other systems reviewed and are negative.  Physical Exam Updated Vital Signs BP 114/72   Pulse 78   Temp 98.8 F (37.1 C) (Oral)   Resp 13   Ht  6' (1.829 m)   Wt 84.4 kg   SpO2 100%   BMI 25.23 kg/m   Physical Exam Vitals and nursing note reviewed.  Constitutional:      Appearance: He is well-developed.  HENT:     Head: Normocephalic and atraumatic.  Eyes:     Extraocular Movements: Extraocular movements intact.     Conjunctiva/sclera: Conjunctivae normal.     Pupils: Pupils are equal, round, and reactive to light.  Cardiovascular:     Rate and Rhythm: Normal rate and regular rhythm.     Heart sounds: No murmur heard. Pulmonary:     Effort: Pulmonary effort is normal. No respiratory distress.     Breath sounds: Normal breath sounds.  Abdominal:     Palpations: Abdomen is soft.     Tenderness: There is no abdominal tenderness.  Musculoskeletal:     Cervical back: Neck supple. No rigidity or tenderness.  Skin:    General: Skin is warm and dry.  Neurological:     General: No focal deficit present.     Mental Status: He is alert.     Cranial Nerves: No cranial nerve deficit.     Sensory: No sensory deficit.     Motor: No weakness.     Coordination: Coordination normal.    ED Results / Procedures / Treatments   Labs (all labs ordered are listed, but only abnormal results are displayed) Labs Reviewed  CBG MONITORING, ED    EKG None  Radiology No results found.  Procedures Procedures   Medications Ordered in ED Medications - No data to display  ED Course  I have reviewed the triage vital signs and the nursing notes.  Pertinent labs & imaging results that were available during my care of the patient were reviewed by me and considered in my medical decision making (see chart for details).    MDM Rules/Calculators/A&P                          On initial evaluation patient is hemodynamically stable and well-appearing.  A&O x4 and with nonfocal neurologic exam.  No recent sick symptoms and patient has no evidence of infection on exam.  No meningismus.  Has been compliant with his medications and denies  all ingestions.  Seizure likely secondary to patient's known history of epilepsy.  He follows with [facility] neurology and has a complex medication regimen due to several allergies including an allergy to Keppra.  He reports that he typically has a seizure every few months.  Denies any increase in seizure frequency.  No signs of trauma to the head or neck on exam.  No evidence of infectious, traumatic, or metabolic cause of seizures.   Patient monitored for 2 hours without recurrent  seizures and remains alert and oriented.  He is stable for discharge home.  Will contact group home for transportation.  Advised patient to call his neurologist tomorrow to schedule follow-up appointment and continue all medications as prescribed.  Given seizure precautions including instructions to abstain from driving, climbing, swimming, or bathing alone, and strict return precautions.  Discharged home in stable condition.  Final Clinical Impression(s) / ED Diagnoses Final diagnoses:  Seizure disorder [facility])    Rx / DC Orders ED Discharge Orders     None        Doran Clay, MD 09/07/21 2123    Cathren Laine, MD 09/11/21 [phone]

## 2021-09-07 NOTE — ED Triage Notes (Signed)
Pt BIB [facility] from group home. Pt had witnessed grand-mal seizure like activity lasting 1-1.5 min. Hx of seizures. Pt states he took daily meds today. Pt postictal on scene. HR initially 110, RR 30, diaphoretic. Pt HR 80s on arrival to ER. CBG 107, Spo2 98% RA.

## 2021-09-07 NOTE — ED Notes (Addendum)
This RN spoke with Campbell Stall, [phone] from group home, group home arranging transport at appx [phone].

## 2021-09-07 NOTE — Discharge Instructions (Signed)
Call your neurologist tomorrow morning to report seizure and schedule follow-up in their office.  Continue to take all your medications as prescribed.  Continue to abstain from driving, climbing on high surfaces, swimming, or bathing unsupervised until you have been cleared by your neurologist to do so.  Having a seizure while completing 1 of these activities could be a risk to your life and labs others.  Return to the ED with new or worsening symptoms including intractable seizures or recurrent seizures.

## 2021-09-11 ENCOUNTER — Ambulatory Visit: Admit: 2021-09-11 | Discharge: 2021-09-12 | Payer: MEDICAID

## 2021-09-12 ENCOUNTER — Ambulatory Visit: Admit: 2021-09-12 | Discharge: 2021-09-13 | Payer: MEDICAID

## 2021-09-21 ENCOUNTER — Emergency Department (HOSPITAL_COMMUNITY)
Admission: EM | Admit: 2021-09-21 | Discharge: 2021-09-22 | Disposition: A | Discharge status: Home / Self Care | Payer: MEDICAID | Attending: Emergency Medicine | Admitting: Emergency Medicine

## 2021-09-21 ENCOUNTER — Other Ambulatory Visit: Payer: Self-pay

## 2021-09-21 ENCOUNTER — Encounter (HOSPITAL_COMMUNITY): Payer: Self-pay

## 2021-09-21 DIAGNOSIS — Z8616 Personal history of COVID-19: Secondary | ICD-10-CM | POA: Diagnosis not present

## 2021-09-21 DIAGNOSIS — E039 Hypothyroidism, unspecified: Secondary | ICD-10-CM | POA: Diagnosis not present

## 2021-09-21 DIAGNOSIS — Z20822 Contact with and (suspected) exposure to covid-19: Secondary | ICD-10-CM | POA: Diagnosis not present

## 2021-09-21 DIAGNOSIS — Z79899 Other long term (current) drug therapy: Secondary | ICD-10-CM | POA: Diagnosis not present

## 2021-09-21 DIAGNOSIS — F209 Schizophrenia, unspecified: Secondary | ICD-10-CM | POA: Diagnosis not present

## 2021-09-21 DIAGNOSIS — R45851 Suicidal ideations: Secondary | ICD-10-CM | POA: Diagnosis not present

## 2021-09-21 DIAGNOSIS — R440 Auditory hallucinations: Secondary | ICD-10-CM | POA: Diagnosis present

## 2021-09-21 DIAGNOSIS — F17210 Nicotine dependence, cigarettes, uncomplicated: Secondary | ICD-10-CM | POA: Diagnosis not present

## 2021-09-21 DIAGNOSIS — F23 Brief psychotic disorder: Secondary | ICD-10-CM | POA: Clinically undetermined

## 2021-09-21 MED ORDER — LEVOTHYROXINE SODIUM 50 MCG PO TABS
50.0000 ug | ORAL_TABLET | Freq: Every day | ORAL | Status: DC
  Administered 2021-09-22: 50 ug via ORAL
  Filled 2021-09-21: qty 1

## 2021-09-21 MED ORDER — ACETAMINOPHEN 325 MG PO TABS
650.0000 mg | ORAL_TABLET | Freq: Once | ORAL | Status: DC
  Filled 2021-09-21: qty 2

## 2021-09-21 MED ORDER — TRAZODONE HCL 100 MG PO TABS
200.0000 mg | ORAL_TABLET | Freq: Every day | ORAL | Status: DC
  Filled 2021-09-21: qty 2

## 2021-09-21 MED ORDER — LAMOTRIGINE 100 MG PO TABS
200.0000 mg | ORAL_TABLET | Freq: Every day | ORAL | Status: DC
  Filled 2021-09-21: qty 2

## 2021-09-21 MED ORDER — ONDANSETRON 4 MG PO TBDP
4.0000 mg | ORAL_TABLET | Freq: Once | ORAL | Status: DC
  Filled 2021-09-21: qty 1

## 2021-09-21 MED ORDER — DIVALPROEX SODIUM 500 MG PO DR TAB
1000.0000 mg | DELAYED_RELEASE_TABLET | Freq: Two times a day (BID) | ORAL | Status: DC
  Administered 2021-09-22: 1000 mg via ORAL
  Filled 2021-09-21 (×2): qty 2

## 2021-09-21 MED ORDER — PROPRANOLOL HCL ER 60 MG PO CP24: 60.0000 mg | ORAL_CAPSULE | Freq: Every day | ORAL | Status: DC

## 2021-09-21 MED ORDER — LAMOTRIGINE 100 MG PO TABS
100.0000 mg | ORAL_TABLET | Freq: Every morning | ORAL | Status: DC
  Administered 2021-09-22: 100 mg via ORAL
  Filled 2021-09-21: qty 1

## 2021-09-21 MED ORDER — RISPERIDONE 2 MG PO TABS
2.0000 mg | ORAL_TABLET | Freq: Two times a day (BID) | ORAL | Status: DC
  Administered 2021-09-22: 2 mg via ORAL
  Filled 2021-09-21 (×2): qty 1
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
## 2021-09-22 DIAGNOSIS — F23 Brief psychotic disorder: Secondary | ICD-10-CM | POA: Clinically undetermined

## 2021-09-22 DIAGNOSIS — F209 Schizophrenia, unspecified: Secondary | ICD-10-CM

## 2021-09-22 LAB — URINALYSIS, ROUTINE W REFLEX MICROSCOPIC
Bilirubin Urine: NEGATIVE
Glucose, UA: NEGATIVE mg/dL
Hgb urine dipstick: NEGATIVE
Ketones, ur: 5 mg/dL — AB
Leukocytes,Ua: NEGATIVE
Nitrite: NEGATIVE
Protein, ur: NEGATIVE mg/dL
Specific Gravity, Urine: 1.016 (ref 1.005–1.030)
pH: 7 (ref 5.0–8.0)

## 2021-09-22 LAB — COMPREHENSIVE METABOLIC PANEL
ALT: 11 U/L (ref 0–44)
AST: 18 U/L (ref 15–41)
Albumin: 3.9 g/dL (ref 3.5–5.0)
Alkaline Phosphatase: 42 U/L (ref 38–126)
Anion gap: 8 (ref 5–15)
BUN: 8 mg/dL (ref 6–20)
CO2: 23 mmol/L (ref 22–32)
Calcium: 9.1 mg/dL (ref 8.9–10.3)
Chloride: 103 mmol/L (ref 98–111)
Creatinine, Ser: 0.77 mg/dL (ref 0.61–1.24)
GFR, Estimated: >60 mL/min (ref >60)
Glucose, Bld: 105 mg/dL — ABNORMAL HIGH (ref 70–99)
Potassium: 4 mmol/L (ref 3.5–5.1)
Sodium: 134 mmol/L — ABNORMAL LOW (ref 135–145)
Total Bilirubin: 0.5 mg/dL (ref 0.3–1.2)
Total Protein: 8.3 g/dL — ABNORMAL HIGH (ref 6.5–8.1)

## 2021-09-22 LAB — CBC WITH DIFFERENTIAL/PLATELET
Abs Immature Granulocytes: 0.01 10*3/uL (ref 0.00–0.07)
Basophils Absolute: 0 10*3/uL (ref 0.0–0.1)
Basophils Relative: 0 %
Eosinophils Absolute: 0.1 10*3/uL (ref 0.0–0.5)
Eosinophils Relative: 1 %
HCT: 41 % (ref 39.0–52.0)
Hemoglobin: 14.3 g/dL (ref 13.0–17.0)
Immature Granulocytes: 0 %
Lymphocytes Relative: 41 %
Lymphs Abs: 1.8 10*3/uL (ref 0.7–4.0)
MCH: 31.3 pg (ref 26.0–34.0)
MCHC: 34.9 g/dL (ref 30.0–36.0)
MCV: 89.7 fL (ref 80.0–100.0)
Monocytes Absolute: 0.6 10*3/uL (ref 0.1–1.0)
Monocytes Relative: 15 %
Neutro Abs: 1.8 10*3/uL (ref 1.7–7.7)
Neutrophils Relative %: 43 %
Platelets: 200 10*3/uL (ref 150–400)
RBC: 4.57 MIL/uL (ref 4.22–5.81)
RDW: 13.1 % (ref 11.5–15.5)
WBC: 4.2 10*3/uL (ref 4.0–10.5)
nRBC: 0 % (ref 0.0–0.2)

## 2021-09-22 LAB — RAPID URINE DRUG SCREEN, HOSP PERFORMED
Amphetamines: NOT DETECTED
Barbiturates: NOT DETECTED
Benzodiazepines: NOT DETECTED
Cocaine: NOT DETECTED
Opiates: NOT DETECTED
Tetrahydrocannabinol: POSITIVE — AB

## 2021-09-22 LAB — SALICYLATE LEVEL: Salicylate Lvl: <7 mg/dL — ABNORMAL LOW (ref 7.0–30.0)

## 2021-09-22 LAB — RESP PANEL BY RT-PCR (FLU A&B, COVID) ARPGX2
Influenza A by PCR: NEGATIVE
Influenza B by PCR: NEGATIVE
SARS Coronavirus 2 by RT PCR: NEGATIVE

## 2021-09-22 LAB — VALPROIC ACID LEVEL: Valproic Acid Lvl: 41 ug/mL — ABNORMAL LOW (ref 50.0–100.0)

## 2021-09-22 LAB — TSH: TSH: 4.986 u[IU]/mL — ABNORMAL HIGH (ref 0.350–4.500)

## 2021-09-22 MED ORDER — DIVALPROEX SODIUM ER 500 MG PO TB24: 500.0000 mg | ORAL_TABLET | Freq: Every day | ORAL | Status: DC

## 2021-09-22 MED ORDER — RISPERIDONE 2 MG PO TABS: 3.0000 mg | ORAL_TABLET | Freq: Two times a day (BID) | ORAL | Status: DC

## 2021-09-22 MED ORDER — DIVALPROEX SODIUM ER 250 MG PO TB24: 250.0000 mg | ORAL_TABLET | Freq: Every day | ORAL | Status: DC

## 2021-09-22 NOTE — ED Notes (Signed)
Multiple attempts for medication administration. Pt is sleeping and refuses to get up to take medications.

## 2021-09-22 NOTE — ED Provider Notes (Signed)
I assumed care of this patient.  Please see previous provider note for further details of Hx, PE.    Medically cleared. [facility] to eval and dispo.      Nira Conn, MD 09/22/21 [phone]
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
## 2021-09-22 NOTE — ED Notes (Signed)
Patient walked out. Charge nurse notified.
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
## 2021-09-22 NOTE — ED Notes (Signed)
Patient called 2x and did not answer. Charge nurse and MD notified.

## 2021-09-27 DIAGNOSIS — D708 Other neutropenia: Principal | ICD-10-CM

## 2021-09-27 DIAGNOSIS — G0481 Other encephalitis and encephalomyelitis: Principal | ICD-10-CM

## 2021-09-27 DIAGNOSIS — D72818 Other decreased white blood cell count: Principal | ICD-10-CM

## 2021-09-27 MED ORDER — XCOPRI 150 MG TABLET
ORAL_TABLET | 5 refills | 0 days | Status: CP
Start: 2021-09-27 — End: ?

## 2021-10-09 ENCOUNTER — Ambulatory Visit: Admit: 2021-10-09 | Discharge: 2021-10-10 | Payer: PRIVATE HEALTH INSURANCE

## 2021-10-10 ENCOUNTER — Ambulatory Visit: Admit: 2021-10-10 | Discharge: 2021-10-11 | Payer: PRIVATE HEALTH INSURANCE

## 2021-10-31 DIAGNOSIS — D708 Other neutropenia: Principal | ICD-10-CM

## 2021-10-31 DIAGNOSIS — D72818 Other decreased white blood cell count: Principal | ICD-10-CM

## 2021-10-31 DIAGNOSIS — G0481 Other encephalitis and encephalomyelitis: Principal | ICD-10-CM

## 2021-11-06 ENCOUNTER — Ambulatory Visit: Admit: 2021-11-06 | Discharge: 2021-11-07 | Payer: PRIVATE HEALTH INSURANCE

## 2021-11-07 ENCOUNTER — Ambulatory Visit: Admit: 2021-11-07 | Discharge: 2021-11-08 | Payer: PRIVATE HEALTH INSURANCE

## 2021-11-14 ENCOUNTER — Ambulatory Visit: Admit: 2021-11-14 | Discharge: 2021-11-14 | Payer: PRIVATE HEALTH INSURANCE

## 2021-11-14 ENCOUNTER — Ambulatory Visit
Admit: 2021-11-14 | Discharge: 2021-11-14 | Payer: PRIVATE HEALTH INSURANCE | Attending: Student in an Organized Health Care Education/Training Program | Primary: Student in an Organized Health Care Education/Training Program

## 2021-11-14 DIAGNOSIS — R569 Unspecified convulsions: Principal | ICD-10-CM

## 2021-11-28 DIAGNOSIS — G40209 Localization-related (focal) (partial) symptomatic epilepsy and epileptic syndromes with complex partial seizures, not intractable, without status epilepticus: Principal | ICD-10-CM

## 2021-11-28 MED ORDER — LAMOTRIGINE 100 MG TABLET
ORAL_TABLET | 11 refills | 0 days | Status: CP
Start: 2021-11-28 — End: ?

## 2021-11-28 MED ORDER — CENOBAMATE 200 MG TABLET
ORAL_TABLET | Freq: Every day | ORAL | 3 refills | 0 days | Status: CP
Start: 2021-11-28 — End: ?

## 2021-12-04 ENCOUNTER — Ambulatory Visit: Admit: 2021-12-04 | Discharge: 2021-12-05 | Payer: PRIVATE HEALTH INSURANCE

## 2021-12-05 ENCOUNTER — Ambulatory Visit: Admit: 2021-12-05 | Discharge: 2021-12-06 | Payer: PRIVATE HEALTH INSURANCE

## 2021-12-27 DIAGNOSIS — G0481 Other encephalitis and encephalomyelitis: Principal | ICD-10-CM

## 2021-12-27 DIAGNOSIS — G40219 Localization-related (focal) (partial) symptomatic epilepsy and epileptic syndromes with complex partial seizures, intractable, without status epilepticus: Principal | ICD-10-CM

## 2021-12-27 MED ORDER — NAYZILAM 5 MG/SPRAY (0.1 ML) NASAL SPRAY
5 refills | 0 days | Status: CP
Start: 2021-12-27 — End: ?

## 2021-12-30 DIAGNOSIS — G0481 Other encephalitis and encephalomyelitis: Principal | ICD-10-CM

## 2021-12-30 DIAGNOSIS — D708 Other neutropenia: Principal | ICD-10-CM

## 2021-12-30 DIAGNOSIS — D72818 Other decreased white blood cell count: Principal | ICD-10-CM

## 2022-01-02 ENCOUNTER — Ambulatory Visit: Admit: 2022-01-02 | Discharge: 2022-01-03 | Payer: PRIVATE HEALTH INSURANCE

## 2022-01-03 ENCOUNTER — Ambulatory Visit: Admit: 2022-01-03 | Discharge: 2022-01-04 | Payer: PRIVATE HEALTH INSURANCE

## 2022-01-09 ENCOUNTER — Ambulatory Visit
Admit: 2022-01-09 | Discharge: 2022-01-10 | Payer: PRIVATE HEALTH INSURANCE | Attending: Student in an Organized Health Care Education/Training Program | Primary: Student in an Organized Health Care Education/Training Program

## 2022-01-09 DIAGNOSIS — G0481 Other encephalitis and encephalomyelitis: Principal | ICD-10-CM

## 2022-01-09 DIAGNOSIS — G40119 Localization-related (focal) (partial) symptomatic epilepsy and epileptic syndromes with simple partial seizures, intractable, without status epilepticus: Principal | ICD-10-CM

## 2022-01-09 DIAGNOSIS — F32A Depression, unspecified depression type: Principal | ICD-10-CM

## 2022-01-09 MED ORDER — CLOBAZAM 10 MG TABLET
ORAL_TABLET | 3 refills | 0 days | Status: CP
Start: 2022-01-09 — End: ?

## 2022-01-25 DIAGNOSIS — D72818 Other decreased white blood cell count: Principal | ICD-10-CM

## 2022-01-25 DIAGNOSIS — D708 Other neutropenia: Principal | ICD-10-CM

## 2022-01-25 DIAGNOSIS — G0481 Other encephalitis and encephalomyelitis: Principal | ICD-10-CM

## 2022-01-29 ENCOUNTER — Ambulatory Visit: Admit: 2022-01-29 | Discharge: 2022-01-30 | Payer: PRIVATE HEALTH INSURANCE

## 2022-01-30 ENCOUNTER — Ambulatory Visit: Admit: 2022-01-30 | Discharge: 2022-01-31 | Payer: PRIVATE HEALTH INSURANCE

## 2022-02-06 ENCOUNTER — Ambulatory Visit
Admit: 2022-02-06 | Discharge: 2022-02-09 | Disposition: A | Discharge status: Home / Self Care | Payer: PRIVATE HEALTH INSURANCE

## 2022-02-09 DIAGNOSIS — G40409 Other generalized epilepsy and epileptic syndromes, not intractable, without status epilepticus: Principal | ICD-10-CM

## 2022-02-09 DIAGNOSIS — G40209 Localization-related (focal) (partial) symptomatic epilepsy and epileptic syndromes with complex partial seizures, not intractable, without status epilepticus: Principal | ICD-10-CM

## 2022-02-09 MED ORDER — CLONAZEPAM 1 MG TABLET
ORAL_TABLET | ORAL | 0 refills | 6.00000 days | Status: CP
Start: 2022-02-09 — End: 2022-02-15
  Filled 2022-02-09: qty 9, 6d supply, fill #0

## 2022-02-09 MED ORDER — TRIAMCINOLONE ACETONIDE 0.1 % TOPICAL OINTMENT
Freq: Two times a day (BID) | TOPICAL | 0 refills | 0 days | Status: CP
Start: 2022-02-09 — End: 2023-02-09

## 2022-02-09 MED ORDER — LORAZEPAM 1 MG TABLET
ORAL_TABLET | ORAL | 0 refills | 0.00000 days | Status: CP
Start: 2022-02-09 — End: ?
  Filled 2022-02-09: qty 30, 15d supply, fill #0

## 2022-02-16 MED ORDER — ENSURE ORAL LIQUID
Freq: Three times a day (TID) | ORAL | 5 refills | 0 days | Status: CP
Start: 2022-02-16 — End: 2022-08-22

## 2022-02-16 MED ORDER — CYPROHEPTADINE 4 MG TABLET
ORAL_TABLET | Freq: Three times a day (TID) | ORAL | 5 refills | 30 days | Status: CP
Start: 2022-02-16 — End: 2022-08-22

## 2022-02-26 ENCOUNTER — Ambulatory Visit: Admit: 2022-02-26 | Discharge: 2022-02-27 | Payer: PRIVATE HEALTH INSURANCE

## 2022-02-26 DIAGNOSIS — G0481 Other encephalitis and encephalomyelitis: Principal | ICD-10-CM

## 2022-02-26 DIAGNOSIS — D708 Other neutropenia: Principal | ICD-10-CM

## 2022-02-26 DIAGNOSIS — D72818 Other decreased white blood cell count: Principal | ICD-10-CM

## 2022-02-27 ENCOUNTER — Ambulatory Visit: Admit: 2022-02-27 | Discharge: 2022-02-28 | Payer: PRIVATE HEALTH INSURANCE

## 2022-02-27 DIAGNOSIS — D708 Other neutropenia: Principal | ICD-10-CM

## 2022-02-27 DIAGNOSIS — G0481 Other encephalitis and encephalomyelitis: Principal | ICD-10-CM

## 2022-02-27 DIAGNOSIS — D72818 Other decreased white blood cell count: Principal | ICD-10-CM

## 2022-03-13 ENCOUNTER — Ambulatory Visit: Admit: 2022-03-13 | Payer: PRIVATE HEALTH INSURANCE

## 2022-03-13 ENCOUNTER — Ambulatory Visit
Admit: 2022-03-13 | Discharge: 2022-04-13 | Disposition: A | Discharge status: Home / Self Care | Payer: PRIVATE HEALTH INSURANCE | Admitting: Neurology
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
## 2022-03-28 NOTE — Unmapped (Addendum)
DAILY REAL TIME INTERIM LONGTERM VIDEO EEG MONITORING NOTE    Identifying Information   [name]    MRN: [id number]   DOB: 01-May-1991    LOC: [id number]      EEG Start: 03/28/22 at 02:15  EEG End: TBD    HISTORY: 31 y.o. 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  The study was recorded digitally with a bandpass of 1-70Hz  and a sampling rate of 200 Hz and was reviewed with the possibility of multiple reformatting.  The study was digitally processed with potential spike and seizure events identified for physician analysis and review.  Patient recognized events were identified by a push button marker and reviewed by the physician.   Simultaneous video was reviewed for all patient events.      DAY 1 EEG DESCRIPTION:  03/28/22  at 02:15 to 03/28/22 at 07:00   Relevant Medications:  ???  cenobamate 400 mg daily, Lamictal 200 mg BID, Valproic acid 500 mg AM and 1000 mg PM  ???  dexmedetomidine 400 mcg in sodium chloride 0.9% 100 ml (4 mcg/mL) infusion PMB, 0-1.5 mcg/kg/hr, Intravenous, Continuous,   ???  haloperidol lactate (HALDOL) injection 2 mg, 2 mg, Intramuscular, BID PRN, 2 mg at 03/27/22 1453 **AND** diphenhydramine (BENADRYL) injection, 12.5 mg, Intramuscular, BID PRN,   ???  Lorazepam (ATIVAN) injection 1 mg, 1 mg, Intramuscular, BID PRN,    Initial Background Activity  Continuity:  Continuous   Amplitude: Normal (20 - 150 uV)  Reactivity: Present   Frequency : Predominantly theta with superimposed lowe voltage alpha and beta   Posterior Dominant Rhythm:  Fragments of 7 Hz activity are seen posteriorly  Sleep Activity/ State Change: State change present with normal N2 sleep  spindles and vertex waves)    Focal slowing: Yes  ?? Higher voltage theta slowing was seen intermittently and independently over the right and left,maximal temporal regions, maximal right temporal.      ?? At times seen in brief bursts and sharply contoured about 1-2.5 seconds, BIRDS    Epileptiform Activity: Yes  ??? Location: Independently over the right and left temporal regions.  ??? Frequency Occasional: ?1/hour but less than 1/minute to rare    Seizures: No.  Patient Events: No.    DAY 2 EEG DESCRIPTION: 03/28/22 at 07:00 to 03/28/2022 at 19:00   Relevant Medications:  ???  cenobamate 400 mg daily, Lamictal 200 mg BID, Valproic acid 500 mg AM and 1000 mg PM and PRN Ativan 1 mg  ???  haloperidol lactate (HALDOL) injection 2 mg, 2 mg, Intramuscular, BID PRN, 2 mg at 03/27/22 1453 **AND** diphenhydramine (BENADRYL) injection, 12.5 mg, Intramuscular, BID PRN,     The EEG continued to show findings similar to the previous day???s recording including BRD in the left and right temporal regions seen very rarely.   Of note was significant eye movement artifact (from nystagmus) without seizure pattern.     Seizures: No.  Patient Events: No.    SUMMARY: Abnormal EEG due to:  ?? Diffuse background slowing   ?? Slow activity seen independently over the right and left temporal regions, maximal right   ?? Occasional to rare sharp waves seen over the right and left temporal regions, later BRD also seen  but very rarely      CLINICAL INTERPRETATION    The diffuse slowing in this record is a non-specific indicator of diffuse cerebral dysfunction as seen in delirium, metabolic derangement, toxicity, or other types of diffuse encephalopathy.  Focal slowing indicates cerebral dysfunction from a wide variety of potential etiologies in the region affected; it is a non-specific indicator of a structural or functional abnormality.   Focal epileptiform potentials can be seen in patients with partial-onset seizures, but do not in themselves confirm a diagnosis of seizures or epilepsy.      Interpreting Attending: Melba Coon, MD     2 HELPS 2 B Seizure Risk Score  Clinical risk score based on EEG findings and clinical history of seizures to aid in determination of optimal duration of EEG monitoring for detection of electrographic seizures.  Score has not been validated in patients under age 60 or following cardiac arrest.    Sporadic Epileptiform Discharges - 1 point and Brief Ictal Appearing Rhythmic Discharges (BIRDs) - 2 points    Add 1 point if known history of epilepsy or prior clinical seizure.      Risk Group     Seizure Risk   at 72 hours   Duration of Monitoring    Seizure risk < 5%     Duration of Monitoring    Seizure risk < 2%       Low Risk,   Score = 0     3.1%   1 Hour   3.3 Hours     Medium Risk,   Score = 1     12%   12 Hours   29 Hours     High Risk,   Score = 2 or greater                >25%   >24 Hours   >30 Hours   Struck et al JAMA Neurology, January 2020    Not appropriate for EEG monitoring being performed for the following:  Treatment of status epilepticus or seizures already documented on EEG  Monitoring sedation/ burst suppression for management of intracranial pressure and/or paralyzed patients  Diagnostic evaluation of transient episodes concerning for possible seizures (spell capture)   Patients s/p cardiac arrest undergoing targeted temperature management      Anthoney Harada al. American Clinical Neurophysiology Society's Standardized Critical Care EEG Terminology: 2021 Version. Journal of Clinical Neurophysiology 38(1):p 1-29, January 2021.   ACNS STANDARDIZED ICU EEG NOMENCLATURE v ([email or web address])
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
## 2022-03-29 DIAGNOSIS — G40909 Epilepsy, unspecified, not intractable, without status epilepticus: Principal | ICD-10-CM

## 2022-03-29 LAB — CBC
HEMATOCRIT: 37 % — ABNORMAL LOW (ref 39.0–48.0)
HEMOGLOBIN: 12.8 g/dL — ABNORMAL LOW (ref 12.9–16.5)
MEAN CORPUSCULAR HEMOGLOBIN CONC: 34.6 g/dL (ref 32.0–36.0)
MEAN CORPUSCULAR HEMOGLOBIN: 31.9 pg (ref 25.9–32.4)
MEAN CORPUSCULAR VOLUME: 92.1 fL (ref 77.6–95.7)
MEAN PLATELET VOLUME: 7.8 fL (ref 6.8–10.7)
PLATELET COUNT: 192 10*9/L (ref 150–450)
RED BLOOD CELL COUNT: 4.02 10*12/L — ABNORMAL LOW (ref 4.26–5.60)
RED CELL DISTRIBUTION WIDTH: 13.5 % (ref 12.2–15.2)
WBC ADJUSTED: 4.9 10*9/L (ref 3.6–11.2)

## 2022-03-29 LAB — BASIC METABOLIC PANEL
ANION GAP: 6 mmol/L (ref 5–14)
BLOOD UREA NITROGEN: <5 mg/dL — ABNORMAL LOW (ref 9–23)
CALCIUM: 9 mg/dL (ref 8.7–10.4)
CHLORIDE: 108 mmol/L — ABNORMAL HIGH (ref 98–107)
CO2: 26 mmol/L (ref 20.0–31.0)
CREATININE: 0.66 mg/dL
EGFR CKD-EPI (2021) MALE: >90 mL/min/{1.73_m2} (ref >=60)
GLUCOSE RANDOM: 80 mg/dL (ref 70–99)
POTASSIUM: 3.9 mmol/L (ref 3.4–4.8)
SODIUM: 140 mmol/L (ref 135–145)

## 2022-03-29 LAB — MAGNESIUM: MAGNESIUM: 1.5 mg/dL — ABNORMAL LOW (ref 1.6–2.6)

## 2022-03-29 LAB — PHOSPHORUS: PHOSPHORUS: 4.1 mg/dL (ref 2.4–5.1)

## 2022-03-29 MED ORDER — CENOBAMATE 200 MG TABLET
ORAL_TABLET | Freq: Every day | ORAL | 3 refills | 0.00000 days | Status: CP
Start: 2022-03-29 — End: ?
  Filled 2022-03-30: qty 60, 30d supply, fill #0

## 2022-03-29 MED ADMIN — propranoloL (INDERAL) tablet 30 mg: 30 mg | ORAL | @ 12:00:00

## 2022-03-29 MED ADMIN — acetaminophen (TYLENOL) tablet 650 mg: 650 mg | ORAL | @ 21:00:00

## 2022-03-29 MED ADMIN — diphenhydrAMINE (BENADRYL) injection: 12.5 mg | INTRAVENOUS | @ 21:00:00

## 2022-03-29 MED ADMIN — valproate (DEPACON) 500 mg in sodium chloride (NS) 0.9 % 50 mL infusion: 500 mg | INTRAVENOUS | @ 12:00:00

## 2022-03-29 MED ADMIN — calcium chloride 100 mg/mL (10 %) syringe 1 g: 1 g | INTRAVENOUS | @ 19:00:00 | Stop: 2022-03-29

## 2022-03-29 MED ADMIN — multivitamins, therapeutic with minerals tablet 1 tablet: 1 | ORAL | @ 12:00:00

## 2022-03-29 MED ADMIN — haloperidol lactate (HALDOL) injection 2 mg: 2 mg | INTRAVENOUS | @ 21:00:00

## 2022-03-29 MED ADMIN — diphenhydrAMINE (BENADRYL) injection: 12.5 mg | INTRAVENOUS | @ 18:00:00 | Stop: 2022-03-29

## 2022-03-29 MED ADMIN — thiamine mononitrate (vit B1) tablet 100 mg: 100 mg | ORAL | @ 12:00:00

## 2022-03-29 MED ADMIN — potassium chloride ER tablet 20 mEq: 20 meq | ORAL | @ 12:00:00 | Stop: 2022-03-29

## 2022-03-29 MED ADMIN — LORazepam (ATIVAN) injection 1 mg: 1 mg | INTRAVENOUS | @ 12:00:00

## 2022-03-29 MED ADMIN — acetaminophen (TYLENOL) tablet 650 mg: 650 mg | ORAL | @ 08:00:00

## 2022-03-29 MED ADMIN — lamoTRIgine (LaMICtal) tablet 200 mg: 200 mg | ORAL | @ 21:00:00

## 2022-03-29 MED ADMIN — LORazepam (ATIVAN) injection 1 mg: 1 mg | INTRAVENOUS | @ 18:00:00 | Stop: 2022-03-29

## 2022-03-29 MED ADMIN — magnesium sulfate 2gm/50mL IVPB: 2 g | INTRAVENOUS | @ 12:00:00 | Stop: 2022-03-29

## 2022-03-29 MED ADMIN — heparin, porcine (PF) 100 unit/mL injection 200 Units: 200 [IU] | INTRAVENOUS | @ 20:00:00 | Stop: 2022-03-29

## 2022-03-29 MED ADMIN — albumin human 5 % 5 % bottle 3,500 mL: 3500 mL | INTRAVENOUS | @ 19:00:00 | Stop: 2022-03-29

## 2022-03-29 MED ADMIN — valproate (DEPACON) 1,000 mg in sodium chloride (NS) 0.9 % 100 mL IVPB: 1000 mg | INTRAVENOUS | @ 21:00:00

## 2022-03-29 MED ADMIN — diphenhydrAMINE (BENADRYL) injection: 12.5 mg | INTRAVENOUS | @ 13:00:00

## 2022-03-29 MED ADMIN — propranoloL (INDERAL) tablet 30 mg: 30 mg | ORAL | @ 21:00:00

## 2022-03-29 MED ADMIN — LORazepam (ATIVAN) injection 1 mg: 1 mg | INTRAVENOUS | @ 21:00:00

## 2022-03-29 MED ADMIN — folic acid (FOLVITE) tablet 1 mg: 1 mg | ORAL | @ 12:00:00

## 2022-03-29 MED ADMIN — haloperidol lactate (HALDOL) injection 2 mg: 2 mg | INTRAVENOUS | @ 12:00:00

## 2022-03-29 MED ADMIN — anticoagulant citrate dextrose solution A injection 750 mL: 750 mL | INTRAVENOUS | @ 19:00:00

## 2022-03-29 MED ADMIN — lamoTRIgine (LaMICtal) tablet 200 mg: 200 mg | ORAL | @ 12:00:00

## 2022-03-29 NOTE — Unmapped (Signed)
NEURO: qshift, Alert to sedated, Oriented x 4, Follows in all extremities, Pupils equal and reactive, Corneals, cough intact, Gag deferred, Strong in all extremities, Nystagmus in both eye     Agitation & aggression - Calm and cooperative this shift. Drowsy and went to bed by 2200. Sitter at bedside at all times. Tough cuffs trial off with patient giving acceptance and understanding to not tamper or touch lines in exchange for for having off while sleeping. No issues so far overnight.      Off precedex  4pt restraints w/ sitter - See above.     CARDIAC: NSR to ST, Sys goal 90-180 - low MAPs seen during sleeping - mentation intact with check ins during these events.     HEME: Refusing SCDs. Lovenox given.    RESPIRATORY: Room air    GI/GU: Condom cath off, voiding. No BM during shift    RENAL/ENDO: None    INTEGUMENT: Intact    ID: Afebrile    LINES: Apharesis cath R internal jugular, 1 PIV RUE      Problem: Adult Inpatient Plan of Care  Goal: Plan of Care Review  Outcome: Progressing  Goal: Patient-Specific Goal (Individualized)  Outcome: Progressing  Goal: Absence of Hospital-Acquired Illness or Injury  Outcome: Progressing  Intervention: Identify and Manage Fall Risk  Recent Flowsheet Documentation  Taken 03/28/2022 2000 by Amedeo Plenty, RN  Safety Interventions:   aspiration precautions   elopement precautions   lighting adjusted for tasks/safety   low bed   sitter at bedside  Intervention: Prevent Skin Injury  Recent Flowsheet Documentation  Taken 03/28/2022 2000 by Amedeo Plenty, RN  Skin Protection: pulse oximeter probe site changed  Intervention: Prevent and Manage VTE (Venous Thromboembolism) Risk  Recent Flowsheet Documentation  Taken 03/29/2022 0200 by Amedeo Plenty, RN  Activity Management: bedrest  Taken 03/29/2022 0000 by Amedeo Plenty, RN  Activity Management: bedrest  Taken 03/28/2022 2200 by Amedeo Plenty, RN  Activity Management: bedrest  Taken 03/28/2022 2000 by Amedeo Plenty, RN  Activity Management: bedrest  Intervention: Prevent Infection  Recent Flowsheet Documentation  Taken 03/28/2022 2000 by Amedeo Plenty, RN  Infection Prevention:   cohorting utilized   single patient room provided  Goal: Optimal Comfort and Wellbeing  Outcome: Progressing  Goal: Readiness for Transition of Care  Outcome: Progressing  Goal: Rounds/Family Conference  Outcome: Progressing     Problem: Seizure Disorder Comorbidity  Goal: Maintenance of Seizure Control  Outcome: Progressing  Intervention: Maintain Seizure-Symptom Control  Recent Flowsheet Documentation  Taken 03/28/2022 2000 by Amedeo Plenty, RN  Seizure Precautions:   activity supervised   clutter-free environment maintained     Problem: Self-Care Deficit  Goal: Improved Ability to Complete Activities of Daily Living  Outcome: Progressing     Problem: Violence Risk or Actual  Goal: Anger and Impulse Control  Outcome: Progressing     Problem: VTE (Venous Thromboembolism)  Goal: VTE (Venous Thromboembolism) Symptom Resolution  Outcome: Progressing  Intervention: Prevent or Manage VTE (Venous Thromboembolism)  Recent Flowsheet Documentation  Taken 03/29/2022 0200 by Amedeo Plenty, RN  Anti-Embolism Device Type: SCD, Knee  Anti-Embolism Intervention: Refused  Anti-Embolism Device Location: BLE  Taken 03/29/2022 0000 by Amedeo Plenty, RN  Anti-Embolism Device Type: SCD, Knee  Anti-Embolism Intervention: Refused  Anti-Embolism Device Location: BLE  Taken 03/28/2022 2200 by Amedeo Plenty, RN  Anti-Embolism Device Type: SCD, Knee  Anti-Embolism Intervention: Refused  Anti-Embolism Device Location: BLE  Taken  03/28/2022 2000 by Amedeo Plenty, RN  Bleeding Precautions: blood pressure closely monitored  Anti-Embolism Device Type: SCD, Knee  Anti-Embolism Intervention: Refused  Anti-Embolism Device Location: BLE     Problem: Seizure, Active Management  Goal: Absence of Seizure/Seizure-Related Injury  Outcome: Progressing  Intervention: Prevent Seizure-Related Injury  Recent Flowsheet Documentation  Taken 03/28/2022 2000 by Amedeo Plenty, RN  Seizure Precautions:   activity supervised   clutter-free environment maintained     Problem: Adjustment to Illness (Delirium)  Goal: Optimal Coping  Outcome: Progressing     Problem: Altered Behavior (Delirium)  Goal: Improved Behavioral Control  Outcome: Progressing     Problem: Attention and Thought Clarity Impairment (Delirium)  Goal: Improved Attention and Thought Clarity  Outcome: Progressing     Problem: Sleep Disturbance (Delirium)  Goal: Improved Sleep  Outcome: Progressing     Problem: Fall Injury Risk  Goal: Absence of Fall and Fall-Related Injury  Outcome: Progressing  Intervention: Promote Injury-Free Environment  Recent Flowsheet Documentation  Taken 03/28/2022 2000 by Amedeo Plenty, RN  Safety Interventions:   aspiration precautions   elopement precautions   lighting adjusted for tasks/safety   low bed   sitter at bedside     Problem: Skin Injury Risk Increased  Goal: Skin Health and Integrity  Outcome: Progressing  Intervention: Optimize Skin Protection  Recent Flowsheet Documentation  Taken 03/29/2022 0200 by Amedeo Plenty, RN  Head of Bed [facility]) Positioning: HOB at 15 degrees  Taken 03/29/2022 0000 by Amedeo Plenty, RN  Pressure Reduction Techniques: frequent weight shift encouraged  Head of Bed [facility]) Positioning: HOB at 15 degrees  Taken 03/28/2022 2200 by Amedeo Plenty, RN  Pressure Reduction Techniques: frequent weight shift encouraged  Head of Bed [facility]) Positioning: HOB at 30 degrees  Taken 03/28/2022 2000 by Amedeo Plenty, RN  Pressure Reduction Techniques: frequent weight shift encouraged  Head of Bed (HOB) Positioning: HOB at 30-45 degrees  Pressure Reduction Devices: pressure-redistributing mattress utilized  Skin Protection: pulse oximeter probe site changed

## 2022-03-29 NOTE — Unmapped (Signed)
DAILY REAL TIME INTERIM LONGTERM VIDEO EEG MONITORING NOTE    Identifying Information   NAMEHavish Banks    MRN: [id number]   DOB: 1991/01/03    LOC: 2735/2735-01      EEG Start: 03/28/22 at 02:15  EEG End: TBD    HISTORY: 31 y.o. 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  The study was recorded digitally with a bandpass of 1-70 Hz and a sampling rate of 200 Hz and was reviewed with the possibility of multiple reformatting.  The study was digitally processed with potential spike and seizure events identified for physician analysis and review.  Patient recognized events were identified by a push button marker and reviewed by the physician.   Simultaneous video was reviewed for all patient events.      DAY 1 EEG DESCRIPTION:  03/28/22  at 02:15 to 03/28/22 at 07:00   Relevant Medications:  ???  cenobamate 400 mg daily, Lamictal 200 mg BID, Valproic acid 500 mg AM and 1000 mg PM  ???  dexmedetomidine 400 mcg in sodium chloride 0.9% 100 ml (4 mcg/mL) infusion PMB, 0-1.5 mcg/kg/hr, Intravenous, Continuous,   ???  haloperidol lactate (HALDOL) injection 2 mg, 2 mg, Intramuscular, BID PRN, 2 mg at 03/27/22 1453 **AND** diphenhydramine (BENADRYL) injection, 12.5 mg, Intramuscular, BID PRN,   ???  Lorazepam (ATIVAN) injection 1 mg, 1 mg, Intramuscular, BID PRN,    Initial Background Activity  Continuity:  Continuous   Amplitude: Normal (20 - 150 uV)  Reactivity: Present   Frequency : Predominantly theta with superimposed lowe voltage alpha and beta   Posterior Dominant Rhythm:  Fragments of 7 Hz activity are seen posteriorly  Sleep Activity/ State Change: State change present with normal N2 sleep  spindles and vertex waves)    Focal slowing: Yes  ?? Higher voltage theta slowing was seen intermittently and independently over the right and left,maximal temporal regions, maximal right temporal.      ?? At times seen in brief bursts and sharply contoured about 1-2.5 seconds, BIRDS    Epileptiform Activity: Yes  ??? Location: Independently over the right and left temporal regions.  ??? Frequency Occasional: ?1/hour but less than 1/minute to rare    Seizures: No.  Patient Events: No.    DAY 2 EEG DESCRIPTION: 03/28/22 at 07:00 to 03/29/2022 at 07:00   Relevant Medications:  ???  cenobamate 400 mg daily, Lamictal 200 mg BID, Valproic acid 500 mg AM and 1000 mg PM and PRN Ativan 1 mg  ???  haloperidol lactate (HALDOL) injection 2 mg, 2 mg, Intramuscular, BID PRN, 2 mg at 03/27/22 1453 **AND** diphenhydramine (BENADRYL) injection, 12.5 mg, Intramuscular, BID PRN,     The EEG continued to show findings similar to the previous day???s recording background.     Epileptiform activity: Present   ?? Over the right and left temporal regions, as sharp waves or bursts or trains of rhythmic theta ~6 Hz about 1-4 seconds long.  ?? Seen between 07:14 to 07:30 and 23:19 and 23:22, seen occasional ( ?1/hour but less than 1/minute)  to Frequent (?1/minute but less than 1 per 10 second)   ?? After 23:24, this activity is not seen.     Seizures: No.  Patient Events: No.Of  note was significant eye movement artifact (from nystagmus) without seizure pattern.     SUMMARY: Abnormal EEG due to:  ?? Diffuse background slowing   ?? Slow activity seen independently over the right and left temporal regions, maximal right   ?? Occasional to rare sharp waves seen over the right and left temporal regions, later BRD also seen but very rarely      CLINICAL INTERPRETATION    The diffuse slowing in this record is a non-specific indicator of diffuse cerebral dysfunction as seen in delirium, metabolic derangement, toxicity, or other types of diffuse encephalopathy.  Focal slowing indicates cerebral dysfunction from a wide variety of potential etiologies in the region affected; it is a non-specific indicator of a structural or functional abnormality.   Focal epileptiform potentials can be seen in patients with partial-onset seizures, but do not in themselves confirm a diagnosis of seizures or epilepsy.      Interpreting Attending: Melba Coon, MD     2 HELPS 2 B Seizure Risk Score  Clinical risk score based on EEG findings and clinical history of seizures to aid in determination of optimal duration of EEG monitoring for detection of electrographic seizures.  Score has not been validated in patients under age 5 or following cardiac arrest.    Sporadic Epileptiform Discharges - 1 point and Brief Ictal Appearing Rhythmic Discharges (BIRDs) - 2 points    Add 1 point if known history of epilepsy or prior clinical seizure.      Risk Group     Seizure Risk   at 72 hours   Duration of Monitoring    Seizure risk < 5%     Duration of Monitoring    Seizure risk < 2%       Low Risk,   Score = 0     3.1%   1 Hour   3.3 Hours     Medium Risk,   Score = 1     12%   12 Hours   29 Hours     High Risk,   Score = 2 or greater                >25%   >24 Hours   >30 Hours   Struck et al JAMA Neurology, January 2020    Not appropriate for EEG monitoring being performed for the following:  Treatment of status epilepticus or seizures already documented on EEG  Monitoring sedation/ burst suppression for management of intracranial pressure and/or paralyzed patients  Diagnostic evaluation of transient episodes concerning for possible seizures (spell capture)   Patients s/p cardiac arrest undergoing targeted temperature management      Anthoney Harada al. American Clinical Neurophysiology Society 's Standardized Critical Care EEG Terminology: 2021 Version. Journal of Clinical Neurophysiology 38(1):p 1-29, January 2021.   ACNS STANDARDIZED ICU EEG NOMENCLATURE v ([email or web address])

## 2022-03-29 NOTE — Unmapped (Signed)
Neurology Inpatient Team B (NMB)  Daily Progress Note       Patient: Austin Banks  Code Status: Full Code  Level of Care: ICU status.   LOS: 16 days      Overnight Events & Subjective:     - off precedex overnight  - MRI Brain done - R > L cerebellar atrophy disproportionate for age. No abnormal enhancement  - ENA/ANA in process  - cvEEG similar to prior - bilateral temporal sharps or rhythmic theta. None seen after 23:24 on 5/3. Significant eye movement artifact secondary to nystagmus. Ok to discontinue today. Patient pulling off EEG leads during rounds; threatening to pull out PLEx catheter  - per psych rec's, no med changes. Limit labs/vitals/interaction with staff as these are triggering to patient    - ICU attempting next PLEx session with PRN's, only      Physical Exam:     General Exam:   General Appearance: Sitting in chair talking to sitter.   HEENT: PLEx catheter in place  Lungs: Normal work of breathing.   Heart: Regular rate and rhythm per monitor  Abdomen: Deferred  Extremities: No clubbing or cyanosis.     Neurological Exam:   Mental Status:   Alert, calm. At baseline has mild dysarthria. Did not speak directly with patient today however on recent exam he is a bit slow in his verbal responses but responses are appropriate. States that he remembers everything that has happened to him during this hospital stay.       Cranial Nerves:   Nystagmus evident on EEG. Patient was not examined closely this morning but on observation he has mild L facial droop at rest.       Sensory exam:   Not done today. Previously light touch has been normal in the bilateral upper extremities and bilateral lower extremities.     Motor Exam:   Normal bulk   Antigravity x4 spontaneously. Able to pull against UE restraints with at least 4/5 strength.     Reflexes:   Deferred. Previously 1+ throughout bilaterally, Hoffmann's absent bilaterally, Toes down going bilaterally    Cerebellar/Coordination:  Not assessed     Gait:   Not assessed       Assessment/Plan:     Assessment: Austin Banks is a 31 y.o. 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      # Dysarthria - AMS - Imbalance - Difficulty Ambulating: Patient presented with 2 weeks of worsening balance and fluctuating mental status in the setting of recent medication changes, frequent marijuana use and falls associated with seizure and subsequent head injury. His symptoms are likely medication and drug related given the multiple CNS sedating meds he requires for seizure control with concurrent drug use. Other than the dysarthria which has improved and the multi-directional nystagmus, there are no other focal neurological findings on exam. With his history of medically refractory epilepsy and questionable compliance, cannot rule out subclinical events contributing to his fluctuating mental status as well, but EEG during admission has not revealed any seizures.    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 Given one dose on IV VPA 500mg  and placed on IV maintenance as we await NG tube placement  vs PO clearance. Patient endorses compliance with ASMs, however, he spends half the week with friends per father, partying and smoking marijuana and so compliance is questionable. He has had subtherapeutic levels of Lamictal in the past. Valproic acid was obtained on admission but collected after he received 500mg  IV.     Differential diagnosis: Increased seizure burden vs toxic encephalopathy 2/2 medication and drug use      Workup:  Resulted:  - CTH: No acute abnormalities, On personal review, patient has atrophy greater than expected for age cerebellum>cerebral cortex  - CT C/A/P: No abnormal lesions/masses  - CT cervical spine: No fracture or traumatic malalignment of the cervical spine.  - UA unremarkable  - VPA level 74.0  - Vitamin B12 wnl, RPR nonreactive  - Alcohol level (<10.0 4/19pm)  - Utox (+cannabinoids + benzos)  - cvEEG (03/14/22 0430 to 03/15/22 11:30 AM ): There were bilateral independent (L>>R) temporal epileptiform discharges indicating focal cortical irritation and increased potential for seizure activity over there regions. There was moderate encephalopathy which is non-specific in etiology. There are no seizures. EEG discontinued on 4/20; restarted on 5/2  - HIV negative  - lamotrigine level 6.8 (4/20)  - clobazam level 50 (4/19)  - B1 level 113 (4/19)  - GAD 65 antibody elevated 163 (03/14/22)   - previously 40.4 in June 2021  - MRI brain with and without contrast done 5/3: R > L cerebellar atrophy disproportionate for age. No abnormal enhancement    Plan:  - Continue home lamictal 200mg  BID  - Continue IV valproate to 500mg  qAM + 1000mg  nightly at 6pm for seizure and behavioral management   - Continue haldol 2mg  bid standing dose, benadryl 12.5 mg bid standing dose, lorazepam 1mg  bid standing dose   - Continue cenobamate 400mg  at bedtime   - Discontinued onfi due to oversedation  - Discontinued Fycompa 2 mg at bedtime (s4/20, dc 4/27) given literature reports of increased agitation  - Received rituximab 1g IV (4/24)  - plan for PLEx x5 inpatient (i5/2) followed by maintenance PLEx at 2 weeks then monthly thereafter  - discontinue EEG (bitemporal sharp waves and R>L temporal slowing)  - Repeat GAD 65 Ab 10 days after last PLEx session    # Dysarthria - Excessive Drooling - C/f Aspiration: Patient had excessive coughing with liquid coming out his nose during bedside swallow eval and thus failed. Possibly medication related as Onfi was initiated recently (02/17/22). Father also notes dysarthria after his marijuana use. CXR suggested mild left lower lobe consolidation c/f possible aspiration PNA but lungs clear on CT chest. NG tube placed with continuous tube feeds upon admission but patient able to tolerate regular diet since 4/21.  - Nutrition consulted; appreciate recs    # Weight Loss - Poor Appetite: Patients father reports ~ 30lb weight loss over the past 6 months. Weight was 87kg in 06/2021, 77.7kg this admission. With his know GAD 65 positivity and tobacco abuse, malignancy screening with CT chest, abdomen and pelvis was obtained and unremarkable. Poor appetite could also be medication and/or tobacco use related.  - Nutrition consulted; appreciate recs     # Psychosis - Schizophrenia: IVC currently in place. Behavioral response called for increasing combativeness in attempt to elope requiring IM Ativan 2mg , followed by Haldol. Patient seems emotionally labile-appearing calm amenable to admission on initial evaluation but after his father left the bedside he became agitated, loud, refusing admission and encroaching on this examiner's personal space. Patient has required multiple psychiatric  admissions in the past most recently held in our Psych holding unit in 04/2021 for behavioral problems.   -- Psychiatry consulted, appreciate recs  - IV Depakote 500mg  qAM + 1000mg  nightly (at 6pm) as above   - Depakote levels:   - 4/25 subtherapeutic 38.6   - 4/28 therapeutic at 68.1   - 4/29 subtherapeutic at 48.7              - 5/2 therapeutic at 55.3   - Plan to repeat depakote level on 5/5  - Continue haldol 2mg  BID standing dose (9AM, 6PM)  - Continue Benadryl 12.5 mg BID standing dose (9AM, 6PM)  - Continue lorazepam 1mg  BID standing dose (9AM, 6PM)  - Continue propranolol 30mg  BID in place of home propranolol XR 60mg  daily  - CONTINUE home lamictal 200 mg PO BID  - HOLD home trazodone 200 mg nightly  -- Agitation PRNs:   -- Continue Haldol 2mg  IV bid PRN   -- Continue Ativan 1mg  IV bid PRN   -- Continue Benadryl 12.5mg  IV bid PRN  -- continue thiamine PO/NGT supplementation daily   -- continue folate supplementation and multivitamin daily   -- continue telemetry monitoring    - EKG to monitor QTc 395 (4/23)     # Polysubstance Use: Seen in [facility]. He has been referred to smoking cessation program. Patient endorses daily tobacco use and marijuana use (when visiting friends weekly). Denies recent cocaine use. Utox (+cannabinoids + benzos).  - Nicotine patch daily     Discharge Planning:   - Case management: consulted. Recommendations appreciated.  - Social work: N/A  - PT: consulted, 5xL  - OT: consulted, 3x  - SLP: 3x  - Expected Discharge Disposition: TBD. Referral for [facility] placed. Expanded referrals statewide for inpatient psych admission.      Checklist:  - Diet: Regular diet  - IV fluids: none  - Bowel Regimen: senna  - GI PPX: No GI indications  - DVT PPX: Lovenox 40 mg [place] daily  - Lines/Access: PIV x1, apheresis catheter  - Foley: No     Patient discussed and seen with attending physician, Dr. Lanora Manis.     This patient is co-managed with the NSICU Service. Under this co-management agreement, the NSICU serves as the first-call team.??Please page the NSICU team at [phone] for any questions/concerns.  Neurology Team B resident can be reached at [phone].     Alwyn Pea, MD   Neurology, PGY-2     Data Review:       Contact Information:  Family contact: Srikar, Chiang (Father)   [phone] (Mobile)    PCP: [facility]    Medications:  Scheduled medications:   ??? cenobamate  2 tablet Oral At bedtime   ??? diphenhydrAMINE  12.5 mg Intravenous BID   ??? enoxaparin (LOVENOX) injection  40 mg Subcutaneous Q24H SCH   ??? folic acid  1 mg Oral Daily   ??? haloperidol lactate  2 mg Intravenous BID    And   ??? LORazepam  1 mg Intravenous BID   ??? lamoTRIgine  200 mg Oral BID   ??? multivitamins, therapeutic with minerals  1 tablet Oral Daily   ??? nicotine  1 patch Transdermal Daily   ??? propranoloL  30 mg Oral BID   ??? senna  2 tablet Oral Nightly   ??? thiamine mononitrate (vit B1)  100 mg Oral Daily   ??? valproate sodium  500 mg Intravenous Daily before breakfast  And   ??? valproate sodium  1,000 mg Intravenous Nightly     Continuous infusions:   ??? dexmedetomidine Stopped (03/28/22 1645)     PRN medications: acetaminophen, albuterol, anticoagulant citrate dextrose solution A, haloperidol lactate **AND** diphenhydrAMINE, heparin, porcine (PF), LORazepam    24 hour vital signs:  Temp:  [36.1 ??C (96.9 ??F)-36.8 ??C (98.3 ??F)] 36.8 ??C (98.2 ??F)  Heart Rate:  [55-99] 66  SpO2 Pulse:  [50-90] 71  Resp:  [10-22] 17  BP: (90-165)/(39-90) 90/52  MAP (mmHg):  [59-103] 65  SpO2:  [95 %-100 %] 98 %    Ins and Outs:  I/O this shift:  In: 1052.3 [P.O.:790; I.V.:70; IV Piggyback:192.3]  Out: 250 [Urine:250]    Laboratory values:  All Labs Last 24hrs:   Recent Results (from the past 24 hour(s))   Basic Metabolic Panel    Collection Time: 03/29/22  3:52 AM   Result Value Ref Range    Sodium 140 135 - 145 mmol/L    Potassium 3.9 3.4 - 4.8 mmol/L    Chloride 108 (H) 98 - 107 mmol/L    CO2 26.0 20.0 - 31.0 mmol/L    Anion Gap 6 5 - 14 mmol/L    BUN <5 (L) 9 - 23 mg/dL    Creatinine 1.61 0.96 - 1.10 mg/dL    eGFR CKD-EPI (0454) Male >90 >=60 mL/min/1.21m2    Glucose 80 70 - 99 mg/dL    Calcium 9.0 8.7 - 09.8 mg/dL   CBC    Collection Time: 03/29/22  3:52 AM   Result Value Ref Range    WBC 4.9 3.6 - 11.2 10*9/L    RBC 4.02 (L) 4.26 - 5.60 10*12/L    HGB 12.8 (L) 12.9 - 16.5 g/dL    HCT 11.9 (L) 14.7 - 48.0 %    MCV 92.1 77.6 - 95.7 fL    MCH 31.9 25.9 - 32.4 pg    MCHC 34.6 32.0 - 36.0 g/dL    RDW 82.9 56.2 - 13.0 %    MPV 7.8 6.8 - 10.7 fL    Platelet 192 150 - 450 10*9/L   Magnesium Level    Collection Time: 03/29/22  3:52 AM   Result Value Ref Range    Magnesium 1.5 (L) 1.6 - 2.6 mg/dL   Phosphorus Level    Collection Time: 03/29/22  3:52 AM   Result Value Ref Range    Phosphorus 4.1 2.4 - 5.1 mg/dL       Imaging:  Pertinent imaging discussed in the A/P section

## 2022-03-29 NOTE — Unmapped (Signed)
Austin Banks is here for TPE.   Accessed R side CVAD Red and Blue Lumens without difficulty; blood return observed.   Pt is AO x 2, a bedbound in restraints due to confusion and patient safety, sitter at bedside vital signs are stable.     Treatment completed without complications.     Post procedure VSS, NAD, sitter at side. Report to floor nurse.

## 2022-03-29 NOTE — Unmapped (Addendum)
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    Procedure #: 2/5    Labs:   HCT   Date Value Ref Range Status   03/29/2022 37.0 (L) 39.0 - 48.0 % Final   09/15/2014 38.4 (L) 41.0 - 53.0 % Final     Platelet   Date Value Ref Range Status   03/29/2022 192 150 - 450 10*9/L Final   09/15/2014 231 150 - 440 10*9/L Final     Premedications:  None    Procedure:  Following a timeout, the patient was accessed by the treating RN and good return blood flow was achieved.  A 1.0 plasma volume Therapeutic Plasma Exchange was performed.    Replacement Fluid: 5% albumin  Calcium replacement: None  Complications: None  Fluid balance: +5 mL      The procedure was well tolerated. Next procedure scheduled for Friday 03/30/22.    For more details about this procedure, see the patient's encounter for this procedure, and go to the All Flowsheet Templates section and see the [facility] Plasma Exchange Treatment Flowsheet.    Vedia Pereyra, D.O.  Department of Laboratory Medicine and Pathology  Resident Physician, PGY-1  Pager: [phone]    Transfusion Medicine Attending Attestation:  I was present/available throughout the entire procedure. I reviewed the Resident???s note. I agree with the Resident???s findings.  For questions regarding the procedure, contact the Transfusion Medicine resident on call.     Silas Sacramento, MD MS  Transfusion Medicine Service

## 2022-03-29 NOTE — Unmapped (Signed)
[facility] Specialty Medication Onboarding    Specialty Medication: Estate agent  Prior Authorization: Not Required   Financial Assistance: No - copay  <$25  Final Copay/Day Supply: $4 / 30    Insurance Restrictions: None     Notes to Pharmacist:     The triage team has completed the benefits investigation and has determined that the patient is able to fill this medication at [facility]. Please contact the patient to complete the onboarding or follow up with the prescribing physician as needed.

## 2022-03-29 NOTE — Unmapped (Signed)
Neurocritical Care Admission Note     CODE STATUS:    Code Status: Full Code    HCDM (patient stated preference): Cung, Masterson - Father - [phone]    HCDM (patient stated preference): Charan, Prieto - Mother - [phone]  I discussed and confirmed Code Status with patient or HCDM    Date of service: 03/29/2022  Hospital Day:  LOS: 16 days        HPI     Austin Banks is a 31 y.o. 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 The patient was admitted to the Neurology floor service for further workup. The patient was placed on EEG monitoring but was negative for seizures. The patient was also noted to have an increase in GAD 65 levels to 163 (from 40.4 in 2021). Given this increase, PLEX treatment was planned. However, since admission, the patient has been increasingly agitated and would not be compliant with PLEX treatment. The patient is now being admitted to the NSICU for possible conscious sedation for catheter placement and subsequent PLEX treatment.     Assessment and Plan     Austin Banks is a 31 y.o. male admitted to NSICU with a diagnosis of GAD 64 associated seizures and encephalopathy    24 hour events:  Continues to be borderline hypotensive but asymptomatic    Neuro:  Mobility goal: 2 (HOB elevation)    **Seizures  - known hx of seizures; recent increase in frequency  - seizure semiology (per neurology notes from previous admissions): Begins with right eye deviation, left facial clonic jerking, eye blinking, mumbling. LUE flexed with coordinated back and forth movements of his bilateral lower extremities and drool from the left side of his mouth. All movement abruptly ceases at ictal offset with pelvic thrusting. Family also describes hand automatisms after shaking at times. Post-ictal confusion lasting up to 20 minutes. - previous EEG monitoring from negative for seizures  - - 2HELPS2B Score components: Sporadic Epileptiform Discharges = 1, Brief Ictal Appearing Rhythmic Discharges (BIRDs) = 2  and Prior Clinical Seizures or Hx of Epilepsy = 1  Total 2HELPS2B Seizure Risk Score: 4  - Prelim EEG confers a high risk for seizures, plan to monitor 48 hours, ends today  - continue lamictal 200mg  BID and valproate 500mg  qAM + 1g nightly  - 5/2 valproate level 55.3    **Encephalopathy  - likely d/t elevated GAD 65   - GAD 65 elevated at 163 (from 40.4 in 2021)  - previously received monthly IVIG treatments  - currently adequately sedated on precedex infusion  - PLEX treatment (day 2/5 session, every other day) - pre-medicate with Benadryl and Lorazepam today.  If needed, may utilize prn haldol as rescue  - seizure management as above  - previously encephalopathy workup unremarkable    **Polysubstance abuse  - current marijuana and tobacco use, remote cocaine use  - continue thiamine, multivitamins, and folate  - monitor for signs of withdrawal    **Psychosis/Schizophrenia/Agitation  - increasingly agitated since admission to [facility]  - has required multiple psychiatric admissions in the past, most recently at [facility] Psych holding unit in 04/2021 for behavioral problems  - Psychiatry following, appreciate recs  - continue scheduled and PRN haldol 2mg  BID, benadryl 1mg  BID, and lorazepam  1mg  BID    **Pain  - precedex infusion for agitation, not required in absence of interventions  - Plan for another trial of PLEX in ICU initially without precedex and utilizing PRNs   - tylenol prn mild pain  - oxycodone prn moderate pain  - fentanyl prn severe pain      Cardiovascular:  Admission weight: 77.1kg  Daily Weight: 78.8 kg (173 lb 11.6 oz)     Hemodynamic goals:  - MAP > 65  - SBP < 180  - transition vital signs to Q4 hours unless on Precedex or during PLEX      Pulmonary    - stable on room air    **tobacco abuse  - every day smoker, 10 pack years  - place nicotine patch  - smoking cessation counseling      FEN  Body mass index is 23.56 kg/m??.    GI prophylaxis: not indicated  Last BM Date: 03/27/22  Foley catheter: No foley in place.        **Nutrition  - start PO diet    **Glycemic control  - none needed    **Fluids/electrolytes  - goal euvolemia  - follow strict ins/outs  - check admission chemistry panel and supplement electrolytes as needed      Heme/ID  Current DVT prophylaxis: SCDs, Lovenox 40 mg daily    - start chemical DVT prophylaxis given high risk    Labs on PLEX days    Current Access:         -  PIV x 2       - Arterial line: No arterial line present.       - Central venous line: Central line required for PLEX.    Tubes and drains:  Patient Lines/Drains/Airways Status       Active Active Lines, Drains, & Airways       Name Placement date Placement time Site Days    Apheresis Catheter 03/27/22 Non-tunneled 03/27/22  1427  Internal jugular  1    External Urinary Device 03/22/22 Without Suction 03/22/22  --  -- 7    Peripheral IV 03/26/22 Posterior;Right Forearm 03/26/22  1835  Forearm  2                           Objective Data     All vital signs and resulted laboratory studies for the past 24 hours have been reviewed.  All ordered medications have been reviewed.    Temp:  [36.1 ??C (96.9 ??F)-36.8 ??C (98.3 ??F)] 36.8 ??C (98.2 ??F)  Heart Rate:  [55-99] 66  SpO2 Pulse:  [50-90] 71  Resp:  [10-22] 17  BP: (90-165)/(39-90) 90/52  MAP (mmHg):  [59-103] 65  SpO2:  [95 %-100 %] 98 %    Intake/Output Summary (Last 24 hours) at 03/29/2022 0757  Last data filed at 03/29/2022 0600  Gross per 24 hour   Intake 1117.86 ml   Output 1650 ml   Net -532.14 ml          Physical Exam          General Exam:  General: Lying in bed. Appears comfortable. No obvious distress.   ENT:  Mucous membranes moist. Oropharynx clear.  Cardiovascular:  Regular rate and rhythm.  No murmurs. 2+ radial pulses bilaterally.    Respiratory: Breathing is comfortable and unlabored.  Lungs are clear to ausculation bilaterally.    Gastrointestinal: Soft, nontender, nondistended.  Extremities: Warm  and well-perfused. No cyanosis, clubbing, or edema.  Skin: No obvious rashes or ecchymoses.    Neurological Exam:  Mental Status  LOC: awake and alert  Orientation: States Bettina Gavia, year is [phone], and he is at the circus  Neglect: not present  Speech: normal  Language: UTA    Cranial Nerves  Pupils: Does not let you evaluate  Gaze: Resting nystagmus  Face Motor: normal and symmetric  Cough: strong    Motor:   RUE: spontaneous and 5/5  LUE: spontaneous and 5/5  RLE: spontaneous and 5/5  LLE: spontaneous and 5/5     Sensory:    Sensory: UTA    Glasgow Coma Score  Motor: localizing to pain = 5  Verbal: confused = 4  Eyes: eyes open spontaneously = 4        DISPOSITION     The patient requires admission to the [facility] for management of the above conditions.    Estimated Transfer Date:  5/4 (pending successful PLEX without sedation)        BILLING     This is a subsequent evaluation of the patient.    Terri Skains, MD  Neurocritical Care Attending

## 2022-03-29 NOTE — Unmapped (Signed)
FINAL EEG REPORT    NAME: Austin Banks    MRN: [id number]   DOB: Jun 10, 1991    LOC: 2735/2735-01      EEG Start: 03/28/22 at 02:15  EEG End: 03/29/2022 at 12:01    HISTORY: 31 y.o. 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  The study was recorded digitally with a bandpass of 1-70 Hz and a sampling rate of 200 Hz and was reviewed with the possibility of multiple reformatting.  The study was digitally processed with potential spike and seizure events identified for physician analysis and review.  Patient recognized events were identified by a push button marker and reviewed by the physician.   Simultaneous video was reviewed for all patient events.      DAY 1 EEG DESCRIPTION:  03/28/22  at 02:15 to 03/28/22 at 07:00   Relevant Medications:  ???  cenobamate 400 mg daily, Lamictal 200 mg BID, Valproic acid 500 mg AM and 1000 mg PM  ???  dexmedetomidine 400 mcg in sodium chloride 0.9% 100 ml (4 mcg/mL) infusion PMB, 0-1.5 mcg/kg/hr, Intravenous, Continuous,   ???  haloperidol lactate (HALDOL) injection 2 mg, 2 mg, Intramuscular, BID PRN, 2 mg at 03/27/22 1453 **AND** diphenhydramine (BENADRYL) injection, 12.5 mg, Intramuscular, BID PRN,   ???  Lorazepam (ATIVAN) injection 1 mg, 1 mg, Intramuscular, BID PRN,    Initial Background Activity  Continuity:  Continuous   Amplitude: Normal (20 - 150 uV)  Reactivity: Present   Frequency : Predominantly theta with superimposed lowe voltage alpha and beta   Posterior Dominant Rhythm:  Fragments of 7 Hz activity are seen posteriorly  Sleep Activity/ State Change: State change present with normal N2 sleep  spindles and vertex waves)    Focal slowing: Yes  ?? Higher voltage theta slowing was seen intermittently and independently over the right and left,maximal temporal regions, maximal right temporal.      ?? At times seen in brief bursts and sharply contoured about 1-2.5 seconds, BIRDS    Epileptiform Activity: Yes  ??? Location: Independently over the right and left temporal regions.  ??? Frequency Occasional: ?1/hour but less than 1/minute to rare    Seizures: No.  Patient Events: No.    DAY 2 EEG DESCRIPTION: 03/28/22 at 07:00 to 03/29/2022 at 07:00   Relevant Medications:  ???  cenobamate 400 mg daily, Lamictal 200 mg BID, Valproic acid 500 mg AM and 1000 mg PM and PRN Ativan 1 mg  ???  haloperidol lactate (HALDOL) injection 2 mg, 2 mg, Intramuscular, BID PRN, 2 mg at 03/27/22 1453 **AND** diphenhydramine (BENADRYL) injection, 12.5 mg, Intramuscular, BID PRN,     The EEG continued to show findings similar to the previous day???s recording background.     Epileptiform activity: Present   ?? Over the right and left temporal regions, as sharp waves or bursts or trains of rhythmic theta ~6 Hz about 1-4 seconds long.  ?? Seen between 07:14 to 07:30 and 23:19 and 23:22, seen occasional ( ?1/hour but less than 1/minute)  to Frequent (?1/minute but less than 1 per 10 second)   ?? After 23:24, this activity is not seen.     Seizures: No.  Patient Events: No.Of note was significant eye movement artifact (from nystagmus)  without seizure pattern.     The patient was disconnected from EEG at 12:01 on 03/29/2022. The remainder of the record was reviewed. The EEG continued to show findings similar to the previous day???s recording. There were no push button events.    SUMMARY: Abnormal EEG due to:  ?? Diffuse background slowing   ?? Slow activity seen independently over the right and left temporal regions, maximal right   ?? Occasional to rare sharp waves seen over the right and left temporal regions, later BRD were seen very rarely.      CLINICAL INTERPRETATION    The diffuse slowing in this record is a non-specific indicator of diffuse cerebral dysfunction as seen in delirium, metabolic derangement, toxicity, or other types of diffuse encephalopathy.  Focal slowing indicates cerebral dysfunction from a wide variety of potential etiologies in the region affected; it is a non-specific indicator of a structural or functional abnormality.   Focal epileptiform potentials can be seen in patients with partial-onset seizures, but do not in themselves confirm a diagnosis of seizures or epilepsy.    Interpreting Attending: Melba Coon, MD

## 2022-03-30 LAB — NIACIN (VITAMIN B3)
NICOTINAMIDE: 18.1 ng/mL
NICOTINIC ACID (NIACIN): <5 ng/mL
NICOTINURIC ACID: <5 ng/mL

## 2022-03-30 LAB — ANA: ANTINUCLEAR ANTIBODIES (ANA): NEGATIVE

## 2022-03-30 LAB — VALPROIC ACID LEVEL, TOTAL: VALPROIC ACID TOTAL: 60.6 ug/mL (ref 50.0–100.0)

## 2022-03-30 MED ADMIN — heparin, porcine (PF) 100 unit/mL injection 200 Units: 200 [IU] | INTRAVENOUS | @ 16:00:00 | Stop: 2022-03-30

## 2022-03-30 MED ADMIN — multivitamins, therapeutic with minerals tablet 1 tablet: 1 | ORAL | @ 13:00:00

## 2022-03-30 MED ADMIN — diphenhydrAMINE (BENADRYL) injection: 12.5 mg | INTRAVENOUS | @ 02:00:00

## 2022-03-30 MED ADMIN — lamoTRIgine (LaMICtal) tablet 200 mg: 200 mg | ORAL | @ 22:00:00

## 2022-03-30 MED ADMIN — folic acid (FOLVITE) tablet 1 mg: 1 mg | ORAL | @ 13:00:00

## 2022-03-30 MED ADMIN — haloperidol lactate (HALDOL) injection 2 mg: 2 mg | INTRAVENOUS | @ 22:00:00

## 2022-03-30 MED ADMIN — haloperidol lactate (HALDOL) injection 2 mg: 2 mg | INTRAVENOUS | @ 13:00:00

## 2022-03-30 MED ADMIN — LORazepam (ATIVAN) injection 1 mg: 1 mg | INTRAVENOUS | @ 22:00:00

## 2022-03-30 MED ADMIN — magnesium sulfate 2gm/50mL IVPB: 2 g | INTRAVENOUS | @ 13:00:00 | Stop: 2022-03-30

## 2022-03-30 MED ADMIN — diphenhydrAMINE (BENADRYL) injection: 12.5 mg | INTRAVENOUS | @ 22:00:00

## 2022-03-30 MED ADMIN — calcium chloride 100 mg/mL (10 %) syringe 1 g: 1 g | INTRAVENOUS | @ 14:00:00 | Stop: 2022-03-30

## 2022-03-30 MED ADMIN — thiamine mononitrate (vit B1) tablet 100 mg: 100 mg | ORAL | @ 13:00:00

## 2022-03-30 MED ADMIN — acetaminophen (TYLENOL) tablet 650 mg: 650 mg | ORAL | @ 18:00:00

## 2022-03-30 MED ADMIN — valproate (DEPACON) 1,000 mg in sodium chloride (NS) 0.9 % 100 mL IVPB: 1000 mg | INTRAVENOUS | @ 22:00:00

## 2022-03-30 MED ADMIN — propranoloL (INDERAL) tablet 30 mg: 30 mg | ORAL | @ 13:00:00 | Stop: 2022-03-30

## 2022-03-30 MED ADMIN — senna (SENOKOT) tablet 2 tablet: 2 | ORAL | @ 01:00:00 | Stop: 2022-04-11

## 2022-03-30 MED ADMIN — lamoTRIgine (LaMICtal) tablet 200 mg: 200 mg | ORAL | @ 14:00:00

## 2022-03-30 MED ADMIN — valproate (DEPACON) 500 mg in sodium chloride (NS) 0.9 % 50 mL infusion: 500 mg | INTRAVENOUS | @ 12:00:00 | Stop: 2022-03-30

## 2022-03-30 MED ADMIN — heparin, porcine (PF) 100 unit/mL injection: INTRAVENOUS | @ 16:00:00 | Stop: 2022-03-30

## 2022-03-30 MED ADMIN — **PATIENT SUPPLIED** cenobamate 200 mg tablet: 2 | ORAL | @ 01:00:00

## 2022-03-30 MED ADMIN — haloperidol lactate (HALDOL) injection 2 mg: 2 mg | INTRAVENOUS | @ 02:00:00

## 2022-03-30 MED ADMIN — anticoagulant citrate dextrose solution A injection 750 mL: 750 mL | INTRAVENOUS | @ 14:00:00

## 2022-03-30 MED ADMIN — LORazepam (ATIVAN) injection 1 mg: 1 mg | INTRAVENOUS | @ 13:00:00

## 2022-03-30 MED ADMIN — enoxaparin (LOVENOX) syringe 40 mg: 40 mg | SUBCUTANEOUS | @ 01:00:00

## 2022-03-30 MED ADMIN — albumin human 5 % 5 % bottle 3,000 mL: 3000 mL | INTRAVENOUS | @ 14:00:00 | Stop: 2022-03-30

## 2022-03-30 MED ADMIN — diphenhydrAMINE (BENADRYL) injection: 12.5 mg | INTRAVENOUS | @ 13:00:00

## 2022-03-30 MED ADMIN — LORazepam (ATIVAN) injection 1 mg: 1 mg | INTRAVENOUS | @ 02:00:00

## 2022-03-30 MED ADMIN — sodium chloride 0.9% (NS) bolus 500 mL: 500 mL | INTRAVENOUS | @ 15:00:00 | Stop: 2022-03-30

## 2022-03-30 NOTE — Unmapped (Signed)
Austin Banks is here for TPE.   Accessed RIJ apheresis catheter without difficulty; blood return observed.   Pt is AO x 4, slow to respond with slurred speech, PRN's given at beginning of treatment.  1100-patient's BP dropped, patient asymptomatic, alert and talking. Procedure paused. Dr. Lenard Galloway and primary nurse made aware.  1147-procedure restarted post fluid bolus, vital signs stable.   Treatment completed without further complications.   Discharge/post procedure instructions given.

## 2022-03-30 NOTE — Unmapped (Signed)
Neurology Inpatient Team B (NMB)  Daily Progress Note       Patient: Austin Banks  Code Status: Full Code  Level of Care: ICU status.   LOS: 17 days      Overnight Events & Subjective:     - tolerated PLEx #2 with PRNs only, has been off precedex >24 hours  - PRN's x3 overnight  - anticipating PLEx #3 today  - ANA/ENA in process  - check depakote level today  - considering transfer to stepdown if he remains off precedex (monitoring soft pressures with PLEx today)      Physical Exam:     General Exam:   General Appearance: Lying in bed calmly  HEENT: PLEx catheter in place  Lungs: Normal work of breathing.   Heart: Regular rate and rhythm per monitor  Abdomen: Deferred  Extremities: No clubbing or cyanosis.     Neurological Exam:   Mental Status:   Alert, calm. At baseline has mild dysarthria. Did not speak directly with patient today however on recent exam he is a bit slow in his verbal responses but responses are appropriate. States that he remembers everything that has happened to him during this hospital stay.       Cranial Nerves:   Mild L facial droop at rest (baseline) and baseline resting nystagmus     Sensory exam:   Not done today. Previously light touch has been normal in the bilateral upper extremities and bilateral lower extremities.     Motor Exam:   Normal bulk   Antigravity x4 spontaneously. Able to pull against UE restraints with at least 4/5 strength.     Reflexes:   Deferred. Previously 1+ throughout bilaterally, Hoffmann's absent bilaterally, Toes down going bilaterally    Cerebellar/Coordination:  Not assessed     Gait:   Not assessed       Assessment/Plan:     Assessment: Austin Banks is a 31 y.o. 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      # Dysarthria - AMS - Imbalance - Difficulty Ambulating: Patient presented with 2 weeks of worsening balance and fluctuating mental status in the setting of recent medication changes, frequent marijuana use and falls associated with seizure and subsequent head injury. His symptoms are likely medication and drug related given the multiple CNS sedating meds he requires for seizure control with concurrent drug use. Other than the dysarthria which has improved and the multi-directional nystagmus, there are no other focal neurological findings on exam. With his history of medically refractory epilepsy and questionable compliance, cannot rule out subclinical events contributing to his fluctuating mental status as well, but EEG during admission has not revealed any seizures.    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 Given one dose on IV VPA 500mg  and placed on IV maintenance as we await NG tube placement vs PO clearance. Patient endorses compliance with ASMs, however, he spends half the week with friends per father, partying and smoking marijuana and so compliance is questionable. He has had subtherapeutic levels of Lamictal in the past. Valproic acid was obtained on admission but collected after he received 500mg  IV.     Differential diagnosis: Increased seizure burden vs toxic encephalopathy 2/2  medication and drug use      Workup:  Resulted:  - CTH: No acute abnormalities, On personal review, patient has atrophy greater than expected for age cerebellum>cerebral cortex  - CT C/A/P: No abnormal lesions/masses  - CT cervical spine: No fracture or traumatic malalignment of the cervical spine.  - UA unremarkable  - VPA level 74.0  - Vitamin B12 wnl, RPR nonreactive  - Alcohol level (<10.0 4/19pm)  - Utox (+cannabinoids + benzos)  - cvEEG (03/14/22 0430 to 03/15/22 11:30 AM ): There were bilateral independent (L>>R) temporal epileptiform discharges indicating focal cortical irritation and increased potential for seizure activity over there regions. There was moderate encephalopathy which is non-specific in etiology. There are no seizures. EEG discontinued on 4/20; restarted on 5/2 x2 days with bitemporal sharp waves and R>L temporal slowing.  - HIV negative  - lamotrigine level 6.8 (4/20)  - clobazam level 50 (4/19)  - B1 level 113 (4/19)  - GAD 65 antibody elevated 163 (03/14/22)   - previously 40.4 in June 2021  - MRI brain with and without contrast done 5/3: R > L cerebellar atrophy disproportionate for age. No abnormal enhancement    Plan:  - Continue home lamictal 200mg  BID  - Continue IV valproate to 500mg  qAM + 1000mg  nightly at 6pm for seizure and behavioral management   - Continue haldol 2mg  bid standing dose, benadryl 12.5 mg bid standing dose, lorazepam 1mg  bid standing dose   - Continue cenobamate 400mg  at bedtime   - Discontinued onfi due to oversedation  - Discontinued Fycompa 2 mg at bedtime (s4/20, dc 4/27) given literature reports of increased agitation  - Received rituximab 1g IV (4/24)  - plan for PLEx x5 inpatient (i5/2) followed by maintenance PLEx at 2 weeks then monthly thereafter  - Repeat GAD 65 Ab 10 days after last PLEx session    # Dysarthria - Excessive Drooling - C/f Aspiration: Patient had excessive coughing with liquid coming out his nose during bedside swallow eval and thus failed. Possibly medication related as Onfi was initiated recently (02/17/22). Father also notes dysarthria after his marijuana use. CXR suggested mild left lower lobe consolidation c/f possible aspiration PNA but lungs clear on CT chest. NG tube placed with continuous tube feeds upon admission but patient able to tolerate regular diet since 4/21.  - Nutrition consulted; appreciate recs    # Weight Loss - Poor Appetite: Patients father reports ~ 30lb weight loss over the past 6 months. Weight was 87kg in 06/2021, 77.7kg this admission. With his know GAD 65 positivity and tobacco abuse, malignancy screening with CT chest, abdomen and pelvis was obtained and unremarkable. Poor appetite could also be medication and/or tobacco use related.  - Nutrition consulted; appreciate recs     # Psychosis - Schizophrenia: IVC currently in place. Behavioral response called for increasing combativeness in attempt to elope requiring IM Ativan 2mg , followed by Haldol. Patient seems emotionally labile-appearing calm amenable to admission on initial evaluation but after his father left the bedside he became agitated, loud, refusing admission and encroaching on this examiner's personal space. Patient has required multiple psychiatric admissions in the past most recently held in our Psych holding unit in 04/2021 for behavioral problems.   -- Psychiatry consulted, appreciate recs  - IV Depakote 500mg  qAM + 1000mg  nightly (at 6pm) as above   - Depakote levels:   - 4/25 subtherapeutic 38.6   - 4/28 therapeutic at 68.1   - 4/29 subtherapeutic at 48.7              -  5/2 therapeutic at 55.3   - Plan to repeat depakote level on 5/5  - Continue haldol 2mg  BID standing dose (9AM, 6PM)  - Continue Benadryl 12.5 mg BID standing dose (9AM, 6PM)  - Continue lorazepam 1mg  BID standing dose (9AM, 6PM)  - Continue propranolol 30mg  BID in place of home propranolol XR 60mg  daily  - CONTINUE home lamictal 200 mg PO BID  - HOLD home trazodone 200 mg nightly  -- Agitation PRNs:   -- Continue Haldol 2mg  IV bid PRN   -- Continue Ativan 1mg  IV bid PRN   -- Continue Benadryl 12.5mg  IV bid PRN  -- continue thiamine PO/NGT supplementation daily   -- continue folate supplementation and multivitamin daily   -- continue telemetry monitoring    - EKG to monitor QTc 395 (4/23)     # Polysubstance Use: Seen in [facility]. He has been referred to smoking cessation program. Patient endorses daily tobacco use and marijuana use (when visiting friends weekly). Denies recent cocaine use. Utox (+cannabinoids + benzos).  - Nicotine patch daily     Discharge Planning:   - Case management: consulted. Recommendations appreciated.  - Social work: N/A  - PT: consulted, 5xL  - OT: consulted, 3x  - SLP: 3x  - Expected Discharge Disposition: TBD. Referral for [facility] placed. Expanded referrals statewide for inpatient psych admission.      Checklist:  - Diet: Regular diet  - IV fluids: none  - Bowel Regimen: senna  - GI PPX: No GI indications  - DVT PPX: Lovenox 40 mg [place] daily  - Lines/Access: PIV x1, apheresis catheter  - Foley: No     Patient discussed and seen with attending physician, Dr. Lanora Manis.     This patient is co-managed with the NSICU Service. Under this co-management agreement, the NSICU serves as the first-call team. Please page the NSICU team at [phone] for any questions/concerns.  Neurology Team B resident can be reached at [phone].     Alwyn Pea, MD   Neurology, PGY-2     Data Review:       Contact Information:  Family contact: Frederik, Standley (Father)   [phone] (Mobile)    PCP: [facility]    Medications:  Scheduled medications:    cenobamate  2 tablet Oral At bedtime    calcium chloride        calcium chloride        diphenhydrAMINE  12.5 mg Intravenous BID    valproate sodium  1,000 mg Intravenous Nightly    Or    divalproex ER  1,000 mg Oral Nightly    [START ON 03/31/2022] valproate sodium  500 mg Intravenous Daily before breakfast    Or    [START ON 03/31/2022] divalproex ER  500 mg Oral Daily before breakfast    enoxaparin (LOVENOX) injection  40 mg Subcutaneous Q24H SCH    folic acid  1 mg Oral Daily    haloperidol lactate  2 mg Intravenous BID    And    LORazepam  1 mg Intravenous BID    heparin, porcine (PF)        lamoTRIgine  200 mg Oral BID    multivitamins, therapeutic with minerals  1 tablet Oral Daily    polyethylene glycol  17 g Oral Daily    senna  2 tablet Oral Nightly    thiamine mononitrate (vit B1)  100 mg Oral Daily     Continuous infusions:  PRN medications: acetaminophen, anticoagulant citrate dextrose solution A, anticoagulant citrate dextrose solution A, anticoagulant citrate dextrose solution A, haloperidol lactate **AND** diphenhydrAMINE, heparin, porcine (PF), LORazepam    24 hour vital signs:  Temp:  [36.4 ??C (97.6 ??F)-36.8 ??C (98.3 ??F)] 36.8 ??C (98.3 ??F)  Heart Rate:  [67-99] 96  SpO2 Pulse:  [68-97] 89  Resp:  [14-24] 18  BP: (87-131)/(35-97) 108/53  MAP (mmHg):  [49-105] 70  SpO2:  [96 %-100 %] 99 %    Ins and Outs:  I/O this shift:  In: 421.6 [P.O.:360; Other:12; IV Piggyback:49.6]  Out: 900 [Urine:900]    Laboratory values:  All Labs Last 24hrs:   No results found for this or any previous visit (from the past 24 hour(s)).      Imaging:  Pertinent imaging discussed in the A/P section

## 2022-03-30 NOTE — Unmapped (Signed)
Neuro- AO x4. Follows x4. EEG removed. Sitter at bedside. Patient has occasional violent or threatening remarks. Has not been physically aggressive this shift.    Cardiac- NSR on monitor    Heme- Lovenox    Resp- Room air    GI- Regular diet  GU- Voiding    ID- Afebrile  Skin- Intact  Lines- Apheresis catheter. 1 PIV        Problem: VTE (Venous Thromboembolism)  Goal: VTE (Venous Thromboembolism) Symptom Resolution  Outcome: Progressing  Intervention: Prevent or Manage VTE (Venous Thromboembolism)  Recent Flowsheet Documentation  Taken 03/29/2022 1800 by Nash Mantis, RN  Anti-Embolism Device Type: SCD, Knee  Anti-Embolism Intervention: Refused  Anti-Embolism Device Location: BLE  Taken 03/29/2022 1600 by Nash Mantis, RN  Anti-Embolism Device Type: SCD, Knee  Anti-Embolism Intervention: Refused  Anti-Embolism Device Location: BLE  Taken 03/29/2022 1400 by Nash Mantis, RN  Anti-Embolism Device Type: SCD, Knee  Anti-Embolism Intervention: Refused  Anti-Embolism Device Location: BLE  Taken 03/29/2022 1200 by Nash Mantis, RN  Anti-Embolism Device Type: SCD, Knee  Anti-Embolism Intervention: Refused  Anti-Embolism Device Location: BLE  Taken 03/29/2022 1000 by Nash Mantis, RN  Anti-Embolism Device Type: SCD, Knee  Anti-Embolism Intervention: Refused  Anti-Embolism Device Location: BLE  Taken 03/29/2022 0800 by Nash Mantis, RN  Bleeding Precautions: blood pressure closely monitored  VTE Prevention/Management: anticoagulant therapy  Anti-Embolism Device Type: SCD, Knee  Anti-Embolism Intervention: Refused  Anti-Embolism Device Location: BLE     Problem: Seizure, Active Management  Goal: Absence of Seizure/Seizure-Related Injury  Outcome: Progressing  Intervention: Prevent Seizure-Related Injury  Recent Flowsheet Documentation  Taken 03/29/2022 0800 by Nash Mantis, RN  Seizure Precautions: activity supervised     Problem: Fall Injury Risk  Goal: Absence of Fall and Fall-Related Injury  Outcome: Progressing  Intervention: Promote Injury-Free Environment  Recent Flowsheet Documentation  Taken 03/29/2022 0800 by Nash Mantis, RN  Safety Interventions:   aspiration precautions   elopement precautions   lighting adjusted for tasks/safety   low bed   room near unit station     Problem: Skin Injury Risk Increased  Goal: Skin Health and Integrity  Outcome: Progressing  Intervention: Optimize Skin Protection  Recent Flowsheet Documentation  Taken 03/29/2022 1600 by Nash Mantis, RN  Head of Bed [facility]) Positioning: HOB at 30-45 degrees  Taken 03/29/2022 1000 by Nash Mantis, RN  Head of Bed [facility]) Positioning: HOB at 30-45 degrees  Taken 03/29/2022 0800 by Nash Mantis, RN  Pressure Reduction Techniques: frequent weight shift encouraged  Head of Bed (HOB) Positioning: HOB at 30-45 degrees  Pressure Reduction Devices: pressure-redistributing mattress utilized  Skin Protection:   transparent dressing maintained   silicone foam dressing in place   adhesive use limited     Problem: Seizure Disorder Comorbidity  Goal: Maintenance of Seizure Control  Outcome: Ongoing - Unchanged  Intervention: Maintain Seizure-Symptom Control  Recent Flowsheet Documentation  Taken 03/29/2022 0800 by Nash Mantis, RN  Seizure Precautions: activity supervised     Problem: Self-Care Deficit  Goal: Improved Ability to Complete Activities of Daily Living  Outcome: Ongoing - Unchanged     Problem: Violence Risk or Actual  Goal: Anger and Impulse Control  Outcome: Not Progressing     Problem: Adjustment to Illness (Delirium)  Goal: Optimal Coping  Outcome: Not Progressing     Problem: Altered Behavior (Delirium)  Goal: Improved Behavioral Control  Outcome: Not Progressing     Problem: Attention  and Thought Clarity Impairment (Delirium)  Goal: Improved Attention and Thought Clarity  Outcome: Not Progressing     Problem: Sleep Disturbance (Delirium)  Goal: Improved Sleep  Outcome: Not Progressing
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
## 2022-03-30 NOTE — Unmapped (Signed)
I spoke with the patient's father, Austin Banks, via telephone call. I relayed that the patient underwent his third PLEX treatment today given the upcoming weekend. He did have an episode of transient hypotension during his treatment which responded to a fluid bolus. All questions and concerns were addressed.    Geanie Cooley, ACNP  Neurocritical Care

## 2022-03-30 NOTE — Unmapped (Signed)
Pt A&Ox4, but can be inappropriate at times. No complaints of pain, afebrile. Pt calm and cooperative for most of shift. Was attempting to trial without wrist restraints like previous nights, but provider maintained restraints. Pt got a little agitated replacing restraints, prns used. Pt normal sinus, occasionally brady while sleeping, maps >65. Pt slept majority of night.   Problem: Adult Inpatient Plan of Care  Goal: Plan of Care Review  Outcome: Ongoing - Unchanged  Goal: Patient-Specific Goal (Individualized)  Outcome: Ongoing - Unchanged  Goal: Absence of Hospital-Acquired Illness or Injury  Outcome: Ongoing - Unchanged  Intervention: Identify and Manage Fall Risk  Recent Flowsheet Documentation  Taken 03/29/2022 2000 by Alberteen Spindle, RN  Safety Interventions:   aspiration precautions   elopement precautions   fall reduction program maintained  Intervention: Prevent Skin Injury  Recent Flowsheet Documentation  Taken 03/29/2022 2000 by Alberteen Spindle, RN  Skin Protection: adhesive use limited  Intervention: Prevent Infection  Recent Flowsheet Documentation  Taken 03/29/2022 2000 by Alberteen Spindle, RN  Infection Prevention: hand hygiene promoted  Goal: Optimal Comfort and Wellbeing  Outcome: Ongoing - Unchanged  Goal: Readiness for Transition of Care  Outcome: Ongoing - Unchanged  Goal: Rounds/Family Conference  Outcome: Ongoing - Unchanged     Problem: Seizure Disorder Comorbidity  Goal: Maintenance of Seizure Control  Outcome: Ongoing - Unchanged  Intervention: Maintain Seizure-Symptom Control  Recent Flowsheet Documentation  Taken 03/29/2022 2000 by Alberteen Spindle, RN  Seizure Precautions: activity supervised     Problem: Self-Care Deficit  Goal: Improved Ability to Complete Activities of Daily Living  Outcome: Ongoing - Unchanged     Problem: Violence Risk or Actual  Goal: Anger and Impulse Control  Outcome: Ongoing - Unchanged     Problem: VTE (Venous Thromboembolism)  Goal: VTE (Venous Thromboembolism) Symptom Resolution  Outcome: Ongoing - Unchanged  Intervention: Prevent or Manage VTE (Venous Thromboembolism)  Recent Flowsheet Documentation  Taken 03/29/2022 2000 by Alberteen Spindle, RN  Bleeding Precautions: blood pressure closely monitored     Problem: Seizure, Active Management  Goal: Absence of Seizure/Seizure-Related Injury  Outcome: Ongoing - Unchanged  Intervention: Prevent Seizure-Related Injury  Recent Flowsheet Documentation  Taken 03/29/2022 2000 by Alberteen Spindle, RN  Seizure Precautions: activity supervised     Problem: Adjustment to Illness (Delirium)  Goal: Optimal Coping  Outcome: Ongoing - Unchanged     Problem: Altered Behavior (Delirium)  Goal: Improved Behavioral Control  Outcome: Ongoing - Unchanged     Problem: Attention and Thought Clarity Impairment (Delirium)  Goal: Improved Attention and Thought Clarity  Outcome: Ongoing - Unchanged     Problem: Sleep Disturbance (Delirium)  Goal: Improved Sleep  Outcome: Ongoing - Unchanged     Problem: Fall Injury Risk  Goal: Absence of Fall and Fall-Related Injury  Outcome: Ongoing - Unchanged  Intervention: Promote Injury-Free Environment  Recent Flowsheet Documentation  Taken 03/29/2022 2000 by Alberteen Spindle, RN  Safety Interventions:   aspiration precautions   elopement precautions   fall reduction program maintained     Problem: Skin Injury Risk Increased  Goal: Skin Health and Integrity  Outcome: Ongoing - Unchanged  Intervention: Optimize Skin Protection  Recent Flowsheet Documentation  Taken 03/30/2022 0400 by Alberteen Spindle, RN  Head of Bed [facility]) Positioning: HOB at 30 degrees  Taken 03/29/2022 2000 by Alberteen Spindle, RN  Pressure Reduction Techniques: frequent weight shift encouraged  Head of Bed (HOB) Positioning: HOB at 30 degrees  Pressure Reduction Devices: pressure-redistributing mattress utilized  Skin Protection: adhesive use  limited

## 2022-03-30 NOTE — Unmapped (Signed)
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    Procedure #: 3/5     Labs:   HCT   Date Value Ref Range Status   03/29/2022 37.0 (L) 39.0 - 48.0 % Final   09/15/2014 38.4 (L) 41.0 - 53.0 % Final     Platelet   Date Value Ref Range Status   03/29/2022 192 150 - 450 10*9/L Final   09/15/2014 231 150 - 440 10*9/L Final       Premedications:  None    Procedure:  Following a timeout, the patient was accessed by the treating RN and good return blood flow was achieved.  A 1.0 plasma volume Therapeutic Plasma Exchange was performed.    Replacement Fluid: 5% albumin  Calcium replacement: None  Complications: No significant complications. Asymptomatic drop in blood pressure (87/44 mmHg) that improved with a fluid bolus.   Fluid balance: +12 mL      The procedure was well tolerated.    For more details about this procedure, see the patient's encounter for this procedure, and go to the All Flowsheet Templates section and see the [facility] Plasma Exchange Treatment Flowsheet.    Vedia Pereyra, D.O.  Department of Laboratory Medicine and Pathology  Resident Physician, PGY-1  Pager: [phone]    Transfusion Medicine Attending Attestation:  I was present/available throughout the entire procedure. I reviewed the Resident???s note. I agree with the Resident???s findings.  For questions regarding the procedure, contact the Transfusion Medicine resident on call.     Silas Sacramento, MD MS  Transfusion Medicine Service

## 2022-03-30 NOTE — Unmapped (Signed)
[facility]   Patient Onboarding/Medication Counseling    Austin Banks is a 31 y.o. male with seizure disorder who I am counseling today on continuation of therapy.  I am speaking to the patient's family member, father, Austin Banks .    Was a Nurse, learning disability used for this call? No    Verified patient's date of birth / HIPAA.    Specialty medication(s) to be sent: Neurology: Austin Banks      Non-specialty medications/supplies to be sent: na      Medications not needed at this time: na       The patient declined counseling on medication administration, missed dose instructions, goals of therapy, side effects and monitoring parameters, warnings and precautions, drug/food interactions, and storage, handling precautions, and disposal because they have taken the medication previously. The information in the declined sections below are for informational purposes only and was not discussed with patient.       Medication & Administration     Dosage: 2 TABLETS (400 MG) DAILY    Administration: Administer with or without food. Swallow tablets whole with liquid; do not crush or chew.     Adherence/Missed dose instructions: take missed dose as soon as you remember. If it is close to the time of your next dose, skip the dose and resume with your next scheduled dose.    Goals of Therapy     - reduce seizure burden    Side Effects & Monitoring Parameters   EKG  Potassium        The following side effects should be reported to the provider:  Changes in mood/behavior  New rash  Palpitations       Contraindications, Warnings, & Precautions     Risk of DRESS  Risk of suicidal ideation    Drug/Food Interactions     Medication list reviewed in Epic. The patient was instructed to inform the care team before taking any new medications or supplements. No drug interactions identified.        Storage, Handling Precautions, & Disposal   Store this medication at room temperature.        Current Medications (including OTC/herbals), Comorbidities and Allergies     No current facility-administered medications for this visit.     Current Outpatient Medications   Medication Sig Dispense Refill    cenobamate 200 mg Tab Take 2 tablets (400mg ) by mouth daily. 60 tablet 3     Facility-Administered Medications Ordered in Other Visits   Medication Dose Route Frequency Provider Last Rate Last Admin    **PATIENT SUPPLIED** cenobamate 200 mg tablet  2 tablet Oral At bedtime Di Kindle, ACNP   2 tablet at 03/29/22 2030    acetaminophen (TYLENOL) tablet 650 mg  650 mg Oral Q4H PRN Di Kindle, ACNP   650 mg at 03/29/22 1718    anticoagulant citrate dextrose solution A injection 750 mL  750 mL Intravenous BID PRN Harlow Asa, MD   750 mL at 03/27/22 1502    anticoagulant citrate dextrose solution A injection 750 mL  750 mL Intravenous BID PRN Harlow Asa, MD   750 mL at 03/29/22 1453    anticoagulant citrate dextrose solution A injection 750 mL  750 mL Intravenous BID PRN Harlow Asa, MD   750 mL at 03/30/22 1005    calcium chloride 100 mg/mL (10 %) injection             calcium chloride 100 mg/mL (10 %) injection  diphenhydrAMINE (BENADRYL) injection  12.5 mg Intravenous BID Di Kindle, ACNP   12.5 mg at 03/30/22 0910    haloperidol lactate (HALDOL) injection 2 mg  2 mg Intravenous BID PRN Tacey Ruiz, ACNP   2 mg at 03/29/22 2130    And    diphenhydrAMINE (BENADRYL) injection  12.5 mg Intravenous BID PRN Tacey Ruiz, ACNP   12.5 mg at 03/29/22 2132    valproate (DEPACON) 1,000 mg in sodium chloride (NS) 0.9 % 100 mL IVPB  1,000 mg Intravenous Nightly Tacey Ruiz, ACNP        Or    divalproex ER (DEPAKOTE ER) extended released 24 hr tablet 1,000 mg  1,000 mg Oral Nightly Tacey Ruiz, ACNP        [START ON 03/31/2022] valproate (DEPACON) 500 mg in sodium chloride (NS) 0.9 % 50 mL infusion  500 mg Intravenous Daily before breakfast Tacey Ruiz, ACNP        Or    Melene Muller ON 03/31/2022] divalproex ER (DEPAKOTE ER) extended released 24 hr tablet 500 mg  500 mg Oral Daily before breakfast Tacey Ruiz, ACNP        enoxaparin (LOVENOX) syringe 40 mg  40 mg Subcutaneous Q24H SCH Di Kindle, ACNP   40 mg at 03/29/22 2031    folic acid (FOLVITE) tablet 1 mg  1 mg Oral Daily Di Kindle, ACNP   1 mg at 03/30/22 1610    haloperidol lactate (HALDOL) injection 2 mg  2 mg Intravenous BID Di Kindle, ACNP   2 mg at 03/30/22 9604    And    LORazepam (ATIVAN) injection 1 mg  1 mg Intravenous BID Di Kindle, ACNP   1 mg at 03/30/22 0910    heparin, porcine (PF) 100 unit/mL injection 200 Units  200 Units Intravenous Daily PRN Maryan Char, ACNP        heparin, porcine (PF) 100 unit/mL injection 200 Units  200 Units Intravenous Once Harlow Asa, MD        heparin, porcine (PF) 100 unit/mL injection 200 Units  200 Units Intravenous Once Harlow Asa, MD        heparin, porcine (PF) 100 unit/mL injection             heparin, porcine (PF) 100 unit/mL injection             lamoTRIgine (LaMICtal) tablet 200 mg  200 mg Oral BID Di Kindle, ACNP   200 mg at 03/30/22 1009    LORazepam (ATIVAN) injection 1 mg  1 mg Intravenous BID PRN Di Kindle, ACNP   1 mg at 03/29/22 2133    magnesium sulfate 2gm/30mL IVPB  2 g Intravenous Once Geanie Cooley, ACNP 25 mL/hr at 03/30/22 0859 2 g at 03/30/22 0859    multivitamins, therapeutic with minerals tablet 1 tablet  1 tablet Oral Daily Di Kindle, ACNP   1 tablet at 03/30/22 0908    polyethylene glycol (MIRALAX) packet 17 g  17 g Oral Daily Tacey Ruiz, ACNP        propranoloL (INDERAL) tablet 30 mg  30 mg Oral BID Di Kindle, ACNP   30 mg at 03/30/22 0908    senna (SENOKOT) tablet 2 tablet  2 tablet Oral Nightly Leola Brazil, ACNP   2 tablet at 03/29/22 2030    thiamine mononitrate (vit B1) tablet 100 mg  100 mg Oral Daily Sheran Lawless  Stoney Bang, ACNP   100 mg at 03/30/22 0908       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    Appropriateness of Therapy     Acute infections noted within Epic:  No active infections  Patient reported infection: None    Is medication and dose appropriate based on diagnosis and infection status? Yes    Prescription has been clinically reviewed: Yes      Baseline Quality of Life Assessment      How many days over the past month did your seizures  keep you from your normal activities? For example, brushing your teeth or getting up in the morning. Declined - currently admitted     Financial Information     Medication Assistance provided: Prior Authorization    Anticipated copay of $4 reviewed with patient. Verified delivery address.    Delivery Information     Scheduled delivery date: Friday, 5/5    Expected start date: tbd - CURRENTLY ON THERAPY    Medication will be delivered via [facility] - cop clinic to the temporary address in [place].  This shipment will require a signature.      Explained the services we provide at [facility] and that each month we would call to set up refills.  Stressed importance of returning phone calls so that we could ensure they receive their medications in time each month.  Informed patient that we should be setting up refills 7-10 days prior to when they will run out of medication.  A pharmacist will reach out to perform a clinical assessment periodically.  Informed patient that a welcome packet, containing information about our pharmacy and other support services, a Notice of Privacy Practices, and a drug information handout will be sent.      The patient or caregiver noted above participated in the development of this care plan and knows that they can request review of or adjustments to the care plan at any time.      Patient or caregiver verbalized understanding of the above information as well as how to contact the pharmacy at [phone] option 4 with any questions/concerns.  The pharmacy is open Monday through Friday 8:30am-4:30pm.  A pharmacist is available 24/7 via pager to answer  any clinical questions they may have.    Patient Specific Needs     Does the patient have any physical, cognitive, or cultural barriers? Yes - currently with decreased cognition - will plan to coordinate care with parents    Does the patient have adequate living arrangements? (i.e. the ability to store and take their medication appropriately) Yes    Did you identify any home environmental safety or security hazards? No    Patient prefers to have medications discussed with  Family Member     Is the patient or caregiver able to read and understand education materials at a high school level or above? Yes    Patient's primary language is  English     Is the patient high risk? No    Lanson Randle A Desiree [facility] Specialty Pharmacist

## 2022-03-30 NOTE — Unmapped (Signed)
Neurocritical Care Admission Note     CODE STATUS:    Code Status: Full Code    HCDM (patient stated preference): Zachary, Nole - Father - [phone]    HCDM (patient stated preference): Braheem, Tomasik - Mother - [phone]  I discussed and confirmed Code Status with patient or HCDM    Date of service: 03/30/2022  Hospital Day:  LOS: 17 days        HPI     Austin Banks is a 31 y.o. 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 The patient was admitted to the Neurology floor service for further workup. The patient was placed on EEG monitoring but was negative for seizures. The patient was also noted to have an increase in GAD 65 levels to 163 (from 40.4 in 2021). Given this increase, PLEX treatment was planned. However, since admission, the patient has been increasingly agitated and would not be compliant with PLEX treatment. The patient is now being admitted to the NSICU for possible conscious sedation for catheter placement and subsequent PLEX treatment.     Assessment and Plan     Tywone Bembenek is a 31 y.o. male admitted to NSICU with a diagnosis of GAD 37 associated seizures and encephalopathy    24 hour events:  Tolerated PLEX well with premedication    Neuro:  Mobility goal: 2 (HOB elevation)    **Seizures  - known hx of seizures; recent increase in frequency  - seizure semiology (per neurology notes from previous admissions): Begins with right eye deviation, left facial clonic jerking, eye blinking, mumbling. LUE flexed with coordinated back and forth movements of his bilateral lower extremities and drool from the left side of his mouth. All movement abruptly ceases at ictal offset with pelvic thrusting. Family also describes hand automatisms after shaking at times. Post-ictal confusion lasting up to 20 minutes. - previous EEG monitoring from negative for seizures  - - 2HELPS2B Score components: Sporadic Epileptiform Discharges = 1, Brief Ictal Appearing Rhythmic Discharges (BIRDs) = 2  and Prior Clinical Seizures or Hx of Epilepsy = 1  Total 2HELPS2B Seizure Risk Score: 4  - No seizures captured on EEG  - continue cenobamate 400mg  at bedtime,  lamictal 200mg  BID and valproate 500mg  qAM + 1g nightly  - 5/2 valproate level 55.3    **Encephalopathy  - likely d/t elevated GAD 65   - GAD 65 elevated at 163 (from 40.4 in 2021)  - previously received monthly IVIG treatments  - PLEX treatment (3/5 session, on 5/6) - pre-medicate with Benadryl and Lorazepam today.  If needed, may utilize prn haldol as rescue  - seizure management as above  - previously encephalopathy workup unremarkable  - ENA/ANA pending  - Possible LP for GAD 65 Ab levels, discuss with neurology    **Polysubstance abuse  - current marijuana and tobacco use, remote cocaine use  - continue thiamine, multivitamins, and folate  - monitor for signs of withdrawal    **Psychosis/Schizophrenia/Agitation  - increasingly agitated since admission to [facility]  - has required multiple psychiatric admissions in the past, most recently at [facility] holding unit in 04/2021 for behavioral problems  - Psychiatry following, appreciate recs  - continue scheduled and PRN haldol 2mg  BID, benadryl 1mg  BID, and lorazepam 1mg   BID      Cardiovascular:  Admission weight: 77.1kg  Daily Weight: 78 kg (172 lb)     Hemodynamic goals:  - MAP > 65  - SBP < 180  - transition vital signs to Q4 hours unless on Precedex or during PLEX      Pulmonary    - stable on room air    **tobacco abuse  - every day smoker, 10 pack years  - place nicotine patch  - smoking cessation counseling      FEN  Body mass index is 23.33 kg/m??.    GI prophylaxis: not indicated  Last BM Date: 03/27/22  Foley catheter: No foley in place. **Nutrition  - start PO diet    **Glycemic control  - none needed    **Fluids/electrolytes  - goal euvolemia  - follow strict ins/outs  - check admission chemistry panel and supplement electrolytes as needed      Heme/ID  Current DVT prophylaxis: SCDs, Lovenox 40 mg daily    - start chemical DVT prophylaxis given high risk    Labs on PLEX days    Current Access:         -  PIV x 2       - Arterial line: No arterial line present.       - Central venous line: Central line required for PLEX.    Tubes and drains:  Patient Lines/Drains/Airways Status       Active Active Lines, Drains, & Airways       Name Placement date Placement time Site Days    Apheresis Catheter 03/27/22 Non-tunneled 03/27/22  1427  Internal jugular  2    Peripheral IV 03/26/22 Posterior;Right Forearm 03/26/22  1835  Forearm  3                           Objective Data     All vital signs and resulted laboratory studies for the past 24 hours have been reviewed.  All ordered medications have been reviewed.    Temp:  [36.4 ??C (97.6 ??F)-36.8 ??C (98.2 ??F)] 36.7 ??C (98 ??F)  Heart Rate:  [66-87] 67  SpO2 Pulse:  [68-86] 68  Resp:  [12-24] 21  BP: (86-121)/(53-75) 115/64  MAP (mmHg):  [63-89] 78  SpO2:  [96 %-99 %] 98 %    Intake/Output Summary (Last 24 hours) at 03/30/2022 0753  Last data filed at 03/29/2022 1800  Gross per 24 hour   Intake 2107.33 ml   Output 550 ml   Net 1557.33 ml          Physical Exam          General Exam:  General: Lying in bed. Appears comfortable. No obvious distress.   ENT:  Mucous membranes moist. Oropharynx clear.  Cardiovascular:  Regular rate and rhythm.  No murmurs.   2+ radial pulses bilaterally.    Respiratory: Breathing is comfortable and unlabored.  Lungs are clear to ausculation bilaterally.    Gastrointestinal: Soft, nontender, nondistended.  Extremities: Warm and well-perfused. No cyanosis, clubbing, or edema.  Skin: No obvious rashes or ecchymoses.    Neurological Exam:  Mental Status  LOC: awake and alert  Orientation: Oriented x 4  Neglect: not present  Speech: normal  Language: UTA    Cranial Nerves  Pupils: Does not let you evaluate  Gaze: Resting nystagmus  Face Motor: normal and symmetric  Cough: strong  Left facial twitching  Motor:   RUE: spontaneous and 5/5  LUE: spontaneous and 5/5  RLE: spontaneous and 5/5  LLE: spontaneous and 5/5     Sensory:    Sensory: UTA    Glasgow Coma Score  Motor: localizing to pain = 5  Verbal: confused = 4  Eyes: eyes open spontaneously = 4        DISPOSITION     The patient requires admission to the [facility] for management of the above conditions.    Estimated Transfer Date:  5/6        BILLING     This is a subsequent evaluation of the patient.    Terri Skains, MD  Neurocritical Care Attending

## 2022-03-31 LAB — CBC
HEMATOCRIT: 40 % (ref 39.0–48.0)
HEMOGLOBIN: 13.6 g/dL (ref 12.9–16.5)
MEAN CORPUSCULAR HEMOGLOBIN CONC: 34.1 g/dL (ref 32.0–36.0)
MEAN CORPUSCULAR HEMOGLOBIN: 31.4 pg (ref 25.9–32.4)
MEAN CORPUSCULAR VOLUME: 92 fL (ref 77.6–95.7)
MEAN PLATELET VOLUME: 8.3 fL (ref 6.8–10.7)
PLATELET COUNT: 167 10*9/L (ref 150–450)
RED BLOOD CELL COUNT: 4.35 10*12/L (ref 4.26–5.60)
RED CELL DISTRIBUTION WIDTH: 13.5 % (ref 12.2–15.2)
WBC ADJUSTED: 7.1 10*9/L (ref 3.6–11.2)

## 2022-03-31 LAB — BASIC METABOLIC PANEL
ANION GAP: 9 mmol/L (ref 5–14)
BLOOD UREA NITROGEN: 7 mg/dL — ABNORMAL LOW (ref 9–23)
BUN / CREAT RATIO: 10
CALCIUM: 8.9 mg/dL (ref 8.7–10.4)
CHLORIDE: 108 mmol/L — ABNORMAL HIGH (ref 98–107)
CO2: 23 mmol/L (ref 20.0–31.0)
CREATININE: 0.7 mg/dL
EGFR CKD-EPI (2021) MALE: >90 mL/min/{1.73_m2} (ref >=60)
GLUCOSE RANDOM: 94 mg/dL (ref 70–179)
POTASSIUM: 4.4 mmol/L (ref 3.4–4.8)
SODIUM: 140 mmol/L (ref 135–145)

## 2022-03-31 LAB — METHYLMALONIC ACID, SERUM: METHYLMALONIC ACID: 0.12 nmol/mL

## 2022-03-31 LAB — MAGNESIUM: MAGNESIUM: 1.8 mg/dL (ref 1.6–2.6)

## 2022-03-31 LAB — PHOSPHORUS: PHOSPHORUS: 4.4 mg/dL (ref 2.4–5.1)

## 2022-03-31 MED ADMIN — diphenhydrAMINE (BENADRYL) injection: 12.5 mg | INTRAVENOUS | @ 13:00:00

## 2022-03-31 MED ADMIN — folic acid (FOLVITE) tablet 1 mg: 1 mg | ORAL | @ 13:00:00

## 2022-03-31 MED ADMIN — magnesium sulfate 2gm/50mL IVPB: 2 g | INTRAVENOUS | @ 12:00:00 | Stop: 2022-03-31

## 2022-03-31 MED ADMIN — propranoloL (INDERAL) tablet 20 mg: 20 mg | ORAL | @ 13:00:00

## 2022-03-31 MED ADMIN — senna (SENOKOT) tablet 2 tablet: 2 | ORAL | @ 01:00:00 | Stop: 2022-04-11

## 2022-03-31 MED ADMIN — diphenhydrAMINE (BENADRYL) injection: 12.5 mg | INTRAVENOUS | @ 22:00:00

## 2022-03-31 MED ADMIN — divalproex ER (DEPAKOTE ER) extended released 24 hr tablet 500 mg: 500 mg | ORAL | @ 13:00:00

## 2022-03-31 MED ADMIN — divalproex ER (DEPAKOTE ER) extended released 24 hr tablet 1,000 mg: 1000 mg | ORAL | @ 22:00:00

## 2022-03-31 MED ADMIN — polyethylene glycol (MIRALAX) packet 17 g: 17 g | ORAL | @ 13:00:00

## 2022-03-31 MED ADMIN — oxyCODONE (ROXICODONE) immediate release tablet 5 mg: 5 mg | ORAL | @ 21:00:00 | Stop: 2022-04-03

## 2022-03-31 MED ADMIN — lamoTRIgine (LaMICtal) tablet 200 mg: 200 mg | ORAL | @ 22:00:00

## 2022-03-31 MED ADMIN — thiamine mononitrate (vit B1) tablet 100 mg: 100 mg | ORAL | @ 13:00:00

## 2022-03-31 MED ADMIN — **PATIENT SUPPLIED** cenobamate 200 mg tablet: 2 | ORAL | @ 01:00:00

## 2022-03-31 MED ADMIN — LORazepam (ATIVAN) injection 1 mg: 1 mg | INTRAVENOUS | @ 22:00:00

## 2022-03-31 MED ADMIN — LORazepam (ATIVAN) injection 1 mg: 1 mg | INTRAVENOUS | @ 13:00:00

## 2022-03-31 MED ADMIN — acetaminophen (TYLENOL) tablet 650 mg: 650 mg | ORAL | @ 01:00:00

## 2022-03-31 MED ADMIN — lamoTRIgine (LaMICtal) tablet 200 mg: 200 mg | ORAL | @ 13:00:00

## 2022-03-31 MED ADMIN — multivitamins, therapeutic with minerals tablet 1 tablet: 1 | ORAL | @ 13:00:00

## 2022-03-31 MED ADMIN — haloperidol lactate (HALDOL) injection 2 mg: 2 mg | INTRAVENOUS | @ 22:00:00

## 2022-03-31 MED ADMIN — enoxaparin (LOVENOX) syringe 40 mg: 40 mg | SUBCUTANEOUS | @ 01:00:00

## 2022-03-31 MED ADMIN — haloperidol lactate (HALDOL) injection 2 mg: 2 mg | INTRAVENOUS | @ 13:00:00

## 2022-03-31 NOTE — Unmapped (Signed)
Neurocritical Care Admission Note     CODE STATUS:    Code Status: Full Code    HCDM (patient stated preference): Austin Banks - Father - [phone]    HCDM (patient stated preference): Austin Banks - Mother - [phone]  I discussed and confirmed Code Status with patient or HCDM    Date of service: 03/31/2022  Hospital Day:  LOS: 18 days        HPI     Austin Banks is a 31 y.o. 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 The patient was admitted to the Neurology floor service for further workup. The patient was placed on EEG monitoring but was negative for seizures. The patient was also noted to have an increase in GAD 65 levels to 163 (from 40.4 in 2021). Given this increase, PLEX treatment was planned. However, since admission, the patient has been increasingly agitated and would not be compliant with PLEX treatment. The patient is now being admitted to the NSICU for possible conscious sedation for catheter placement and subsequent PLEX treatment.     Assessment and Plan     Austin Banks is a 31 y.o. male admitted to NSICU with a diagnosis of GAD 9 associated seizures and encephalopathy    24 hour events:  Pulled plex catheter today  Discussed with him the need to place the catheter back later this weekend to finish the PLEX.  He asks if he needs to do that because he likes his antibiodies.    Neuro:  Mobility goal: 2 (HOB elevation)    **Seizures  - known hx of seizures; recent increase in frequency  - seizure semiology (per neurology notes from previous admissions): Begins with right eye deviation, left facial clonic jerking, eye blinking, mumbling. LUE flexed with coordinated back and forth movements of his bilateral lower extremities and drool from the left side of his mouth. All movement abruptly ceases at ictal offset with pelvic thrusting. Family also describes hand automatisms after shaking at times. Post-ictal confusion lasting up to 20 minutes. 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  If needed, may utilize prn haldol as rescue  - seizure management as above  - previously encephalopathy workup unremarkable  - ENA/ANA pending  - Will need replacement PLEX catheter, but will defer for today for his comfort and place on Sunday in anticipation for Monday    **Polysubstance abuse  - current marijuana and tobacco use, remote cocaine use  - continue thiamine, multivitamins, and folate  - monitor for signs of withdrawal    **Psychosis/Schizophrenia/Agitation  - increasingly agitated since admission to  [facility]  - has required multiple psychiatric admissions in the past, most recently at [facility] holding unit in 04/2021 for behavioral problems  - Psychiatry following, appreciate recs  - continue scheduled and PRN haldol 2mg  BID, benadryl 1mg  BID, and lorazepam 1mg  BID  - propanolol 20mg  BID  - Has not required PRN since the 4th ( except during PLEX)    Cardiovascular:  Admission weight: 77.1kg  Daily Weight: 78 kg (172 lb)     Hemodynamic goals:  - MAP > 65  - SBP < 180  - transition vital signs to Q4 hours unless on Precedex or during PLEX    Pulmonary    - stable on room air    **tobacco abuse  - every day smoker, 10 pack years  - place nicotine patch  - smoking cessation counseling      FEN  Body mass index is 23.33 kg/m??.    GI prophylaxis: not indicated  Last BM Date: 03/30/22  Foley catheter: No foley in place.        **Nutrition  - start PO diet    **Glycemic control  - none needed    **Fluids/electrolytes  - goal euvolemia  - follow strict ins/outs  - check admission chemistry panel and supplement electrolytes as needed      Heme/ID  Current DVT prophylaxis: SCDs, Lovenox 40 mg daily    - start chemical DVT prophylaxis given high risk    Labs on PLEX days    Current Access:         -  PIV x 2       - Arterial line: No arterial line present.       - Central venous line: Central line required for PLEX.    Tubes and drains:  Patient Lines/Drains/Airways Status       Active Active Lines, Drains, & Airways       Name Placement date Placement time Site Days    Peripheral IV 03/26/22 Posterior;Right Forearm 03/26/22  1835  Forearm  4                           Objective Data     All vital signs and resulted laboratory studies for the past 24 hours have been reviewed.  All ordered medications have been reviewed.    Temp:  [36.5 ??C (97.7 ??F)-36.8 ??C (98.3 ??F)] 36.5 ??C (97.7 ??F)  Heart Rate:  [77-99] 86  SpO2 Pulse:  [77-97] 85  Resp:  [15-26] 17  BP: (87-138)/(35-97) 123/60  MAP (mmHg):  [49-105] 79  SpO2:  [97 %-100 %] 99 %    Intake/Output Summary (Last 24 hours) at 03/31/2022 0828  Last data filed at 03/31/2022 0800  Gross per 24 hour   Intake 2368.58 ml   Output 2250 ml   Net 118.58 ml          Physical Exam          General Exam:  General: Lying in bed. Appears comfortable. No obvious distress.   ENT:  Mucous membranes moist. Oropharynx clear.  Cardiovascular:  Regular rate and rhythm.  No murmurs.   2+ radial pulses bilaterally.    Respiratory: Breathing is comfortable and unlabored.  Lungs are clear to ausculation bilaterally. Gastrointestinal: Soft, nontender, nondistended.  Extremities: Warm and well-perfused. No cyanosis, clubbing, or edema.  Skin: No obvious rashes or ecchymoses.    Neurological Exam:  Mental Status  LOC: awake and alert  Orientation: Oriented x 4  Neglect: not present  Speech: normal  Language: UTA    Cranial Nerves  Pupils: Does not let you evaluate  Gaze: Resting nystagmus  Face Motor: normal and symmetric  Cough: strong  Left facial twitching    Motor:   RUE: spontaneous and 5/5  LUE: spontaneous and 5/5  RLE: spontaneous and 5/5  LLE: spontaneous and 5/5     Sensory:    Sensory: UTA    Glasgow Coma Score  Motor: obeys commands = 6  Verbal: oriented = 5  Eyes: eyes open spontaneously = 4        DISPOSITION     The patient requires admission to the [facility] for management of the above conditions.    Estimated Transfer Date:  5/8 PLEX catheter placement        BILLING     This is a subsequent evaluation of the patient.    Terri Skains, MD  Neurocritical Care Attending

## 2022-03-31 NOTE — Unmapped (Signed)
Neurology Inpatient Team B (NMB)  Daily Progress Note       Patient: Austin Banks  Code Status: Full Code  Level of Care: ICU status.   LOS: 18 days      Overnight Events & Subjective:     - tolerated PLEx #3 with PRNs only  - depakote level 5/5 therapeutic at 60.6  - ANA negative; ENA in process  - per nursing note, patient pulled apheresis catheter with restraints on overnight      Physical Exam:     General Exam:   General Appearance: Lying in bed calmly  HEENT: deferred  Lungs: Normal work of breathing.    Heart: Regular rate and rhythm per monitor  Abdomen: Deferred  Extremities: No clubbing or cyanosis.     Neurological Exam:   Mental Status:   Alert, calm. At baseline has mild dysarthria. Did not speak directly with patient today however on recent exam he is a bit slow in his verbal responses but responses are appropriate.       Cranial Nerves:   Mild L facial droop at rest (baseline) and baseline resting nystagmus     Sensory exam:   Not done today. Previously light touch has been normal in the bilateral upper extremities and bilateral lower extremities.     Motor Exam:   Normal bulk   Antigravity x4 spontaneously. Able to pull against UE restraints with at least 4/5 strength.     Reflexes:   Deferred. Previously 1+ throughout bilaterally, Hoffmann's absent bilaterally, Toes down going bilaterally    Cerebellar/Coordination:  Not assessed     Gait:   Not assessed       Assessment/Plan:     Assessment: Austin Banks is a 31 y.o. 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      # Dysarthria - AMS - Imbalance - Difficulty Ambulating: Patient presented with 2 weeks of worsening balance and fluctuating mental status in the setting of recent medication changes, frequent marijuana use and falls associated with seizure and subsequent head injury. His symptoms are likely medication and drug related given the multiple CNS sedating meds he requires for seizure control with concurrent drug use. Other than the dysarthria which has improved and the multi-directional nystagmus, there are no other focal neurological findings on exam. With his history of medically refractory epilepsy and questionable compliance, cannot rule out subclinical events contributing to his fluctuating mental status as well, but EEG during admission has not revealed any seizures.    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 Given one dose on IV VPA 500mg  and placed on IV maintenance as we await NG tube placement vs PO clearance. Patient endorses compliance with ASMs, however, he spends half the week with friends per father, partying and smoking marijuana and so compliance is questionable. He has had subtherapeutic levels of Lamictal in the past. Valproic acid was obtained on admission but collected after he received 500mg  IV.     Differential diagnosis: Increased seizure burden vs toxic encephalopathy 2/2 medication and drug use      Workup:  Resulted:  - CTH: No acute abnormalities, On personal review, patient has atrophy greater than expected for age cerebellum>cerebral cortex  -  CT C/A/P: No abnormal lesions/masses  - CT cervical spine: No fracture or traumatic malalignment of the cervical spine.  - UA unremarkable  - VPA level 74.0  - Vitamin B12 wnl, RPR nonreactive  - Alcohol level (<10.0 4/19pm)  - Utox (+cannabinoids + benzos)  - cvEEG (03/14/22 0430 to 03/15/22 11:30 AM ): There were bilateral independent (L>>R) temporal epileptiform discharges indicating focal cortical irritation and increased potential for seizure activity over there regions. There was moderate encephalopathy which is non-specific in etiology. There are no seizures. EEG discontinued on 4/20; restarted on 5/2 x2 days with bitemporal sharp waves and R>L temporal slowing.  - HIV negative  - lamotrigine level 6.8 (4/20)  - clobazam level 50 (4/19)  - B1 level 113 (4/19)  - GAD 65 antibody elevated 163 (03/14/22)   - previously 40.4 in June 2021  - MRI brain with and without contrast done 5/3: R > L cerebellar atrophy disproportionate for age. No abnormal enhancement  - ANA negative    Plan:  - Continue home lamictal 200mg  BID  - Continue IV valproate to 500mg  qAM + 1000mg  nightly at 6pm for seizure and behavioral management   - Continue haldol 2mg  bid standing dose, benadryl 12.5 mg bid standing dose, lorazepam 1mg  bid standing dose   - Continue cenobamate 400mg  at bedtime   - Discontinued onfi due to oversedation  - Discontinued Fycompa 2 mg at bedtime (s4/20, dc 4/27) given literature reports of increased agitation  - Received rituximab 1g IV (4/24)  - plan for PLEx x5 inpatient (5/2, 5/4, 5/5) followed by maintenance PLEx at 2 weeks then monthly thereafter  - Repeat GAD 65 Ab 10 days after last PLEx session    # Dysarthria - Excessive Drooling - C/f Aspiration: Patient had excessive coughing with liquid coming out his nose during bedside swallow eval and thus failed. Possibly medication related as Onfi was initiated recently (02/17/22). Father also notes dysarthria after his marijuana use. CXR suggested mild left lower lobe consolidation c/f possible aspiration PNA but lungs clear on CT chest. NG tube placed with continuous tube feeds upon admission but patient able to tolerate regular diet since 4/21.  - Nutrition consulted; appreciate recs    # Weight Loss - Poor Appetite: Patients father reports ~ 30lb weight loss over the past 6 months. Weight was 87kg in 06/2021, 77.7kg this admission. With his know GAD 65 positivity and tobacco abuse, malignancy screening with CT chest, abdomen and pelvis was obtained and unremarkable. Poor appetite could also be medication and/or tobacco use related.  - Nutrition consulted; appreciate recs     # Psychosis - Schizophrenia: IVC currently in place. Behavioral response called for increasing combativeness in attempt to elope requiring IM Ativan 2mg , followed by Haldol. Patient seems emotionally labile-appearing calm amenable to admission on initial evaluation but after his father left the bedside he became agitated, loud, refusing admission and encroaching on this examiner's personal space. Patient has required multiple psychiatric admissions in the past most recently held in our Psych holding unit in 04/2021 for behavioral problems.   -- Psychiatry consulted, appreciate recs  - IV Depakote 500mg  qAM + 1000mg  nightly (at 6pm) as above   - Depakote levels:   - 4/25 subtherapeutic 38.6   - 4/28 therapeutic at 68.1   - 4/29 subtherapeutic at 48.7              - 5/2 therapeutic at 55.3              -  5/5 therapeutic at 60.6  - Continue haldol 2mg  BID standing dose (9AM, 6PM)  - Continue Benadryl 12.5 mg BID standing dose (9AM, 6PM)  - Continue lorazepam 1mg  BID standing dose (9AM, 6PM)  - Continue propranolol 20mg  BID in place of home propranolol XR 60mg  daily  - CONTINUE home lamictal 200 mg PO BID  - HOLD home trazodone 200 mg nightly  -- Agitation PRNs:   -- Continue Haldol 2mg  IV bid PRN   -- Continue Ativan 1mg  IV bid PRN   -- Continue Benadryl 12.5mg  IV bid PRN  -- continue thiamine PO/NGT supplementation daily   -- continue folate supplementation and multivitamin daily   -- continue telemetry monitoring    - EKG to monitor QTc (405 on 5/4)     # Polysubstance Use: Seen in [facility]. He has been referred to smoking cessation program. Patient endorses daily tobacco use and marijuana use (when visiting friends weekly). Denies recent cocaine use. Utox (+cannabinoids + benzos).  - Nicotine patch daily     Discharge Planning:   - Case management: consulted. Recommendations appreciated.  - Social work: N/A  - PT: consulted, 5xL  - OT: consulted, 3x  - SLP: 3x  - Expected Discharge Disposition: TBD. Referral for [facility] placed. Expanded referrals statewide for inpatient psych admission.      Checklist:  - Diet: Regular diet  - IV fluids: none  - Bowel Regimen: senna  - GI PPX: No GI indications  - DVT PPX: Lovenox 40 mg [place] daily  - Lines/Access: PIV x1  - Foley: No     Patient discussed and seen with attending physician, Dr. Lanora Manis.     This patient is co-managed with the NSICU Service. Under this co-management agreement, the NSICU serves as the first-call team. Please page the NSICU team at [phone] for any questions/concerns.  Neurology Team B resident can be reached at [phone].     Alwyn Pea, MD   Neurology, PGY-2     Data Review:       Contact Information:  Family contact: Arles, Rumbold (Father)   [phone] (Mobile)    PCP: [facility]    Medications:  Scheduled medications:    cenobamate  2 tablet Oral At bedtime    calcium chloride        calcium chloride        diphenhydrAMINE  12.5 mg Intravenous BID    valproate sodium  1,000 mg Intravenous Nightly    Or    divalproex ER  1,000 mg Oral Nightly    valproate sodium  500 mg Intravenous Daily before breakfast    Or    divalproex ER  500 mg Oral Daily before breakfast    enoxaparin (LOVENOX) injection  40 mg Subcutaneous Q24H SCH    folic acid  1 mg Oral Daily    haloperidol lactate  2 mg Intravenous BID    And    LORazepam  1 mg Intravenous BID    heparin, porcine (PF)        lamoTRIgine  200 mg Oral BID    magnesium sulfate  2 g Intravenous Once    multivitamins, therapeutic with minerals  1 tablet Oral Daily    polyethylene glycol  17 g Oral Daily    propranoloL  20 mg Oral BID    senna  2 tablet Oral Nightly    thiamine mononitrate (vit B1)  100 mg Oral Daily     Continuous infusions:  PRN medications: acetaminophen, anticoagulant citrate dextrose solution A, anticoagulant citrate dextrose solution A, anticoagulant citrate dextrose solution A, haloperidol lactate **AND** diphenhydrAMINE, heparin, porcine (PF), LORazepam    24 hour vital signs:  Temp:  [36.5 ??C (97.7 ??F)-36.8 ??C (98.3 ??F)] 36.5 ??C (97.7 ??F)  Heart Rate:  [72-99] 85  SpO2 Pulse:  [77-97] 85  Resp:  [14-26] 21  BP: (87-138)/(35-97) 123/60  MAP (mmHg):  [49-105] 79  SpO2:  [97 %-100 %] 100 %    Ins and Outs:  No intake/output data recorded.    Laboratory values:  All Labs Last 24hrs:   Recent Results (from the past 24 hour(s))   Valproic Acid Level    Collection Time: 03/30/22  6:07 PM   Result Value Ref Range    Valproic Acid, Total 60.6 50.0 - 100.0 ug/mL   Basic Metabolic Panel    Collection Time: 03/31/22  3:57 AM   Result Value Ref Range    Sodium 140 135 - 145 mmol/L    Potassium 4.4 3.4 - 4.8 mmol/L    Chloride 108 (H) 98 - 107 mmol/L    CO2 23.0 20.0 - 31.0 mmol/L    Anion Gap 9 5 - 14 mmol/L    BUN 7 (L) 9 - 23 mg/dL    Creatinine 1.61 0.96 - 1.10 mg/dL    BUN/Creatinine Ratio 10     eGFR CKD-EPI (2021) Male >90 >=60 mL/min/1.23m2    Glucose 94 70 - 179 mg/dL    Calcium 8.9 8.7 - 04.5 mg/dL   Magnesium Level    Collection Time: 03/31/22  3:57 AM   Result Value Ref Range    Magnesium 1.8 1.6 - 2.6 mg/dL   Phosphorus Level    Collection Time: 03/31/22  3:57 AM   Result Value Ref Range    Phosphorus 4.4 2.4 - 5.1 mg/dL   CBC    Collection Time: 03/31/22  3:57 AM   Result Value Ref Range    WBC 7.1 3.6 - 11.2 10*9/L    RBC 4.35 4.26 - 5.60 10*12/L    HGB 13.6 12.9 - 16.5 g/dL    HCT 40.9 81.1 - 91.4 %    MCV 92.0 77.6 - 95.7 fL    MCH 31.4 25.9 - 32.4 pg    MCHC 34.1 32.0 - 36.0 g/dL    RDW 78.2 95.6 - 21.3 %    MPV 8.3 6.8 - 10.7 fL    Platelet 167 150 - 450 10*9/L         Imaging:  Pertinent imaging discussed in the A/P section

## 2022-03-31 NOTE — Unmapped (Incomplete)
Neurocritical Care Admission Note     CODE STATUS:    Code Status: Full Code    HCDM (patient stated preference): Nizar, Cutler - Father - [phone]    HCDM (patient stated preference): Fawaz, Borquez - Mother - [phone]  I discussed and confirmed Code Status with patient or HCDM    Date of service: 03/31/2022  Hospital Day:  LOS: 18 days        HPI     Austin Banks is a 31 y.o. male  with PMH of GAD 45 associated focal onset epilepsy on monthly IVIG, polysubstance use (currently marijuana and tobacco; remote cocaine use) schizophrenia, depression and anxiety who initially presented to [facility] ED on 4/18 with two recent seizures along with gait imbalance and slurred speech following a head injury. The patient was admitted to the Neurology floor service for further workup. The patient was placed on EEG monitoring but was negative for seizures. The patient was also noted to have an increase in GAD 65 levels to 163 (from 40.4 in 2021). Given this increase, PLEX treatment was planned. However, since admission, the patient has been increasingly agitated and would not be compliant with PLEX treatment. The patient is now being admitted to the NSICU for possible conscious sedation for catheter placement and subsequent PLEX treatment.     Assessment and Plan     Austin Banks is a 31 y.o. male admitted to NSICU with a diagnosis of GAD 62 associated seizures and encephalopathy    24 hour events:  Tolerated PLEX well with premedication    Neuro:  Mobility goal: 2 (HOB elevation)    **Seizures  - known hx of seizures; recent increase in frequency  - seizure semiology (per neurology notes from previous admissions): Begins with right eye deviation, left facial clonic jerking, eye blinking, mumbling. LUE flexed with coordinated back and forth movements of his bilateral lower extremities and drool from the left side of his mouth. All movement abruptly ceases at ictal offset with pelvic thrusting. Family also describes hand automatisms after shaking at times. Post-ictal confusion lasting up to 20 minutes. - previous EEG monitoring from negative for seizures  - - 2HELPS2B Score components: Sporadic Epileptiform Discharges = 1, Brief Ictal Appearing Rhythmic Discharges (BIRDs) = 2  and Prior Clinical Seizures or Hx of Epilepsy = 1  Total 2HELPS2B Seizure Risk Score: 4  - No seizures captured on EEG  - continue cenobamate 400mg  at bedtime,  lamictal 200mg  BID and valproate 500mg  qAM + 1g nightly  - 5/2 valproate level 55.3    **Encephalopathy  - likely d/t elevated GAD 65   - GAD 65 elevated at 163 (from 40.4 in 2021)  - previously received monthly IVIG treatments  - PLEX treatment (3/5 session, on 5/6) - pre-medicate with Benadryl and Lorazepam today.  If needed, may utilize prn haldol as rescue  - seizure management as above  - previously encephalopathy workup unremarkable  - ENA/ANA pending  - Possible LP for GAD 65 Ab levels, discuss with neurology    **Polysubstance abuse  - current marijuana and tobacco use, remote cocaine use  - continue thiamine, multivitamins, and folate  - monitor for signs of withdrawal    **Psychosis/Schizophrenia/Agitation  - increasingly agitated since admission to [facility]  - has required multiple psychiatric admissions in the past, most recently at [facility] Psych holding unit in 04/2021 for behavioral problems  - Psychiatry following, appreciate recs  - continue scheduled and PRN haldol 2mg  BID, benadryl 1mg  BID, and lorazepam 1mg   BID      Cardiovascular:  Admission weight: 77.1kg  Daily Weight: 78 kg (172 lb)     Hemodynamic goals:  - MAP > 65  - SBP < 180  - transition vital signs to Q4 hours unless on Precedex or during PLEX      Pulmonary    - stable on room air    **tobacco abuse  - every day smoker, 10 pack years  - place nicotine patch  - smoking cessation counseling      FEN  Body mass index is 23.33 kg/m??.    GI prophylaxis: not indicated  Last BM Date: 03/30/22  Foley catheter: No foley in place. **Nutrition  - start PO diet    **Glycemic control  - none needed    **Fluids/electrolytes  - goal euvolemia  - follow strict ins/outs  - check admission chemistry panel and supplement electrolytes as needed      Heme/ID  Current DVT prophylaxis: SCDs, Lovenox 40 mg daily    - start chemical DVT prophylaxis given high risk    Labs on PLEX days    Current Access:         -  PIV x 2       - Arterial line: No arterial line present.       - Central venous line: Central line required for PLEX.    Tubes and drains:  Patient Lines/Drains/Airways Status     Active Active Lines, Drains, & Airways     Name Placement date Placement time Site Days    Peripheral IV 03/26/22 Posterior;Right Forearm 03/26/22  1835  Forearm  4                       Objective Data     All vital signs and resulted laboratory studies for the past 24 hours have been reviewed.  All ordered medications have been reviewed.    Temp:  [36.5 ??C (97.7 ??F)-36.8 ??C (98.3 ??F)] 36.5 ??C (97.7 ??F)  Heart Rate:  [72-99] 85  SpO2 Pulse:  [77-97] 85  Resp:  [14-26] 21  BP: (87-138)/(35-97) 123/60  MAP (mmHg):  [49-105] 79  SpO2:  [97 %-100 %] 100 %    Intake/Output Summary (Last 24 hours) at 03/31/2022 0705  Last data filed at 03/31/2022 0449  Gross per 24 hour   Intake 2368.58 ml   Output 1950 ml   Net 418.58 ml        Physical Exam          General Exam:  General: Lying in bed. Appears comfortable. No obvious distress.   ENT:  Mucous membranes moist. Oropharynx clear.  Cardiovascular:  Regular rate and rhythm.  No murmurs.   2+ radial pulses bilaterally.    Respiratory: Breathing is comfortable and unlabored.  Lungs are clear to ausculation bilaterally.    Gastrointestinal: Soft, nontender, nondistended.  Extremities: Warm and well-perfused. No cyanosis, clubbing, or edema.  Skin: No obvious rashes or ecchymoses.    Neurological Exam:  Mental Status  LOC: awake and alert  Orientation: Oriented x 4  Neglect: not present  Speech: normal  Language: UTA    Cranial Nerves  Pupils: Does not let you evaluate  Gaze: Resting nystagmus  Face Motor: normal and symmetric  Cough: strong  Left facial twitching    Motor:   RUE: spontaneous and 5/5  LUE: spontaneous and 5/5  RLE: spontaneous and 5/5  LLE: spontaneous and 5/5  Sensory:    Sensory: UTA    Glasgow Coma Score  Motor: localizing to pain = 5  Verbal: confused = 4  Eyes: eyes open spontaneously = 4        DISPOSITION     The patient requires admission to the [facility] for management of the above conditions.    Estimated Transfer Date:  5/6        BILLING

## 2022-03-31 NOTE — Unmapped (Shared)
Neurocritical Care   Supplementary Progress Note        ASSESSMENT / PLAN     Austin Banks is a 31 y.o. male admitted to NSICU with a diagnosis of GAD 9 with associated seizures and encephalopathy.  Please see today's progress note for full details.  Ongoing management of the active conditions and plan is below.    **Seizures  - known hx of seizures; recent increase in frequency  - seizure semiology (per neurology notes from previous admissions): Begins with right eye deviation, left facial clonic jerking, eye blinking, mumbling. LUE flexed with coordinated back and forth movements of his bilateral lower extremities and drool from the left side of his mouth. All movement abruptly ceases at ictal offset with pelvic thrusting. Family also describes hand automatisms after shaking at times. Post-ictal confusion lasting up to 20 minutes. - previous EEG monitoring from negative for seizures  - No seizures captured on EEG  - continue cenobamate 400mg  at bedtime,  lamictal 200mg  BID and valproate 500mg  qAM + 1g nightly  - valproate levels WNL (60.6)     **Encephalopathy  - likely d/t elevated GAD 65   - GAD 65 elevated at 163 (from 40.4 in 2021) -> possible LP, discuss w/ neurology in AM  - previously received monthly IVIG treatments  - PLEX treatment (3/5 session, on 5/6) - pre-medicate with Benadryl and Lorazepam today.  If needed, may utilize PRN haldol as rescue  - ENA/ANA; pending***     **Psychosis/Schizophrenia/Agitation  - increasingly agitated since admission to [facility]  - has required multiple psychiatric admissions in the past, most recently at [facility] Psych holding unit in 04/2021 for behavioral problems  - continue scheduled and PRN haldol 2mg  BID, benadryl 12.5 mg BID, and lorazepam 1mg  BID  - psychiatry following; appreciate recs     OBJECTIVE DATA       Vitals Reviewed:   Temp:  [36.5 ??C (97.7 ??F)-36.8 ??C (98.3 ??F)] 36.5 ??C (97.7 ??F)  Heart Rate:  [67-99] 77  SpO2 Pulse:  [68-97] 77  Resp:  [14-26] 23  BP: (87-138)/(35-97) 129/74  MAP (mmHg):  [49-105] 90  SpO2:  [97 %-100 %] 100 %  Temp (24hrs), Avg:36.7 ??C (98 ??F), Min:36.5 ??C (97.7 ??F), Max:36.8 ??C (98.3 ??F)    SpO2: 100 %  Height: 182.9 cm (6')   Weight: 78 kg (172 lb)   Body mass index is 23.33 kg/m??.    Body surface area is 1.99 meters squared.     Continuous Infusions:      Ventilator settings:           NEURO EXAM      Mental status:   LOC: Awake and alert  Orientation: person, place, time  Speech: Normal  Language: UTA    Cranial nerves:   Pupils: doesn't let you evaluate  Corneal reflex: UTA  Gaze:  resting nystagmus  Face sensory: UTA  Face motor: normal, symmetric  Vestibular:  resting nystagmus  Cough: normal  Gag: UTA  Tongue: UTA,     Motor:   RUE: spontaneous and 5/5  LUE: spontaneous and 5/5  RLE: spontaneous and 5/5  LLE: spontaneous and 5/5     Sensory: UTA       BILLING     This patient is critically ill or injured with the impairment of vital organ systems such that there is a high probability of imminent or life threatening deterioration in the patient's condition. The reason the patient is critically ill and  the nature of the treatment and management provided to manage the critically ill patient is as listed in above note.   I directly provided *** minutes of critical care time as documented in this note. Time includes: direct patient care, patient reassessment, coordination of patient care, interpretation of data, review of patient medical records, and documentation of patient care. This time is exclusive of separately billable procedures.    Scribe Statement:   Documentation assistance was provided by me personally, Otis Burress, a scribe. Ellouise Newer, ACNP, obtained and performed the history, physical exam and medical decision making elements that were entered into the chart. Signed by Harrell Gave, 03/31/22 at 12:09 AM     Provider Attestation:   Documentation assistance was provided by Harrell Gave, a scribe.  Bernerd Limbo, ACNP, was present during the time the encounter was recorded. The information recorded by the Scribe was done at my direction and has been reviewed and validated by me.

## 2022-03-31 NOTE — Unmapped (Signed)
Pt pulled his vas cath with restrained on, PNA at bedside. Pt was verbally and physically impulsive at times.  LIP notified. Keep SBP<180 and MAP>65 with no intervention needed. Pt finished the whole meal tray at 2100. Void in urinal adequately.    Problem: Adult Inpatient Plan of Care  Goal: Plan of Care Review  Outcome: Ongoing - Unchanged  Goal: Patient-Specific Goal (Individualized)  Outcome: Ongoing - Unchanged  Goal: Absence of Hospital-Acquired Illness or Injury  Outcome: Ongoing - Unchanged  Intervention: Identify and Manage Fall Risk  Recent Flowsheet Documentation  Taken 03/30/2022 2000 by Drue Dun, RN  Safety Interventions:   aspiration precautions   bleeding precautions   sitter at bedside   low bed  Intervention: Prevent and Manage VTE (Venous Thromboembolism) Risk  Recent Flowsheet Documentation  Taken 03/30/2022 2000 by Drue Dun, RN  Activity Management:   bedrest   activity adjusted per tolerance  Range of Motion: Bilateral Upper and Lower Extremities  Intervention: Prevent Infection  Recent Flowsheet Documentation  Taken 03/30/2022 2000 by Drue Dun, RN  Infection Prevention:   environmental surveillance performed   equipment surfaces disinfected   hand hygiene promoted   rest/sleep promoted  Goal: Optimal Comfort and Wellbeing  Outcome: Ongoing - Unchanged  Goal: Readiness for Transition of Care  Outcome: Ongoing - Unchanged  Goal: Rounds/Family Conference  Outcome: Ongoing - Unchanged

## 2022-04-01 IMAGING — CT CT HEAD W/O CM
3 series · 15 of 47 positions shown, 18 images · non-contrast
Comparison: Noncontrast head CT 11/18/2019, brain MRI 08/08/2019

CLINICAL DATA: Breakthrough seizure. Additional history provided:
Witnessed fall.

EXAM:
CT HEAD WITHOUT CONTRAST
TECHNIQUE: Contiguous axial images were obtained from the base of the skull
through the vertex without intravenous contrast.

[Series 2: head wo · axial · 0.45mm/px · z∈[+112,+237]mm · 9 of 30 slices shown, 12 images]
[im 3/30  brain]
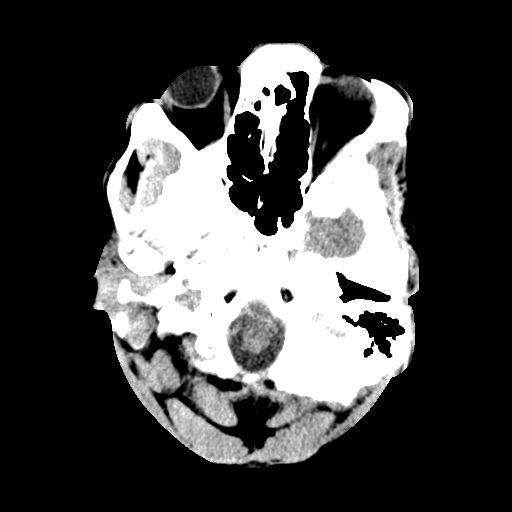
[im 3/30  bone]
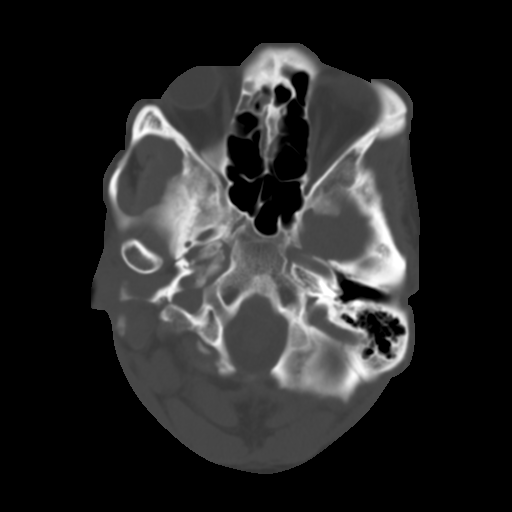
[im 6/30  brain]
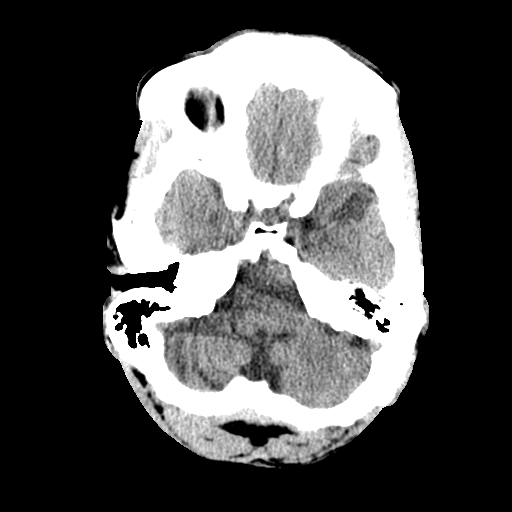
[im 9/30  brain]
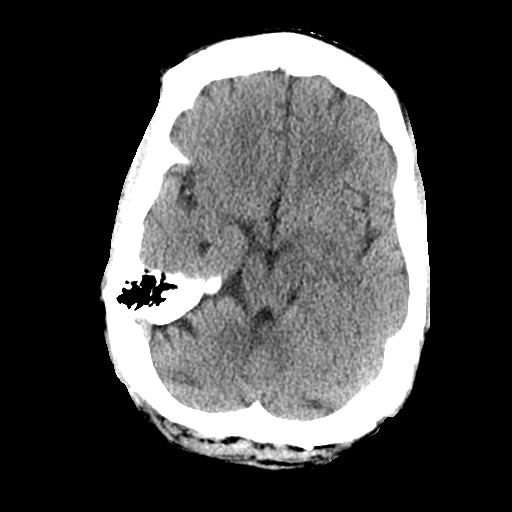
[im 12/30  brain]
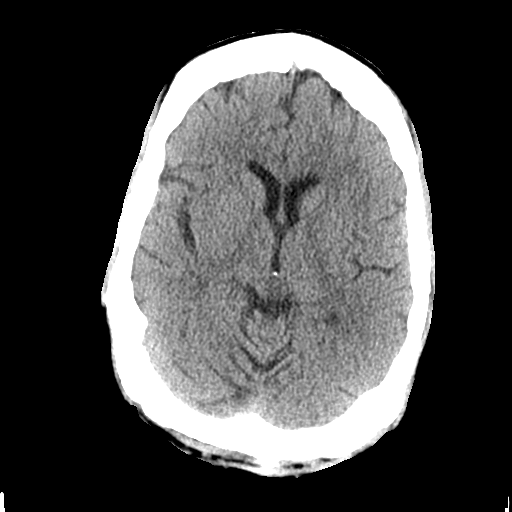
[im 16/30  brain]
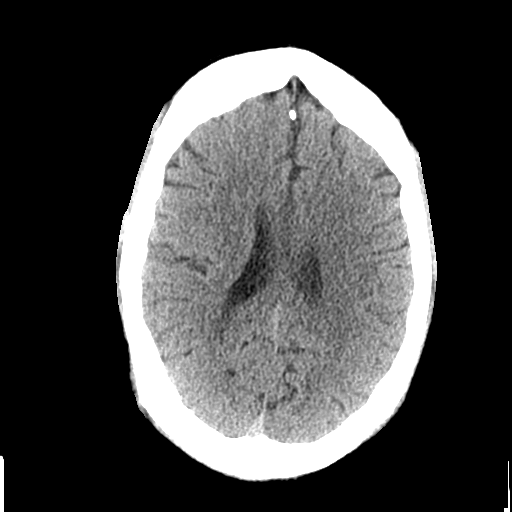
[im 16/30  bone]
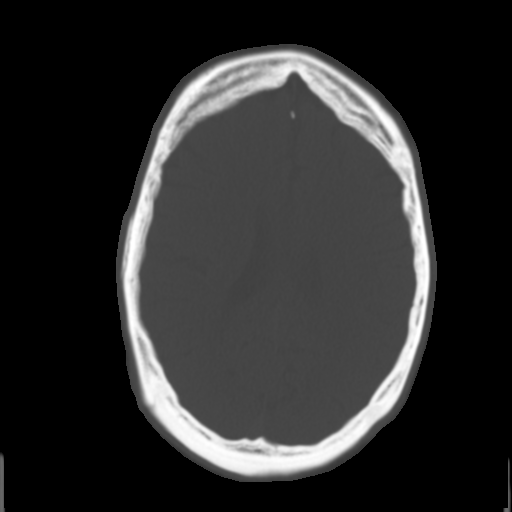
[im 19/30  brain]
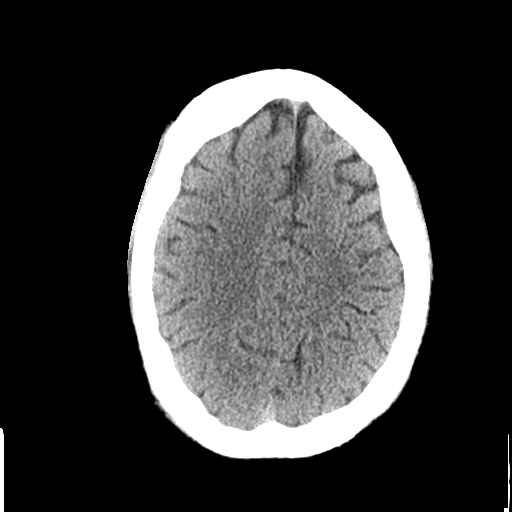
[im 22/30  brain]
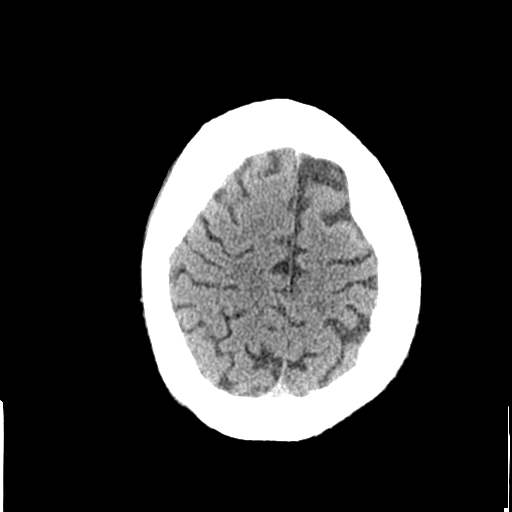
[im 25/30  brain]
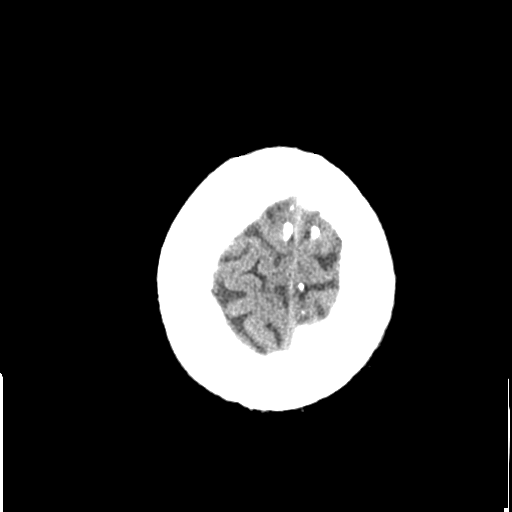
[im 28/30  brain]
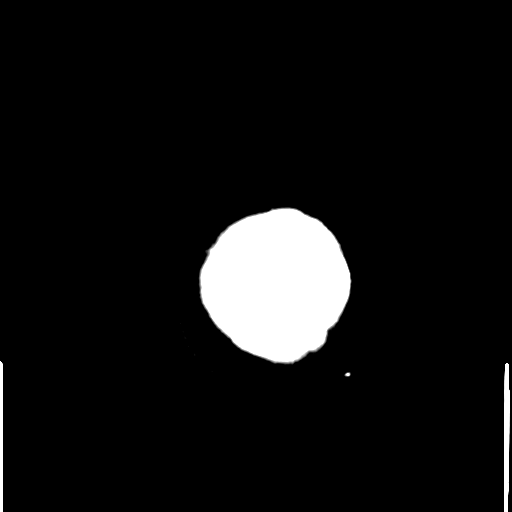
[im 28/30  bone]
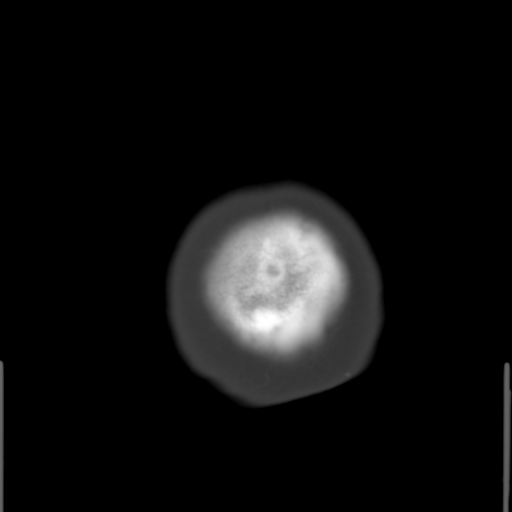

[Series 4: coronal soft tissue · coronal · 0.33mm/px · 3 of 72 slices shown]
[im 24/72  brain]
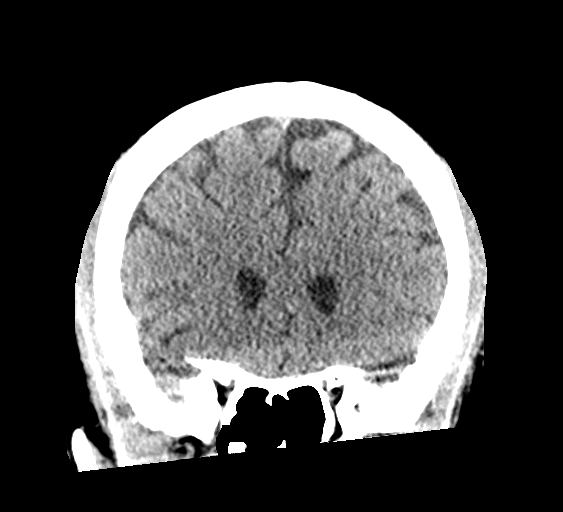
[im 32/72  brain]
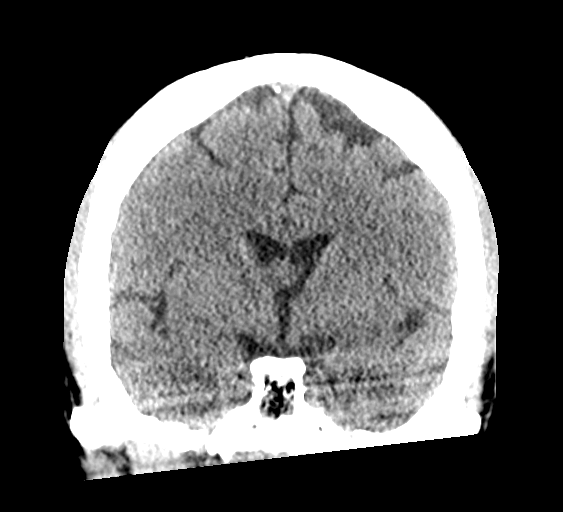
[im 40/72  brain]
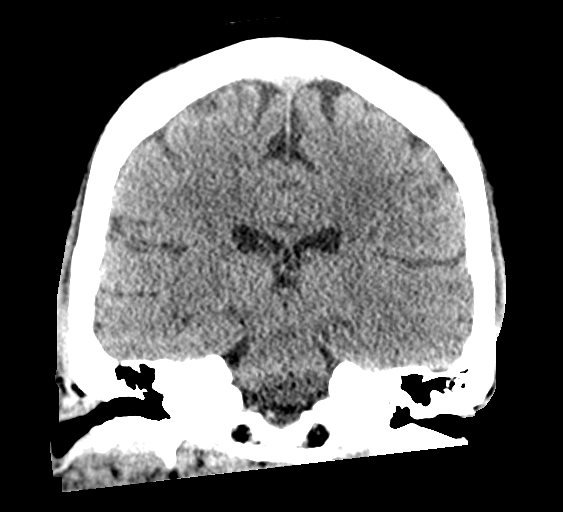

[Series 5: sagittal soft tissue · sagittal · 0.33mm/px · 3 of 53 slices shown]
[im 18/53  brain]
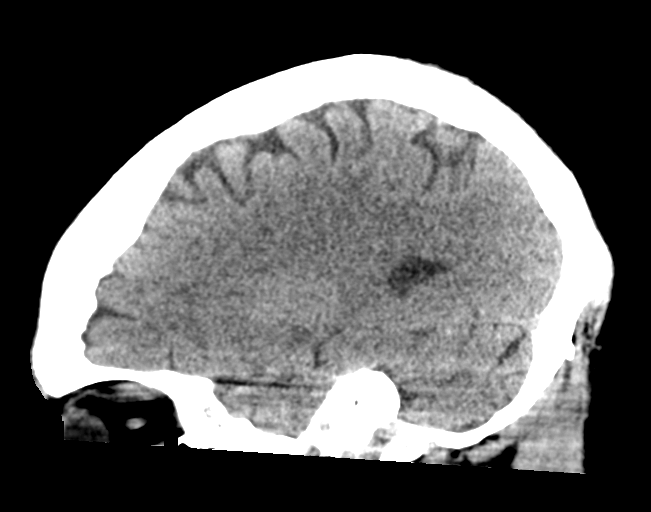
[im 27/53  brain]
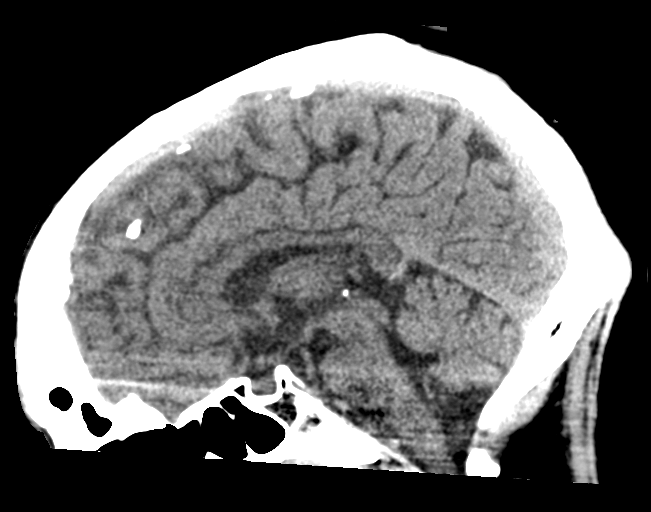
[im 35/53  brain]
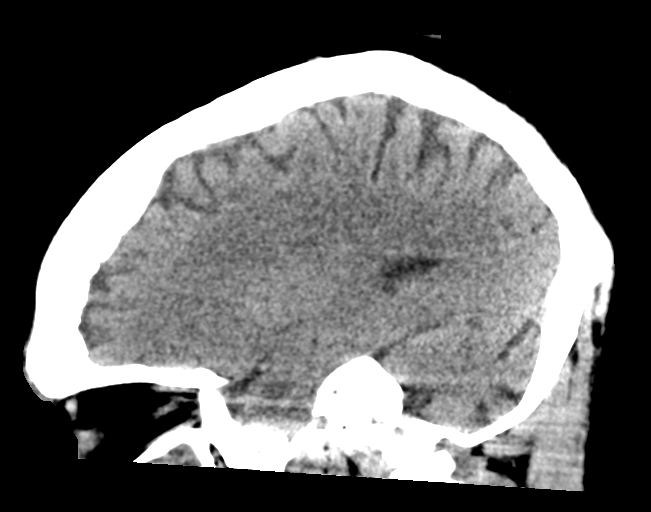

[15 of 47 positions shown; findings below may reference images not displayed]

FINDINGS: Brain:

There is no acute intracranial hemorrhage.

No demarcated cortical infarct.

No extra-axial fluid collection.

No evidence of intracranial mass.

No midline shift.

Vascular: No hyperdense vessel.

Skull: Normal. Negative for fracture or focal lesion.

Sinuses/Orbits: Visualized orbits show no acute finding. Complete
opacification of the right frontal sinus. Extensive partial
opacification of anterior right ethmoid air cells. Complete
opacification of the visualized right maxillary sinus. No
significant mastoid effusion.
IMPRESSION: No evidence of acute intracranial abnormality.

Redemonstrated right ostiomeatal complex obstructive pattern
paranasal sinus disease.

## 2022-04-01 MED ADMIN — polyethylene glycol (MIRALAX) packet 17 g: 17 g | ORAL | @ 14:00:00

## 2022-04-01 MED ADMIN — oxyCODONE (ROXICODONE) immediate release tablet 5 mg: 5 mg | ORAL | @ 16:00:00 | Stop: 2022-04-03

## 2022-04-01 MED ADMIN — heparin, porcine (PF) 100 unit/mL injection 200 Units: 200 [IU] | INTRAVENOUS | @ 18:00:00

## 2022-04-01 MED ADMIN — divalproex ER (DEPAKOTE ER) extended released 24 hr tablet 1,000 mg: 1000 mg | ORAL | @ 23:00:00

## 2022-04-01 MED ADMIN — **PATIENT SUPPLIED** cenobamate 200 mg tablet: 2 | ORAL

## 2022-04-01 MED ADMIN — folic acid (FOLVITE) tablet 1 mg: 1 mg | ORAL | @ 14:00:00

## 2022-04-01 MED ADMIN — enoxaparin (LOVENOX) syringe 40 mg: 40 mg | SUBCUTANEOUS

## 2022-04-01 MED ADMIN — multivitamins, therapeutic with minerals tablet 1 tablet: 1 | ORAL | @ 14:00:00

## 2022-04-01 MED ADMIN — acetaminophen (TYLENOL) tablet 650 mg: 650 mg | ORAL | @ 19:00:00

## 2022-04-01 MED ADMIN — melatonin tablet 3 mg: 3 mg | ORAL | @ 23:00:00

## 2022-04-01 MED ADMIN — dexmedetomidine 400 mcg in sodium chloride 0.9% 100 ml (4 mcg/mL) infusion PMB: INTRAVENOUS | @ 17:00:00

## 2022-04-01 MED ADMIN — acetaminophen (TYLENOL) tablet 650 mg: 650 mg | ORAL | @ 09:00:00

## 2022-04-01 MED ADMIN — diphenhydrAMINE (BENADRYL) injection: 12.5 mg | INTRAVENOUS | @ 14:00:00

## 2022-04-01 MED ADMIN — haloperidol lactate (HALDOL) injection 2 mg: 2 mg | INTRAVENOUS | @ 23:00:00

## 2022-04-01 MED ADMIN — haloperidol lactate (HALDOL) injection 2 mg: 2 mg | INTRAVENOUS | @ 14:00:00

## 2022-04-01 MED ADMIN — senna (SENOKOT) tablet 2 tablet: 2 | ORAL | Stop: 2022-04-11

## 2022-04-01 MED ADMIN — lamoTRIgine (LaMICtal) tablet 200 mg: 200 mg | ORAL | @ 14:00:00

## 2022-04-01 MED ADMIN — sodium chloride 0.9% (NS) bolus 500 mL: 500 mL | INTRAVENOUS | @ 09:00:00 | Stop: 2022-04-01

## 2022-04-01 MED ADMIN — lamoTRIgine (LaMICtal) tablet 200 mg: 200 mg | ORAL | @ 23:00:00

## 2022-04-01 MED ADMIN — LORazepam (ATIVAN) injection 1 mg: 1 mg | INTRAVENOUS | @ 14:00:00

## 2022-04-01 MED ADMIN — oxyCODONE (ROXICODONE) immediate release tablet 5 mg: 5 mg | ORAL | @ 21:00:00 | Stop: 2022-04-03

## 2022-04-01 MED ADMIN — oxyCODONE (ROXICODONE) immediate release tablet 5 mg: 5 mg | ORAL | @ 02:00:00 | Stop: 2022-04-03

## 2022-04-01 MED ADMIN — diphenhydrAMINE (BENADRYL) injection: 12.5 mg | INTRAVENOUS | @ 23:00:00

## 2022-04-01 MED ADMIN — melatonin tablet 3 mg: 3 mg | ORAL

## 2022-04-01 MED ADMIN — divalproex ER (DEPAKOTE ER) extended released 24 hr tablet 500 mg: 500 mg | ORAL | @ 13:00:00

## 2022-04-01 MED ADMIN — propranoloL (INDERAL) tablet 20 mg: 20 mg | ORAL

## 2022-04-01 MED ADMIN — LORazepam (ATIVAN) injection 1 mg: 1 mg | INTRAVENOUS | @ 23:00:00

## 2022-04-01 MED ADMIN — thiamine mononitrate (vit B1) tablet 100 mg: 100 mg | ORAL | @ 14:00:00

## 2022-04-01 MED ADMIN — propranoloL (INDERAL) tablet 20 mg: 20 mg | ORAL | @ 14:00:00

## 2022-04-01 NOTE — Unmapped (Signed)
Neurocritical Care Admission Note     CODE STATUS:    Code Status: Full Code    HCDM (patient stated preference): [place], [place] - Father - [phone]    HCDM (patient stated preference): [place], [place] - Mother - [phone]  I discussed and confirmed Code Status with patient or HCDM    Date of service: 04/01/2022  Hospital Day:  LOS: 19 days        HPI     Austin Banks is a 31 y.o. 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 The patient was admitted to the Neurology floor service for further workup. The patient was placed on EEG monitoring but was negative for seizures. The patient was also noted to have an increase in GAD 65 levels to 163 (from 40.4 in 2021). Given this increase, PLEX treatment was planned. However, since admission, the patient has been increasingly agitated and would not be compliant with PLEX treatment. The patient is now being admitted to the NSICU for possible conscious sedation for catheter placement and subsequent PLEX treatment.     Assessment and Plan     Chandler Swiderski is a 31 y.o. male admitted to NSICU with a diagnosis of GAD 26 associated seizures and encephalopathy    24 hour events:  Asking for cigarette today, accusing others of being able to smoke in the hospital    Neuro:  Mobility goal: 2 (HOB elevation)    **Seizures  - known hx of seizures; recent increase in frequency  - seizure semiology (per neurology notes from previous admissions): Begins with right eye deviation, left facial clonic jerking, eye blinking, mumbling. LUE flexed with coordinated back and forth movements of his bilateral lower extremities and drool from the left side of his mouth. All movement abruptly ceases at ictal offset with pelvic thrusting. Family also describes hand automatisms after shaking at times. Post-ictal confusion lasting up to 20 minutes. 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  If needed, may utilize prn haldol as rescue  - seizure management as above  - previously encephalopathy workup unremarkable  - ENA/ANA pending  - Replace PLEX catheter today, will use precedex.  Has been previously consented for conscious sedation but was not needed prior    **Polysubstance abuse  - current marijuana and tobacco use, remote cocaine use  - continue thiamine, multivitamins, and folate  - monitor for signs of withdrawal    **Psychosis/Schizophrenia/Agitation  - increasingly agitated since admission to [facility]  - has required multiple psychiatric admissions in the past, most recently at [facility] holding unit in 04/2021 for behavioral problems  -  Psychiatry following, appreciate recs  - continue scheduled and PRN haldol 2mg  BID, benadryl 1mg  BID, and lorazepam 1mg  BID  - propanolol 20mg  BID  - Has not required PRN since the 4th ( except during PLEX)    Cardiovascular:  Admission weight: 77.1kg  Daily Weight: 77.6 kg (171 lb 1.2 oz)     Hemodynamic goals:  - MAP > 65  - SBP < 180  - transition vital signs to Q4 hours unless on Precedex or during PLEX    Pulmonary    - stable on room air    **tobacco abuse  - every day smoker, 10 pack years  - refusing nicotine patch  - smoking cessation counseling      FEN  Body mass index is 23.2 kg/m??.    GI prophylaxis: not indicated  Last BM Date: 03/30/22  Foley catheter: No foley in place.     **Nutrition  - start PO diet    **Glycemic control  - none needed    **Fluids/electrolytes  - goal euvolemia  - follow strict ins/outs  - check admission chemistry panel and supplement electrolytes as needed    Heme/ID  Current DVT prophylaxis: SCDs, Lovenox 40 mg daily    - start chemical DVT prophylaxis given high risk    Labs on PLEX days    Current Access:         -  PIV x 2       - Arterial line: No arterial line present.       - Central venous line: Central line required for PLEX.    Tubes and drains:  Patient Lines/Drains/Airways Status       Active Active Lines, Drains, & Airways       Name Placement date Placement time Site Days    Peripheral IV 03/31/22 Anterior;Left Forearm 03/31/22  1800  Forearm  less than 1                           Objective Data     All vital signs and resulted laboratory studies for the past 24 hours have been reviewed.  All ordered medications have been reviewed.    Temp:  [36.9 ??C (98.4 ??F)-37.5 ??C (99.5 ??F)] 37 ??C (98.6 ??F)  Heart Rate:  [64-96] 67  SpO2 Pulse:  [66-95] 66  Resp:  [14-24] 21  BP: (95-129)/(23-72) 102/40  MAP (mmHg):  [52-84] 62  SpO2:  [97 %-100 %] 98 %    Intake/Output Summary (Last 24 hours) at 04/01/2022 0737  Last data filed at 04/01/2022 0600  Gross per 24 hour   Intake 1160 ml   Output 1225 ml   Net -65 ml          Physical Exam          General Exam:  General: Lying in bed. Appears comfortable. No obvious distress.   ENT:  Mucous membranes moist. Oropharynx clear.  Cardiovascular:  Regular rate and rhythm.  No murmurs.   2+ radial pulses bilaterally.    Respiratory: Breathing is comfortable and unlabored.  Lungs are clear to ausculation bilaterally.    Gastrointestinal: Soft, nontender, nondistended.  Extremities: Warm and well-perfused. No cyanosis, clubbing, or edema.  Skin: No obvious rashes or ecchymoses.    Neurological Exam:  Mental Status  LOC: awake and alert  Orientation: Oriented x 4  Neglect: not present  Speech: normal  Language: UTA    Cranial Nerves  Pupils: Does  not let you evaluate  Gaze: Resting nystagmus  Face Motor: normal and symmetric  Cough: strong  Left facial twitching    Motor:   RUE: spontaneous and 5/5  LUE: spontaneous and 5/5  RLE: spontaneous and 5/5  LLE: spontaneous and 5/5     Sensory:    Sensory: UTA    Glasgow Coma Score  Motor: obeys commands = 6  Verbal: oriented = 5  Eyes: eyes open spontaneously = 4        DISPOSITION     The patient requires admission to the [facility] for management of the above conditions.    Estimated Transfer Date:  5/8 PLEX catheter placement        BILLING     This is a subsequent evaluation of the patient.    Terri Skains, MD  Neurocritical Care Attending

## 2022-04-01 NOTE — Unmapped (Signed)
Neuro: alert and oriented x4. + c/c. Gag deferred Moves all 4 extremities to command with full strength and full sensation.   CV: NSR on the monitor. SBP goal <180, MAP > 65. 500 bolus NS for hypotension    Resp: Lungs clear bilaterally. Room air  Heme: SCDs in place bilaterally.  GI: Regular oral diet.   GU/Renal: Voiding with adequate output.  Skin: turns self.  Lines: 1 PIV.  Pain: oxy and tylenol given once this shift.    Family updated on POC. Will CTM closely and update ICU team as needed.   Problem: Adult Inpatient Plan of Care  Goal: Plan of Care Review  Outcome: Ongoing - Unchanged  Goal: Patient-Specific Goal (Individualized)  Outcome: Ongoing - Unchanged  Goal: Absence of Hospital-Acquired Illness or Injury  Outcome: Ongoing - Unchanged  Intervention: Identify and Manage Fall Risk  Recent Flowsheet Documentation  Taken 03/31/2022 2000 by Rosemarie Ax, RN  Safety Interventions:   enteral feeding safety   lighting adjusted for tasks/safety   low bed  Intervention: Prevent Skin Injury  Recent Flowsheet Documentation  Taken 03/31/2022 2000 by Rosemarie Ax, RN  Skin Protection:   adhesive use limited   hydrocolloids used   incontinence pads utilized   tubing/devices free from skin contact  Intervention: Prevent and Manage VTE (Venous Thromboembolism) Risk  Recent Flowsheet Documentation  Taken 03/31/2022 2000 by Rosemarie Ax, RN  Activity Management: activity adjusted per tolerance  VTE Prevention/Management:   anticoagulant therapy   intravenous hydration  Range of Motion: Bilateral Upper and Lower Extremities  Goal: Optimal Comfort and Wellbeing  Outcome: Ongoing - Unchanged  Goal: Readiness for Transition of Care  Outcome: Ongoing - Unchanged  Goal: Rounds/Family Conference  Outcome: Ongoing - Unchanged     Problem: Seizure Disorder Comorbidity  Goal: Maintenance of Seizure Control  Outcome: Ongoing - Unchanged  Intervention: Maintain Seizure-Symptom Control  Recent Flowsheet Documentation  Taken 03/31/2022 2000 by Rosemarie Ax, RN  Seizure Precautions:   activity supervised   clutter-free environment maintained   emergency equipment at bedside   enclosure bed used   side rails padded   soft boundaries provided     Problem: Self-Care Deficit  Goal: Improved Ability to Complete Activities of Daily Living  Outcome: Ongoing - Unchanged     Problem: Violence Risk or Actual  Goal: Anger and Impulse Control  Outcome: Ongoing - Unchanged     Problem: VTE (Venous Thromboembolism)  Goal: VTE (Venous Thromboembolism) Symptom Resolution  Outcome: Ongoing - Unchanged  Intervention: Prevent or Manage VTE (Venous Thromboembolism)  Recent Flowsheet Documentation  Taken 04/01/2022 0400 by Rosemarie Ax, RN  Anti-Embolism Device Type: SCD, Knee  Anti-Embolism Intervention: On  Anti-Embolism Device Location: BLE  Taken 04/01/2022 0200 by Rosemarie Ax, RN  Anti-Embolism Device Type: SCD, Knee  Anti-Embolism Intervention: On  Anti-Embolism Device Location: BLE  Taken 04/01/2022 0000 by Rosemarie Ax, RN  Anti-Embolism Device Type: SCD, Knee  Anti-Embolism Intervention: On  Anti-Embolism Device Location: BLE  Taken 03/31/2022 2200 by Rosemarie Ax, RN  Anti-Embolism Device Type: SCD, Knee  Anti-Embolism Intervention: On  Anti-Embolism Device Location: BLE  Taken 03/31/2022 2000 by Rosemarie Ax, RN  Bleeding Precautions:   blood pressure closely monitored   gentle oral care promoted  VTE Prevention/Management:   anticoagulant therapy   intravenous hydration  Anti-Embolism Device Type: SCD, Knee  Anti-Embolism Intervention: On  Anti-Embolism Device Location: BLE     Problem: Seizure, Active Management  Goal: Absence of Seizure/Seizure-Related Injury  Outcome: Ongoing -  Unchanged  Intervention: Prevent Seizure-Related Injury  Recent Flowsheet Documentation  Taken 03/31/2022 2000 by Rosemarie Ax, RN  Aspiration Prevention During Seizure: positioned on side during seizure  Seizure Precautions:   activity supervised   clutter-free environment maintained emergency equipment at bedside   enclosure bed used   side rails padded   soft boundaries provided     Problem: Adjustment to Illness (Delirium)  Goal: Optimal Coping  Outcome: Ongoing - Unchanged     Problem: Altered Behavior (Delirium)  Goal: Improved Behavioral Control  Outcome: Ongoing - Unchanged     Problem: Attention and Thought Clarity Impairment (Delirium)  Goal: Improved Attention and Thought Clarity  Outcome: Ongoing - Unchanged  Intervention: Maximize Cognitive Function  Recent Flowsheet Documentation  Taken 03/31/2022 2000 by Rosemarie Ax, RN  Reorientation Measures: clock in view     Problem: Sleep Disturbance (Delirium)  Goal: Improved Sleep  Outcome: Ongoing - Unchanged     Problem: Fall Injury Risk  Goal: Absence of Fall and Fall-Related Injury  Outcome: Ongoing - Unchanged  Intervention: Promote Injury-Free Environment  Recent Flowsheet Documentation  Taken 03/31/2022 2000 by Rosemarie Ax, RN  Safety Interventions:   enteral feeding safety   lighting adjusted for tasks/safety   low bed     Problem: Skin Injury Risk Increased  Goal: Skin Health and Integrity  Outcome: Ongoing - Unchanged  Intervention: Optimize Skin Protection  Recent Flowsheet Documentation  Taken 04/01/2022 0400 by Rosemarie Ax, RN  Head of Bed [facility]) Positioning: HOB at 30-45 degrees  Taken 04/01/2022 0000 by Rosemarie Ax, RN  Head of Bed [facility]) Positioning: HOB at 30-45 degrees  Taken 03/31/2022 2000 by Rosemarie Ax, RN  Pressure Reduction Techniques:   frequent weight shift encouraged   weight shift assistance provided  Head of Bed (HOB) Positioning: HOB at 30-45 degrees  Pressure Reduction Devices:   positioning supports utilized   pressure-redistributing mattress utilized   specialty bed utilized  Skin Protection:   adhesive use limited   hydrocolloids used   incontinence pads utilized   tubing/devices free from skin contact

## 2022-04-01 NOTE — Unmapped (Signed)
Neurology Inpatient Team B (NMB)  Daily Progress Note       Patient: Austin Banks  Code Status: Full Code  Level of Care: ICU status.   LOS: 19 days      Overnight Events & Subjective:     - Patient fixated on someone smoking in the hallway. Denies any pain.   - Required some IVFs last night due to MAPs in the 50s.  - Plan for PLEX catheter placement today, has two remaining sessions.       Physical Exam:     General Exam:   General Appearance: Sitting in chair comfortably.   Lungs: Normal work of breathing.    Extremities: No clubbing or cyanosis.     Neurological Exam:   Mental Status:   Alert, calm. At baseline has mild dysarthria. Answers most questions appropriately. Will perseverate on things. For example, today he was fixated on someone smoking in the hallway.      Cranial Nerves:   Mild L facial droop at rest (baseline).   EOM grossly intact.      Sensory exam:   Not done today. Previously light touch has been normal in the bilateral upper extremities and bilateral lower extremities.     Motor Exam:   Normal bulk   Moving all extremities spontaneously and equally.     Reflexes:   Deferred. Previously 1+ throughout bilaterally, Hoffmann's absent bilaterally, Toes down going bilaterally    Cerebellar/Coordination/Gai:  Not assessed       Assessment/Plan:     Assessment: Austin Banks is a 31 y.o. 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      # Dysarthria - AMS - Imbalance - Difficulty Ambulating: Patient presented with 2 weeks of worsening balance and fluctuating mental status in the setting of recent medication changes, frequent marijuana use and falls associated with seizure and subsequent head injury. His symptoms are likely medication and drug related given the multiple CNS sedating meds he requires for seizure control with concurrent drug use. Other than the dysarthria which has improved and the multi-directional nystagmus, there are no other focal neurological findings on exam. With his history of medically refractory epilepsy and questionable compliance, cannot rule out subclinical events contributing to his fluctuating mental status as well, but EEG during admission has not revealed any seizures.    # GAD 65 c/b focal to bilateral tonic clonic seizures: Medically refractory, currently undergoing DBS vs VNS candidacy. Given one dose on IV VPA 500mg  and placed on IV maintenance as we await NG tube placement vs PO clearance. Patient endorses compliance with ASMs, however, he spends half the week with friends per father, partying and smoking marijuana and so compliance is questionable. He has had subtherapeutic levels of Lamictal in the past. Valproic acid was obtained on admission but collected after he received 500mg  IV.     Workup:  Resulted:  - CTH: No acute abnormalities, On personal review, patient has atrophy greater than expected for age cerebellum>cerebral cortex  - CT C/A/P: No abnormal lesions/masses  - CT cervical spine: No fracture or traumatic malalignment of the cervical spine.  - UA unremarkable  - VPA level 74.0  - Vitamin B12 wnl, RPR nonreactive  - Alcohol level (<10.0 4/19pm)  - Utox (+cannabinoids + benzos)  -  cvEEG (03/14/22 0430 to 03/15/22 11:30 AM ): There were bilateral independent (L>>R) temporal epileptiform discharges indicating focal cortical irritation and increased potential for seizure activity over there regions. There was moderate encephalopathy which is non-specific in etiology. There are no seizures. EEG discontinued on 4/20; restarted on 5/2 x2 days with bitemporal sharp waves and R>L temporal slowing.  - HIV negative  - lamotrigine level 6.8 (4/20)  - clobazam level 50 (4/19)  - B1 level 113 (4/19)  - GAD 65 antibody elevated 163 (03/14/22)   - previously 40.4 in June 2021  - MRI brain with and without contrast done 5/3: R > L cerebellar atrophy disproportionate for age. No abnormal enhancement  - ANA negative; ENA pending    Plan:  - Continue home lamictal 200mg  BID  - Continue IV valproate to 500mg  qAM + 1000mg  nightly at 6pm for seizure and behavioral management   - Continue haldol 2mg  bid standing dose, benadryl 12.5 mg bid standing dose, lorazepam 1mg  bid standing dose   - Continue cenobamate 400mg  at bedtime   - Discontinued onfi due to oversedation  - Discontinued Fycompa 2 mg at bedtime (s4/20, dc 4/27) given literature reports of increased agitation  - Received rituximab 1g IV (4/24)  - Replace PLEX catheter on 5/7, may require some IV sedation  - Plan for PLEX x5 inpatient (s5/2, 5/4, 5/5) followed by maintenance PLEX at 2 weeks then monthly thereafter  [ ]  Repeat GAD 65 Ab 10 days after last PLEX session    # Dysarthria - Excessive Drooling - C/f Aspiration: Patient had excessive coughing with liquid coming out his nose during bedside swallow eval and thus failed. Possibly medication related as Onfi was initiated recently (02/17/22). Father also notes dysarthria after his marijuana use. CXR suggested mild left lower lobe consolidation c/f possible aspiration PNA but lungs clear on CT chest. NG tube placed with continuous tube feeds upon admission but patient able to tolerate regular diet since 4/21.  - Nutrition consulted; appreciate recs    # Weight Loss - Poor Appetite: Patients father reports ~ 30lb weight loss over the past 6 months. Weight was 87kg in 06/2021, 77.7kg this admission. With his know GAD 65 positivity and tobacco abuse, malignancy screening with CT chest, abdomen and pelvis was obtained and unremarkable. Poor appetite could also be medication and/or tobacco use related.  - Nutrition consulted; appreciate recs     # Psychosis - Schizophrenia: IVC currently in place. Behavioral response called for increasing combativeness in attempt to elope requiring IM Ativan 2mg , followed by Haldol. Patient seems emotionally labile-appearing calm amenable to admission on initial evaluation but after his father left the bedside he became agitated, loud, refusing admission and encroaching on this examiner's personal space. Patient has required multiple psychiatric admissions in the past most recently held in our Psych holding unit in 04/2021 for behavioral problems.   -- Psychiatry consulted, appreciate recs  - IV Depakote 500mg  qAM + 1000mg  nightly (at 6pm) as above   - Depakote levels:   - 4/25 subtherapeutic 38.6   - 4/28 therapeutic at 68.1   - 4/29 subtherapeutic at 48.7              - 5/2 therapeutic at 55.3              - 5/5 therapeutic at 60.6  - Continue haldol 2mg  BID standing dose (9AM, 6PM)  - Continue Benadryl 12.5 mg BID standing dose (9AM, 6PM)  - Continue lorazepam 1mg  BID  standing dose (9AM, 6PM)  - Continue propranolol 20mg  BID in place of home propranolol XR 60mg  daily  - CONTINUE home lamictal 200 mg PO BID  - HOLD home trazodone 200 mg nightly  -- Agitation PRNs:   -- Continue Haldol 2mg  IV bid PRN   -- Continue Ativan 1mg  IV bid PRN   -- Continue Benadryl 12.5mg  IV bid PRN  -- continue thiamine PO/NGT supplementation daily   -- continue folate supplementation and multivitamin daily   -- continue telemetry monitoring    - EKG to monitor QTc (405 on 5/4)     # Polysubstance Use: Seen in [facility]. He has been referred to smoking cessation program. Patient endorses daily tobacco use and marijuana use (when visiting friends weekly). Denies recent cocaine use. Utox (+cannabinoids + benzos).  - Nicotine patch daily though patient refuses.      Discharge Planning:   - Case management: consulted. Recommendations appreciated.  - Social work: N/A  - PT: consulted, 5xL  - OT: consulted, 3x  - SLP: 3x  - Expected Discharge Disposition: TBD. Referral for [facility] placed. Expanded referrals statewide for inpatient psych admission.      Checklist:  - Diet: Regular diet  - IV fluids: none  - Bowel Regimen: senna  - GI PPX: No GI indications  - DVT PPX: Lovenox 40 mg [place] daily  - Lines/Access:   Patient Lines/Drains/Airways Status       Active Active Lines, Drains, & Airways       Name Placement date Placement time Site Days    Peripheral IV 03/31/22 Anterior;Left Forearm 03/31/22  1800  Forearm  less than 1                  - Foley: No     Patient seen and discussed with attending physician, Dr. Lanora Manis who is in agreement with the assessment and plan.      This patient is co-managed with the NSICU Service. Under this co-management agreement, the NSICU serves as the first-call team. Please page the NSICU team at [phone] for any questions/concerns.  Neurology Team B resident can be reached at [phone].    Laakea Pereira P. Alga Southall, MD  Resident Physician - PGY3  Department of Neurology       Data Review:     Contact Information:  Family contact: Kordell, Jafri (Father)   [phone] (Mobile)    PCP: [facility]    Medications:  Scheduled medications:    fentaNYL (PF)        cenobamate  2 tablet Oral At bedtime    calcium chloride        calcium chloride        diphenhydrAMINE  12.5 mg Intravenous BID    valproate sodium  1,000 mg Intravenous Nightly    Or    divalproex ER  1,000 mg Oral Nightly    valproate sodium  500 mg Intravenous Daily before breakfast    Or    divalproex ER  500 mg Oral Daily before breakfast    enoxaparin (LOVENOX) injection  40 mg Subcutaneous Q24H SCH    folic acid  1 mg Oral Daily    haloperidol lactate  2 mg Intravenous BID    And    LORazepam  1 mg Intravenous BID    heparin, porcine (PF)        lamoTRIgine  200 mg Oral BID    melatonin  3 mg Oral QPM  multivitamins, therapeutic with minerals  1 tablet Oral Daily    polyethylene glycol  17 g Oral Daily    propranoloL  20 mg Oral BID    senna  2 tablet Oral Nightly    thiamine mononitrate (vit B1)  100 mg Oral Daily     Continuous infusions:    dexmedetomidine 0.2 mcg/kg/hr (04/01/22 1304)       PRN medications: acetaminophen, anticoagulant citrate dextrose solution A, haloperidol lactate **AND** diphenhydrAMINE, heparin, porcine (PF), LORazepam, oxyCODONE    24 hour vital signs:  Temp:  [36.7 ??C (98.1 ??F)-37.3 ??C (99.1 ??F)] 36.8 ??C (98.2 ??F)  Heart Rate:  [67-90] 68  SpO2 Pulse:  [66-90] 69  Resp:  [14-22] 16  BP: (95-130)/(40-84) 106/52  MAP (mmHg):  [59-99] 70  SpO2:  [98 %-100 %] 100 %    Ins and Outs:  I/O this shift:  In: 480 [P.O.:480]  Out: 900 [Urine:900]    Laboratory values:  All Labs Last 24hrs:   No results found for this or any previous visit (from the past 24 hour(s)).        Imaging:  Pertinent imaging discussed in the A/P section

## 2022-04-02 LAB — URINALYSIS WITH MICROSCOPY
BACTERIA: NONE SEEN /HPF
BILIRUBIN UA: NEGATIVE
BLOOD UA: NEGATIVE
GLUCOSE UA: NEGATIVE
KETONES UA: NEGATIVE
LEUKOCYTE ESTERASE UA: NEGATIVE
NITRITE UA: NEGATIVE
PH UA: 7 (ref 5.0–9.0)
RBC UA: 2 /HPF (ref <=3)
SPECIFIC GRAVITY UA: 1.027 (ref 1.003–1.030)
SQUAMOUS EPITHELIAL: <1 /HPF (ref 0–5)
UROBILINOGEN UA: <2
WBC UA: 1 /HPF (ref <=2)

## 2022-04-02 LAB — CBC
HEMATOCRIT: 36.7 % — ABNORMAL LOW (ref 39.0–48.0)
HEMOGLOBIN: 12.6 g/dL — ABNORMAL LOW (ref 12.9–16.5)
MEAN CORPUSCULAR HEMOGLOBIN CONC: 34.4 g/dL (ref 32.0–36.0)
MEAN CORPUSCULAR HEMOGLOBIN: 32 pg (ref 25.9–32.4)
MEAN CORPUSCULAR VOLUME: 93 fL (ref 77.6–95.7)
MEAN PLATELET VOLUME: 8.3 fL (ref 6.8–10.7)
PLATELET COUNT: 158 10*9/L (ref 150–450)
RED BLOOD CELL COUNT: 3.95 10*12/L — ABNORMAL LOW (ref 4.26–5.60)
RED CELL DISTRIBUTION WIDTH: 13.7 % (ref 12.2–15.2)
WBC ADJUSTED: 5.2 10*9/L (ref 3.6–11.2)

## 2022-04-02 LAB — RENAL FUNCTION PANEL
ALBUMIN: 3.9 g/dL (ref 3.4–5.0)
ANION GAP: <1 mmol/L — ABNORMAL LOW (ref 5–14)
BLOOD UREA NITROGEN: 5 mg/dL — ABNORMAL LOW (ref 9–23)
BUN / CREAT RATIO: 6
CALCIUM: 9.1 mg/dL (ref 8.7–10.4)
CHLORIDE: 110 mmol/L — ABNORMAL HIGH (ref 98–107)
CO2: 30 mmol/L (ref 20.0–31.0)
CREATININE: 0.79 mg/dL
EGFR CKD-EPI (2021) MALE: >90 mL/min/{1.73_m2} (ref >=60)
GLUCOSE RANDOM: 91 mg/dL (ref 70–179)
PHOSPHORUS: 4.2 mg/dL (ref 2.4–5.1)
POTASSIUM: 4.7 mmol/L (ref 3.4–4.8)
SODIUM: 140 mmol/L (ref 135–145)

## 2022-04-02 LAB — VITAMIN B6: VITAMIN B6: 12 ug/L

## 2022-04-02 LAB — EXTRACTABLE NUCLEAR ANTIGEN: ENA SCREEN: 0.3 ENA Units (ref <0.70)

## 2022-04-02 LAB — MAGNESIUM: MAGNESIUM: 1.7 mg/dL (ref 1.6–2.6)

## 2022-04-02 MED ADMIN — magnesium sulfate 2gm/50mL IVPB: 2 g | INTRAVENOUS | @ 10:00:00 | Stop: 2022-04-02

## 2022-04-02 MED ADMIN — diphenhydrAMINE (BENADRYL) injection: 12.5 mg | INTRAVENOUS | @ 13:00:00

## 2022-04-02 MED ADMIN — haloperidol lactate (HALDOL) injection 2 mg: 2 mg | INTRAVENOUS | @ 13:00:00

## 2022-04-02 MED ADMIN — divalproex ER (DEPAKOTE ER) extended released 24 hr tablet 500 mg: 500 mg | ORAL | @ 13:00:00

## 2022-04-02 MED ADMIN — oxyCODONE (ROXICODONE) immediate release tablet 5 mg: 5 mg | ORAL | @ 21:00:00 | Stop: 2022-04-03

## 2022-04-02 MED ADMIN — melatonin tablet 3 mg: 3 mg | ORAL | @ 22:00:00

## 2022-04-02 MED ADMIN — acetaminophen (TYLENOL) tablet 650 mg: 650 mg | ORAL | @ 21:00:00

## 2022-04-02 MED ADMIN — enoxaparin (LOVENOX) syringe 40 mg: 40 mg | SUBCUTANEOUS

## 2022-04-02 MED ADMIN — lamoTRIgine (LaMICtal) tablet 200 mg: 200 mg | ORAL | @ 22:00:00

## 2022-04-02 MED ADMIN — divalproex ER (DEPAKOTE ER) extended released 24 hr tablet 1,000 mg: 1000 mg | ORAL | @ 22:00:00

## 2022-04-02 MED ADMIN — haloperidol lactate (HALDOL) injection 2 mg: 2 mg | INTRAVENOUS | @ 22:00:00

## 2022-04-02 MED ADMIN — heparin, porcine (PF) 100 unit/mL injection 200 Units: 200 [IU] | INTRAVENOUS | @ 20:00:00 | Stop: 2022-04-02

## 2022-04-02 MED ADMIN — diphenhydrAMINE (BENADRYL) injection: 12.5 mg | INTRAVENOUS | @ 22:00:00

## 2022-04-02 MED ADMIN — LORazepam (ATIVAN) injection 1 mg: 1 mg | INTRAVENOUS | @ 22:00:00

## 2022-04-02 MED ADMIN — albumin human 5 % 5 % bottle 3,000 mL: 3000 mL | INTRAVENOUS | @ 18:00:00 | Stop: 2022-04-02

## 2022-04-02 MED ADMIN — polyethylene glycol (MIRALAX) packet 17 g: 17 g | ORAL | @ 13:00:00

## 2022-04-02 MED ADMIN — LORazepam (ATIVAN) injection 1 mg: 1 mg | INTRAVENOUS | @ 13:00:00

## 2022-04-02 MED ADMIN — multivitamins, therapeutic with minerals tablet 1 tablet: 1 | ORAL | @ 13:00:00

## 2022-04-02 MED ADMIN — senna (SENOKOT) tablet 2 tablet: 2 | ORAL | Stop: 2022-04-11

## 2022-04-02 MED ADMIN — **PATIENT SUPPLIED** cenobamate 200 mg tablet: 2 | ORAL

## 2022-04-02 MED ADMIN — folic acid (FOLVITE) tablet 1 mg: 1 mg | ORAL | @ 13:00:00

## 2022-04-02 MED ADMIN — anticoagulant citrate dextrose solution A injection 750 mL: 750 mL | INTRAVENOUS | @ 18:00:00

## 2022-04-02 MED ADMIN — oxyCODONE (ROXICODONE) immediate release tablet 5 mg: 5 mg | ORAL | @ 13:00:00 | Stop: 2022-04-03

## 2022-04-02 MED ADMIN — acetaminophen (TYLENOL) tablet 650 mg: 650 mg | ORAL

## 2022-04-02 MED ADMIN — thiamine mononitrate (vit B1) tablet 100 mg: 100 mg | ORAL | @ 13:00:00

## 2022-04-02 MED ADMIN — oxyCODONE (ROXICODONE) immediate release tablet 5 mg: 5 mg | ORAL | @ 18:00:00 | Stop: 2022-04-03

## 2022-04-02 MED ADMIN — lamoTRIgine (LaMICtal) tablet 200 mg: 200 mg | ORAL | @ 13:00:00

## 2022-04-02 MED ADMIN — calcium chloride 100 mg/mL (10 %) syringe 1 g: 1 g | INTRAVENOUS | @ 18:00:00 | Stop: 2022-04-02

## 2022-04-02 NOTE — Unmapped (Signed)
Reason for contact    Care management        Psychosocial Update and Case Management:    Pt's father called with update. Pt is still in ICU, pulled out his line yesterday, was very angry at father yesterday for refusing to acknowledge that the patient is [employer]. Father reports that pt is not at baseline and that home is not a safe discharge plan. Family feels that [facility] is safest disposition. They feel that pt has not been manageable at home for some time and that he continues to decline. SW suggested they continue to collaborate with the inpatient team on a safe discharge plan. SW requested they keep me updated.

## 2022-04-02 NOTE — Unmapped (Cosign Needed)
Neurology Inpatient Team B (NMB)  Daily Progress Note       Patient: Austin Banks  Code Status: Full Code  Level of Care: ICU status.   LOS: 20 days      Overnight Events & Subjective:     - PLEx catheter replaced yesterday; 2 remaining sessions  - Will discuss maintenance plan with outpatient provider; patient will likely not tolerate tunneled line      Physical Exam:     General Exam:   General Appearance: lying in bed comfortably.   Lungs: Normal work of breathing.    Extremities: No clubbing or cyanosis.     Neurological Exam:   Mental Status:   Alert, calm. At baseline has mild dysarthria.     Cranial Nerves:   Mild L facial droop at rest (baseline).   EOM grossly intact.      Sensory exam:   Not done today. Previously light touch has been normal in the bilateral upper extremities and bilateral lower extremities.     Motor Exam:   Normal bulk   Moving all extremities spontaneously and equally.     Reflexes:   Deferred. Previously 1+ throughout bilaterally, Hoffmann's absent bilaterally, Toes down going bilaterally    Cerebellar/Coordination/Gai:  Not assessed       Assessment/Plan:     Assessment: Austin Banks is a 31 y.o. male with a past medical history of GAD 56 associated focal onset epilepsy on monthly IVIG, polysubstance use (currently marijuana, tobacco, remote cocaine use), psychosis, schizophrenia, depression, anxiety who was admitted to [facility] on 03/13/2022 for worsening balance, dysarthria, cognition, weight loss and poor appetite.      # Dysarthria - AMS - Imbalance - Difficulty Ambulating: Patient presented with 2 weeks of worsening balance and fluctuating mental status in the setting of recent medication changes, frequent marijuana use and falls associated with seizure and subsequent head injury. His symptoms are likely medication and drug related given the multiple CNS sedating meds he requires for seizure control with concurrent drug use. Other than the dysarthria which has improved and the multi-directional nystagmus, there are no other focal neurological findings on exam. With his history of medically refractory epilepsy and questionable compliance, cannot rule out subclinical events contributing to his fluctuating mental status as well, but EEG during admission has not revealed any seizures.    # GAD 65 c/b focal to bilateral tonic clonic seizures: Medically refractory, currently undergoing DBS vs VNS candidacy. Given one dose on IV VPA 500mg  and placed on IV maintenance as we await NG tube placement vs PO clearance. Patient endorses compliance with ASMs, however, he spends half the week with friends per father, partying and smoking marijuana and so compliance is questionable. He has had subtherapeutic levels of Lamictal in the past. Valproic acid was obtained on admission but collected after he received 500mg  IV.     Workup:  Resulted:  - CTH: No acute abnormalities, On personal review, patient has atrophy greater than expected for age cerebellum>cerebral cortex  - CT C/A/P: No abnormal lesions/masses  - CT cervical spine: No fracture or traumatic malalignment of the cervical spine.  - UA unremarkable  - VPA level 74.0  - Vitamin B12 wnl, RPR nonreactive  - Alcohol level (<10.0 4/19pm)  - Utox (+cannabinoids + benzos)  - cvEEG (03/14/22 0430 to 03/15/22 11:30 AM ): There were bilateral independent (L>>R) temporal epileptiform discharges indicating focal cortical irritation and increased potential for seizure activity over there regions. There was moderate encephalopathy which  is non-specific in etiology. There are no seizures. EEG discontinued on 4/20; restarted on 5/2 x2 days with bitemporal sharp waves and R>L temporal slowing.  - HIV negative  - lamotrigine level 6.8 (4/20)  - clobazam level 50 (4/19)  - B1 level 113 (4/19)  - GAD 65 antibody elevated 163 (03/14/22)   - previously 40.4 in June 2021  - MRI brain with and without contrast done 5/3: R > L cerebellar atrophy disproportionate for age. No abnormal enhancement  - ANA negative; ENA pending    Plan:  - Continue home lamictal 200mg  BID  - Continue IV valproate to 500mg  qAM + 1000mg  nightly at 6pm for seizure and behavioral management   - Continue haldol 2mg  bid standing dose, benadryl 12.5 mg bid standing dose, lorazepam 1mg  bid standing dose   - Continue cenobamate 400mg  at bedtime   - Discontinued onfi due to oversedation  - Discontinued Fycompa 2 mg at bedtime (s4/20, dc 4/27) given literature reports of increased agitation  - Received rituximab 1g IV (4/24)  - PLEX catheter replaced on 5/7  - Plan for PLEX x5 inpatient (s5/2, 5/4, 5/5)   - Maintenance plan TBD; discussing with outpatient provider  - Repeat GAD 65 Ab 10 days after last PLEX session    # Dysarthria - Excessive Drooling - C/f Aspiration: Patient had excessive coughing with liquid coming out his nose during bedside swallow eval and thus failed. Possibly medication related as Onfi was initiated recently (02/17/22). Father also notes dysarthria after his marijuana use. CXR suggested mild left lower lobe consolidation c/f possible aspiration PNA but lungs clear on CT chest. NG tube placed with continuous tube feeds upon admission but patient able to tolerate regular diet since 4/21.  - Nutrition consulted; appreciate recs    # Weight Loss - Poor Appetite: Patients father reports ~ 30lb weight loss over the past 6 months. Weight was 87kg in 06/2021, 77.7kg this admission. With his know GAD 65 positivity and tobacco abuse, malignancy screening with CT chest, abdomen and pelvis was obtained and unremarkable. Poor appetite could also be medication and/or tobacco use related.  - Nutrition consulted; appreciate recs     # Psychosis - Schizophrenia: IVC currently in place. Behavioral response called for increasing combativeness in attempt to elope requiring IM Ativan 2mg , followed by Haldol. Patient seems emotionally labile-appearing calm amenable to admission on initial evaluation but after his father left the bedside he became agitated, loud, refusing admission and encroaching on this examiner's personal space. Patient has required multiple psychiatric admissions in the past most recently held in our Psych holding unit in 04/2021 for behavioral problems.   -- Psychiatry consulted, appreciate recs  - IV Depakote 500mg  qAM + 1000mg  nightly (at 6pm) as above   - Depakote levels:   - 4/25 subtherapeutic 38.6   - 4/28 therapeutic at 68.1   - 4/29 subtherapeutic at 48.7              - 5/2 therapeutic at 55.3              - 5/5 therapeutic at 60.6  - Continue haldol 2mg  BID standing dose (9AM, 6PM)  - Continue Benadryl 12.5 mg BID standing dose (9AM, 6PM)  - Continue lorazepam 1mg  BID standing dose (9AM, 6PM)  - Continue propranolol 20mg  BID in place of home propranolol XR 60mg  daily  - CONTINUE home lamictal 200 mg PO BID  - HOLD home trazodone 200 mg nightly  -- Agitation PRNs:   --  Continue Haldol 2mg  IV bid PRN   -- Continue Ativan 1mg  IV bid PRN   -- Continue Benadryl 12.5mg  IV bid PRN  -- continue thiamine PO/NGT supplementation daily   -- continue folate supplementation and multivitamin daily   -- continue telemetry monitoring    - EKG to monitor QTc (405 on 5/4)     # Polysubstance Use: Seen in [facility]. He has been referred to smoking cessation program. Patient endorses daily tobacco use and marijuana use (when visiting friends weekly). Denies recent cocaine use. Utox (+cannabinoids + benzos).  - Nicotine patch daily though patient refuses.      Discharge Planning:   - Case management: consulted. Recommendations appreciated.  - Social work: N/A  - PT: consulted, 5xL  - OT: consulted, 3x  - SLP: 3x  - Expected Discharge Disposition: TBD. Referral for [facility] placed. Expanded referrals statewide for inpatient psych admission.      Checklist:  - Diet: Regular diet  - IV fluids: none  - Bowel Regimen: senna  - GI PPX: No GI indications  - DVT PPX: Lovenox 40 mg [place] daily  - Lines/Access:   Patient Lines/Drains/Airways Status       Active Active Lines, Drains, & Airways       Name Placement date Placement time Site Days    Peripheral IV 03/31/22 Anterior;Left Forearm 03/31/22  1800  Forearm  1    Hemodialysis Catheter 04/01/22 Venovenous catheter Right Internal jugular 04/01/22  1426  Internal jugular  less than 1                  - Foley: No     Patient seen and discussed with attending physician, Dr. Dan Humphreys who is in agreement with the assessment and plan.      This patient is co-managed with the NSICU Service. Under this co-management agreement, the NSICU serves as the first-call team. Please page the NSICU team at [phone] for any questions/concerns.  Neurology Team B resident can be reached at [phone].    Alwyn Pea, MD   Neurology, PGY-2     Data Review:     Contact Information:  Family contact: Darryel, Diodato (Father)   [phone] (Mobile)    PCP: [facility]    Medications:  Scheduled medications:    cenobamate  2 tablet Oral At bedtime    calcium chloride        calcium chloride        diphenhydrAMINE  12.5 mg Intravenous BID    valproate sodium  1,000 mg Intravenous Nightly    Or    divalproex ER  1,000 mg Oral Nightly    valproate sodium  500 mg Intravenous Daily before breakfast    Or    divalproex ER  500 mg Oral Daily before breakfast    enoxaparin (LOVENOX) injection  40 mg Subcutaneous Q24H SCH    folic acid  1 mg Oral Daily    haloperidol lactate  2 mg Intravenous BID    And    LORazepam  1 mg Intravenous BID    heparin, porcine (PF)        lamoTRIgine  200 mg Oral BID    magnesium sulfate  2 g Intravenous Once    melatonin  3 mg Oral QPM    multivitamins, therapeutic with minerals  1 tablet Oral Daily    polyethylene glycol  17 g Oral Daily    senna  2 tablet Oral Nightly  thiamine mononitrate (vit B1)  100 mg Oral Daily     Continuous infusions:    dexmedetomidine Stopped (04/01/22 1355)       PRN medications: acetaminophen, anticoagulant citrate dextrose solution A, haloperidol lactate **AND** diphenhydrAMINE, heparin, porcine (PF), LORazepam, oxyCODONE    24 hour vital signs:  Temp:  [36.4 ??C (97.6 ??F)-37.1 ??C (98.8 ??F)] 37.1 ??C (98.8 ??F)  Heart Rate:  [68-85] 68  SpO2 Pulse:  [68-86] 68  Resp:  [14-19] 16  BP: (91-130)/(46-84) 97/46  MAP (mmHg):  [60-99] 60  SpO2:  [96 %-100 %] 96 %    Ins and Outs:  No intake/output data recorded.    Laboratory values:  All Labs Last 24hrs:   Recent Results (from the past 24 hour(s))   CBC    Collection Time: 04/02/22  4:05 AM   Result Value Ref Range    WBC 5.2 3.6 - 11.2 10*9/L    RBC 3.95 (L) 4.26 - 5.60 10*12/L    HGB 12.6 (L) 12.9 - 16.5 g/dL    HCT 16.1 (L) 09.6 - 48.0 %    MCV 93.0 77.6 - 95.7 fL    MCH 32.0 25.9 - 32.4 pg    MCHC 34.4 32.0 - 36.0 g/dL    RDW 04.5 40.9 - 81.1 %    MPV 8.3 6.8 - 10.7 fL    Platelet 158 150 - 450 10*9/L   Renal Function Panel    Collection Time: 04/02/22  4:05 AM   Result Value Ref Range    Sodium 140 135 - 145 mmol/L    Potassium 4.7 3.4 - 4.8 mmol/L    Chloride 110 (H) 98 - 107 mmol/L    CO2 30.0 20.0 - 31.0 mmol/L    Anion Gap <1 (L) 5 - 14 mmol/L    BUN 5 (L) 9 - 23 mg/dL    Creatinine 9.14 7.82 - 1.10 mg/dL    BUN/Creatinine Ratio 6     eGFR CKD-EPI (2021) Male >90 >=60 mL/min/1.78m2    Glucose 91 70 - 179 mg/dL    Calcium 9.1 8.7 - 95.6 mg/dL    Phosphorus 4.2 2.4 - 5.1 mg/dL    Albumin 3.9 3.4 - 5.0 g/dL   Magnesium Level    Collection Time: 04/02/22  4:05 AM   Result Value Ref Range    Magnesium 1.7 1.6 - 2.6 mg/dL         Imaging:  Pertinent imaging discussed in the A/P section

## 2022-04-02 NOTE — Unmapped (Signed)
Neurocritical Care Daily Progress Note     CODE STATUS:    Code Status: Full Code    HCDM (patient stated preference): Timur, Nibert - Father - [phone]    HCDM (patient stated preference): Bacilio, Abascal - Mother - [phone]  I discussed and confirmed Code Status with patient or HCDM    Date of service: 04/02/2022  Hospital Day:  LOS: 20 days        HPI     Austin Banks is a 31 y.o. 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 The patient was admitted to the Neurology floor service for further workup. The patient was placed on EEG monitoring but was negative for seizures. The patient was also noted to have an increase in GAD 65 levels to 163 (from 40.4 in 2021). Given this increase, PLEX treatment was planned. However, since admission, the patient has been increasingly agitated and would not be compliant with PLEX treatment. The patient is now being admitted to the NSICU for possible conscious sedation for catheter placement and subsequent PLEX treatment.     Assessment and Plan     Austin Banks is a 31 y.o. male admitted to NSICU with a diagnosis of GAD 24 associated seizures and encephalopathy    24 hour events:  Tolerated placement of PLEX cathter  Sexually inappropriate gestures today, but re-directable and stops when asked    Neuro:  Mobility goal: 2 (HOB elevation)    **Seizures  - known hx of seizures; recent increase in frequency  - seizure semiology (per neurology notes from previous admissions): Begins with right eye deviation, left facial clonic jerking, eye blinking, mumbling. LUE flexed with coordinated back and forth movements of his bilateral lower extremities and drool from the left side of his mouth. All movement abruptly ceases at ictal offset with pelvic thrusting. Family also describes hand automatisms after shaking at times. Post-ictal confusion lasting up to 20 minutes. 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  If needed, may utilize prn haldol as rescue  - seizure management as above  - previously encephalopathy workup unremarkable  - ENA/ANA pending    **Polysubstance abuse  - current marijuana and tobacco use, remote cocaine use  - continue thiamine, multivitamins, and folate  - monitor for signs of withdrawal    **Psychosis/Schizophrenia/Agitation  - increasingly agitated since admission to [facility]  - has required multiple psychiatric admissions in the past, most recently at [facility] holding unit in 04/2021 for behavioral problems  - Psychiatry following, appreciate recs  - continue scheduled and PRN haldol 2mg  BID, benadryl 1mg  BID, and  lorazepam 1mg  BID  - propanolol 20mg  BID  - Has not required PRN since the 4th ( except during PLEX)    Cardiovascular:  Admission weight: 77.1kg  Daily Weight: 77.3 kg (170 lb 6.7 oz)     Hemodynamic goals:  - MAP > 65  - SBP < 180  - transition vital signs to Q4 hours unless on Precedex or during PLEX    Pulmonary    - stable on room air    **tobacco abuse  - every day smoker, 10 pack years  - refusing nicotine supplementation  - smoking cessation counseling      FEN  Body mass index is 23.11 kg/m??.    GI prophylaxis: not indicated  Last BM Date: 04/01/22  Foley catheter: No foley in place.     **Nutrition  - advance diet as tolerated    **Glycemic control  - none needed    Heme/ID  Current DVT prophylaxis: SCDs, Lovenox 40 mg daily    - start chemical DVT prophylaxis given high risk    Labs on PLEX days    Current Access:         -  PIV x 2       - Arterial line: No arterial line present.       - Central venous line: Central line required for PLEX.    Tubes and drains:  Patient Lines/Drains/Airways Status       Active Active Lines, Drains, & Airways       Name Placement date Placement time Site Days    Peripheral IV 03/31/22 Anterior;Left Forearm 03/31/22  1800  Forearm  1    Hemodialysis Catheter 04/01/22 Venovenous catheter Right Internal jugular 04/01/22  1426  Internal jugular  less than 1                           Objective Data     All vital signs and resulted laboratory studies for the past 24 hours have been reviewed.  All ordered medications have been reviewed.    Temp:  [36.4 ??C (97.6 ??F)-37.1 ??C (98.8 ??F)] 37.1 ??C (98.8 ??F)  Heart Rate:  [68-85] 68  SpO2 Pulse:  [68-86] 68  Resp:  [14-19] 16  BP: (91-128)/(46-68) 97/46  MAP (mmHg):  [60-88] 60  SpO2:  [96 %-100 %] 96 %    Intake/Output Summary (Last 24 hours) at 04/02/2022 1016  Last data filed at 04/02/2022 0800  Gross per 24 hour   Intake 943.9 ml   Output 1225 ml   Net -281.1 ml          Physical Exam          General Exam:  General: Lying in bed. Appears comfortable. No obvious distress.   ENT:  Mucous membranes moist. Oropharynx clear.  Cardiovascular:  Regular rate and rhythm.  No murmurs.   2+ radial pulses bilaterally.    Respiratory: Breathing is comfortable and unlabored.  Lungs are clear to ausculation bilaterally.    Gastrointestinal: Soft, nontender, nondistended.  Extremities: Warm and well-perfused. No cyanosis, clubbing, or edema.  Skin: No obvious rashes or ecchymoses.    Neurological Exam:  Mental Status  LOC: awake and alert  Orientation: Oriented x 4  Neglect: not present  Speech: normal  Language: UTA    Cranial Nerves  Pupils: Does not let you evaluate  Gaze: Resting nystagmus  Face Motor: normal and symmetric  Cough: strong  Left facial twitching  Motor:   RUE: spontaneous and 5/5  LUE: spontaneous and 5/5  RLE: spontaneous and 5/5  LLE: spontaneous and 5/5     Sensory:    Sensory: UTA    Glasgow Coma Score  Motor: obeys commands = 6  Verbal: oriented = 5  Eyes: eyes open spontaneously = 4        DISPOSITION     The patient requires admission to the [facility] for management of the above conditions.    Estimated Transfer Date:  5/10 (will wait until he finishes PLEX to dc catheter)        BILLING     This is a subsequent evaluation of the patient.    Terri Skains, MD  Neurocritical Care Attending

## 2022-04-02 NOTE — Unmapped (Signed)
Neuro: alert and oriented x4. + c/c. Gag deferred Moves all 4 extremities to command with full strength and full sensation.   CV: NSR on the monitor. SBP goal <180, MAP > 65.   Resp: Lungs clear bilaterally. Room air  Heme: SCDs in place bilaterally.  GI: Regular oral diet.   GU/Renal: Voiding with adequate output.  Skin: turns self.  ENDO:  2 g Mag replaced  Lines: 1 PIV.  Pain: tylenol given once this shift.  Problem: Adult Inpatient Plan of Care  Goal: Plan of Care Review  Outcome: Ongoing - Unchanged  Goal: Patient-Specific Goal (Individualized)  Outcome: Ongoing - Unchanged  Goal: Absence of Hospital-Acquired Illness or Injury  Outcome: Ongoing - Unchanged  Intervention: Identify and Manage Fall Risk  Recent Flowsheet Documentation  Taken 04/01/2022 2000 by Rosemarie Ax, RN  Safety Interventions:   bed alarm   elopement precautions   environmental modification   fall reduction program maintained   family at bedside   lighting adjusted for tasks/safety   low bed  Intervention: Prevent Skin Injury  Recent Flowsheet Documentation  Taken 04/01/2022 2000 by Rosemarie Ax, RN  Skin Protection: adhesive use limited  Intervention: Prevent and Manage VTE (Venous Thromboembolism) Risk  Recent Flowsheet Documentation  Taken 04/01/2022 2000 by Rosemarie Ax, RN  Activity Management: activity adjusted per tolerance  Range of Motion: Bilateral Upper and Lower Extremities  Intervention: Prevent Infection  Recent Flowsheet Documentation  Taken 04/01/2022 2000 by Rosemarie Ax, RN  Infection Prevention:   cohorting utilized   environmental surveillance performed  Goal: Optimal Comfort and Wellbeing  Outcome: Ongoing - Unchanged  Goal: Readiness for Transition of Care  Outcome: Ongoing - Unchanged  Goal: Rounds/Family Conference  Outcome: Ongoing - Unchanged     Problem: Seizure Disorder Comorbidity  Goal: Maintenance of Seizure Control  Outcome: Ongoing - Unchanged     Problem: Self-Care Deficit  Goal: Improved Ability to Complete Activities of Daily Living  Outcome: Ongoing - Unchanged     Problem: Violence Risk or Actual  Goal: Anger and Impulse Control  Outcome: Ongoing - Unchanged     Problem: VTE (Venous Thromboembolism)  Goal: VTE (Venous Thromboembolism) Symptom Resolution  Outcome: Ongoing - Unchanged  Intervention: Prevent or Manage VTE (Venous Thromboembolism)  Recent Flowsheet Documentation  Taken 04/02/2022 0600 by Rosemarie Ax, RN  Anti-Embolism Device Type: SCD, Knee  Anti-Embolism Intervention: Refused  Anti-Embolism Device Location: BLE  Taken 04/02/2022 0400 by Rosemarie Ax, RN  Anti-Embolism Device Type: SCD, Knee  Anti-Embolism Intervention: Refused  Anti-Embolism Device Location: BLE  Taken 04/02/2022 0200 by Rosemarie Ax, RN  Anti-Embolism Device Type: SCD, Knee  Anti-Embolism Intervention: Refused  Anti-Embolism Device Location: BLE  Taken 04/02/2022 0000 by Rosemarie Ax, RN  Anti-Embolism Device Type: SCD, Knee  Anti-Embolism Intervention: Refused  Anti-Embolism Device Location: BLE  Taken 04/01/2022 2200 by Rosemarie Ax, RN  Anti-Embolism Device Type: SCD, Knee  Anti-Embolism Intervention: Refused  Anti-Embolism Device Location: BLE  Taken 04/01/2022 2000 by Rosemarie Ax, RN  Anti-Embolism Device Type: SCD, Knee  Anti-Embolism Intervention: Refused  Anti-Embolism Device Location: BLE     Problem: Seizure, Active Management  Goal: Absence of Seizure/Seizure-Related Injury  Outcome: Ongoing - Unchanged  Intervention: Prevent Seizure-Related Injury  Recent Flowsheet Documentation  Taken 04/01/2022 2000 by Rosemarie Ax, RN  Aspiration Prevention During Seizure: positioned on side during seizure     Problem: Adjustment to Illness (Delirium)  Goal: Optimal Coping  Outcome: Ongoing - Unchanged     Problem: Altered Behavior (  Delirium)  Goal: Improved Behavioral Control  Outcome: Ongoing - Unchanged     Problem: Attention and Thought Clarity Impairment (Delirium)  Goal: Improved Attention and Thought Clarity  Outcome: Ongoing - Unchanged  Intervention: Maximize Cognitive Function  Recent Flowsheet Documentation  Taken 04/01/2022 2000 by Rosemarie Ax, RN  Reorientation Measures:   clock in view   familiar social contact encouraged     Problem: Sleep Disturbance (Delirium)  Goal: Improved Sleep  Outcome: Ongoing - Unchanged     Problem: Fall Injury Risk  Goal: Absence of Fall and Fall-Related Injury  Outcome: Ongoing - Unchanged  Intervention: Promote Injury-Free Environment  Recent Flowsheet Documentation  Taken 04/01/2022 2000 by Rosemarie Ax, RN  Safety Interventions:   bed alarm   elopement precautions   environmental modification   fall reduction program maintained   family at bedside   lighting adjusted for tasks/safety   low bed     Problem: Skin Injury Risk Increased  Goal: Skin Health and Integrity  Outcome: Ongoing - Unchanged  Intervention: Optimize Skin Protection  Recent Flowsheet Documentation  Taken 04/02/2022 0400 by Rosemarie Ax, RN  Head of Bed [facility]) Positioning: HOB at 30-45 degrees  Taken 04/02/2022 0200 by Rosemarie Ax, RN  Head of Bed [facility]) Positioning: HOB at 30-45 degrees  Taken 04/02/2022 0000 by Rosemarie Ax, RN  Head of Bed [facility]) Positioning: HOB at 30-45 degrees  Taken 04/01/2022 2000 by Rosemarie Ax, RN  Pressure Reduction Techniques: frequent weight shift encouraged  Head of Bed (HOB) Positioning: HOB at 30-45 degrees  Pressure Reduction Devices:   alternating pressure pump (ADD)   pressure-redistributing mattress utilized  Skin Protection: adhesive use limited

## 2022-04-02 NOTE — Unmapped (Signed)
31 y.o. 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   Neuro: Drowsy but easily aroused, A/OX4, FC, horizontal nystagmus, pupils 4mm equal and brisk.   It is hard to get him to get him to fully participate in exams at times.  Flat affect, but remains cooperative No episode of aggression today.  He continues to get valproate 500mg  daily and 1000mg  nightly, lamotigine 200mg  2 x daily, propranol 20mg  2xdaily, benadryl 12.5mg /haldol 2mg  BID/ativan1mg  BID, and Cenobamate 200mg  at bedtime added melatonin 3mg  nightly. Bilateral tough cuffs loosely toed today allowing patient to feed himself and reposition himself while in bed and in the chair.  CV: NSR no ectopy  Heme: refuses SCD lovenox as ordered.  Resp: RA CTA   GI: LBM 5/5, miralax and senna as ordered  Renal/Endo: voiding, lab holiday  ID/Skin/lines: skin intact no PIs, afebrile, pIVX1  Miscell: mother and father visiting this evening  Problem: Adult Inpatient Plan of Care  Goal: Plan of Care Review  04/01/2022 2001 by Jacqulyn Cane, RN  Outcome: Progressing  04/01/2022 1748 by Jacqulyn Cane, RN  Outcome: Progressing  Goal: Patient-Specific Goal (Individualized)  04/01/2022 2001 by Jacqulyn Cane, RN  Outcome: Progressing  04/01/2022 1748 by Jacqulyn Cane, RN  Outcome: Progressing  Goal: Absence of Hospital-Acquired Illness or Injury  04/01/2022 2001 by Jacqulyn Cane, RN  Outcome: Progressing  04/01/2022 1748 by Jacqulyn Cane, RN  Outcome: Progressing  Intervention: Identify and Manage Fall Risk  Recent Flowsheet Documentation  Taken 04/01/2022 0800 by Jacqulyn Cane, RN  Safety Interventions:   fall reduction program maintained   elopement precautions  Intervention: Prevent and Manage VTE (Venous Thromboembolism) Risk  Recent Flowsheet Documentation  Taken 04/01/2022 1800 by Jacqulyn Cane, RN  Activity Management:   activity adjusted per tolerance   activity encouraged  Intervention: Prevent Infection  Recent Flowsheet Documentation  Taken 04/01/2022 0800 by Jacqulyn Cane, RN  Infection Prevention:   hand hygiene promoted   equipment surfaces disinfected   environmental surveillance performed  Goal: Optimal Comfort and Wellbeing  04/01/2022 2001 by Jacqulyn Cane, RN  Outcome: Progressing  04/01/2022 1748 by Jacqulyn Cane, RN  Outcome: Progressing  Goal: Readiness for Transition of Care  04/01/2022 2001 by Jacqulyn Cane, RN  Outcome: Progressing  04/01/2022 1748 by Jacqulyn Cane, RN  Outcome: Progressing  Goal: Rounds/Family Conference  04/01/2022 2001 by Jacqulyn Cane, RN  Outcome: Progressing  04/01/2022 1748 by Jacqulyn Cane, RN  Outcome: Progressing

## 2022-04-02 NOTE — Unmapped (Addendum)
I called and spoke with the patient's mother and father via telephone. I stated that he is currently undergoing his 4th PLEX treatment and tolerating it well. His next PLEX treatment will be this Wednesday (5/10). I did relay that there is a possibility Damain may require maintenance PLEX therapies in the future. They acknowledge and understand this. All questions and concerns were addressed.     Geanie Cooley, ACNP  Neurocritical Care

## 2022-04-02 NOTE — Unmapped (Signed)
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    Procedure #: 4/5     Labs:   HCT   Date Value Ref Range Status   04/02/2022 36.7 (L) 39.0 - 48.0 % Final   09/15/2014 38.4 (L) 41.0 - 53.0 % Final     Platelet   Date Value Ref Range Status   04/02/2022 158 150 - 450 10*9/L Final   09/15/2014 231 150 - 440 10*9/L Final       Premedications:  None    Procedure:  Following a timeout, the patient was accessed by the treating RN and good return blood flow was achieved.  A 1.0 plasma volume Therapeutic Plasma Exchange was performed.    Replacement Fluid: 5% albumin  Calcium replacement: None  Complications: None  Fluid balance: +19 mL      The procedure was well tolerated.    For more details about this procedure, see the patient's encounter for this procedure, and go to the All Flowsheet Templates section and see the [facility] Plasma Exchange Treatment Flowsheet.    Vedia Pereyra, D.O.  Department of Laboratory Medicine and Pathology  Resident Physician, PGY-1  Pager: [phone]      Transfusion Medicine Attending Attestation:  I was present/available throughout the entire procedure. I reviewed the resident's note and agree with the resident's findings. For questions regarding the procedure, please contact the Transfusion Medicine resident on call.    Paulla Fore, MD  Transfusion Medicine Service

## 2022-04-02 NOTE — Unmapped (Signed)
31 y.o. 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   Neuro: Drowsy but easily aroused, A/OX4, FC, horizontal nystagmus, pupils 4mm equal and brisk.   Pleasant and more cooperative today, participate in exams as asked.  Flat affect, but remains cooperative No episode of aggression today.  is restraints were removed and has shown no need to need them.  He continues to get valproate 500mg  daily and 1000mg  nightly, lamotigine 200mg  2 x daily, propranol 20mg  2xdaily, benadryl 12.5mg /haldol 2mg  BID/ativan1mg  BID, and Cenobamate 200mg  at bedtime added melatonin 3mg  nightly.   CV: NSR no ectopy  Heme: refuses SCD lovenox as ordered.  Resp: RA CTA   GI: LBM 5/5, miralax and senna as ordered  Renal/Endo: voiding, lab holiday  ID/Skin/lines: skin intact no PIs, afebrile, pIVX1, right neck plex cath replaced today patient remained still and required no medications for placement.  Miscell: mother and father visiting this evening  Problem: Adult Inpatient Plan of Care  Goal: Plan of Care Review  04/01/2022 2009 by Jacqulyn Cane, RN  Outcome: Progressing  04/01/2022 2001 by Jacqulyn Cane, RN  Outcome: Progressing  04/01/2022 1748 by Jacqulyn Cane, RN  Outcome: Progressing  Goal: Patient-Specific Goal (Individualized)  04/01/2022 2009 by Jacqulyn Cane, RN  Outcome: Progressing  04/01/2022 2001 by Jacqulyn Cane, RN  Outcome: Progressing  04/01/2022 1748 by Jacqulyn Cane, RN  Outcome: Progressing  Goal: Absence of Hospital-Acquired Illness or Injury  04/01/2022 2009 by Jacqulyn Cane, RN  Outcome: Progressing  04/01/2022 2001 by Jacqulyn Cane, RN  Outcome: Progressing  04/01/2022 1748 by Jacqulyn Cane, RN  Outcome: Progressing  Intervention: Identify and Manage Fall Risk  Recent Flowsheet Documentation  Taken 04/01/2022 0800 by Jacqulyn Cane, RN  Safety Interventions:   fall reduction program maintained   elopement precautions  Intervention: Prevent and Manage VTE (Venous Thromboembolism) Risk  Recent Flowsheet Documentation  Taken 04/01/2022 1800 by Jacqulyn Cane, RN  Activity Management:   activity adjusted per tolerance   activity encouraged  Intervention: Prevent Infection  Recent Flowsheet Documentation  Taken 04/01/2022 0800 by Jacqulyn Cane, RN  Infection Prevention:   hand hygiene promoted   equipment surfaces disinfected   environmental surveillance performed  Goal: Optimal Comfort and Wellbeing  04/01/2022 2009 by Jacqulyn Cane, RN  Outcome: Progressing  04/01/2022 2001 by Jacqulyn Cane, RN  Outcome: Progressing  04/01/2022 1748 by Jacqulyn Cane, RN  Outcome: Progressing  Goal: Readiness for Transition of Care  04/01/2022 2009 by Jacqulyn Cane, RN  Outcome: Progressing  04/01/2022 2001 by Jacqulyn Cane, RN  Outcome: Progressing  04/01/2022 1748 by Jacqulyn Cane, RN  Outcome: Progressing  Goal: Rounds/Family Conference  04/01/2022 2009 by Jacqulyn Cane, RN  Outcome: Progressing  04/01/2022 2001 by Jacqulyn Cane, RN  Outcome: Progressing  04/01/2022 1748 by Jacqulyn Cane, RN  Outcome: Progressing

## 2022-04-02 NOTE — Unmapped (Shared)
Neurocritical Care   Supplementary Progress Note        ASSESSMENT / PLAN     Austin Banks is a 31 y.o. 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  Please see today's progress note for full details.  Ongoing management of the active conditions and plan is below.    **Seizures  - known hx of seizures; recent increase in frequency  - seizure semiology (per neurology notes from previous admissions): Begins with right eye deviation, left facial clonic jerking, eye blinking, mumbling. LUE flexed with coordinated back and forth movements of his bilateral lower extremities and drool from the left side of his mouth. All movement abruptly ceases at ictal offset with pelvic thrusting. Family also describes hand automatisms after shaking at times. Post-ictal confusion lasting up to 20 minutes. 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  If needed, may utilize prn haldol as rescue -> 1 more treatment after today  - ENA (pending)***/ ANA (negative)    **Tachycardia  - HR down into the 45s last night   - restart propranolol 20 mg BID   - obtain repeat ECG to eval QTC; pending***    **Polysubstance abuse  - current marijuana and tobacco use, remote cocaine use  - continue thiamine, multivitamins, and folate  - monitor for signs of withdrawal     **Psychosis/Schizophrenia/Agitation  - increasingly agitated since admission to [facility]  - has required multiple psychiatric admissions in the past, most recently at [facility] holding unit in 04/2021 for behavioral problems  - continue scheduled and PRN haldol 2mg  BID, benadryl 1mg  BID, and lorazepam 1mg  BID -> has not required PRN since 5/4 (except during PLEX)  - psychiatry following; appreciate recs    **Family Update: I spoke on the phone with the patient's father today regarding the updates for the PLEX treatments. Also, I made the father aware that additional PLEX treatments beyond the five planned here in the NSICU may be required, but this would be at an outpatient treatment center. Also, I explained that the patient had not required any additional PRN mannitol from the scheduled mannitol, meaning that he is tolerating the PLEX treatments well. I conveyed to his father that the patient will be ready for a visitor as soon as this evening, but he said they will most likely come to the NSICU to visit tomorrow. All questions answered at this time.      OBJECTIVE DATA       Vitals Reviewed:   Temp:  [36.4 ??C (97.6 ??F)-37.1 ??C (98.8 ??F)] 36.9 ??C (98.4 ??F)  Heart Rate:  [68-100] 86  SpO2 Pulse:  [68-77] 68  Resp:  [16-19] 16  BP: (91-149)/(46-68) 123/64  MAP (mmHg):  [60-97] 82  SpO2:  [96 %-100 %] 98 %  Temp (24hrs), Avg:36.8 ??C (98.2 ??F), Min:36.4 ??C (97.6 ??F), Max:37.1 ??C (98.8 ??F)    SpO2: 98 %  Height: 182.9 cm (6')  Weight: 77.3 kg (170 lb 6.7 oz)   Body mass index is 23.11 kg/m??.    Body surface area is 1.98 meters squared.     Continuous Infusions:        Ventilator settings:         NEURO EXAM      Mental status:   LOC: Awake and alert  Orientation: person, place, time  Speech: Mild dysarthria  Language: answers most questions appropriately, will perseverate on things   *flat affect with inappropriate sexual gestures today, but re-directable and stops when asked    Cranial nerves:   Pupils: does not let you evaluate  Corneal reflex: UTA  Gaze:  resting nystagmus  Face sensory: UTA  Face motor: normal  Vestibular: UTA  Cough: normal  Gag: UTA  Tongue: midline  *Mild L facial droop at baseline    Motor:   RUE: spontaneous and 5/5  LUE: spontaneous and 5/5  RLE: spontaneous and 5/5  LLE: spontaneous and 5/5     Sensory: UTA       BILLING     This patient is critically ill or injured with the impairment of vital organ systems such that there is a high probability of imminent or life threatening deterioration in the patient's condition. The reason the patient is critically ill and the nature of the treatment and management provided to manage the critically ill patient is as listed in above note.   I directly provided *** minutes of critical care time as documented in this note. Time includes: direct patient care, patient reassessment, coordination of patient care, interpretation of data, review of patient medical records, and documentation of patient care. This time is exclusive of separately billable procedures.    Scribe Statement:   Documentation assistance was provided by me personally, Adeoluwa Silvers, a scribe. Geanie Cooley, ACNP, obtained and performed the history, physical exam and medical decision making elements that were entered into the chart. Signed by Harrell Gave, 04/02/22 at 3:39 PM     Provider Attestation:   Documentation assistance was provided by Harrell Gave, a scribe.  IGeanie Cooley, ACNP, was present during the time the encounter was recorded. The information recorded by the Scribe was done at my direction and has been reviewed and validated by me.

## 2022-04-02 NOTE — Unmapped (Addendum)
31 y.o. 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   Neuro: Drowsy but easily aroused, A/OX4, FC, horizontal nystagmus, pupils 4mm equal and brisk.   It is hard to get him to get him to fully participate in exams.  Flat affect, but remains cooperative but questions most things ie. Medications, IV flushes, etc Pt had one episode of aggression after he refused to put his wrist restraint back out that he had slipped out of.  He thought the PNA was watching him in a weird way and when were were asking if he wanted to switch to soft bilateral wrist he would not let us come near him. been up with min assist to chair.  I did not want to get him upset and choose to call the hospital police and they were able to get him back in his restraints without difficulty. He pulled the apheresis cath out his neck just after shift change while the PNA was in the room and after she had ben oriented on panic and call button and his staus and told she must be watchful and be sure his does not get to his line He continues to get valproate 500mg  daily and 1000mg  nightly, lamotigine 200mg  2 x daily, propranol 20mg  2xdaily, benadryl 12.5mg /haldol 2mg  BID/ativan1mg  BID, and Cenobamate 200mg  at bedtime.   CV: NSR no ectopy  Heme: refuses SCD lovenox as ordered.  Resp: RA CTA   GI: LBM today, miralax and senna as ordered  Renal/Endo: voiding, lab holiday  ID/Skin/lines: skin intact no PIs, afebrile, pIVX1  Miscell: mother and father visiting this evening    Problem: Adult Inpatient Plan of Care  Goal: Plan of Care Review  Outcome: Progressing  Goal: Patient-Specific Goal (Individualized)  Outcome: Progressing  Goal: Absence of Hospital-Acquired Illness or Injury  Outcome: Progressing  Intervention: Identify and Manage Fall Risk  Recent Flowsheet Documentation  Taken 04/01/2022 0800 by Jacqulyn Cane, RN  Safety Interventions:  ??? fall reduction program maintained  ??? elopement precautions  Intervention: Prevent Infection  Recent Flowsheet Documentation  Taken 04/01/2022 0800 by Jacqulyn Cane, RN  Infection Prevention:  ??? hand hygiene promoted  ??? equipment surfaces disinfected  ??? environmental surveillance performed  Goal: Optimal Comfort and Wellbeing  Outcome: Progressing  Goal: Readiness for Transition of Care  Outcome: Progressing  Goal: Rounds/Family Conference  Outcome: Progressing

## 2022-04-02 NOTE — Unmapped (Cosign Needed)
Neurocritical Care   Procedure Note       Procedure: insertion of central venous line    Pre-procedure diagnosis:  need for PLEX  Indications: PLEX therapy    Central venous line    Date/Time:  Apr 01, 2022 2:15 PM  Performed by:  Cinda Quest  Consent: Written consent obtained.  The risks of the procedure including but not limited to pain, bleeding, hematoma formation, infection, distal ischemia, and injury to blood vessels and/or nerves were discussed with [name], the patient or patient's surrogate decision maker. - obtained previously, but called [facility] (and left message with Marylene Land) to make them aware line was being replaced.  Patient identity confirmed: Arm band, hospital-assigned identification number or anonymous protocol, patient vented/unresponsive  Time out: Immediately prior to procedure a time out was called to verify the correct patient, procedure, equipment, support staff, labs and imaging as required.  Preparation: Patient was prepped and draped in the usual sterile fashion, the site prepped with chlorhexidine.  Anesthetic: local with lidocaine and precedex  Location: right internal jugular  Number of attempts: one  Line placement confirmed with: CXR  Complications: No obvious complications were appreciated at the conclusion of the procedure.      Procedure:  After a time out, the patient was appropriately positioned. The site was prepped with chlorhexidine and draped in the usual sterile fashion. The above vessel was identified via ultrasound, cannulated easily and a soft J-tip guidewire was introduced into the vessel. The guidewire was confirmed to be in a venous structure by static and dynamic ultrasound.  The tract was dilated and a 15 cm/13 Fr Trialysis catheter was inserted, via sterile Seldinger technique, without difficulty. The intact guidewire was removed and the ports were flushed without difficulty. The catheter was secured with two sutures at 15 cm. A sterile dressing was applied. The patient appeared to have tolerated the procedure well and remained stable in the immediate post procedure period.       Cinda Quest, ACNP  Neurocritical Care

## 2022-04-03 LAB — CBC W/ AUTO DIFF
BASOPHILS ABSOLUTE COUNT: 0 10*9/L (ref 0.0–0.1)
BASOPHILS RELATIVE PERCENT: 0.5 %
EOSINOPHILS ABSOLUTE COUNT: 0.3 10*9/L (ref 0.0–0.5)
EOSINOPHILS RELATIVE PERCENT: 6.3 %
HEMATOCRIT: 35.1 % — ABNORMAL LOW (ref 39.0–48.0)
HEMOGLOBIN: 12.1 g/dL — ABNORMAL LOW (ref 12.9–16.5)
LYMPHOCYTES ABSOLUTE COUNT: 1.8 10*9/L (ref 1.1–3.6)
LYMPHOCYTES RELATIVE PERCENT: 43.6 %
MEAN CORPUSCULAR HEMOGLOBIN CONC: 34.6 g/dL (ref 32.0–36.0)
MEAN CORPUSCULAR HEMOGLOBIN: 32 pg (ref 25.9–32.4)
MEAN CORPUSCULAR VOLUME: 92.6 fL (ref 77.6–95.7)
MEAN PLATELET VOLUME: 8.6 fL (ref 6.8–10.7)
MONOCYTES ABSOLUTE COUNT: 0.7 10*9/L (ref 0.3–0.8)
MONOCYTES RELATIVE PERCENT: 17 %
NEUTROPHILS ABSOLUTE COUNT: 1.3 10*9/L — ABNORMAL LOW (ref 1.8–7.8)
NEUTROPHILS RELATIVE PERCENT: 32.6 %
PLATELET COUNT: 153 10*9/L (ref 150–450)
RED BLOOD CELL COUNT: 3.79 10*12/L — ABNORMAL LOW (ref 4.26–5.60)
RED CELL DISTRIBUTION WIDTH: 13.8 % (ref 12.2–15.2)
WBC ADJUSTED: 4.1 10*9/L (ref 3.6–11.2)

## 2022-04-03 LAB — COMPREHENSIVE METABOLIC PANEL
ALBUMIN: 3.8 g/dL (ref 3.4–5.0)
ALKALINE PHOSPHATASE: 20 U/L — ABNORMAL LOW (ref 46–116)
ALT (SGPT): 12 U/L (ref 10–49)
ANION GAP: 7 mmol/L (ref 5–14)
AST (SGOT): 15 U/L (ref <=34)
BILIRUBIN TOTAL: 0.2 mg/dL — ABNORMAL LOW (ref 0.3–1.2)
BLOOD UREA NITROGEN: 7 mg/dL — ABNORMAL LOW (ref 9–23)
BUN / CREAT RATIO: 9
CALCIUM: 8.6 mg/dL — ABNORMAL LOW (ref 8.7–10.4)
CHLORIDE: 107 mmol/L (ref 98–107)
CO2: 27 mmol/L (ref 20.0–31.0)
CREATININE: 0.76 mg/dL
EGFR CKD-EPI (2021) MALE: >90 mL/min/{1.73_m2} (ref >=60)
GLUCOSE RANDOM: 92 mg/dL (ref 70–179)
POTASSIUM: 4.5 mmol/L (ref 3.4–4.8)
PROTEIN TOTAL: 5.1 g/dL — ABNORMAL LOW (ref 5.7–8.2)
SODIUM: 141 mmol/L (ref 135–145)

## 2022-04-03 MED ADMIN — haloperidol lactate (HALDOL) injection 2 mg: 2 mg | INTRAVENOUS | @ 22:00:00

## 2022-04-03 MED ADMIN — heparin, porcine (PF) 100 unit/mL injection 200 Units: 200 [IU] | INTRAVENOUS | @ 18:00:00 | Stop: 2022-04-03

## 2022-04-03 MED ADMIN — oxyCODONE (ROXICODONE) immediate release tablet 5 mg: 5 mg | ORAL | @ 01:00:00 | Stop: 2022-04-03

## 2022-04-03 MED ADMIN — propranoloL (INDERAL) tablet 20 mg: 20 mg | ORAL

## 2022-04-03 MED ADMIN — oxyCODONE (ROXICODONE) immediate release tablet 5 mg: 5 mg | ORAL | @ 07:00:00 | Stop: 2022-04-03

## 2022-04-03 MED ADMIN — **PATIENT SUPPLIED** cenobamate 200 mg tablet: 2 | ORAL

## 2022-04-03 MED ADMIN — anticoagulant citrate dextrose solution A injection 750 mL: 750 mL | INTRAVENOUS | @ 16:00:00

## 2022-04-03 MED ADMIN — albumin human 5 % 5 % bottle 3,000 mL: 3000 mL | INTRAVENOUS | @ 16:00:00 | Stop: 2022-04-03

## 2022-04-03 MED ADMIN — multivitamins, therapeutic with minerals tablet 1 tablet: 1 | ORAL | @ 12:00:00

## 2022-04-03 MED ADMIN — lamoTRIgine (LaMICtal) tablet 200 mg: 200 mg | ORAL | @ 12:00:00

## 2022-04-03 MED ADMIN — divalproex ER (DEPAKOTE ER) extended released 24 hr tablet 500 mg: 500 mg | ORAL | @ 12:00:00

## 2022-04-03 MED ADMIN — melatonin tablet 3 mg: 3 mg | ORAL | @ 22:00:00

## 2022-04-03 MED ADMIN — thiamine mononitrate (vit B1) tablet 100 mg: 100 mg | ORAL | @ 12:00:00

## 2022-04-03 MED ADMIN — LORazepam (ATIVAN) injection 1 mg: 1 mg | INTRAVENOUS | @ 22:00:00

## 2022-04-03 MED ADMIN — diphenhydrAMINE (BENADRYL) injection: 12.5 mg | INTRAVENOUS | @ 22:00:00

## 2022-04-03 MED ADMIN — diphenhydrAMINE (BENADRYL) injection: 12.5 mg | INTRAVENOUS | @ 12:00:00

## 2022-04-03 MED ADMIN — haloperidol lactate (HALDOL) injection 2 mg: 2 mg | INTRAVENOUS | @ 12:00:00

## 2022-04-03 MED ADMIN — divalproex ER (DEPAKOTE ER) extended released 24 hr tablet 1,000 mg: 1000 mg | ORAL | @ 22:00:00

## 2022-04-03 MED ADMIN — acetaminophen (TYLENOL) tablet 650 mg: 650 mg | ORAL | @ 22:00:00

## 2022-04-03 MED ADMIN — propranoloL (INDERAL) tablet 20 mg: 20 mg | ORAL | @ 12:00:00

## 2022-04-03 MED ADMIN — enoxaparin (LOVENOX) syringe 40 mg: 40 mg | SUBCUTANEOUS

## 2022-04-03 MED ADMIN — folic acid (FOLVITE) tablet 1 mg: 1 mg | ORAL | @ 12:00:00

## 2022-04-03 MED ADMIN — acetaminophen (TYLENOL) tablet 650 mg: 650 mg | ORAL | @ 11:00:00

## 2022-04-03 MED ADMIN — LORazepam (ATIVAN) injection 1 mg: 1 mg | INTRAVENOUS | @ 12:00:00

## 2022-04-03 MED ADMIN — senna (SENOKOT) tablet 2 tablet: 2 | ORAL | Stop: 2022-04-11

## 2022-04-03 MED ADMIN — lamoTRIgine (LaMICtal) tablet 200 mg: 200 mg | ORAL | @ 22:00:00

## 2022-04-03 MED ADMIN — calcium chloride 100 mg/mL (10 %) syringe 1 g: 1 g | INTRAVENOUS | @ 16:00:00 | Stop: 2022-04-03

## 2022-04-03 NOTE — Unmapped (Incomplete)
Neuro: Pt alert and oriented - following x4 - occasionally inappropriate - mood can be very labile - PERRL; +c/c  CV: NSR - SBP goal <180 - low BP over night  Respiratory: RA  GI: regular diet - last bm was   GU: voiding via urinal  Heme: SCDs on  ID: afebrile  Skin: plex cath site  Pain: PRNs in MAR  Lines: trialysis cath -1 PIV  Gtts: -    Problem: Adult Inpatient Plan of Care  Goal: Plan of Care Review  Outcome: Ongoing - Unchanged  Goal: Patient-Specific Goal (Individualized)  Outcome: Ongoing - Unchanged  Goal: Absence of Hospital-Acquired Illness or Injury  Outcome: Ongoing - Unchanged  Goal: Optimal Comfort and Wellbeing  Outcome: Ongoing - Unchanged  Goal: Readiness for Transition of Care  Outcome: Ongoing - Unchanged  Goal: Rounds/Family Conference  Outcome: Ongoing - Unchanged     Problem: Seizure Disorder Comorbidity  Goal: Maintenance of Seizure Control  Outcome: Ongoing - Unchanged     Problem: Self-Care Deficit  Goal: Improved Ability to Complete Activities of Daily Living  Outcome: Ongoing - Unchanged     Problem: Violence Risk or Actual  Goal: Anger and Impulse Control  Outcome: Ongoing - Unchanged     Problem: VTE (Venous Thromboembolism)  Goal: VTE (Venous Thromboembolism) Symptom Resolution  Outcome: Ongoing - Unchanged     Problem: Seizure, Active Management  Goal: Absence of Seizure/Seizure-Related Injury  Outcome: Ongoing - Unchanged     Problem: Adjustment to Illness (Delirium)  Goal: Optimal Coping  Outcome: Ongoing - Unchanged     Problem: Altered Behavior (Delirium)  Goal: Improved Behavioral Control  Outcome: Ongoing - Unchanged     Problem: Attention and Thought Clarity Impairment (Delirium)  Goal: Improved Attention and Thought Clarity  Outcome: Ongoing - Unchanged     Problem: Sleep Disturbance (Delirium)  Goal: Improved Sleep  Outcome: Ongoing - Unchanged     Problem: Fall Injury Risk  Goal: Absence of Fall and Fall-Related Injury  Outcome: Ongoing - Unchanged     Problem: Skin Injury Risk Increased  Goal: Skin Health and Integrity  Outcome: Ongoing - Unchanged

## 2022-04-03 NOTE — Unmapped (Signed)
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 The series is complete.    Procedure #: 5/5     Labs:   HCT   Date Value Ref Range Status   04/03/2022 35.1 (L) 39.0 - 48.0 % Final   09/15/2014 38.4 (L) 41.0 - 53.0 % Final     Platelet   Date Value Ref Range Status   04/03/2022 153 150 - 450 10*9/L Final   09/15/2014 231 150 - 440 10*9/L Final       Premedications:  None    Procedure:  Following a timeout, the patient was accessed by the treating RN and good return blood flow was achieved.  A 1.0 plasma volume Therapeutic Plasma Exchange was performed.    Replacement Fluid: 5% albumin  Calcium replacement: None  Complications: None  Fluid balance: + 7 mL      The procedure was well tolerated.    For more details about this procedure, see the patient's encounter for this procedure, and go to the All Flowsheet Templates section and see the [facility] Plasma Exchange Treatment Flowsheet.    Vedia Pereyra, D.O.  Department of Laboratory Medicine and Pathology  Resident Physician, PGY-1  Pager: [phone]      Transfusion Medicine Attending Attestation:  I was present/available throughout the entire procedure. I reviewed the resident's note and agree with the resident's findings. For questions regarding the procedure, please contact the Transfusion Medicine resident on call.    Paulla Fore, MD  Transfusion Medicine Service

## 2022-04-03 NOTE — Unmapped (Signed)
Austin Banks is here for TPE.   Accessed R side apheresis cath Red and Blue Lumens without difficulty; blood return observed.   Pt is AO x 4, ambulatory, vital signs are stable.   Pt denies health changes or symptoms since last treatment.   Treatment completed without complications.     Post procedure instructions given. VSS, NAD noted. Sitter at bedside and report to floor nurse.
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
## 2022-04-03 NOTE — Unmapped (Signed)
Neurocritical Care Daily Progress Note     CODE STATUS:    Code Status: Full Code    HCDM (patient stated preference): [place], [place] - Father - [phone]    HCDM (patient stated preference): [place], [place] - Mother - [phone]  I discussed and confirmed Code Status with patient or HCDM    Date of service: 04/03/2022  Hospital Day:  LOS: 21 days        HPI     Austin Banks is a 31 y.o. 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 The patient was admitted to the Neurology floor service for further workup. The patient was placed on EEG monitoring but was negative for seizures. The patient was also noted to have an increase in GAD 65 levels to 163 (from 40.4 in 2021). Given this increase, PLEX treatment was planned. However, since admission, the patient has been increasingly agitated and would not be compliant with PLEX treatment. The patient is now being admitted to the NSICU for possible conscious sedation for catheter placement and subsequent PLEX treatment.     Assessment and Plan     Austin Banks is a 31 y.o. male admitted to NSICU with a diagnosis of GAD 33 associated seizures and encephalopathy    24 hour events: PLEX today    Neuro:  Mobility goal: 2 (HOB elevation)    **Seizures  - known hx of seizures; recent increase in frequency  - seizure semiology (per neurology notes from previous admissions): Begins with right eye deviation, left facial clonic jerking, eye blinking, mumbling. LUE flexed with coordinated back and forth movements of his bilateral lower extremities and drool from the left side of his mouth. All movement abruptly ceases at ictal offset with pelvic thrusting. Family also describes hand automatisms after shaking at times. Post-ictal confusion lasting up to 20 minutes. 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  If needed, may utilize prn haldol as rescue  - seizure management as above  - previously encephalopathy workup unremarkable  - ENA/ANA pending    **Polysubstance abuse  - current marijuana and tobacco use, remote cocaine use  - continue thiamine, multivitamins, and folate  - monitor for signs of withdrawal    **Psychosis/Schizophrenia/Agitation  - increasingly agitated since admission to [facility]  - has required multiple psychiatric admissions in the past, most recently at [facility] holding unit in 04/2021 for behavioral problems  - Psychiatry following, appreciate recs  - continue scheduled and PRN haldol 2mg  BID, benadryl 1mg  BID, and lorazepam 1mg  BID  - propanolol 20mg  BID  - Has not required PRN since  the 4th ( except during PLEX)    Cardiovascular:  Admission weight: 77.1kg  Daily Weight: 77.3 kg (170 lb 6.7 oz)     Hemodynamic goals:  - MAP > 65  - SBP < 180  - transition vital signs to Q4 hours unless on Precedex or during PLEX    Pulmonary    - stable on room air    **tobacco abuse  - every day smoker, 10 pack years  - refusing nicotine supplementation  - smoking cessation counseling      FEN  Body mass index is 23.11 kg/m??.    GI prophylaxis: not indicated  Last BM Date: 04/03/22  Foley catheter: No foley in place.     **Nutrition  - advance diet as tolerated    **Glycemic control  - none needed    Heme/ID  Current DVT prophylaxis: SCDs, Lovenox 40 mg daily    - start chemical DVT prophylaxis given high risk    Labs on PLEX days    Current Access:         -  PIV x 2       - Arterial line: No arterial line present.       - Central venous line: Central line required for PLEX.    Tubes and drains:  Patient Lines/Drains/Airways Status       Active Active Lines, Drains, & Airways       Name Placement date Placement time Site Days    Peripheral IV 03/31/22 Anterior;Left Forearm 03/31/22  1800  Forearm  2    Hemodialysis Catheter 04/01/22 Venovenous catheter Right Internal jugular 04/01/22  1426  Internal jugular  1                           Objective Data     All vital signs and resulted laboratory studies for the past 24 hours have been reviewed.  All ordered medications have been reviewed.    Temp:  [36.7 ??C (98.1 ??F)-37.2 ??C (99 ??F)] 37.1 ??C (98.7 ??F)  Heart Rate:  [68-100] 76  SpO2 Pulse:  [70-96] 73  Resp:  [14-19] 16  BP: (99-149)/(42-92) 132/92  MAP (mmHg):  [65-103] 103  SpO2:  [97 %-99 %] 98 %    Intake/Output Summary (Last 24 hours) at 04/03/2022 1100  Last data filed at 04/03/2022 0954  Gross per 24 hour   Intake 1607 ml   Output 2045 ml   Net -438 ml        Physical Exam     General Exam:  General: Lying in bed. Appears comfortable. No obvious distress.   ENT:  Mucous membranes moist. Oropharynx clear.  Cardiovascular:  Regular rate and rhythm.  No murmurs.   2+ radial pulses bilaterally.    Respiratory: Breathing is comfortable and unlabored.  Lungs are clear to ausculation bilaterally.    Gastrointestinal: Soft, nontender, nondistended.  Extremities: Warm and well-perfused. No cyanosis, clubbing, or edema.  Skin: No obvious rashes or ecchymoses.    Neurological Exam:  Mental Status  LOC: awake and alert  Orientation: Oriented x 4  Neglect: not present  Speech: normal  Language: UTA    Cranial Nerves  Pupils: PERRL  Gaze: Resting nystagmus  Face Motor: normal and symmetric  Cough: strong    Motor:   RUE: spontaneous and 5/5  LUE: spontaneous and 5/5  RLE: spontaneous and 5/5  LLE: spontaneous and 5/5     Sensory:  Sensory: UTA    Glasgow Coma Score  Motor: obeys commands = 6  Verbal: oriented = 5  Eyes: eyes open spontaneously = 4        DISPOSITION     The patient requires admission to the [facility] Intensive Care Unit for management of the above conditions.    Estimated Transfer Date:  5/10        BILLING     This is a subsequent evaluation of the patient.    Jenetta Loges, MD  Neurocritical Care Attending
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
## 2022-04-03 NOTE — Unmapped (Signed)
Neurology Inpatient Team B (NMB)  Daily Progress Note       Patient: Austin Banks  Code Status: Full Code  Level of Care: ICU status.   LOS: 21 days      Overnight Events & Subjective:     - final PLEx planned for today  - per discussion with outpatient neuro-immunologist, plan to begin Cytoxan once pre-medication labs have all resulted      Physical Exam:     General Exam:   General Appearance: sitting in bed comfortably.   Lungs: Normal work of breathing.    Extremities: No clubbing or cyanosis.     Neurological Exam:   Mental Status:   Alert, calm prior to rounds. At baseline has mild dysarthria.  This afternoon, he is angry and demanding to leave the hospital. Accuses provider of talking about him in a negative way while he was in the MRI scanner. He states if this is how it's going to be, I'm just going to end it. When asked what he means by this he states just send me back to [facility].       Cranial Nerves:   Mild L facial droop at rest (baseline).   EOM grossly intact.      Sensory exam:   Not done today. Previously light touch has been normal in the bilateral upper extremities and bilateral lower extremities.     Motor Exam:   Normal bulk   Moving all extremities spontaneously and equally.     Reflexes:   Deferred. Previously 1+ throughout bilaterally, Hoffmann's absent bilaterally, Toes down going bilaterally    Cerebellar/Coordination/Gai:  Observed taking a few steps around bed in hospital room, unassisted       Assessment/Plan:     Assessment: Austin Banks is a 31 y.o. 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      # Dysarthria - AMS - Imbalance - Difficulty Ambulating: Patient presented with 2 weeks of worsening balance and fluctuating mental status in the setting of recent medication changes, frequent marijuana use and falls associated with seizure and subsequent head injury. His symptoms are likely medication and drug related given the multiple CNS sedating meds he requires for seizure control with concurrent drug use. Other than the dysarthria which has improved and the multi-directional nystagmus, there are no other focal neurological findings on exam. With his history of medically refractory epilepsy and questionable compliance, cannot rule out subclinical events contributing to his fluctuating mental status as well, but EEG during admission has not revealed any seizures.    # GAD 65 c/b focal to bilateral tonic clonic seizures: Medically refractory, currently undergoing DBS vs VNS candidacy. Given one dose on IV VPA 500mg  and placed on IV maintenance as we await NG tube placement vs PO clearance. Patient endorses compliance with ASMs, however, he spends half the week with friends per father, partying and smoking marijuana and so compliance is questionable. He has had subtherapeutic levels of Lamictal in the past. Valproic acid was obtained on admission but collected after he received 500mg  IV. Last Rituxan dose was 4/24.      Workup:  Resulted:  - CTH: No acute abnormalities, On personal review, patient has atrophy greater than expected for age cerebellum>cerebral cortex  - CT C/A/P: No abnormal  lesions/masses  - CT cervical spine: No fracture or traumatic malalignment of the cervical spine.  - UA unremarkable  - VPA level 74.0  - Vitamin B12 wnl, RPR nonreactive  - Alcohol level (<10.0 4/19pm)  - Utox (+cannabinoids + benzos)  - cvEEG (03/14/22 0430 to 03/15/22 11:30 AM ): There were bilateral independent (L>>R) temporal epileptiform discharges indicating focal cortical irritation and increased potential for seizure activity over there regions. There was moderate encephalopathy which is non-specific in etiology. There are no seizures. EEG discontinued on 4/20; restarted on 5/2 x2 days with bitemporal sharp waves and R>L temporal slowing.  - HIV negative  - lamotrigine level 6.8 (4/20)  - clobazam level 50 (4/19)  - B1 level 113 (4/19)  - Discontinued onfi due to oversedation  - Discontinued Fycompa 2 mg at bedtime (s4/20, dc 4/27) given literature reports of increased agitation  - GAD 65 antibody elevated 163 (03/14/22)   - previously 40.4 in June 2021  - MRI brain with and without contrast done 5/3: R > L cerebellar atrophy disproportionate for age. No abnormal enhancement  - ANA/ENA negative  - s/p PLEx x5 (5/2 - 5/9)    Plan:  - Continue home lamictal 200mg  BID  - Continue IV valproate to 500mg  qAM + 1000mg  nightly at 6pm for seizure and behavioral management   - Continue haldol 2mg  bid standing dose, benadryl 12.5 mg bid standing dose, lorazepam 1mg  bid standing dose   - Continue cenobamate 400mg  at bedtime   - Repeat GAD 65 Ab 10 days after last PLEX session  - Cytoxan pre-labs in process (CBC w/ diff, UA, CMP, Albumin, Hep B, Hep C serologies, TB test  - Once labs have all resulted, plan for Cyclophosphamide 750 mg/m2 followed by monthly cycles of cyclophosphamide in the same dose (with MESNA) for 3-6 months (Cytoxan for AI Encephalitis doi: 10.1177/[id number])   - After 3-6 months, likely plan to switch to Actemra  - MESNA: If cyclophosphamide is given as an intravenous bolus, the intravenous dose of mesna is 20% of the dose of the antineoplastic on a weight for weight basis given on 3 occasions over 15 to 30 minutes at intervals of 4 hours beginning at the same time as the antineoplastic injection; thus the total dose of mesna is equivalent to 60% of the antineoplastic given. This regimen is repeated each time the antineoplastic is used. Each individual dose of mesna may be increased to 40% of the dose of the antineoplastic and given 4 times at intervals of 3 hours for children and patients at high risk of urotoxicity; in such cases the total dose of mesna is equivalent to 160% of the antineoplastic given. The oral dose of mesna is 40% of the dose of the antineoplastic given on 3 occasions at intervals of 4 hours beginning 2 hours before the antineoplastic injection; thus a total dose of mesna equivalent to 120% of the antineoplastic is given. Alternatively, the initial dose of mesna may be given intravenously (20% of the dose of the antineoplastic), followed by two oral doses (each 40% of the dose of the antineoplastic) given 2 and 6 hours after the intravenous dose. Any of these regimens may be used if cyclophosphamide is given orally.   ([employer] S. Mesna - [place]: The Complete Drug Reference. [place]: [employer]. Electronic version. http://www.morales.org/.)     # Dysarthria - Excessive Drooling - C/f Aspiration: Patient had excessive coughing with liquid coming out his nose during bedside swallow eval and thus failed.  Possibly medication related as Onfi was initiated recently (02/17/22). Father also notes dysarthria after his marijuana use. CXR suggested mild left lower lobe consolidation c/f possible aspiration PNA but lungs clear on CT chest. NG tube placed with continuous tube feeds upon admission but patient able to tolerate regular diet since 4/21.  - Nutrition consulted; appreciate recs    # Weight Loss - Poor Appetite: Patients father reports ~ 30lb weight loss over the past 6 months. Weight was 87kg in 06/2021, 77.7kg this admission. With his know GAD 65 positivity and tobacco abuse, malignancy screening with CT chest, abdomen and pelvis was obtained and unremarkable. Poor appetite could also be medication and/or tobacco use related.  - Nutrition consulted; appreciate recs     # Psychosis - Schizophrenia: IVC currently in place. Behavioral response called for increasing combativeness in attempt to elope requiring IM Ativan 2mg , followed by Haldol. Patient seems emotionally labile-appearing calm amenable to admission on initial evaluation but after his father left the bedside he became agitated, loud, refusing admission and encroaching on this examiner's personal space. Patient has required multiple psychiatric admissions in the past most recently held in our Psych holding unit in 04/2021 for behavioral problems.   -- Psychiatry consulted, appreciate recs  - IV Depakote 500mg  qAM + 1000mg  nightly (at 6pm) as above   - Depakote levels:   - 4/25 subtherapeutic 38.6   - 4/28 therapeutic at 68.1   - 4/29 subtherapeutic at 48.7              - 5/2 therapeutic at 55.3              - 5/5 therapeutic at 60.6  - Continue haldol 2mg  BID standing dose (9AM, 6PM)  - Continue Benadryl 12.5 mg BID standing dose (9AM, 6PM)  - Continue lorazepam 1mg  BID standing dose (9AM, 6PM)  - Continue propranolol 20mg  BID in place of home propranolol XR 60mg  daily  - CONTINUE home lamictal 200 mg PO BID  - HOLD home trazodone 200 mg nightly  -- Agitation PRNs:   -- Continue Haldol 2mg  IV bid PRN   -- Continue Ativan 1mg  IV bid PRN   -- Continue Benadryl 12.5mg  IV bid PRN  -- continue thiamine PO/NGT supplementation daily   -- continue folate supplementation and multivitamin daily   -- continue telemetry monitoring    - EKG to monitor QTc (401 on 5/8)     # Polysubstance Use: Seen in [facility]. He has been referred to smoking cessation program. Patient endorses daily tobacco use and marijuana use (when visiting friends weekly). Denies recent cocaine use. Utox (+cannabinoids + benzos).  - Nicotine patch daily though patient refuses.      Discharge Planning:   - Case management: consulted. Recommendations appreciated.  - Social work: N/A  - PT: consulted, 5xL  - OT: consulted, 3x  - SLP: 3x  - Expected Discharge Disposition: TBD. Referral for [facility] placed. Expanded referrals statewide for inpatient psych admission.      Checklist:  - Diet: Regular diet  - IV fluids: none  - Bowel Regimen: senna  - GI PPX: No GI indications  - DVT PPX: Lovenox 40 mg [place] daily  - Lines/Access:   Patient Lines/Drains/Airways Status       Active Active Lines, Drains, & Airways       Name Placement date Placement time Site Days    Peripheral IV 03/31/22 Anterior;Left Forearm 03/31/22  1800  Forearm  2                  -  Foley: No     Patient seen and discussed with attending physician, Dr. Dan Humphreys who is in agreement with the assessment and plan.      This patient is co-managed with the NSICU Service. Under this co-management agreement, the NSICU serves as the first-call team. Please page the NSICU team at [phone] for any questions/concerns.  Neurology Team B resident can be reached at [phone].    Alwyn Pea, MD   Neurology, PGY-2     Data Review:     Contact Information:  Family contact: Friend, Dorfman (Father)   [phone] (Mobile)    PCP: [facility]    Medications:  Scheduled medications:    cenobamate  2 tablet Oral At bedtime    calcium chloride        calcium chloride        diphenhydrAMINE  12.5 mg Intravenous BID    valproate sodium  1,000 mg Intravenous Nightly    Or    divalproex ER  1,000 mg Oral Nightly    valproate sodium  500 mg Intravenous Daily before breakfast    Or    divalproex ER  500 mg Oral Daily before breakfast    enoxaparin (LOVENOX) injection  40 mg Subcutaneous Q24H SCH    folic acid  1 mg Oral Daily    haloperidol lactate  2 mg Intravenous BID    And    LORazepam  1 mg Intravenous BID    heparin, porcine (PF)        heparin, porcine (PF)        lamoTRIgine  200 mg Oral BID    melatonin  3 mg Oral QPM    multivitamins, therapeutic with minerals  1 tablet Oral Daily    polyethylene glycol  17 g Oral Daily    propranoloL  20 mg Oral BID    senna  2 tablet Oral Nightly    thiamine mononitrate (vit B1)  100 mg Oral Daily     Continuous infusions:         PRN medications: acetaminophen, anticoagulant citrate dextrose solution A, anticoagulant citrate dextrose solution A, anticoagulant citrate dextrose solution A, haloperidol lactate **AND** diphenhydrAMINE, heparin, porcine (PF), LORazepam, oxyCODONE    24 hour vital signs:  Temp:  [36.8 ??C (98.2 ??F)-37.2 ??C (99 ??F)] 36.9 ??C (98.5 ??F)  Heart Rate:  [68-93] 83  SpO2 Pulse:  [70-82] 80  Resp:  [14-19] 16  BP: (99-138)/(42-98) 124/75  MAP (mmHg):  [65-105] 88  SpO2:  [97 %-100 %] 98 %    Ins and Outs:  I/O this shift:  In: 1445 [P.O.:1438; Other:7]  Out: 1900 [Urine:1900]    Laboratory values:  All Labs Last 24hrs:   Recent Results (from the past 24 hour(s))   ECG 12 Lead    Collection Time: 04/02/22  4:32 PM   Result Value Ref Range    EKG Systolic BP  mmHg    EKG Diastolic BP  mmHg    EKG Ventricular Rate 80 BPM    EKG Atrial Rate 80 BPM    EKG P-R Interval 140 ms    EKG QRS Duration 82 ms    EKG Q-T Interval 364 ms    EKG QTC Calculation 419 ms    EKG Calculated P Axis 67 degrees    EKG Calculated R Axis 74 degrees    EKG Calculated T Axis 57 degrees    QTC Fredericia 401 ms   Urinalysis with Microscopy    Collection  Time: 04/02/22  8:30 PM   Result Value Ref Range    Color, UA Yellow     Clarity, UA Clear     Specific Gravity, UA 1.027 1.003 - 1.030    pH, UA 7.0 5.0 - 9.0    Leukocyte Esterase, UA Negative Negative    Nitrite, UA Negative Negative    Protein, UA Trace (A) Negative    Glucose, UA Negative Negative    Ketones, UA Negative Negative    Urobilinogen, UA <2.0 mg/dL <1.6 mg/dL    Bilirubin, UA Negative Negative    Blood, UA Negative Negative    RBC, UA 2 <=3 /HPF    WBC, UA 1 <=2 /HPF    Squam Epithel, UA <1 0 - 5 /HPF    Bacteria, UA None Seen None Seen /HPF    Mucus, UA Occasional (A) None Seen /HPF   Comprehensive Metabolic Panel    Collection Time: 04/03/22  3:53 AM   Result Value Ref Range    Sodium 141 135 - 145 mmol/L    Potassium 4.5 3.4 - 4.8 mmol/L    Chloride 107 98 - 107 mmol/L    CO2 27.0 20.0 - 31.0 mmol/L    Anion Gap 7 5 - 14 mmol/L    BUN 7 (L) 9 - 23 mg/dL    Creatinine 1.09 6.04 - 1.10 mg/dL    BUN/Creatinine Ratio 9     eGFR CKD-EPI (2021) Male >90 >=60 mL/min/1.23m2    Glucose 92 70 - 179 mg/dL    Calcium 8.6 (L) 8.7 - 10.4 mg/dL    Albumin 3.8 3.4 - 5.0 g/dL    Total Protein 5.1 (L) 5.7 - 8.2 g/dL    Total Bilirubin 0.2 (L) 0.3 - 1.2 mg/dL    AST 15 <=54 U/L    ALT 12 10 - 49 U/L    Alkaline Phosphatase 20 (L) 46 - 116 U/L   CBC w/ Differential    Collection Time: 04/03/22  3:53 AM   Result Value Ref Range    WBC 4.1 3.6 - 11.2 10*9/L    RBC 3.79 (L) 4.26 - 5.60 10*12/L    HGB 12.1 (L) 12.9 - 16.5 g/dL    HCT 09.8 (L) 11.9 - 48.0 %    MCV 92.6 77.6 - 95.7 fL    MCH 32.0 25.9 - 32.4 pg    MCHC 34.6 32.0 - 36.0 g/dL    RDW 14.7 82.9 - 56.2 %    MPV 8.6 6.8 - 10.7 fL    Platelet 153 150 - 450 10*9/L    Neutrophils % 32.6 %    Lymphocytes % 43.6 %    Monocytes % 17.0 %    Eosinophils % 6.3 %    Basophils % 0.5 %    Absolute Neutrophils 1.3 (L) 1.8 - 7.8 10*9/L    Absolute Lymphocytes 1.8 1.1 - 3.6 10*9/L    Absolute Monocytes 0.7 0.3 - 0.8 10*9/L    Absolute Eosinophils 0.3 0.0 - 0.5 10*9/L    Absolute Basophils 0.0 0.0 - 0.1 10*9/L         Imaging:  Pertinent imaging discussed in the A/P section

## 2022-04-04 LAB — HEPATITIS B SURFACE ANTIGEN: HEPATITIS B SURFACE ANTIGEN: NONREACTIVE

## 2022-04-04 LAB — HEPATITIS C ANTIBODY: HEPATITIS C ANTIBODY: NONREACTIVE

## 2022-04-04 LAB — TB MITOGEN: TB MITOGEN VALUE: 10

## 2022-04-04 LAB — TB AG1: TB AG1 VALUE: 0.05

## 2022-04-04 LAB — HIV ANTIGEN/ANTIBODY COMBO: HIV ANTIGEN/ANTIBODY COMBO: NONREACTIVE

## 2022-04-04 LAB — HEPATITIS B SURFACE ANTIBODY
HEPATITIS B SURFACE ANTIBODY QUANT: 37.97 m[IU]/mL — ABNORMAL HIGH (ref <8.00)
HEPATITIS B SURFACE ANTIBODY: REACTIVE — AB

## 2022-04-04 LAB — HEPATITIS B CORE ANTIBODY, IGM: HEPATITIS B CORE IGM ANTIBODY: NONREACTIVE

## 2022-04-04 LAB — QUANTIFERON TB GOLD PLUS
QUANTIFERON ANTIGEN 1 MINUS NIL: 0.01 [IU]/mL
QUANTIFERON ANTIGEN 2 MINUS NIL: 0.03 [IU]/mL
QUANTIFERON MITOGEN: 9.96 [IU]/mL
QUANTIFERON TB GOLD PLUS: NEGATIVE
QUANTIFERON TB NIL VALUE: 0.04 [IU]/mL

## 2022-04-04 LAB — TB NIL: TB NIL VALUE: 0.04

## 2022-04-04 LAB — HEPATITIS B CORE ANTIBODY, TOTAL: HEPATITIS B CORE TOTAL ANTIBODY: NONREACTIVE

## 2022-04-04 LAB — TB AG2: TB AG2 VALUE: 0.07

## 2022-04-04 MED ADMIN — diphenhydrAMINE (BENADRYL) injection: 12.5 mg | INTRAVENOUS | @ 22:00:00

## 2022-04-04 MED ADMIN — divalproex ER (DEPAKOTE ER) extended released 24 hr tablet 500 mg: 500 mg | ORAL | @ 13:00:00

## 2022-04-04 MED ADMIN — ondansetron (ZOFRAN) 4 mg/2 mL injection: @ 21:00:00 | Stop: 2022-04-04

## 2022-04-04 MED ADMIN — LORazepam (ATIVAN) injection 1 mg: 1 mg | INTRAVENOUS | @ 13:00:00 | Stop: 2022-04-04

## 2022-04-04 MED ADMIN — folic acid (FOLVITE) tablet 1 mg: 1 mg | ORAL | @ 13:00:00

## 2022-04-04 MED ADMIN — multivitamins, therapeutic with minerals tablet 1 tablet: 1 | ORAL | @ 13:00:00

## 2022-04-04 MED ADMIN — LORazepam (ATIVAN) injection 1 mg: 1 mg | INTRAVENOUS | @ 22:00:00

## 2022-04-04 MED ADMIN — haloperidol lactate (HALDOL) injection 2 mg: 2 mg | INTRAVENOUS | @ 13:00:00 | Stop: 2022-04-04

## 2022-04-04 MED ADMIN — LORazepam (ATIVAN) injection 2 mg: 2 mg | INTRAVENOUS | @ 08:00:00 | Stop: 2022-04-04

## 2022-04-04 MED ADMIN — haloperidol lactate (HALDOL) injection 3 mg: 3 mg | INTRAVENOUS | @ 22:00:00

## 2022-04-04 MED ADMIN — diphenhydrAMINE (BENADRYL) injection: 12.5 mg | INTRAVENOUS | @ 13:00:00

## 2022-04-04 MED ADMIN — thiamine mononitrate (vit B1) tablet 100 mg: 100 mg | ORAL | @ 13:00:00

## 2022-04-04 MED ADMIN — propranoloL (INDERAL) tablet 20 mg: 20 mg | ORAL | @ 13:00:00

## 2022-04-04 MED ADMIN — lamoTRIgine (LaMICtal) tablet 200 mg: 200 mg | ORAL | @ 13:00:00

## 2022-04-04 NOTE — Unmapped (Signed)
Neuro: A&O*4, FC*4. Patient has nystagmus. Has hallucinations. Has pleseant today  Cardiac: NSR, no issues with SBP  Resp: RA  Heme: Refused SCDs  GI: Regular diet, poor appetite today. One occurrence of emesis  Renal/Endo: Voids via urinal  ID/Lines: Two PIVs, afebrile    Problem: Adult Inpatient Plan of Care  Goal: Plan of Care Review  Outcome: Ongoing - Unchanged  Goal: Patient-Specific Goal (Individualized)  Outcome: Ongoing - Unchanged  Goal: Absence of Hospital-Acquired Illness or Injury  Outcome: Ongoing - Unchanged  Goal: Optimal Comfort and Wellbeing  Outcome: Ongoing - Unchanged  Goal: Readiness for Transition of Care  Outcome: Ongoing - Unchanged  Goal: Rounds/Family Conference  Outcome: Ongoing - Unchanged     Problem: Seizure Disorder Comorbidity  Goal: Maintenance of Seizure Control  Outcome: Ongoing - Unchanged     Problem: Self-Care Deficit  Goal: Improved Ability to Complete Activities of Daily Living  Outcome: Ongoing - Unchanged     Problem: Violence Risk or Actual  Goal: Anger and Impulse Control  Outcome: Ongoing - Unchanged     Problem: VTE (Venous Thromboembolism)  Goal: VTE (Venous Thromboembolism) Symptom Resolution  Outcome: Ongoing - Unchanged     Problem: Seizure, Active Management  Goal: Absence of Seizure/Seizure-Related Injury  Outcome: Ongoing - Unchanged     Problem: Adjustment to Illness (Delirium)  Goal: Optimal Coping  Outcome: Ongoing - Unchanged     Problem: Altered Behavior (Delirium)  Goal: Improved Behavioral Control  Outcome: Ongoing - Unchanged     Problem: Attention and Thought Clarity Impairment (Delirium)  Goal: Improved Attention and Thought Clarity  Outcome: Ongoing - Unchanged     Problem: Sleep Disturbance (Delirium)  Goal: Improved Sleep  Outcome: Ongoing - Unchanged     Problem: Fall Injury Risk  Goal: Absence of Fall and Fall-Related Injury  Outcome: Ongoing - Unchanged     Problem: Skin Injury Risk Increased  Goal: Skin Health and Integrity  Outcome: Ongoing - Unchanged

## 2022-04-04 NOTE — Unmapped (Signed)
Neurology Inpatient Team B (NMB)  Daily Progress Note       Patient: Austin Banks  Code Status: Full Code  Level of Care: ICU status.   LOS: 22 days      Overnight Events & Subjective:     - agitated and refused meds overnight. Afraid EKG leads were shocking him. Inappropriate behaviors ongoing. Patient ultimately exited ICU and was brought back to room by police. Now in 4 point restraints  - cytoxan labs still pending  - parents updated via phone this afternoon  - will come out of ICU to floor status      Physical Exam:     General Exam:   General Appearance: lying in bed. In NAD   Lungs: Normal work of breathing.    Extremities: No clubbing or cyanosis.     Neurological Exam:   Mental Status:   Observed lying in bed with eyes open     Cranial Nerves:   Mild L facial droop at rest (baseline).   EOM grossly intact.      Sensory exam:   Not done today. Previously light touch has been normal in the bilateral upper extremities and bilateral lower extremities.     Motor Exam:   Normal bulk   Moving all extremities spontaneously and equally.     Reflexes:   Deferred. Previously 1+ throughout bilaterally, Hoffmann's absent bilaterally, Toes down going bilaterally    Cerebellar/Coordination/Gai:  Observed taking a few steps around bed in hospital room, unassisted       Assessment/Plan:     Assessment: Rigley Niess is a 31 y.o. 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      # Dysarthria - AMS - Imbalance - Difficulty Ambulating: Patient presented with 2 weeks of worsening balance and fluctuating mental status in the setting of recent medication changes, frequent marijuana use and falls associated with seizure and subsequent head injury. His symptoms are likely medication and drug related given the multiple CNS sedating meds he requires for seizure control with concurrent drug use. Other than the dysarthria which has improved and the multi-directional nystagmus, there are no other focal neurological findings on exam. With his history of medically refractory epilepsy and questionable compliance, cannot rule out subclinical events contributing to his fluctuating mental status as well, but EEG during admission has not revealed any seizures.    # GAD 65 c/b focal to bilateral tonic clonic seizures: Medically refractory, currently undergoing DBS vs VNS candidacy. Given one dose on IV VPA 500mg  and placed on IV maintenance as we await NG tube placement vs PO clearance. Patient endorses compliance with ASMs, however, he spends half the week with friends per father, partying and smoking marijuana and so compliance is questionable. He has had subtherapeutic levels of Lamictal in the past. Valproic acid was obtained on admission but collected after he received 500mg  IV. Last Rituxan dose was 4/24.      Workup:  Resulted:  - CTH: No acute abnormalities, On personal review, patient has atrophy greater than expected for age cerebellum>cerebral cortex  - CT C/A/P: No abnormal lesions/masses  - CT cervical spine: No fracture or traumatic malalignment of the cervical spine.  - UA unremarkable  - VPA level 74.0  - Vitamin B12 wnl, RPR nonreactive  -  Alcohol level (<10.0 4/19pm)  - Utox (+cannabinoids + benzos)  - cvEEG (03/14/22 0430 to 03/15/22 11:30 AM ): There were bilateral independent (L>>R) temporal epileptiform discharges indicating focal cortical irritation and increased potential for seizure activity over there regions. There was moderate encephalopathy which is non-specific in etiology. There are no seizures. EEG discontinued on 4/20; restarted on 5/2 x2 days with bitemporal sharp waves and R>L temporal slowing.  - HIV negative  - lamotrigine level 6.8 (4/20)  - clobazam level 50 (4/19)  - B1 level 113 (4/19)  - Discontinued onfi due to oversedation  - Discontinued Fycompa 2 mg at bedtime (s4/20, dc 4/27) given literature reports of increased agitation  - GAD 65 antibody elevated 163 (03/14/22)   - previously 40.4 in June 2021  - MRI brain with and without contrast done 5/3: R > L cerebellar atrophy disproportionate for age. No abnormal enhancement  - ANA/ENA negative  - s/p PLEx x5 (5/2 - 5/9)    Plan:  - Continue home lamictal 200mg  BID  - Continue IV valproate to 500mg  qAM + 1000mg  nightly at 6pm for seizure and behavioral management   - Continue haldol 2mg  bid standing dose, benadryl 12.5 mg bid standing dose, lorazepam 1mg  bid standing dose   - Continue cenobamate 400mg  at bedtime   - Repeat GAD 65 Ab 10 days after last PLEX session (5/19)  - Cytoxan pre-labs in process (CBC w/ diff, UA, CMP, Albumin, Hep B, Hep C serologies, TB test  - Once labs have all resulted, plan for Cyclophosphamide 750 mg/m2 followed by monthly cycles of cyclophosphamide in the same dose (with MESNA) for 3-6 months (see 5/9 progress note for references and MESNA dosing details)  - After 3-6 months, likely plan to switch to Actemra    # Dysarthria - Excessive Drooling - C/f Aspiration: Patient had excessive coughing with liquid coming out his nose during bedside swallow eval and thus failed. Possibly medication related as Onfi was initiated recently (02/17/22). Father also notes dysarthria after his marijuana use. CXR suggested mild left lower lobe consolidation c/f possible aspiration PNA but lungs clear on CT chest. NG tube placed with continuous tube feeds upon admission but patient able to tolerate regular diet since 4/21.  - Nutrition consulted; appreciate recs    # Weight Loss - Poor Appetite: Patients father reports ~ 30lb weight loss over the past 6 months. Weight was 87kg in 06/2021, 77.7kg this admission. With his know GAD 65 positivity and tobacco abuse, malignancy screening with CT chest, abdomen and pelvis was obtained and unremarkable. Poor appetite could also be medication and/or tobacco use related.  - Nutrition consulted; appreciate recs     # Psychosis - Schizophrenia: IVC currently in place. Behavioral response called for increasing combativeness in attempt to elope requiring IM Ativan 2mg , followed by Haldol. Patient seems emotionally labile-appearing calm amenable to admission on initial evaluation but after his father left the bedside he became agitated, loud, refusing admission and encroaching on examiner's personal space. Patient has required multiple psychiatric admissions in the past most recently held in our Psych holding unit in 04/2021 for behavioral problems. Of note, patient has ongoing significant behavioral issues including paranoia and attempts to leave hospital, requiring 4 point restraints. His GAD-65 disease is not likely the primary etiology of his psychiatric symptoms. Psych following; will reach out to them today regarding worsening of symptoms overnight.    -- Psychiatry consulted, appreciate recs  - IV Depakote 500mg  qAM + 1000mg  nightly (at  6pm) as above   - Depakote levels:   - 4/25 subtherapeutic 38.6   - 4/28 therapeutic at 68.1   - 4/29 subtherapeutic at 48.7              - 5/2 therapeutic at 55.3              - 5/5 therapeutic at 60.6  - Continue haldol 2mg  BID standing dose (9AM, 6PM)  - Continue Benadryl 12.5 mg BID standing dose (9AM, 6PM)  - Continue lorazepam 1mg  BID standing dose (9AM, 6PM)  - Continue propranolol 20mg  BID in place of home propranolol XR 60mg  daily  - CONTINUE home lamictal 200 mg PO BID  - HOLD home trazodone 200 mg nightly  -- Agitation PRNs:   -- Continue Haldol 2mg  IV bid PRN   -- Continue Ativan 1mg  IV bid PRN   -- Continue Benadryl 12.5mg  IV bid PRN  -- continue thiamine PO/NGT supplementation daily   -- continue folate supplementation and multivitamin daily   -- continue telemetry monitoring    - EKG to monitor QTc (401 on 5/8)     # Polysubstance Use: Seen in [facility]. He has been referred to smoking cessation program. Patient endorses daily tobacco use and marijuana use (when visiting friends weekly). Denies recent cocaine use. Utox (+cannabinoids + benzos).  - Nicotine patch daily though patient refuses.      Discharge Planning:   - Case management: consulted. Recommendations appreciated.  - Social work: N/A  - PT: consulted, 5xL  - OT: consulted, 3x  - SLP: 3x  - Expected Discharge Disposition: TBD. Referral for [facility] placed. Expanded referrals statewide for inpatient psych admission.      Checklist:  - Diet: Regular diet  - IV fluids: none  - Bowel Regimen: senna  - GI PPX: No GI indications  - DVT PPX: Lovenox 40 mg Gilchrist daily  - Lines/Access:   Patient Lines/Drains/Airways Status       Active Active Lines, Drains, & Airways       Name Placement date Placement time Site Days    Peripheral IV 03/31/22 Anterior;Left Forearm 03/31/22  1800  Forearm  3    Peripheral IV 04/04/22 Anterior;Distal;Right;Upper Antecubital 04/04/22  0400  Antecubital  less than 1                  - Foley: No     Patient seen and discussed with attending physician, Dr. Dan Humphreys who is in agreement with the assessment and plan.      This patient is co-managed with the NSICU Service. Under this co-management agreement, the NSICU serves as the first-call team. Please page the NSICU team at [phone] for any questions/concerns.  Neurology Team B resident can be reached at [phone].    Alwyn Pea, MD   Neurology, PGY-2     Data Review:     Contact Information:  Family contact: Kein, Carlberg (Father)   [phone] (Mobile)    PCP: [facility]    Medications:  Scheduled medications:    cenobamate  400 mg Oral At bedtime    calcium chloride        calcium chloride        diphenhydrAMINE  12.5 mg Intravenous BID    diphenhydrAMINE  25 mg Intravenous Once    valproate sodium  1,000 mg Intravenous Nightly    Or    divalproex ER 1,000 mg Oral Nightly    valproate sodium  500  mg Intravenous Daily before breakfast    Or    divalproex ER  500 mg Oral Daily before breakfast    enoxaparin (LOVENOX) injection  40 mg Subcutaneous Q24H SCH    folic acid  1 mg Oral Daily    haloperidol lactate        haloperidol lactate  2 mg Intravenous BID    And    LORazepam  1 mg Intravenous BID    haloperidol lactate  5 mg Intravenous Once    heparin, porcine (PF)        heparin, porcine (PF)        lamoTRIgine  200 mg Oral BID    LORazepam  2 mg Intravenous Once    melatonin  3 mg Oral QPM    multivitamins, therapeutic with minerals  1 tablet Oral Daily    polyethylene glycol  17 g Oral Daily    propranoloL  20 mg Oral BID    senna  2 tablet Oral Nightly    thiamine mononitrate (vit B1)  100 mg Oral Daily     Continuous infusions:         PRN medications: acetaminophen, haloperidol lactate **AND** diphenhydrAMINE, LORazepam    24 hour vital signs:  Temp:  [36.5 ??C (97.7 ??F)-37.1 ??C (98.7 ??F)] 36.7 ??C (98.1 ??F)  Heart Rate:  [74-93] 74  SpO2 Pulse:  [72-86] 74  Resp:  [14-20] 14  BP: (91-132)/(48-98) 111/48  MAP (mmHg):  [65-105] 65  SpO2:  [96 %-100 %] 100 %    Ins and Outs:  No intake/output data recorded.    Laboratory values:  All Labs Last 24hrs:   Recent Results (from the past 24 hour(s))   ECG 12 Lead    Collection Time: 04/04/22  4:01 AM   Result Value Ref Range    EKG Systolic BP  mmHg    EKG Diastolic BP  mmHg    EKG Ventricular Rate 112 BPM    EKG Atrial Rate 112 BPM    EKG P-R Interval 130 ms    EKG QRS Duration 78 ms    EKG Q-T Interval 324 ms    EKG QTC Calculation 442 ms    EKG Calculated P Axis 62 degrees    EKG Calculated R Axis 63 degrees    EKG Calculated T Axis 35 degrees    QTC Fredericia 399 ms           Imaging:  Pertinent imaging discussed in the A/P section

## 2022-04-04 NOTE — Unmapped (Signed)
Neurocritical Care Daily Progress Note     CODE STATUS:    Code Status: Full Code    HCDM (patient stated preference): Austin Banks, Austin Banks - Father - [phone]    HCDM (patient stated preference): Austin Banks, Austin Banks - Mother - [phone]  I discussed and confirmed Code Status with patient or HCDM    Date of service: 04/04/2022  Hospital Day:  LOS: 22 days        HPI     Austin Banks is a 31 y.o. male  with PMH of GAD 67 associated focal onset epilepsy on monthly IVIG, polysubstance use (currently marijuana and tobacco; remote cocaine use) schizophrenia, depression and anxiety who initially presented to [facility] ED on 4/18 with two recent seizures along with gait imbalance and slurred speech following a head injury. The patient was admitted to the Neurology floor service for further workup. The patient was placed on EEG monitoring but was negative for seizures. The patient was also noted to have an increase in GAD 65 levels to 163 (from 40.4 in 2021). Given this increase, PLEX treatment was planned. However, since admission, the patient has been increasingly agitated and would not be compliant with PLEX treatment. The patient is now being admitted to the NSICU for possible conscious sedation for catheter placement and subsequent PLEX treatment.     Assessment and Plan     Austin Banks is a 31 y.o. male admitted to NSICU with a diagnosis of GAD 44 associated seizures and encephalopathy    24 hour events: tolerated PLEX with HD catheter removal yesterday, transfer  Neuro:  Mobility goal: 2 (HOB elevation)    **Seizures  - known hx of seizures; recent increase in frequency  - seizure semiology (per neurology notes from previous admissions): Begins with right eye deviation, left facial clonic jerking, eye blinking, mumbling. LUE flexed with coordinated back and forth movements of his bilateral lower extremities and drool from the left side of his mouth. All movement abruptly ceases at ictal offset with pelvic thrusting. Family also describes hand automatisms after shaking at times. Post-ictal confusion lasting up to 20 minutes. - previous EEG monitoring from negative for seizures  - - 2HELPS2B Score components: Sporadic Epileptiform Discharges = 1, Brief Ictal Appearing Rhythmic Discharges (BIRDs) = 2  and Prior Clinical Seizures or Hx of Epilepsy = 1  Total 2HELPS2B Seizure Risk Score: 4  - No seizures captured on EEG  - continue cenobamate 400mg  at bedtime,  lamictal 200mg  BID and valproate 500mg  qAM + 1g nightly  - 5/2 valproate level 55.3    **GAD 65 Encephalitis  - likely d/t elevated GAD 65   - GAD 65 elevated at 163 (from 40.4 in 2021)  - previously received monthly IVIG treatments  - PLEX treatment (5/5 session, on 5/10) - pre-medicate with Benadryl and Lorazepam today.  If needed, Austin utilize prn haldol as rescue  - seizure management as above  - previously encephalopathy workup unremarkable  - ENA/ANA pending    **Polysubstance abuse  - current marijuana and tobacco use, remote cocaine use  - continue thiamine, multivitamins, and folate  - monitor for signs of withdrawal    **Psychosis/Schizophrenia/Agitation  - increasingly agitated since admission to [facility]  - has required multiple psychiatric admissions in the past, most recently at [facility] Psych holding unit in 04/2021 for behavioral problems  - Psychiatry following, appreciate recs  - continue scheduled and PRN haldol 2mg  BID, benadryl 1mg  BID, and lorazepam 1mg  BID  - propanolol 20mg  BID  Cardiovascular:  Admission weight: 77.1kg  Daily Weight: 77.3 kg (170 lb 6.7 oz)     Hemodynamic goals:  - MAP > 65  - SBP < 180  - transition vital signs to Q4 hours unless on Precedex or during PLEX    Pulmonary    - stable on room air    **tobacco abuse  - every day smoker, 10 pack years  - refusing nicotine supplementation  - smoking cessation counseling      FEN  Body mass index is 23.11 kg/m??.    GI prophylaxis: not indicated  Last BM Date: 04/03/22  Foley catheter: No foley in place.     **Nutrition  - advance diet as tolerated    **Glycemic control  - none needed    Heme/ID  Current DVT prophylaxis: SCDs, Lovenox 40 mg daily    - start chemical DVT prophylaxis given high risk    Labs on PLEX days    Current Access:         -  PIV x 2       - Arterial line: No arterial line present.       - Central venous line: No central line present.    Tubes and drains:  Patient Lines/Drains/Airways Status     Active Active Lines, Drains, & Airways     Name Placement date Placement time Site Days    Peripheral IV 03/31/22 Anterior;Left Forearm 03/31/22  1800  Forearm  3    Peripheral IV 04/04/22 Anterior;Distal;Right;Upper Antecubital 04/04/22  0400  Antecubital  less than 1                       Objective Data     All vital signs and resulted laboratory studies for the past 24 hours have been reviewed.  All ordered medications have been reviewed.    Temp:  [36.5 ??C (97.7 ??F)-36.9 ??C (98.5 ??F)] 36.5 ??C (97.7 ??F)  Heart Rate:  [74-93] 81  SpO2 Pulse:  [72-86] 74  Resp:  [14-20] 20  BP: (91-132)/(48-88) 127/86  MAP (mmHg):  [65-100] 98  SpO2:  [96 %-100 %] 100 %    Intake/Output Summary (Last 24 hours) at 04/04/2022 1255  Last data filed at 04/04/2022 1200  Gross per 24 hour   Intake 1718 ml   Output 3225 ml   Net -1507 ml        Physical Exam     General Exam:  General: Lying in bed. Appears comfortable. No obvious distress.   ENT:  Mucous membranes moist. Oropharynx clear.  Cardiovascular:  Regular rate and rhythm.  No murmurs.   2+ radial pulses bilaterally.    Respiratory: Breathing is comfortable and unlabored.  Lungs are clear to ausculation bilaterally.    Gastrointestinal: Soft, nontender, nondistended.  Extremities: Warm and well-perfused. No cyanosis, clubbing, or edema.  Skin: No obvious rashes or ecchymoses.    Neurological Exam:  Mental Status  LOC: awake and alert  Orientation: Oriented x 4  Neglect: not present  Speech: normal  Language: UTA    Cranial Nerves  Pupils: PERRL  Gaze: Resting nystagmus  Face Motor: normal and symmetric  Cough: strong    Motor:   RUE: spontaneous and 5/5  LUE: spontaneous and 5/5  RLE: spontaneous and 5/5  LLE: spontaneous and 5/5     Sensory:    Sensory: UTA    Glasgow Coma Score  Motor: obeys commands = 6  Verbal: oriented = 5  Eyes: eyes open spontaneously = 4        DISPOSITION     The patient requires admission to the [facility] for management of the above conditions.    Estimated Transfer Date:  5/10        BILLING     This is a subsequent evaluation of the patient.    Jenetta Loges, MD  Neurocritical Care Attending

## 2022-04-04 NOTE — Unmapped (Signed)
Pt Qshift Neuro this shift, AO x4, FC x4 with 5/5 strengths throughout. Endorsing full sensation. PERRL. Pt with episode of anxiety vs agitation this shift - No PRN's required. Sitter remains at bedside at all times. SBP within goals this shift. NSR per bedside monitor. RA, no complications noted. BM x2 this shift. Regular diet with appropriate intake this shift. Voiding with urinal. Plex 5/5 completed today. RIJ catheter removed via APP. 1 PIV, no gtts. See flowsheet for further details. Bed locked and low, side rails pu x4, alarms active and audible, will assess further.       Problem: Adult Inpatient Plan of Care  Goal: Plan of Care Review  Outcome: Ongoing - Unchanged  Goal: Patient-Specific Goal (Individualized)  Outcome: Ongoing - Unchanged  Goal: Absence of Hospital-Acquired Illness or Injury  Outcome: Ongoing - Unchanged  Intervention: Identify and Manage Fall Risk  Recent Flowsheet Documentation  Taken 04/03/2022 0800 by Marius Ditch, RN  Safety Interventions:   aspiration precautions   bed alarm   bleeding precautions   fall reduction program maintained   environmental modification   low bed   lighting adjusted for tasks/safety  Intervention: Prevent Skin Injury  Recent Flowsheet Documentation  Taken 04/03/2022 1600 by Marius Ditch, RN  Skin Protection:   adhesive use limited   skin-to-device areas padded   skin-to-skin areas padded  Taken 04/03/2022 1400 by Marius Ditch, RN  Skin Protection: adhesive use limited  Taken 04/03/2022 1200 by Marius Ditch, RN  Skin Protection:   adhesive use limited   skin-to-device areas padded   skin-to-skin areas padded  Taken 04/03/2022 1000 by Marius Ditch, RN  Skin Protection:   adhesive use limited   skin-to-device areas padded   skin-to-skin areas padded  Taken 04/03/2022 0800 by Marius Ditch, RN  Skin Protection:   adhesive use limited   skin-to-skin areas padded   skin-to-device areas padded  Intervention: Prevent and Manage VTE (Venous Thromboembolism) Risk  Recent Flowsheet Documentation  Taken 04/03/2022 1600 by Marius Ditch, RN  Activity Management: up ad lib  VTE Prevention/Management: bleeding precautions maintained  Taken 04/03/2022 1400 by Marius Ditch, RN  Activity Management: up ad lib  Taken 04/03/2022 1200 by Marius Ditch, RN  Activity Management: up ad lib  VTE Prevention/Management: bleeding precautions maintained  Taken 04/03/2022 1000 by Marius Ditch, RN  Activity Management: up ad lib  Taken 04/03/2022 0800 by Marius Ditch, RN  Activity Management: up ad lib  VTE Prevention/Management: bleeding precautions maintained  Intervention: Prevent Infection  Recent Flowsheet Documentation  Taken 04/03/2022 0800 by Marius Ditch, RN  Infection Prevention:   hand hygiene promoted   personal protective equipment utilized  Goal: Optimal Comfort and Wellbeing  Outcome: Ongoing - Unchanged  Goal: Readiness for Transition of Care  Outcome: Ongoing - Unchanged  Goal: Rounds/Family Conference  Outcome: Ongoing - Unchanged     Problem: Seizure Disorder Comorbidity  Goal: Maintenance of Seizure Control  Outcome: Ongoing - Unchanged     Problem: Self-Care Deficit  Goal: Improved Ability to Complete Activities of Daily Living  Outcome: Ongoing - Unchanged     Problem: Violence Risk or Actual  Goal: Anger and Impulse Control  Outcome: Ongoing - Unchanged  Intervention: Minimize Safety Risk  Recent Flowsheet Documentation  Taken 04/03/2022 0800 by Marius Ditch, RN  Sensory Stimulation Regulation:   care clustered   lighting decreased     Problem: VTE (Venous Thromboembolism)  Goal:  VTE (Venous Thromboembolism) Symptom Resolution  Outcome: Ongoing - Unchanged  Intervention: Prevent or Manage VTE (Venous Thromboembolism)  Recent Flowsheet Documentation  Taken 04/03/2022 1600 by Marius Ditch, RN  VTE Prevention/Management: bleeding precautions maintained  Anti-Embolism Device Type: SCD, Knee  Anti-Embolism Intervention: Refused  Anti-Embolism Device Location: BLE  Taken 04/03/2022 1400 by Marius Ditch, RN  Anti-Embolism Device Type: SCD, Knee  Anti-Embolism Intervention: Refused  Anti-Embolism Device Location: BLE  Taken 04/03/2022 1200 by Marius Ditch, RN  VTE Prevention/Management: bleeding precautions maintained  Anti-Embolism Device Type: SCD, Knee  Anti-Embolism Intervention: Refused  Anti-Embolism Device Location: BLE  Taken 04/03/2022 1000 by Marius Ditch, RN  Anti-Embolism Device Type: SCD, Knee  Anti-Embolism Intervention: Refused  Anti-Embolism Device Location: BLE  Taken 04/03/2022 0800 by Marius Ditch, RN  Bleeding Precautions:   blood pressure closely monitored   monitored for signs of bleeding  VTE Prevention/Management: bleeding precautions maintained  Anti-Embolism Device Type: SCD, Knee  Anti-Embolism Intervention: Refused  Anti-Embolism Device Location: BLE     Problem: Seizure, Active Management  Goal: Absence of Seizure/Seizure-Related Injury  Outcome: Ongoing - Unchanged     Problem: Adjustment to Illness (Delirium)  Goal: Optimal Coping  Outcome: Ongoing - Unchanged     Problem: Altered Behavior (Delirium)  Goal: Improved Behavioral Control  Outcome: Ongoing - Unchanged     Problem: Attention and Thought Clarity Impairment (Delirium)  Goal: Improved Attention and Thought Clarity  Outcome: Ongoing - Unchanged  Intervention: Maximize Cognitive Function  Recent Flowsheet Documentation  Taken 04/03/2022 0800 by Marius Ditch, RN  Sensory Stimulation Regulation:   care clustered   lighting decreased  Reorientation Measures:   calendar in view   clock in view     Problem: Sleep Disturbance (Delirium)  Goal: Improved Sleep  Outcome: Ongoing - Unchanged     Problem: Fall Injury Risk  Goal: Absence of Fall and Fall-Related Injury  Outcome: Ongoing - Unchanged  Intervention: Promote Injury-Free Environment  Recent Flowsheet Documentation  Taken 04/03/2022 0800 by Marius Ditch, RN  Safety Interventions:   aspiration precautions   bed alarm   bleeding precautions   fall reduction program maintained   environmental modification   low bed   lighting adjusted for tasks/safety     Problem: Skin Injury Risk Increased  Goal: Skin Health and Integrity  Outcome: Ongoing - Unchanged  Intervention: Optimize Skin Protection  Recent Flowsheet Documentation  Taken 04/03/2022 1600 by Marius Ditch, RN  Pressure Reduction Techniques:   heels elevated off bed   pressure points protected   weight shift assistance provided  Head of Bed (HOB) Positioning: HOB at 30-45 degrees  Pressure Reduction Devices:   heel offloading device utilized   positioning supports utilized   pressure-redistributing mattress utilized  Skin Protection:   adhesive use limited   skin-to-device areas padded   skin-to-skin areas padded  Taken 04/03/2022 1400 by Marius Ditch, RN  Pressure Reduction Techniques:   heels elevated off bed   pressure points protected   weight shift assistance provided  Head of Bed (HOB) Positioning: HOB at 30-45 degrees  Pressure Reduction Devices:   heel offloading device utilized   pressure-redistributing mattress utilized   positioning supports utilized  Skin Protection: adhesive use limited  Taken 04/03/2022 1200 by Marius Ditch, RN  Pressure Reduction Techniques:   heels elevated off bed   pressure points protected   weight shift assistance provided  Head of Bed (HOB) Positioning: HOB at 30-45  degrees  Pressure Reduction Devices:   heel offloading device utilized   positioning supports utilized   pressure-redistributing mattress utilized  Skin Protection:   adhesive use limited   skin-to-device areas padded   skin-to-skin areas padded  Taken 04/03/2022 1000 by Marius Ditch, RN  Pressure Reduction Techniques:   heels elevated off bed   pressure points protected   weight shift assistance provided  Head of Bed (HOB) Positioning: HOB at 30-45 degrees  Pressure Reduction Devices:   heel offloading device utilized   positioning supports utilized   pressure-redistributing mattress utilized  Skin Protection:   adhesive use limited   skin-to-device areas padded   skin-to-skin areas padded  Taken 04/03/2022 0800 by Marius Ditch, RN  Pressure Reduction Techniques:   heels elevated off bed   pressure points protected   weight shift assistance provided  Head of Bed (HOB) Positioning: HOB at 30-45 degrees  Pressure Reduction Devices:   heel offloading device utilized   positioning supports utilized   pressure-redistributing mattress utilized  Skin Protection:   adhesive use limited   skin-to-skin areas padded   skin-to-device areas padded

## 2022-04-04 NOTE — Unmapped (Signed)
Report given and pt handed off at approximately 0200.  Pt q shift neuro exams. A&O x4 but delusional and anxious. Pt refused all night medications and refused RN to flush IV. Pt removed all ECG monitoring and pulse ox- spot check only now per order. Providers aware of patients actions and refusal of care. Follows commands in all extremities. Nystagmus. Positive cough, gag deferred, corneals intact. NSR on monitor when able to check. SBP goals <180 and MAP goals >65 maintained. Room air. Regular diet. One BM this shift. Pt using urinal. Order for ok for 1 PIV. Afebrile. Sitter at bedside. See flowsheets for more info.     Problem: Adult Inpatient Plan of Care  Goal: Plan of Care Review  Outcome: Ongoing - Unchanged  Goal: Patient-Specific Goal (Individualized)  Outcome: Ongoing - Unchanged  Goal: Absence of Hospital-Acquired Illness or Injury  Outcome: Ongoing - Unchanged  Intervention: Identify and Manage Fall Risk  Recent Flowsheet Documentation  Taken 04/03/2022 2000 by Chad Cordial, RN  Safety Interventions:   aspiration precautions   low bed   environmental modification  Intervention: Prevent Skin Injury  Recent Flowsheet Documentation  Taken 04/04/2022 0000 by Chad Cordial, RN  Skin Protection:   tubing/devices free from skin contact   adhesive use limited   incontinence pads utilized  Taken 04/03/2022 2000 by Chad Cordial, RN  Skin Protection:   tubing/devices free from skin contact   adhesive use limited   incontinence pads utilized  Intervention: Prevent and Manage VTE (Venous Thromboembolism) Risk  Recent Flowsheet Documentation  Taken 04/04/2022 0000 by Chad Cordial, RN  Activity Management: up ad lib  Taken 04/03/2022 2200 by Chad Cordial, RN  Activity Management: up ad lib  Taken 04/03/2022 2000 by Chad Cordial, RN  Activity Management: up ad lib  Intervention: Prevent Infection  Recent Flowsheet Documentation  Taken 04/03/2022 2000 by Chad Cordial, RN  Infection Prevention:   cohorting utilized environmental surveillance performed  Goal: Optimal Comfort and Wellbeing  Outcome: Ongoing - Unchanged  Goal: Readiness for Transition of Care  Outcome: Ongoing - Unchanged  Goal: Rounds/Family Conference  Outcome: Ongoing - Unchanged     Problem: Seizure Disorder Comorbidity  Goal: Maintenance of Seizure Control  Outcome: Ongoing - Unchanged     Problem: Self-Care Deficit  Goal: Improved Ability to Complete Activities of Daily Living  Outcome: Ongoing - Unchanged     Problem: Violence Risk or Actual  Goal: Anger and Impulse Control  Outcome: Ongoing - Unchanged     Problem: VTE (Venous Thromboembolism)  Goal: VTE (Venous Thromboembolism) Symptom Resolution  Outcome: Ongoing - Unchanged  Intervention: Prevent or Manage VTE (Venous Thromboembolism)  Recent Flowsheet Documentation  Taken 04/04/2022 0000 by Chad Cordial, RN  Anti-Embolism Intervention: Refused  Taken 04/03/2022 2200 by Chad Cordial, RN  Anti-Embolism Intervention: Refused  Taken 04/03/2022 2000 by Chad Cordial, RN  Bleeding Precautions: blood pressure closely monitored  Anti-Embolism Intervention: Refused     Problem: Seizure, Active Management  Goal: Absence of Seizure/Seizure-Related Injury  Outcome: Ongoing - Unchanged     Problem: Adjustment to Illness (Delirium)  Goal: Optimal Coping  Outcome: Ongoing - Unchanged     Problem: Altered Behavior (Delirium)  Goal: Improved Behavioral Control  Outcome: Ongoing - Unchanged     Problem: Attention and Thought Clarity Impairment (Delirium)  Goal: Improved Attention and Thought Clarity  Outcome: Ongoing - Unchanged     Problem: Sleep Disturbance (Delirium)  Goal: Improved Sleep  Outcome: Ongoing - Unchanged     Problem: Fall Injury Risk  Goal: Absence of Fall and Fall-Related Injury  Outcome: Ongoing - Unchanged  Intervention: Promote Injury-Free Environment  Recent Flowsheet Documentation  Taken 04/03/2022 2000 by Chad Cordial, RN  Safety Interventions:   aspiration precautions   low bed   environmental modification     Problem: Skin Injury Risk Increased  Goal: Skin Health and Integrity  Outcome: Ongoing - Unchanged  Intervention: Optimize Skin Protection  Recent Flowsheet Documentation  Taken 04/04/2022 0000 by Chad Cordial, RN  Pressure Reduction Techniques: frequent weight shift encouraged  Head of Bed (HOB) Positioning: HOB at 30-45 degrees  Pressure Reduction Devices:   positioning supports utilized   pressure-redistributing mattress utilized  Skin Protection:   tubing/devices free from skin contact   adhesive use limited   incontinence pads utilized  Taken 04/03/2022 2200 by Chad Cordial, RN  Head of Bed [facility]) Positioning: HOB at 30-45 degrees  Taken 04/03/2022 2000 by Chad Cordial, RN  Pressure Reduction Techniques: heels elevated off bed  Head of Bed (HOB) Positioning: HOB at 30-45 degrees  Pressure Reduction Devices:   positioning supports utilized   pressure-redistributing mattress utilized  Skin Protection:   tubing/devices free from skin contact   adhesive use limited   incontinence pads utilized
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
## 2022-04-05 LAB — BLOOD GAS CRITICAL CARE PANEL, VENOUS
BASE EXCESS VENOUS: -1.1 (ref -2.0–2.0)
CALCIUM IONIZED VENOUS (MG/DL): 4.28 mg/dL — ABNORMAL LOW (ref 4.40–5.40)
GLUCOSE WHOLE BLOOD: 117 mg/dL (ref 70–179)
HCO3 VENOUS: 22 mmol/L (ref 22–27)
HEMOGLOBIN BLOOD GAS: 13.4 g/dL — ABNORMAL LOW
LACTATE BLOOD VENOUS: 3.4 mmol/L — ABNORMAL HIGH (ref 0.5–1.8)
O2 SATURATION VENOUS: 92.3 % — ABNORMAL HIGH (ref 40.0–85.0)
PCO2 VENOUS: 30 mmHg — ABNORMAL LOW (ref 40–60)
PH VENOUS: 7.48 — ABNORMAL HIGH (ref 7.32–7.43)
PO2 VENOUS: 59 mmHg — ABNORMAL HIGH (ref 30–55)
POTASSIUM WHOLE BLOOD: 4.7 mmol/L — ABNORMAL HIGH (ref 3.4–4.6)
SODIUM WHOLE BLOOD: 129 mmol/L — ABNORMAL LOW (ref 135–145)

## 2022-04-05 LAB — CBC
HEMATOCRIT: 38.4 % — ABNORMAL LOW (ref 39.0–48.0)
HEMOGLOBIN: 13.2 g/dL (ref 12.9–16.5)
MEAN CORPUSCULAR HEMOGLOBIN CONC: 34.2 g/dL (ref 32.0–36.0)
MEAN CORPUSCULAR HEMOGLOBIN: 31.7 pg (ref 25.9–32.4)
MEAN CORPUSCULAR VOLUME: 92.7 fL (ref 77.6–95.7)
MEAN PLATELET VOLUME: 8.2 fL (ref 6.8–10.7)
PLATELET COUNT: 225 10*9/L (ref 150–450)
RED BLOOD CELL COUNT: 4.15 10*12/L — ABNORMAL LOW (ref 4.26–5.60)
RED CELL DISTRIBUTION WIDTH: 13.6 % (ref 12.2–15.2)
WBC ADJUSTED: 7.3 10*9/L (ref 3.6–11.2)

## 2022-04-05 LAB — COMPREHENSIVE METABOLIC PANEL
ALBUMIN: 4.4 g/dL (ref 3.4–5.0)
ALKALINE PHOSPHATASE: 30 U/L — ABNORMAL LOW (ref 46–116)
ALT (SGPT): 28 U/L (ref 10–49)
ANION GAP: 10 mmol/L (ref 5–14)
AST (SGOT): 29 U/L (ref <=34)
BILIRUBIN TOTAL: 0.3 mg/dL (ref 0.3–1.2)
BLOOD UREA NITROGEN: 8 mg/dL — ABNORMAL LOW (ref 9–23)
BUN / CREAT RATIO: 10
CALCIUM: 8.6 mg/dL — ABNORMAL LOW (ref 8.7–10.4)
CHLORIDE: 101 mmol/L (ref 98–107)
CO2: 22 mmol/L (ref 20.0–31.0)
CREATININE: 0.81 mg/dL
EGFR CKD-EPI (2021) MALE: >90 mL/min/{1.73_m2} (ref >=60)
GLUCOSE RANDOM: 115 mg/dL (ref 70–179)
POTASSIUM: 4.9 mmol/L — ABNORMAL HIGH (ref 3.4–4.8)
PROTEIN TOTAL: 6.3 g/dL (ref 5.7–8.2)
SODIUM: 133 mmol/L — ABNORMAL LOW (ref 135–145)

## 2022-04-05 LAB — HIGH SENSITIVITY TROPONIN I - SINGLE: HIGH SENSITIVITY TROPONIN I: 4 ng/L (ref <=53)

## 2022-04-05 MED ADMIN — enoxaparin (LOVENOX) syringe 40 mg: 40 mg | SUBCUTANEOUS

## 2022-04-05 MED ADMIN — sodium chloride (NS) 0.9 % infusion: 200 mL/h | INTRAVENOUS | @ 14:00:00

## 2022-04-05 MED ADMIN — lamoTRIgine (LaMICtal) tablet 200 mg: 200 mg | ORAL | @ 23:00:00

## 2022-04-05 MED ADMIN — lamoTRIgine (LaMICtal) tablet 200 mg: 200 mg | ORAL

## 2022-04-05 MED ADMIN — senna (SENOKOT) tablet 2 tablet: 2 | ORAL | Stop: 2022-04-11

## 2022-04-05 MED ADMIN — melatonin tablet 3 mg: 3 mg | ORAL | @ 22:00:00

## 2022-04-05 MED ADMIN — melatonin tablet 3 mg: 3 mg | ORAL

## 2022-04-05 MED ADMIN — mesna (MESNEX) 0.297 g in sodium chloride (NS) 0.9 % 50 mL IVPB: .15 g/m2 | INTRAVENOUS | @ 22:00:00 | Stop: 2022-04-06

## 2022-04-05 MED ADMIN — mesna (MESNEX) 0.297 g in sodium chloride (NS) 0.9 % 50 mL IVPB: .15 g/m2 | INTRAVENOUS | @ 17:00:00 | Stop: 2022-04-05

## 2022-04-05 MED ADMIN — polyethylene glycol (MIRALAX) packet 17 g: 17 g | ORAL | @ 15:00:00

## 2022-04-05 MED ADMIN — acetaminophen (TYLENOL) tablet 650 mg: 650 mg | ORAL | @ 21:00:00

## 2022-04-05 MED ADMIN — divalproex ER (DEPAKOTE ER) extended released 24 hr tablet 1,000 mg: 1000 mg | ORAL

## 2022-04-05 MED ADMIN — folic acid (FOLVITE) tablet 1 mg: 1 mg | ORAL | @ 15:00:00

## 2022-04-05 MED ADMIN — haloperidol lactate (HALDOL) injection 3 mg: 3 mg | INTRAVENOUS | @ 13:00:00

## 2022-04-05 MED ADMIN — LORazepam (ATIVAN) injection 1 mg: 1 mg | INTRAVENOUS | @ 21:00:00

## 2022-04-05 MED ADMIN — cycloPHOSphamide (CYTOXAN) 1.485 g IVPB: .75 g/m2 | INTRAVENOUS | @ 17:00:00 | Stop: 2022-04-05

## 2022-04-05 MED ADMIN — diphenhydrAMINE (BENADRYL) injection: 12.5 mg | INTRAVENOUS | @ 13:00:00

## 2022-04-05 MED ADMIN — divalproex ER (DEPAKOTE ER) extended released 24 hr tablet 500 mg: 500 mg | ORAL | @ 13:00:00

## 2022-04-05 MED ADMIN — propranoloL (INDERAL) tablet 20 mg: 20 mg | ORAL

## 2022-04-05 MED ADMIN — LORazepam (ATIVAN) injection 1 mg: 1 mg | INTRAVENOUS | @ 13:00:00

## 2022-04-05 MED ADMIN — divalproex ER (DEPAKOTE ER) extended released 24 hr tablet 1,000 mg: 1000 mg | ORAL | @ 22:00:00

## 2022-04-05 MED ADMIN — sodium chloride (NS) 0.9 % infusion: 200 mL/h | INTRAVENOUS | @ 19:00:00

## 2022-04-05 MED ADMIN — ondansetron (ZOFRAN-ODT) disintegrating tablet 8 mg: 8 mg | ORAL | @ 17:00:00 | Stop: 2022-04-05

## 2022-04-05 MED ADMIN — multivitamins, therapeutic with minerals tablet 1 tablet: 1 | ORAL | @ 15:00:00

## 2022-04-05 MED ADMIN — ondansetron (ZOFRAN) injection 4 mg: 4 mg | INTRAVENOUS | @ 13:00:00

## 2022-04-05 MED ADMIN — dexAMETHasone (DECADRON) tablet 10 mg: 10 mg | ORAL | @ 17:00:00 | Stop: 2022-04-05

## 2022-04-05 MED ADMIN — ** PATIENT-SUPPLIED, IN PYXIS 1 ** cenobamate 400 mg tablet: 400 mg | ORAL | @ 02:00:00

## 2022-04-05 MED ADMIN — lamoTRIgine (LaMICtal) tablet 200 mg: 200 mg | ORAL | @ 15:00:00

## 2022-04-05 MED ADMIN — diphenhydrAMINE (BENADRYL) injection: 12.5 mg | INTRAVENOUS | @ 22:00:00

## 2022-04-05 MED ADMIN — thiamine mononitrate (vit B1) tablet 100 mg: 100 mg | ORAL | @ 15:00:00

## 2022-04-05 MED ADMIN — propranoloL (INDERAL) tablet 20 mg: 20 mg | ORAL | @ 15:00:00

## 2022-04-05 MED ADMIN — mesna (MESNEX) 0.297 g in sodium chloride (NS) 0.9 % 50 mL IVPB: .15 g/m2 | INTRAVENOUS | @ 18:00:00 | Stop: 2022-04-06

## 2022-04-05 MED ADMIN — haloperidol lactate (HALDOL) injection 3 mg: 3 mg | INTRAVENOUS | @ 22:00:00

## 2022-04-05 MED ADMIN — acetaminophen (TYLENOL) tablet 650 mg: 650 mg | ORAL

## 2022-04-05 NOTE — Unmapped (Signed)
Neurology Inpatient Team B (NMB)  Daily Progress Note       Patient: Austin Banks  Code Status: Full Code  Level of Care: Step-down status.   LOS: 23 days      Overnight Events & Subjective:     - Haldol increased by psych on 5/10  - Cytoxan labs resulted; 1st Cytoxan dose anticipated today  - Received [facility] referral paperwork from [facility]      Physical Exam:     General Exam:   General Appearance: lying in bed. In NAD   Lungs: Normal work of breathing.    Extremities: No clubbing or cyanosis.     Neurological Exam:   Mental Status:   Sitting up in bed with eyes open. Being fed by sitter. Calm     Cranial Nerves:   Mild L facial droop at rest (baseline).   EOM grossly intact.      Sensory exam:   Not done today. Previously light touch has been normal in the bilateral upper extremities and bilateral lower extremities.     Motor Exam:   Normal bulk   Moving all extremities spontaneously and equally.     Reflexes:   Deferred. Previously 1+ throughout bilaterally, Hoffmann's absent bilaterally, Toes down going bilaterally    Cerebellar/Coordination/Gai:  Observed taking a few steps around bed in hospital room, unassisted       Assessment/Plan:     Assessment: Austin Banks is a 31 y.o. 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      # Dysarthria - AMS - Imbalance - Difficulty Ambulating: Patient presented with 2 weeks of worsening balance and fluctuating mental status in the setting of recent medication changes, frequent marijuana use and falls associated with seizure and subsequent head injury. His symptoms are likely medication and drug related given the multiple CNS sedating meds he requires for seizure control with concurrent drug use. Other than the dysarthria which has improved and the multi-directional nystagmus, there are no other focal neurological findings on exam. With his history of medically refractory epilepsy and questionable compliance, cannot rule out subclinical events contributing to his fluctuating mental status as well, but EEG during admission has not revealed any seizures.    # GAD 65 c/b focal to bilateral tonic clonic seizures: Medically refractory, currently undergoing DBS vs VNS candidacy. Given one dose on IV VPA 500mg  and placed on IV maintenance as we await NG tube placement vs PO clearance. Patient endorses compliance with ASMs, however, he spends half the week with friends per father, partying and smoking marijuana and so compliance is questionable. He has had subtherapeutic levels of Lamictal in the past. Valproic acid was obtained on admission but collected after he received 500mg  IV. Last Rituxan dose was 4/24. S/p PLEx x5 (5/2 - 5/9). Initiated Cytoxan on 5/11.      Workup:  Resulted:  - CTH: No acute abnormalities, On personal review, patient has atrophy greater than expected for age cerebellum>cerebral cortex  - CT C/A/P: No abnormal lesions/masses  - CT cervical spine: No fracture or traumatic malalignment of the cervical spine.  - UA unremarkable  - VPA level 74.0  - Vitamin B12 wnl, RPR nonreactive  - Alcohol level (<10.0 4/19pm)  - Utox (+cannabinoids + benzos)  - cvEEG (03/14/22  0430 to 03/15/22 11:30 AM ): There were bilateral independent (L>>R) temporal epileptiform discharges indicating focal cortical irritation and increased potential for seizure activity over there regions. There was moderate encephalopathy which is non-specific in etiology. There are no seizures. EEG discontinued on 4/20; restarted on 5/2 x2 days with bitemporal sharp waves and R>L temporal slowing.  - lamotrigine level 6.8 (4/20)  - clobazam level 50 (4/19)  - B1 level 113 (4/19)  - Discontinued onfi due to oversedation  - Discontinued Fycompa 2 mg at bedtime (s4/20, dc 4/27) given literature reports of increased agitation  - GAD 65 antibody elevated 163 (03/14/22) previously 40.4 in June 2021  - MRI brain with and without contrast done 5/3: R > L cerebellar atrophy disproportionate for age. No abnormal enhancement  - ANA/ENA negative    Plan:  - Continue home lamictal 200mg  BID  - Continue IV valproate to 500mg  qAM + 1000mg  nightly at 6pm for seizure and behavioral management   - Continue haldol 2mg  bid standing dose, benadryl 12.5 mg bid standing dose, lorazepam 1mg  bid standing dose   - Continue cenobamate 400mg  at bedtime   - Repeat GAD 65 Ab 10 days after last PLEX session (5/19)  - Cytoxan pre-labs completed (CBC w/ diff, UA, CMP, Albumin, Hep B, Hep C serologies, TB test  - Cyclophosphamide 750 mg/m2 planned for today (5/11)  - Followed by monthly cycles of cyclophosphamide in the same dose (with MESNA) for 3-6 months (see 5/9 progress note for references and MESNA dosing details)  - After 3-6 months, likely plan to switch to Actemra    # Dysarthria - Excessive Drooling - C/f Aspiration: Patient had excessive coughing with liquid coming out his nose during bedside swallow eval and thus failed. Possibly medication related as Onfi was initiated recently (02/17/22). Father also notes dysarthria after his marijuana use. CXR suggested mild left lower lobe consolidation c/f possible aspiration PNA but lungs clear on CT chest. NG tube placed with continuous tube feeds upon admission but patient able to tolerate regular diet since 4/21.  - Nutrition consulted; appreciate recs    # Weight Loss - Poor Appetite: Patients father reports ~ 30lb weight loss over the past 6 months. Weight was 87kg in 06/2021, 77.7kg this admission. With his know GAD 65 positivity and tobacco abuse, malignancy screening with CT chest, abdomen and pelvis was obtained and unremarkable. Poor appetite could also be medication and/or tobacco use related.  - Nutrition consulted; appreciate recs     # Psychosis - Schizophrenia: IVC currently in place. Behavioral response called for increasing combativeness in attempt to elope requiring IM Ativan 2mg , followed by Haldol. Patient seems emotionally labile-appearing calm amenable to admission on initial evaluation but after his father left the bedside he became agitated, loud, refusing admission and encroaching on examiner's personal space. Patient has required multiple psychiatric admissions in the past most recently held in our Psych holding unit in 04/2021 for behavioral problems. Of note, patient has ongoing significant behavioral issues including paranoia and attempts to leave hospital, requiring 4 point restraints. His GAD-65 disease is not likely the primary etiology of his severe psychiatric symptoms. Psychiatry is following.   -- Psychiatry consulted, appreciate recs  - IV Depakote 500mg  qAM + 1000mg  nightly (at 6pm) as above   - Depakote levels:   - 4/25 subtherapeutic 38.6   - 4/28 therapeutic at 68.1   - 4/29 subtherapeutic at 48.7              -  5/2 therapeutic at 55.3              - 5/5 therapeutic at 60.6  - Increase haldol 2mg  BID --> 3mg  BID standing dose (9AM, 6PM) (increased 5/10)  - Continue Benadryl 12.5 mg BID standing dose (9AM, 6PM)  - Continue lorazepam 1mg  BID standing dose (9AM, 6PM)  - Continue propranolol 20mg  BID in place of home propranolol XR 60mg  daily  - CONTINUE home lamictal 200 mg PO BID  - HOLD home trazodone 200 mg nightly  -- Agitation PRNs:   -- Continue Haldol 2mg  IV bid PRN   -- Continue Ativan 1mg  IV bid PRN   -- Continue Benadryl 12.5mg  IV bid PRN  -- continue thiamine PO/NGT supplementation daily   -- continue folate supplementation and multivitamin daily   -- continue telemetry monitoring    - EKG to monitor QTc (399 on 5/10)     # Polysubstance Use: Seen in [facility]. He has been referred to smoking cessation program. Patient endorses daily tobacco use and marijuana use (when visiting friends weekly). Denies recent cocaine use. Utox (+cannabinoids + benzos).  - Nicotine patch daily though patient refuses.      Discharge Planning:   - Case management: consulted. Recommendations appreciated.  - Social work: N/A  - PT: consulted, 5xL  - OT: consulted, 3x  - SLP: 3x  - Expected Discharge Disposition: TBD. Referral for [facility] placed. Expanded referrals statewide for inpatient psych admission.      Checklist:  - Diet: Regular diet  - IV fluids: per Cytoxan protocol  - Bowel Regimen: senna  - GI PPX: No GI indications  - DVT PPX: Lovenox 40 mg  daily  - Lines/Access:   Patient Lines/Drains/Airways Status       Active Active Lines, Drains, & Airways       Name Placement date Placement time Site Days    Peripheral IV 03/31/22 Anterior;Left Forearm 03/31/22  1800  Forearm  4                  - Foley: No     Patient seen and discussed with attending physician, Dr. Dan Humphreys who is in agreement with the assessment and plan.      Neurology Team B resident can be reached at [phone].    Alwyn Pea, MD   Neurology, PGY-2     Data Review:     Contact Information:  Family contact: Junie, Engram (Father)   [phone] (Mobile)    PCP: [facility]    Medications:  Scheduled medications:    cenobamate  400 mg Oral At bedtime    calcium chloride        calcium chloride        diphenhydrAMINE  12.5 mg Intravenous BID    valproate sodium  1,000 mg Intravenous Nightly    Or    divalproex ER  1,000 mg Oral Nightly    valproate sodium  500 mg Intravenous Daily before breakfast    Or    divalproex ER  500 mg Oral Daily before breakfast    enoxaparin (LOVENOX) injection  40 mg Subcutaneous Q24H SCH    folic acid  1 mg Oral Daily    haloperidol lactate  3 mg Intravenous BID    And    LORazepam  1 mg Intravenous BID    heparin, porcine (PF)        heparin, porcine (PF)        lamoTRIgine  200 mg Oral BID    melatonin  3 mg Oral QPM    mesna (MESNEX) IVPB  0.15 g/m2 Intravenous Q3H    multivitamins, therapeutic with minerals  1 tablet Oral Daily    polyethylene glycol  17 g Oral Daily    propranoloL  20 mg Oral BID    senna  2 tablet Oral Nightly    thiamine mononitrate (vit B1)  100 mg Oral Daily     Continuous infusions:    sodium chloride 200 mL/hr (04/05/22 1022)    sodium chloride 200 mL/hr (04/05/22 1443)         PRN medications: acetaminophen, haloperidol lactate **AND** diphenhydrAMINE, LORazepam, ondansetron    24 hour vital signs:  Temp:  [36.6 ??C (97.8 ??F)-37.1 ??C (98.7 ??F)] 36.7 ??C (98 ??F)  Heart Rate:  [66-84] 79  SpO2 Pulse:  [66-92] 79  Resp:  [14-20] 17  BP: (111-130)/(44-78) 130/71  MAP (mmHg):  [70-88] 86  SpO2:  [97 %-99 %] 99 %    Ins and Outs:  I/O this shift:  In: 671 [I.V.:546.7; IV Piggyback:124.3]  Out: 1150 [Urine:1150]    Laboratory values:  All Labs Last 24hrs:   No results found for this or any previous visit (from the past 24 hour(s)).          Imaging:  Pertinent imaging discussed in the A/P section

## 2022-04-05 NOTE — Unmapped (Signed)
Upon assessment, patient c/o pain in neck where line had been removed; pain managed with PRNs. 4-point violent restraints in place. Sitter at bedside. Neuro checks done. Aspiration, falls, delirium, and elopement precautions followed. Intermediate status maintained.    Problem: Adult Inpatient Plan of Care  Goal: Absence of Hospital-Acquired Illness or Injury  Intervention: Identify and Manage Fall Risk  Recent Flowsheet Documentation  Taken 04/04/2022 2000 by Murriel Hopper, RN  Safety Interventions:   aspiration precautions   bed alarm   commode/urinal/bedpan at bedside   environmental modification   fall reduction program maintained   infection management   lighting adjusted for tasks/safety   low bed   elopement precautions   sitter at bedside  Intervention: Prevent Skin Injury  Recent Flowsheet Documentation  Taken 04/04/2022 2000 by Murriel Hopper, RN  Skin Protection:   adhesive use limited   incontinence pads utilized   transparent dressing maintained  Intervention: Prevent Infection  Recent Flowsheet Documentation  Taken 04/04/2022 2000 by Murriel Hopper, RN  Infection Prevention: hand hygiene promoted     Problem: Violent/Self-Destructive Restraints  Intervention: Utilize least restrictive measures  Recent Flowsheet Documentation  Taken 04/05/2022 0400 by Murriel Hopper, RN  Less Restrictive Alternative: Comfort Measures  Risk Factors: Pre-existing medical conditions  Taken 04/05/2022 0347 by Murriel Hopper, RN  Less Restrictive Alternative: Comfort Measures  Risk Factors: Pre-existing medical conditions  Taken 04/05/2022 0200 by Murriel Hopper, RN  Less Restrictive Alternative: Comfort Measures  Risk Factors: Pre-existing medical conditions  Taken 04/05/2022 0000 by Murriel Hopper, RN  Less Restrictive Alternative: Comfort Measures  Risk Factors: Pre-existing medical conditions  Taken 04/04/2022 2339 by Murriel Hopper, RN  Less Restrictive Alternative: Comfort Measures  Risk Factors: Pre-existing medical conditions  Taken 04/04/2022 2200 by Murriel Hopper, RN  Less Restrictive Alternative: Comfort Measures  Risk Factors: Pre-existing medical conditions  Taken 04/04/2022 2000 by Murriel Hopper, RN  Less Restrictive Alternative: Comfort Measures  Risk Factors: Pre-existing medical conditions  Taken 04/04/2022 1937 by Murriel Hopper, RN  Less Restrictive Alternative: Comfort Measures  Risk Factors: Pre-existing medical conditions  Intervention: Patient Monitoring  Recent Flowsheet Documentation  Taken 04/05/2022 0400 by Murriel Hopper, RN  Circulation/Skin Integrity: No signs of injury  Range of Motion: Performed  Fluids: Offered  Food/Meal: Offered  Elimination: Offered  Taken 04/05/2022 0347 by Murriel Hopper, RN  Circulation/Skin Integrity: No signs of injury  Range of Motion: Performed  Fluids: Offered  Food/Meal: Offered  Elimination: Offered  Taken 04/05/2022 0200 by Murriel Hopper, RN  Circulation/Skin Integrity: No signs of injury  Range of Motion: Performed  Fluids: Offered  Food/Meal: Offered  Elimination: Offered  Taken 04/05/2022 0000 by Murriel Hopper, RN  Circulation/Skin Integrity: No signs of injury  Range of Motion: Performed  Fluids: Offered  Food/Meal: Offered  Elimination: Offered  Taken 04/04/2022 2339 by Murriel Hopper, RN  Circulation/Skin Integrity: No signs of injury  Range of Motion: Performed  Fluids: Offered  Food/Meal: Offered  Elimination: Offered  Taken 04/04/2022 2200 by Murriel Hopper, RN  Circulation/Skin Integrity: No signs of injury  Range of Motion: Performed  Fluids: Offered  Food/Meal: Offered  Elimination: Offered  Taken 04/04/2022 2000 by Murriel Hopper, RN  Circulation/Skin Integrity: No signs of injury  Range of Motion: Performed  Fluids: Offered  Food/Meal: Offered  Elimination: Offered  Taken 04/04/2022 1937 by Murriel Hopper, RN  Circulation/Skin Integrity: No signs of injury  Range of Motion: Performed  Fluids: Offered  Food/Meal: Offered  Elimination: Offered  Intervention: Continuous Patient  Observation  Recent Flowsheet Documentation  Taken 04/05/2022 0347 by Murriel Hopper, RN  Psychological Status/Visual Check: Asleep  Continuous Observation: Yes  Taken 04/04/2022 2339 by Murriel Hopper, RN  Psychological Status/Visual Check: Asleep  Continuous Observation: Yes  Taken 04/04/2022 1937 by Murriel Hopper, RN  Psychological Status/Visual Check: Confused  Continuous Observation: Yes  Intervention: The continued need for restraints or less restrictive form of restraints will be assessed and documented.  Recent Flowsheet Documentation  Taken 04/05/2022 0400 by Murriel Hopper, RN  Clinical Justification:   Imminent risk of harm to self   Imminent risk of harm to others  Taken 04/05/2022 0347 by Murriel Hopper, RN  Clinical Justification:   Imminent risk of harm to self   Imminent risk of harm to others  Taken 04/05/2022 0200 by Murriel Hopper, RN  Clinical Justification:   Imminent risk of harm to self   Imminent risk of harm to others  Taken 04/05/2022 0000 by Murriel Hopper, RN  Clinical Justification:   Imminent risk of harm to self   Imminent risk of harm to others  Taken 04/04/2022 2339 by Murriel Hopper, RN  Clinical Justification:   Imminent risk of harm to others   Imminent risk of harm to self  Taken 04/04/2022 2200 by Murriel Hopper, RN  Clinical Justification:   Imminent risk of harm to others   Imminent risk of harm to self  Taken 04/04/2022 2000 by Murriel Hopper, RN  Clinical Justification:   Imminent risk of harm to others   Imminent risk of harm to self  Taken 04/04/2022 1937 by Murriel Hopper, RN  Clinical Justification:   Imminent risk of harm to others   Imminent risk of harm to self  Intervention: Patient Education  Recent Flowsheet Documentation  Taken 04/05/2022 0347 by Murriel Hopper, RN  Criteria Explained: Yes  Patient's Response: Needs reinforcement  Family Notification: Other (Comment)  Taken 04/04/2022 2339 by Murriel Hopper, RN  Criteria Explained: Yes  Patient's Response: Needs reinforcement  Family Notification: Other (Comment)  Taken 04/04/2022 1937 by Murriel Hopper, RN  Criteria Explained: Yes  Patient's Response: Needs reinforcement  Family Notification: Other (Comment)     Problem: VTE (Venous Thromboembolism)  Goal: VTE (Venous Thromboembolism) Symptom Resolution  Intervention: Prevent or Manage VTE (Venous Thromboembolism)  Recent Flowsheet Documentation  Taken 04/05/2022 0400 by Murriel Hopper, RN  Anti-Embolism Device Type: SCD, Knee  Anti-Embolism Intervention: Refused  Anti-Embolism Device Location: BLE  Taken 04/05/2022 0200 by Murriel Hopper, RN  Anti-Embolism Device Type: SCD, Knee  Anti-Embolism Intervention: Refused  Anti-Embolism Device Location: BLE  Taken 04/05/2022 0000 by Murriel Hopper, RN  Anti-Embolism Device Type: SCD, Knee  Anti-Embolism Intervention: Refused  Anti-Embolism Device Location: BLE  Taken 04/04/2022 2200 by Murriel Hopper, RN  Anti-Embolism Device Type: SCD, Knee  Anti-Embolism Intervention: Refused  Anti-Embolism Device Location: BLE  Taken 04/04/2022 2000 by Murriel Hopper, RN  Bleeding Precautions:   blood pressure closely monitored   monitored for signs of bleeding  Anti-Embolism Device Type: SCD, Knee  Anti-Embolism Intervention: Refused  Anti-Embolism Device Location: BLE     Problem: Fall Injury Risk  Goal: Absence of Fall and Fall-Related Injury  Intervention: Promote Injury-Free Environment  Recent Flowsheet Documentation  Taken 04/04/2022 2000 by Murriel Hopper, RN  Safety Interventions:   aspiration precautions   bed alarm   commode/urinal/bedpan at bedside   environmental modification   fall reduction program maintained   infection management   lighting adjusted for tasks/safety   low bed  elopement precautions   sitter at bedside     Problem: Skin Injury Risk Increased  Goal: Skin Health and Integrity  Intervention: Optimize Skin Protection  Recent Flowsheet Documentation  Taken 04/05/2022 0400 by Murriel Hopper, RN  Head of Bed [facility]) Positioning: HOB at 30-45 degrees  Taken 04/05/2022 0200 by Murriel Hopper, RN  Head of Bed [facility]) Positioning: HOB at 30-45 degrees  Taken 04/05/2022 0000 by Murriel Hopper, RN  Head of Bed [facility]) Positioning: HOB at 30-45 degrees  Taken 04/04/2022 2200 by Murriel Hopper, RN  Head of Bed [facility]) Positioning: HOB at 30-45 degrees  Taken 04/04/2022 2000 by Murriel Hopper, RN  Pressure Reduction Techniques:   frequent weight shift encouraged   weight shift assistance provided  Head of Bed (HOB) Positioning: HOB at 30-45 degrees  Pressure Reduction Devices:   pressure-redistributing mattress utilized   positioning supports utilized  Skin Protection:   adhesive use limited   incontinence pads utilized   transparent dressing maintained

## 2022-04-06 LAB — BASIC METABOLIC PANEL
ANION GAP: 10 mmol/L (ref 5–14)
BLOOD UREA NITROGEN: 6 mg/dL — ABNORMAL LOW (ref 9–23)
BUN / CREAT RATIO: 9
CALCIUM: 8.9 mg/dL (ref 8.7–10.4)
CHLORIDE: 106 mmol/L (ref 98–107)
CO2: 25 mmol/L (ref 20.0–31.0)
CREATININE: 0.67 mg/dL
EGFR CKD-EPI (2021) MALE: >90 mL/min/{1.73_m2} (ref >=60)
GLUCOSE RANDOM: 86 mg/dL (ref 70–179)
POTASSIUM: 4.1 mmol/L (ref 3.4–4.8)
SODIUM: 141 mmol/L (ref 135–145)

## 2022-04-06 LAB — CBC
HEMATOCRIT: 37.1 % — ABNORMAL LOW (ref 39.0–48.0)
HEMOGLOBIN: 12.8 g/dL — ABNORMAL LOW (ref 12.9–16.5)
MEAN CORPUSCULAR HEMOGLOBIN CONC: 34.3 g/dL (ref 32.0–36.0)
MEAN CORPUSCULAR HEMOGLOBIN: 32.1 pg (ref 25.9–32.4)
MEAN CORPUSCULAR VOLUME: 93.5 fL (ref 77.6–95.7)
MEAN PLATELET VOLUME: 8.6 fL (ref 6.8–10.7)
PLATELET COUNT: 236 10*9/L (ref 150–450)
RED BLOOD CELL COUNT: 3.97 10*12/L — ABNORMAL LOW (ref 4.26–5.60)
RED CELL DISTRIBUTION WIDTH: 13.7 % (ref 12.2–15.2)
WBC ADJUSTED: 5.5 10*9/L (ref 3.6–11.2)

## 2022-04-06 MED ADMIN — haloperidol lactate (HALDOL) injection 3 mg: 3 mg | INTRAVENOUS | @ 22:00:00

## 2022-04-06 MED ADMIN — polyethylene glycol (MIRALAX) packet 17 g: 17 g | ORAL | @ 14:00:00

## 2022-04-06 MED ADMIN — folic acid (FOLVITE) tablet 1 mg: 1 mg | ORAL | @ 14:00:00

## 2022-04-06 MED ADMIN — multivitamins, therapeutic with minerals tablet 1 tablet: 1 | ORAL | @ 14:00:00

## 2022-04-06 MED ADMIN — lamoTRIgine (LaMICtal) tablet 200 mg: 200 mg | ORAL | @ 14:00:00

## 2022-04-06 MED ADMIN — propranoloL (INDERAL) tablet 20 mg: 20 mg | ORAL | @ 01:00:00

## 2022-04-06 MED ADMIN — senna (SENOKOT) tablet 2 tablet: 2 | ORAL | @ 01:00:00 | Stop: 2022-04-11

## 2022-04-06 MED ADMIN — ** PATIENT-SUPPLIED, IN PYXIS 1 ** cenobamate 400 mg tablet: 400 mg | ORAL | @ 01:00:00

## 2022-04-06 MED ADMIN — divalproex ER (DEPAKOTE ER) extended released 24 hr tablet 500 mg: 500 mg | ORAL | @ 14:00:00

## 2022-04-06 MED ADMIN — thiamine mononitrate (vit B1) tablet 100 mg: 100 mg | ORAL | @ 14:00:00

## 2022-04-06 MED ADMIN — enoxaparin (LOVENOX) syringe 40 mg: 40 mg | SUBCUTANEOUS | @ 01:00:00

## 2022-04-06 MED ADMIN — propranoloL (INDERAL) tablet 20 mg: 20 mg | ORAL | @ 14:00:00

## 2022-04-06 MED ADMIN — LORazepam (ATIVAN) injection 1 mg: 1 mg | INTRAVENOUS | @ 22:00:00

## 2022-04-06 MED ADMIN — mesna (MESNEX) 0.297 g in sodium chloride (NS) 0.9 % 50 mL IVPB: .15 g/m2 | INTRAVENOUS | @ 04:00:00 | Stop: 2022-04-06

## 2022-04-06 MED ADMIN — diphenhydrAMINE (BENADRYL) injection: 12.5 mg | INTRAVENOUS | @ 22:00:00

## 2022-04-06 MED ADMIN — divalproex ER (DEPAKOTE ER) extended released 24 hr tablet 1,000 mg: 1000 mg | ORAL | @ 22:00:00

## 2022-04-06 MED ADMIN — lamoTRIgine (LaMICtal) tablet 200 mg: 200 mg | ORAL | @ 22:00:00

## 2022-04-06 MED ADMIN — haloperidol lactate (HALDOL) injection 3 mg: 3 mg | INTRAVENOUS | @ 14:00:00

## 2022-04-06 MED ADMIN — melatonin tablet 3 mg: 3 mg | ORAL | @ 22:00:00

## 2022-04-06 MED ADMIN — LORazepam (ATIVAN) injection 1 mg: 1 mg | INTRAVENOUS | @ 14:00:00

## 2022-04-06 MED ADMIN — diphenhydrAMINE (BENADRYL) injection: 12.5 mg | INTRAVENOUS | @ 14:00:00

## 2022-04-06 MED ADMIN — mesna (MESNEX) 0.297 g in sodium chloride (NS) 0.9 % 50 mL IVPB: .15 g/m2 | INTRAVENOUS | @ 01:00:00 | Stop: 2022-04-06

## 2022-04-06 NOTE — Unmapped (Signed)
[facility]  Follow-Up Psychiatry Consult Note     Service Date: Apr 06, 2022  LOS:  LOS: 24 days      Assessment:   Austin Banks is a 31 y.o. 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  Patient was seen in consultation by Psychiatry at the request of Austin Nutting, MD with Neurology (NEU) for evaluation of Aggression/Agitation, Behavioral recommendations, Medication recommendations and Recommendations for pre-existing mental illness.     At the time of initial psychiatry evaluation on 4/19 patient was unable to participate in interview or assessment given sedation following PRN medications, but documented history per chart review as well as report from primary team are consistent with long term primary psychotic disorder (schizophrenia) as well as co-morbid focal epilepsy initially presenting with altered mental status, worsening balance, dysarthria, cognition, weight loss, and poor appetite with ongoing work-up, evaluation, and treatment at this time.  Current case formulation after obtaining further collateral is that Austin Banks is a young man with a primary psychotic disorder (schizophrenia) which has been historically serious enough to warrant state hospitalization as well as ACT team support, with a difficult to Banks seizure disorder worsened by trouble with medication adherence, historical closed head trauma, chronic and recent significant marijuana (and historically polysubstance use) use worsening both his psychotic disorder and seizure disorder, likely resulting in a brain suffering insults both chronically and acutely leading to lability, poor impulse control, agitation and aggression, and his recent behavioral dysregulation. We feel that this alteration in behavior and impulsivity from baseline may represent a worsening of his seizure disorder and/or autoimmune condition (especially as he approaches his next IVIG treatment and condition) in context of his severe and persistent mental illness, and would refrain from attributing these behaviors to a volitional source, personality disorder, or criminal aspect. This does not minimize the risk Austin Banks currently presents to staff at this time, but we feel that ongoing diagnosis, stabilization, and treatment of possible underlying neurologic and psychiatric processes at this time are also necessary despite the behavioral and logistical barriers to these steps.     On 5/12 patient appears to be doing well without agitation or aggression. Was polite and eager to work with team in hopes of returning home. As of now, patient has been referred to [facility] for psychiatric admission. However, patient may be able to reconstitute psychiatrically to be able to discharge home if he can demonstrate safe behaviors. Would recommend that primary team continue to assess need for physical restraints and discontinue restraints when they feel safe to do so. If patient can demonstrate safe behavior without restraints he may be appropriate to discharge home rather than psychiatric admission. If patient is able to demonstrate safe behaviors over the weekend, we will work to change his medication regimen to be more similar to his home regimen in preparation of potential discharge. If patient is NOT able to demonstrate safe behaviors, then we will have to continue to pursue admission to [facility]. Will continue to follow.     Behavioral/environmental, medication, work-up, and disposition recommendations are further outlined below.     We will continue to follow.     Diagnoses:   Active Hospital problems:  Active Problems:    Schizophrenia (CMS-HCC)    Cannabis use disorder, moderate,  dependence (CMS-HCC) Problems edited/added by me:  No problems updated.    Safety Risk Assessment:  The patient has demonstrated himself to be very impulsive and dangerous with multiple assaults over the past few days.      Recommendations:   ## Safety and Observation Level:   -- Based on the BSA or psychiatric evaluation, we estimate the patient to be at low risk for suicide in the current setting. We recommend routine level of observation on medical unit. This decision is based on my review of the chart including patient's history and current presentation, interview of the patient, mental status examination, and consideration of suicide risk including evaluating suicidal ideation, plan, intent, suicidal or self-harm behaviors, risk factors, and protective factors. This judgment is based on our ability to directly address suicide risk, implement suicide prevention strategies and develop a safety plan while the patient is in the clinical setting.  1:1 sitter ordered in context of IVC/dangerousness to others as outlined in note above and below.   -- Patient was initially placed on petition for Involuntary Commitment on 4/19. Decision to IVC was based on dangerousness to self (suicide or self-harm risk), dangerousness to others and dangerousness to self (probability of suffering serious physical debilitation within the near future unless adequate treatment is given). They will need to be re-evaluated and petition re-instated on 4/26 if appropriate. Patients on IVC must have a 1:1 sitter per hospital policy. Call hospital police if patient attempts to leave.  IVC paperwork was re-submitted 5/9.    ## Medications:   --CONTINUE haloperidol to 3mg  IV BID .  Please follow EKGs to monitor qTC  --CONTINUE Ativan 1 mg IV BID, administer at same time as scheduled haloperidol  --CONTINUE home lamictal 200 mg PO BID  --CONTINUE home propranolol XR (currently giving 30 mg BID)  --CONTINUE Depakote per neurology, 250 mg q AM + 1,000 mg nightly IV (repeat level 5/2 55.3)  -- For agitation:  -1st line PRN: haldol 2 mg IV BID PRN  (+/- Ativan 1 mg IV BID PRN to be given with IV haldol depending on team comfort (giving IM currently)  -- Continue thiamine PO/NGT supplementation daily   -- Continue folate supplementation and multivitamin daily   -- Agree with PLEX 5x over next 10 days to address elevated GAD65 ab of 163     ## Medical Decision Making Capacity:   -- Patient has been declared legally incompetent.  Please involve patient's guardian, Fount Fugere  at number listed in chart , for medical decision-making.  Ensure patient's guardianship paperwork has been uploaded to the medical chart and reviewed by hospital legal department.    ## Further Work-up:   -- Continue to work-up and Banks possible medical conditions that may be contributing to current presentation.   -- Recommend labs/studies including: TSH, Vit B12, Folate, UDS, EKG, CT head and EEG  -- While the patient is receiving medications (such as risperidone) that may prolong QTc and increase risk for torsades:     - MONITOR and KEEP Mg>2 and K>4      - MONITOR QTc regularly.  If QTc on tele strip >471ms, obtain 12-lead EKG. Please obtain 12 lead EKG ~q3Day interval  ## Disposition:   -- When patient is discharged, please ensure that their AVS includes information about the 46 Suicide & Crisis Lifeline.  -- Deferred at this time.   -- Patient has been declined for admission by [facility] given agitation/aggression. Ongoing discussions between primary team, psychiatry consults,  CM, about referrals for appropriate psychiatry placement once deemed medically appropriate    ## Behavioral / Environmental:   -- Please continue Delirium (prevention) protocol detailed in initial consult note.   -- Please limit number of labs, vital signs, and interactions with nursing/care staff as these interactions put staff in danger and seem triggering for patient   -- Please offer PRN as soon as situation begins to escalate, with replacement of restraints at onset of agitation/aggression to ensure patient safety   -- Continue 1:1 sitter for patient and staff safety  -- Continue to call behavioral responses and have hospital police involvement as needed while patient is on medical floor    Thank you for this consult request. Recommendations have been communicated to the primary team.  We will follow as needed at this time. Please page [phone] for any questions or concerns.     This patient was evaluated in person.    Patient was seen and plan of care discussed with and seen by attending physician, Dr.Kendyll Huettner, MD    Donia Ast, MD  PGY3 Psychiatry    I saw and evaluated the patient, participating in the key portions of the service.  I reviewed the resident???s note.  I agree with the resident???s findings and plan.     Hubert Azure, MD   Interval History:     Relevant events since last seen by psychiatry:     5/4: borderline hypotensive but asymptomatic. Off of precedex overnight, MRI brain completed and concerning for r>L cerebellar atrophy disproportionate for age, no abnormal enhancement. ENA/ANA in process. cvEEG with bilateral temporal sharps or rhythmic theta, none since since 5/3, lots of artifact from nystagmus, discontinued 5/4. ICU will attempt next PLEX session with PRN's only (vs precedex as previously). No physical violence, one dose haldol/benadryl/ativan PRN given overnight.   5/10 patient attempted to elope while confused and required restraints and IV haldol/ativan  5/11 RR for seizure like activity, felt by primary team to be anxiety  ________________________________________________________    Patient Interview:  Patient seen in bed with bilateral upper extremity restraints. Reports he is doing good this morning. Reports he feels team is treating him well. Continues to report that the other day he was getting shocked by people at the hospital and he wanted to warn other patients of being shocked but denies trying to leave the hospital since. Reports he would like to go home. Denies SI/HI/AVH. Discussed possibility of going to psychiatric unit versus going home if patient is able to demonstrate good behavior. Patient reports he would very much like to have the opportunity to demonstrate good behavior over the weekend and next week in hopes he could go home. Explained to patient that this would mean not trying to leave the hospital and not having conflicts with staff. Patient was understanding and amenable to plan. Discussed changing medications to get patient back on home regimen and patient reported being amenable to whatever his physicians want to do. Reports he takes whatever medications are given to him and has no issues with medications.     ROS:   All systems reviewed as negative/unremarkable aside from the following pertinent positives and negatives: as above    Collateral:   - Reviewed medical records in Epic   Collateral: ACT team [id number]) Spoke with nurse with ACT team 11:49AM - 12:00pm  Team intends on seeing patient when he leaves hospital. Spoke with dad on Tuesday and dad was under impression he would not be with ACT.  Confirmed risperdal 3mg  BID prior to hospital. Felt like he was stable at that time but had delusions at times and disorganized spiritual ideas. Some grandiose thinking. Was not on LAI. Reports significant marijuana/substance use. Reports psychiatrist that was providing care for patient left the practice so new psychiatrist has never met patient.     Relevant Updates to past psychiatric, medical/surgical, family, or social history: none    Current Medications:  Scheduled Meds:   cenobamate  400 mg Oral At bedtime    calcium chloride        calcium chloride        diphenhydrAMINE  12.5 mg Intravenous BID    valproate sodium  1,000 mg Intravenous Nightly    Or    divalproex ER  1,000 mg Oral Nightly    valproate sodium  500 mg Intravenous Daily before breakfast    Or    divalproex ER  500 mg Oral Daily before breakfast    enoxaparin (LOVENOX) injection  40 mg Subcutaneous Q24H SCH    folic acid  1 mg Oral Daily    haloperidol lactate  3 mg Intravenous BID    And    LORazepam  1 mg Intravenous BID    heparin, porcine (PF)        heparin, porcine (PF)        lamoTRIgine  200 mg Oral BID    melatonin  3 mg Oral QPM    multivitamins, therapeutic with minerals  1 tablet Oral Daily    polyethylene glycol  17 g Oral Daily    propranoloL  20 mg Oral BID    senna  2 tablet Oral Nightly    thiamine mononitrate (vit B1)  100 mg Oral Daily      sodium chloride 200 mL/hr (04/05/22 1022)    sodium chloride Stopped (04/05/22 1741)       PRN Meds:.acetaminophen, haloperidol lactate **AND** diphenhydrAMINE, LORazepam, ondansetron      Objective:   Vital signs:   Temp:  [36.7 ??C (98 ??F)-37.1 ??C (98.8 ??F)] 36.7 ??C (98.1 ??F)  Heart Rate:  [72-99] 72  SpO2 Pulse:  [71-98] 71  Resp:  [12-36] 12  BP: (94-169)/(41-122) 133/80  MAP (mmHg):  [57-132] 93  SpO2:  [97 %-100 %] 100 %    Physical Exam:  Gen: no acute distress, sitting up in bed  Resp: breathing comfortably on room air  Cardio: well perfused  Neuro:  gait deferred ts    Mental Status Exam:     Appearance:  Lying in bed, no distress   Attitude:   calm and cooperative   Behavior/Psychomotor:  No abnormal psychomotor activity    Speech/Language:    normal rate and volume   Mood:  Good   Affect:  calm, cooperative, euthymic, irritable at times   Thought process:  logical and linear   Thought content:    Denies SI, HI, self-harm, AVH, paranoia.   Perceptual disturbances:   behavior not concerning for response to internal stimuli   Attention:  Able to complete days of week backwards   Concentration:  Distractible   Orientation:  oriented to self, [facility] hospital   Memory:  not formally tested, but grossly intact   Fund of knowledge:   not formally assessed   Insight:     poor   Judgment:   poor   Impulse Control:   impaired       Data Reviewed:  I reviewed labs from the last 24 hours.  I reviewed imaging reports from the last 24 hours.      CT head 4/18: no acute abnormalities, though noted by neurology to have atrophy greater than expected for age with cerebellum more affected than cerebral cortex.     CT C/A/P 4/18: no abnormal lesions/masses     CVEEG 5/3:  Focal slowing: Yes  Higher voltage theta slowing was seen intermittently and independently over the right and left,maximal temporal regions, maximal right temporal.      At times seen in brief bursts and sharply contoured about 1-2.5 seconds, BIRDS     Epileptiform Activity: Yes  Location: Independently over the right and left temporal regions.  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       Unchanged right paranasal sinusitis.       Additional Psychometric Testing:  Not applicable.

## 2022-04-06 NOTE — Unmapped (Signed)
Neurology Inpatient Team B (NMB)  Daily Progress Note       Patient: Austin Banks  Code Status: Full Code  Level of Care: Step-down status.   LOS: 24 days      Overnight Events & Subjective:     - Tolerated Cytoxan well on 5/11  - Behavioral episode (rapid called due to concern for seizure-like activity but episode was anxiety related - see significant event note) 5/11 late afternoon      Physical Exam:     General Exam:   General Appearance: lying in bed. In NAD   Lungs: Normal work of breathing.    Extremities: No clubbing or cyanosis.     Neurological Exam:   Mental Status:   Alert. Affect is somewhat inappropriately pleasant but patient is calm, stating I am going home today. When examiner states he will not be going home, he extends all of his extremities and remains tense with mild shaking of arms and legs for about 5 seconds as he yells whaaaat?!. He then relaxes and is able to engage in conversation calmly again. He is able to state that he received Cytoxan yesterday and notes that treatment will now be once a month.      Cranial Nerves:   Mild L facial droop at rest (baseline).   EOM grossly intact.   Hearing intact to conversation.     Sensory exam:   Not done today. Previously light touch has been normal in the bilateral upper extremities and bilateral lower extremities.     Motor Exam:   Normal bulk   Moving all extremities spontaneously and equally although small ROM due to 4 point violent restraints in place.     Reflexes:   Deferred. Previously 1+ throughout bilaterally, Hoffmann's absent bilaterally, Toes down going bilaterally    Cerebellar/Coordination/Gai:  Deferred. Previously observed taking a few steps around bed in hospital room, unassisted.        Assessment/Plan:     Assessment: Austin Banks is a 31 y.o. 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 He was found to have elevated GAD 65 antibody level compared to prior so he was treated for a flare however he has not had any seizures, (clinically or electrographically) which is the primary manifestation of his GAD 65 disease. His primary inpatient issue has been paranoid delusions with behavioral outbursts. He has now completed a series of plasma exchange pheresis for GAD 65 flare and started Cytoxan for maintenance therapy. His neurologic treatment is complete but he remains hospitalized due to ongoing paranoid delusions with poor insight, judgement and impulse control that make discharge unsafe.        # Dysarthria - AMS - Imbalance - Difficulty Ambulating: Patient presented with 2 weeks of worsening balance and fluctuating mental status in the setting of recent medication changes, frequent marijuana use and falls associated with seizure and subsequent head injury. His symptoms are likely medication and drug related given the multiple CNS sedating meds he requires for seizure control with concurrent drug use. Other than the dysarthria which has improved and the multi-directional nystagmus, there are no other focal neurological findings on exam. With his history of medically refractory epilepsy and questionable compliance, it was important to rule out subclinical events  contributing to his fluctuating mental status even though no clinical seizures had been observed prior to admission. During admission, there have been no clinical seizures and no seizures seen on EEG monitoring.     # GAD 65 c/b focal to bilateral tonic clonic seizures: Requires multiple medications for control, recently underwent evaluation for DBS vs VNS candidacy. Patient endorses compliance with ASMs, however, he spends half the week with friends per father, partying and smoking marijuana and so compliance is questionable. He has had subtherapeutic levels of Lamictal in the past. Valproic acid was obtained on admission but collected after he received 500mg  IV. Last Rituxan dose was 4/24. VPA level was 74. UTox positive for cannabinoids and benzos. cvEEG (03/14/22 0430 to 03/15/22 11:30 AM ): There were bilateral independent (L>>R) temporal epileptiform discharges indicating focal cortical irritation and increased potential for seizure activity over there regions. There was moderate encephalopathy which is non-specific in etiology. There are no seizures. EEG discontinued on 4/20; restarted on 5/2 x2 days with bitemporal sharp waves and R>L temporal slowing. These EEG findings do not warrant escalation of ASM's nor do they explain patient's ongoing psychiatric symptoms. Previously treated with Onfi, which was discontinued due to oversedation and Fycompa due to reports of potentially increased agitation with use (i4/20, dc 4/27). 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 No abnormal enhancement GAD 65 ab elevated to 163 on 4/19; was previously 40.4 in June 2021. Patient was treated for acute GAD 65 flare with PLEx x5 (5/2 - 5/9). Maintenance Cytoxan therapy was initiated on 5/11.      Plan:  - Continue home lamictal 200mg  BID  - Continue IV valproate to 500mg  qAM + 1000mg  nightly at 6pm for seizure and behavioral management   - Continue haldol 3mg  bid standing dose, benadryl 12.5 mg bid standing dose, lorazepam 1mg  bid standing dose   - Continue cenobamate 400mg  at bedtime   - Repeat GAD 65 Ab 10 days after last PLEX session (5/19)  - Mnthly cycles of cyclophosphamide with MESNA for 3-6 months (see 5/9 progress note for references and Cytoxan/MESNA dosing details)  - After 3-6 months, likely plan to switch to Actemra    # Weight Loss - Poor Appetite: Patients father reports ~ 30lb weight loss over the past 6 months. Weight was 87kg in 06/2021, 77.7kg this admission. With his know GAD 65 positivity and tobacco abuse, malignancy screening with CT chest, abdomen and pelvis was obtained and unremarkable. Poor appetite could also be medication and/or tobacco use related.  - Nutrition consulted     # Psychosis - Schizophrenia: IVC currently in place. Behavioral response called for increasing combativeness in attempt to elope requiring IM Ativan 2mg , followed by Haldol. Patient seems emotionally labile-appearing calm amenable to admission on initial evaluation but after his father left the bedside he became agitated, loud, refusing admission and encroaching on examiner's personal space. Patient has required multiple psychiatric admissions in the past most recently held in our Psych holding unit in 04/2021 for behavioral problems. Of note, patient has ongoing significant behavioral issues including paranoia and attempts to leave hospital, requiring 4 point restraints. His GAD-65 disease is not likely the primary etiology of his severe psychiatric symptoms, which continue to be the barrier to his discharge. Psychiatry is following.   -- Psychiatry consulted, appreciate recs  - IV Depakote 500mg  qAM + 1000mg  nightly (at 6pm) as above   - Depakote levels:   - 4/25 subtherapeutic  38.6   - 4/28 therapeutic at 68.1   - 4/29 subtherapeutic at 48.7              - 5/2 therapeutic at 55.3              - 5/5 therapeutic at 60.6  - Haldol 3mg  BID standing dose (9AM, 6PM) (increased 5/10)  - Continue Benadryl 12.5 mg BID standing dose (9AM, 6PM)  - Continue lorazepam 1mg  BID standing dose (9AM, 6PM)  - Continue propranolol 20mg  BID in place of home propranolol XR 60mg  daily  - CONTINUE home lamictal 200 mg PO BID  - HOLD home trazodone 200 mg nightly  -- Agitation PRNs:   -- Continue Haldol 2mg  IV bid PRN   -- Continue Ativan 1mg  IV bid PRN   -- Continue Benadryl 12.5mg  IV bid PRN  -- continue thiamine PO/NGT supplementation daily   -- continue folate supplementation and multivitamin daily   -- continue telemetry monitoring    - EKG to monitor QTc (399 on 5/10)     # Polysubstance Use: Seen in [facility]. He has been referred to smoking cessation program. Patient endorses daily tobacco use and marijuana use (when visiting friends weekly). Denies recent cocaine use. Utox (+cannabinoids + benzos).  - Nicotine patch daily though patient refuses.      Discharge Planning:   - Case management: consulted. Recommendations appreciated.  - Social work: N/A  - PT: consulted, 5xL  - OT: consulted, 3x  - SLP: 3x  - Expected Discharge Disposition: TBD. Referral for [facility] placed. Expanded referrals statewide for inpatient psych admission.      Checklist:  - Diet: Regular diet  - IV fluids: none  - Bowel Regimen: senna  - GI PPX: No GI indications  - DVT PPX: Lovenox 40 mg [place] daily  - Lines/Access:   Patient Lines/Drains/Airways Status       Active Active Lines, Drains, & Airways       Name Placement date Placement time Site Days    Peripheral IV 03/31/22 Anterior;Left Forearm 03/31/22  1800  Forearm  5                  - Foley: No     Patient seen and discussed with attending physician, Dr. Dan Humphreys who is in agreement with the assessment and plan.      Neurology Team B resident can be reached at [phone].    Alwyn Pea, MD   Neurology, PGY-2     Data Review:     Contact Information:  Family contact: Fender, Herder (Father)   [phone] (Mobile)    PCP: [facility]    Medications:  Scheduled medications:   ??? cenobamate  400 mg Oral At bedtime   ??? calcium chloride       ??? calcium chloride       ??? diphenhydrAMINE  12.5 mg Intravenous BID   ??? valproate sodium  1,000 mg Intravenous Nightly    Or   ??? divalproex ER  1,000 mg Oral Nightly   ??? valproate sodium  500 mg Intravenous Daily before breakfast    Or   ??? divalproex ER  500 mg Oral Daily before breakfast   ??? enoxaparin (LOVENOX) injection  40 mg Subcutaneous Q24H SCH   ??? folic acid  1 mg Oral Daily   ??? haloperidol lactate  3 mg Intravenous BID    And   ??? LORazepam  1 mg Intravenous BID   ???  heparin, porcine (PF)       ??? heparin, porcine (PF)       ??? lamoTRIgine  200 mg Oral BID   ??? melatonin  3 mg Oral QPM   ??? multivitamins, therapeutic with minerals  1 tablet Oral Daily   ??? polyethylene glycol  17 g Oral Daily   ??? propranoloL  20 mg Oral BID   ??? senna  2 tablet Oral Nightly   ??? thiamine mononitrate (vit B1)  100 mg Oral Daily     Continuous infusions:   ??? sodium chloride 200 mL/hr (04/05/22 1022)   ??? sodium chloride Stopped (04/05/22 1741)         PRN medications: acetaminophen, haloperidol lactate **AND** diphenhydrAMINE, LORazepam, ondansetron    24 hour vital signs:  Temp:  [36.7 ??C (98 ??F)-37.1 ??C (98.8 ??F)] 36.7 ??C (98.1 ??F)  Heart Rate:  [72-99] 75  SpO2 Pulse:  [71-98] 74  Resp:  [14-36] 22  BP: (94-169)/(41-122) 109/70  MAP (mmHg):  [57-132] 83  SpO2:  [97 %-100 %] 98 %    Ins and Outs:  No intake/output data recorded.    Laboratory values:  All Labs Last 24hrs:   Recent Results (from the past 24 hour(s))   CBC    Collection Time: 04/05/22  5:04 PM   Result Value Ref Range    WBC 7.3 3.6 - 11.2 10*9/L    RBC 4.15 (L) 4.26 - 5.60 10*12/L    HGB 13.2 12.9 - 16.5 g/dL    HCT 16.1 (L) 09.6 - 48.0 %    MCV 92.7 77.6 - 95.7 fL    MCH 31.7 25.9 - 32.4 pg    MCHC 34.2 32.0 - 36.0 g/dL    RDW 04.5 40.9 - 81.1 %    MPV 8.2 6.8 - 10.7 fL    Platelet 225 150 - 450 10*9/L   Comprehensive Metabolic Panel    Collection Time: 04/05/22  5:04 PM   Result Value Ref Range    Sodium 133 (L) 135 - 145 mmol/L    Potassium 4.9 (H) 3.4 - 4.8 mmol/L    Chloride 101 98 - 107 mmol/L    CO2 22.0 20.0 - 31.0 mmol/L    Anion Gap 10 5 - 14 mmol/L    BUN 8 (L) 9 - 23 mg/dL    Creatinine 9.14 7.82 - 1.10 mg/dL    BUN/Creatinine Ratio 10     eGFR CKD-EPI (2021) Male >90 >=60 mL/min/1.80m2    Glucose 115 70 - 179 mg/dL    Calcium 8.6 (L) 8.7 - 10.4 mg/dL    Albumin 4.4 3.4 - 5.0 g/dL    Total Protein 6.3 5.7 - 8.2 g/dL    Total Bilirubin 0.3 0.3 - 1.2 mg/dL    AST 29 <=95 U/L    ALT 28 10 - 49 U/L    Alkaline Phosphatase 30 (L) 46 - 116 U/L   Blood Gas Critical Care Panel, Venous    Collection Time: 04/05/22  5:04 PM   Result Value Ref Range    Specimen Source Venous     FIO2 Venous Room Air     pH, Venous 7.48 (H) 7.32 - 7.43    pCO2, Ven 30 (L) 40 - 60 mm Hg    pO2, Ven 59 (H) 30 - 55 mm Hg    HCO3, Ven 22 22 - 27 mmol/L    Base Excess, Ven -1.1 -2.0 - 2.0    O2 Saturation, Venous  92.3 (H) 40.0 - 85.0 %    Sodium Whole Blood 129 (L) 135 - 145 mmol/L    Potassium, Bld 4.7 (H) 3.4 - 4.6 mmol/L    Calcium, Ionized Venous 4.28 (L) 4.40 - 5.40 mg/dL    Glucose Whole Blood 117 70 - 179 mg/dL    Lactate, Venous 3.4 (H) 0.5 - 1.8 mmol/L    Hgb, blood gas 13.40 (L) 13.50 - 17.50 g/dL   hsTroponin I (single, no delta)    Collection Time: 04/05/22  5:04 PM   Result Value Ref Range    hsTroponin I 4 <=53 ng/L   ECG 12 Lead    Collection Time: 04/05/22  5:33 PM   Result Value Ref Range    EKG Systolic BP  mmHg    EKG Diastolic BP  mmHg    EKG Ventricular Rate 71 BPM    EKG Atrial Rate 71 BPM    EKG P-R Interval 136 ms    EKG QRS Duration 86 ms    EKG Q-T Interval 400 ms    EKG QTC Calculation 434 ms    EKG Calculated P Axis 15 degrees    EKG Calculated R Axis 53 degrees    EKG Calculated T Axis 42 degrees    QTC Fredericia 423 ms   Basic Metabolic Panel    Collection Time: 04/06/22  5:50 AM   Result Value Ref Range    Sodium 141 135 - 145 mmol/L    Potassium 4.1 3.4 - 4.8 mmol/L    Chloride 106 98 - 107 mmol/L    CO2 25.0 20.0 - 31.0 mmol/L    Anion Gap 10 5 - 14 mmol/L    BUN 6 (L) 9 - 23 mg/dL    Creatinine 1.61 0.96 - 1.10 mg/dL    BUN/Creatinine Ratio 9     eGFR CKD-EPI (2021) Male >90 >=60 mL/min/1.53m2    Glucose 86 70 - 179 mg/dL    Calcium 8.9 8.7 - 04.5 mg/dL   CBC    Collection Time: 04/06/22  5:50 AM   Result Value Ref Range    WBC 5.5 3.6 - 11.2 10*9/L    RBC 3.97 (L) 4.26 - 5.60 10*12/L    HGB 12.8 (L) 12.9 - 16.5 g/dL    HCT 40.9 (L) 81.1 - 48.0 %    MCV 93.5 77.6 - 95.7 fL    MCH 32.1 25.9 - 32.4 pg    MCHC 34.3 32.0 - 36.0 g/dL    RDW 91.4 78.2 - 95.6 %    MPV 8.6 6.8 - 10.7 fL    Platelet 236 150 - 450 10*9/L             Imaging:  Pertinent imaging discussed in the A/P section

## 2022-04-06 NOTE — Unmapped (Signed)
Pt educated on violent 4pt restraints, sitter bedside per elopement protocols. Pt follows commands, no outbursts noted during shift nor any threatening speech. Will pass along to night shift patient is moving in right direction, however with elopement incident should probably keep in 4 pt overnight. ARRT called for episode shaking, r/o seizures, never lost consciousness and was following commands, labs critical care labs drawn and ativan administered. Pt's symptoms did resolve after 15 mins or so during rapid. Cytoxan given, tolerated well.   Problem: Adult Inpatient Plan of Care  Goal: Plan of Care Review  Outcome: Progressing  Goal: Patient-Specific Goal (Individualized)  Outcome: Progressing  Goal: Absence of Hospital-Acquired Illness or Injury  Outcome: Progressing  Intervention: Identify and Manage Fall Risk  Recent Flowsheet Documentation  Taken 04/05/2022 1800 by Alona Bene, RN  Safety Interventions:   elopement precautions   low bed   sitter at bedside  Taken 04/05/2022 1600 by Alona Bene, RN  Safety Interventions:   elopement precautions   low bed   sitter at bedside  Taken 04/05/2022 1400 by Alona Bene, RN  Safety Interventions:   elopement precautions   low bed   sitter at bedside  Taken 04/05/2022 1200 by Alona Bene, RN  Safety Interventions:   elopement precautions   low bed   sitter at bedside  Taken 04/05/2022 1000 by Alona Bene, RN  Safety Interventions:   elopement precautions   bed alarm   sitter at bedside  Taken 04/05/2022 0800 by Alona Bene, RN  Safety Interventions:   elopement precautions   bed alarm   low bed   sitter at bedside  Intervention: Prevent and Manage VTE (Venous Thromboembolism) Risk  Recent Flowsheet Documentation  Taken 04/05/2022 0800 by Alona Bene, RN  Activity Management: bedrest  Goal: Optimal Comfort and Wellbeing  Outcome: Progressing  Goal: Readiness for Transition of Care  Outcome: Progressing  Goal: Rounds/Family Conference  Outcome: Progressing     Problem: Seizure Disorder Comorbidity  Goal: Maintenance of Seizure Control  Outcome: Progressing     Problem: Self-Care Deficit  Goal: Improved Ability to Complete Activities of Daily Living  Outcome: Progressing     Problem: Violence Risk or Actual  Goal: Anger and Impulse Control  Outcome: Progressing     Problem: VTE (Venous Thromboembolism)  Goal: VTE (Venous Thromboembolism) Symptom Resolution  Outcome: Progressing  Intervention: Prevent or Manage VTE (Venous Thromboembolism)  Recent Flowsheet Documentation  Taken 04/05/2022 1800 by Alona Bene, RN  Anti-Embolism Intervention: Refused  Taken 04/05/2022 1600 by Alona Bene, RN  Anti-Embolism Intervention: Refused  Taken 04/05/2022 1529 by Alona Bene, RN  Anti-Embolism Intervention: Refused  Taken 04/05/2022 1400 by Alona Bene, RN  Anti-Embolism Intervention: Refused  Taken 04/05/2022 1200 by Alona Bene, RN  Anti-Embolism Intervention: Refused  Taken 04/05/2022 1000 by Alona Bene, RN  Anti-Embolism Intervention: Refused  Taken 04/05/2022 0800 by Alona Bene, RN  Anti-Embolism Intervention: Refused     Problem: Seizure, Active Management  Goal: Absence of Seizure/Seizure-Related Injury  Outcome: Progressing     Problem: Adjustment to Illness (Delirium)  Goal: Optimal Coping  Outcome: Progressing     Problem: Altered Behavior (Delirium)  Goal: Improved Behavioral Control  Outcome: Progressing     Problem: Attention and Thought Clarity Impairment (Delirium)  Goal: Improved Attention and Thought Clarity  Outcome: Progressing     Problem: Sleep Disturbance (Delirium)  Goal: Improved Sleep  Outcome: Progressing  Problem: Fall Injury Risk  Goal: Absence of Fall and Fall-Related Injury  Outcome: Progressing  Intervention: Promote Scientist, clinical (histocompatibility and immunogenetics) Documentation  Taken 04/05/2022 1800 by Alona Bene, RN  Safety Interventions:   elopement precautions   low bed sitter at bedside  Taken 04/05/2022 1600 by Alona Bene, RN  Safety Interventions:   elopement precautions   low bed   sitter at bedside  Taken 04/05/2022 1400 by Alona Bene, RN  Safety Interventions:   elopement precautions   low bed   sitter at bedside  Taken 04/05/2022 1200 by Alona Bene, RN  Safety Interventions:   elopement precautions   low bed   sitter at bedside  Taken 04/05/2022 1000 by Alona Bene, RN  Safety Interventions:   elopement precautions   bed alarm   sitter at bedside  Taken 04/05/2022 0800 by Alona Bene, RN  Safety Interventions:   elopement precautions   bed alarm   low bed   sitter at bedside     Problem: Skin Injury Risk Increased  Goal: Skin Health and Integrity  Outcome: Progressing  Intervention: Optimize Skin Protection  Recent Flowsheet Documentation  Taken 04/05/2022 0800 by Alona Bene, RN  Head of Bed [facility]) Positioning: HOB elevated

## 2022-04-06 NOTE — Unmapped (Signed)
Pain (AOx4)- Questioned patient about pain at frequent intervals, using a self report method. Patientdescribed past experiences with pain, and the effectiveness of methods used to manage pain, includingexperiences with side effects, typical coping responses, and the manner in which the patient expresses pain. Patient used pain rating scale to identify current pain intensity and determined comfort/functiongoal. Patient demonstrated the ability to obtain sufficient amounts of rest and sleep.    Problem: Violent/Self-Destructive Restraints  Intervention: Utilize least restrictive measures  Recent Flowsheet Documentation  Taken 04/06/2022 0600 by Elenora Gamma, RN  Less Restrictive Alternative:   1:1 patient care   Repositioning   Comfort Measures   Pain management   Alarm  Risk Factors: Pre-existing medical conditions  Taken 04/06/2022 0400 by Elenora Gamma, RN  Less Restrictive Alternative:   1:1 patient care   Repositioning   Comfort Measures   Pain management   Alarm  Risk Factors: Pre-existing medical conditions  Taken 04/06/2022 0200 by Elenora Gamma, RN  Less Restrictive Alternative:   1:1 patient care   Repositioning   Comfort Measures   Pain management   Alarm  Risk Factors: Pre-existing medical conditions  Taken 04/06/2022 0000 by Elenora Gamma, RN  Less Restrictive Alternative:   1:1 patient care   Repositioning   Comfort Measures   Pain management   Alarm  Risk Factors: Pre-existing medical conditions  Taken 04/05/2022 2200 by Elenora Gamma, RN  Less Restrictive Alternative:   1:1 patient care   Repositioning   Comfort Measures   Pain management   Alarm  Risk Factors: Pre-existing medical conditions  Taken 04/05/2022 2000 by Elenora Gamma, RN  Less Restrictive Alternative:   1:1 patient care   Repositioning   Comfort Measures   Pain management   Alarm  Risk Factors: Pre-existing medical conditions

## 2022-04-07 MED ADMIN — diphenhydrAMINE (BENADRYL) injection: 12.5 mg | INTRAVENOUS | @ 15:00:00

## 2022-04-07 MED ADMIN — polyethylene glycol (MIRALAX) packet 17 g: 17 g | ORAL | @ 15:00:00

## 2022-04-07 MED ADMIN — divalproex ER (DEPAKOTE ER) extended released 24 hr tablet 1,000 mg: 1000 mg | ORAL | @ 23:00:00

## 2022-04-07 MED ADMIN — LORazepam (ATIVAN) injection 1 mg: 1 mg | INTRAVENOUS | @ 22:00:00

## 2022-04-07 MED ADMIN — haloperidol lactate (HALDOL) injection 3 mg: 3 mg | INTRAVENOUS | @ 22:00:00

## 2022-04-07 MED ADMIN — enoxaparin (LOVENOX) syringe 40 mg: 40 mg | SUBCUTANEOUS | @ 01:00:00

## 2022-04-07 MED ADMIN — divalproex ER (DEPAKOTE ER) extended released 24 hr tablet 500 mg: 500 mg | ORAL | @ 15:00:00

## 2022-04-07 MED ADMIN — senna (SENOKOT) tablet 2 tablet: 2 | ORAL | @ 01:00:00 | Stop: 2022-04-11

## 2022-04-07 MED ADMIN — melatonin tablet 3 mg: 3 mg | ORAL | @ 23:00:00

## 2022-04-07 MED ADMIN — haloperidol lactate (HALDOL) injection 3 mg: 3 mg | INTRAVENOUS | @ 15:00:00

## 2022-04-07 MED ADMIN — propranoloL (INDERAL) tablet 20 mg: 20 mg | ORAL | @ 15:00:00

## 2022-04-07 MED ADMIN — thiamine mononitrate (vit B1) tablet 100 mg: 100 mg | ORAL | @ 15:00:00

## 2022-04-07 MED ADMIN — lamoTRIgine (LaMICtal) tablet 200 mg: 200 mg | ORAL | @ 23:00:00

## 2022-04-07 MED ADMIN — diphenhydrAMINE (BENADRYL) injection: 12.5 mg | INTRAVENOUS | @ 22:00:00

## 2022-04-07 MED ADMIN — multivitamins, therapeutic with minerals tablet 1 tablet: 1 | ORAL | @ 15:00:00

## 2022-04-07 MED ADMIN — propranoloL (INDERAL) tablet 20 mg: 20 mg | ORAL | @ 01:00:00

## 2022-04-07 MED ADMIN — LORazepam (ATIVAN) injection 1 mg: 1 mg | INTRAVENOUS | @ 15:00:00

## 2022-04-07 MED ADMIN — folic acid (FOLVITE) tablet 1 mg: 1 mg | ORAL | @ 15:00:00

## 2022-04-07 MED ADMIN — lamoTRIgine (LaMICtal) tablet 200 mg: 200 mg | ORAL | @ 15:00:00

## 2022-04-07 NOTE — Unmapped (Signed)
Patient has been thoroughly oriented to the environment. Pt educated on how to use the callbell, with call bell maintained within reach at all times. Bed maintained in lowest locked position. Dim light maintained in room while patient sleeping. Bed Alarm kept on during shift. Falls risk assessmentcomplete. Glasses and sensory aid used when appropriate. Nonskid footwear maintained. Pain (AOx4)- Questioned patient about pain at frequent intervals, using a self report method. Patientdescribed past experiences with pain, and the effectiveness of methods used to manage pain, includingexperiences with side effects, typical coping responses, and the manner in which the patient expresses pain. Patient used pain rating scale to identify current pain intensity and determined comfort/functiongoal. Patient demonstrated the ability to obtain sufficient amounts of rest and sleep.      Problem: Adult Inpatient Plan of Care  Goal: Plan of Care Review  Outcome: Progressing  Goal: Patient-Specific Goal (Individualized)  Outcome: Progressing  Goal: Absence of Hospital-Acquired Illness or Injury  Outcome: Progressing  Goal: Optimal Comfort and Wellbeing  Outcome: Progressing  Goal: Readiness for Transition of Care  Outcome: Progressing  Goal: Rounds/Family Conference  Outcome: Progressing

## 2022-04-07 NOTE — Unmapped (Signed)
Discontinued ankle restraints, remains in 2 point violent restraints. Pt has been Aox4, calm and cooperative today. Sometimes forgetful or needs reminding. VSS. Voiding in urinal, using bedpan for Bms. Took all meds. Bedside attendant dc'd, telesitter now in place.      Problem: Adult Inpatient Plan of Care  Goal: Plan of Care Review  Outcome: Ongoing - Unchanged  Goal: Patient-Specific Goal (Individualized)  Outcome: Ongoing - Unchanged  Goal: Absence of Hospital-Acquired Illness or Injury  Outcome: Ongoing - Unchanged  Intervention: Identify and Manage Fall Risk  Recent Flowsheet Documentation  Taken 04/06/2022 0800 by Kandee Keen, RN  Safety Interventions:   aspiration precautions   fall reduction program maintained   safety attendant   seizure precautions  Intervention: Prevent Skin Injury  Recent Flowsheet Documentation  Taken 04/06/2022 0800 by Kandee Keen, RN  Skin Protection:   adhesive use limited   tubing/devices free from skin contact  Intervention: Prevent and Manage VTE (Venous Thromboembolism) Risk  Recent Flowsheet Documentation  Taken 04/06/2022 0900 by Kandee Keen, RN  VTE Prevention/Management:   bleeding precautions maintained   fluids promoted  Intervention: Prevent Infection  Recent Flowsheet Documentation  Taken 04/06/2022 0800 by Kandee Keen, RN  Infection Prevention:   hand hygiene promoted   single patient room provided  Goal: Optimal Comfort and Wellbeing  Outcome: Ongoing - Unchanged  Goal: Readiness for Transition of Care  Outcome: Ongoing - Unchanged  Goal: Rounds/Family Conference  Outcome: Ongoing - Unchanged     Problem: Self-Care Deficit  Goal: Improved Ability to Complete Activities of Daily Living  Outcome: Ongoing - Unchanged     Problem: VTE (Venous Thromboembolism)  Goal: VTE (Venous Thromboembolism) Symptom Resolution  Outcome: Ongoing - Unchanged  Intervention: Prevent or Manage VTE (Venous Thromboembolism)  Recent Flowsheet Documentation  Taken 04/06/2022 1800 by Kandee Keen, RN  Anti-Embolism Intervention: Off  Taken 04/06/2022 1600 by Kandee Keen, RN  Anti-Embolism Intervention: Off  Taken 04/06/2022 1400 by Kandee Keen, RN  Anti-Embolism Intervention: Off  Taken 04/06/2022 1200 by Kandee Keen, RN  Anti-Embolism Intervention: Off  Taken 04/06/2022 0900 by Kandee Keen, RN  VTE Prevention/Management:   bleeding precautions maintained   fluids promoted  Anti-Embolism Intervention: Off  Taken 04/06/2022 0800 by Kandee Keen, RN  Bleeding Precautions:   blood pressure closely monitored   coagulation study results reviewed     Problem: Sleep Disturbance (Delirium)  Goal: Improved Sleep  Outcome: Ongoing - Unchanged     Problem: Fall Injury Risk  Goal: Absence of Fall and Fall-Related Injury  Outcome: Ongoing - Unchanged  Intervention: Promote Injury-Free Environment  Recent Flowsheet Documentation  Taken 04/06/2022 0800 by Kandee Keen, RN  Safety Interventions:   aspiration precautions   fall reduction program maintained   safety attendant   seizure precautions     Problem: Skin Injury Risk Increased  Goal: Skin Health and Integrity  Outcome: Ongoing - Unchanged  Intervention: Optimize Skin Protection  Recent Flowsheet Documentation  Taken 04/06/2022 0800 by Kandee Keen, RN  Pressure Reduction Techniques:   frequent weight shift encouraged   weight shift assistance provided  Pressure Reduction Devices: pressure-redistributing mattress utilized  Skin Protection:   adhesive use limited   tubing/devices free from skin contact     Problem: Seizure Disorder Comorbidity  Goal: Maintenance of Seizure Control  Outcome: Progressing  Intervention: Maintain Seizure-Symptom Control  Recent Flowsheet Documentation  Taken 04/06/2022 0800 by Kandee Keen, RN  Seizure Precautions:   activity supervised   clutter-free environment maintained  Problem: Violence Risk or Actual  Goal: Anger and Impulse Control  Outcome: Progressing     Problem: Seizure, Active Management  Goal: Absence of Seizure/Seizure-Related Injury  Outcome: Progressing  Intervention: Prevent Seizure-Related Injury  Recent Flowsheet Documentation  Taken 04/06/2022 0800 by Kandee Keen, RN  Seizure Precautions:   activity supervised   clutter-free environment maintained     Problem: Adjustment to Illness (Delirium)  Goal: Optimal Coping  Outcome: Progressing     Problem: Altered Behavior (Delirium)  Goal: Improved Behavioral Control  Outcome: Progressing     Problem: Attention and Thought Clarity Impairment (Delirium)  Goal: Improved Attention and Thought Clarity  Outcome: Progressing  Intervention: Maximize Cognitive Function  Recent Flowsheet Documentation  Taken 04/06/2022 0900 by Kandee Keen, RN  Reorientation Measures: reorientation provided     Problem: Violent/Self-Destructive Restraints  Intervention: Utilize least restrictive measures  Recent Flowsheet Documentation  Taken 04/06/2022 1800 by Kandee Keen, RN  Less Restrictive Alternative: 1:1 patient care  Risk Factors: Pre-existing medical conditions  Taken 04/06/2022 1600 by Kandee Keen, RN  Less Restrictive Alternative: 1:1 patient care  Risk Factors: Pre-existing medical conditions  Taken 04/06/2022 1450 by Kandee Keen, RN  Less Restrictive Alternative: 1:1 patient care  Risk Factors: Pre-existing medical conditions  Taken 04/06/2022 1400 by Kandee Keen, RN  Less Restrictive Alternative: 1:1 patient care  Risk Factors: Pre-existing medical conditions  Taken 04/06/2022 1200 by Kandee Keen, RN  Less Restrictive Alternative: 1:1 patient care  Risk Factors: Pre-existing medical conditions  Taken 04/06/2022 1006 by Kandee Keen, RN  Less Restrictive Alternative: 1:1 patient care  Risk Factors: Pre-existing medical conditions  Taken 04/06/2022 1000 by Kandee Keen, RN  Less Restrictive Alternative: 1:1 patient care  Risk Factors: Pre-existing medical conditions  Taken 04/06/2022 0800 by Kandee Keen, RN  Less Restrictive Alternative: 1:1 patient care  Risk Factors: Pre-existing medical conditions  Intervention: Patient Monitoring  Recent Flowsheet Documentation  Taken 04/06/2022 1800 by Kandee Keen, RN  Circulation/Skin Integrity: No signs of injury  Range of Motion: Performed  Fluids: Offered  Food/Meal: Offered  Elimination: Offered  Taken 04/06/2022 1600 by Kandee Keen, RN  Circulation/Skin Integrity: No signs of injury  Range of Motion: Performed  Fluids: Offered  Food/Meal: Offered  Elimination: Offered  Taken 04/06/2022 1450 by Kandee Keen, RN  Circulation/Skin Integrity: No signs of injury  Range of Motion: Performed  Fluids: Offered  Food/Meal: Offered  Elimination: Offered  Taken 04/06/2022 1400 by Kandee Keen, RN  Circulation/Skin Integrity: No signs of injury  Range of Motion: Performed  Fluids: Offered  Food/Meal: Offered  Elimination: Offered  Taken 04/06/2022 1200 by Kandee Keen, RN  Circulation/Skin Integrity: No signs of injury  Range of Motion: Performed  Fluids: Offered  Food/Meal: Offered  Elimination: Offered  Taken 04/06/2022 1006 by Kandee Keen, RN  Circulation/Skin Integrity: No signs of injury  Range of Motion: Performed  Fluids: Offered  Food/Meal: Offered  Elimination: Offered  Taken 04/06/2022 1000 by Kandee Keen, RN  Circulation/Skin Integrity: No signs of injury  Range of Motion: Performed  Fluids: Offered  Food/Meal: Offered  Elimination: Offered  Taken 04/06/2022 0800 by Kandee Keen, RN  Circulation/Skin Integrity: No signs of injury  Range of Motion: Performed  Fluids: Offered  Food/Meal: Offered  Elimination: Offered  Intervention: The continued need for restraints or less restrictive form of restraints will be assessed and documented.  Recent Flowsheet Documentation  Taken 04/06/2022 1800 by Kandee Keen, RN  Clinical Justification:   Imminent risk of harm to  self   Imminent risk of harm to others  Taken 04/06/2022 1600 by Kandee Keen, RN  Clinical Justification: Imminent risk of harm to self   Imminent risk of harm to others  Taken 04/06/2022 1450 by Kandee Keen, RN  Clinical Justification:   Imminent risk of harm to self   Imminent risk of harm to others  Taken 04/06/2022 1400 by Kandee Keen, RN  Clinical Justification:   Imminent risk of harm to others   Imminent risk of harm to self  Taken 04/06/2022 1200 by Kandee Keen, RN  Clinical Justification:   Imminent risk of harm to self   Imminent risk of harm to others  Taken 04/06/2022 1006 by Kandee Keen, RN  Clinical Justification:   Imminent risk of harm to self   Imminent risk of harm to others  Taken 04/06/2022 1000 by Kandee Keen, RN  Clinical Justification:   Imminent risk of harm to self   Imminent risk of harm to others  Taken 04/06/2022 0800 by Kandee Keen, RN  Clinical Justification: Imminent risk of harm to others  Intervention: Patient Education  Recent Flowsheet Documentation  Taken 04/06/2022 1450 by Kandee Keen, RN  Criteria Explained: Yes  Patient's Response: Communicates understanding  Family Notification: Patient declined notification  Taken 04/06/2022 1006 by Kandee Keen, RN  Criteria Explained: Yes  Patient's Response: Communicates understanding  Family Notification: Parent  Taken 04/06/2022 0800 by Kandee Keen, RN  Criteria Explained: Yes  Patient's Response: Communicates understanding  Family Notification: Parent

## 2022-04-07 NOTE — Unmapped (Signed)
Neurology Inpatient Team B (NMB)  Daily Progress Note       Patient: Austin Banks  Code Status: Full Code  Level of Care: Step-down status.   LOS: 25 days      Overnight Events & Subjective:     - [facility] referral paperwork returned to CM on 5/12  - Discontinued ankle restraints per discussion with nursing on 5/12; remained in 2 point violent restraints. Overnight, violent restraint orders were not renewed and this morning patient is unrestrained. Re-ordered 2 point violent restraints prior to rounds.   - No PRNs overnight  - Sitter was discontinued late in day on 5/12; replaced by telesitter  - Labs and EKG due tomorrow    - Some confusion with dispo plan per communication between [facility] charge and CM late in the day on 5/12. [facility] under impression that patient would discharge to [facility] Monday if he could be unrestrained and without PNA for 24 hours. Clarified with weekend CM today that patient has not been accepted to [facility] and that patient does not need to be free of restraints in order to be eligible for acceptance to [facility]. Ongoing discussions with psych regarding dispo options and need for 1:1. [facility] nursing staff comfortable with patient being unrestrained but prefer telesitter as opposed to 1:1. I am hesitant to discontinue restraints, however ultimately, defer to nursing for level of restraints required, as they are primarily for nursing safety. The plan this morning is to replace restraints for any inappropriate physical behaviors and to call behavioral response if patient exits his room. Further follow-up for dispo planning on Monday.         Physical Exam:     General Exam:   General Appearance: observed lying in bed. In NAD   Lungs: Normal work of breathing.    Extremities: No clubbing or cyanosis.     Neurological Exam:   Mental Status:   Alert, looking out window     Cranial Nerves:   Mild L facial droop at rest (baseline).   EOM grossly intact.   Hearing intact to conversation.     Sensory exam:   Not done today. Previously light touch has been normal in the bilateral upper extremities and bilateral lower extremities.     Motor Exam:   Normal bulk   Moving all extremities spontaneously and equally against gravity     Reflexes:   Deferred. Previously 1+ throughout bilaterally, Hoffmann's absent bilaterally, Toes down going bilaterally    Cerebellar/Coordination/Gai:  Deferred. Previously observed taking a few steps around bed in hospital room, unassisted.        Assessment/Plan:     Assessment: Austin Banks is a 31 y.o. 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 He was found to have elevated GAD 65 antibody level compared to prior so he was treated for a flare however he has not had any seizures, (clinically or electrographically) which is the primary manifestation of his GAD 65 disease. His primary inpatient issue has been paranoid delusions with behavioral outbursts. He has now completed a series of plasma exchange pheresis for GAD 65 flare and started Cytoxan for maintenance therapy. His neurologic treatment is complete but he remains hospitalized due to ongoing paranoid delusions with poor insight, judgement and  impulse control that make discharge to home unsafe. He has no acute medical needs for ongoing hospitalization but will require psychiatric placement unless behavior improves and his parents would be amenable to him discharging home. At this time, his parents are in agreement with discharge to psychiatric facility.         # Dysarthria - AMS - Imbalance - Difficulty Ambulating: Patient presented with 2 weeks of worsening balance and fluctuating mental status in the setting of recent medication changes, frequent marijuana use and falls associated with seizure and subsequent head injury. His symptoms are likely medication and drug related given the multiple CNS sedating meds he requires for seizure control with concurrent drug use. Other than the dysarthria which has improved and the multi-directional nystagmus, there are no other focal neurological findings on exam. With his history of medically refractory epilepsy and questionable compliance, it was important to rule out subclinical events contributing to his fluctuating mental status even though no clinical seizures had been observed prior to admission. During admission, there have been no clinical seizures and no seizures seen on EEG monitoring.     # GAD 65 c/b focal to bilateral tonic clonic seizures: Requires multiple medications for control, recently underwent evaluation for DBS vs VNS candidacy. Patient endorses compliance with ASMs, however, he spends half the week with friends per father, partying and smoking marijuana and so compliance is questionable. He has had subtherapeutic levels of Lamictal in the past. Valproic acid was obtained on admission but collected after he received 500mg  IV. Last Rituxan dose was 4/24. VPA level was 74. UTox positive for cannabinoids and benzos. cvEEG (03/14/22 0430 to 03/15/22 11:30 AM ): There were bilateral independent (L>>R) temporal epileptiform discharges indicating focal cortical irritation and increased potential for seizure activity over there regions. There was moderate encephalopathy which is non-specific in etiology. There are no seizures. EEG discontinued on 4/20; restarted on 5/2 x2 days with bitemporal sharp waves and R>L temporal slowing. These EEG findings do not warrant escalation of ASM's nor do they explain patient's ongoing psychiatric symptoms. Previously treated with Onfi, which was discontinued due to oversedation and Fycompa due to reports of potentially increased agitation with use (i4/20, dc 4/27). 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 No abnormal enhancement GAD 65 ab elevated to 163 on 4/19; was previously 40.4 in June 2021. Patient was treated for acute GAD 65 flare with PLEx x5 (5/2 - 5/9). Maintenance Cytoxan therapy was initiated on 5/11.      Plan:  - Continue home lamictal 200mg  BID  - Continue IV valproate to 500mg  qAM + 1000mg  nightly at 6pm for seizure and behavioral management   - Continue haldol 3mg  bid standing dose, benadryl 12.5 mg bid standing dose, lorazepam 1mg  bid standing dose   - Continue cenobamate 400mg  at bedtime   - Repeat GAD 65 Ab 10 days after last PLEX session (5/19)  - Mnthly cycles of cyclophosphamide with MESNA for 3-6 months (see 5/9 progress note for references and Cytoxan/MESNA dosing details)  - After 3-6 months, likely plan to switch to Actemra     # Psychosis - Schizophrenia: IVC currently in place. Behavioral response called for increasing combativeness in attempt to elope requiring IM Ativan 2mg , followed by Haldol. Patient seems emotionally labile-appearing calm amenable to admission on initial evaluation but after his father left the bedside he became agitated, loud, refusing admission and encroaching on examiner's personal space. Patient has required multiple  psychiatric admissions in the past most recently held in our Psych holding unit in 04/2021 for behavioral problems. Of note, patient has ongoing significant behavioral issues including paranoia and attempts to leave hospital, requiring 4 point restraints. There have been 3 episodes of violent outbursts against nursing staff during this hospitalization. His GAD-65 disease is not likely the primary etiology of his severe psychiatric symptoms, which continue to be the barrier to his discharge. Psychiatry is following.   -- Psychiatry consulted, appreciate recs  - IV Depakote 500mg  qAM + 1000mg  nightly (at 6pm) as above   - Depakote levels:   - 4/25 subtherapeutic 38.6   - 4/28 therapeutic at 68.1   - 4/29 subtherapeutic at 48.7              - 5/2 therapeutic at 55.3              - 5/5 therapeutic at 60.6  - Haldol 3mg  BID standing dose (9AM, 6PM) (increased 5/10)  - Continue Benadryl 12.5 mg BID standing dose (9AM, 6PM)  - Continue lorazepam 1mg  BID standing dose (9AM, 6PM)  - Continue propranolol 20mg  BID in place of home propranolol XR 60mg  daily  - CONTINUE home lamictal 200 mg PO BID  - HOLD home trazodone 200 mg nightly  -- Agitation PRNs:   -- Continue Haldol 2mg  IV bid PRN   -- Continue Ativan 1mg  IV bid PRN   -- Continue Benadryl 12.5mg  IV bid PRN  -- continue thiamine PO/NGT supplementation daily   -- continue folate supplementation and multivitamin daily   -- continue telemetry monitoring    - EKG to monitor QTc (423 on 5/11)     # Polysubstance Use: Seen in [facility]. He has been referred to smoking cessation program. Patient endorses daily tobacco use and marijuana use (when visiting friends weekly). Denies recent cocaine use. Utox (+cannabinoids + benzos).  - Nicotine patch daily though patient refuses.      Discharge Planning:   - Case management: consulted. Recommendations appreciated.  - Social work: N/A  - PT: consulted, 5xL  - OT: consulted, 3x  - SLP: 3x  - Expected Discharge Disposition: TBD. Referral for [facility] placed. Expanded referrals statewide for inpatient psych admission.      Checklist:  - Diet: Regular diet  - IV fluids: none  - Bowel Regimen: senna  - GI PPX: No GI indications  - DVT PPX: Lovenox 40 mg Reserve daily  - Lines/Access:   Patient Lines/Drains/Airways Status       Active Active Lines, Drains, & Airways       Name Placement date Placement time Site Days    Peripheral IV 03/31/22 Anterior;Left Forearm 03/31/22  1800  Forearm  6                  - Foley: No     Patient seen and discussed with attending physician, Dr. Dan Humphreys who is in agreement with the assessment and plan.      Neurology Team B resident can be reached at [phone].    Alwyn Pea, MD   Neurology, PGY-2     Data Review:     Contact Information:  Family contact: Leray, Garverick (Father)   [phone] (Mobile)    PCP: [facility]    Medications:  Scheduled medications:    cenobamate  400 mg Oral At bedtime    calcium chloride        calcium chloride  diphenhydrAMINE  12.5 mg Intravenous BID    valproate sodium  1,000 mg Intravenous Nightly    Or    divalproex ER  1,000 mg Oral Nightly    valproate sodium  500 mg Intravenous Daily before breakfast    Or    divalproex ER  500 mg Oral Daily before breakfast    enoxaparin (LOVENOX) injection  40 mg Subcutaneous Q24H SCH    folic acid  1 mg Oral Daily    haloperidol lactate  3 mg Intravenous BID    And    LORazepam  1 mg Intravenous BID    heparin, porcine (PF)        heparin, porcine (PF)        lamoTRIgine  200 mg Oral BID    melatonin  3 mg Oral QPM    multivitamins, therapeutic with minerals  1 tablet Oral Daily    polyethylene glycol  17 g Oral Daily    propranoloL  20 mg Oral BID    senna  2 tablet Oral Nightly    thiamine mononitrate (vit B1)  100 mg Oral Daily     Continuous infusions:    sodium chloride 200 mL/hr (04/05/22 1022)    sodium chloride Stopped (04/05/22 1741)         PRN medications: acetaminophen, haloperidol lactate **AND** diphenhydrAMINE, LORazepam, ondansetron    24 hour vital signs:  Temp:  [36.5 ??C (97.7 ??F)-37 ??C (98.6 ??F)] 37 ??C (98.6 ??F)  Heart Rate:  [69-95] 95  SpO2 Pulse:  [68-86] 86  Resp:  [12-19] 19  BP: (112-133)/(52-80) 117/65  MAP (mmHg):  [73-93] 81  SpO2:  [97 %-100 %] 100 %    Ins and Outs:  No intake/output data recorded.    Laboratory values:  All Labs Last 24hrs:   No results found for this or any previous visit (from the past 24 hour(s)).            Imaging:  Pertinent imaging discussed in the A/P section

## 2022-04-08 LAB — CBC
HEMATOCRIT: 34.8 % — ABNORMAL LOW (ref 39.0–48.0)
HEMOGLOBIN: 11.8 g/dL — ABNORMAL LOW (ref 12.9–16.5)
MEAN CORPUSCULAR HEMOGLOBIN CONC: 34 g/dL (ref 32.0–36.0)
MEAN CORPUSCULAR HEMOGLOBIN: 31.5 pg (ref 25.9–32.4)
MEAN CORPUSCULAR VOLUME: 92.6 fL (ref 77.6–95.7)
MEAN PLATELET VOLUME: 7.9 fL (ref 6.8–10.7)
PLATELET COUNT: 247 10*9/L (ref 150–450)
RED BLOOD CELL COUNT: 3.76 10*12/L — ABNORMAL LOW (ref 4.26–5.60)
RED CELL DISTRIBUTION WIDTH: 13.6 % (ref 12.2–15.2)
WBC ADJUSTED: 3.1 10*9/L — ABNORMAL LOW (ref 3.6–11.2)

## 2022-04-08 LAB — BASIC METABOLIC PANEL
ANION GAP: 5 mmol/L (ref 5–14)
BLOOD UREA NITROGEN: 9 mg/dL (ref 9–23)
BUN / CREAT RATIO: 14
CALCIUM: 9.4 mg/dL (ref 8.7–10.4)
CHLORIDE: 105 mmol/L (ref 98–107)
CO2: 28 mmol/L (ref 20.0–31.0)
CREATININE: 0.64 mg/dL
EGFR CKD-EPI (2021) MALE: >90 mL/min/{1.73_m2} (ref >=60)
GLUCOSE RANDOM: 86 mg/dL (ref 70–179)
POTASSIUM: 4.2 mmol/L (ref 3.4–4.8)
SODIUM: 138 mmol/L (ref 135–145)

## 2022-04-08 MED ADMIN — senna (SENOKOT) tablet 2 tablet: 2 | ORAL | @ 01:00:00 | Stop: 2022-04-11

## 2022-04-08 MED ADMIN — divalproex ER (DEPAKOTE ER) extended released 24 hr tablet 500 mg: 500 mg | ORAL | @ 13:00:00

## 2022-04-08 MED ADMIN — multivitamins, therapeutic with minerals tablet 1 tablet: 1 | ORAL | @ 13:00:00

## 2022-04-08 MED ADMIN — melatonin tablet 3 mg: 3 mg | ORAL | @ 22:00:00

## 2022-04-08 MED ADMIN — divalproex ER (DEPAKOTE ER) extended released 24 hr tablet 1,000 mg: 1000 mg | ORAL | @ 22:00:00

## 2022-04-08 MED ADMIN — propranoloL (INDERAL) tablet 20 mg: 20 mg | ORAL | @ 13:00:00

## 2022-04-08 MED ADMIN — ** PATIENT-SUPPLIED, IN PYXIS 1 ** cenobamate 400 mg tablet: 400 mg | ORAL | @ 01:00:00

## 2022-04-08 MED ADMIN — propranoloL (INDERAL) tablet 20 mg: 20 mg | ORAL | @ 01:00:00

## 2022-04-08 MED ADMIN — haloperidol lactate (HALDOL) injection 3 mg: 3 mg | INTRAVENOUS | @ 13:00:00 | Stop: 2022-04-08

## 2022-04-08 MED ADMIN — folic acid (FOLVITE) tablet 1 mg: 1 mg | ORAL | @ 13:00:00

## 2022-04-08 MED ADMIN — enoxaparin (LOVENOX) syringe 40 mg: 40 mg | SUBCUTANEOUS | @ 01:00:00

## 2022-04-08 MED ADMIN — thiamine mononitrate (vit B1) tablet 100 mg: 100 mg | ORAL | @ 13:00:00

## 2022-04-08 MED ADMIN — lamoTRIgine (LaMICtal) tablet 200 mg: 200 mg | ORAL | @ 22:00:00

## 2022-04-08 MED ADMIN — lamoTRIgine (LaMICtal) tablet 200 mg: 200 mg | ORAL | @ 13:00:00

## 2022-04-08 MED ADMIN — diphenhydrAMINE (BENADRYL) injection: 12.5 mg | INTRAVENOUS | @ 13:00:00 | Stop: 2022-04-08

## 2022-04-08 MED ADMIN — LORazepam (ATIVAN) injection 1 mg: 1 mg | INTRAVENOUS | @ 13:00:00 | Stop: 2022-04-08

## 2022-04-08 MED ADMIN — polyethylene glycol (MIRALAX) packet 17 g: 17 g | ORAL | @ 13:00:00

## 2022-04-08 NOTE — Unmapped (Signed)
Neurology Inpatient Team B (NMB)  Daily Progress Note       Patient: Austin Banks  Code Status: Full Code  Level of Care: Step-down status.   LOS: 26 days      Overnight Events & Subjective:     - No AOE, patient did not require any PRNs overnight  - Patient remains calm and cooperative without restraints. Expressed desire to discharge, though agrees to stay for further obs/safety assessment   - Ophelia Charter was discontinued late in day on 5/12; replaced by telesitter  - Per psych, family contacted who expressed they do not feel comfortable with pt discharging to home, plan to add pt to PAIR list for inpatient psych and pursue outpt placement including referral to [facility]   - Mild leukopenia at 3.1 today, will continue to trend. EKG unremarkable with normal QT interval      Physical Exam:     General Exam:   General Appearance: observed lying in bed. In NAD   Lungs: Normal work of breathing.    Extremities: No clubbing or cyanosis.     Neurological Exam:   Mental Status:   Alert, looking out window     Cranial Nerves:   Mild L facial droop at rest (baseline).   EOM grossly intact.   Hearing intact to conversation.  Nystagmus present on exam     Sensory exam:   Not done today. Previously light touch has been normal in the bilateral upper extremities and bilateral lower extremities.     Motor Exam:   Normal bulk   Moving all extremities spontaneously and equally against gravity     Reflexes:   Deferred. Previously 1+ throughout bilaterally, Hoffmann's absent bilaterally, Toes down going bilaterally    Cerebellar/Coordination/Gai:  Deferred. Previously observed taking a few steps around bed in hospital room, unassisted.        Assessment/Plan:     Assessment: Austin Banks is a 31 y.o. 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 He was found to have elevated GAD 65 antibody level compared to prior so he was treated for a flare however he has not had any seizures, (clinically or electrographically) which is the primary manifestation of his GAD 65 disease. His primary inpatient issue has been paranoid delusions with behavioral outbursts. He has now completed a series of plasma exchange pheresis for GAD 65 flare and started Cytoxan for maintenance therapy. His neurologic treatment is complete but he remains hospitalized due to ongoing paranoid delusions with poor insight, judgement and impulse control that make discharge to home unsafe. He has no acute medical needs for ongoing hospitalization but will require psychiatric placement. Per psych, family not comfortable with patient discharging to home as of 5/14.     # Dysarthria - AMS - Imbalance - Difficulty Ambulating: Patient presented with 2 weeks of worsening balance and fluctuating mental status in the setting of recent medication changes, frequent marijuana use and falls associated with seizure and subsequent head injury. His symptoms are likely medication and drug related given the multiple CNS sedating meds he requires for seizure control with concurrent drug use. Other than the dysarthria which has improved and the multi-directional nystagmus, there are no other focal neurological findings on exam. With his history of medically  refractory epilepsy and questionable compliance, it was important to rule out subclinical events contributing to his fluctuating mental status even though no clinical seizures had been observed prior to admission. During admission, there have been no clinical seizures and no seizures seen on EEG monitoring.     # GAD 65 c/b focal to bilateral tonic clonic seizures: Requires multiple medications for control, recently underwent evaluation for DBS vs VNS candidacy. Patient endorses compliance with ASMs, however, he spends half the week with friends per father, partying and smoking marijuana and so compliance is questionable. He has had subtherapeutic levels of Lamictal in the past. Valproic acid was obtained on admission but collected after he received 500mg  IV. Last Rituxan dose was 4/24. VPA level was 74. UTox positive for cannabinoids and benzos. cvEEG (03/14/22 0430 to 03/15/22 11:30 AM ): There were bilateral independent (L>>R) temporal epileptiform discharges indicating focal cortical irritation and increased potential for seizure activity over there regions. There was moderate encephalopathy which is non-specific in etiology. There are no seizures. EEG discontinued on 4/20; restarted on 5/2 x2 days with bitemporal sharp waves and R>L temporal slowing. These EEG findings do not warrant escalation of ASM's nor do they explain patient's ongoing psychiatric symptoms. Previously treated with Onfi, which was discontinued due to oversedation and Fycompa due to reports of potentially increased agitation with use (i4/20, dc 4/27). 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 No abnormal enhancement GAD 65 ab elevated to 163 on 4/19; was previously 40.4 in June 2021. Patient was treated for acute GAD 65 flare with PLEx x5 (5/2 - 5/9). Maintenance Cytoxan therapy was initiated on 5/11.      Plan:  - Continue home lamictal 200mg  BID  - Continue IV valproate to 500mg  qAM + 1000mg  nightly at 6pm for seizure and behavioral management   - Continue haldol 3mg  bid standing dose, benadryl 12.5 mg bid standing dose, lorazepam 1mg  bid standing dose   - Continue cenobamate 400mg  at bedtime   - Repeat GAD 65 Ab 5/19 (10 days after last PLEX session)  - Mnthly cycles of cyclophosphamide with MESNA for 3-6 months (see 5/9 progress note for references and Cytoxan/MESNA dosing details)  - After 3-6 months, likely plan to switch to Actemra     # Psychosis - Schizophrenia: IVC currently in place. Behavioral response called for increasing combativeness in attempt to elope requiring IM Ativan 2mg , followed by Haldol. Patient seems emotionally labile-appearing calm amenable to admission on initial evaluation but after his father left the bedside he became agitated, loud, refusing admission and encroaching on examiner's personal space. Patient has required multiple psychiatric admissions in the past most recently held in our Psych holding unit in 04/2021 for behavioral problems. Of note, patient has ongoing significant behavioral issues including paranoia and attempts to leave hospital, requiring 4 point restraints. There have been 3 episodes of violent outbursts against nursing staff during this hospitalization. His GAD-65 disease is not likely the primary etiology of his severe psychiatric symptoms, which continue to be the barrier to his discharge. Psychiatry is following.   -- Psychiatry consulted, updated recs below  - IV Depakote 500mg  qAM + 1000mg  nightly (at 6pm) as above   - Depakote levels:   - 4/25 subtherapeutic 38.6   - 4/28 therapeutic at 68.1   - 4/29 subtherapeutic at 48.7              - 5/2 therapeutic at 55.3              -  5/5 therapeutic at 60.6  - Changed from Haldol 3mg  BID IV to 5mg  BID PO   - Changed from Benadryl 12.5 mg BID IV to 25mg  BID PO  - Changed from lorazepam 1mg  BID IV to 1mg  BID PO   - Continue propranolol 20mg  BID in place of home propranolol XR 60mg  daily  - CONTINUE home lamictal 200 mg PO BID  - HOLD home trazodone 200 mg nightly  -- Agitation PRNs:   -- Continue Haldol 2mg  IM bid PRN   -- Continue Ativan 1mg  IM bid PRN   -- Continue Benadryl 12.5mg  IM bid PRN  -- continue thiamine PO/NGT supplementation daily   -- continue folate supplementation and multivitamin daily   -- continue telemetry monitoring    - EKG to monitor QTc (440 on 5/14)     # Polysubstance Use: Seen in [facility]. He has been referred to smoking cessation program. Patient endorses daily tobacco use and marijuana use (when visiting friends weekly). Denies recent cocaine use. Utox (+cannabinoids + benzos).  - Nicotine patch daily though patient refuses.      Discharge Planning:   - Case management: consulted. Recommendations appreciated.  - Social work: N/A  - PT: consulted, 5xL  - OT: consulted, 3x  - SLP: 3x  - Expected Discharge Disposition: TBD. Referral for [facility] placed. Expanded referrals statewide for inpatient psych admission.      Checklist:  - Diet: Regular diet  - IV fluids: none  - Bowel Regimen: senna  - GI PPX: No GI indications  - DVT PPX: Lovenox 40 mg [place] daily  - Lines/Access:   Patient Lines/Drains/Airways Status       Active Active Lines, Drains, & Airways       Name Placement date Placement time Site Days    Peripheral IV 03/31/22 Anterior;Left Forearm 03/31/22  1800  Forearm  7                  - Foley: No     Patient seen and discussed with attending physician, Dr. Dan Humphreys who is in agreement with the assessment and plan.      Neurology Team B resident can be reached at [phone].    Aloha Gell, MD   Psychiatry, PGY-1     Data Review:     Contact Information:  Family contact: Courtez, Twaddle (Father)   [phone] (Mobile)    PCP: [facility]    Medications:  Scheduled medications:    cenobamate  400 mg Oral At bedtime    calcium chloride        calcium chloride        diphenhydrAMINE  12.5 mg Intravenous BID    valproate sodium  1,000 mg Intravenous Nightly    Or    divalproex ER  1,000 mg Oral Nightly    valproate sodium  500 mg Intravenous Daily before breakfast    Or    divalproex ER  500 mg Oral Daily before breakfast    enoxaparin (LOVENOX) injection  40 mg Subcutaneous Q24H SCH    folic acid  1 mg Oral Daily    haloperidol lactate  3 mg Intravenous BID    And    LORazepam  1 mg Intravenous BID    heparin, porcine (PF)        heparin, porcine (PF)        lamoTRIgine  200 mg Oral BID    melatonin  3 mg Oral QPM  multivitamins, therapeutic with minerals  1 tablet Oral Daily    polyethylene glycol  17 g Oral Daily    propranoloL  20 mg Oral BID    senna  2 tablet Oral Nightly    thiamine mononitrate (vit B1)  100 mg Oral Daily     Continuous infusions:           PRN medications: acetaminophen, haloperidol lactate **AND** diphenhydrAMINE, LORazepam, ondansetron    24 hour vital signs:  Temp:  [36.5 ??C (97.7 ??F)-36.8 ??C (98.2 ??F)] 36.6 ??C (97.9 ??F)  Heart Rate:  [66-90] 80  SpO2 Pulse:  [71-83] 80  Resp:  [15-21] 15  BP: (105-140)/(37-80) 140/80  MAP (mmHg):  [59-98] 98  SpO2:  [98 %-100 %] 100 %    Ins and Outs:  I/O this shift:  In: 130 [P.O.:120; I.V.:10]  Out: 350 [Urine:350]    Laboratory values:  All Labs Last 24hrs:   Recent Results (from the past 24 hour(s))   CBC    Collection Time: 04/08/22  4:57 AM   Result Value Ref Range    WBC 3.1 (L) 3.6 - 11.2 10*9/L    RBC 3.76 (L) 4.26 - 5.60 10*12/L    HGB 11.8 (L) 12.9 - 16.5 g/dL    HCT 16.1 (L) 09.6 - 48.0 %    MCV 92.6 77.6 - 95.7 fL    MCH 31.5 25.9 - 32.4 pg    MCHC 34.0 32.0 - 36.0 g/dL    RDW 04.5 40.9 - 81.1 %    MPV 7.9 6.8 - 10.7 fL    Platelet 247 150 - 450 10*9/L   Basic Metabolic Panel    Collection Time: 04/08/22  4:57 AM   Result Value Ref Range    Sodium 138 135 - 145 mmol/L    Potassium 4.2 3.4 - 4.8 mmol/L    Chloride 105 98 - 107 mmol/L    CO2 28.0 20.0 - 31.0 mmol/L    Anion Gap 5 5 - 14 mmol/L    BUN 9 9 - 23 mg/dL    Creatinine 9.14 7.82 - 1.10 mg/dL    BUN/Creatinine Ratio 14     eGFR CKD-EPI (2021) Male >90 >=60 mL/min/1.55m2    Glucose 86 70 - 179 mg/dL    Calcium 9.4 8.7 - 95.6 mg/dL   ECG 12 Lead    Collection Time: 04/08/22  8:47 AM   Result Value Ref Range    EKG Systolic BP  mmHg    EKG Diastolic BP  mmHg    EKG Ventricular Rate 94 BPM    EKG Atrial Rate 94 BPM    EKG P-R Interval 136 ms    EKG QRS Duration 82 ms    EKG Q-T Interval 352 ms    EKG QTC Calculation 440 ms    EKG Calculated P Axis 66 degrees    EKG Calculated R Axis 45 degrees    EKG Calculated T Axis 32 degrees    QTC Fredericia 408 ms     Imaging:  Pertinent imaging discussed in the A/P section
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
## 2022-04-08 NOTE — Unmapped (Signed)
Shift summary: Pt appropriate this shift, remains out of restraints.  PNA at bedside for elopement with no attempts to leave.  No s/s of seizures.  Stepdown status maintained.    Problem: Adult Inpatient Plan of Care  Goal: Plan of Care Review  Outcome: Progressing  Goal: Patient-Specific Goal (Individualized)  Outcome: Progressing  Goal: Absence of Hospital-Acquired Illness or Injury  Outcome: Progressing  Intervention: Identify and Manage Fall Risk  Recent Flowsheet Documentation  Taken 04/08/2022 0000 by Brayton Mars, RN  Safety Interventions: elopement precautions  Taken 04/07/2022 2200 by Brayton Mars, RN  Safety Interventions: elopement precautions  Taken 04/07/2022 2000 by Brayton Mars, RN  Safety Interventions:   elopement precautions   low bed   seizure precautions  Intervention: Prevent and Manage VTE (Venous Thromboembolism) Risk  Recent Flowsheet Documentation  Taken 04/07/2022 2000 by Brayton Mars, RN  Activity Management: activity adjusted per tolerance  Intervention: Prevent Infection  Recent Flowsheet Documentation  Taken 04/07/2022 2000 by Brayton Mars, RN  Infection Prevention: environmental surveillance performed  Goal: Optimal Comfort and Wellbeing  Outcome: Progressing  Goal: Readiness for Transition of Care  Outcome: Progressing  Goal: Rounds/Family Conference  Outcome: Progressing     Problem: Self-Care Deficit  Goal: Improved Ability to Complete Activities of Daily Living  Outcome: Progressing     Problem: Seizure Disorder Comorbidity  Goal: Maintenance of Seizure Control  Outcome: Progressing  Intervention: Maintain Seizure-Symptom Control  Recent Flowsheet Documentation  Taken 04/07/2022 2000 by Brayton Mars, RN  Seizure Precautions:   clutter-free environment maintained   activity supervised     Problem: VTE (Venous Thromboembolism)  Goal: VTE (Venous Thromboembolism) Symptom Resolution  Outcome: Progressing  Intervention: Prevent or Manage VTE (Venous Thromboembolism)  Recent Flowsheet Documentation  Taken 04/08/2022 0200 by Brayton Mars, RN  Anti-Embolism Intervention: Refused  Taken 04/08/2022 0000 by Brayton Mars, RN  Anti-Embolism Intervention: Refused  Taken 04/07/2022 2200 by Brayton Mars, RN  Anti-Embolism Intervention: Refused  Taken 04/07/2022 2000 by Brayton Mars, RN  Bleeding Precautions: blood pressure closely monitored  Anti-Embolism Intervention: Refused

## 2022-04-09 MED ADMIN — divalproex ER (DEPAKOTE ER) extended released 24 hr tablet 500 mg: 500 mg | ORAL | @ 12:00:00

## 2022-04-09 MED ADMIN — propranoloL (INDERAL) tablet 20 mg: 20 mg | ORAL | @ 12:00:00

## 2022-04-09 MED ADMIN — haloperidoL (HALDOL) tablet 5 mg: 5 mg | ORAL | @ 12:00:00

## 2022-04-09 MED ADMIN — melatonin tablet 3 mg: 3 mg | ORAL | @ 22:00:00

## 2022-04-09 MED ADMIN — lamoTRIgine (LaMICtal) tablet 200 mg: 200 mg | ORAL | @ 22:00:00

## 2022-04-09 MED ADMIN — thiamine mononitrate (vit B1) tablet 100 mg: 100 mg | ORAL | @ 12:00:00

## 2022-04-09 MED ADMIN — enoxaparin (LOVENOX) syringe 40 mg: 40 mg | SUBCUTANEOUS | @ 02:00:00

## 2022-04-09 MED ADMIN — LORazepam (ATIVAN) injection 1 mg: 1 mg | INTRAMUSCULAR | @ 18:00:00

## 2022-04-09 MED ADMIN — divalproex ER (DEPAKOTE ER) extended released 24 hr tablet 1,000 mg: 1000 mg | ORAL | @ 22:00:00

## 2022-04-09 MED ADMIN — haloperidoL (HALDOL) tablet 5 mg: 5 mg | ORAL | @ 02:00:00

## 2022-04-09 MED ADMIN — ** PATIENT-SUPPLIED, IN PYXIS 1 ** cenobamate 400 mg tablet: 400 mg | ORAL | @ 02:00:00

## 2022-04-09 MED ADMIN — multivitamins, therapeutic with minerals tablet 1 tablet: 1 | ORAL | @ 12:00:00

## 2022-04-09 MED ADMIN — folic acid (FOLVITE) tablet 1 mg: 1 mg | ORAL | @ 12:00:00

## 2022-04-09 MED ADMIN — LORazepam (ATIVAN) tablet 1 mg: 1 mg | ORAL | @ 02:00:00

## 2022-04-09 MED ADMIN — LORazepam (ATIVAN) tablet 1 mg: 1 mg | ORAL | @ 12:00:00

## 2022-04-09 MED ADMIN — diphenhydrAMINE (BENADRYL) capsule/tablet 25 mg: 25 mg | ORAL | @ 02:00:00

## 2022-04-09 MED ADMIN — lamoTRIgine (LaMICtal) tablet 200 mg: 200 mg | ORAL | @ 12:00:00

## 2022-04-09 MED ADMIN — diphenhydrAMINE (BENADRYL) capsule/tablet 25 mg: 25 mg | ORAL | @ 12:00:00
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
## 2022-04-09 NOTE — Unmapped (Signed)
Shift summary: Pt remains pleasant this shift w/no need for sitter or prn meds.  Telesitter in use.  Pt expressed some concern that EKG leads were shocking him.  Educated pt and pt agreed to still wear cardiac monitor.  Pt could benefit from a sleep holiday, including holding BP checks at midnight.     Problem: Adult Inpatient Plan of Care  Goal: Plan of Care Review  Outcome: Progressing  Goal: Patient-Specific Goal (Individualized)  Outcome: Progressing  Goal: Absence of Hospital-Acquired Illness or Injury  Outcome: Progressing  Intervention: Identify and Manage Fall Risk  Recent Flowsheet Documentation  Taken 04/09/2022 0400 by Brayton Mars, RN  Safety Interventions: low bed  Taken 04/09/2022 0000 by Brayton Mars, RN  Safety Interventions: low bed  Taken 04/08/2022 2200 by Brayton Mars, RN  Safety Interventions: low bed  Taken 04/08/2022 2000 by Brayton Mars, RN  Safety Interventions: elopement precautions  Intervention: Prevent and Manage VTE (Venous Thromboembolism) Risk  Recent Flowsheet Documentation  Taken 04/08/2022 2000 by Brayton Mars, RN  Activity Management: activity adjusted per tolerance  Intervention: Prevent Infection  Recent Flowsheet Documentation  Taken 04/08/2022 2000 by Brayton Mars, RN  Infection Prevention: hand hygiene promoted  Goal: Optimal Comfort and Wellbeing  Outcome: Progressing  Goal: Readiness for Transition of Care  Outcome: Progressing  Goal: Rounds/Family Conference  Outcome: Progressing     Problem: Seizure Disorder Comorbidity  Goal: Maintenance of Seizure Control  Outcome: Progressing  Intervention: Maintain Seizure-Symptom Control  Recent Flowsheet Documentation  Taken 04/08/2022 2000 by Brayton Mars, RN  Seizure Precautions: clutter-free environment maintained     Problem: Seizure, Active Management  Goal: Absence of Seizure/Seizure-Related Injury  Outcome: Progressing  Intervention: Prevent Seizure-Related Injury  Recent Flowsheet Documentation  Taken 04/08/2022 2000 by Brayton Mars, RN  Seizure Precautions: clutter-free environment maintained     Problem: Fall Injury Risk  Goal: Absence of Fall and Fall-Related Injury  Outcome: Progressing  Intervention: Promote Injury-Free Environment  Recent Flowsheet Documentation  Taken 04/09/2022 0400 by Brayton Mars, RN  Safety Interventions: low bed  Taken 04/09/2022 0000 by Brayton Mars, RN  Safety Interventions: low bed  Taken 04/08/2022 2200 by Brayton Mars, RN  Safety Interventions: low bed  Taken 04/08/2022 2000 by Brayton Mars, RN  Safety Interventions: elopement precautions

## 2022-04-09 NOTE — Unmapped (Signed)
Pt calm and pleasant throughout shift, ambulated in hallx3 with NA, telesitter at bedside for elopement, remains intermediate status.    Problem: Adult Inpatient Plan of Care  Goal: Plan of Care Review  Outcome: Progressing  Goal: Patient-Specific Goal (Individualized)  Outcome: Progressing  Goal: Absence of Hospital-Acquired Illness or Injury  Outcome: Progressing  Intervention: Prevent Skin Injury  Recent Flowsheet Documentation  Taken 04/08/2022 0800 by Laurence Compton, RN  Skin Protection:   adhesive use limited   cleansing with dimethicone incontinence wipes   incontinence pads utilized   tubing/devices free from skin contact  Intervention: Prevent and Manage VTE (Venous Thromboembolism) Risk  Recent Flowsheet Documentation  Taken 04/08/2022 0800 by Laurence Compton, RN  Activity Management: activity adjusted per tolerance  VTE Prevention/Management:   ambulation promoted   dorsiflexion/plantar flexion performed   fluids promoted  Intervention: Prevent Infection  Recent Flowsheet Documentation  Taken 04/08/2022 0800 by Laurence Compton, RN  Infection Prevention:   hand hygiene promoted   personal protective equipment utilized  Goal: Optimal Comfort and Wellbeing  Outcome: Progressing  Goal: Readiness for Transition of Care  Outcome: Progressing  Goal: Rounds/Family Conference  Outcome: Progressing     Problem: Seizure Disorder Comorbidity  Goal: Maintenance of Seizure Control  Outcome: Progressing  Intervention: Maintain Seizure-Symptom Control  Recent Flowsheet Documentation  Taken 04/08/2022 0800 by Laurence Compton, RN  Seizure Precautions: clutter-free environment maintained     Problem: Self-Care Deficit  Goal: Improved Ability to Complete Activities of Daily Living  Outcome: Progressing     Problem: Violence Risk or Actual  Goal: Anger and Impulse Control  Outcome: Progressing     Problem: VTE (Venous Thromboembolism)  Goal: VTE (Venous Thromboembolism) Symptom Resolution  Outcome: Progressing  Intervention: Prevent or Manage VTE (Venous Thromboembolism)  Recent Flowsheet Documentation  Taken 04/08/2022 1800 by Laurence Compton, RN  Anti-Embolism Intervention: (education provided) Refused  Taken 04/08/2022 1600 by Laurence Compton, RN  Anti-Embolism Intervention: (education provided) Refused  Taken 04/08/2022 1400 by Laurence Compton, RN  Anti-Embolism Intervention: (education provided) Refused  Taken 04/08/2022 1200 by Laurence Compton, RN  Anti-Embolism Intervention: (education provided) Refused  Taken 04/08/2022 1000 by Laurence Compton, RN  Anti-Embolism Intervention: (education provided) Refused  Taken 04/08/2022 0800 by Laurence Compton, RN  Bleeding Precautions:   blood pressure closely monitored   gentle oral care promoted  VTE Prevention/Management:   ambulation promoted   dorsiflexion/plantar flexion performed   fluids promoted  Anti-Embolism Intervention: (education provided) Refused     Problem: Seizure, Active Management  Goal: Absence of Seizure/Seizure-Related Injury  Outcome: Progressing  Intervention: Prevent Seizure-Related Injury  Recent Flowsheet Documentation  Taken 04/08/2022 0800 by Laurence Compton, RN  Seizure Precautions: clutter-free environment maintained     Problem: Adjustment to Illness (Delirium)  Goal: Optimal Coping  Outcome: Progressing     Problem: Altered Behavior (Delirium)  Goal: Improved Behavioral Control  Outcome: Progressing     Problem: Attention and Thought Clarity Impairment (Delirium)  Goal: Improved Attention and Thought Clarity  Outcome: Progressing     Problem: Sleep Disturbance (Delirium)  Goal: Improved Sleep  Outcome: Progressing     Problem: Fall Injury Risk  Goal: Absence of Fall and Fall-Related Injury  Outcome: Progressing     Problem: Skin Injury Risk Increased  Goal: Skin Health and Integrity  Outcome: Progressing  Intervention: Optimize Skin Protection  Recent Flowsheet Documentation  Taken 04/08/2022 0800 by Laurence Compton, RN  Pressure Reduction  Techniques:   frequent weight shift encouraged   heels elevated off bed  Head of Bed (HOB) Positioning: HOB at 30-45 degrees  Pressure Reduction Devices:   pressure-redistributing mattress utilized   positioning supports utilized  Skin Protection:   adhesive use limited   cleansing with dimethicone incontinence wipes   incontinence pads utilized   tubing/devices free from skin contact

## 2022-04-09 NOTE — Unmapped (Signed)
Neurology Inpatient Team B (NMB)  Daily Progress Note       Patient: Austin Banks  Code Status: Full Code  Level of Care: Step-down status.   LOS: 27 days      Overnight Events & Subjective:     - No AOE, patient did not require any PRNs overnight  - Patient remains calm and cooperative without restraints. Informed pt for need of further hospitalization for safe discharge planning   - No new recs per psych, family still wanting outpt placement       Physical Exam:     General Exam:   General Appearance: observed lying in bed. In NAD   Lungs: Normal work of breathing.    Extremities: No clubbing or cyanosis.     Neurological Exam:   Mental Status:   Alert, looking out window     Cranial Nerves:   Mild L facial droop at rest (baseline).   EOM grossly intact.   Hearing intact to conversation.  Nystagmus present on exam     Sensory exam:   Not done today. Previously light touch has been normal in the bilateral upper extremities and bilateral lower extremities.     Motor Exam:   Normal bulk   Moving all extremities spontaneously and equally against gravity     Reflexes:   Deferred. Previously 1+ throughout bilaterally, Hoffmann's absent bilaterally, Toes down going bilaterally    Cerebellar/Coordination/Gai:  Deferred. Previously observed taking a few steps around bed in hospital room, unassisted.        Assessment/Plan:     Assessment: Austin Banks is a 31 y.o. 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 He was found to have elevated GAD 65 antibody level compared to prior so he was treated for a flare however he has not had any seizures, (clinically or electrographically) which is the primary manifestation of his GAD 65 disease. His primary inpatient issue has been paranoid delusions with behavioral outbursts. He has now completed a series of plasma exchange pheresis for GAD 65 flare and started Cytoxan for maintenance therapy. His neurologic treatment is complete but he remains hospitalized due to ongoing paranoid delusions with poor insight, judgement and impulse control that make discharge to home unsafe. He has no acute medical needs for ongoing hospitalization but will require psychiatric placement. Per psych, family not comfortable with patient discharging to home as of 5/14.     # Dysarthria - AMS - Imbalance - Difficulty Ambulating: Patient presented with 2 weeks of worsening balance and fluctuating mental status in the setting of recent medication changes, frequent marijuana use and falls associated with seizure and subsequent head injury. His symptoms are likely medication and drug related given the multiple CNS sedating meds he requires for seizure control with concurrent drug use. Other than the dysarthria which has improved and the multi-directional nystagmus, there are no other focal neurological findings on exam. With his history of medically refractory epilepsy and questionable compliance, it was important to rule out subclinical events contributing to his fluctuating mental status even though no clinical seizures had been observed prior to admission. During admission, there have been no clinical seizures and no seizures seen on EEG monitoring.     # GAD 65 c/b focal to  bilateral tonic clonic seizures: Requires multiple medications for control, recently underwent evaluation for DBS vs VNS candidacy. Patient endorses compliance with ASMs, however, he spends half the week with friends per father, partying and smoking marijuana and so compliance is questionable. He has had subtherapeutic levels of Lamictal in the past. Valproic acid was obtained on admission but collected after he received 500mg  IV. Last Rituxan dose was 4/24. VPA level was 74. UTox positive for cannabinoids and benzos. cvEEG (03/14/22 0430 to 03/15/22 11:30 AM ): There were bilateral independent (L>>R) temporal epileptiform discharges indicating focal cortical irritation and increased potential for seizure activity over there regions. There was moderate encephalopathy which is non-specific in etiology. There are no seizures. EEG discontinued on 4/20; restarted on 5/2 x2 days with bitemporal sharp waves and R>L temporal slowing. These EEG findings do not warrant escalation of ASM's nor do they explain patient's ongoing psychiatric symptoms. Previously treated with Onfi, which was discontinued due to oversedation and Fycompa due to reports of potentially increased agitation with use (i4/20, dc 4/27). 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 No abnormal enhancement GAD 65 ab elevated to 163 on 4/19; was previously 40.4 in June 2021. Patient was treated for acute GAD 65 flare with PLEx x5 (5/2 - 5/9). Maintenance Cytoxan therapy was initiated on 5/11.      Plan:  - Continue home lamictal 200mg  BID  - Continue IV valproate to 500mg  qAM + 1000mg  nightly at 6pm for seizure and behavioral management   - Continue haldol 3mg  bid standing dose, benadryl 12.5 mg bid standing dose, lorazepam 1mg  bid standing dose   - Continue cenobamate 400mg  at bedtime   - Repeat GAD 65 Ab 5/19 (10 days after last PLEX session)  - Mnthly cycles of cyclophosphamide with MESNA for 3-6 months (see 5/9 progress note for references and Cytoxan/MESNA dosing details)  - After 3-6 months, likely plan to switch to Actemra     # Psychosis - Schizophrenia: IVC currently in place. Behavioral response called for increasing combativeness in attempt to elope requiring IM Ativan 2mg , followed by Haldol. Patient seems emotionally labile-appearing calm amenable to admission on initial evaluation but after his father left the bedside he became agitated, loud, refusing admission and encroaching on examiner's personal space. Patient has required multiple psychiatric admissions in the past most recently held in our Psych holding unit in 04/2021 for behavioral problems. Of note, patient has ongoing significant behavioral issues including paranoia and attempts to leave hospital, requiring 4 point restraints. There have been 3 episodes of violent outbursts against nursing staff during this hospitalization. His GAD-65 disease is not likely the primary etiology of his severe psychiatric symptoms, which continue to be the barrier to his discharge. Psychiatry is following.   -- Psychiatry consulted, updated recs below  - IV Depakote 500mg  qAM + 1000mg  nightly (at 6pm) as above   - Depakote levels:   - 4/25 subtherapeutic 38.6   - 4/28 therapeutic at 68.1   - 4/29 subtherapeutic at 48.7              - 5/2 therapeutic at 55.3              - 5/5 therapeutic at 60.6  - Continue Haldol 5mg  BID PO   - Continue Benadryl 25mg  BID PO  - Continue lorazepam 1mg  BID PO   - Continue propranolol 20mg  BID in place of home propranolol XR 60mg  daily  - CONTINUE  home lamictal 200 mg PO BID  - HOLD home trazodone 200 mg nightly  -- Agitation PRNs:   -- Continue Haldol 2mg  IM bid PRN   -- Continue Ativan 1mg  IM bid PRN   -- Continue Benadryl 12.5mg  IM bid PRN  -- continue thiamine PO/NGT supplementation daily   -- continue folate supplementation and multivitamin daily   -- continue telemetry monitoring    - EKG to monitor QTc (440 on 5/14)     # Polysubstance Use: Seen in [facility]. He has been referred to smoking cessation program. Patient endorses daily tobacco use and marijuana use (when visiting friends weekly). Denies recent cocaine use. Utox (+cannabinoids + benzos).  - Nicotine patch daily though patient refuses.      Discharge Planning:   - Case management: consulted. Recommendations appreciated.  - Social work: N/A  - PT: consulted, 5xL  - OT: consulted, 3x  - SLP: 3x  - Expected Discharge Disposition: TBD. Referral for [facility] placed. Expanded referrals statewide for inpatient psych admission.      Checklist:  - Diet: Regular diet  - IV fluids: none  - Bowel Regimen: senna  - GI PPX: No GI indications  - DVT PPX: Lovenox 40 mg [place] daily  - Lines/Access:   Patient Lines/Drains/Airways Status       Active Active Lines, Drains, & Airways       Name Placement date Placement time Site Days    Peripheral IV 03/31/22 Anterior;Left Forearm 03/31/22  1800  Forearm  8                  - Foley: No     Patient seen and discussed with attending physician, Dr. Nadara Mode who is in agreement with the assessment and plan.      Neurology Team B resident can be reached at [phone].    Aloha Gell, MD   Psychiatry, PGY-1     Data Review:     Contact Information:  Family contact: Jaryn, Rosko (Father)   [phone] (Mobile)    PCP: [facility]    Medications:  Scheduled medications:    cenobamate  400 mg Oral At bedtime    calcium chloride        calcium chloride        diphenhydrAMINE  25 mg Oral BID    valproate sodium  1,000 mg Intravenous Nightly    Or    divalproex ER  1,000 mg Oral Nightly    valproate sodium  500 mg Intravenous Daily before breakfast    Or    divalproex ER  500 mg Oral Daily before breakfast    enoxaparin (LOVENOX) injection  40 mg Subcutaneous Q24H SCH    folic acid  1 mg Oral Daily    haloperidoL  5 mg Oral BID    heparin, porcine (PF)        heparin, porcine (PF)        lamoTRIgine  200 mg Oral BID    LORazepam  1 mg Oral BID    melatonin  3 mg Oral QPM    multivitamins, therapeutic with minerals  1 tablet Oral Daily    polyethylene glycol  17 g Oral Daily    propranoloL  20 mg Oral BID    senna  2 tablet Oral Nightly    thiamine mononitrate (vit B1)  100 mg Oral Daily     Continuous infusions:       PRN medications: acetaminophen, haloperidol  lactate **AND** diphenhydrAMINE, LORazepam, ondansetron    24 hour vital signs:  Temp:  [36.2 ??C (97.2 ??F)-36.8 ??C (98.3 ??F)] 36.7 ??C (98.1 ??F)  Heart Rate:  [63-88] 71  SpO2 Pulse:  [65-80] 71  Resp:  [17-24] 19  BP: (98-133)/(44-70) 118/70  MAP (mmHg):  [60-89] 81  SpO2:  [97 %-100 %] 100 %    Ins and Outs:  I/O this shift:  In: 940 [P.O.:940]  Out: 400 [Urine:400]    Laboratory values:  All Labs Last 24hrs:   No results found for this or any previous visit (from the past 24 hour(s)).    Imaging:  Pertinent imaging discussed in the A/P section

## 2022-04-10 MED ADMIN — melatonin tablet 3 mg: 3 mg | ORAL | @ 22:00:00

## 2022-04-10 MED ADMIN — divalproex ER (DEPAKOTE ER) extended released 24 hr tablet 1,000 mg: 1000 mg | ORAL | @ 22:00:00

## 2022-04-10 MED ADMIN — propranoloL (INDERAL) tablet 20 mg: 20 mg | ORAL | @ 13:00:00

## 2022-04-10 MED ADMIN — ** PATIENT-SUPPLIED, IN PYXIS 1 ** cenobamate 400 mg tablet: 400 mg | ORAL | @ 01:00:00

## 2022-04-10 MED ADMIN — enoxaparin (LOVENOX) syringe 40 mg: 40 mg | SUBCUTANEOUS | @ 01:00:00

## 2022-04-10 MED ADMIN — LORazepam (ATIVAN) tablet 1 mg: 1 mg | ORAL | @ 13:00:00

## 2022-04-10 MED ADMIN — LORazepam (ATIVAN) tablet 1 mg: 1 mg | ORAL | @ 01:00:00

## 2022-04-10 MED ADMIN — propranoloL (INDERAL) tablet 20 mg: 20 mg | ORAL | @ 01:00:00

## 2022-04-10 MED ADMIN — multivitamins, therapeutic with minerals tablet 1 tablet: 1 | ORAL | @ 13:00:00

## 2022-04-10 MED ADMIN — diphenhydrAMINE (BENADRYL) capsule/tablet 25 mg: 25 mg | ORAL | @ 01:00:00

## 2022-04-10 MED ADMIN — lamoTRIgine (LaMICtal) tablet 200 mg: 200 mg | ORAL | @ 13:00:00

## 2022-04-10 MED ADMIN — haloperidoL (HALDOL) tablet 5 mg: 5 mg | ORAL | @ 01:00:00

## 2022-04-10 MED ADMIN — divalproex ER (DEPAKOTE ER) extended released 24 hr tablet 500 mg: 500 mg | ORAL | @ 13:00:00

## 2022-04-10 MED ADMIN — diphenhydrAMINE (BENADRYL) capsule/tablet 25 mg: 25 mg | ORAL | @ 13:00:00

## 2022-04-10 MED ADMIN — thiamine mononitrate (vit B1) tablet 100 mg: 100 mg | ORAL | @ 13:00:00

## 2022-04-10 MED ADMIN — folic acid (FOLVITE) tablet 1 mg: 1 mg | ORAL | @ 13:00:00

## 2022-04-10 MED ADMIN — haloperidoL (HALDOL) tablet 5 mg: 5 mg | ORAL | @ 13:00:00

## 2022-04-10 MED ADMIN — lamoTRIgine (LaMICtal) tablet 200 mg: 200 mg | ORAL | @ 22:00:00

## 2022-04-10 NOTE — Unmapped (Addendum)
Patient alert and oriented x4. Intermittently confused and hallucinating. Afebrile. NSR. Normotensive. Pt on room air. No complaints of pain. Pt voding; adequate UOP. No BM. Pt turns self in bed; no new skin breakdown noted. Will continue to monitor patient's vital signs, pain level, I+Os.    Problem: Adult Inpatient Plan of Care  Goal: Plan of Care Review  Outcome: Ongoing - Unchanged  Flowsheets (Taken 04/10/2022 0554)  Progress: no change  Plan of Care Reviewed With: patient  Goal: Patient-Specific Goal (Individualized)  Outcome: Ongoing - Unchanged  Flowsheets (Taken 04/10/2022 0554)  Patient-Specific Goals (Include Timeframe): During hospitalization, pt's pain level will be less than 6 out of 10  Individualized Care Needs: assessment, reposition, pain meds  Goal: Absence of Hospital-Acquired Illness or Injury  Outcome: Ongoing - Unchanged  Intervention: Identify and Manage Fall Risk  Recent Flowsheet Documentation  Taken 04/09/2022 2000 by Alexander Mt, RN  Safety Interventions:   aspiration precautions   bed alarm   fall reduction program maintained   lighting adjusted for tasks/safety   low bed   nonskid shoes/slippers when out of bed  Intervention: Prevent and Manage VTE (Venous Thromboembolism) Risk  Recent Flowsheet Documentation  Taken 04/10/2022 0400 by Alexander Mt, RN  Activity Management: activity adjusted per tolerance  Taken 04/10/2022 0200 by Alexander Mt, RN  Activity Management: activity adjusted per tolerance  Taken 04/10/2022 0000 by Alexander Mt, RN  Activity Management: activity adjusted per tolerance  Taken 04/09/2022 2200 by Alexander Mt, RN  Activity Management: activity adjusted per tolerance  Taken 04/09/2022 2000 by Alexander Mt, RN  Activity Management: activity adjusted per tolerance  Intervention: Prevent Infection  Recent Flowsheet Documentation  Taken 04/09/2022 2000 by Alexander Mt, RN  Infection Prevention: hand hygiene promoted  Goal: Optimal Comfort and Wellbeing  Outcome: Ongoing - Unchanged  Goal: Readiness for Transition of Care  Outcome: Ongoing - Unchanged  Goal: Rounds/Family Conference  Outcome: Ongoing - Unchanged

## 2022-04-10 NOTE — Unmapped (Signed)
Neurology Inpatient Team B (NMB)  Daily Progress Note       Patient: Austin Banks  Code Status: Full Code  Level of Care: Step-down status.   LOS: 28 days      Overnight Events & Subjective:     - No AOE, though patient required 1mg  ativan yesterday for agitation after hearing he was unable to go home  - Patient remains calm and cooperative today without restraints   - No new recs per psych, per CM patient may discharge to home tomorrow       Physical Exam:     General Exam:   General Appearance: observed lying in bed. In NAD   Lungs: Normal work of breathing.    Extremities: No clubbing or cyanosis.     Neurological Exam:   Mental Status:   Alert, looking out window     Cranial Nerves:   Mild L facial droop at rest (baseline).   EOM grossly intact.   Hearing intact to conversation.  Nystagmus present on exam     Sensory exam:   Not done today. Previously light touch has been normal in the bilateral upper extremities and bilateral lower extremities.     Motor Exam:   Normal bulk   Moving all extremities spontaneously and equally against gravity     Reflexes:   Deferred. Previously 1+ throughout bilaterally, Hoffmann's absent bilaterally, Toes down going bilaterally    Cerebellar/Coordination/Gai:  Deferred. Previously observed taking a few steps around bed in hospital room, unassisted.        Assessment/Plan:     Assessment: Austin Banks is a 31 y.o. male with a past medical history of GAD 33 associated focal onset epilepsy, previously treated with Rituxan and monthly IVIG, polysubstance use (currently marijuana, tobacco, remote cocaine use), psychosis, schizophrenia, depression, and anxiety, who was admitted to [facility] on 03/13/22 for worsening balance, dysarthria, cognition, weight loss and poor appetite. He was found to have elevated GAD 65 antibody level compared to prior so he was treated for a flare however he has not had any seizures, (clinically or electrographically) which is the primary manifestation of his GAD 65 disease. His primary inpatient issue has been paranoid delusions with behavioral outbursts. He has now completed a series of plasma exchange pheresis for GAD 65 flare and started Cytoxan for maintenance therapy. His neurologic treatment is complete but he remains hospitalized due to ongoing paranoid delusions with poor insight, judgement and impulse control that make discharge to home unsafe. He has no acute medical needs for ongoing hospitalization but will require psychiatric placement vs discharge to home.     # Dysarthria - AMS - Imbalance - Difficulty Ambulating: Patient presented with 2 weeks of worsening balance and fluctuating mental status in the setting of recent medication changes, frequent marijuana use and falls associated with seizure and subsequent head injury. His symptoms are likely medication and drug related given the multiple CNS sedating meds he requires for seizure control with concurrent drug use. Other than the dysarthria which has improved and the multi-directional nystagmus, there are no other focal neurological findings on exam. With his history of medically refractory epilepsy and questionable compliance, it was important to rule out subclinical events contributing to his fluctuating mental status even though no clinical seizures had been observed prior to admission. During admission, there have been no clinical seizures and no seizures seen on EEG monitoring.     # GAD 65 c/b focal to bilateral tonic clonic seizures: Requires multiple medications  for control, recently underwent evaluation for DBS vs VNS candidacy. Patient endorses compliance with ASMs, however, he spends half the week with friends per father, partying and smoking marijuana and so compliance is questionable. He has had subtherapeutic levels of Lamictal in the past. Valproic acid was obtained on admission but collected after he received 500mg  IV. Last Rituxan dose was 4/24. VPA level was 74. UTox positive for cannabinoids and benzos. cvEEG (03/14/22 0430 to 03/15/22 11:30 AM ): There were bilateral independent (L>>R) temporal epileptiform discharges indicating focal cortical irritation and increased potential for seizure activity over there regions. There was moderate encephalopathy which is non-specific in etiology. There are no seizures. EEG discontinued on 4/20; restarted on 5/2 x2 days with bitemporal sharp waves and R>L temporal slowing. These EEG findings do not warrant escalation of ASM's nor do they explain patient's ongoing psychiatric symptoms. Previously treated with Onfi, which was discontinued due to oversedation and Fycompa due to reports of potentially increased agitation with use (i4/20, dc 4/27). 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 No abnormal enhancement GAD 65 ab elevated to 163 on 4/19; was previously 40.4 in June 2021. Patient was treated for acute GAD 65 flare with PLEx x5 (5/2 - 5/9). Maintenance Cytoxan therapy was initiated on 5/11.      Plan:  - Continue home lamictal 200mg  BID  - Continue IV valproate to 500mg  qAM + 1000mg  nightly at 6pm for seizure and behavioral management   - Continue haldol 3mg  bid standing dose, benadryl 12.5 mg bid standing dose, lorazepam 1mg  bid standing dose   - Continue cenobamate 400mg  at bedtime   - Repeat GAD 65 Ab 5/19 (10 days after last PLEX session)  - Mnthly cycles of cyclophosphamide with MESNA for 3-6 months (see 5/9 progress note for references and Cytoxan/MESNA dosing details) - next dose due 05/06/22  - After 3-6 months, likely plan to switch to Actemra     # Psychosis - Schizophrenia: IVC currently in place. Behavioral response called for increasing combativeness in attempt to elope requiring IM Ativan 2mg , followed by Haldol. Patient seems emotionally labile-appearing calm amenable to admission on initial evaluation but after his father left the bedside he became agitated, loud, refusing admission and encroaching on examiner's personal space. Patient has required multiple psychiatric admissions in the past most recently held in our Psych holding unit in 04/2021 for behavioral problems. Of note, patient has ongoing significant behavioral issues including paranoia and attempts to leave hospital, requiring 4 point restraints. There have been 3 episodes of violent outbursts against nursing staff during this hospitalization. His GAD-65 disease is not likely the primary etiology of his severe psychiatric symptoms, which continue to be the barrier to his discharge. Psychiatry is following.   -- Psychiatry consulted, recs below  - IV Depakote 500mg  qAM + 1000mg  nightly (at 6pm) as above   - Depakote levels:   - 4/25 subtherapeutic 38.6   - 4/28 therapeutic at 68.1   - 4/29 subtherapeutic at 48.7              - 5/2 therapeutic at 55.3              - 5/5 therapeutic at 60.6  - Continue Haldol 5mg  BID PO   - Continue Benadryl 25mg  BID PO  - Continue lorazepam 1mg  BID PO   - Continue propranolol 20mg  BID in place of home propranolol XR 60mg  daily  - CONTINUE home lamictal 200  mg PO BID  - HOLD home trazodone 200 mg nightly  -- Agitation PRNs:   -- Continue Haldol 2mg  IM bid PRN   -- Continue Ativan 1mg  IM bid PRN   -- Continue Benadryl 12.5mg  IM bid PRN  -- continue thiamine PO/NGT supplementation daily   -- continue folate supplementation and multivitamin daily   -- continue telemetry monitoring    - EKG to monitor QTc (440 on 5/14)     # Polysubstance Use: Seen in [facility]. He has been referred to smoking cessation program. Patient endorses daily tobacco use and marijuana use (when visiting friends weekly). Denies recent cocaine use. Utox (+cannabinoids + benzos).  - Nicotine patch daily though patient refuses.      Discharge Planning:   - Case management: consulted. Recommendations appreciated.  - Social work: N/A  - PT: consulted, 5xL  - OT: consulted, 3x  - SLP: 3x  - Expected Discharge Disposition: Inpatient psychiatric facility vs home. Referral for [facility] placed. Expanded referrals statewide for inpatient psych admission.      Checklist:  - Diet: Regular diet  - IV fluids: none  - Bowel Regimen: senna  - GI PPX: No GI indications  - DVT PPX: Lovenox 40 mg [place] daily  - Lines/Access:   Patient Lines/Drains/Airways Status       Active Active Lines, Drains, & Airways       Name Placement date Placement time Site Days    Peripheral IV 03/31/22 Anterior;Left Forearm 03/31/22  1800  Forearm  9                  - Foley: No     Patient seen and discussed with attending physician, Dr. Nadara Mode who is in agreement with the assessment and plan.      Neurology Team B resident can be reached at [phone].    Aloha Gell, MD   Psychiatry, PGY-1     Data Review:     Contact Information:  Family contact: Zaid, Tomes (Father)   [phone] (Mobile)    PCP: [facility]    Medications:  Scheduled medications:    cenobamate  400 mg Oral At bedtime    calcium chloride        calcium chloride        diphenhydrAMINE  25 mg Oral BID    valproate sodium  1,000 mg Intravenous Nightly    Or    divalproex ER  1,000 mg Oral Nightly    valproate sodium  500 mg Intravenous Daily before breakfast    Or    divalproex ER  500 mg Oral Daily before breakfast    enoxaparin (LOVENOX) injection  40 mg Subcutaneous Q24H SCH    folic acid  1 mg Oral Daily    haloperidoL  5 mg Oral BID    heparin, porcine (PF)        heparin, porcine (PF)        lamoTRIgine  200 mg Oral BID    LORazepam  1 mg Oral BID    melatonin  3 mg Oral QPM    multivitamins, therapeutic with minerals  1 tablet Oral Daily    polyethylene glycol  17 g Oral Daily    propranoloL  20 mg Oral BID    senna  2 tablet Oral Nightly    thiamine mononitrate (vit B1)  100 mg Oral Daily     Continuous infusions:       PRN medications: acetaminophen,  haloperidol lactate **AND** diphenhydrAMINE, LORazepam, ondansetron    24 hour vital signs:  Temp:  [36.7 ??C (98 ??F)-36.8 ??C (98.3 ??F)] 36.7 ??C (98.1 ??F)  Heart Rate:  [61-94] 71  SpO2 Pulse:  [58-89] 71  Resp:  [16-26] 17  BP: (94-123)/(50-79) 119/58  MAP (mmHg):  [66-89] 75  SpO2:  [99 %-100 %] 100 %    Ins and Outs:  I/O this shift:  In: 120 [P.O.:120]  Out: -     Laboratory values:  All Labs Last 24hrs:   No results found for this or any previous visit (from the past 24 hour(s)).    Imaging:  Pertinent imaging discussed in the A/P section

## 2022-04-10 NOTE — Unmapped (Signed)
Pt compliant and pleasant throughout shift. Sitter at bedside continued for safety and elopement. Pt walked multiple laps around unit with PNA this shift. Pt remains intermediate status.     Problem: Adult Inpatient Plan of Care  Goal: Plan of Care Review  Outcome: Progressing  Flowsheets (Taken 04/10/2022 1611)  Progress: improving  Plan of Care Reviewed With: patient  Goal: Patient-Specific Goal (Individualized)  Outcome: Progressing  Flowsheets (Taken 04/10/2022 1611)  Patient-Specific Goals (Include Timeframe): Pt will discharge home tomorrow  Individualized Care Needs: ambulation, med regimine  Goal: Absence of Hospital-Acquired Illness or Injury  Outcome: Progressing  Intervention: Identify and Manage Fall Risk  Recent Flowsheet Documentation  Taken 04/10/2022 0800 by Hal Morales, RN  Safety Interventions:   aspiration precautions   bed alarm   elopement precautions   environmental modification   fall reduction program maintained   infection management   lighting adjusted for tasks/safety   low bed  Intervention: Prevent Skin Injury  Recent Flowsheet Documentation  Taken 04/10/2022 0800 by Hal Morales, RN  Skin Protection: adhesive use limited  Intervention: Prevent and Manage VTE (Venous Thromboembolism) Risk  Recent Flowsheet Documentation  Taken 04/10/2022 0800 by Hal Morales, RN  Activity Management: ambulated outside room  Intervention: Prevent Infection  Recent Flowsheet Documentation  Taken 04/10/2022 0800 by Hal Morales, RN  Infection Prevention:   cohorting utilized   environmental surveillance performed   equipment surfaces disinfected   hand hygiene promoted   personal protective equipment utilized   rest/sleep promoted   single patient room provided  Goal: Optimal Comfort and Wellbeing  Outcome: Progressing  Goal: Readiness for Transition of Care  Outcome: Progressing  Goal: Rounds/Family Conference  Outcome: Progressing     Problem: Seizure Disorder Comorbidity  Goal: Maintenance of Seizure Control  Outcome: Progressing  Intervention: Maintain Seizure-Symptom Control  Recent Flowsheet Documentation  Taken 04/10/2022 0800 by Hal Morales, RN  Seizure Precautions: activity supervised     Problem: Self-Care Deficit  Goal: Improved Ability to Complete Activities of Daily Living  Outcome: Progressing     Problem: Violence Risk or Actual  Goal: Anger and Impulse Control  Outcome: Progressing     Problem: VTE (Venous Thromboembolism)  Goal: VTE (Venous Thromboembolism) Symptom Resolution  Outcome: Progressing  Intervention: Prevent or Manage VTE (Venous Thromboembolism)  Recent Flowsheet Documentation  Taken 04/10/2022 1400 by Hal Morales, RN  Anti-Embolism Device Type: SCD, Knee  Anti-Embolism Intervention: Refused  Anti-Embolism Device Location: BLE  Taken 04/10/2022 1200 by Hal Morales, RN  Anti-Embolism Device Type: SCD, Knee  Anti-Embolism Intervention: Refused  Anti-Embolism Device Location: BLE  Taken 04/10/2022 1000 by Hal Morales, RN  Anti-Embolism Device Type: SCD, Knee  Anti-Embolism Intervention: Refused  Anti-Embolism Device Location: BLE  Taken 04/10/2022 0800 by Hal Morales, RN  Bleeding Precautions: blood pressure closely monitored  Anti-Embolism Device Type: SCD, Knee  Anti-Embolism Intervention: Refused  Anti-Embolism Device Location: BLE     Problem: Seizure, Active Management  Goal: Absence of Seizure/Seizure-Related Injury  Outcome: Progressing  Intervention: Prevent Seizure-Related Injury  Recent Flowsheet Documentation  Taken 04/10/2022 0800 by Hal Morales, RN  Seizure Precautions: activity supervised     Problem: Adjustment to Illness (Delirium)  Goal: Optimal Coping  Outcome: Progressing     Problem: Altered Behavior (Delirium)  Goal: Improved Behavioral Control  Outcome: Progressing     Problem: Attention and Thought Clarity Impairment (Delirium)  Goal: Improved Attention and Thought Clarity  Outcome: Progressing     Problem: Sleep Disturbance (Delirium)  Goal: Improved  Sleep  Outcome: Progressing     Problem: Fall Injury Risk  Goal: Absence of Fall and Fall-Related Injury  Outcome: Progressing  Intervention: Promote Injury-Free Environment  Recent Flowsheet Documentation  Taken 04/10/2022 0800 by Hal Morales, RN  Safety Interventions:   aspiration precautions   bed alarm   elopement precautions   environmental modification   fall reduction program maintained   infection management   lighting adjusted for tasks/safety   low bed     Problem: Skin Injury Risk Increased  Goal: Skin Health and Integrity  Outcome: Progressing  Intervention: Optimize Skin Protection  Recent Flowsheet Documentation  Taken 04/10/2022 0800 by Hal Morales, RN  Pressure Reduction Techniques:   frequent weight shift encouraged   weight shift assistance provided  Pressure Reduction Devices:   positioning supports utilized   pressure-redistributing mattress utilized  Skin Protection: adhesive use limited

## 2022-04-10 NOTE — Unmapped (Addendum)
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 Pt was pleasant most of the shift, got anxious and agitated in the middle of the shift when found out he couldn't go home today. Therapeutic communication maintained. Pt compliant with all nursing tasks. Sitter at bedside for IVC order required. Denied having pain. Pt ambulated multiple times around the unit with nursing staff accompanied. Voids spontaneously, had 2BMs this shift. Pt able to reposition in bed.     Problem: Adult Inpatient Plan of Care  Goal: Plan of Care Review  Outcome: Ongoing - Unchanged  Flowsheets (Taken 04/09/2022 1721)  Progress: no change  Plan of Care Reviewed With: patient  Goal: Patient-Specific Goal (Individualized)  Outcome: Ongoing - Unchanged  Flowsheets (Taken 04/09/2022 1721)  Patient-Specific Goals (Include Timeframe): Pt's BP will remain within order parameters during this shift  Individualized Care Needs: Sitter required for IVC order  Goal: Absence of Hospital-Acquired Illness or Injury  Outcome: Ongoing - Unchanged  Intervention: Identify and Manage Fall Risk  Recent Flowsheet Documentation  Taken 04/09/2022 0800 by Wilhemina Bonito, RN  Safety Interventions:   aspiration precautions   bed alarm   fall reduction program maintained   lighting adjusted for tasks/safety   low bed   nonskid shoes/slippers when out of bed  Intervention: Prevent Skin Injury  Recent Flowsheet Documentation  Taken 04/09/2022 0800 by Wilhemina Bonito, RN  Skin Protection:   adhesive use limited   incontinence pads utilized   silicone foam dressing in place  Intervention: Prevent and Manage VTE (Venous Thromboembolism) Risk  Recent Flowsheet Documentation  Taken 04/09/2022 1600 by Wilhemina Bonito, RN  Activity Management: ambulated to bathroom  Taken 04/09/2022 1400 by Wilhemina Bonito, RN  Activity Management: sitting, edge of bed  Taken 04/09/2022 1200 by Wilhemina Bonito, RN  Activity Management: activity adjusted per tolerance  Taken 04/09/2022 1000 by Wilhemina Bonito, RN  Activity Management: ambulated outside room  Taken 04/09/2022 0830 by Wilhemina Bonito, RN  Activity Management: ambulated outside room  Taken 04/09/2022 0800 by Wilhemina Bonito, RN  Activity Management: activity adjusted per tolerance  Intervention: Prevent Infection  Recent Flowsheet Documentation  Taken 04/09/2022 0800 by Wilhemina Bonito, RN  Infection Prevention:   cohorting utilized   equipment surfaces disinfected   environmental surveillance performed   hand hygiene promoted   personal protective equipment utilized   rest/sleep promoted   single patient room provided  Goal: Optimal Comfort and Wellbeing  Outcome: Ongoing - Unchanged  Goal: Readiness for Transition of Care  Outcome: Ongoing - Unchanged  Goal: Rounds/Family Conference  Outcome: Ongoing - Unchanged  Flowsheets  Taken 04/09/2022 1721  Participants:   case manager   patient   nursing   physician   occupational therapy  Taken 04/09/2022 1346  Participants:   nursing   case manager     Problem: Self-Care Deficit  Goal: Improved Ability to Complete Activities of Daily Living  Outcome: Ongoing - Unchanged     Problem: Seizure Disorder Comorbidity  Goal: Maintenance of Seizure Control  Outcome: Ongoing - Unchanged  Intervention: Maintain Seizure-Symptom Control  Recent Flowsheet Documentation  Taken 04/09/2022 0800 by Wilhemina Bonito, RN  Seizure Precautions: activity supervised     Problem: VTE (Venous Thromboembolism)  Goal: VTE (Venous Thromboembolism) Symptom Resolution  Outcome: Ongoing - Unchanged  Intervention: Prevent or Manage VTE (Venous Thromboembolism)  Recent Flowsheet Documentation  Taken 04/09/2022 1600 by Wilhemina Bonito, RN  Anti-Embolism Device  Type: SCD, Knee  Anti-Embolism Intervention: (Education provided) Refused  Anti-Embolism Device Location: BLE  Taken 04/09/2022 1400 by Wilhemina Bonito, RN  Anti-Embolism Device Type: SCD, Knee  Anti-Embolism Intervention: (Education provided) Refused  Anti-Embolism Device Location: BLE  Taken 04/09/2022 1200 by Wilhemina Bonito, RN  Anti-Embolism Device Type: SCD, Knee  Anti-Embolism Intervention: (Education provided) Refused  Anti-Embolism Device Location: BLE  Taken 04/09/2022 1000 by Wilhemina Bonito, RN  Anti-Embolism Device Type: SCD, Knee  Anti-Embolism Intervention: (Education provided) Refused  Anti-Embolism Device Location: BLE  Taken 04/09/2022 0800 by Wilhemina Bonito, RN  Bleeding Precautions: blood pressure closely monitored  Anti-Embolism Device Type: SCD, Knee  Anti-Embolism Intervention: (Education provided) Refused  Anti-Embolism Device Location: BLE     Problem: Seizure, Active Management  Goal: Absence of Seizure/Seizure-Related Injury  Outcome: Ongoing - Unchanged  Intervention: Prevent Seizure-Related Injury  Recent Flowsheet Documentation  Taken 04/09/2022 0800 by Wilhemina Bonito, RN  Seizure Precautions: activity supervised     Problem: Skin Injury Risk Increased  Goal: Skin Health and Integrity  Outcome: Ongoing - Unchanged  Intervention: Optimize Skin Protection  Recent Flowsheet Documentation  Taken 04/09/2022 1200 by Wilhemina Bonito, RN  Head of Bed [facility]) Positioning: HOB elevated  Taken 04/09/2022 0800 by Wilhemina Bonito, RN  Pressure Reduction Techniques:   frequent weight shift encouraged   weight shift assistance provided  Head of Bed (HOB) Positioning: HOB elevated  Pressure Reduction Devices: positioning supports utilized  Skin Protection:   adhesive use limited   incontinence pads utilized   silicone foam dressing in place     Problem: Fall Injury Risk  Goal: Absence of Fall and Fall-Related Injury  Outcome: Ongoing - Unchanged  Intervention: Promote Scientist, clinical (histocompatibility and immunogenetics) Documentation  Taken 04/09/2022 0800 by Wilhemina Bonito, RN  Safety Interventions:   aspiration precautions   bed alarm   fall reduction program maintained   lighting adjusted for tasks/safety   low bed   nonskid shoes/slippers when out of bed

## 2022-04-11 MED ADMIN — haloperidoL (HALDOL) tablet 5 mg: 5 mg | ORAL | @ 01:00:00

## 2022-04-11 MED ADMIN — diphenhydrAMINE (BENADRYL) capsule/tablet 25 mg: 25 mg | ORAL | @ 01:00:00

## 2022-04-11 MED ADMIN — LORazepam (ATIVAN) tablet 1 mg: 1 mg | ORAL | @ 01:00:00

## 2022-04-11 MED ADMIN — thiamine mononitrate (vit B1) tablet 100 mg: 100 mg | ORAL | @ 13:00:00

## 2022-04-11 MED ADMIN — LORazepam (ATIVAN) tablet 1 mg: 1 mg | ORAL | @ 13:00:00

## 2022-04-11 MED ADMIN — lamoTRIgine (LaMICtal) tablet 200 mg: 200 mg | ORAL | @ 22:00:00

## 2022-04-11 MED ADMIN — diphenhydrAMINE (BENADRYL) capsule/tablet 25 mg: 25 mg | ORAL | @ 13:00:00

## 2022-04-11 MED ADMIN — ** PATIENT-SUPPLIED, IN PYXIS 1 ** cenobamate 400 mg tablet: 400 mg | ORAL | @ 01:00:00

## 2022-04-11 MED ADMIN — divalproex ER (DEPAKOTE ER) extended released 24 hr tablet 1,000 mg: 1000 mg | ORAL | @ 22:00:00

## 2022-04-11 MED ADMIN — multivitamins, therapeutic with minerals tablet 1 tablet: 1 | ORAL | @ 13:00:00

## 2022-04-11 MED ADMIN — senna (SENOKOT) tablet 2 tablet: 2 | ORAL | @ 01:00:00 | Stop: 2022-04-11

## 2022-04-11 MED ADMIN — propranoloL (INDERAL) tablet 20 mg: 20 mg | ORAL | @ 13:00:00

## 2022-04-11 MED ADMIN — melatonin tablet 3 mg: 3 mg | ORAL | @ 22:00:00

## 2022-04-11 MED ADMIN — haloperidoL (HALDOL) tablet 5 mg: 5 mg | ORAL | @ 13:00:00

## 2022-04-11 MED ADMIN — folic acid (FOLVITE) tablet 1 mg: 1 mg | ORAL | @ 13:00:00

## 2022-04-11 MED ADMIN — propranoloL (INDERAL) tablet 20 mg: 20 mg | ORAL | @ 01:00:00

## 2022-04-11 MED ADMIN — enoxaparin (LOVENOX) syringe 40 mg: 40 mg | SUBCUTANEOUS | @ 01:00:00

## 2022-04-11 MED ADMIN — lamoTRIgine (LaMICtal) tablet 200 mg: 200 mg | ORAL | @ 13:00:00

## 2022-04-11 MED ADMIN — divalproex ER (DEPAKOTE ER) extended released 24 hr tablet 500 mg: 500 mg | ORAL | @ 13:00:00

## 2022-04-11 NOTE — Unmapped (Signed)
Received care of patient last evening, A+OX4, cooperative throughout shift. Sitter at bedside for safety. Patient has been compliant with care throughout shift. No complaints of pain. Vital signs have remained within normal limits. Medications given as ordered. Patient voiding adequate amounts of clear, yellow urine. Will continue to monitor patient's vital signs, pain level, I+Os, and neurologic status.   Problem: Adult Inpatient Plan of Care  Goal: Plan of Care Review  Outcome: Progressing  Goal: Patient-Specific Goal (Individualized)  Outcome: Progressing  Goal: Absence of Hospital-Acquired Illness or Injury  Outcome: Progressing  Intervention: Identify and Manage Fall Risk  Recent Flowsheet Documentation  Taken 04/11/2022 0400 by Margaretha Seeds, RN  Safety Interventions:   bed alarm   low bed   fall reduction program maintained  Taken 04/11/2022 0200 by Margaretha Seeds, RN  Safety Interventions:   bed alarm   low bed   fall reduction program maintained  Taken 04/11/2022 0000 by Margaretha Seeds, RN  Safety Interventions:   bed alarm   low bed   fall reduction program maintained  Taken 04/10/2022 2200 by Margaretha Seeds, RN  Safety Interventions:   bed alarm   low bed   fall reduction program maintained  Taken 04/10/2022 2000 by Margaretha Seeds, RN  Safety Interventions:   aspiration precautions   bed alarm   low bed   fall reduction program maintained  Intervention: Prevent Skin Injury  Recent Flowsheet Documentation  Taken 04/11/2022 0400 by Margaretha Seeds, RN  Skin Protection: adhesive use limited  Taken 04/11/2022 0200 by Margaretha Seeds, RN  Skin Protection: adhesive use limited  Taken 04/11/2022 0000 by Margaretha Seeds, RN  Skin Protection: adhesive use limited  Taken 04/10/2022 2200 by Margaretha Seeds, RN  Skin Protection: adhesive use limited  Taken 04/10/2022 2000 by Margaretha Seeds, RN  Skin Protection: adhesive use limited  Intervention: Prevent and Manage VTE (Venous Thromboembolism) Risk  Recent Flowsheet Documentation  Taken 04/11/2022 0400 by Margaretha Seeds, RN  Activity Management:   activity adjusted per tolerance   activity encouraged  Taken 04/11/2022 0200 by Margaretha Seeds, RN  Activity Management:   activity encouraged   activity adjusted per tolerance  Taken 04/11/2022 0000 by Margaretha Seeds, RN  Activity Management:   activity adjusted per tolerance   activity encouraged  Taken 04/10/2022 2200 by Margaretha Seeds, RN  Activity Management:   activity adjusted per tolerance   activity encouraged  Taken 04/10/2022 2000 by Margaretha Seeds, RN  Activity Management:   activity adjusted per tolerance   activity encouraged  Intervention: Prevent Infection  Recent Flowsheet Documentation  Taken 04/11/2022 0400 by Margaretha Seeds, RN  Infection Prevention: hand hygiene promoted  Taken 04/11/2022 0200 by Margaretha Seeds, RN  Infection Prevention: hand hygiene promoted  Taken 04/11/2022 0000 by Margaretha Seeds, RN  Infection Prevention: hand hygiene promoted  Taken 04/10/2022 2200 by Margaretha Seeds, RN  Infection Prevention: hand hygiene promoted  Taken 04/10/2022 2000 by Margaretha Seeds, RN  Infection Prevention: hand hygiene promoted  Goal: Optimal Comfort and Wellbeing  Outcome: Progressing  Goal: Readiness for Transition of Care  Outcome: Progressing  Goal: Rounds/Family Conference  Outcome: Progressing     Problem: Seizure Disorder Comorbidity  Goal: Maintenance of Seizure Control  Outcome: Progressing  Intervention: Maintain Seizure-Symptom Control  Recent Flowsheet Documentation  Taken 04/11/2022 0400 by Margaretha Seeds, RN  Seizure Precautions: activity supervised  Taken 04/11/2022 0200  by Margaretha Seeds, RN  Seizure Precautions: activity supervised  Taken 04/11/2022 0000 by Margaretha Seeds, RN  Seizure Precautions: activity supervised  Taken 04/10/2022 2200 by Margaretha Seeds, RN  Seizure Precautions: activity supervised  Taken 04/10/2022 2000 by Margaretha Seeds, RN  Seizure Precautions: activity supervised     Problem: Self-Care Deficit  Goal: Improved Ability to Complete Activities of Daily Living  Outcome: Progressing     Problem: Violence Risk or Actual  Goal: Anger and Impulse Control  Outcome: Progressing     Problem: VTE (Venous Thromboembolism)  Goal: VTE (Venous Thromboembolism) Symptom Resolution  Outcome: Progressing  Intervention: Prevent or Manage VTE (Venous Thromboembolism)  Recent Flowsheet Documentation  Taken 04/11/2022 0400 by Margaretha Seeds, RN  Bleeding Precautions: blood pressure closely monitored  Anti-Embolism Device Type: SCD, Knee  Anti-Embolism Intervention: (Education provided) Refused  Anti-Embolism Device Location: BLE  Taken 04/11/2022 0200 by Margaretha Seeds, RN  Bleeding Precautions: blood pressure closely monitored  Anti-Embolism Device Type: SCD, Knee  Anti-Embolism Intervention: (Education provided) Refused  Anti-Embolism Device Location: BLE  Taken 04/11/2022 0000 by Margaretha Seeds, RN  Bleeding Precautions: blood pressure closely monitored  Anti-Embolism Device Type: SCD, Knee  Anti-Embolism Intervention: (Education provided) Refused  Anti-Embolism Device Location: BLE  Taken 04/10/2022 2200 by Margaretha Seeds, RN  Bleeding Precautions: blood pressure closely monitored  Anti-Embolism Device Type: SCD, Knee  Anti-Embolism Intervention: (Education provided) Refused  Anti-Embolism Device Location: BLE  Taken 04/10/2022 2100 by Margaretha Seeds, RN  Anti-Embolism Device Type: SCD, Knee  Anti-Embolism Intervention: (Education provided) Refused  Anti-Embolism Device Location: BLE  Taken 04/10/2022 2000 by Margaretha Seeds, RN  Bleeding Precautions: blood pressure closely monitored  Anti-Embolism Device Type: SCD, Knee  Anti-Embolism Intervention: (Education provided) Refused  Anti-Embolism Device Location: BLE     Problem: Seizure, Active Management  Goal: Absence of Seizure/Seizure-Related Injury  Outcome: Progressing  Intervention: Prevent Seizure-Related Injury  Recent Flowsheet Documentation  Taken 04/11/2022 0400 by Margaretha Seeds, RN  Seizure Precautions: activity supervised  Taken 04/11/2022 0200 by Margaretha Seeds, RN  Seizure Precautions: activity supervised  Taken 04/11/2022 0000 by Margaretha Seeds, RN  Seizure Precautions: activity supervised  Taken 04/10/2022 2200 by Margaretha Seeds, RN  Seizure Precautions: activity supervised  Taken 04/10/2022 2000 by Margaretha Seeds, RN  Seizure Precautions: activity supervised     Problem: Adjustment to Illness (Delirium)  Goal: Optimal Coping  Outcome: Progressing     Problem: Altered Behavior (Delirium)  Goal: Improved Behavioral Control  Outcome: Progressing     Problem: Attention and Thought Clarity Impairment (Delirium)  Goal: Improved Attention and Thought Clarity  Outcome: Progressing     Problem: Sleep Disturbance (Delirium)  Goal: Improved Sleep  Outcome: Progressing     Problem: Fall Injury Risk  Goal: Absence of Fall and Fall-Related Injury  Outcome: Progressing  Intervention: Promote Injury-Free Environment  Recent Flowsheet Documentation  Taken 04/11/2022 0400 by Margaretha Seeds, RN  Safety Interventions:   bed alarm   low bed   fall reduction program maintained  Taken 04/11/2022 0200 by Margaretha Seeds, RN  Safety Interventions:   bed alarm   low bed   fall reduction program maintained  Taken 04/11/2022 0000 by Margaretha Seeds, RN  Safety Interventions:   bed alarm   low bed   fall reduction program maintained  Taken 04/10/2022 2200 by Margaretha Seeds, RN  Safety Interventions:   bed alarm   low  bed   fall reduction program maintained  Taken 04/10/2022 2000 by Margaretha Seeds, RN  Safety Interventions:   aspiration precautions   bed alarm   low bed   fall reduction program maintained     Problem: Skin Injury Risk Increased  Goal: Skin Health and Integrity  Outcome: Progressing  Intervention: Optimize Skin Protection  Recent Flowsheet Documentation  Taken 04/11/2022 0400 by Margaretha Seeds, RN  Pressure Reduction Techniques:   frequent weight shift encouraged   sit time limited to 2 hours   weight shift assistance provided  Head of Bed (HOB) Positioning: HOB at 30-45 degrees  Pressure Reduction Devices:   positioning supports utilized   pressure-redistributing mattress utilized  Skin Protection: adhesive use limited  Taken 04/11/2022 0200 by Margaretha Seeds, RN  Pressure Reduction Techniques:   frequent weight shift encouraged   sit time limited to 2 hours   weight shift assistance provided  Head of Bed (HOB) Positioning: HOB at 30-45 degrees  Pressure Reduction Devices:   positioning supports utilized   pressure-redistributing mattress utilized  Skin Protection: adhesive use limited  Taken 04/11/2022 0000 by Margaretha Seeds, RN  Pressure Reduction Techniques: frequent weight shift encouraged  Head of Bed (HOB) Positioning: HOB at 30-45 degrees  Pressure Reduction Devices:   positioning supports utilized   pressure-redistributing mattress utilized  Skin Protection: adhesive use limited  Taken 04/10/2022 2200 by Margaretha Seeds, RN  Pressure Reduction Techniques: frequent weight shift encouraged  Head of Bed (HOB) Positioning: HOB at 30-45 degrees  Pressure Reduction Devices:   pressure-redistributing mattress utilized   positioning supports utilized  Skin Protection: adhesive use limited  Taken 04/10/2022 2000 by Margaretha Seeds, RN  Pressure Reduction Techniques:   frequent weight shift encouraged   sit time limited to 2 hours   weight shift assistance provided  Head of Bed (HOB) Positioning: HOB at 30-45 degrees  Pressure Reduction Devices: positioning supports utilized   pressure-redistributing mattress utilized  Skin Protection: adhesive use limited
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
## 2022-04-12 MED ADMIN — thiamine mononitrate (vit B1) tablet 100 mg: 100 mg | ORAL | @ 13:00:00

## 2022-04-12 MED ADMIN — melatonin tablet 3 mg: 3 mg | ORAL | @ 22:00:00

## 2022-04-12 MED ADMIN — propranoloL (INDERAL) tablet 20 mg: 20 mg | ORAL | @ 01:00:00

## 2022-04-12 MED ADMIN — diphenhydrAMINE (BENADRYL) capsule/tablet 25 mg: 25 mg | ORAL | @ 13:00:00

## 2022-04-12 MED ADMIN — haloperidoL (HALDOL) tablet 5 mg: 5 mg | ORAL | @ 01:00:00

## 2022-04-12 MED ADMIN — enoxaparin (LOVENOX) syringe 40 mg: 40 mg | SUBCUTANEOUS | @ 01:00:00

## 2022-04-12 MED ADMIN — diphenhydrAMINE (BENADRYL) capsule/tablet 25 mg: 25 mg | ORAL | @ 01:00:00

## 2022-04-12 MED ADMIN — multivitamins, therapeutic with minerals tablet 1 tablet: 1 | ORAL | @ 13:00:00

## 2022-04-12 MED ADMIN — lamoTRIgine (LaMICtal) tablet 200 mg: 200 mg | ORAL | @ 22:00:00

## 2022-04-12 MED ADMIN — ** PATIENT-SUPPLIED, IN PYXIS 1 ** cenobamate 400 mg tablet: 400 mg | ORAL | @ 01:00:00

## 2022-04-12 MED ADMIN — divalproex ER (DEPAKOTE ER) extended released 24 hr tablet 1,000 mg: 1000 mg | ORAL | @ 22:00:00

## 2022-04-12 MED ADMIN — divalproex ER (DEPAKOTE ER) extended released 24 hr tablet 500 mg: 500 mg | ORAL | @ 13:00:00

## 2022-04-12 MED ADMIN — lamoTRIgine (LaMICtal) tablet 200 mg: 200 mg | ORAL | @ 13:00:00

## 2022-04-12 MED ADMIN — LORazepam (ATIVAN) tablet 1 mg: 1 mg | ORAL | @ 13:00:00

## 2022-04-12 MED ADMIN — haloperidoL (HALDOL) tablet 5 mg: 5 mg | ORAL | @ 13:00:00

## 2022-04-12 MED ADMIN — LORazepam (ATIVAN) tablet 1 mg: 1 mg | ORAL | @ 01:00:00

## 2022-04-12 MED ADMIN — folic acid (FOLVITE) tablet 1 mg: 1 mg | ORAL | @ 13:00:00

## 2022-04-12 NOTE — Unmapped (Signed)
Reason for contact      Care management      Psychosocial Update and Case Management:    SW received call from pt's father. Pt is still inpatient but his medical condition has improved. Father is reluctant to have patient return home. He is concerned about pt's delusions. They are working with ACT team to find an apartment with an aide, as pt has funding for this. SW suggested limits to set at home to deter pt from re-engaging with social contacts who use drugs, suggested they continue to consult ACT team & [facility] about safety needs and placement, offered to arrange treatment team meeting with [facility], requested they contact me with updates.       Follow-Up Plan:    Family has my contact information should there be any needs.

## 2022-04-12 NOTE — Unmapped (Signed)
V/s stable, patient remained free from falls.  Alert and oriented. No Pain complaints, bedside sitter present to ensure safety. Pt. Displayed appropriate behavior. No untoward signs and symptoms noted throughout the shift.    Problem: Adult Inpatient Plan of Care  Goal: Plan of Care Review  Outcome: Progressing  Goal: Patient-Specific Goal (Individualized)  Outcome: Progressing  Goal: Absence of Hospital-Acquired Illness or Injury  Outcome: Progressing  Intervention: Identify and Manage Fall Risk  Recent Flowsheet Documentation  Taken 04/12/2022 0200 by Lucy Chris, RN  Safety Interventions:   bed alarm   low bed   lighting adjusted for tasks/safety   sitter at bedside   fall reduction program maintained  Intervention: Prevent and Manage VTE (Venous Thromboembolism) Risk  Recent Flowsheet Documentation  Taken 04/12/2022 0200 by Lucy Chris, RN  Activity Management: activity adjusted per tolerance  Goal: Optimal Comfort and Wellbeing  Outcome: Progressing  Goal: Readiness for Transition of Care  Outcome: Progressing  Goal: Rounds/Family Conference  Outcome: Progressing     Problem: Seizure Disorder Comorbidity  Goal: Maintenance of Seizure Control  Outcome: Progressing     Problem: Self-Care Deficit  Goal: Improved Ability to Complete Activities of Daily Living  Outcome: Progressing     Problem: Violence Risk or Actual  Goal: Anger and Impulse Control  Outcome: Progressing     Problem: VTE (Venous Thromboembolism)  Goal: VTE (Venous Thromboembolism) Symptom Resolution  Outcome: Progressing  Intervention: Prevent or Manage VTE (Venous Thromboembolism)  Recent Flowsheet Documentation  Taken 04/12/2022 0200 by Lucy Chris, RN  Anti-Embolism Device Type: SCD, Knee  Anti-Embolism Intervention: Refused  Anti-Embolism Device Location: BLE  Taken 04/12/2022 0051 by Lucy Chris, RN  Anti-Embolism Device Type: SCD, Knee  Anti-Embolism Intervention: Refused  Anti-Embolism Device Location: BLE Problem: Seizure, Active Management  Goal: Absence of Seizure/Seizure-Related Injury  Outcome: Progressing     Problem: Adjustment to Illness (Delirium)  Goal: Optimal Coping  Outcome: Progressing     Problem: Altered Behavior (Delirium)  Goal: Improved Behavioral Control  Outcome: Progressing     Problem: Attention and Thought Clarity Impairment (Delirium)  Goal: Improved Attention and Thought Clarity  Outcome: Progressing     Problem: Sleep Disturbance (Delirium)  Goal: Improved Sleep  Outcome: Progressing     Problem: Fall Injury Risk  Goal: Absence of Fall and Fall-Related Injury  Outcome: Progressing  Intervention: Promote Injury-Free Environment  Recent Flowsheet Documentation  Taken 04/12/2022 0200 by Lucy Chris, RN  Safety Interventions:   bed alarm   low bed   lighting adjusted for tasks/safety   sitter at bedside   fall reduction program maintained     Problem: Skin Injury Risk Increased  Goal: Skin Health and Integrity  Outcome: Progressing  Intervention: Optimize Skin Protection  Recent Flowsheet Documentation  Taken 04/12/2022 0200 by Lucy Chris, RN  Head of Bed [facility]) Positioning: HOB at 30-45 degrees  Taken 04/12/2022 0051 by Lucy Chris, RN  Pressure Reduction Techniques: frequent weight shift encouraged

## 2022-04-12 NOTE — Unmapped (Addendum)
Behavior was appropriate and cooperative throughout shift.  Pt. Agreeable to take oral medication at this time. Pt. Remained free of falls and injury this shift. No outstanding needs expressed at this time.     Problem: Adult Inpatient Plan of Care  Goal: Plan of Care Review  Outcome: Progressing  Goal: Patient-Specific Goal (Individualized)  Outcome: Progressing  Goal: Absence of Hospital-Acquired Illness or Injury  Outcome: Progressing  Intervention: Identify and Manage Fall Risk  Recent Flowsheet Documentation  Taken 04/12/2022 1400 by Wende Bushy, RN  Safety Interventions:   bed alarm   fall reduction program maintained   lighting adjusted for tasks/safety  Goal: Optimal Comfort and Wellbeing  Outcome: Progressing  Goal: Readiness for Transition of Care  Outcome: Progressing  Goal: Rounds/Family Conference  Outcome: Progressing     Problem: Seizure Disorder Comorbidity  Goal: Maintenance of Seizure Control  Outcome: Progressing     Problem: Self-Care Deficit  Goal: Improved Ability to Complete Activities of Daily Living  Outcome: Progressing     Problem: Violence Risk or Actual  Goal: Anger and Impulse Control  Outcome: Progressing     Problem: VTE (Venous Thromboembolism)  Goal: VTE (Venous Thromboembolism) Symptom Resolution  Outcome: Progressing  Intervention: Prevent or Manage VTE (Venous Thromboembolism)  Recent Flowsheet Documentation  Taken 04/12/2022 1600 by Wende Bushy, RN  Anti-Embolism Intervention: Refused  Taken 04/12/2022 1400 by Wende Bushy, RN  Anti-Embolism Intervention: Refused     Problem: Seizure, Active Management  Goal: Absence of Seizure/Seizure-Related Injury  Outcome: Progressing     Problem: Adjustment to Illness (Delirium)  Goal: Optimal Coping  Outcome: Progressing     Problem: Altered Behavior (Delirium)  Goal: Improved Behavioral Control  Outcome: Progressing     Problem: Attention and Thought Clarity Impairment (Delirium)  Goal: Improved Attention and Thought Clarity  Outcome: Progressing     Problem: Sleep Disturbance (Delirium)  Goal: Improved Sleep  Outcome: Progressing     Problem: Fall Injury Risk  Goal: Absence of Fall and Fall-Related Injury  Outcome: Progressing  Intervention: Promote Injury-Free Environment  Recent Flowsheet Documentation  Taken 04/12/2022 1400 by Wende Bushy, RN  Safety Interventions:   bed alarm   fall reduction program maintained   lighting adjusted for tasks/safety     Problem: Skin Injury Risk Increased  Goal: Skin Health and Integrity  Outcome: Progressing  Intervention: Optimize Skin Protection  Recent Flowsheet Documentation  Taken 04/12/2022 1400 by Wende Bushy, RN  Head of Bed [facility]) Positioning: HOB at 30 degrees

## 2022-04-12 NOTE — Unmapped (Signed)
Neurology Inpatient Team B (NMB)  Daily Progress Note       Patient: Austin Banks  Code Status: Full Code  Level of Care: Step-down status.   LOS: 30 days      Overnight Events & Subjective:     - Patient transferred from ISCU to floor overnight. Family visited yesterday, would not like patient to discharge until housing is found through ACT team  - VSS, no PRNs needed overnight  - Patient remains calm and cooperative today without restraints, endorses feeling tired due to trouble sleeping, noted to be more alert and awake in afternoon  - Likely discharge to family in coming days, who are coordinating with ACT to secure housing in the community       Physical Exam:     General Exam:   General Appearance: observed lying in bed. In NAD   Lungs: Normal work of breathing.    Extremities: No clubbing or cyanosis.     Neurological Exam:   Mental Status:   Alert, looking out window     Cranial Nerves:   Mild L facial droop at rest (baseline).   EOM grossly intact.   Hearing intact to conversation.  Nystagmus present on exam     Sensory exam:   Light touch intact in bilateral upper extremities and bilateral lower extremities.     Motor Exam:   Normal bulk   Moving all extremities spontaneously and equally against gravity     Reflexes:   Deferred. Previously 1+ throughout bilaterally, Hoffmann's absent bilaterally, Toes down going bilaterally    Cerebellar/Coordination/Gai:  Deferred. Previously observed taking a few steps around bed in hospital room, unassisted.        Assessment/Plan:     Assessment: Austin Banks is a 31 y.o. 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 He was found to have elevated GAD 65 antibody level compared to prior so he was treated for a flare however he has not had any seizures, (clinically or electrographically) which is the primary manifestation of his GAD 65 disease. His primary inpatient issue has been paranoid delusions with behavioral outbursts. He has now completed a series of plasma exchange pheresis for GAD 65 flare and started Cytoxan for maintenance therapy. Further hospitalization was needed due to paranoid delusions with poor insight, judgement and impulse control that made discharge to home unsafe, though patient has been markedly improving and discharge being coordinated to home with family and ACT team follow-up in coming days.      # Dysarthria - AMS - Imbalance - Difficulty Ambulating: Patient presented with 2 weeks of worsening balance and fluctuating mental status in the setting of recent medication changes, frequent marijuana use and falls associated with seizure and subsequent head injury. His symptoms are likely medication and drug related given the multiple CNS sedating meds he requires for seizure control with concurrent drug use. Other than the dysarthria which has improved and the multi-directional nystagmus, there are no other focal neurological findings on exam. With his history of medically refractory epilepsy and questionable compliance, it was important to rule out subclinical events contributing to his fluctuating mental status even though no clinical seizures had been observed prior to admission. During admission, there have been no clinical seizures and  no seizures seen on EEG monitoring.     # GAD 65 c/b focal to bilateral tonic clonic seizures: Requires multiple medications for control, recently underwent evaluation for DBS vs VNS candidacy. Patient endorses compliance with ASMs, however, he spends half the week with friends per father, partying and smoking marijuana and so compliance is questionable. He has had subtherapeutic levels of Lamictal in the past. Valproic acid was obtained on admission but collected after he received 500mg  IV. Last Rituxan dose was 4/24. VPA level was 74. UTox positive for cannabinoids and benzos. cvEEG (03/14/22 0430 to 03/15/22 11:30 AM ): There were bilateral independent (L>>R) temporal epileptiform discharges indicating focal cortical irritation and increased potential for seizure activity over there regions. There was moderate encephalopathy which is non-specific in etiology. There are no seizures. EEG discontinued on 4/20; restarted on 5/2 x2 days with bitemporal sharp waves and R>L temporal slowing. These EEG findings do not warrant escalation of ASM's nor do they explain patient's ongoing psychiatric symptoms. Previously treated with Onfi, which was discontinued due to oversedation and Fycompa due to reports of potentially increased agitation with use (i4/20, dc 4/27). 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 No abnormal enhancement GAD 65 ab elevated to 163 on 4/19; was previously 40.4 in June 2021. Patient was treated for acute GAD 65 flare with PLEx x5 (5/2 - 5/9). Maintenance Cytoxan therapy was initiated on 5/11.      Plan:  - Continue home lamictal 200mg  BID  - Continue IV valproate to 500mg  qAM + 1000mg  nightly at 6pm for seizure and behavioral management   - Continue haldol 3mg  bid standing dose, benadryl 12.5 mg bid standing dose, lorazepam 1mg  bid standing dose   - Continue cenobamate 400mg  at bedtime   - Repeat GAD 65 Ab 5/19 (10 days after last PLEX session)  - Monthly cycles of cyclophosphamide with MESNA for 3-6 months (see 5/9 progress note for references and Cytoxan/MESNA dosing details) - next dose due 05/06/22  - After 3-6 months, likely plan to switch to Actemra     # Psychosis - Schizophrenia: IVC currently in place. Behavioral response called for increasing combativeness in attempt to elope requiring IM Ativan 2mg , followed by Haldol. Patient seems emotionally labile-appearing calm amenable to admission on initial evaluation but after his father left the bedside he became agitated, loud, refusing admission and encroaching on examiner's personal space. Patient has required multiple psychiatric admissions in the past most recently held in our Psych holding unit in 04/2021 for behavioral problems. Of note, patient has ongoing significant behavioral issues including paranoia and attempts to leave hospital, requiring 4 point restraints. There have been 3 episodes of violent outbursts against nursing staff during this hospitalization. His GAD-65 disease is not likely the primary etiology of his severe psychiatric symptoms, which continue to be the barrier to his discharge. Psychiatry is following.   -- Psychiatry consulted, recs below  - IV Depakote 500mg  qAM + 1000mg  nightly (at 6pm) as above   - Depakote levels:   - 4/25 subtherapeutic 38.6   - 4/28 therapeutic at 68.1   - 4/29 subtherapeutic at 48.7              - 5/2 therapeutic at 55.3              - 5/5 therapeutic at 60.6  - Continue Haldol 5mg  BID PO   - Continue Benadryl 25mg  BID PO  - Continue lorazepam 1mg   BID PO   - Continue propranolol 20mg  BID in place of home propranolol XR 60mg  daily  - CONTINUE home lamictal 200 mg PO BID  - HOLD home trazodone 200 mg nightly  -- Agitation PRNs:   -- Continue Haldol 5mg  PO BID PRN   -- Continue Ativan 1mg  PO BID PRN   -- Discontinued Benadryl 12.5mg  IM bid PRN  -- continue thiamine PO/NGT supplementation daily   -- continue folate supplementation and multivitamin daily   -- continue telemetry monitoring    - EKG to monitor QTc (417 on 5/18)     # Polysubstance Use: Seen in [facility]. He has been referred to smoking cessation program. Patient endorses daily tobacco use and marijuana use (when visiting friends weekly). Denies recent cocaine use. Utox (+cannabinoids + benzos).  - Nicotine patch daily though patient refuses.      Discharge Planning: - Case management: arranging txt team meeting with [facility]  - Social work: N/A  - PT: consulted, no needs  - OT: consulted, no needs  - SLP: 3x  - Expected Discharge Disposition: Likely discharge to home with family this week     Checklist:  - Diet: Regular diet  - IV fluids: none  - Bowel Regimen: senna  - GI PPX: No GI indications  - DVT PPX: Lovenox 40 mg [place] daily  - Lines/Access:   Patient Lines/Drains/Airways Status       Active Active Lines, Drains, & Airways       Name Placement date Placement time Site Days    Peripheral IV 03/31/22 Anterior;Left Forearm 03/31/22  1800  Forearm  11                  - Foley: No     Patient seen and discussed with attending physician, Dr. Nadara Mode who is in agreement with the assessment and plan.      Neurology Team B resident can be reached at [phone].    Aloha Gell, MD   Psychiatry, PGY-1     Data Review:     Contact Information:  Family contact: Jovann, Luse (Father)   [phone] (Mobile)    PCP: [facility]    Medications:  Scheduled medications:    cenobamate  400 mg Oral At bedtime    calcium chloride        calcium chloride        diphenhydrAMINE  25 mg Oral BID    valproate sodium  1,000 mg Intravenous Nightly    Or    divalproex ER  1,000 mg Oral Nightly    valproate sodium  500 mg Intravenous Daily before breakfast    Or    divalproex ER  500 mg Oral Daily before breakfast    enoxaparin (LOVENOX) injection  40 mg Subcutaneous Q24H SCH    folic acid  1 mg Oral Daily    haloperidoL  5 mg Oral BID    heparin, porcine (PF)        heparin, porcine (PF)        lamoTRIgine  200 mg Oral BID    LORazepam  1 mg Oral BID    melatonin  3 mg Oral QPM    multivitamins, therapeutic with minerals  1 tablet Oral Daily    polyethylene glycol  17 g Oral Daily    propranoloL  20 mg Oral BID    thiamine mononitrate (vit B1)  100 mg Oral Daily     Continuous infusions:  PRN medications: acetaminophen, haloperidoL, LORazepam, ondansetron    24 hour vital signs:  Temp:  [36 ??C (96.8 ??F)-36.9 ??C (98.4 ??F)] 36.7 ??C (98.1 ??F)  Heart Rate:  [63-89] 89  SpO2 Pulse:  [88] 88  Resp:  [16-20] 18  BP: (91-116)/(47-60) 103/59  MAP (mmHg):  [58-73] 73  SpO2:  [98 %-100 %] 100 %    Ins and Outs:  I/O this shift:  In: 600 [P.O.:600]  Out: 600 [Urine:600]    Laboratory values:  All Labs Last 24hrs:   Recent Results (from the past 24 hour(s))   ECG 12 Lead    Collection Time: 04/12/22  7:37 AM   Result Value Ref Range    EKG Systolic BP  mmHg    EKG Diastolic BP  mmHg    EKG Ventricular Rate 63 BPM    EKG Atrial Rate 63 BPM    EKG P-R Interval 134 ms    EKG QRS Duration 94 ms    EKG Q-T Interval 408 ms    EKG QTC Calculation 417 ms    EKG Calculated P Axis 21 degrees    EKG Calculated R Axis 57 degrees    EKG Calculated T Axis 17 degrees    QTC Fredericia 414 ms       Imaging:  Pertinent imaging discussed in the A/P section

## 2022-04-12 NOTE — Unmapped (Signed)
Neurology Inpatient Team B (NMB)  Daily Progress Note       Patient: Austin Banks  Code Status: Full Code  Level of Care: Step-down status.   LOS: 29 days      Overnight Events & Subjective:     - No AOE, VSS  - No PRNs needed overnight  - Patient remains calm and cooperative today without restraints   - Likely discharge to home with family in coming days with follow-up with ACT team in the community       Physical Exam:     General Exam:   General Appearance: observed lying in bed. In NAD   Lungs: Normal work of breathing.    Extremities: No clubbing or cyanosis.     Neurological Exam:   Mental Status:   Alert, looking out window     Cranial Nerves:   Mild L facial droop at rest (baseline).   EOM grossly intact.   Hearing intact to conversation.  Nystagmus present on exam     Sensory exam:   Not done today. Previously light touch has been normal in the bilateral upper extremities and bilateral lower extremities.     Motor Exam:   Normal bulk   Moving all extremities spontaneously and equally against gravity     Reflexes:   Deferred. Previously 1+ throughout bilaterally, Hoffmann's absent bilaterally, Toes down going bilaterally    Cerebellar/Coordination/Gai:  Deferred. Previously observed taking a few steps around bed in hospital room, unassisted.        Assessment/Plan:     Assessment: Adnan Vanvoorhis is a 31 y.o. male with a past medical history of GAD 14 associated focal onset epilepsy, previously treated with Rituxan and monthly IVIG, polysubstance use (currently marijuana, tobacco, remote cocaine use), psychosis, schizophrenia, depression, and anxiety, who was admitted to [facility] on 03/13/22 for worsening balance, dysarthria, cognition, weight loss and poor appetite. He was found to have elevated GAD 65 antibody level compared to prior so he was treated for a flare however he has not had any seizures, (clinically or electrographically) which is the primary manifestation of his GAD 65 disease. His primary inpatient issue has been paranoid delusions with behavioral outbursts. He has now completed a series of plasma exchange pheresis for GAD 65 flare and started Cytoxan for maintenance therapy. Further hospitalization was needed due to paranoid delusions with poor insight, judgement and impulse control that made discharge to home unsafe, though patient has been markedly improving and discharge being coordinated to home with family and ACT team follow-up in coming days.      # Dysarthria - AMS - Imbalance - Difficulty Ambulating: Patient presented with 2 weeks of worsening balance and fluctuating mental status in the setting of recent medication changes, frequent marijuana use and falls associated with seizure and subsequent head injury. His symptoms are likely medication and drug related given the multiple CNS sedating meds he requires for seizure control with concurrent drug use. Other than the dysarthria which has improved and the multi-directional nystagmus, there are no other focal neurological findings on exam. With his history of medically refractory epilepsy and questionable compliance, it was important to rule out subclinical events contributing to his fluctuating mental status even though no clinical seizures had been observed prior to admission. During admission, there have been no clinical seizures and no seizures seen on EEG monitoring.     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 Patient endorses compliance with ASMs, however, he spends half the week with friends per father, partying and smoking marijuana and so compliance is questionable. He has had subtherapeutic levels of Lamictal in the past. Valproic acid was obtained on admission but collected after he received 500mg  IV. Last Rituxan dose was 4/24. VPA level was 74. UTox positive for cannabinoids and benzos. cvEEG (03/14/22 0430 to 03/15/22 11:30 AM ): There were bilateral independent (L>>R) temporal epileptiform discharges indicating focal cortical irritation and increased potential for seizure activity over there regions. There was moderate encephalopathy which is non-specific in etiology. There are no seizures. EEG discontinued on 4/20; restarted on 5/2 x2 days with bitemporal sharp waves and R>L temporal slowing. These EEG findings do not warrant escalation of ASM's nor do they explain patient's ongoing psychiatric symptoms. Previously treated with Onfi, which was discontinued due to oversedation and Fycompa due to reports of potentially increased agitation with use (i4/20, dc 4/27). 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 No abnormal enhancement GAD 65 ab elevated to 163 on 4/19; was previously 40.4 in June 2021. Patient was treated for acute GAD 65 flare with PLEx x5 (5/2 - 5/9). Maintenance Cytoxan therapy was initiated on 5/11.      Plan:  - Continue home lamictal 200mg  BID  - Continue IV valproate to 500mg  qAM + 1000mg  nightly at 6pm for seizure and behavioral management   - Continue haldol 3mg  bid standing dose, benadryl 12.5 mg bid standing dose, lorazepam 1mg  bid standing dose   - Continue cenobamate 400mg  at bedtime   - Repeat GAD 65 Ab 5/19 (10 days after last PLEX session)  - Monthly cycles of cyclophosphamide with MESNA for 3-6 months (see 5/9 progress note for references and Cytoxan/MESNA dosing details) - next dose due 05/06/22  - After 3-6 months, likely plan to switch to Actemra     # Psychosis - Schizophrenia: IVC currently in place. Behavioral response called for increasing combativeness in attempt to elope requiring IM Ativan 2mg , followed by Haldol. Patient seems emotionally labile-appearing calm amenable to admission on initial evaluation but after his father left the bedside he became agitated, loud, refusing admission and encroaching on examiner's personal space. Patient has required multiple psychiatric admissions in the past most recently held in our Psych holding unit in 04/2021 for behavioral problems. Of note, patient has ongoing significant behavioral issues including paranoia and attempts to leave hospital, requiring 4 point restraints. There have been 3 episodes of violent outbursts against nursing staff during this hospitalization. His GAD-65 disease is not likely the primary etiology of his severe psychiatric symptoms, which continue to be the barrier to his discharge. Psychiatry is following.   -- Psychiatry consulted, updated recs below  - IV Depakote 500mg  qAM + 1000mg  nightly (at 6pm) as above   - Depakote levels:   - 4/25 subtherapeutic 38.6   - 4/28 therapeutic at 68.1   - 4/29 subtherapeutic at 48.7              - 5/2 therapeutic at 55.3              - 5/5 therapeutic at 60.6  - Continue Haldol 5mg  BID PO   - Continue Benadryl 25mg  BID PO  - Continue lorazepam 1mg  BID PO   - Continue propranolol 20mg  BID in place of home propranolol XR 60mg  daily  - CONTINUE home lamictal 200 mg PO BID  - HOLD  home trazodone 200 mg nightly  -- Agitation PRNs:   -- Changed Haldol 2mg  IM bid PRN to 5mg  PO BID PRN   -- Changed Ativan 1mg  IM bid PRN to 1mg  PO BID PRN   -- Discontinued Benadryl 12.5mg  IM bid PRN  -- continue thiamine PO/NGT supplementation daily   -- continue folate supplementation and multivitamin daily   -- continue telemetry monitoring    - EKG to monitor QTc (440 on 5/14)     # Polysubstance Use: Seen in [facility]. He has been referred to smoking cessation program. Patient endorses daily tobacco use and marijuana use (when visiting friends weekly). Denies recent cocaine use. Utox (+cannabinoids + benzos).  - Nicotine patch daily though patient refuses.      Discharge Planning:   - Case management: consulted. Recommendations appreciated.  - Social work: N/A  - PT: consulted, no needs  - OT: consulted, no needs  - SLP: 3x  - Expected Discharge Disposition: Likely discharge to home with family this week     Checklist:  - Diet: Regular diet  - IV fluids: none  - Bowel Regimen: senna  - GI PPX: No GI indications  - DVT PPX: Lovenox 40 mg [place] daily  - Lines/Access:   Patient Lines/Drains/Airways Status       Active Active Lines, Drains, & Airways       Name Placement date Placement time Site Days    Peripheral IV 03/31/22 Anterior;Left Forearm 03/31/22  1800  Forearm  11                  - Foley: No     Patient seen and discussed with attending physician, Dr. Nadara Mode who is in agreement with the assessment and plan.      Neurology Team B resident can be reached at [phone].    Aloha Gell, MD   Psychiatry, PGY-1     Data Review:     Contact Information:  Family contact: Thorvald, Orsino (Father)   [phone] (Mobile)    PCP: [facility]    Medications:  Scheduled medications:    cenobamate  400 mg Oral At bedtime    calcium chloride        calcium chloride        diphenhydrAMINE  25 mg Oral BID    valproate sodium  1,000 mg Intravenous Nightly    Or    divalproex ER  1,000 mg Oral Nightly    valproate sodium  500 mg Intravenous Daily before breakfast    Or    divalproex ER  500 mg Oral Daily before breakfast    enoxaparin (LOVENOX) injection  40 mg Subcutaneous Q24H SCH    folic acid  1 mg Oral Daily    haloperidoL  5 mg Oral BID    heparin, porcine (PF)        heparin, porcine (PF)        lamoTRIgine  200 mg Oral BID    LORazepam  1 mg Oral BID    melatonin  3 mg Oral QPM    multivitamins, therapeutic with minerals  1 tablet Oral Daily    polyethylene glycol  17 g Oral Daily    propranoloL  20 mg Oral BID    senna  2 tablet Oral Nightly    thiamine mononitrate (vit B1)  100 mg Oral Daily     Continuous infusions:       PRN medications: acetaminophen, haloperidol lactate **AND**  diphenhydrAMINE, LORazepam, ondansetron    24 hour vital signs:  Temp:  [36.9 ??C (98.4 ??F)-37 ??C (98.6 ??F)] 37 ??C (98.6 ??F)  Heart Rate:  [82-88] 84  SpO2 Pulse: [69-93] 88  Resp:  [11-20] 17  BP: (108-113)/(56-64) 112/64  MAP (mmHg):  [71-80] 80  SpO2:  [98 %-100 %] 98 %    Ins and Outs:  No intake/output data recorded.    Laboratory values:  All Labs Last 24hrs:   No results found for this or any previous visit (from the past 24 hour(s)).    Imaging:  Pertinent imaging discussed in the A/P section

## 2022-04-12 NOTE — Unmapped (Signed)
Problem: Adult Inpatient Plan of Care  Goal: Plan of Care Review  Outcome: Ongoing - Unchanged    Patient shift note: pt rested well this shift.  VSS. Alert and oriented x4.  Pt very frustrated about not being discharged today.  Pt has frustrations with his parents.  Asking frequent questions signing himself out.  PNA at bedside this hsift.  Voiding independently.  Ambulating independently.      Problem: Fall Injury Risk  Goal: Absence of Fall and Fall-Related Injury  Outcome: Ongoing - Unchanged    Patient understands need to call for help before attempting to move.  Call bell in reach.  Bed in low locked position.  Side rails up X4.       Problem: Skin Injury Risk Increased  Goal: Skin Health and Integrity  Outcome: Ongoing - Unchanged    Skin Impairment - Patient monitored for skin impairment (color changes, redness, swelling, warmth,pain and other signs of infection. Special attention to boney prominences, skin folds, the sacrum, and heels. Monitored the status of patients continence minimizing exposure of the skin and other areas of moisture.

## 2022-04-13 LAB — BASIC METABOLIC PANEL
ANION GAP: 6 mmol/L (ref 5–14)
BLOOD UREA NITROGEN: 10 mg/dL (ref 9–23)
BUN / CREAT RATIO: 16
CALCIUM: 9.7 mg/dL (ref 8.7–10.4)
CHLORIDE: 104 mmol/L (ref 98–107)
CO2: 29 mmol/L (ref 20.0–31.0)
CREATININE: 0.64 mg/dL
EGFR CKD-EPI (2021) MALE: >90 mL/min/{1.73_m2} (ref >=60)
GLUCOSE RANDOM: 94 mg/dL (ref 70–179)
POTASSIUM: 4.2 mmol/L (ref 3.4–4.8)
SODIUM: 139 mmol/L (ref 135–145)

## 2022-04-13 LAB — URINALYSIS WITH MICROSCOPY
BACTERIA: NONE SEEN /HPF
BILIRUBIN UA: NEGATIVE
BLOOD UA: NEGATIVE
GLUCOSE UA: NEGATIVE
KETONES UA: NEGATIVE
LEUKOCYTE ESTERASE UA: NEGATIVE
NITRITE UA: NEGATIVE
PH UA: 8 (ref 5.0–9.0)
PROTEIN UA: NEGATIVE
RBC UA: <1 /HPF (ref <=3)
SPECIFIC GRAVITY UA: 1.006 (ref 1.003–1.030)
SQUAMOUS EPITHELIAL: <1 /HPF (ref 0–5)
UROBILINOGEN UA: <2
WBC UA: <1 /HPF (ref <=2)

## 2022-04-13 LAB — CBC
HEMATOCRIT: 34.8 % — ABNORMAL LOW (ref 39.0–48.0)
HEMOGLOBIN: 12.1 g/dL — ABNORMAL LOW (ref 12.9–16.5)
MEAN CORPUSCULAR HEMOGLOBIN CONC: 34.7 g/dL (ref 32.0–36.0)
MEAN CORPUSCULAR HEMOGLOBIN: 32.1 pg (ref 25.9–32.4)
MEAN CORPUSCULAR VOLUME: 92.4 fL (ref 77.6–95.7)
MEAN PLATELET VOLUME: 7.6 fL (ref 6.8–10.7)
PLATELET COUNT: 270 10*9/L (ref 150–450)
RED BLOOD CELL COUNT: 3.76 10*12/L — ABNORMAL LOW (ref 4.26–5.60)
RED CELL DISTRIBUTION WIDTH: 13.1 % (ref 12.2–15.2)
WBC ADJUSTED: 3 10*9/L — ABNORMAL LOW (ref 3.6–11.2)

## 2022-04-13 MED ORDER — DIPHENHYDRAMINE 25 MG TABLET
Freq: Two times a day (BID) | ORAL | 0 refills | 15.00000 days | Status: CP
Start: 2022-04-13 — End: ?

## 2022-04-13 MED ORDER — MELATONIN 3 MG TABLET
ORAL_TABLET | Freq: Every evening | ORAL | 0 refills | 30.00000 days | Status: CP
Start: 2022-04-13 — End: 2022-05-13

## 2022-04-13 MED ORDER — LAMOTRIGINE 200 MG TABLET
ORAL_TABLET | Freq: Two times a day (BID) | ORAL | 2 refills | 30.00000 days | Status: CP
Start: 2022-04-13 — End: ?

## 2022-04-13 MED ORDER — DIVALPROEX ER 500 MG TABLET,EXTENDED RELEASE 24 HR
ORAL_TABLET | ORAL | 2 refills | 30.00000 days | Status: CP
Start: 2022-04-13 — End: ?

## 2022-04-13 MED ORDER — HALOPERIDOL 5 MG TABLET
ORAL_TABLET | Freq: Two times a day (BID) | ORAL | 0 refills | 30.00000 days | Status: CP
Start: 2022-04-13 — End: 2022-05-13

## 2022-04-13 MED ORDER — CENOBAMATE 200 MG TABLET
ORAL_TABLET | Freq: Every evening | ORAL | 3 refills | 0.00000 days | Status: CP
Start: 2022-04-13 — End: ?

## 2022-04-13 MED ORDER — PROPRANOLOL 20 MG TABLET
ORAL_TABLET | Freq: Two times a day (BID) | ORAL | 0 refills | 30.00000 days | Status: CP
Start: 2022-04-13 — End: 2022-05-13

## 2022-04-13 MED ORDER — LORAZEPAM 1 MG TABLET
ORAL_TABLET | Freq: Two times a day (BID) | ORAL | 0 refills | 30.00000 days | Status: CP
Start: 2022-04-13 — End: 2022-05-13

## 2022-04-13 MED ADMIN — LORazepam (ATIVAN) tablet 1 mg: 1 mg | ORAL | @ 01:00:00

## 2022-04-13 MED ADMIN — haloperidoL (HALDOL) tablet 5 mg: 5 mg | ORAL

## 2022-04-13 MED ADMIN — diphenhydrAMINE (BENADRYL) capsule/tablet 25 mg: 25 mg | ORAL | @ 12:00:00 | Stop: 2022-04-13

## 2022-04-13 MED ADMIN — lamoTRIgine (LaMICtal) tablet 200 mg: 200 mg | ORAL | @ 12:00:00 | Stop: 2022-04-13

## 2022-04-13 MED ADMIN — enoxaparin (LOVENOX) syringe 40 mg: 40 mg | SUBCUTANEOUS | @ 01:00:00

## 2022-04-13 MED ADMIN — LORazepam (ATIVAN) tablet 1 mg: 1 mg | ORAL | @ 12:00:00 | Stop: 2022-04-13

## 2022-04-13 MED ADMIN — divalproex ER (DEPAKOTE ER) extended released 24 hr tablet 500 mg: 500 mg | ORAL | @ 12:00:00 | Stop: 2022-04-13

## 2022-04-13 MED ADMIN — haloperidoL (HALDOL) tablet 5 mg: 5 mg | ORAL | @ 12:00:00 | Stop: 2022-04-13

## 2022-04-13 MED ADMIN — diphenhydrAMINE (BENADRYL) capsule/tablet 25 mg: 25 mg | ORAL | @ 01:00:00

## 2022-04-13 MED ADMIN — thiamine mononitrate (vit B1) tablet 100 mg: 100 mg | ORAL | @ 12:00:00 | Stop: 2022-04-13

## 2022-04-13 MED ADMIN — multivitamins, therapeutic with minerals tablet 1 tablet: 1 | ORAL | @ 12:00:00 | Stop: 2022-04-13

## 2022-04-13 MED ADMIN — ** PATIENT-SUPPLIED, IN PYXIS 2 ** cenobamate 400 mg tablet: 400 mg | ORAL | @ 03:00:00

## 2022-04-13 MED ADMIN — propranoloL (INDERAL) tablet 20 mg: 20 mg | ORAL

## 2022-04-13 MED ADMIN — folic acid (FOLVITE) tablet 1 mg: 1 mg | ORAL | @ 12:00:00 | Stop: 2022-04-13

## 2022-04-13 MED ADMIN — propranoloL (INDERAL) tablet 20 mg: 20 mg | ORAL | @ 12:00:00 | Stop: 2022-04-13

## 2022-04-13 NOTE — Unmapped (Signed)
A&O x 4, all extremities 5/5 strengths. Patient was cooperative, took oral medications without issues. No complaints of pain. Vital signs within parameters. Behavior had been appropriate throughout the shift. Fall precautions maintained. Bed positioned low, bed wheels locked, side rails up x 4, bed alarm activated. No fall or injury during the shift. PNA at bedside. Will monitor.  Problem: Adult Inpatient Plan of Care  Goal: Plan of Care Review  Outcome: Progressing  Goal: Patient-Specific Goal (Individualized)  Outcome: Progressing  Goal: Absence of Hospital-Acquired Illness or Injury  Outcome: Progressing  Intervention: Identify and Manage Fall Risk  Recent Flowsheet Documentation  Taken 04/13/2022 0200 by Constance Holster, RN  Safety Interventions:  ??? aspiration precautions  ??? bed alarm  ??? elopement precautions  ??? fall reduction program maintained  ??? low bed  Taken 04/13/2022 0000 by Constance Holster, RN  Safety Interventions:  ??? aspiration precautions  ??? bed alarm  ??? elopement precautions  ??? fall reduction program maintained  ??? low bed  Taken 04/12/2022 2200 by Constance Holster, RN  Safety Interventions:  ??? aspiration precautions  ??? bed alarm  ??? elopement precautions  ??? fall reduction program maintained  ??? low bed  Taken 04/12/2022 2000 by Constance Holster, RN  Safety Interventions:  ??? aspiration precautions  ??? bed alarm  ??? elopement precautions  ??? fall reduction program maintained  ??? low bed  ??? sitter at bedside  Intervention: Prevent and Manage VTE (Venous Thromboembolism) Risk  Recent Flowsheet Documentation  Taken 04/13/2022 0200 by Constance Holster, RN  Activity Management: activity adjusted per tolerance  Taken 04/13/2022 0000 by Constance Holster, RN  Activity Management: activity adjusted per tolerance  Taken 04/12/2022 2200 by Constance Holster, RN  Activity Management: activity adjusted per tolerance  Taken 04/12/2022 2000 by Constance Holster, RN  Activity Management: activity adjusted per tolerance  Goal: Optimal Comfort and Wellbeing  Outcome: Progressing  Goal: Readiness for Transition of Care  Outcome: Progressing  Goal: Rounds/Family Conference  Outcome: Progressing     Problem: Seizure Disorder Comorbidity  Goal: Maintenance of Seizure Control  Outcome: Progressing     Problem: Self-Care Deficit  Goal: Improved Ability to Complete Activities of Daily Living  Outcome: Progressing     Problem: VTE (Venous Thromboembolism)  Goal: VTE (Venous Thromboembolism) Symptom Resolution  Outcome: Progressing  Intervention: Prevent or Manage VTE (Venous Thromboembolism)  Recent Flowsheet Documentation  Taken 04/13/2022 0200 by Constance Holster, RN  Anti-Embolism Device Type: SCD, Knee  Anti-Embolism Intervention: Refused  Anti-Embolism Device Location: BLE  Taken 04/13/2022 0000 by Constance Holster, RN  Anti-Embolism Device Type: SCD, Knee  Anti-Embolism Intervention: Refused  Anti-Embolism Device Location: BLE  Taken 04/12/2022 2200 by Constance Holster, RN  Anti-Embolism Device Type: SCD, Knee  Anti-Embolism Intervention: Refused  Anti-Embolism Device Location: BLE  Taken 04/12/2022 2000 by Constance Holster, RN  Anti-Embolism Device Type: SCD, Knee  Anti-Embolism Intervention: Refused  Anti-Embolism Device Location: BLE     Problem: Seizure, Active Management  Goal: Absence of Seizure/Seizure-Related Injury  Outcome: Progressing     Problem: Adjustment to Illness (Delirium)  Goal: Optimal Coping  Outcome: Progressing     Problem: Altered Behavior (Delirium)  Goal: Improved Behavioral Control  Outcome: Progressing     Problem: Attention and Thought Clarity Impairment (Delirium)  Goal: Improved Attention and Thought Clarity  Outcome: Progressing     Problem: Sleep Disturbance (Delirium)  Goal: Improved Sleep  Outcome: Progressing  Problem: Fall Injury Risk  Goal: Absence of Fall and Fall-Related Injury  Outcome: Progressing  Intervention: Promote Scientist, clinical (histocompatibility and immunogenetics) Documentation  Taken 04/13/2022 0200 by Constance Holster, RN  Safety Interventions:  ??? aspiration precautions  ??? bed alarm  ??? elopement precautions  ??? fall reduction program maintained  ??? low bed  Taken 04/13/2022 0000 by Constance Holster, RN  Safety Interventions:  ??? aspiration precautions  ??? bed alarm  ??? elopement precautions  ??? fall reduction program maintained  ??? low bed  Taken 04/12/2022 2200 by Constance Holster, RN  Safety Interventions:  ??? aspiration precautions  ??? bed alarm  ??? elopement precautions  ??? fall reduction program maintained  ??? low bed  Taken 04/12/2022 2000 by Constance Holster, RN  Safety Interventions:  ??? aspiration precautions  ??? bed alarm  ??? elopement precautions  ??? fall reduction program maintained  ??? low bed  ??? sitter at bedside     Problem: Skin Injury Risk Increased  Goal: Skin Health and Integrity  Outcome: Progressing  Intervention: Optimize Skin Protection  Recent Flowsheet Documentation  Taken 04/13/2022 0200 by Constance Holster, RN  Head of Bed [facility]) Positioning: HOB at 20-30 degrees  Taken 04/13/2022 0000 by Constance Holster, RN  Head of Bed [facility]) Positioning: HOB at 20-30 degrees  Taken 04/12/2022 2200 by Constance Holster, RN  Head of Bed [facility]) Positioning: HOB at 20-30 degrees  Taken 04/12/2022 2000 by Constance Holster, RN  Head of Bed [facility]) Positioning: HOB at 20-30 degrees

## 2022-04-13 NOTE — Unmapped (Signed)
Inpatient Tobacco Cessation Counseling Note    This medical encounter was conducted virtually using [email or web address]  TeleHealth protocols.    I have identified myself to the patient and conveyed my credentials to Austin Banks  I have explained the capabilities and limitations of telemedicine and the patient/proxy and myself both agree that it is appropriate for their current circumstances/symptoms.     Contact Information  Person Contacted: Austin Banks Phone number: [phone]      Phone Outcome: Spoke with pt  Is there someone else in the room? No.     Patient's location at the time of the telephone visit: Hospitalized at [facility]   Provider's location at the time of the telephone visit: At home, in [place]      Purpose of contact:     Pt participated in a telephone visit for tobacco cessation counseling.  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 Patient consented to telephone visit given due to social isolation measures in place due to the COVID-19 pandemic.     Tobacco Use History and Assessment  Time Since Last Tobacco Use: more than 1 month ago to 1 year ago  Tobacco Withdrawal (Past 24 Hours): None noted  Type of Tobacco Products Used: Cigarettes  Quantity Used: 20  Quantity Per: day  Other Household Members Use Tobacco: Prevalent in social network  Smoking Allowed in Home: No  Age Began Use (Years Old): 15  Medications Used in Past Attempts: Nicotine Patch, Nicotine Gum  Side Effects: pathc/gum: upset stomach  Previously seen by NDP?: Hospital Inpatient    Behavioral Assessment  Why Uses: 1. habit 2. stress-relief 3. enjoys it  Reasons to Become Tobacco Free: health  Barriers/Challenges: 1. pt not interested in quitting 2. long-standing habit 3. stress 4. pt does not wish to use NRT  Strategies: 1. pt can use hand to mouth replacements/spacing strategies to reduce use 2. pt can engage in other activities he enjoys once home    NOTE: Pt reports smoking 1ppd and states that despite not smoking for the past month while here, he is looking forward to having a cigarette when he leaves. He denies cravings but states he would smoke a cigarette now if he could. We discussed the benefits of using this extended hospitalization as a head start and opportunity to reduce use. Pt receptive to that and will consider it. He declines NRT due to previous experience. We reviewed strategies to help him manage future urges and triggers. SW encouraged pt to consider the implications of smoking in context of his seizure disorder and consider making changes to his smoking routine. SW provided pt with contact information, physical improvements related to tobacco cessation, and available resources (including outpatient Tobacco Treatment Program at [facility] and [facility]).    Treatment Plan  Please see below for medication recommendations in bold.   Cessation Meds Currently Using: None  Medications Recommended During Hospitalization: Patient declines  Outpatient/Discharge Medications Recommended: Patient declines  Patient's Plan Post Discharge/Visit: Does not plan to quit in next 6 months      As part of this Telephone Visit, no in-person exam was conducted.     I personally spent 12 minutes counseling the patient via telephone about tobacco cessation.  I spent an additional 9  minutes on pre- and post-visit activities.      The patient was physically located in [place] or a state in which I am permitted to provide care. The patient and/or parent/guardian understood that s/he may incur co-pays and cost sharing, and agreed to the telemedicine visit. The visit was reasonable and appropriate under the circumstances given the patient's presentation at the time.     The patient and/or parent/guardian has been advised of the potential risks and limitations of this mode of treatment (including, but not limited to, the absence of in-person examination) and has agreed to be treated using telemedicine. The patient's/patient's family's questions regarding telemedicine have been answered.      If the visit was completed in an ambulatory setting, the patient and/or parent/guardian has also been advised to contact their provider???s office for worsening conditions, and seek emergency medical treatment and/or call 911 if the patient deems either necessary.     Visit Format/Coding: Telephone     Coding: 29562 (11-20 minutes)  Service rendered over the phone most consistent with: Tobacco cessation counseling, greater than 10 minutes (13086)     Sara Chu, LCSW, LCAS, NCTTP  Clinical Social Worker / Tobacco Treatment Specialist  Tobacco Treatment Program  [facility]  phone: [phone]  pager: [phone]

## 2022-04-13 NOTE — Unmapped (Signed)
Follow-up Appointment Request  Provider: Dr. Johnnye Lana  Reason/Diagnosis: GAD65 c/b focal seizures  Priority level: Priority 2

## 2022-04-13 NOTE — Unmapped (Signed)
DC Summary, prescriptions, new medications, follow up plan, and emergency contact information was read and explained to the family, and patient at bedside.  All questions were answered.  All belongings were gathered and sent with patient.  PIV removed.  Patient left via wheel chair with no apparent distress with family member driving.    Problem: Adult Inpatient Plan of Care  Goal: Plan of Care Review  Outcome: Resolved  Goal: Patient-Specific Goal (Individualized)  Outcome: Resolved  Goal: Absence of Hospital-Acquired Illness or Injury  Outcome: Resolved  Intervention: Identify and Manage Fall Risk  Recent Flowsheet Documentation  Taken 04/13/2022 0800 by Wende Bushy, RN  Safety Interventions: low bed  Goal: Optimal Comfort and Wellbeing  Outcome: Resolved  Goal: Readiness for Transition of Care  Outcome: Resolved  Goal: Rounds/Family Conference  Outcome: Resolved     Problem: Seizure Disorder Comorbidity  Goal: Maintenance of Seizure Control  Outcome: Resolved     Problem: Self-Care Deficit  Goal: Improved Ability to Complete Activities of Daily Living  Outcome: Resolved     Problem: VTE (Venous Thromboembolism)  Goal: VTE (Venous Thromboembolism) Symptom Resolution  Outcome: Resolved  Intervention: Prevent or Manage VTE (Venous Thromboembolism)  Recent Flowsheet Documentation  Taken 04/13/2022 1200 by Wende Bushy, RN  Anti-Embolism Intervention: Refused  Taken 04/13/2022 1000 by Wende Bushy, RN  Anti-Embolism Intervention: Refused  Taken 04/13/2022 0800 by Wende Bushy, RN  Anti-Embolism Intervention: Refused  Taken 04/13/2022 0715 by Wende Bushy, RN  Anti-Embolism Intervention: Refused     Problem: Seizure, Active Management  Goal: Absence of Seizure/Seizure-Related Injury  Outcome: Resolved     Problem: Adjustment to Illness (Delirium)  Goal: Optimal Coping  Outcome: Resolved     Problem: Altered Behavior (Delirium)  Goal: Improved Behavioral Control  Outcome: Resolved     Problem: Attention and Thought Clarity Impairment (Delirium)  Goal: Improved Attention and Thought Clarity  Outcome: Resolved     Problem: Sleep Disturbance (Delirium)  Goal: Improved Sleep  Outcome: Resolved     Problem: Fall Injury Risk  Goal: Absence of Fall and Fall-Related Injury  Outcome: Resolved  Intervention: Promote Injury-Free Environment  Recent Flowsheet Documentation  Taken 04/13/2022 0800 by Wende Bushy, RN  Safety Interventions: low bed     Problem: Skin Injury Risk Increased  Goal: Skin Health and Integrity  Outcome: Resolved  Intervention: Optimize Skin Protection  Recent Flowsheet Documentation  Taken 04/13/2022 0800 by Wende Bushy, RN  Head of Bed [facility]) Positioning: HOB at 30 degrees
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
## 2022-04-14 MED ORDER — DIVALPROEX ER 500 MG TABLET,EXTENDED RELEASE 24 HR
ORAL_TABLET | Freq: Every day | ORAL | 0 refills | 30.00000 days | Status: CN
Start: 2022-04-14 — End: 2022-05-14

## 2022-04-14 MED ORDER — FOLIC ACID 1 MG TABLET
ORAL_TABLET | Freq: Every day | ORAL | 0 refills | 30.00000 days | Status: CP
Start: 2022-04-14 — End: 2022-05-14

## 2022-04-14 MED ORDER — MULTIVITAMIN-IRON 9 MG-FOLIC ACID 400 MCG-CALCIUM AND MINERALS TABLET
ORAL_TABLET | Freq: Every day | ORAL | 0 refills | 30.00000 days | Status: CP
Start: 2022-04-14 — End: ?

## 2022-04-14 MED ORDER — THIAMINE MONONITRATE (VITAMIN B1) 100 MG TABLET
ORAL_TABLET | Freq: Every day | ORAL | 0 refills | 30.00000 days | Status: CP
Start: 2022-04-14 — End: 2022-05-14
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
## 2022-04-15 NOTE — Unmapped (Signed)
Follow-up Appointment Request  Provider: Dr. Johnnye Lana or member of Neuroimmunological staff    Reason/Diagnosis: GAD-65 illness   Priority level: Priority 2

## 2022-04-17 NOTE — Unmapped (Signed)
Specialty Medication(s): Xcopri 200mg     Austin Banks has been dis-enrolled from the [facility] specialty pharmacy services due to a pharmacy change. The patient is now filling at [facility] .    Additional information provided to the patient: Informed father, Austin Banks, that if anything changes they can always reach out to [place] to re-enroll [name].     Teofilo Pod  [facility] Specialty Pharmacist

## 2022-04-20 ENCOUNTER — Ambulatory Visit
Admit: 2022-04-20 | Discharge: 2022-04-21 | Payer: PRIVATE HEALTH INSURANCE | Attending: Physician Assistant | Primary: Physician Assistant

## 2022-04-20 DIAGNOSIS — R768 Other specified abnormal immunological findings in serum: Principal | ICD-10-CM

## 2022-05-01 DIAGNOSIS — G0481 Other encephalitis and encephalomyelitis: Principal | ICD-10-CM

## 2022-05-01 DIAGNOSIS — Z9221 Personal history of antineoplastic chemotherapy: Principal | ICD-10-CM

## 2022-05-01 MED ORDER — MESNA 400 MG TABLET
ORAL_TABLET | 4 refills | 0 days | Status: CP
Start: 2022-05-01 — End: ?

## 2022-05-11 MED ORDER — LORAZEPAM 1 MG TABLET
ORAL_TABLET | Freq: Two times a day (BID) | ORAL | 0 refills | 30 days | Status: CP
Start: 2022-05-11 — End: 2022-06-10

## 2022-05-11 MED ORDER — PROPRANOLOL 20 MG TABLET
ORAL_TABLET | Freq: Two times a day (BID) | ORAL | 5 refills | 30 days | Status: CP
Start: 2022-05-11 — End: ?

## 2022-05-11 MED ORDER — THIAMINE HCL (VITAMIN B1) 100 MG TABLET
ORAL_TABLET | Freq: Every day | ORAL | 2 refills | 30 days | Status: CP
Start: 2022-05-11 — End: ?

## 2022-05-11 MED ORDER — FOLIC ACID 1 MG TABLET
ORAL_TABLET | Freq: Every day | ORAL | 5 refills | 30 days | Status: CP
Start: 2022-05-11 — End: ?

## 2022-05-13 MED ORDER — HALOPERIDOL 5 MG TABLET
ORAL_TABLET | Freq: Two times a day (BID) | ORAL | 0 refills | 30 days | Status: CP
Start: 2022-05-13 — End: 2022-06-12

## 2022-05-14 MED ORDER — LEVOCARNITINE 500 MG TABLET
ORAL_TABLET | Freq: Two times a day (BID) | ORAL | 11 refills | 0 days | Status: CP
Start: 2022-05-14 — End: ?

## 2022-05-17 DIAGNOSIS — G0481 Other encephalitis and encephalomyelitis: Principal | ICD-10-CM

## 2022-05-18 DIAGNOSIS — G0481 Other encephalitis and encephalomyelitis: Principal | ICD-10-CM

## 2022-05-24 ENCOUNTER — Ambulatory Visit: Admit: 2022-05-24 | Discharge: 2022-05-25 | Payer: PRIVATE HEALTH INSURANCE

## 2022-06-15 DIAGNOSIS — G0481 Other encephalitis and encephalomyelitis: Principal | ICD-10-CM

## 2022-06-19 ENCOUNTER — Ambulatory Visit: Admit: 2022-06-19 | Discharge: 2022-06-20 | Payer: PRIVATE HEALTH INSURANCE

## 2022-06-20 ENCOUNTER — Ambulatory Visit: Admit: 2022-06-20 | Discharge: 2022-06-21 | Payer: PRIVATE HEALTH INSURANCE

## 2022-06-20 MED ORDER — CLONAZEPAM 1 MG TABLET
ORAL_TABLET | 5 refills | 0 days | Status: CP
Start: 2022-06-20 — End: ?

## 2022-08-01 ENCOUNTER — Ambulatory Visit: Admit: 2022-08-01 | Discharge: 2022-08-02 | Payer: PRIVATE HEALTH INSURANCE | Attending: Neurology | Primary: Neurology

## 2022-08-01 DIAGNOSIS — Z5181 Encounter for therapeutic drug level monitoring: Principal | ICD-10-CM

## 2022-08-21 ENCOUNTER — Ambulatory Visit: Admit: 2022-08-21 | Discharge: 2022-08-22 | Payer: PRIVATE HEALTH INSURANCE

## 2022-08-21 DIAGNOSIS — G40909 Epilepsy, unspecified, not intractable, without status epilepticus: Principal | ICD-10-CM

## 2022-08-21 DIAGNOSIS — G40219 Localization-related (focal) (partial) symptomatic epilepsy and epileptic syndromes with complex partial seizures, intractable, without status epilepticus: Principal | ICD-10-CM

## 2022-08-21 DIAGNOSIS — G0481 Other encephalitis and encephalomyelitis: Principal | ICD-10-CM

## 2022-08-21 MED ORDER — CENOBAMATE 200 MG TABLET
ORAL_TABLET | Freq: Every evening | ORAL | 3 refills | 0 days | Status: CP
Start: 2022-08-21 — End: ?

## 2022-08-21 MED ORDER — DIVALPROEX ER 500 MG TABLET,EXTENDED RELEASE 24 HR
ORAL_TABLET | ORAL | 3 refills | 30 days | Status: CP
Start: 2022-08-21 — End: ?

## 2022-08-21 MED ORDER — CLONAZEPAM 1 MG TABLET
ORAL_TABLET | 5 refills | 0 days | Status: CP
Start: 2022-08-21 — End: ?

## 2022-08-21 MED ORDER — NAYZILAM 5 MG/SPRAY (0.1 ML) NASAL SPRAY
5 refills | 0 days | Status: CP
Start: 2022-08-21 — End: ?

## 2022-08-21 MED ORDER — LAMOTRIGINE 200 MG TABLET
ORAL_TABLET | Freq: Two times a day (BID) | ORAL | 3 refills | 30 days | Status: CP
Start: 2022-08-21 — End: ?

## 2022-08-22 ENCOUNTER — Ambulatory Visit: Admit: 2022-08-22 | Discharge: 2022-08-23 | Payer: PRIVATE HEALTH INSURANCE

## 2022-09-07 DIAGNOSIS — G0481 Other encephalitis and encephalomyelitis: Principal | ICD-10-CM

## 2022-09-19 ENCOUNTER — Ambulatory Visit: Admit: 2022-09-19 | Discharge: 2022-09-20 | Payer: PRIVATE HEALTH INSURANCE

## 2022-10-12 DIAGNOSIS — G0481 Other encephalitis and encephalomyelitis: Principal | ICD-10-CM

## 2022-10-17 ENCOUNTER — Ambulatory Visit: Admit: 2022-10-17 | Discharge: 2022-10-18 | Payer: PRIVATE HEALTH INSURANCE

## 2022-10-28 ENCOUNTER — Ambulatory Visit
Admit: 2022-10-28 | Discharge: 2022-10-28 | Disposition: A | Discharge status: Home / Self Care | Payer: PRIVATE HEALTH INSURANCE

## 2022-10-28 ENCOUNTER — Emergency Department
Admit: 2022-10-28 | Discharge: 2022-10-28 | Disposition: A | Discharge status: Home / Self Care | Payer: PRIVATE HEALTH INSURANCE

## 2022-10-28 DIAGNOSIS — B348 Other viral infections of unspecified site: Principal | ICD-10-CM

## 2022-10-28 DIAGNOSIS — R569 Unspecified convulsions: Principal | ICD-10-CM

## 2022-10-28 DIAGNOSIS — W19XXXA Unspecified fall, initial encounter: Principal | ICD-10-CM

## 2022-11-08 DIAGNOSIS — G0481 Other encephalitis and encephalomyelitis: Principal | ICD-10-CM

## 2022-11-14 ENCOUNTER — Ambulatory Visit: Admit: 2022-11-14 | Discharge: 2022-11-15 | Payer: PRIVATE HEALTH INSURANCE

## 2022-11-29 ENCOUNTER — Ambulatory Visit: Admit: 2022-11-29 | Discharge: 2022-11-30 | Payer: PRIVATE HEALTH INSURANCE

## 2022-12-08 DIAGNOSIS — G0481 Other encephalitis and encephalomyelitis: Principal | ICD-10-CM

## 2022-12-10 MED ORDER — THIAMINE MONONITRATE (VITAMIN B1) 100 MG TABLET
ORAL_TABLET | Freq: Every day | ORAL | 12 refills | 0 days
Start: 2022-12-10 — End: ?

## 2022-12-11 ENCOUNTER — Ambulatory Visit: Admit: 2022-12-11 | Discharge: 2022-12-12 | Payer: PRIVATE HEALTH INSURANCE

## 2022-12-11 DIAGNOSIS — Z5181 Encounter for therapeutic drug level monitoring: Principal | ICD-10-CM

## 2022-12-11 DIAGNOSIS — G40219 Localization-related (focal) (partial) symptomatic epilepsy and epileptic syndromes with complex partial seizures, intractable, without status epilepticus: Principal | ICD-10-CM

## 2022-12-12 MED ORDER — THIAMINE MONONITRATE (VITAMIN B1) 100 MG TABLET
ORAL_TABLET | Freq: Every day | ORAL | 12 refills | 30 days | Status: CP
Start: 2022-12-12 — End: ?

## 2022-12-27 ENCOUNTER — Ambulatory Visit: Admit: 2022-12-27 | Discharge: 2022-12-28 | Payer: PRIVATE HEALTH INSURANCE

## 2023-01-17 DIAGNOSIS — G40909 Epilepsy, unspecified, not intractable, without status epilepticus: Principal | ICD-10-CM

## 2023-01-17 MED ORDER — CENOBAMATE 200 MG TABLET
ORAL_TABLET | Freq: Every day | ORAL | 5 refills | 30 days | Status: CP
Start: 2023-01-17 — End: 2023-07-23

## 2023-01-20 DIAGNOSIS — G0481 Other encephalitis and encephalomyelitis: Principal | ICD-10-CM

## 2023-01-24 ENCOUNTER — Ambulatory Visit: Admit: 2023-01-24 | Discharge: 2023-01-25 | Payer: PRIVATE HEALTH INSURANCE

## 2023-02-16 DIAGNOSIS — G0481 Other encephalitis and encephalomyelitis: Principal | ICD-10-CM

## 2023-02-18 MED ORDER — CLONAZEPAM 1 MG TABLET
ORAL_TABLET | 5 refills | 0 days | Status: CP
Start: 2023-02-18 — End: ?

## 2023-02-21 ENCOUNTER — Ambulatory Visit: Admit: 2023-02-21 | Discharge: 2023-02-22 | Payer: PRIVATE HEALTH INSURANCE

## 2023-03-16 DIAGNOSIS — G0481 Other encephalitis and encephalomyelitis: Principal | ICD-10-CM

## 2023-03-21 ENCOUNTER — Ambulatory Visit: Admit: 2023-03-21 | Discharge: 2023-03-22 | Payer: PRIVATE HEALTH INSURANCE

## 2023-03-21 MED ORDER — ONDANSETRON HCL 8 MG TABLET
ORAL_TABLET | Freq: Two times a day (BID) | ORAL | 0 refills | 15 days | Status: CP | PRN
Start: 2023-03-21 — End: ?

## 2023-04-11 ENCOUNTER — Ambulatory Visit: Admit: 2023-04-11 | Discharge: 2023-04-12 | Payer: PRIVATE HEALTH INSURANCE | Attending: Neurology | Primary: Neurology

## 2023-04-14 MED ORDER — ACTEMRA ACTPEN 162 MG/0.9 ML SUBCUTANEOUS PEN INJECTOR
SUBCUTANEOUS | 5 refills | 0 days | Status: CP
Start: 2023-04-14 — End: ?

## 2023-04-15 DIAGNOSIS — G0481 Other encephalitis and encephalomyelitis: Principal | ICD-10-CM

## 2023-04-16 ENCOUNTER — Ambulatory Visit: Admit: 2023-04-16 | Discharge: 2023-04-17 | Payer: PRIVATE HEALTH INSURANCE

## 2023-04-16 MED ORDER — ZONISAMIDE 100 MG CAPSULE
ORAL_CAPSULE | ORAL | 3 refills | 0.00000 days | Status: CP
Start: 2023-04-16 — End: 2023-04-16

## 2023-08-01 DIAGNOSIS — G40909 Epilepsy, unspecified, not intractable, without status epilepticus: Principal | ICD-10-CM

## 2023-08-01 MED ORDER — XCOPRI 200 MG TABLET
ORAL | 5 refills | 0 days | Status: CP
Start: 2023-08-01 — End: ?

## 2023-08-05 MED ORDER — ZONISAMIDE 100 MG CAPSULE
ORAL_CAPSULE | 0 refills | 0 days | Status: CP
Start: 2023-08-05 — End: ?

## 2023-08-26 MED ORDER — CLONAZEPAM 1 MG TABLET
5 refills | 0 days | Status: CP
Start: 2023-08-26 — End: ?

## 2023-09-06 ENCOUNTER — Ambulatory Visit: Admit: 2023-09-06 | Discharge: 2023-09-07 | Payer: PRIVATE HEALTH INSURANCE

## 2023-09-06 DIAGNOSIS — G40909 Epilepsy, unspecified, not intractable, without status epilepticus: Principal | ICD-10-CM

## 2023-09-06 MED ORDER — ZONISAMIDE 100 MG CAPSULE
ORAL_CAPSULE | 0 refills | 0 days | Status: CP
Start: 2023-09-06 — End: ?

## 2023-11-01 ENCOUNTER — Ambulatory Visit
Admit: 2023-11-01 | Discharge: 2023-11-02 | Payer: PRIVATE HEALTH INSURANCE | Attending: Physician Assistant | Primary: Physician Assistant

## 2023-11-01 DIAGNOSIS — G40209 Localization-related (focal) (partial) symptomatic epilepsy and epileptic syndromes with complex partial seizures, not intractable, without status epilepticus: Principal | ICD-10-CM

## 2023-11-01 MED ORDER — CLONAZEPAM 1 MG TABLET
ORAL_TABLET | 5 refills | 0 days | Status: CP
Start: 2023-11-01 — End: ?

## 2023-11-07 DIAGNOSIS — Z5181 Encounter for therapeutic drug level monitoring: Principal | ICD-10-CM

## 2023-11-12 DIAGNOSIS — G0481 Other encephalitis and encephalomyelitis: Principal | ICD-10-CM

## 2023-11-12 NOTE — Unmapped (Signed)
[facility] Specialty Medication Onboarding    Specialty Medication: Actemra  Prior Authorization: Approved   Financial Assistance: No - copay  <$25  Final Copay/Day Supply: $4 / 28    Insurance Restrictions: None     Notes to Pharmacist: None  Credit Card on File: no    The triage team has completed the benefits investigation and has determined that the patient is able to fill this medication at [facility]. Please contact the patient to complete the onboarding or follow up with the prescribing physician as needed.

## 2023-11-15 DIAGNOSIS — Z5181 Encounter for therapeutic drug level monitoring: Principal | ICD-10-CM

## 2023-11-22 NOTE — Unmapped (Unsigned)
**Incomplete - waiting on TB results    [facility]    Banks Onboarding/Medication Counseling    Mr.Aguila is a 32 y.o. 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  I am speaking to {Blank:19197::Austin Banks,Austin Banks's caregiver, ***,Austin Banks's family member, ***,***}.    Was a Nurse, learning disability used for this call? No    Verified Banks's date of birth / HIPAA.    Specialty medication(s) to be sent: Inflammatory Disorders: Actemra      Non-specialty medications/supplies to be sent: sharps?      Medications not needed at this time: n/a         Actemra?? (tocilizumab)    Medication & Administration     Dosage:  Autoimmune  encephalitis - Inject Austin contents of 1 pen (162mg ) under Austin skin every 7 days.    Lab tests required prior to treatment initiation:  Tuberculosis: {Blank single:19197::Tuberculosis screening resulted in a non-reactive Quantiferon TB Gold assay,Tuberculosis screening resulted in a non-reactive TB skin test,Tuberculosis screening not documented in Austin Banks's chart but medication prescriber has indicated they are aware and wishing to initiate treatment at this time,***}.  Absolute neutrophil count: {Blank single:19197::ANC greater than 2,000/mm3,ANC less than 2,000/mm3 but medication prescriber has indicated they are aware and wishing to initiate treatment at this [facility] not documented in Austin Banks's chart but medication prescriber has indicated they are aware and wishing to initiate treatment at this time,***}.  Platelet count: {Blank single:19197::Platelets are greater than 100,000/mm3,Platelets are less than 100,000/mm3 but medication prescriber has indicated they are aware and wishing to initiate treatment at this time,Platelet count not documented in Austin Banks's chart but medication prescriber has indicated they are aware and wishing to initiate treatment at this time,***}.  Liver function tests: {Blank single:19197::ALT and AST are less than 1.5 x ULN,ALT or AST are more than 1.5 ULN but medication prescriber has indicated they are aware and wishing to initiate treatment at this time,ALT and AST not documented in Austin Banks's chart but medication prescriber has indicated they are aware and wishing to initiate treatment at this time,***}.      Administration:     Artist all supplies needed for injection on a clean, flat working surface: medication syringe removed from packaging, alcohol swab, sharps container, etc.  Look at Austin medication label - look for correct medication, correct dose, and check Austin expiration date  Look at Austin medication - Austin liquid in Austin syringe should appear clear and colorless to pale yellow  Lay Austin syringe on a flat surface and allow it to warm up to room temperature for at least 30 minutes  Select injection site - you can use Austin front of your thigh or your belly (but not Austin area 2 inches around your belly button); if someone else is giving you Austin injection you can also use your upper arm in Austin skin covering your triceps muscle  Prepare injection site - wash your hands and clean Austin skin at Austin injection site with an alcohol swab and let it air dry, do not touch Austin injection site again before Austin injection  Pull off Austin needle safety cap, do not remove until immediately prior to injection  Pinch Austin skin - with your hand not holding Austin syringe pinch up a fold of skin at Austin injection site using your forefinger and thumb  Insert Austin needle into Austin fold of skin at about a 45 degree  angle - it's best to use a quick dart-like motion - with Austin syringe in position, release Austin pinch of skin and allow Austin skin to relax  Push Austin plunger down slowly as far as it will go until Austin syringe is empty, if Austin plunger is not fully depressed Austin needle shield will not extend to cover Austin needle when it is removed  Check that Austin syringe is empty and keep pressing down on Austin plunger while you pull Austin needle out at Austin same angle as inserted; after Austin needle is removed completely from Austin skin, release Austin plunger allowing Austin needle shield to activate and cover Austin used needle  Dispose of Austin used syringe immediately in your sharps disposal container  If you see any blood at Austin injection site, press a cotton ball or gauze on Austin site and maintain pressure until Austin bleeding stops, do not rub Austin injection site      Prefilled auto-injector pen  Gather all supplies needed for injection on a clean, flat working surface: medication pen removed from packaging, alcohol swab, sharps container, etc.  Look at Austin medication label - look for correct medication, correct dose, and check Austin expiration date  Look at Austin medication - Austin liquid visible in Austin window on Austin side of Austin pen device should appear clear and colorless to pale yellow  Lay Austin auto-injector pen on a flat surface and allow it to warm up to room temperature for at least 45 minutes  Select injection site - you can use Austin front of your thigh or your belly (but not Austin area 2 inches around your belly button); if someone else is giving you Austin injection you can also use your upper arm in Austin skin covering your triceps muscle  Prepare injection site - wash your hands and clean Austin skin at Austin injection site with an alcohol swab and let it air dry, do not touch Austin injection site again before Austin injection  Twist and pull off Austin green safety cap, do not remove until immediately prior to injection and do not touch Austin needle shield  Pinch Austin skin - with your hand not holding Austin auto-injector pinch up a fold of skin at Austin injection site using your forefinger and thumb to prepare a firm injection site  Put Austin needle shield against your skin at Austin injection site at a 90 degree angle, hold Austin pen such that you can see Austin clear medication window  To initiate Austin injection unlock Austin green activation button by pressing Austin needle shield completely in against Austin injection site, then depress Austin green activation button to start Austin injection - you will hear a click sound  Continue to press Austin green activation button and hold Austin pen firmly against your skin for about 10 seconds - Austin window will start to turn solid purple  There will be a second click sound when Austin injection is complete, verify Austin window is solid purple before pulling Austin pen away from your skin  Dispose of Austin used auto-injector pen immediately in your sharps disposal container Austin needle will be covered automatically  If you see any blood at Austin injection site, press a cotton ball or gauze on Austin site and maintain pressure until Austin bleeding stops, do not rub Austin injection site      Adherence/Missed dose instructions:  If your injection is given more than 2 days after your scheduled injection date - consult your pharmacist for additional  instructions on how to adjust your dosing schedule.      Goals of Therapy     Giant cell arteritis  Reduction or normalization of ESR and CRP  Prevention of ischemic manifestations  Preservation of vision  Maintenance of function  Maintenance of effective psychosocial functioning    Polyarticular juvenile idiopathic arthritis  Assuring normal growth and development  Avoidance of long-term systemic glucocorticoid use  Protection of remaining articular structures  Maintenance of function  Maintenance of effective psychosocial functioning    Systemic juvenile idiopathic arthritis  Assuring normal growth and development  Avoidance of long-term systemic glucocorticoid use  Protection of remaining articular structures  Maintenance of function  Maintenance of effective psychosocial functioning    Rheumatoid arthritis  Achieve low disease activity or remission  Slow disease progression  Protection of remaining articular structures  Maintenance of function  Maintenance of effective psychosocial functioning      Side Effects & Monitoring Parameters     Injection site reaction (redness, irritation, inflammation localized to Austin site of administration)  Signs of a common cold - minor sore throat, runny or stuffy nose, etc.  Headache    Austin following side effects should be reported to Austin provider:  Signs of a hypersensitivity reaction - rash; hives; itching; red, swollen, blistered, or peeling skin; wheezing; tightness in Austin chest or throat; difficulty breathing, swallowing, or talking; swelling of Austin mouth, face, lips, tongue, or throat; etc.  Reduced immune function - report signs of infection such as fever; chills; body aches; very bad sore throat; ear or sinus pain; cough; more sputum or change in color of sputum; pain with passing urine; wound that will not heal, etc.  Also at a slightly higher risk of some malignancies (mainly skin and blood cancers) due to this reduced immune function.  In Austin case of signs of infection - Austin Banks should hold Austin next dose of Actemra?? and call your primary care provider to ensure adequate medical care.  Treatment may be resumed when infection is treated and Banks is asymptomatic.  Signs of unexplained bruising or bleeding - throwing up blood or emesis that looks like coffee grounds; black, tarry, or bloody stool; etc.  Changes in skin - a new growth or lump that forms; changes in shape, size, or color of a previous mole or marking  Shortness of breath  Chest pain/pressure      Warnings, Precautions, & Contraindications     Have your bloodwork checked as you have been told by your prescriber (ANC, platelets, lipids, etc.)  Patients with concomitant IBD or history of diverticulitis are at an increased risk for GI perforations.  Birth control pills and other hormone-based birth control may not work as well to prevent pregnancy  Talk with your doctor if you are pregnant, planning to become pregnant, or breastfeeding  Discuss Austin possible need for holding your dose(s) of Actemra?? when a planned procedure is scheduled with Austin prescriber as it may delay healing/recovery timeline       Drug/Food Interactions     Medication list reviewed in Epic. Austin Banks was instructed to inform Austin care team before taking any new medications or supplements. {Blank single:19197::No drug interactions identified,***}.   Talk with you prescriber or pharmacist before receiving any live vaccinations while taking this medication and after you stop taking it    Storage, Handling Precautions, & Disposal     Store this medication in Austin refrigerator.  Do not freeze  If needed, you may  store at room temperature for up to 14 days  Store in original packaging, protected from light  Do not shake  Dispose of used syringes/pens in a sharps disposal container          Current Medications (including OTC/herbals), Comorbidities and Allergies     Current Outpatient Medications   Medication Sig Dispense Refill    ascorbic acid, vitamin C, (VITAMIN C) 500 MG tablet Take 1 tablet (500 mg total) by mouth daily.      cetirizine (ZYRTEC) 10 MG tablet Take 1 tablet (10 mg total) by mouth every morning.      clonazePAM (KLONOPIN) 1 MG tablet Take 1 tab by mouth in am and 2 tabs in pm.  May take additional tabs after a big seizure or 2 small one.  No more than 4 tabs a day 120 tablet 5    divalproex ER (DEPAKOTE ER) 500 MG extended released 24 hr tablet Take 1 tablet (500 mg total) by mouth every morning AND 2 tablets (1,000 mg total) at bedtime. 90 tablet 3    folic acid (FOLVITE) 1 MG tablet Take 1 tablet (1 mg total) by mouth daily. 30 tablet 5    folic acid (FOLVITE) 1 MG tablet Take 1 tablet (1,000 mcg total) by mouth daily.      haloperidoL (HALDOL) 5 MG tablet Take 1 tablet (5 mg total) by mouth Two (2) times a day. 60 tablet 0    lamoTRIgine (LAMICTAL) 200 MG tablet Take 1 tablet (200 mg total) by mouth Two (2) times a day. 60 tablet 3    levOCARNitine 500 mg Tab Take 1 tablet (500 mg) by mouth Two (2) times a day. 60 tablet 11    midazolam (NAYZILAM) 5 mg/spray (0.1 mL) Spry 1 spray (5 mg) into 1 nostril for a seizure 3 minutes long; May give 2nd dose after 10 minutes for recurrent seizures. do not give a second dose if Austin Banks has trouble breathing, MAX- 2 sprays in 72hrs and 4 sprays a week. 4 each 5    multivit-min-iron fum-folic ac 7.5 mg iron-400 mcg Tab Take 1 tablet by mouth daily.      ondansetron (ZOFRAN) 8 MG tablet Take 1 tablet (8 mg total) by mouth every twelve (12) hours as needed for nausea. 30 tablet 0    SYMBICORT 160-4.5 mcg/actuation inhaler Inhale 2 puffs two (2) times a day. (Banks not taking: Reported on 11/01/2023)      tocilizumab (ACTEMRA ACTPEN) 162 mg/0.9 mL PnIj Inject Austin contents of 1 pen (162 mg) under Austin skin every seven (7) days. (Banks not taking: Reported on 11/01/2023) 3.6 mL 5    XCOPRI tablet TAKE TWO TABLETS BY MOUTH EVERY DAY 60 tablet 5     No current facility-administered medications for this visit.       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    Appropriateness of Therapy     Acute infections noted within Epic:  No active infections  Banks reported infection: {Blank single:19197::None,***- Banks reported to provider,***- pharmacy reported to provider}    Is Austin medication and dose appropriate based on diagnosis, medication list, comorbidities, allergies, medical history, Banks???s ability to self-administer Austin medication, and therapeutic goals? {Blank single:19197::Yes,No - evidence provided by prescriber in *** note}    Prescription has been clinically reviewed: {Blank single:19197::Yes,***}      Baseline Quality of Life Assessment      {DiseaseSpecificQOL:73897}    Financial Information     Medication Assistance provided: {sscmedassist:65303}    Anticipated copay of $*** reviewed with Banks. Verified delivery address.    Delivery Information     Scheduled delivery date: ***    Expected start date: ***      Medication will be delivered via {Blank:19197::UPS,Next Day Courier,Same Day Courier,Clinic Courier - *** clinic,***} to Austin {Blank:19197::prescription,temporary} address in Epic WAM.  This shipment {Blank single:19197::will,will not} require a signature.      Explained Austin services we provide at [facility] and that each month we would call to set up refills.  Stressed importance of returning phone calls so that we could ensure they receive their medications in time each month.  Informed Banks that we should be setting up refills 7-10 days prior to when they will run out of medication.  A pharmacist will reach out to perform a clinical assessment periodically.  Informed Banks that a welcome packet, containing information about our pharmacy and other support services, a Notice of Privacy Practices, and a drug information handout will be sent.      Austin Banks or caregiver noted above participated in Austin development of this care plan and knows that they can request review of or adjustments to Austin care plan at any time.      Banks or caregiver verbalized understanding of Austin above information as well as how to contact [place] pharmacy at [phone] option 4 with any questions/concerns.  [place] pharmacy is open Monday through Friday 8:30am-4:30pm.  A pharmacist is available 24/7 via pager to answer any clinical questions they may have.    Banks Specific Needs     Does Austin Banks have any physical, cognitive, or cultural barriers? {Blank single:19197::No,Yes - ***}    Does Austin Banks have adequate living arrangements? (i.e. Austin ability to store and take their medication appropriately) {Blank single:19197::Yes,No - ***}    Did you identify any home environmental safety or security hazards? {Blank single:19197::No,Yes - ***}    Banks prefers to have medications discussed with  {Blank single:19197::Banks,Family Member,Caregiver,Other}     Is Austin Banks or caregiver able to read and understand education materials at a high school level or above? {Blank single:19197::No,Yes}    Banks's primary language is  {Blank single:19197::English,Spanish,***}     Is Austin Banks high risk? {  sschighriskpts:78327}    SOCIAL DETERMINANTS OF HEALTH     At [facility], we have learned that life circumstances - like trouble affording food, housing, utilities, or transportation can affect Austin health of many of our patients.   That is why we wanted to ask: are you currently experiencing any life circumstances that are negatively impacting your health and/or quality of life? {YES/NO/PATIENTDECLINED:93004}    Social Drivers of Health     Food Insecurity: Food Insecurity Present (10/09/2023)    Received from [facility]    Hunger Vital Sign     Worried About Running Out of Food in Austin Last Year: Often true     Ran Out of Food in Austin Last Year: Sometimes true   Internet Connectivity: Not on file   Housing/Utilities: Low Risk (03/16/2022)    Housing/Utilities     Within Austin past 12 months, have you ever stayed: outside, in a car, in a tent, in an overnight shelter, or temporarily in someone else's home (i.e. couch-surfing)?: No     Are you worried about losing your housing?: No     Within Austin past 12 months, have you been unable to get utilities (heat, electricity) when it was really needed?: No   Tobacco Use: High Risk (10/09/2023)    Received from [facility]    Banks History     Smoking Tobacco Use: Every Day     Smokeless Tobacco Use: Never     Passive Exposure: Not on file   Transportation Needs: No Transportation Needs (10/09/2023)    Received from [facility] - Transportation     In Austin past 12 months, has lack of transportation kept you from medical appointments or from getting medications?: No     Lack of Transportation (Non-Medical): No   Alcohol Use: Not on file   Interpersonal Safety: Not on file   Physical Activity: Not on file   Intimate Partner Violence: Not on file   Stress: Not on file   Substance Use: Not on file (10/06/2023)   Social Connections: Not on file   Financial Resource Strain: Low Risk  (10/09/2023)    Received from [facility]    Overall Financial Resource Strain (CARDIA)     Difficulty of Paying Living Expenses: Not very hard   Depression: At risk (10/09/2023)    Received from [facility]    PHQ-2     Banks Health Questionnaire-2 Score: 4   Health Literacy: Not on file       Would you be willing to receive help with any of Austin needs that you have identified today? {Yes/No/Not applicable:93005}       Arnold Long, PharmD  [facility] Specialty Pharmacist

## 2023-11-26 ENCOUNTER — Ambulatory Visit: Admit: 2023-11-26 | Discharge: 2023-11-27 | Payer: PRIVATE HEALTH INSURANCE

## 2023-11-26 DIAGNOSIS — G40909 Epilepsy, unspecified, not intractable, without status epilepticus: Principal | ICD-10-CM

## 2023-11-26 DIAGNOSIS — G40209 Localization-related (focal) (partial) symptomatic epilepsy and epileptic syndromes with complex partial seizures, not intractable, without status epilepticus: Principal | ICD-10-CM

## 2023-11-26 DIAGNOSIS — M5431 Sciatica, right side: Principal | ICD-10-CM

## 2023-11-26 DIAGNOSIS — G0481 Other encephalitis and encephalomyelitis: Principal | ICD-10-CM

## 2023-11-26 MED ORDER — ACTEMRA ACTPEN 162 MG/0.9 ML SUBCUTANEOUS PEN INJECTOR
SUBCUTANEOUS | 5 refills | 0.00 days | Status: CP
Start: 2023-11-26 — End: ?
  Filled 2024-01-06: qty 3.6, 28d supply, fill #0

## 2023-11-26 MED ORDER — LAMOTRIGINE 200 MG TABLET
ORAL_TABLET | Freq: Two times a day (BID) | ORAL | 11 refills | 30.00 days | Status: CP
Start: 2023-11-26 — End: ?

## 2023-11-26 MED ORDER — LEVOCARNITINE 500 MG TABLET
ORAL_TABLET | Freq: Two times a day (BID) | ORAL | 11 refills | 0.00 days | Status: CP
Start: 2023-11-26 — End: ?

## 2023-11-26 MED ORDER — DIVALPROEX ER 500 MG TABLET,EXTENDED RELEASE 24 HR
ORAL_TABLET | Freq: Every evening | ORAL | 11 refills | 30.00 days | Status: CP
Start: 2023-11-26 — End: ?

## 2023-11-26 MED ORDER — CLONAZEPAM 1 MG TABLET
ORAL_TABLET | 5 refills | 0 days | Status: CP
Start: 2023-11-26 — End: 2023-11-26

## 2023-11-26 MED ORDER — CENOBAMATE 200 MG TABLET
ORAL_TABLET | Freq: Every evening | ORAL | 5 refills | 30 days | Status: CP
Start: 2023-11-26 — End: ?
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
## 2023-11-26 NOTE — Unmapped (Signed)
Seizure prevention  - Depakote ER 1500 mg qPM  Levocarnitine 500mg  twice a day  - Xcopri 400 mg PM  - Lamotrigine 200 mg BID  -  Clonazepam 1 mg in am and and 2mg  in pm. take extra dose for seizure clusters    Rescue   -  Nayzilam PRN for convulsive seizures> 3 minutes.     Please contact ACT team to re-establish care with psychiatrist and refill on Haldol    Contact physical therapy for in-home evaluation   Donnetta Hail FNP  [facility] Neurology Department  Epilepsy Division  [address], [address]  [place]  [place] [address]  Clinic Main line: [phone].   Clinic Fax number: [phone]

## 2023-12-09 ENCOUNTER — Emergency Department: Admit: 2023-12-09 | Discharge: 2023-12-10 | Discharge status: Against Medical Advice | Payer: PRIVATE HEALTH INSURANCE

## 2023-12-09 LAB — CBC W/ AUTO DIFF
BASOPHILS ABSOLUTE COUNT: 0 10*9/L (ref 0.0–0.1)
BASOPHILS RELATIVE PERCENT: 0.4 %
EOSINOPHILS ABSOLUTE COUNT: 0.1 10*9/L (ref 0.0–0.5)
EOSINOPHILS RELATIVE PERCENT: 1.1 %
HEMATOCRIT: 47.4 % (ref 39.0–48.0)
HEMOGLOBIN: 16.2 g/dL (ref 12.9–16.5)
LYMPHOCYTES ABSOLUTE COUNT: 1.5 10*9/L (ref 1.1–3.6)
LYMPHOCYTES RELATIVE PERCENT: 30.4 %
MEAN CORPUSCULAR HEMOGLOBIN CONC: 34.1 g/dL (ref 32.0–36.0)
MEAN CORPUSCULAR HEMOGLOBIN: 32.1 pg (ref 25.9–32.4)
MEAN CORPUSCULAR VOLUME: 94.1 fL (ref 77.6–95.7)
MEAN PLATELET VOLUME: 7.9 fL (ref 6.8–10.7)
MONOCYTES ABSOLUTE COUNT: 1 10*9/L — ABNORMAL HIGH (ref 0.3–0.8)
MONOCYTES RELATIVE PERCENT: 21.1 %
NEUTROPHILS ABSOLUTE COUNT: 2.2 10*9/L (ref 1.8–7.8)
NEUTROPHILS RELATIVE PERCENT: 47 %
PLATELET COUNT: 180 10*9/L (ref 150–450)
RED BLOOD CELL COUNT: 5.04 10*12/L (ref 4.26–5.60)
RED CELL DISTRIBUTION WIDTH: 12.9 % (ref 12.2–15.2)
WBC ADJUSTED: 4.8 10*9/L (ref 3.6–11.2)

## 2023-12-09 LAB — COMPREHENSIVE METABOLIC PANEL
ALBUMIN: 3.8 g/dL (ref 3.4–5.0)
ALKALINE PHOSPHATASE: 61 U/L (ref 46–116)
ALT (SGPT): 17 U/L (ref 10–49)
ANION GAP: 15 mmol/L — ABNORMAL HIGH (ref 5–14)
AST (SGOT): 25 U/L (ref <=34)
BILIRUBIN TOTAL: 0.3 mg/dL (ref 0.3–1.2)
BLOOD UREA NITROGEN: 7 mg/dL — ABNORMAL LOW (ref 9–23)
BUN / CREAT RATIO: 9
CALCIUM: 9.9 mg/dL (ref 8.7–10.4)
CHLORIDE: 100 mmol/L (ref 98–107)
CO2: 26 mmol/L (ref 20.0–31.0)
CREATININE: 0.82 mg/dL (ref 0.73–1.18)
EGFR CKD-EPI (2021) MALE: >90 mL/min/{1.73_m2} (ref >=60)
GLUCOSE RANDOM: 100 mg/dL (ref 70–179)
POTASSIUM: 3.8 mmol/L (ref 3.4–4.8)
PROTEIN TOTAL: 7.6 g/dL (ref 5.7–8.2)
SODIUM: 141 mmol/L (ref 135–145)

## 2023-12-09 LAB — VALPROIC ACID LEVEL, TOTAL: VALPROIC ACID TOTAL: 86.8 ug/mL (ref 50.0–100.0)

## 2023-12-09 NOTE — Unmapped (Signed)
Pt c/o increased seizures. Hx of seizures.

## 2023-12-11 LAB — LAMOTRIGINE LEVEL: LAMOTRIGINE LEVEL: 6.3 ug/mL

## 2023-12-12 NOTE — Unmapped (Incomplete)
LWBS/AMA Patient Follow-Up Call    Contacted patient at [phone]) - to discuss ED visit after LWBS after triage    How are you feeling since your visit to the ED on 12/09/23? Doing pretty good, but he still has some issues, we have appointment.    Have your symptoms gotten better? is unchanged relatively    Have you sought follow-up care ? No.    Why did you choose to leave instead of seeing a provider? wait times    Is there anything else we can do for you? no

## 2023-12-13 NOTE — Unmapped (Signed)
-----   Message from Worthy Flank, CPP sent at 12/10/2023  9:43 AM EST -----  Regarding: RE: Labs  He's having seizures because he hasn't been able to start the Actemra that will treat his encephalitis. Unfortunately the ED didn't draw the labs needed to start Actemra. I'm not sure if there's a way to add this on to the sample from yesterday? If not, he needs to have these drawn asap because he will continue to have seizures if he doesn't start Actemra.    Thanks,  Judeth Cornfield  ----- Message -----  From: Purvis Sheffield, RN  Sent: 12/09/2023   4:17 PM EST  To: Worthy Flank, CPP  Subject: Labs                                               Judeth Cornfield,     It looks like the patient is in the ED. Labs were drawn this afternoon.     Thanks.   C

## 2023-12-13 NOTE — Unmapped (Signed)
Called patient to inquire about lab orders. Patient's father answering. He does usually take patient to get labs done. Explained that patient needs to get a few more labs done prior to being able to start on Actemra. He will take patient to [facility] to get labs done. Patient's father noted that patient is often in a bad mood and difficult to get to go to appointments. Advised him to take patient to get labs drawn in the next week or so on a day that patient seems more cooperative. Routed to provider.

## 2023-12-17 ENCOUNTER — Ambulatory Visit: Admit: 2023-12-17 | Discharge: 2023-12-18 | Payer: PRIVATE HEALTH INSURANCE

## 2023-12-17 DIAGNOSIS — Z5181 Encounter for therapeutic drug level monitoring: Principal | ICD-10-CM

## 2023-12-17 DIAGNOSIS — G0481 Other encephalitis and encephalomyelitis: Principal | ICD-10-CM

## 2023-12-18 LAB — HEPATITIS B CORE ANTIBODY, TOTAL: HEPATITIS B CORE TOTAL ANTIBODY: NONREACTIVE

## 2023-12-18 LAB — HEPATITIS B SURFACE ANTIGEN: HEPATITIS B SURFACE ANTIGEN: NONREACTIVE

## 2023-12-19 LAB — TB AG1: TB AG1 VALUE: 0.07

## 2023-12-19 LAB — TB AG2: TB AG2 VALUE: 0.05

## 2023-12-19 LAB — TB MITOGEN: TB MITOGEN VALUE: 10

## 2023-12-19 LAB — QUANTIFERON TB GOLD PLUS
QUANTIFERON ANTIGEN 1 MINUS NIL: 0.03 [IU]/mL
QUANTIFERON ANTIGEN 2 MINUS NIL: 0.01 [IU]/mL
QUANTIFERON MITOGEN: 9.96 [IU]/mL
QUANTIFERON TB GOLD PLUS: NEGATIVE
QUANTIFERON TB NIL VALUE: 0.04 [IU]/mL

## 2023-12-19 LAB — TB NIL: TB NIL VALUE: 0.04

## 2024-01-02 MED ORDER — EMPTY CONTAINER
3 refills | 0.00 days
Start: 2024-01-02 — End: ?
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
## 2024-01-04 ENCOUNTER — Emergency Department
Admit: 2024-01-04 | Discharge: 2024-01-04 | Disposition: A | Discharge status: Home / Self Care | Payer: PRIVATE HEALTH INSURANCE | Attending: Student in an Organized Health Care Education/Training Program

## 2024-01-04 DIAGNOSIS — R4182 Altered mental status, unspecified: Principal | ICD-10-CM

## 2024-01-04 LAB — URINALYSIS WITH MICROSCOPY
BILIRUBIN UA: NEGATIVE
BLOOD UA: NEGATIVE
GLUCOSE UA: NEGATIVE
NITRITE UA: NEGATIVE
PH UA: 6.5 (ref 5.0–9.0)
RBC UA: 1 /HPF (ref <=3)
SPECIFIC GRAVITY UA: 1.034 — ABNORMAL HIGH (ref 1.003–1.030)
SQUAMOUS EPITHELIAL: <1 /HPF (ref 0–5)
UROBILINOGEN UA: 2 — AB
WBC UA: 9 /HPF — ABNORMAL HIGH (ref <=2)

## 2024-01-04 LAB — COMPREHENSIVE METABOLIC PANEL
ALBUMIN: 2.9 g/dL — ABNORMAL LOW (ref 3.4–5.0)
ALKALINE PHOSPHATASE: 51 U/L (ref 46–116)
ALT (SGPT): 7 U/L — ABNORMAL LOW (ref 10–49)
ANION GAP: 12 mmol/L (ref 5–14)
AST (SGOT): 11 U/L (ref <=34)
BILIRUBIN TOTAL: 0.2 mg/dL — ABNORMAL LOW (ref 0.3–1.2)
BLOOD UREA NITROGEN: 6 mg/dL — ABNORMAL LOW (ref 9–23)
BUN / CREAT RATIO: 9
CALCIUM: 8.8 mg/dL (ref 8.7–10.4)
CHLORIDE: 109 mmol/L — ABNORMAL HIGH (ref 98–107)
CO2: 25 mmol/L (ref 20.0–31.0)
CREATININE: 0.66 mg/dL — ABNORMAL LOW (ref 0.73–1.18)
EGFR CKD-EPI (2021) MALE: >90 mL/min/{1.73_m2} (ref >=60)
GLUCOSE RANDOM: 81 mg/dL (ref 70–179)
POTASSIUM: 3.4 mmol/L (ref 3.4–4.8)
PROTEIN TOTAL: 6 g/dL (ref 5.7–8.2)
SODIUM: 146 mmol/L — ABNORMAL HIGH (ref 135–145)

## 2024-01-04 LAB — CBC W/ AUTO DIFF
BASOPHILS ABSOLUTE COUNT: 0 10*9/L (ref 0.0–0.1)
BASOPHILS RELATIVE PERCENT: 0.3 %
EOSINOPHILS ABSOLUTE COUNT: 0.1 10*9/L (ref 0.0–0.5)
EOSINOPHILS RELATIVE PERCENT: 2 %
HEMATOCRIT: 39.6 % (ref 39.0–48.0)
HEMOGLOBIN: 13.1 g/dL (ref 12.9–16.5)
LYMPHOCYTES ABSOLUTE COUNT: 1.6 10*9/L (ref 1.1–3.6)
LYMPHOCYTES RELATIVE PERCENT: 28.8 %
MEAN CORPUSCULAR HEMOGLOBIN CONC: 33.2 g/dL (ref 32.0–36.0)
MEAN CORPUSCULAR HEMOGLOBIN: 31.3 pg (ref 25.9–32.4)
MEAN CORPUSCULAR VOLUME: 94.3 fL (ref 77.6–95.7)
MEAN PLATELET VOLUME: 7.1 fL (ref 6.8–10.7)
MONOCYTES ABSOLUTE COUNT: 0.7 10*9/L (ref 0.3–0.8)
MONOCYTES RELATIVE PERCENT: 12.4 %
NEUTROPHILS ABSOLUTE COUNT: 3.2 10*9/L (ref 1.8–7.8)
NEUTROPHILS RELATIVE PERCENT: 56.5 %
PLATELET COUNT: 156 10*9/L (ref 150–450)
RED BLOOD CELL COUNT: 4.2 10*12/L — ABNORMAL LOW (ref 4.26–5.60)
RED CELL DISTRIBUTION WIDTH: 13.5 % (ref 12.2–15.2)
WBC ADJUSTED: 5.7 10*9/L (ref 3.6–11.2)

## 2024-01-04 LAB — TOXICOLOGY SCREEN, URINE
AMPHETAMINE SCREEN URINE: NEGATIVE
BARBITURATE SCREEN URINE: NEGATIVE
BENZODIAZEPINE SCREEN, URINE: NEGATIVE
BUPRENORPHINE, URINE SCREEN: NEGATIVE
CANNABINOID SCREEN URINE: POSITIVE — AB
COCAINE(METAB.)SCREEN, URINE: POSITIVE — AB
FENTANYL SCREEN, URINE: NEGATIVE
METHADONE SCREEN, URINE: NEGATIVE
OPIATE SCREEN URINE: NEGATIVE
OXYCODONE SCREEN URINE: NEGATIVE

## 2024-01-04 LAB — LACTATE SEPSIS, VENOUS: LACTATE BLOOD VENOUS: 0.9 mmol/L (ref 0.5–1.8)

## 2024-01-04 LAB — VALPROIC ACID LEVEL, TOTAL: VALPROIC ACID TOTAL: 79.4 ug/mL (ref 50.0–100.0)

## 2024-01-04 NOTE — Unmapped (Signed)
Initial Consult Note        Requesting Attending Physician:  Austin Banker, MD  Service Requesting Consult: Emergency Medicine     Assessment and Plan          Austin Banks is a 33 y.o. 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    # Slurred speech, drowsiness  Patient presents after a fall with transient LOC (~ 1 minute) and +headstrike on 2/7. Since then, his family have been concerned that he has not returned to his baseline mentation and that he continues to have difficulty walking/standing. Patient has generally been adherent to his ASM regimen which his father confirmed using patient's pill box. No witnessed events concerning for patient's typical semiologies (in HPI). Additionally, patient typically has a post-ictal period of ~10 minutes according to his father, and this persistent drowsiness is not consistent with such. Patient has a history of heavy substance use and reports taking multiple substances in the past few days.     On exam, although he has slurred speech and appears drowsy, he briskly responds to questions, appropriately answers orientation questions, and can follow multistep commands. Additionally, no focal deficits on exam, though with apparent functional overlay weakness in his bilateral lower extremities that is not consistent and alternates between limbs.     Overall presentation with consideration to reassuring neuro exam, unremarkable CTH, no apparent recent seizure, and active concern for frequent substance use raises high suspicion for intoxication versus substance withdrawal. Anticipate gradual improvement with time, but recommend close monitoring for any objective signs of withdrawal.     # Unidirectional nystagmus  Incidentally noted on exam. HINTs points to a peripheral etiology. Patient endorses some diplopia but denies dizziness. No ataxia or dysmetria on exam either. Possible finding in the setting of substance or recent alcohol intoxication. No further workup indicated at this time.     Recommendations  Follow up pending ASM levels  Follow up Utox  Monitor for any signs of substance withdrawal  Consider Addiction Medicine referral/consult  Consider Social Work consult (family reports concerns with patient's current housing situation)      This patient was seen and discussed with Dr. Emmie Banks, who agrees with the above assessment and plan.      Austin Pandy, MD, MPH  Resident Physician, PGY-2         HPI        Reason for Consult: AMS and slurred speech    Austin Banks is a 33 y.o.  male on whom I have been asked by Austin Banker, MD to consult for AMS and slurred speech.     33 y.o. 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 Patient presents after a fall with transient LOC (~ 1 minute) and +headstrike on 2/7. Over the few days before this had been somewhat off baseline but according to father he is typically somewhat drowsy and has slurred speech in the setting of regular substance use. Since patient's fall, his family have been concerned that he has not returned to his baseline mentation and that he continues to have difficulty walking/standing.     Patient has generally been adherent to his ASM regimen which his father confirmed using patient's  pill box. No witnessed events concerning for patient's typical semiologies as listed below. Additionally, patient typically has a post-ictal period of ~10 minutes according to his father, and this persistent drowsiness is not consistent with such.     Past documented semiologies:  Seizure/seizure-like event  Description: focal onset with secondary generalization  - Semiology: Begins with right eye deviation, left facial clonic jerking, eye blinking, mumbling. LUE flexed with coordinated back and forth movements of his bilateral lower extremities and drool from the left side of his mouth. All movement abruptly ceases at ictal offset with pelvic thrusting. Family also describes hand automatisms after shaking at times. Post-ictal confusion lasting up to 20 minutes.   Frequency: Previously 3 times per week, now 1 every 3-4 months     Seizure/seizure-like event  Description: focal unaware  - Semiology: Behavior arrest and arrest with confusion afterwards. Attempts to remove clothes afterwards  Frequency: Previously twice per month, now unclear    Allergies   Allergen Reactions    Lacosamide Other (See Comments)     Psychosis    Hallucinations      Current Facility-Administered Medications   Medication Dose Route Frequency Provider Last Rate Last Admin    clonazePAM (KlonoPIN) disintegrating tablet 1 mg  1 mg Oral Daily Austin Found, MD        And    clonazePAM (KlonoPIN) tablet 2 mg  2 mg Oral Nightly Austin Found, MD        divalproex ER (DEPAKOTE ER) extended released 24 hr tablet 1,500 mg  1,500 mg Oral Nightly Austin Found, MD        haloperidol (HALDOL) tablet 15 mg  15 mg Oral Nightly Austin Found, MD        lamoTRIgine (LaMICtal) tablet 200 mg  200 mg Oral BID Austin Found, MD        levocarnitine (CARNITOR) (SF) oral solution  500 mg Oral BID Austin Found, MD         Current Outpatient Medications   Medication Sig Dispense Refill    cenobamate (XCOPRI) tablet Take 400 mg by mouth nightly. 60 tablet 5    cetirizine (ZYRTEC) 10 MG tablet Take 1 tablet (10 mg total) by mouth every morning.      clonazePAM (KLONOPIN) 1 MG tablet Take 1 tab by mouth in am and 2 tabs in pm.  May take additional tabs after a big seizure or 2 small one.  No more than 4 tabs a day 120 tablet 5    divalproex ER (DEPAKOTE ER) 500 MG extended released 24 hr tablet Take 3 tablets (1,500 mg total) by mouth nightly. 90 tablet 11    empty container Misc Use as directed to dispose of injections 1 each 3    folic acid (FOLVITE) 1 MG tablet Take 1 tablet (1 mg total) by mouth daily. 30 tablet 5    haloperidol (HALDOL) 5 MG tablet Take 3 tablets (15 mg total) by mouth nightly.      lamoTRIgine (LAMICTAL) 200 MG tablet Take 1 tablet (200 mg total) by mouth two (2) times a day. 60 tablet 11    levocarnitine 500 mg Tab Take 1 tablet (500 mg) by mouth Two (2) times a day. 60 tablet 11    midazolam (NAYZILAM) 5 mg/spray (0.1 mL) Spry 1 spray (5 mg) into 1 nostril for a seizure 3 minutes long; May give 2nd dose after 10 minutes for recurrent seizures. do not give a second  dose if the patient has trouble breathing, MAX- 2 sprays in 72hrs and 4 sprays a week. 4 each 5    multivit-min-iron fum-folic ac 7.5 mg iron-400 mcg Tab Take 1 tablet by mouth daily.      ondansetron (ZOFRAN) 8 MG tablet Take 1 tablet (8 mg total) by mouth every twelve (12) hours as needed for nausea. 30 tablet 0    tocilizumab (ACTEMRA ACTPEN) 162 mg/0.9 mL PnIj Inject the contents of 1 pen (162 mg) under the skin every seven (7) days. 3.6 mL 5    (Not in a hospital admission)     Past Medical History:   Diagnosis Date    Fall     from medications    Hepatitis B surface antigen positive     Schizophrenia (CMS-HCC)     Seizure (CMS-HCC)     TBI (traumatic brain injury) (CMS-HCC)     Vitamin D deficiency        Past Surgical History:   Procedure Laterality Date    MANDIBLE FRACTURE SURGERY         Social History     Socioeconomic History    Marital status: Single     Spouse name: None    Number of children: None    Years of education: None    Highest education level: None   Tobacco Use    Smoking status: Every Day     Current packs/day: 1.00     Average packs/day: 1 pack/day for 10.0 years (10.0 ttl pk-yrs)     Types: Cigarettes    Smokeless tobacco: Never    Tobacco comments:     10-20cpd   Vaping Use    Vaping status: Never Used   Substance and Sexual Activity    Alcohol use: Not Currently     Comment: rare    Drug use: Yes Frequency: 7.0 times per week     Types: Marijuana, Cocaine   Social History Narrative    Updated May 2023        PSYCHIATRIC HX:     -Prior Dx: Schizophrenia. Seizures started ~age 28 around same time of head injury (unknown mechanism), psychotic symptoms ~age 68 by chart review. Seizures intractible for many years, in 2021 was Banks to be GAD65+ in serum and CSF, has been receiving IVIG + cellcept or rituximab since then. Historically in post-ictal state has had more psychotic symptoms/agitation.     -Hospitalizations: 10+ prior including at [facility]; most recent at [facility] in 2021, involuntarily on 3NSH due to psychosis. Last known in Nov 2022 at [facility] for delusions/AH    -Suicide attempts: denies     -Non-suicidal self-injury: denies      -Medication Trials: Zyprexa (stopped due to fear of increased seizure risk, was on doses as high as 40mg ), Risperdal (and Zyprexa and risperidone together at times), Geodon, abilify, Lamictal, Invega Sustenna x 2 months and stopped at [facility]. In spring 2023 admitted to Neuro on risperidone 3mg  BID, while hospitalized changed to haldol 5mg  BID (May 2023)    -Med compliance Hx: fair     -Current treatment: ACT team through [facility]    -Past Psych: [facility], [facility] in [place]. [facility]- many group homes and different ACT/CST in past        SUBSTANCE ABUSE HX:    # Marijuana: several times a day since age 69, reported use as recently as 2022    # Cocaine: prior use, occasionally. Last reported use ~2012    #  Alcohol: Patient denies recent use. No known hx of Sz/DT/Complicated WD Hx, in 2016 drinking 2-3 drinks a day         Social Hx:    -Current Living Environment: Currently living with parents; previously at group homes; [facility] and [facility] in [place]    -Guardian: Father; Hadyn Blanck (home [phone]), cell [phone])    -Sexual orientation: openly homosexual to family from 2016, also has a child     -Education/School Performance: High school, regular classes     -Income/Employment: disability check; -dad is his payee    -Legal/School Discipline: breaking and entering, assault charges     -Abuse/Neglect/Trauma/DV: denies     -Violence (perp): previous assault charges     -Weapons Access: denies guns        FAMILY HX    -Mental Illness: denies     -Suicide: denies     -Substance Abuse: extensive history alcohol and recreational drug use     Social Drivers of Psychologist, prison and probation services Strain: Low Risk  (10/09/2023)    Received from [facility]    Overall Financial Resource Strain (CARDIA)     Difficulty of Paying Living Expenses: Not very hard   Food Insecurity: Food Insecurity Present (10/09/2023)    Received from [facility]    Hunger Vital Sign     Worried About Running Out of Food in the Last Year: Often true     Ran Out of Food in the Last Year: Sometimes true   Transportation Needs: No Transportation Needs (10/09/2023)    Received from [facility]    PRAPARE - Transportation     In the past 12 months, has lack of transportation kept you from medical appointments or from getting medications?: No     Lack of Transportation (Non-Medical): No     Family History   Problem Relation Age of Onset    Hypertension Mother     Hypertension Father     Diabetes Father     Cancer Maternal Aunt     Cancer Maternal Grandmother     Cancer Paternal Grandfather     Seizures Neg Hx        Code Status: Full Code     Review of Systems     A 10-system review of systems was conducted and was negative except as documented above in the HPI.       Objective        Temp:  [36.5 ??C (97.7 ??F)-36.8 ??C (98.2 ??F)] 36.5 ??C (97.7 ??F)  Heart Rate:  [56-80] 56  SpO2 Pulse:  [57-80] 57  Resp:  [14-19] 15  BP: (93-112)/(60-76) 101/64  MAP (mmHg):  [71-86] 76  SpO2:  [98 %-100 %] 100 %  No intake/output data recorded.    Physical Exam:  General Exam:  General Appearance:In no acute distress.   HEENT: Head is atraumatic and normocephalic  Neck: Supple  Lungs: No increased work of breathing  Heart: Warm and well perfused  Abdomen: Nondistended  Extremities: No extremity edema     Neurological Exam:  Mental Status: Drowsy but alert and oriented x4 (self, place, time). Spontaneous speech is slurred but without word finding pauses or paraphasic errors. Can name and repeat. Can identify a pen and explain its function. Comprehension was intact. Memory for recent and remote events was intact.    Cranial Nerves:   II, III- PERRL. Visual fields intact to  finger count. Double simultaneous visual stimulation shows no extinction.   III, IV, VI - Pursuit eye movements were uninterrupted with full range. Right-beating nystagmus noted on both rightward and leftward gaze. Degree of nystagmus improves on left gaze. Negative skew. Possible corrective saccades also present.   V - Facial sensation intact to light touch in all three divisions of CN V bilaterally.  VII - Face is symmetric at rest and with activation.  VIII - Hearing grossly intact to normal speech.  IX and X - Palate movement is symmetric.  XI -  Shoulder shrug full strength bilaterally.   XII - Tongue protrudes midline and tongue movements are normal.     Motor Exam: Normal bulk.  Normal tone in the upper and lower extremities.  No tremors, myoclonus, or other adventitious movement.  Pronator drift is absent.  RUE: 4+/5 throughout   LUE: 4+/5 throughout   RLE: 4+/5 throughout with inconsistent functional overlay proximally  LLE: 4+/5 throughout with inconsistent functional overlay proximally    Reflexes:    R L   Biceps +2 +2   Brachioradialis +2 +2   Triceps +2 +2   Patella +2 +2   Achilles +2 +2   Toes are downgoing bilaterally.    Sensory: Sensation normal to light touch, pinprick, and temperature sensation to cold in both hands and both feet and to vibration distally in the fingers and toes.     Cerebellar/Coordination/Gait: Finger-to-nose is normal without ataxia or dysmetria bilaterally. Heel-to-shin is normal without ataxia or dysmetria bilaterally.      Diagnostic Studies      All Labs Last 24hrs:   Recent Results (from the past 24 hours)   Comprehensive metabolic panel    Collection Time: 01/04/24 10:12 AM   Result Value Ref Range    Sodium 146 (H) 135 - 145 mmol/L    Potassium 3.4 3.4 - 4.8 mmol/L    Chloride 109 (H) 98 - 107 mmol/L    CO2 25.0 20.0 - 31.0 mmol/L    Anion Gap 12 5 - 14 mmol/L    BUN 6 (L) 9 - 23 mg/dL    Creatinine 1.61 (L) 0.73 - 1.18 mg/dL    BUN/Creatinine Ratio 9     eGFR CKD-EPI (2021) Male >90 >=60 mL/min/1.43m2    Glucose 81 70 - 179 mg/dL    Calcium 8.8 8.7 - 09.6 mg/dL    Albumin 2.9 (L) 3.4 - 5.0 g/dL    Total Protein 6.0 5.7 - 8.2 g/dL    Total Bilirubin 0.2 (L) 0.3 - 1.2 mg/dL    AST 11 <=04 U/L    ALT 7 (L) 10 - 49 U/L    Alkaline Phosphatase 51 46 - 116 U/L   Influenza/ RSV/COVID PCR    Collection Time: 01/04/24 10:12 AM    Specimen: Nasopharyngeal Swab   Result Value Ref Range    SARS-CoV-2 PCR Negative Negative    Influenza A Negative Negative    Influenza B Negative Negative    RSV Negative Negative   CBC w/ Differential    Collection Time: 01/04/24 10:12 AM   Result Value Ref Range    WBC 5.7 3.6 - 11.2 10*9/L    RBC 4.20 (L) 4.26 - 5.60 10*12/L    HGB 13.1 12.9 - 16.5 g/dL    HCT 54.0 98.1 - 19.1 %    MCV 94.3 77.6 - 95.7 fL    MCH 31.3 25.9 - 32.4 pg    MCHC 33.2 32.0 -  36.0 g/dL    RDW 78.2 95.6 - 21.3 %    MPV 7.1 6.8 - 10.7 fL    Platelet 156 150 - 450 10*9/L    Neutrophils % 56.5 %    Lymphocytes % 28.8 %    Monocytes % 12.4 %    Eosinophils % 2.0 %    Basophils % 0.3 %    Absolute Neutrophils 3.2 1.8 - 7.8 10*9/L    Absolute Lymphocytes 1.6 1.1 - 3.6 10*9/L    Absolute Monocytes 0.7 0.3 - 0.8 10*9/L    Absolute Eosinophils 0.1 0.0 - 0.5 10*9/L    Absolute Basophils 0.0 0.0 - 0.1 10*9/L   Valproic Acid Level    Collection Time: 01/04/24 10:12 AM   Result Value Ref Range    Valproic Acid, Total 79.4 50.0 - 100.0 ug/mL   Lactate Sepsis, Venous    Collection Time: 01/04/24 11:43 AM   Result Value Ref Range    Lactate, Venous 0.9 0.5 - 1.8 mmol/L   Toxicology screen, urine    Collection Time: 01/04/24  1:30 PM   Result Value Ref Range    Amphetamines Screen, Ur Negative <500 ng/mL    Barbiturates Screen, Ur Negative <200 ng/mL    Benzodiazepines Screen, Urine Negative <200 ng/mL    Cannabinoids Screen, Ur Positive (A) <20 ng/mL    Methadone Screen, Urine Negative <300 ng/mL    Cocaine(Metab.)Screen, Urine Positive (A) <150 ng/mL    Opiates Screen, Ur Negative <300 ng/mL    Fentanyl Screen, Ur Negative <1.0 ng/mL    Oxycodone Screen, Ur Negative <100 ng/mL    Buprenorphine, Urine Negative <5 ng/mL   Urinalysis with Microscopy    Collection Time: 01/04/24  1:30 PM   Result Value Ref Range    Color, UA Dark Yellow     Clarity, UA Clear     Specific Gravity, UA 1.034 (H) 1.003 - 1.030    pH, UA 6.5 5.0 - 9.0    Leukocyte Esterase, UA Trace (A) Negative    Nitrite, UA Negative Negative    Protein, UA Trace (A) Negative    Glucose, UA Negative Negative    Ketones, UA Trace (A) Negative    Urobilinogen, UA 2.0 mg/dL (A) <0.8 mg/dL    Bilirubin, UA Negative Negative    Blood, UA Negative Negative    RBC, UA 1 <=3 /HPF    WBC, UA 9 (H) <=2 /HPF    Squam Epithel, UA <1 0 - 5 /HPF    Bacteria, UA Occasional (A) None Seen /HPF    Mucus, UA Few (A) None Seen /HPF     Relevant Studies/Radiology:  CT Head w/o contrast Results for orders placed during the hospital encounter of 01/04/24  CT head WO contrast  Impression  --No acute intracranial abnormality.  --Unchanged chronic right-sided paranasal sinusitis.  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    Unchanged right paranasal sinusitis.  Signed by: Judeth Porch on 03/28/2022  4:45 PM

## 2024-01-04 NOTE — Unmapped (Signed)
[facility]  Emergency Department Provider Note      ED Clinical Impression       Diagnosis ICD-10-CM Associated Orders   1. Altered mental status, unspecified altered mental status type  R41.82                Impression, Medical Decision Making, Progress Notes and Critical Care      Impression, Differential Diagnosis and Plan of Care  Patient was evaluated for AMS which resolved during his ~8 hours in the ED. CT head without evidence of intracranial abnormality. No lab or clinical evidence of infection. Electrolytes WNL. UDS+ cocaine and marijuana. During PM exam, patient reports that his drowsiness was due to his drinking liquor. Neurology evaluated and had low suspicion for seizure based on history and exam. Patient also had been adherent to anti-epileptics in recent days and in general over the past few week (family lives nearby and observes/helps him take his medicines).    Long discussion with father and patient at bedside prior to discharge. Patient declined social work referral, addiciton medicine referral, assistance in finding group home or residential support. States he has been trying to avoid group homes. Can clearly state that consequence of further cocaine, alcohol, marijuana use is further seizures and other medical complications.    Patient is medically appropriate for discharge. He does tell me that he plans to continue to use drugs. His family and PCP have referred him to adult protective services given their concern for his inability to care for himself. He was counseled against further drug and alcohol use and repeatedly offered services during this hospitalization and referrals at discharge which declined.    Return precautions were reviewed and summarized in dc instructions.           Additional MDM Elements         Discussion with other professionals: Consultant - neurology was consulted and evaluated patient this morning, they will round with their attending and provide formal recs later today  Independent interpretation: CT scan(s) - no evidence of acute life threatening abnormality including no evidence of skull fracture, intracranial mass or bleed on my review  I have reviewed recent and relavant previous record, including: Inpatient notes - recent ED presentation for similar   Outpatient notes - outpatient anti-epileptic regimen reviewed  Escalation of Care including OBS/Admission/Transfer was considered: However, patient was determined to be appropriate for outpatient management. See progress note for additional detail.  Social Determinants that significantly affected care: Substance abuse  Prescription drugs considered but not prescribed: none (no changes to home meds)  Diagnostic tests considered but not performed: none      Portions of this record have been created using Scientist, clinical (histocompatibility and immunogenetics). Dictation errors have been sought, but may not have been identified and corrected.    See chart and nursing documentation for additional ED course details.    ____________________________________________         History        Reason for Visit  Seizure - Prior History Of      HPI   Austin Banks is a 33 y.o. male with a history of  tonic-clonic seizures, autoimmune encephalitis hx of TBI, schizophrenia, polysubstance use presenting with altered mental status in the form of drowsiness and disorientation. Patient lives alone and his parents live down the street. They are very worried about his living situation, they have observed (via cameras) people coming over and using drugs with patient. Patient endorses regular cocaine, marijuana  use and occasional alcohol use.     Patient's father reports that patient took anti-epileptics this morning and both AM and PM yesterday. Patient's parents generally monitor his adherence to medications and overall report he has been generally adherent to his medications.    Patient's father reports that yesterday 2/7 patient fell and struck his head. He was unconscious for roughly one hour, when he has seizures he is usually post-ictal only 10 minutes. Father states this fall did not resemble his typical seizures.    Patient has also had an unusual gait over at least the last several days. Reason for this is not clear; patient denies pain or injury.    Patient's family and PCP have reported him to adult protective services given his inability to care for himself. He has a IT trainer.    Outside Historian(s)  Father at bedside    Past Medical History:   Diagnosis Date    Fall     from medications    Hepatitis B surface antigen positive     Schizophrenia (CMS-HCC)     Seizure (CMS-HCC)     TBI (traumatic brain injury) (CMS-HCC)     Vitamin D deficiency        Past Surgical History:   Procedure Laterality Date    MANDIBLE FRACTURE SURGERY           Current Facility-Administered Medications:     clonazePAM (KlonoPIN) disintegrating tablet 1 mg, 1 mg, Oral, Daily **AND** clonazePAM (KlonoPIN) tablet 2 mg, 2 mg, Oral, Nightly, Micaella Gitto C, MD    divalproex ER (DEPAKOTE ER) extended released 24 hr tablet 1,500 mg, 1,500 mg, Oral, Nightly, Dinari Stgermaine C, MD    haloperidol (HALDOL) tablet 15 mg, 15 mg, Oral, Nightly, Laureano Hetzer C, MD    lamoTRIgine (LaMICtal) tablet 200 mg, 200 mg, Oral, BID, Allean Found, MD    levocarnitine (CARNITOR) (SF) oral solution, 500 mg, Oral, BID, Allean Found, MD    Current Outpatient Medications:     cenobamate (XCOPRI) tablet, Take 400 mg by mouth nightly., Disp: 60 tablet, Rfl: 5    cetirizine (ZYRTEC) 10 MG tablet, Take 1 tablet (10 mg total) by mouth every morning., Disp: , Rfl:     clonazePAM (KLONOPIN) 1 MG tablet, Take 1 tab by mouth in am and 2 tabs in pm.  May take additional tabs after a big seizure or 2 small one.  No more than 4 tabs a day, Disp: 120 tablet, Rfl: 5    divalproex ER (DEPAKOTE ER) 500 MG extended released 24 hr tablet, Take 3 tablets (1,500 mg total) by mouth nightly., Disp: 90 tablet, Rfl: 11    empty container Misc, Use as directed to dispose of injections, Disp: 1 each, Rfl: 3    folic acid (FOLVITE) 1 MG tablet, Take 1 tablet (1 mg total) by mouth daily., Disp: 30 tablet, Rfl: 5    haloperidol (HALDOL) 5 MG tablet, Take 3 tablets (15 mg total) by mouth nightly., Disp: , Rfl:     lamoTRIgine (LAMICTAL) 200 MG tablet, Take 1 tablet (200 mg total) by mouth two (2) times a day., Disp: 60 tablet, Rfl: 11    levocarnitine 500 mg Tab, Take 1 tablet (500 mg) by mouth Two (2) times a day., Disp: 60 tablet, Rfl: 11    midazolam (NAYZILAM) 5 mg/spray (0.1 mL) Spry, 1 spray (5 mg) into 1 nostril for a seizure 3 minutes long; May give 2nd dose after 10 minutes for recurrent  seizures. do not give a second dose if the patient has trouble breathing, MAX- 2 sprays in 72hrs and 4 sprays a week., Disp: 4 each, Rfl: 5    multivit-min-iron fum-folic ac 7.5 mg iron-400 mcg Tab, Take 1 tablet by mouth daily., Disp: , Rfl:     ondansetron (ZOFRAN) 8 MG tablet, Take 1 tablet (8 mg total) by mouth every twelve (12) hours as needed for nausea., Disp: 30 tablet, Rfl: 0    tocilizumab (ACTEMRA ACTPEN) 162 mg/0.9 mL PnIj, Inject the contents of 1 pen (162 mg) under the skin every seven (7) days., Disp: 3.6 mL, Rfl: 5    Allergies  Lacosamide    Family History   Problem Relation Age of Onset    Hypertension Mother     Hypertension Father     Diabetes Father     Cancer Maternal Aunt     Cancer Maternal Grandmother     Cancer Paternal Grandfather     Seizures Neg Hx        Social History     Tobacco Use    Smoking status: Every Day     Current packs/day: 1.00     Average packs/day: 1 pack/day for 10.0 years (10.0 ttl pk-yrs)     Types: Cigarettes    Smokeless tobacco: Never    Tobacco comments:     10-20cpd   Vaping Use    Vaping status: Never Used   Substance Use Topics    Alcohol use: Not Currently     Comment: rare    Drug use: Yes     Frequency: 7.0 times per week     Types: Marijuana, Cocaine          Physical Exam     ED Triage Vitals   Enc Vitals Group      BP 01/04/24 0834 110/75      Heart Rate 01/04/24 0830 66      SpO2 Pulse 01/04/24 1030 80      Resp 01/04/24 0834 14      Temp 01/04/24 0834 36.8 ??C (98.2 ??F)      Temp Source 01/04/24 0834 Oral      SpO2 01/04/24 0830 99 %      Weight --       Height --       Head Circumference --       Peak Flow --       Pain Score --       Pain Loc --       Pain Education --       Exclude from Growth Chart --      8:30 AM exam:  Constitutional: In no acute distress. Laying in bed asleep when I entered the room but easily woken. Father at bedside.  Eyes: Conjunctivae are normal.  ENT       Head: Normocephalic and atraumatic.       Nose: No congestion.       Mouth/Throat: Mucous membranes are moist.       Neck: No stridor.  Cardiovascular: Normal rate, regular rhythm. Normal and symmetric distal pulses are present in all extremities.  Respiratory: Normal respiratory effort. Breath sounds are normal.  Gastrointestinal: Soft and nontender. There is no CVA tenderness.  Musculoskeletal: Normal range of motion in all extremities.       Right lower leg: No tenderness or edema.       Left lower leg: No tenderness or edema.  Neurologic: Speech is  slow and slurred. L-beating nystagmus noted by neurology, also present on my exam. Patient reports difficulty walking, concerned about standing. No other deficits appreciated.  Skin: Skin is warm, dry and intact. No rash noted.  Psychiatric: Mood and affect are normal. Speech and behavior are normal.    4:30pm exam:  Patient awake alert and appropriately responsive. Oriented x3. Ambulates stably with short gain. When asked to take longer steps he declined, but denied pain or any particular reason for his gait. Left-beating nystagmus still present.        Radiology     CT head WO contrast    (Results Pending)     Gerald Stabs, MD  [facility] FM PGY-1     Allean Found, MD  Resident  01/04/24 [phone]

## 2024-01-04 NOTE — Unmapped (Signed)
Pt brought in by dad for seizures and falls at home. Pt has autoimmune encephalitis. Lethargic on arrival.

## 2024-01-04 NOTE — Unmapped (Signed)
Pt BIB dad who reports that pt has been 'out of it' for the past 3-4 days. He had a seizure on Thursday --hx of same. He tried to stand up yesterday, took one step and fell over -- dad states +HS +LOC for approx 1 min. Dad states that he has been very lethargic, reports hx of brain swelling. Pt normally lives alone but Dad had to take him back to stay with Dad d/t increased lethargy, unsteady gait.

## 2024-01-06 MED FILL — EMPTY CONTAINER: 120 days supply | Qty: 1 | Fill #0

## 2024-01-07 NOTE — Unmapped (Signed)
Neurology Clinical Pharmacist Telephone Call    Medication: Actemra     Called patient's mother to confirm whether patient would like to move forward with subcutaneous injection or intravenous Actemra. Need to clarify based on father's verbalization of IV medication preference to specialty pharmacy. Marylene Land confirmed they would move forward with pen as this is what Aikam prefers. Encouraged them to let us know of any questions they have after receiving the medication, scheduled for delivery today.    Worthy Flank, PharmD, CPP  Clinical Pharmacist, [facility]  Phone: [phone]

## 2024-01-08 LAB — LAMOTRIGINE LEVEL: LAMOTRIGINE LEVEL: 6.3 ug/mL

## 2024-01-30 NOTE — Unmapped (Signed)
 [facility] Clinical Assessment & Refill Coordination Note    Austin Banks, DOB: [birth year]  Phone: [phone] (home)     All above HIPAA information was verified with patient.     Was a Nurse, learning disability used for this call? No    Specialty Medication(s):   Neurology: Actemra     Current Outpatient Medications   Medication Sig Dispense Refill    cenobamate (XCOPRI) tablet Take 400 mg by mouth nightly. 60 tablet 5    cetirizine (ZYRTEC) 10 MG tablet Take 1 tablet (10 mg total) by mouth every morning.      clonazePAM (KLONOPIN) 1 MG tablet Take 1 tab by mouth in am and 2 tabs in pm.  May take additional tabs after a big seizure or 2 small one.  No more than 4 tabs a day 120 tablet 5    divalproex ER (DEPAKOTE ER) 500 MG extended released 24 hr tablet Take 3 tablets (1,500 mg total) by mouth nightly. 90 tablet 11    empty container Misc Use as directed to dispose of injections 1 each 3    folic acid (FOLVITE) 1 MG tablet Take 1 tablet (1 mg total) by mouth daily. 30 tablet 5    haloperidol (HALDOL) 5 MG tablet Take 3 tablets (15 mg total) by mouth nightly.      lamoTRIgine (LAMICTAL) 200 MG tablet Take 1 tablet (200 mg total) by mouth two (2) times a day. 60 tablet 11    levocarnitine 500 mg Tab Take 1 tablet (500 mg) by mouth Two (2) times a day. 60 tablet 11    midazolam (NAYZILAM) 5 mg/spray (0.1 mL) Spry 1 spray (5 mg) into 1 nostril for a seizure 3 minutes long; May give 2nd dose after 10 minutes for recurrent seizures. do not give a second dose if the patient has trouble breathing, MAX- 2 sprays in 72hrs and 4 sprays a week. 4 each 5    multivit-min-iron fum-folic ac 7.5 mg iron-400 mcg Tab Take 1 tablet by mouth daily.      ondansetron (ZOFRAN) 8 MG tablet Take 1 tablet (8 mg total) by mouth every twelve (12) hours as needed for nausea. 30 tablet 0    tocilizumab (ACTEMRA ACTPEN) 162 mg/0.9 mL PnIj Inject the contents of 1 pen (162 mg) under the skin every seven (7) days. 3.6 mL 5     No current facility-administered medications for this visit.        Changes to medications: Austin Banks reports no changes at this time.    Medication list has been reviewed and updated in Epic: Yes    Allergies   Allergen Reactions    Lacosamide Other (See Comments)     Psychosis    Hallucinations       Changes to allergies: No    Allergies have been reviewed and updated in Epic: Yes    SPECIALTY MEDICATION ADHERENCE     Actemra 162  mg/0.86ml : 1 doses of medicine on hand       Medication Adherence    Patient reported X missed doses in the last month: 0  Specialty Medication: Actemra  Patient is on additional specialty medications: No  Informant: mother          Specialty medication(s) dose(s) confirmed: Regimen is correct and unchanged.     Are there any concerns with adherence? No    Adherence counseling provided? Not needed    CLINICAL MANAGEMENT AND INTERVENTION  Clinical Benefit Assessment:    Do you feel the medicine is effective or helping your condition?  Unable to determine at this time    Clinical Benefit counseling provided? Not needed    Adverse Effects Assessment:    Are you experiencing any side effects? No    Are you experiencing difficulty administering your medicine? 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 For example, brushing your teeth or getting up in the morning. 0    Have you discussed this with your provider? Not needed    Acute Infection Status:    Acute infections noted within Epic:  No active infections    Patient reported infection: None    Therapy Appropriateness:    Is therapy appropriate based on current medication list, adverse reactions, adherence, clinical benefit and progress toward achieving therapeutic goals? Yes, therapy is appropriate and should be continued     Clinical Intervention:    Was an intervention completed as part of this clinical assessment? No    DISEASE/MEDICATION-SPECIFIC INFORMATION      For patients on injectable medications: Patient currently has 1 doses left.  Next injection is scheduled for 01/31/24.    Other Neurological Condtions: Not Applicable    PATIENT SPECIFIC NEEDS     Does the patient have any physical, cognitive, or cultural barriers? Yes - speak with mom    Is the patient high risk? No    Does the patient require physician intervention or other additional services (i.e., nutrition, smoking cessation, social work)? No    Does the patient have an additional or emergency contact listed in their chart? No, patient refused.    SOCIAL DETERMINANTS OF HEALTH     At the [facility], we have learned that life circumstances - like trouble affording food, housing, utilities, or transportation can affect the health of many of our patients.   That is why we wanted to ask: are you currently experiencing any life circumstances that are negatively impacting your health and/or quality of life? Patient declined to answer    Social Drivers of Health     Food Insecurity: Food Insecurity Present (10/09/2023)    Received from [facility]    Hunger Vital Sign     Worried About Running Out of Food in the Last Year: Often true     Ran Out of Food in the Last Year: Sometimes true   Internet Connectivity: Not on file   Housing/Utilities: Low Risk  (03/16/2022)    Housing/Utilities     Within the past 12 months, have you ever stayed: outside, in a car, in a tent, in an overnight shelter, or temporarily in someone else's home (i.e. couch-surfing)?: No     Are you worried about losing your housing?: No     Within the past 12 months, have you been unable to get utilities (heat, electricity) when it was really needed?: No   Tobacco Use: High Risk (01/04/2024)    Patient History     Smoking Tobacco Use: Every Day     Smokeless Tobacco Use: Never     Passive Exposure: Not on file Transportation Needs: No Transportation Needs (10/09/2023)    Received from [facility]    PRAPARE -  Transportation     In the past 12 months, has lack of transportation kept you from medical appointments or from getting medications?: No     Lack of Transportation (Non-Medical): No   Alcohol Use: Not on file   Interpersonal Safety: Not on file   Physical Activity: Not on file   Intimate Partner Violence: Not on file   Stress: Not on file   Substance Use: Not on file (10/06/2023)   Social Connections: Not on file   Financial Resource Strain: Low Risk  (10/09/2023)    Received from [facility]    Overall Financial Resource Strain (CARDIA)     Difficulty of Paying Living Expenses: Not very hard   Depression: At risk (10/09/2023)    Received from [facility]    PHQ-2     Patient Health Questionnaire-2 Score: 4   Health Literacy: Not on file       Would you be willing to receive help with any of the needs that you have identified today? Not applicable       SHIPPING     Specialty Medication(s) to be Shipped:   Neurology: Actemra    Other medication(s) to be shipped: No additional medications requested for fill at this time     Changes to insurance: No    Delivery Scheduled: Yes, Expected medication delivery date: 02/04/24.     Medication will be delivered via UPS to the confirmed prescription address in [facility].    The patient will receive a drug information handout for each medication shipped and additional FDA Medication Guides as required.  Verified that patient has previously received a Conservation officer, historic buildings and a Surveyor, mining.    The patient or caregiver noted above participated in the development of this care plan and knows that they can request review of or adjustments to the care plan at any time.      All of the patient's questions and concerns have been addressed.    Arnold Long, PharmD   [facility] Specialty Pharmacist

## 2024-02-02 ENCOUNTER — Inpatient Hospital Stay: Admit: 2024-02-02 | Discharge: 2024-02-03 | Payer: PRIVATE HEALTH INSURANCE

## 2024-02-02 ENCOUNTER — Ambulatory Visit: Admit: 2024-02-02 | Discharge: 2024-02-03 | Payer: PRIVATE HEALTH INSURANCE

## 2024-02-02 LAB — BASIC METABOLIC PANEL
ANION GAP: 13 mmol/L (ref 5–14)
BLOOD UREA NITROGEN: 14 mg/dL (ref 9–23)
BUN / CREAT RATIO: 14
CALCIUM: 9.7 mg/dL (ref 8.7–10.4)
CHLORIDE: 98 mmol/L (ref 98–107)
CO2: 26 mmol/L (ref 20.0–31.0)
CREATININE: 1 mg/dL (ref 0.73–1.18)
EGFR CKD-EPI (2021) MALE: >90 mL/min/{1.73_m2} (ref >=60)
GLUCOSE RANDOM: 94 mg/dL (ref 70–179)
POTASSIUM: 4 mmol/L (ref 3.4–4.8)
SODIUM: 137 mmol/L (ref 135–145)

## 2024-02-02 LAB — CBC W/ AUTO DIFF
BASOPHILS ABSOLUTE COUNT: 0 10*9/L (ref 0.0–0.1)
BASOPHILS RELATIVE PERCENT: 0.4 %
EOSINOPHILS ABSOLUTE COUNT: 0.3 10*9/L (ref 0.0–0.5)
EOSINOPHILS RELATIVE PERCENT: 5 %
HEMATOCRIT: 49.4 % — ABNORMAL HIGH (ref 39.0–48.0)
HEMOGLOBIN: 17 g/dL — ABNORMAL HIGH (ref 12.9–16.5)
LYMPHOCYTES ABSOLUTE COUNT: 2.5 10*9/L (ref 1.1–3.6)
LYMPHOCYTES RELATIVE PERCENT: 45.6 %
MEAN CORPUSCULAR HEMOGLOBIN CONC: 34.4 g/dL (ref 32.0–36.0)
MEAN CORPUSCULAR HEMOGLOBIN: 32.2 pg (ref 25.9–32.4)
MEAN CORPUSCULAR VOLUME: 93.5 fL (ref 77.6–95.7)
MEAN PLATELET VOLUME: 7.6 fL (ref 6.8–10.7)
MONOCYTES ABSOLUTE COUNT: 0.9 10*9/L — ABNORMAL HIGH (ref 0.3–0.8)
MONOCYTES RELATIVE PERCENT: 16.4 %
NEUTROPHILS ABSOLUTE COUNT: 1.8 10*9/L (ref 1.8–7.8)
NEUTROPHILS RELATIVE PERCENT: 32.6 %
PLATELET COUNT: 152 10*9/L (ref 150–450)
RED BLOOD CELL COUNT: 5.29 10*12/L (ref 4.26–5.60)
RED CELL DISTRIBUTION WIDTH: 14.4 % (ref 12.2–15.2)
WBC ADJUSTED: 5.4 10*9/L (ref 3.6–11.2)

## 2024-02-02 LAB — BLOOD GAS CRITICAL CARE PANEL, VENOUS
BASE EXCESS VENOUS: 2.5 — ABNORMAL HIGH (ref -2.0–2.0)
CALCIUM IONIZED VENOUS (MG/DL): 4.92 mg/dL (ref 4.40–5.40)
GLUCOSE WHOLE BLOOD: 86 mg/dL (ref 70–179)
HCO3 VENOUS: 29 mmol/L — ABNORMAL HIGH (ref 22–27)
HEMOGLOBIN BLOOD GAS: 16.9 g/dL (ref 13.50–17.50)
LACTATE BLOOD VENOUS: 2.2 mmol/L — ABNORMAL HIGH (ref 0.5–1.8)
O2 SATURATION VENOUS: 70.9 % (ref 40.0–85.0)
PCO2 VENOUS: 48 mmHg (ref 40–60)
PH VENOUS: 7.38 (ref 7.32–7.43)
PO2 VENOUS: 38 mmHg (ref 30–55)
POTASSIUM WHOLE BLOOD: 3.8 mmol/L (ref 3.4–4.6)
SODIUM WHOLE BLOOD: 137 mmol/L (ref 135–145)

## 2024-02-02 LAB — HEPATIC FUNCTION PANEL
ALBUMIN: 3.9 g/dL (ref 3.4–5.0)
ALKALINE PHOSPHATASE: 54 U/L (ref 46–116)
ALT (SGPT): 16 U/L (ref 10–49)
AST (SGOT): 26 U/L (ref <=34)
BILIRUBIN DIRECT: 0.2 mg/dL (ref 0.00–0.30)
BILIRUBIN TOTAL: 0.5 mg/dL (ref 0.3–1.2)
PROTEIN TOTAL: 7.3 g/dL (ref 5.7–8.2)

## 2024-02-02 LAB — VALPROIC ACID LEVEL, TOTAL: VALPROIC ACID TOTAL: 72.3 ug/mL (ref 50.0–100.0)

## 2024-02-02 MED ADMIN — levETIRAcetam (KEPPRA) injection 3,000 mg: 3000 mg | INTRAVENOUS | @ 23:00:00 | Stop: 2024-02-02

## 2024-02-02 NOTE — Unmapped (Addendum)
 FINAL LONGTERM VIDEO EEG MONITORING REPORT    Identifying Information   NAME: Austin Banks    MRN: [id number]   DOB: Oct 01, 1991      LOC: 01-B/01-B    HISTORY: 33 y.o. m w/pmh of fall, hepatitis B, shizophrenia, szs, TVI & vitamin D deficiency presenting w/multiple szs in one day. cEEG ordered for status concern.    INDICATION:  Evaluate for Electrographic Seizures and Encephalopathy    PATIENT STATE: Awake and Drowsy    Study Information  EEG Start:   February 02, 2024 at 20:05 hrs   EEG End:    March 9, 025 at 23:12 hrs      EEG TECHNICAL DESCRIPTION   Conditions of Recording:  Continuous EEG with simultaneous video recording was performed utilizing 21 active electrodes placed according to the international 10-20 system.  The study was recorded digitally with a bandpass of 1-70Hz  and a sampling rate of 200Hz  and was reviewed with the possibility of multiple reformatting.  The study was digitally processed with potential spike and seizure events identified for physician analysis and review.  Patient recognized events were identified by a push button marker and reviewed by the physician.   Simultaneous video was reviewed for all patient events.    EEG DESCRIPTION:       Initial Background Activity (see Daily Interpretation below for description of background changes that occur as the recording progresses)     Frequency (maximal in waking or arousal):  formed by beta and alpha frequencies    PDR:  Present PDR 11    Asymmetry/ Focal slowing: No    Amplitude: Normal (20 - 150 uV)    Continuity:  Continuous     Sleep Activity/ State Change: Indeterminate, EEG recording too short of duration  and but drowsy state was recorded with normal architecture    Reactivity: Present     Periodic/ Rhythmic Patterns/ Epileptiform Activity:  No.    Seizures: No.   If seizures are present, date and time of last seizure:        Patient Events: No.    DAY 1:  EEG INTERPRETATION:       Relevant Medications: Valproate, Levetiracetam lamotrigine and cenobamate     Normal awake and drowsy EEG       CLINICAL CORRELATION/ SUMMARY       This normal EEG study neither refutes nor supports a diagnosis of epilepsy.      Interpreting Attending: Elsie Amis, MD         [id number] Seizure Risk Score  Clinical risk score based on EEG findings and clinical history of seizures to aid in determination of optimal duration of EEG monitoring for detection of electrographic seizures.  Score has not been validated in patients under age 49 or following cardiac arrest.    No Pertinent EEG findings - 0 points     Add 1 point if known history of epilepsy or prior clinical seizure.      Risk Group     Seizure Risk   at 72 hours   Duration of Monitoring    Seizure risk < 5%     Duration of Monitoring    Seizure risk < 2%       Low Risk,   Score = 0     3.1%   1 Hour   3.3 Hours     Medium Risk,   Score = 1     12%   12 Hours   29 Hours  High Risk,   Score = 2 or greater                >25%   >24 Hours   >30 Hours   Oneita Kras al JAMA Neurology, January 2020    Not appropriate for EEG monitoring being performed for the following:  Treatment of status epilepticus, seizures already documented on EEG or Ictal Interictal continuum pattern (IIC)  Monitoring sedation/ burst suppression for management of intracranial pressure and/or paralyzed patients  Diagnostic evaluation of transient episodes concerning for possible seizures (spell capture)   Patients s/p cardiac arrest undergoing targeted temperature management      Anthoney Harada al. American Clinical Neurophysiology Society's Standardized Critical Care EEG Terminology: 2021 Version. Journal of Clinical Neurophysiology 38(1):p 1-29, January 2021.   ACNS STANDARDIZED ICU EEG NOMENCLATURE v ([email or web address])

## 2024-02-02 NOTE — Unmapped (Signed)
 ED Progress Note    Received signout for this patient at start of shift.  In short Austin Banks is a 33 year old male with history of seizure disorder and concern for poor medication compliance.  He is presenting after multiple seizure episodes x7 and falls x5 with the seizures. He sustained a lac to R eyebrow and abrasion behind R ear.     He is status post Keppra load and EEG initiation.  Neurology has been consulted.     ED Course as of 02/03/24 0231   Wynelle Link Feb 02, 2024   1916 Plan for admission to neurology   1945 Valproic Acid, Total: 72.3   1950 Chest x-ray without acute cardiopulmonary abnormality.   2151 CT head with no acute intracranial abnormality.    Mon Feb 03, 2024   0230 Patient remains somnolent despite being in ED for 8 hours.  Plan for admission to neurology for further observation and evaluation.      Sol Passer, MD  Anesthesiology PGY-1

## 2024-02-02 NOTE — Unmapped (Signed)
 ED Procedure Note    Lac Repair    Date/Time: 02/02/2024 9:46 PM    Performed by: Brown Human, MD  Authorized by: Brown Human, MD    Consent:     Consent obtained:  Verbal    Consent given by:  Patient    Risks discussed:  Infection  Universal protocol:     Patient identity confirmed:  Verbally with patient and provided demographic data  Anesthesia:     Anesthesia method:  None  Laceration details:     Location:  Ear    Ear location:  R ear    Length (cm):  3    Depth (mm):  1  Exploration:     Imaging outcome: foreign body not noted      Wound exploration: wound explored through full range of motion    Treatment:     Area cleansed with:  Saline    Amount of cleaning:  Extensive    Irrigation solution:  Sterile saline    Irrigation method:  Pressure wash    Visualized foreign bodies/material removed: no    Skin repair:     Repair method:  Tissue adhesive  Approximation:     Approximation:  Close  Repair type:     Repair type:  Simple  Post-procedure details:     Dressing:  Antibiotic ointment    Procedure completion:  Tolerated well, no immediate complications  Comments:      Superficial laceration behind R ear. Not involving cartilage.

## 2024-02-02 NOTE — Unmapped (Signed)
 ED Procedure Note    Lac Repair    Date/Time: 02/02/2024 9:45 PM    Performed by: Brown Human, MD  Authorized by: Brown Human, MD    Consent:     Consent obtained:  Verbal    Consent given by:  Patient    Risks discussed:  Infection and pain  Universal protocol:     Patient identity confirmed:  Verbally with patient and provided demographic data  Anesthesia:     Anesthesia method:  Local infiltration    Local anesthetic:  Lidocaine 1% WITH epi  Laceration details:     Location:  Face    Face location:  R eyebrow    Length (cm):  3    Depth (mm):  3  Pre-procedure details:     Preparation:  Patient was prepped and draped in usual sterile fashion and imaging obtained to evaluate for foreign bodies  Exploration:     Imaging outcome: foreign body not noted      Wound exploration: wound explored through full range of motion      Contaminated: no    Treatment:     Area cleansed with:  Chlorhexidine    Amount of cleaning:  Standard    Irrigation solution:  Sterile saline    Irrigation method:  Pressure wash    Visualized foreign bodies/material removed: no      Debridement:  None    Undermining:  None  Skin repair:     Repair method:  Sutures    Suture size:  5-0    Suture material:  Prolene    Number of sutures:  7  Approximation:     Approximation:  Close  Repair type:     Repair type:  Intermediate  Post-procedure details:     Dressing:  Antibiotic ointment    Procedure completion:  Tolerated well, no immediate complications

## 2024-02-02 NOTE — Unmapped (Signed)
 Initial Consult Note        Requesting Attending Physician:  Brown Human, MD  Service Requesting Consult: Emergency Medicine     Assessment and Plan          Austin Banks is a 33 y.o. 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    Patient presents after several reported seizure/seizure-like episodes at her friend's house and at home earlier today.  Last episode witnessed by father which was whole body shaking has been captured earlier during EMU stay without EEG correlate.  After last episode patient fell with head strike.He has history of polysubstance abuse, there is high concern patient has used substances again while staying at friend's house for past 2 days.(Himself neither reports or denies this). He was loaded with Keppra 3gm in ED and on exam appears lethargic otherwise mental status and motor exam at his baseline (see HPI), has somewhat inconsistent sensory exam affected by pain on the right side s/p fall, with decreased sensation on Left face and LUE which in settings of head strike could be c/f possible intracranial bleed. Unclear how many epileptic seizures he actually had over these past 2 days, but it is likely some were epileptic in nature given elevated lactate 2.2. However, exam is reassuring against ongoing seizures, somnolence likely 2/2 medications, postictal state and possible substance use. Would recommend to get CTH to r/o acute intracranial processes, get ASM levels (Depakote, Lamictal), follow-up on UA and Utox. EEG has been ordered per primary team earlier, this can be discontinued in ~several minutes to 1 hour based on results, since c/f ongoing seizures is minimal.   Eventually patient will have to resume home ASMs. (Safe since patient stated at friend's house only for 2 days and otherwise medication intake is checked on by father Re: Lamictal).    Home AEDs:  - Depakote ER 1500 mg qPM  Levocarnitine 500mg  twice a day  - Xcopri 400 mg PM  - Lamotrigine 200 mg BID  -  Clonazepam 1 mg in am and and 2mg  in pm. extra dose for seizure clusters    Recommendations:   - STAT CTH to r/o acute intracranial abnormalities such as hemorrhage   - Get AMS levels: Depakote and Lamictal   - Infectious/metabolic work-up per ED  - Follow-up on UA and Utox   - We will follow-up on EEG and update recommendations according to results of above tests     Update 10:25 PM:   -Head CT without acute abnormalities, Depakote level 72.3 (therapeutic), CXR without consolidation  -EEG  20:05-22:20 Normal drowsiness most of recording, no slowing, last 30-40 minutes more awake, no seizures or post-ictal changes (confirmed with reader)   -Utox and UA still pending   - Work-up thus far is reassuring, unclear of how many epileptic seizures patient actually had or when, his current somnolence could be contributed by being under the substance influence vs withdrawal (since EEG is so benign). He has baseline somnolence per documentation, anticipate his return to complete baseline in several hours. Mental status possibly also contributed by concussion s/p fall.     Updated recommendations:   - Please resume home PM antiseizure medications: Xcopri 400 mg PM, Lamotrigine 200 mg and Clonazepam 2 mg (father reported patient getting Depakote at home already)  - Stop EEG   - Follow-up  on Utox to clarify substance use, monitor signs of withdrawal  - Patient should be able to go home from our standpoint after he returns to his baseline (confirm with family). Of note he does have somnolence at baseline (per outpatient documentation).    This patient was discussed with Dr. Lysle Rubens, who agrees with the above assessment and plan.      Hubert Azure, MD, PGY-3  Department of Neurology          HPI        Reason for Consult: Seizures    Austin Banks is a 33 y.o.  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    History provided by patient and his father at bedside.   Patient stayed at his friends house for past 2 days, father is concerned for possible drug use in friends house. Friend told the father that patient had typical seizure event yesterday morning, and around 3-4 episodes today of unclear description other than typical since this was witnessed by a friend. Last seizure-like event  was witnessed by father and was whole body shaking (per chart review this has been capture on EEG in the past without correlate-is not a seizure). After this shaking patient fell and hit his head. This happened at 5PM today. Since then patient has been somnolent but otherwise able to talk and complains of pain.   Father states he usually checks on his pill box at home and is confident patient has been taking his medications. Sometimes reportedly he drops pills but this happens very rarely.   - Patient himself complains of pain on the right side of his head and body, otherwise does not provide additional information except seizure-like events-   -Patient had last Tocilizumab (Actemra) injection on Friday (father gives it)    In ED CBC and BMP unremarkable, patient afebrile, venous lactate elevated to 2.2.   He was given Keppra 3g IV load and was hooked up to EEG per ED.     Baseline per father: Slurred speech, slow to respond, does follow commands, mostly does not know year (unless he remembers after being asked several times), does not know month and day of the week, but is oriented to self and location. Moves everything equally, has BLE weakness and sometimes can't move them well. States whenever patient has his typical events he ends up being somnolent/asleep for several hours afterwards.     Per chart review most recent ASM regiment is:  - Depakote ER 1500 mg qPM  - Levocarnitine 500mg  twice a day  - Xcopri 400 mg PM  - Lamotrigine 200 mg BID  - Clonazepam 1 mg in am and and 2mg  in pm. take extra dose for seizure clusters    Seizure/seizure-like event  Description: focal onset with secondary generalization  - Semiology: Begins with right eye deviation, left facial clonic jerking, eye blinking, mumbling. LUE flexed with coordinated back and forth movements of his bilateral lower extremities and drool from the left side of his mouth. All movement abruptly ceases at ictal offset with pelvic thrusting. Family also describes hand automatisms after shaking at times. Post-ictal confusion lasting up to 20 minutes.   Frequency: Previously 3 times per week, now 1 every 3-4 months     Seizure/seizure-like event  Description: focal unaware  - Semiology: Behavior arrest and  arrest with confusion afterwards. Attempts to remove clothes afterwards  Frequency: Previously twice per month, now unclear    vEEG/ EMU evaluation: in June 2021  SUMMARY: This an abnormal EEG due to   - Background slowing   - Epileptiform activity seen independently in the right and left, maximal right temporal >frontal-central regions   - Electro clinical seizures, with less well defined ictal  patterns, although with suggestion of left and right  frontal-temporal onsets.   CLINICAL INTERPRETATION   This EEG is consistent with a moderate a diffuse cerebral  dysfunction which could be secondary to toxic, metabolic, or  primary neuronal disorder. The epileptiform activity suggests a   potential for focal onset seizures involving the right and left,  maximal right temporal regions. Electro clinical seizures, of  frontal semiology and likely involving the left and right head  regions were seen as described above.  Of note, there are body  movements seen that are not epileptic in nature.    Allergies   Allergen Reactions    Lacosamide Other (See Comments) Psychosis    Hallucinations      No current facility-administered medications for this encounter.     Current Outpatient Medications   Medication Sig Dispense Refill    cenobamate (XCOPRI) tablet Take 400 mg by mouth nightly. 60 tablet 5    cetirizine (ZYRTEC) 10 MG tablet Take 1 tablet (10 mg total) by mouth every morning.      clonazePAM (KLONOPIN) 1 MG tablet Take 1 tab by mouth in am and 2 tabs in pm.  May take additional tabs after a big seizure or 2 small one.  No more than 4 tabs a day 120 tablet 5    divalproex ER (DEPAKOTE ER) 500 MG extended released 24 hr tablet Take 3 tablets (1,500 mg total) by mouth nightly. 90 tablet 11    empty container Misc Use as directed to dispose of injections 1 each 3    folic acid (FOLVITE) 1 MG tablet Take 1 tablet (1 mg total) by mouth daily. 30 tablet 5    haloperidol (HALDOL) 5 MG tablet Take 3 tablets (15 mg total) by mouth nightly.      lamoTRIgine (LAMICTAL) 200 MG tablet Take 1 tablet (200 mg total) by mouth two (2) times a day. 60 tablet 11    levocarnitine 500 mg Tab Take 1 tablet (500 mg) by mouth Two (2) times a day. 60 tablet 11    midazolam (NAYZILAM) 5 mg/spray (0.1 mL) Spry 1 spray (5 mg) into 1 nostril for a seizure 3 minutes long; May give 2nd dose after 10 minutes for recurrent seizures. do not give a second dose if the patient has trouble breathing, MAX- 2 sprays in 72hrs and 4 sprays a week. 4 each 5    multivit-min-iron fum-folic ac 7.5 mg iron-400 mcg Tab Take 1 tablet by mouth daily.      ondansetron (ZOFRAN) 8 MG tablet Take 1 tablet (8 mg total) by mouth every twelve (12) hours as needed for nausea. 30 tablet 0    tocilizumab (ACTEMRA ACTPEN) 162 mg/0.9 mL PnIj Inject the contents of 1 pen (162 mg) under the skin every seven (7) days. 3.6 mL 5    (Not in a hospital admission)     Past Medical History:   Diagnosis Date    Fall     from medications    Hepatitis B surface antigen positive     Schizophrenia (CMS-HCC)     Seizure (  CMS-HCC)     TBI (traumatic brain injury) (CMS-HCC)     Vitamin D deficiency        Past Surgical History:   Procedure Laterality Date    MANDIBLE FRACTURE SURGERY         Social History     Socioeconomic History    Marital status: Single   Tobacco Use    Smoking status: Every Day     Current packs/day: 1.00     Average packs/day: 1 pack/day for 10.0 years (10.0 ttl pk-yrs)     Types: Cigarettes    Smokeless tobacco: Never    Tobacco comments:     10-20cpd   Vaping Use    Vaping status: Never Used   Substance and Sexual Activity    Alcohol use: Not Currently     Comment: rare    Drug use: Yes     Frequency: 7.0 times per week     Types: Marijuana, Cocaine   Social History Narrative    Updated May 2023        PSYCHIATRIC HX:     -Prior Dx: Schizophrenia. Seizures started ~age 61 around same time of head injury (unknown mechanism), psychotic symptoms ~age 23 by chart review. Seizures intractible for many years, in 2021 was found to be GAD65+ in serum and CSF, has been receiving IVIG + cellcept or rituximab since then. Historically in post-ictal state has had more psychotic symptoms/agitation.     -Hospitalizations: 10+ prior including at [facility]; most recent at [facility] in 2021, involuntarily on 3NSH due to psychosis. Last known in Nov 2022 at [facility] for delusions/AH    -Suicide attempts: denies     -Non-suicidal self-injury: denies      -Medication Trials: Zyprexa (stopped due to fear of increased seizure risk, was on doses as high as 40mg ), Risperdal (and Zyprexa and risperidone together at times), Geodon, abilify, Lamictal, Invega Sustenna x 2 months and stopped at [facility]. In spring 2023 admitted to Neuro on risperidone 3mg  BID, while hospitalized changed to haldol 5mg  BID (May 2023)    -Med compliance Hx: fair     -Current treatment: ACT team through [facility]    -Past Psych: [facility], [facility] in [place]. [facility]- many group homes and different ACT/CST in past        SUBSTANCE ABUSE HX:    # Marijuana: several times a day since age 12, reported use as recently as 2022    # Cocaine: prior use, occasionally. Last reported use ~2012    # Alcohol: Patient denies recent use. No known hx of Sz/DT/Complicated WD Hx, in 2016 drinking 2-3 drinks a day         Social Hx:    -Current Living Environment: Currently living with parents; previously at group homes; [facility] in [place]    -Guardian: Father; Mozell Hardacre (home [phone]), cell [phone])    -Sexual orientation: openly homosexual to family from 2016, also has a child     -Education/School Performance: High school, regular classes     -Income/Employment: disability check; -dad is his payee    -Legal/School Discipline: breaking and entering, assault charges     -Abuse/Neglect/Trauma/DV: denies     -Violence (perp): previous assault charges     -Weapons Access: denies guns        FAMILY HX    -Mental Illness: denies     -Suicide: denies     -Substance Abuse: extensive history alcohol and  recreational drug use     Social Drivers of Psychologist, prison and probation services Strain: Low Risk  (10/09/2023)    Received from [facility]    Overall Financial Resource Strain (CARDIA)     Difficulty of Paying Living Expenses: Not very hard   Food Insecurity: Food Insecurity Present (10/09/2023)    Received from [facility]    Hunger Vital Sign     Worried About Running Out of Food in the Last Year: Often true     Ran Out of Food in the Last Year: Sometimes true   Transportation Needs: No Transportation Needs (10/09/2023)    Received from [facility] - Transportation     In the past 12 months, has lack of transportation kept you from medical appointments or from getting medications?: No     Lack of Transportation (Non-Medical): No       Family History   Problem Relation Age of Onset    Hypertension Mother     Hypertension Father     Diabetes Father     Cancer Maternal Aunt     Cancer Maternal Grandmother     Cancer Paternal Grandfather     Seizures Neg Hx        Code Status: Full Code     Review of Systems     A 10-system review of systems was conducted and was negative except as documented above in the HPI.       Objective        Temp:  [36.8 ??C (98.3 ??F)] 36.8 ??C (98.3 ??F)  Heart Rate:  [88-112] 88  SpO2 Pulse:  [67-88] 88  Resp:  [20] 20  BP: (114)/(67) 114/67  MAP (mmHg):  [81] 81  SpO2:  [99 %-100 %] 100 %  No intake/output data recorded.    General Exam:  General Appearance: Somnolent, in no acute distress.   HEENT: Sculp hematoma on the right from hit, bleeding from behind right ear, no external canal blood appreciated   Neck: Grossly normal range of motion.  Lungs: Normal work of breathing.  Heart: Warm to touch, appears well perfused   Abdomen: Nondistended.  Extremities: No clubbing, cyanosis, or edema.    Neurological Exam:  Mental Status: Lethargic but awakens with tactile stimulation, oriented to self, location [facility]), year, month, not to the day of the week, oriented to situation, able to follow all commands but requires intermittent stimulation, slurred speech, intact comprehension, able to repeat.    Cranial Nerves: PERRL.  Visual fields appear intact . pursuit eye movements were slow but uninterrupted with full range and without more than end-gaze nystagmus.  Light touch on face decreased on left side (V1 V2 V3), pinprick decreased on right V1 distribution, and left V2 V3 distribution. Face symmetric at rest. Normal facial movement bilaterally, including forehead, eye closure and grimace/smile. Hearing intact to conversation. Tongue protrudes midline and tongue movements are normal.     Motor Exam: Normal bulk.  No tremors, myoclonus, or other adventitious movement.  Pronator drift is absent.  UE: Appears at least 4+/5 throughout  LE: 4+/5 throughout with some functional overlay inconsistently mostly proximally, when asked to raise straight leg versus strength testing with knee flexed    Reflexes: DTRs are 2+ and symmetric throughout. Toes are downgoing bilaterally.     Sensory:   Sensation to light touch decreased in left upper extremity distally and proximally  Sensation  to pinprick decreased in right hand, and left forearm below shoulder  Otherwise sensation intact in BLE    Cerebellar/Coordination/Gait: Gait deferred, coordination not assessed due to limited patient participation.        Diagnostic Studies      All Labs Last 24hrs:   Recent Results (from the past 24 hours)   Basic Metabolic Panel    Collection Time: 02/02/24  6:44 PM   Result Value Ref Range    Sodium 137 135 - 145 mmol/L    Potassium 4.0 3.4 - 4.8 mmol/L    Chloride 98 98 - 107 mmol/L    CO2 26.0 20.0 - 31.0 mmol/L    Anion Gap 13 5 - 14 mmol/L    BUN 14 9 - 23 mg/dL    Creatinine 0.98 1.19 - 1.18 mg/dL    BUN/Creatinine Ratio 14     eGFR CKD-EPI (2021) Male >90 >=60 mL/min/1.74m2    Glucose 94 70 - 179 mg/dL    Calcium 9.7 8.7 - 14.7 mg/dL   Hepatic Function Panel    Collection Time: 02/02/24  6:44 PM   Result Value Ref Range    Albumin 3.9 3.4 - 5.0 g/dL    Total Protein 7.3 5.7 - 8.2 g/dL    Total Bilirubin 0.5 0.3 - 1.2 mg/dL    Bilirubin, Direct 8.29 0.00 - 0.30 mg/dL    AST 26 <=56 U/L    ALT 16 10 - 49 U/L    Alkaline Phosphatase 54 46 - 116 U/L   Blood Gas Critical Care Panel, Venous    Collection Time: 02/02/24  6:44 PM   Result Value Ref Range    Specimen Source Venous     FIO2 Venous Room Air     pH, Venous 7.38 7.32 - 7.43    pCO2, Ven 48 40 - 60 mm Hg    pO2, Ven 38 30 - 55 mm Hg    HCO3, Ven 29 (H) 22 - 27 mmol/L    Base Excess, Ven 2.5 (H) -2.0 - 2.0    O2 Saturation, Venous 70.9 40.0 - 85.0 %    Sodium Whole Blood 137 135 - 145 mmol/L    Potassium, Bld 3.8 3.4 - 4.6 mmol/L    Calcium, Ionized Venous 4.92 4.40 - 5.40 mg/dL    Glucose Whole Blood 86 70 - 179 mg/dL    Lactate, Venous 2.2 (H) 0.5 - 1.8 mmol/L    Hgb, blood gas 16.90 13.50 - 17.50 g/dL   CBC w/ Differential    Collection Time: 02/02/24  6:44 PM   Result Value Ref Range    WBC 5.4 3.6 - 11.2 10*9/L    RBC 5.29 4.26 - 5.60 10*12/L    HGB 17.0 (H) 12.9 - 16.5 g/dL    HCT 21.3 (H) 08.6 - 48.0 %    MCV 93.5 77.6 - 95.7 fL    MCH 32.2 25.9 - 32.4 pg    MCHC 34.4 32.0 - 36.0 g/dL    RDW 57.8 46.9 - 62.9 %    MPV 7.6 6.8 - 10.7 fL    Platelet 152 150 - 450 10*9/L    Neutrophils % 32.6 %    Lymphocytes % 45.6 %    Monocytes % 16.4 %    Eosinophils % 5.0 %    Basophils % 0.4 %    Absolute Neutrophils 1.8 1.8 - 7.8 10*9/L    Absolute Lymphocytes 2.5 1.1 - 3.6 10*9/L    Absolute  Monocytes 0.9 (H) 0.3 - 0.8 10*9/L    Absolute Eosinophils 0.3 0.0 - 0.5 10*9/L    Absolute Basophils 0.0 0.0 - 0.1 10*9/L     Personally reviewed imaging below:  CT Head w/o contrast Results for orders placed during the hospital encounter of 01/04/24  CT head WO contrast  Impression  --No acute intracranial abnormality.  --Unchanged chronic right-sided paranasal sinusitis.  Signed by: Howard Pouch on 01/04/2024 11:38 AM        Brain MRI with and w/o contrast Results for orders placed during the hospital encounter of 03/13/22  MRI Brain W Wo Contrast  Impression  Mild right greater than left cerebral atrophy, disproportionate for age and disproportionate to lack of supratentorial atrophy.     Unchanged right paranasal sinusitis.  Signed by: Judeth Porch on 03/28/2022  4:45 PM

## 2024-02-02 NOTE — Unmapped (Signed)
 Here for 7 seizures today resulting in 5 falls. Drooling in triage, intermittently altered.  Hit his head today while seizing with + LOC after.

## 2024-02-02 NOTE — Unmapped (Signed)
 [facility]  Emergency Department Provider Note     ED Clinical Impression     Final diagnoses:   Seizure (CMS-HCC) (Primary)      Impression, Medical Decision Making, ED Course     Impression: 33 y.o. male with PMH most significant for substance abuse and autoimmune epilepsy who presents with several seizures with postictal state as described below.    DDx/MDM: Status epilepticus, breakthrough seizures    Diagnostic workup as below. Will treat patient with Keppra load with additional antiseizure medications as recommended by neurology consult team.    Orders Placed This Encounter   Procedures    XR Chest Portable    CT head WO contrast    CBC w/ Differential    Basic Metabolic Panel    Hepatic Function Panel    Toxicology Screen, Urine    Urinalysis with Microscopy with Culture Reflex    Blood Gas Critical Care Panel, Venous    Lamotrigine level    Valproic Acid Level    Inpatient consult to Neurology    ECG 12 Lead    EEG video monitoring    Insert peripheral IV            MDM Elements  Discussion of Management with other Physicians, QHP or Appropriate Source: Consultant - neurology  Social Determinants that significantly affected care: Occultly adhering to antiseizure medicines  ____________________________________________    The case was discussed with the attending physician, who is in agreement with the above assessment and plan.      History     Chief Complaint  No chief complaint on file.      HPI   Austin Banks is a 33 y.o. male with past medical history as below who presents with seizures for multiple days with a ground-level fall.    He has had several episodes (up to 10 per his father) of generalized shaking followed by falling.  He developed a laceration of the right side of his face yesterday of one of his falls.  Today, his parents made sure that he took his Depakote at 3 p.m.  He then at 5 PM had an episode where he fell.  unclear part of the fall there whether this was preceded by seizure and on, but given frequent falls, the patient was brought to ED for evaluation and management.    His father is not entirely confident that the patient takes all of his antiseizure medicines as prescribed.  Per father, the patient tends to have grand mal seizures at baseline.    Outside Historian(s): I have obtained additional history/collateral from patient's father, as the patient is postictal at this time.    Past Medical History:   Diagnosis Date    Fall     from medications    Hepatitis B surface antigen positive     Schizophrenia (CMS-HCC)     Seizure (CMS-HCC)     TBI (traumatic brain injury) (CMS-HCC)     Vitamin D deficiency        Past Surgical History:   Procedure Laterality Date    MANDIBLE FRACTURE SURGERY         No current facility-administered medications for this encounter.    Current Outpatient Medications:     cenobamate (XCOPRI) tablet, Take 400 mg by mouth nightly., Disp: 60 tablet, Rfl: 5    cetirizine (ZYRTEC) 10 MG tablet, Take 1 tablet (10 mg total) by mouth every morning., Disp: , Rfl:     clonazePAM (KLONOPIN) 1  MG tablet, Take 1 tab by mouth in am and 2 tabs in pm.  May take additional tabs after a big seizure or 2 small one.  No more than 4 tabs a day, Disp: 120 tablet, Rfl: 5    divalproex ER (DEPAKOTE ER) 500 MG extended released 24 hr tablet, Take 3 tablets (1,500 mg total) by mouth nightly., Disp: 90 tablet, Rfl: 11    empty container Misc, Use as directed to dispose of injections, Disp: 1 each, Rfl: 3    folic acid (FOLVITE) 1 MG tablet, Take 1 tablet (1 mg total) by mouth daily., Disp: 30 tablet, Rfl: 5    haloperidol (HALDOL) 5 MG tablet, Take 3 tablets (15 mg total) by mouth nightly., Disp: , Rfl:     lamoTRIgine (LAMICTAL) 200 MG tablet, Take 1 tablet (200 mg total) by mouth two (2) times a day., Disp: 60 tablet, Rfl: 11    levocarnitine 500 mg Tab, Take 1 tablet (500 mg) by mouth Two (2) times a day., Disp: 60 tablet, Rfl: 11    midazolam (NAYZILAM) 5 mg/spray (0.1 mL) Spry, 1 spray (5 mg) into 1 nostril for a seizure 3 minutes long; May give 2nd dose after 10 minutes for recurrent seizures. do not give a second dose if the patient has trouble breathing, MAX- 2 sprays in 72hrs and 4 sprays a week., Disp: 4 each, Rfl: 5    multivit-min-iron fum-folic ac 7.5 mg iron-400 mcg Tab, Take 1 tablet by mouth daily., Disp: , Rfl:     ondansetron (ZOFRAN) 8 MG tablet, Take 1 tablet (8 mg total) by mouth every twelve (12) hours as needed for nausea., Disp: 30 tablet, Rfl: 0    tocilizumab (ACTEMRA ACTPEN) 162 mg/0.9 mL PnIj, Inject the contents of 1 pen (162 mg) under the skin every seven (7) days., Disp: 3.6 mL, Rfl: 5    Allergies  Lacosamide    Family History  Family History   Problem Relation Age of Onset    Hypertension Mother     Hypertension Father     Diabetes Father     Cancer Maternal Aunt     Cancer Maternal Grandmother     Cancer Paternal Grandfather     Seizures Neg Hx        Social History  Social History     Tobacco Use    Smoking status: Every Day     Current packs/day: 1.00     Average packs/day: 1 pack/day for 10.0 years (10.0 ttl pk-yrs)     Types: Cigarettes    Smokeless tobacco: Never    Tobacco comments:     10-20cpd   Vaping Use    Vaping status: Never Used   Substance Use Topics    Alcohol use: Not Currently     Comment: rare    Drug use: Yes     Frequency: 7.0 times per week     Types: Marijuana, Cocaine        Physical Exam     VITAL SIGNS:      Vitals:    02/02/24 1840 02/02/24 1849 02/02/24 1853 02/02/24 1854   BP: 114/67      Pulse:  88     Resp:  20     Temp:   36.8 ??C (98.3 ??F)    TempSrc:   Oral    SpO2: 100% 100%     Weight:    70.8 kg (156 lb 1.4 oz)  Constitutional: Alert and oriented to name, person, year, but not month.  Very drowsy but responsive.  Eyes: Conjunctivae are normal.  HEENT: Large laceration above right eyebrow.   Cardiovascular: Rate as above, regular rhythm. Normal and symmetric distal pulses. Normal skin turgor.  Respiratory: Normal respiratory effort. Breath sounds are normal.   Gastrointestinal: Soft, non-distended, non-tender.  Genitourinary: Deferred.  Musculoskeletal: Pain limited with motion in bilateral lower extremities.   Neurologic: Patient noted to have extensive nystagmus.  Numbness to palpation of right face, arm, leg.  4/4+ strength in all major muscle groups with significant prompting.  Cranial nerves II through XII intact aside from CN V.  Skin: Skin is warm, dry and intact. No rash noted.  Psychiatric: Mood and affect are normal. Speech and behavior are normal.     Radiology     XR Chest Portable    (Results Pending)   CT head WO contrast    (Results Pending)       Pertinent labs & imaging results that were available during my care of the patient were independently interpreted by me and considered in my medical decision making (see chart for details).    Portions of this record have been created using Scientist, clinical (histocompatibility and immunogenetics). Dictation errors have been sought, but may not have been identified and corrected.    Jilda Panda, M.D.  PGY-1  Department of Neurology  Pager: [phone]       Jilda Panda, MD  Resident  02/02/24 [phone]

## 2024-02-02 NOTE — Unmapped (Signed)
 1500mg  of Keppra given IVP per attending Neva Seat, MD

## 2024-02-03 LAB — URINALYSIS WITH MICROSCOPY WITH CULTURE REFLEX PERFORMABLE
BACTERIA: NONE SEEN /HPF
BILIRUBIN UA: NEGATIVE
BLOOD UA: NEGATIVE
GLUCOSE UA: NEGATIVE
LEUKOCYTE ESTERASE UA: NEGATIVE
NITRITE UA: NEGATIVE
PH UA: 6.5 (ref 5.0–9.0)
RBC UA: <1 /HPF (ref <=3)
SPECIFIC GRAVITY UA: 1.027 (ref 1.003–1.030)
SQUAMOUS EPITHELIAL: <1 /HPF (ref 0–5)
UROBILINOGEN UA: <2
WBC UA: 1 /HPF (ref <=2)

## 2024-02-03 LAB — TOXICOLOGY SCREEN, URINE
AMPHETAMINE SCREEN URINE: POSITIVE — AB
BARBITURATE SCREEN URINE: NEGATIVE
BENZODIAZEPINE SCREEN, URINE: NEGATIVE
BUPRENORPHINE, URINE SCREEN: NEGATIVE
CANNABINOID SCREEN URINE: POSITIVE — AB
COCAINE(METAB.)SCREEN, URINE: POSITIVE — AB
FENTANYL SCREEN, URINE: NEGATIVE
METHADONE SCREEN, URINE: NEGATIVE
OPIATE SCREEN URINE: NEGATIVE
OXYCODONE SCREEN URINE: NEGATIVE

## 2024-02-03 MED ADMIN — lidocaine-EPINEPHrine (XYLOCAINE W/EPI) 1 %-1:100,000 injection 1 mL: 1 mL | @ 02:00:00 | Stop: 2024-02-02

## 2024-02-03 MED ADMIN — clonazePAM (KlonoPIN) tablet 1 mg: 1 mg | ORAL | @ 13:00:00 | Stop: 2024-02-03

## 2024-02-03 MED ADMIN — lamoTRIgine (LaMICtal) tablet 200 mg: 200 mg | ORAL | @ 13:00:00 | Stop: 2024-02-03

## 2024-02-03 MED ADMIN — lamoTRIgine (LaMICtal) tablet 200 mg: 200 mg | ORAL | @ 03:00:00 | Stop: 2024-02-02

## 2024-02-03 MED ADMIN — diph,pertuss(acel),tetanus vaccine-Tdap (BOOSTRIX) injection 0.5 mL: .5 mL | INTRAMUSCULAR | @ 02:00:00 | Stop: 2024-02-02

## 2024-02-03 MED ADMIN — clonazePAM (KlonoPIN) tablet 2 mg: 2 mg | ORAL | @ 03:00:00 | Stop: 2024-02-02

## 2024-02-03 MED ADMIN — bacitracin ointment: TOPICAL | @ 02:00:00 | Stop: 2024-02-02

## 2024-02-03 MED FILL — ACTEMRA ACTPEN 162 MG/0.9 ML SUBCUTANEOUS PEN INJECTOR: SUBCUTANEOUS | 28 days supply | Qty: 3.6 | Fill #1

## 2024-02-03 NOTE — Unmapped (Signed)
 Care Management  Initial Transition Planning Assessment              General  Care Manager assessed the patient by : Medical record review, Discussion with Clinical Care team  Orientation Level: Oriented X4  Reason for referral: Discharge Planning    Contact/Decision [facility]  Extended Emergency Contact Information  Primary Emergency Contact: [facility]  Address: [address]           [place], [place] [address] [place]  Home Phone: [phone]  Mobile Phone: [phone]  Relation: Father  Secondary Emergency Contact: Charolette Forward  Address: [address]           [place], [place] [address] [place]  Home Phone: [phone]  Mobile Phone: [phone]  Relation: Mother    Legal Next of Kin / Guardian / POA / Advance Directives     HCDM (patient stated preference): Austin Banks, Austin Banks - Father - [phone]    HCDM (patient stated preference): Austin Banks, Austin Banks - Mother - [phone]       Readmission Information    Have you been hospitalized in the last 30 days?: No              Did the following happen with your discharge?       Patient Information  Lives with:      Type of Residence: Private residence           Currently receiving outpatient dialysis?: No       Financial Information       Need for financial assistance?: No       Social Determinants of Health  Social Drivers of Health     Food Insecurity: Food Insecurity Present (10/09/2023)    Received from [facility]    Hunger Vital Sign     Worried About Running Out of Food in the Last Year: Often true     Ran Out of Food in the Last Year: Sometimes true   Tobacco Use: High Risk (01/28/2024)    Received from [facility]    Patient History     Smoking Tobacco Use: Every Day     Smokeless Tobacco Use: Never     Passive Exposure: Not on file   Transportation Needs: No Transportation Needs (10/09/2023)    Received from [facility] - Transportation     In the past 12 months, has lack of transportation kept you from medical appointments or from getting medications?: No     Lack of Transportation (Non-Medical): No   Alcohol Use: Not on file   Housing: Unknown (10/09/2023)    Received from [facility]    Housing Stability Vital Sign     Unable to Pay for Housing in the Last Year: No     Number of Times Moved in the Last Year: Not on file     Homeless in the Last Year: No   Physical Activity: Not on file   Utilities: Not At Risk (10/09/2023)    Received from [facility]     Threatened with loss of utilities: No   Stress: Not on file   Interpersonal Safety: Not on file   Substance Use: Not on file (10/06/2023)   Intimate Partner Violence: Not on file   Social Connections: Not on file   Financial Resource Strain: Low Risk  (10/09/2023)    Received from [facility]    Overall Financial Resource Strain (CARDIA)     Difficulty of Paying Living Expenses: Not very hard   Depression: At risk (10/09/2023)    Received from [facility]    PHQ-2     Patient Health Questionnaire-2 Score: 4   Internet Connectivity: Not on file   Health Literacy: Not on file       Complex Discharge Information    Is patient identified as a difficult/complex discharge?: No                      Interventions:       Discharge Needs Assessment  Concerns to be Addressed: no discharge needs identified    Clinical Risk Factors:      Barriers to taking medications: No                   Anticipated Changes Related to Illness: none    Equipment Needed After Discharge: none    Discharge Facility/Level of Care Needs:      Readmission  Risk of Unplanned Readmission Score:  %  Predictive Model Details   No score data available for [facility] Risk of Unplanned Readmission     Readmitted Within the Last 30 Days? (No if blank)   Patient at risk for readmission?: Yes    Discharge Plan       Expected Discharge Date: 02/03/2024    Expected Transfer from Critical Care:         Patient and/or family were provided with choice of facilities / services that are available and appropriate to meet post hospital care needs?: N/A       Initial Assessment complete?: Yes

## 2024-02-03 NOTE — Unmapped (Addendum)
 Austin Banks is a 32 y.o. 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     #Seizures   Patient reported multiple breakthrough seizures on 3/9 with one fall resulting in head strike. CTH negative for acute abnormalities, EEG consistent with normal drowsiness w/out seizures or post-ictal changes. Utox (+) for amphetamines, cocaine, and marijuana; pt endorses having smoked both crystal meth and crack cocaine on weekend of 3/8-3/9. Likely etiology substance use. Patient initially lethargic and not at baseline in the ED on 2/9, prompting c/f admission. Patient had one reported brief seizure in the ED (<10 seconds) on AM of 3/10 witnessed by patient's father. However, patient back at baseline per both patient and father, has a reassuring exam, and close outpatient follow up so will discharge on 3/10.     Workup:   EEG negative for seizures   CTH negative for acute abnormalities   Utox (+) for amphetamines, cocaine, marijuana   Depakote level: 72.3     Pending: Lamictal level     Plan:   - Continue home regiment as below:              - Depakote ER 1500 mg qPM              - Levocarnitine 500mg  twice a day             - Xcopri 400 mg PM             - Lamotrigine 200 mg BID             - Clonazepam 1 mg in am and and 2mg  in pm. extra dose for seizure clusters  - Follow up scheduled on 3/18 with Dr. Thedore Mins

## 2024-02-03 NOTE — Unmapped (Signed)
 Patient remained more than at baseline somnolent in ED. UDS still not obtained.   Patient was given Lamictal and Clonazepam in ED, per parents he got Depakote and Xcopri at home. Will admit to neurology for observation overnight.   Consult note from 3/9 will serve as H&P.     Dr. Lysle Rubens was available     Hubert Azure, MD, PGY-3  Department of Neurology

## 2024-02-03 NOTE — Unmapped (Signed)
 NMB (Neurology Team B)  Physician Discharge Summary        Admit date: 02/02/2024    Discharge date: 02/03/24    Discharge to: Home    Discharge Service: [facility] - Neurology Inpatient  B ([facility])     Discharge Physician: Elisabeth Most, MD    Discharge Diagnoses:   Active Problems:    * No active hospital problems. *  Resolved Problems:    Tonic-clonic seizures (CMS-HCC) (POA: Yes)    Follow-up Issues:   Follow-up appointment with Neurology: Dr. Thedore Mins on 02/11/2024     Pending Test Results:   Pending Labs       Order Current Status    Lamotrigine level In process            Procedures: cvEEG    Consults: none    Hospital Course:   Austin Banks is a 33 y.o. 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     #Seizures   Patient reported multiple breakthrough seizures on 3/9 with one fall resulting in head strike. CTH negative for acute abnormalities, EEG consistent with normal drowsiness w/out seizures or post-ictal changes. Utox (+) for amphetamines, cocaine, and marijuana; pt endorses having smoked both crystal meth and crack cocaine on weekend of 3/8-3/9. Likely etiology substance use. Patient initially lethargic and not at baseline in the ED on 2/9, prompting c/f admission. Patient had one reported brief seizure in the ED (<10 seconds) on AM of 3/10 witnessed by patient's father. However, patient back at baseline per both patient and father, has a reassuring exam, and close outpatient follow up so will discharge on 3/10.     Workup:   EEG negative for seizures   CTH negative for acute abnormalities   Utox (+) for amphetamines, cocaine, marijuana   Depakote level: 72.3     Pending: Lamictal level     Plan:   - Continue home regiment as below:              - Depakote ER 1500 mg qPM              - Levocarnitine 500mg  twice a day             - Xcopri 400 mg PM             - Lamotrigine 200 mg BID             - Clonazepam 1 mg in am and and 2mg  in pm. extra dose for seizure clusters  - Follow up scheduled on 3/18 with Dr. Thedore Mins      Condition at Discharge: fair    Discharge Exam:   Temp:  [36.7 ??C (98.1 ??F)-36.8 ??C (98.3 ??F)] 36.7 ??C (98.1 ??F)  Heart Rate:  [70-112] 86  SpO2 Pulse:  [67-88] 86  Resp:  [16-24] 24  BP: (91-114)/(60-71) 109/71  SpO2:  [94 %-100 %] 97 %    General Appearance:Well appearing.   HEENT: Bruising and edema present on R eyelid. Sclera anicteric without injection. Oropharyngeal membranes are moist with no erythema or exudate.  Cardiopulmonary: Regular rate and rhythm. No murmurs, rubs, or gallops. Clear to auscultation in anterior fields. No wheezes or crackles. Normal work of breathing on room air.  Abdomen: Soft, nontender, nondistended.  Extremities: No clubbing, cyanosis, or edema.    Neurological Examination:   Mental Status:  Alert, fully oriented, answers questions appropriately. Attention and concentration grossly intact to interview and physical. Spontaneous speech was fluent without word finding pauses, dysarthria, or paraphasic errors. Comprehension was intact to simple and multi-step commands. Memory for recent and remote events was intact.    Cranial Nerves: Visual fields intact to direct confrontation.  PERRL. Ocular movements intact in all directions. Beating horizontal nystagmus. Facial sensation intact bilaterally to light touch in all three divisions of CNV. Face symmetric at rest. Normal facial movement bilaterally, including forehead, eye closure and grimace/smile. Hearing intact to conversation. Palate movement is symmetric. Tongue protrudes midline and tongue movements are normal.    Motor Exam: Normal bulk.  No tremors, myoclonus, or other adventitious movement.    Reflexes: DTRs are 2+ and symmetric throughout. Toes are downgoing bilaterally.    Sensory: Sensation normal to light touch in both hands and both feet    Cerebellar/Coordination/Gait: Finger-to-nose is normal without ataxia or dysmetria bilaterally.    Pertinent Test Results:   Relevant Labs:  All Labs Last 24hrs:   Recent Results (from the past 24 hours)   Basic Metabolic Panel    Collection Time: 02/02/24  6:44 PM   Result Value Ref Range    Sodium 137 135 - 145 mmol/L    Potassium 4.0 3.4 - 4.8 mmol/L    Chloride 98 98 - 107 mmol/L    CO2 26.0 20.0 - 31.0 mmol/L    Anion Gap 13 5 - 14 mmol/L    BUN 14 9 - 23 mg/dL    Creatinine 1.61 0.96 - 1.18 mg/dL    BUN/Creatinine Ratio 14     eGFR CKD-EPI (2021) Male >90 >=60 mL/min/1.79m2    Glucose 94 70 - 179 mg/dL    Calcium 9.7 8.7 - 04.5 mg/dL   Hepatic Function Panel    Collection Time: 02/02/24  6:44 PM   Result Value Ref Range    Albumin 3.9 3.4 - 5.0 g/dL    Total Protein 7.3 5.7 - 8.2 g/dL    Total Bilirubin 0.5 0.3 - 1.2 mg/dL    Bilirubin, Direct 4.09 0.00 - 0.30 mg/dL    AST 26 <=81 U/L    ALT 16 10 - 49 U/L    Alkaline Phosphatase 54 46 - 116 U/L   Blood Gas Critical Care Panel, Venous    Collection Time: 02/02/24  6:44 PM   Result Value Ref Range    Specimen Source Venous     FIO2 Venous Room Air     pH, Venous 7.38 7.32 - 7.43    pCO2, Ven 48 40 - 60 mm Hg    pO2, Ven 38 30 - 55 mm Hg    HCO3, Ven 29 (H) 22 - 27 mmol/L    Base Excess, Ven 2.5 (H) -2.0 - 2.0    O2 Saturation, Venous 70.9 40.0 - 85.0 %    Sodium Whole Blood 137 135 - 145 mmol/L    Potassium, Bld 3.8 3.4 - 4.6 mmol/L    Calcium, Ionized Venous 4.92 4.40 - 5.40 mg/dL    Glucose Whole Blood 86 70 - 179 mg/dL    Lactate, Venous 2.2 (H) 0.5 - 1.8 mmol/L    Hgb, blood gas 16.90 13.50 - 17.50 g/dL   CBC w/ Differential    Collection Time: 02/02/24  6:44 PM   Result Value Ref Range    WBC 5.4 3.6 - 11.2 10*9/L    RBC 5.29 4.26 - 5.60 10*12/L  HGB 17.0 (H) 12.9 - 16.5 g/dL    HCT 16.1 (H) 09.6 - 48.0 %    MCV 93.5 77.6 - 95.7 fL    MCH 32.2 25.9 - 32.4 pg    MCHC 34.4 32.0 - 36.0 g/dL    RDW 04.5 40.9 - 81.1 %    MPV 7.6 6.8 - 10.7 fL Platelet 152 150 - 450 10*9/L    Neutrophils % 32.6 %    Lymphocytes % 45.6 %    Monocytes % 16.4 %    Eosinophils % 5.0 %    Basophils % 0.4 %    Absolute Neutrophils 1.8 1.8 - 7.8 10*9/L    Absolute Lymphocytes 2.5 1.1 - 3.6 10*9/L    Absolute Monocytes 0.9 (H) 0.3 - 0.8 10*9/L    Absolute Eosinophils 0.3 0.0 - 0.5 10*9/L    Absolute Basophils 0.0 0.0 - 0.1 10*9/L   Valproic Acid Level    Collection Time: 02/02/24  7:04 PM   Result Value Ref Range    Valproic Acid, Total 72.3 50.0 - 100.0 ug/mL   ECG 12 Lead    Collection Time: 02/02/24  7:33 PM   Result Value Ref Range    EKG Systolic BP  mmHg    EKG Diastolic BP  mmHg    EKG Ventricular Rate 94 BPM    EKG Atrial Rate 94 BPM    EKG P-R Interval 126 ms    EKG QRS Duration 78 ms    EKG Q-T Interval 376 ms    EKG QTC Calculation 470 ms    EKG Calculated P Axis 73 degrees    EKG Calculated R Axis 66 degrees    EKG Calculated T Axis 61 degrees    QTC Fredericia 436 ms   Toxicology Screen, Urine    Collection Time: 02/03/24  2:56 AM   Result Value Ref Range    Amphetamines Screen, Ur Positive (A) <500 ng/mL    Barbiturates Screen, Ur Negative <200 ng/mL    Benzodiazepines Screen, Urine Negative <200 ng/mL    Cannabinoids Screen, Ur Positive (A) <20 ng/mL    Methadone Screen, Urine Negative <300 ng/mL    Cocaine(Metab.)Screen, Urine Positive (A) <150 ng/mL    Opiates Screen, Ur Negative <300 ng/mL    Fentanyl Screen, Ur Negative <1.0 ng/mL    Oxycodone Screen, Ur Negative <100 ng/mL    Buprenorphine, Urine Negative <5 ng/mL   Urinalysis with Microscopy with Culture Reflex    Collection Time: 02/03/24  2:57 AM   Result Value Ref Range    Color, UA Yellow     Clarity, UA Clear     Specific Gravity, UA 1.027 1.003 - 1.030    pH, UA 6.5 5.0 - 9.0    Leukocyte Esterase, UA Negative Negative    Nitrite, UA Negative Negative    Protein, UA Trace (A) Negative    Glucose, UA Negative Negative    Ketones, UA Trace (A) Negative    Urobilinogen, UA <2.0 mg/dL <9.1 mg/dL Bilirubin, UA Negative Negative    Blood, UA Negative Negative    RBC, UA <1 <=3 /HPF    WBC, UA 1 <=2 /HPF    Squam Epithel, UA <1 0 - 5 /HPF    Bacteria, UA None Seen None Seen /HPF    Mucus, UA Rare (A) None Seen /HPF       Relevant Studies/Radiology:  CT Head w/o contrast Results for orders placed during the hospital encounter of 02/02/24  CT head WO contrast  Signed by: Neomia Dear, MD on 02/02/2024  9:17 PM      Results for orders placed during the hospital encounter of 01/04/24    CT head WO contrast    Impression  --No acute intracranial abnormality.  --Unchanged chronic right-sided paranasal sinusitis.    Signed by: Howard Pouch, MD on 01/04/2024 11:38 AM       Discharge Medications:     Your Medication List        STOP taking these medications      ondansetron 8 MG tablet  Commonly known as: ZOFRAN            CONTINUE taking these medications      ACTEMRA ACTPEN 162 mg/0.9 mL Pnij  Generic drug: tocilizumab  Inject the contents of 1 pen (162 mg) under the skin every seven (7) days.     cenobamate tablet  Commonly known as: XCOPRI  Take 400 mg by mouth nightly.     cetirizine 10 MG tablet  Commonly known as: ZYRTEC  Take 1 tablet (10 mg total) by mouth every morning.     clonazePAM 1 MG tablet  Commonly known as: KlonoPIN  Take 1 tab by mouth in am and 2 tabs in pm.  May take additional tabs after a big seizure or 2 small one.  No more than 4 tabs a day     divalproex ER 500 MG extended released 24 hr tablet  Commonly known as: DEPAKOTE ER  Take 3 tablets (1,500 mg total) by mouth nightly.     empty container Misc  Use as directed to dispose of injections     folic acid 1 MG tablet  Commonly known as: FOLVITE  Take 1 tablet (1 mg total) by mouth daily.     haloperidol 5 MG tablet  Commonly known as: HALDOL  Take 3 tablets (15 mg total) by mouth nightly.     lamoTRIgine 200 MG tablet  Commonly known as: LaMICtal  Take 1 tablet (200 mg total) by mouth two (2) times a day.     levocarnitine 500 mg Tab  Take 1 tablet (500 mg) by mouth Two (2) times a day.     multivit-min-iron fum-folic ac 7.5 mg iron-400 mcg Tab  Take 1 tablet by mouth daily.     NAYZILAM 5 mg/spray (0.1 mL) Spry  Generic drug: midazolam  1 spray (5 mg) into 1 nostril for a seizure 3 minutes long; May give 2nd dose after 10 minutes for recurrent seizures. do not give a second dose if the patient has trouble breathing, MAX- 2 sprays in 72hrs and 4 sprays a week.              Discharge Instructions:     Other Instructions       Discharge instructions      You have been seen at [facility] for evaluation and treatment of seizures. Seizures are caused by abnormal patterns of electrical signals in the brain. They may be different for each person. Seizures can affect movement, speech, vision, or awareness. Some people have only slight shaking of a hand and do not pass out. Other people may pass out and have shaking of the whole body. Some people appear to stare into space; they are awake, but they can't respond normally and later they may not remember what happened. When you leave the hospital:    Please, take your medications as prescribed. Call your doctor if  you think you are having a problem with your medicine. Go to all follow up appointments and be sure to bring a list of all your medications with you so your provider can better guide your care. Be sure that anyone treating you for any health problem knows that you have had a seizure and what medicines you are taking for it.    If you do not have another neurologist and do not hear from the [facility] within 1 to 2 weeks after your hospital discharge regarding a follow-up appointment, please call:  Neurology Clinic [phone]    How can you care for yourself at home?  Call 911 if you experience a seizure lasting more than 5 minutes, or 3 back-to-back seizures without return to your normal self in between. You should also return to the ED if you experience fevers/chills, chest pain, shortness of breath, headache that is worsening, a headache that does not go away, new weakness/paralysis, or any other symptoms that are new, worsening, or concerning to you.     If after leaving the hospital you notice new or worsening symptoms, or develop worsening abdominal pain with nausea or vomiting that prevents you from remaining hydrated or taking your medication, please seek medical advice.    Seizures may happen at any time. It is important to take certain precautions to maintain your safety. When possible, take showers instead of baths, as it is possible to drown in even shallow water during a seizure. Do not swim unsupervised or in open water where rescue could be difficult. Do not climb to heights and do not operate heavy machinery. When cooking, use the back burners of the stove and avoid open flames or hot stove tops. Avoid any activities which could be dangerous in the event of a loss of consciousness. Identify and avoid things that may make you more likely to have a seizure; these may include lack of sleep, alcohol or drug use, stress, or not eating.    When a seizure is occurring: make sure the person is safe and away from sharp objects, stairs, etc. Turn them on their side in case they may vomit to prevent them from choking. Do NOT try to put anything in their mouth or hold their mouth open as this can cause more injury.     What about driving?  You should not drive. You must report yourself to the [facility] manager Program by calling [phone]. You will be provided with a packet of paperwork regarding your medical and seizure history. Please fill out all personal information sections prior to giving this packet to your neurologist or primary care physician. An independent physician with the Department of Motor Vehicles will review your case and determine whether or not you will be allowed to drive. You should not drive until you receive a formal decision from the [facility].    Lamictal (lamotrigine)- potential side effects: Balance problem, Blurred or double vision, Sleepiness, Skin rashes, Dizziness, Nausea, Headaches, Drowsiness, Insomnia, Fatigue, Difficulty concentrating and Depakene/Depakote/Depakote ER (divalproex acid)- potential side effects: Hair loss, nausea, Tremor, Abnormal blood test results, Liver toxicity, Weight gain, Headaches, Drowsiness, Dizziness, Blurred or double vision, Twitching        Follow Up instructions and Outpatient Referrals     Discharge instructions        Appointments which have been scheduled for you      Feb 11, 2024 10:00 AM  (Arrive by 9:35 AM)  RETURN NEUROIMMUNOLOGY with Kelle Darting, MD  [facility] [address] [place] [facility] REGION) [address]  [address]  [place] [address]  [phone]        Jul 10, 2024 1:45 PM  (Arrive by 1:20 PM)  RETURN  EPILEPSY with Maceo Pro, FNP  [facility] [address] [facility] REGION) [address]  [place] [address]  [phone]                I spent greater than 30 minutes in the discharge of this patient.    Alcus Dad, MD, MD  PGY-1, Psychiatry

## 2024-02-03 NOTE — Unmapped (Signed)
 Pt has been discharged all ivs and monitors have been removed and pt understand meds and follow appointments.

## 2024-02-05 LAB — LAMOTRIGINE LEVEL: LAMOTRIGINE LEVEL: 4.2 ug/mL

## 2024-02-06 NOTE — Unmapped (Signed)
 CT Brain was request from [facility] to be push into [facility] power share.

## 2024-02-10 NOTE — Unmapped (Signed)
 [facility] [employer] [facility] at [facility]  Multiple Sclerosis/Neuroimmunology Division  Dr. Kelle Darting  MS/Neuroimmunology Fellow    DATE OF VISIT: 02/11/2024    Re:  Austin Banks  [address] [place] [address]  MRN: [id number]  DOB: 1991/05/29      Visit: FU visit   Last seen by Yolande Jolly, PA-C on 11/01/23       REASON FOR VISIT: Mr. Austin Banks, a 33 y.o. 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 Patient was recently seen at the hospital on 02/02/2024 and admitted for overnight observation after multiple breakthrough seizures. He is accompanied today in clinic by his parents.     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                                                                                                                               ?? 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 Escalated therapy due to increased frequency of seizures and behavioral control issues  - January 2022: tried on Cellcept but had adverse side effects of vomiting   - Rituxan from 06/2021 to 03/18/2022 and then transitioned to Cytoxan due to poor seizure control. (Rituximab induction doses were on 07/11/2021 and 07/25/2021).  - IV Cytoxan started on 05/24/2022. Did not receive 2nd dose and delay in re-starting.   - Cytoxan restarted on  08/23/2023 and continued on this for a total of approximately 10 treatments   - Switched to Actemra at the end of January 2025     Plan:   Continue Actemra 162 mg subcutaneous injection weekly.   Recent safety monitoring labs reviewed   Anti-seizure medications per epilepsy team:  Depakote ER 1500 mg nightly, Levocarnitine 500mg  BID, Xcopri 400 mg PM, Lamotrigine 200 mg BID, Clonazepam 1 mg AM/ 2 mg PM. Use Clonazepam and Nayzilam PRN for seizure clusters or GTC's lasting longer than 3 minutes   Patient counseled on drug use. He stopped using for a brief period and relapsed after 6 days. He is not receptive to the idea of going to a drug rehab center for detox.  Follow up with Dr.  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 Recent overnight admission on 02/02/24 due to breakthrough cluster of seizures - provoked in the setting of crystal meth and crack cocaine use.     INTERVAL HISTORY since last visit with Yolande Jolly, PA-C on 11/01/23    Austin Banks is in and out of sleep throughout the visit. His speech is dysarthric and he requires frequent re-orientation to answer. Per patient, he states he could careless about how many seizures he is having a day. He states he does not want to be sober. The only thing he cares about is making sure he doesn't die before is parents die. He says if he killed his parents then he would be able to die whenever he wanted. However, per patient's parents, he is not in his right state of mind. They assume he was up all night using drugs.   - Was seeing a substance abuse psychiatry physician and he stopped using crack cocaine for a total of 5-6 days. Austin Banks tells me 6 days is the longest he has gone sober without crack since he was in his early 20's. These days, this is the only drug he prefers.   - Admitted to the hospital on 02/02/24 for multiple breakthrough seizures leading to recurrent falls and parents became worried but he continued to have back to back small seizures  - Loaded with 3g of IV Keppra in the ED, monitored overnight, and discharged from ED after [facility] and routine EEG   - His mother gives him his Actemra shot every week and she gives him his anti-seizure medications 1 week in advance so that she can ensure he does not miss doses  - AED levels were therapeutic       Treatment history:  - Started on IVIG on 09/12/2020 1g/kg monthly >> until 02/27/2022. Escalated therapy due to increased frequency of seizures and behavioral control issues  - January 2022: tried on Cellcept but had adverse side effects of vomiting   - Rituxan from 06/2021 to 03/18/2022 and then transitioned to Cytoxan due to poor seizure control. (Rituximab induction doses were on 07/11/2021 and 07/25/2021).  - IV Cytoxan started on 05/24/2022. Did not receive 2nd dose and delay in re-starting.   - Cytoxan restarted on  08/23/2023 and continued on this for a total of approximately 10 treatments   - Switched to Actemra at the end of January 2025     ............................................................................................................................................Marland Kitchen  DIAGNOSTIC STUDIES / REVIEW OF RECORDS:    Prior medical records: reviewed personally, please see assessment    08/25/2020 CSF: Tube 1: 1 nucleated cell + 1 RBC. Tube 4: 1 nucleated cell + 0 RBC's. IgG index wnl 0.6. 4 or more positive oligoclonal bands. Encephalopathy autoimmune/paraneoplastic panel: GAD-65 elevated at 54.7  Serum GAD-65: elevated at 40.4 on 05/03/20 >> increased to 163 on 03/14/22 >> elevated at 145 on 04/13/22  .............................................................................................................................................      Past Medical History:   Diagnosis Date    Fall     from medications    Hepatitis B surface antigen positive     Schizophrenia (CMS-HCC)     Seizure (CMS-HCC)  TBI (traumatic brain injury) (CMS-HCC)     Vitamin D deficiency        Allergies   Allergen Reactions    Lacosamide Other (See Comments)     Psychosis    Hallucinations       Current Outpatient Medications   Medication Sig Dispense Refill    cenobamate (XCOPRI) tablet Take 400 mg by mouth nightly. 60 tablet 5    cetirizine (ZYRTEC) 10 MG tablet Take 1 tablet (10 mg total) by mouth every morning.      clonazePAM (KLONOPIN) 1 MG tablet Take 1 tab by mouth in am and 2 tabs in pm.  May take additional tabs after a big seizure or 2 small one.  No more than 4 tabs a day 120 tablet 5    divalproex ER (DEPAKOTE ER) 500 MG extended released 24 hr tablet Take 3 tablets (1,500 mg total) by mouth nightly. 90 tablet 11    folic acid (FOLVITE) 1 MG tablet Take 1 tablet (1 mg total) by mouth daily. 30 tablet 5    haloperidol (HALDOL) 5 MG tablet Take 3 tablets (15 mg total) by mouth nightly.      lamoTRIgine (LAMICTAL) 200 MG tablet Take 1 tablet (200 mg total) by mouth two (2) times a day. 60 tablet 11    levocarnitine 500 mg Tab Take 1 tablet (500 mg) by mouth Two (2) times a day. 60 tablet 11    midazolam (NAYZILAM) 5 mg/spray (0.1 mL) Spry 1 spray (5 mg) into 1 nostril for a seizure 3 minutes long; May give 2nd dose after 10 minutes for recurrent seizures. do not give a second dose if the patient has trouble breathing, MAX- 2 sprays in 72hrs and 4 sprays a week. 4 each 5    multivit-min-iron fum-folic ac 7.5 mg iron-400 mcg Tab Take 1 tablet by mouth daily.      tocilizumab (ACTEMRA ACTPEN) 162 mg/0.9 mL PnIj Inject the contents of 1 pen (162 mg) under the skin every seven (7) days. 3.6 mL 5    empty container Misc Use as directed to dispose of injections 1 each 3     No current facility-administered medications for this visit.         Past Surgical History:   Procedure Laterality Date    MANDIBLE FRACTURE SURGERY         Social History     Socioeconomic History    Marital status: Single     Spouse name: None    Number of children: None    Years of education: None    Highest education level: None   Tobacco Use    Smoking status: Every Day     Current packs/day: 1.00     Average packs/day: 1 pack/day for 10.0 years (10.0 ttl pk-yrs)     Types: Cigarettes    Smokeless tobacco: Never    Tobacco comments:     10-20cpd   Vaping Use    Vaping status: Never Used   Substance and Sexual Activity    Alcohol use: Not Currently     Comment: rare    Drug use: Yes     Frequency: 7.0 times per week     Types: Marijuana, Cocaine   Social History Narrative    Updated May 2023        PSYCHIATRIC HX:     -Prior Dx: Schizophrenia. Seizures started ~age 22 around same time of head injury (unknown mechanism), psychotic symptoms ~age 26 by  chart review. Seizures intractible for many years, in 2021 was found to be GAD65+ in serum and CSF, has been receiving IVIG + cellcept or rituximab since then. Historically in post-ictal state has had more psychotic symptoms/agitation.     -Hospitalizations: 10+ prior including at [facility]; most recent at [facility] in 2021, involuntarily on 3NSH due to psychosis. Last known in Nov 2022 at [facility] for delusions/AH    -Suicide attempts: denies     -Non-suicidal self-injury: denies      -Medication Trials: Zyprexa (stopped due to fear of increased seizure risk, was on doses as high as 40mg ), Risperdal (and Zyprexa and risperidone together at times), Geodon, abilify, Lamictal, Invega Sustenna x 2 months and stopped at [facility]. In spring 2023 admitted to Neuro on risperidone 3mg  BID, while hospitalized changed to haldol 5mg  BID (May 2023)    -Med compliance Hx: fair     -Current treatment: ACT team through [facility]    -Past Psych: [facility], [facility] in [place]. [facility]- many group homes and different ACT/CST in past        SUBSTANCE ABUSE HX:    # Marijuana: several times a day since age 68, reported use as recently as 2022    # Cocaine: prior use, occasionally. Last reported use ~2012    # Alcohol: Patient denies recent use. No known hx of Sz/DT/Complicated WD Hx, in 2016 drinking 2-3 drinks a day         Social Hx:    -Current Living Environment: Currently living with parents; previously at group homes; [facility] in [place]    -Guardian: Father; Austin Banks (home [phone]), cell [phone])    -Sexual orientation: openly homosexual to family from 2016, also has a child     -Education/School Performance: High school, regular classes     -Income/Employment: disability check; -dad is his payee    -Legal/School Discipline: breaking and entering, assault charges     -Abuse/Neglect/Trauma/DV: denies     -Violence (perp): previous assault charges     -Weapons Access: denies guns        FAMILY HX    -Mental Illness: denies     -Suicide: denies     -Substance Abuse: extensive history alcohol and recreational drug use     Social Drivers of Psychologist, prison and probation services Strain: Low Risk  (10/09/2023)    Received from [facility]    Overall Financial Resource Strain (CARDIA)     Difficulty of Paying Living Expenses: Not very hard   Food Insecurity: Food Insecurity Present (10/09/2023)    Received from [facility]    Hunger Vital Sign     Worried About Running Out of Food in the Last Year: Often true     Ran Out of Food in the Last Year: Sometimes true   Transportation Needs: No Transportation Needs (10/09/2023)    Received from [facility] - Transportation     In the past 12 months, has lack of transportation kept you from medical appointments or from getting medications?: No     Lack of Transportation (Non-Medical): No   Housing: Unknown (10/09/2023)    Received from [facility]    Housing Stability Vital Sign     Unable to Pay for Housing in the Last Year: No     Homeless in the Last Year: No  Family History   Problem Relation Age of Onset    Hypertension Mother     Hypertension Father     Diabetes Father     Cancer Maternal Aunt     Cancer Maternal Grandmother     Cancer Paternal Grandfather     Seizures Neg Hx         Review of Systems:  Otherwise negative unless stated in HPI/assessment     Objective:     Physical Exam:  Weight 74.4 kg (164 lb).     General Appearance:  ill appearing, poor overall hygiene and malodorous .   Requires frequent re-orientation to keep his eyes open; somnolent     NEUROLOGICAL EXAMINATION:     General:  Alert and oriented to self. States name correctly.   Speech is dysarthric. Illogical thought pattern.     Cranial Nerves:     III, IV, VI- multi directional nystagmus, 4-5 beats with horizontal end gaze    Motor Exam:   Moving all extremities spontaneously and anti gravity     Gait: Walks without assistance, normal casual gait     .........................................................................................................................................Marland Kitchen    VISIT SUMMARY:  Mr. Austin Banks, a 33 y.o. male  presented intractable epilepsy and recent ED visit.       I personally spent 45 minutes face-to-face and non-face-to-face in the care of this patient, which includes all pre, intra, and post visit time on the date of service.  All documented time was specific to the E/M visit and does not include any procedures that may have been performed.      I took a detailed history of the present illness from patient's parents and limited history from the patient. Relevant details on past medical history, family history, and social history were reviewed.   I personally reviewed  patient's prior medical records, radiology reports, and laboratory work.   I performed medication reconciliation  The plan was discussed with patient's parents who were amenable to the recommendations as detailed above        Case was discussed with attending physician, Dr. Quillian Quince     Thank you for the opportunity to contribute to the care of Mr. Austin Banks.

## 2024-02-11 ENCOUNTER — Ambulatory Visit
Admit: 2024-02-11 | Discharge: 2024-02-12 | Payer: PRIVATE HEALTH INSURANCE | Attending: Student in an Organized Health Care Education/Training Program | Primary: Student in an Organized Health Care Education/Training Program

## 2024-02-11 NOTE — Unmapped (Addendum)
 Thank you for visiting the [facility] MS/Neuroimmunology Clinic today!  Please see below our contact information:      In case of:  a suspected relapse (new symptoms or worsening existing symptoms, lasting for >24h)  OR  a need for an additional appointment for other reasons  OR   if you have other questions: please contact:      [facility] Neurology [facility] Desk  Phone: [phone]     If you have questions for our clinical pharmacist Worthy Flank, please call: [phone]    If you would have questions outside regular office hours, please call [facility] hospital operator:   Phone: [phone], and ask for a neurology resident on-call.               Dr. Kelle Darting  MS/Neuroimmunology Fellow  [facility] of Medicine, Department of Neurology  Multiple Sclerosis/Neuroimmunology Division    The [facility] MS/Neuroimmunology Clinic is a specialty clinic and there is a need for you to have a  primary care provider  who will take care of your non-neurological health.   ....................................................................................................    Our recommendations from today's visit:   No labs today   Continue Actemra   Follow up in 6 months; sooner if needed  Highly recommend patient to be admitted to a rehab facility   ........................................................................................Marland Kitchen    It was a pleasure to see you today!    We are grateful for the opportunity to contribute to your care and are looking forward to seeing you again.  Please if you can take time to complete a post-visit survey so we can identify opportunities for improvement. We also like to know when we do things well so we can continue providing great service.

## 2024-02-19 ENCOUNTER — Inpatient Hospital Stay: Admit: 2024-02-19 | Discharge: 2024-02-20 | Payer: PRIVATE HEALTH INSURANCE

## 2024-02-28 DIAGNOSIS — Z5181 Encounter for therapeutic drug level monitoring: Principal | ICD-10-CM

## 2024-02-28 NOTE — Unmapped (Signed)
 [facility] Specialty and Home Delivery Pharmacy Refill Coordination Note    Specialty Medication(s) to be Shipped:   Neurology: Actemra    Other medication(s) to be shipped: No additional medications requested for fill at this time     Austin Banks, DOB: [birth year]  Phone: [phone] (home)       All above HIPAA information was verified with patient's family member, mother.     Was a Nurse, learning disability used for this call? No    Completed refill call assessment today to schedule patient's medication shipment from the [facility] and Home Delivery Pharmacy  [phone]).  All relevant notes have been reviewed.     Specialty medication(s) and dose(s) confirmed: Regimen is correct and unchanged.   Changes to medications: Austin Banks reports no changes at this time.  Changes to insurance: No  New side effects reported not previously addressed with a pharmacist or physician: None reported  Questions for the pharmacist: No    Confirmed patient received a Conservation officer, historic buildings and a Surveyor, mining with first shipment. The patient will receive a drug information handout for each medication shipped and additional FDA Medication Guides as required.       DISEASE/MEDICATION-SPECIFIC INFORMATION        For patients on injectable medications: Patient currently has 0 doses left.  Next injection is scheduled for 4/11.    SPECIALTY MEDICATION ADHERENCE     Medication Adherence    Patient reported X missed doses in the last month: 0  Specialty Medication: Actemra  Informant: mother  Confirmed plan for next specialty medication refill: delivery by pharmacy  Refills needed for supportive medications: not needed          Refill Coordination    Has the Patients' Contact Information Changed: No  Is the Shipping Address Different: No         Were doses missed due to medication being on hold? No    Actemra 162  mg/0.79ml : 0 doses of medicine on hand       REFERRAL TO PHARMACIST     Referral to the pharmacist: Not needed      [facility]     Shipping address confirmed in Epic.     Cost and Payment: Patient has a copay of $4. They are aware and have authorized the pharmacy to charge the credit card on file.    Delivery Scheduled: Yes, Expected medication delivery date: 4/8.     Medication will be delivered via UPS to the prescription address in Epic WAM.    Austin Banks   [facility] Specialty and Home Delivery Pharmacy  Specialty Pharmacist

## 2024-02-28 NOTE — Unmapped (Signed)
 Per Worthy Flank, lipid panel ordered x1 which would be due now. Called patient's father. Patient's father understands.

## 2024-03-02 MED FILL — ACTEMRA ACTPEN 162 MG/0.9 ML SUBCUTANEOUS PEN INJECTOR: SUBCUTANEOUS | 28 days supply | Qty: 3.6 | Fill #2

## 2024-03-09 ENCOUNTER — Ambulatory Visit: Admit: 2024-03-09 | Discharge: 2024-03-10 | Payer: Medicaid (Managed Care)

## 2024-03-09 DIAGNOSIS — Z5181 Encounter for therapeutic drug level monitoring: Principal | ICD-10-CM

## 2024-03-09 LAB — HEPATIC FUNCTION PANEL
ALBUMIN: 3.7 g/dL (ref 3.4–5.0)
ALKALINE PHOSPHATASE: 46 U/L (ref 46–116)
ALT (SGPT): 14 U/L (ref 10–49)
AST (SGOT): 18 U/L (ref <=34)
BILIRUBIN DIRECT: 0.1 mg/dL (ref 0.00–0.30)
BILIRUBIN TOTAL: 0.4 mg/dL (ref 0.3–1.2)
PROTEIN TOTAL: 6.4 g/dL (ref 5.7–8.2)

## 2024-03-09 LAB — CBC W/ AUTO DIFF
BASOPHILS ABSOLUTE COUNT: 0 10*9/L (ref 0.0–0.1)
BASOPHILS RELATIVE PERCENT: 0.5 %
EOSINOPHILS ABSOLUTE COUNT: 0.5 10*9/L (ref 0.0–0.5)
EOSINOPHILS RELATIVE PERCENT: 12.2 %
HEMATOCRIT: 45.4 % (ref 39.0–48.0)
HEMOGLOBIN: 15.5 g/dL (ref 12.9–16.5)
LYMPHOCYTES ABSOLUTE COUNT: 2.2 10*9/L (ref 1.1–3.6)
LYMPHOCYTES RELATIVE PERCENT: 51.2 %
MEAN CORPUSCULAR HEMOGLOBIN CONC: 34 g/dL (ref 32.0–36.0)
MEAN CORPUSCULAR HEMOGLOBIN: 32.7 pg — ABNORMAL HIGH (ref 25.9–32.4)
MEAN CORPUSCULAR VOLUME: 96.3 fL — ABNORMAL HIGH (ref 77.6–95.7)
MEAN PLATELET VOLUME: 7.7 fL (ref 6.8–10.7)
MONOCYTES ABSOLUTE COUNT: 0.7 10*9/L (ref 0.3–0.8)
MONOCYTES RELATIVE PERCENT: 16.1 %
NEUTROPHILS ABSOLUTE COUNT: 0.9 10*9/L — ABNORMAL LOW (ref 1.8–7.8)
NEUTROPHILS RELATIVE PERCENT: 20 %
PLATELET COUNT: 156 10*9/L (ref 150–450)
RED BLOOD CELL COUNT: 4.72 10*12/L (ref 4.26–5.60)
RED CELL DISTRIBUTION WIDTH: 14.4 % (ref 12.2–15.2)
WBC ADJUSTED: 4.3 10*9/L (ref 3.6–11.2)

## 2024-03-09 LAB — LIPID PANEL
CHOLESTEROL/HDL RATIO SCREEN: 2.4 (ref 1.0–4.5)
CHOLESTEROL: 155 mg/dL (ref <=200)
HDL CHOLESTEROL: 65 mg/dL — ABNORMAL HIGH (ref 40–60)
LDL CHOLESTEROL CALCULATED: 74 mg/dL (ref 40–99)
NON-HDL CHOLESTEROL: 90 mg/dL (ref 70–130)
TRIGLYCERIDES: 78 mg/dL (ref 0–150)
VLDL CHOLESTEROL CAL: 15.6 mg/dL (ref 10–50)

## 2024-03-31 NOTE — Unmapped (Signed)
 [facility] Specialty and Home Delivery Pharmacy Refill Coordination Note    Specialty Medication(s) to be Shipped:   Neurology: Actemra    Other medication(s) to be shipped: No additional medications requested for fill at this time     Austin Banks, DOB: Jul 02, 1991  Phone: [phone] (home)       All above HIPAA information was verified with patient's family member, mother.     Was a Nurse, learning disability used for this call? No    Completed refill call assessment today to schedule patient's medication shipment from the [facility] and Home Delivery Pharmacy  [phone]).  All relevant notes have been reviewed.     Specialty medication(s) and dose(s) confirmed: Regimen is correct and unchanged.   Changes to medications: [name] reports no changes at this time.  Changes to insurance: No  New side effects reported not previously addressed with a pharmacist or physician: None reported  Questions for the pharmacist: No    Confirmed patient received a Conservation officer, historic buildings and a Surveyor, mining with first shipment. The patient will receive a drug information handout for each medication shipped and additional FDA Medication Guides as required.       DISEASE/MEDICATION-SPECIFIC INFORMATION        For patients on injectable medications: Patient currently has 0 doses left.  Next injection is scheduled for 4/11.    SPECIALTY MEDICATION ADHERENCE     Medication Adherence    Patient reported X missed doses in the last month: 0  Specialty Medication: ACTEMRA ACTPEN 162 mg/0.9 mL Pnij (tocilizumab)  Patient is on additional specialty medications: No  Patient is on more than two specialty medications: No  Any gaps in refill history greater than 2 weeks in the last 3 months: no  Demonstrates understanding of importance of adherence: yes                Were doses missed due to medication being on hold? No    Actemra 162 mg/0.62ml: 0 doses of medicine on hand       REFERRAL TO PHARMACIST     Referral to the pharmacist: Not needed      [facility] Shipping address confirmed in Epic.     Cost and Payment: Patient has a copay of $4. They are aware and have authorized the pharmacy to charge the credit card on file.    Delivery Scheduled: Yes, Expected medication delivery date: 4/8.     Medication will be delivered via UPS to the prescription address in Epic WAM.    Austin Banks   [facility] Specialty and Home Delivery Pharmacy  Specialty Pharmacist

## 2024-04-02 MED FILL — ACTEMRA ACTPEN 162 MG/0.9 ML SUBCUTANEOUS PEN INJECTOR: SUBCUTANEOUS | 28 days supply | Qty: 3.6 | Fill #3

## 2024-04-29 NOTE — Unmapped (Signed)
 [facility] Specialty and Home Delivery Pharmacy Refill Coordination Note    Specialty Medication(s) to be Shipped:   Inflammatory Disorders: Actemra    Other medication(s) to be shipped: No additional medications requested for fill at this time     Austin Banks, DOB: Sep 20, 1991  Phone: [phone] (home)       All above HIPAA information was verified with patient's family member, Mother.     Was a Nurse, learning disability used for this call? No    Completed refill call assessment today to schedule patient's medication shipment from the [facility] and Home Delivery Pharmacy  [phone]).  All relevant notes have been reviewed.     Specialty medication(s) and dose(s) confirmed: Regimen is correct and unchanged.   Changes to medications: Chritopher reports no changes at this time.  Changes to insurance: No  New side effects reported not previously addressed with a pharmacist or physician: None reported  Questions for the pharmacist: No    Confirmed patient received a Conservation officer, historic buildings and a Surveyor, mining with first shipment. The patient will receive a drug information handout for each medication shipped and additional FDA Medication Guides as required.       DISEASE/MEDICATION-SPECIFIC INFORMATION        For patients on injectable medications: Patient currently has 0 doses left.  Next injection is scheduled for 05/01/2024.    SPECIALTY MEDICATION ADHERENCE     Medication Adherence    Patient reported X missed doses in the last month: 0  Specialty Medication: ACTEMRA ACTPEN 162 mg/0.9 mL Pnij (tocilizumab)  Patient is on additional specialty medications: No              Were doses missed due to medication being on hold? No     ACTEMRA ACTPEN 162 mg/0.9 mL Pnij (tocilizumab): 0 doses of medicine on hand       REFERRAL TO PHARMACIST     Referral to the pharmacist: Not needed      [facility]     Shipping address confirmed in Epic.     Cost and Payment: Patient has a $0 copay, payment information is not required.    Delivery Scheduled: Yes, Expected medication delivery date: 05/01/2024.     Medication will be delivered via UPS to the prescription address in Epic WAM.    Lanny Plan   [facility] Specialty and Home Delivery Pharmacy  Specialty Technician

## 2024-04-29 NOTE — Unmapped (Signed)
 Patient's mother called to inquire about Actemra injection delivery. Returned call. Gave patient's mother contact number to [facility]. She will give them a call now.

## 2024-04-30 MED FILL — ACTEMRA ACTPEN 162 MG/0.9 ML SUBCUTANEOUS PEN INJECTOR: SUBCUTANEOUS | 28 days supply | Qty: 3.6 | Fill #4

## 2024-05-25 DIAGNOSIS — G0481 Other encephalitis and encephalomyelitis: Principal | ICD-10-CM

## 2024-05-25 MED ORDER — ACTEMRA ACTPEN 162 MG/0.9 ML SUBCUTANEOUS PEN INJECTOR
SUBCUTANEOUS | 5 refills | 28.00000 days
Start: 2024-05-25 — End: ?

## 2024-05-25 NOTE — Unmapped (Signed)
 [facility] Refill Coordination Note    Specialty Medication(s) to be Shipped:   Inflammatory Disorders: Actemra     Other medication(s) to be shipped: No additional medications requested for fill at this time     Austin Banks, DOB: December 23, 1990  Phone: [phone] (home)       All above HIPAA information was verified with patient's family member, Mother.     Was a Nurse, learning disability used for this call? No    Completed refill call assessment today to schedule patient's medication shipment from the [facility]  [phone]).  All relevant notes have been reviewed.     Specialty medication(s) and dose(s) confirmed: Regimen is correct and unchanged.   Changes to medications: Yamin reports no changes at this time.  Changes to insurance: No  New side effects reported not previously addressed with a pharmacist or physician: None reported  Questions for the pharmacist: No    Confirmed patient received a Conservation officer, historic buildings and a Surveyor, mining with first shipment. The patient will receive a drug information handout for each medication shipped and additional FDA Medication Guides as required.       DISEASE/MEDICATION-SPECIFIC INFORMATION        For patients on injectable medications: Patient currently has 0 doses left.  Next injection is scheduled for 7/3.    SPECIALTY MEDICATION ADHERENCE     Medication Adherence    Patient reported X missed doses in the last month: 0  Specialty Medication: ACTEMRA  ACTPEN 162 mg/0.9 mL Pnij (tocilizumab )  Patient is on additional specialty medications: No              Were doses missed due to medication being on hold? No    Actemra  162/0.9 mg/ml: 0 doses of medicine on hand        REFERRAL TO PHARMACIST     Referral to the pharmacist: Not needed      [facility]     Shipping address confirmed in Epic.     Cost and Payment: Patient has a copay of $4. They are aware and have authorized the pharmacy to charge the credit card on file.    Delivery Scheduled: Yes, Expected medication delivery date: 05/27/24.     Medication will be delivered via UPS to the prescription address in Epic WAM.    Kelly CHRISTELLA Eagles   [facility]  Specialty Technician

## 2024-05-25 NOTE — Unmapped (Signed)
 Refill request received from patient.      Medication Requested: Actemra  162 mg/0.9 mL  Last Office Visit: 11/26/2023  Next Office Visit: 07/10/2024  Last Prescriber: Amado Rockers    Nurse refill requirements met? No  If not met, why: Medication not on active SOP    Sent to: Provider for signing  If sent to provider, which provider?: Linh Ngo

## 2024-05-26 MED ORDER — ACTEMRA ACTPEN 162 MG/0.9 ML SUBCUTANEOUS PEN INJECTOR
SUBCUTANEOUS | 3 refills | 28.00000 days | Status: CP
Start: 2024-05-26 — End: ?
  Filled 2024-05-26: qty 3.6, 28d supply, fill #5
  Filled 2024-06-25: qty 3.6, 28d supply, fill #0

## 2024-05-26 NOTE — Unmapped (Signed)
 Dr. Dennise to refill

## 2024-06-01 DIAGNOSIS — G40909 Epilepsy, unspecified, not intractable, without status epilepticus: Principal | ICD-10-CM

## 2024-06-01 MED ORDER — XCOPRI 200 MG TABLET
ORAL_TABLET | ORAL | 3 refills | 0.00000 days | Status: CP
Start: 2024-06-01 — End: ?

## 2024-06-02 DIAGNOSIS — G40209 Localization-related (focal) (partial) symptomatic epilepsy and epileptic syndromes with complex partial seizures, not intractable, without status epilepticus: Principal | ICD-10-CM

## 2024-06-02 MED ORDER — CLONAZEPAM 1 MG TABLET
ORAL_TABLET | ORAL | 5 refills | 0.00000 days | Status: CP
Start: 2024-06-02 — End: ?

## 2024-06-02 NOTE — Unmapped (Signed)
 Called patient's father to remind him to take patient to get safety labs drawn. Unable to LVM due to full mailbox.     Called patient's mother. She or patient's father will take him to [facility] to get labs done soon.

## 2024-06-08 ENCOUNTER — Ambulatory Visit: Admit: 2024-06-08 | Discharge: 2024-06-09 | Payer: MEDICAID

## 2024-06-08 DIAGNOSIS — Z5181 Encounter for therapeutic drug level monitoring: Principal | ICD-10-CM

## 2024-06-08 LAB — HEPATIC FUNCTION PANEL
ALBUMIN: 4 g/dL (ref 3.4–5.0)
ALKALINE PHOSPHATASE: 45 U/L — ABNORMAL LOW (ref 46–116)
ALT (SGPT): 18 U/L (ref 10–49)
AST (SGOT): 24 U/L (ref <=34)
BILIRUBIN DIRECT: 0.2 mg/dL (ref 0.00–0.30)
BILIRUBIN TOTAL: 0.5 mg/dL (ref 0.3–1.2)
PROTEIN TOTAL: 7.1 g/dL (ref 5.7–8.2)

## 2024-06-08 LAB — CBC W/ AUTO DIFF
BASOPHILS ABSOLUTE COUNT: 0 10*9/L (ref 0.0–0.1)
BASOPHILS RELATIVE PERCENT: 0.6 %
EOSINOPHILS ABSOLUTE COUNT: 0.2 10*9/L (ref 0.0–0.5)
EOSINOPHILS RELATIVE PERCENT: 4.3 %
HEMATOCRIT: 44.9 % (ref 39.0–48.0)
HEMOGLOBIN: 15.3 g/dL (ref 12.9–16.5)
LYMPHOCYTES ABSOLUTE COUNT: 1.8 10*9/L (ref 1.1–3.6)
LYMPHOCYTES RELATIVE PERCENT: 45.9 %
MEAN CORPUSCULAR HEMOGLOBIN CONC: 34.1 g/dL (ref 32.0–36.0)
MEAN CORPUSCULAR HEMOGLOBIN: 32.1 pg (ref 25.9–32.4)
MEAN CORPUSCULAR VOLUME: 94.2 fL (ref 77.6–95.7)
MEAN PLATELET VOLUME: 8.1 fL (ref 6.8–10.7)
MONOCYTES ABSOLUTE COUNT: 0.7 10*9/L (ref 0.3–0.8)
MONOCYTES RELATIVE PERCENT: 19.3 %
NEUTROPHILS ABSOLUTE COUNT: 1.1 10*9/L — ABNORMAL LOW (ref 1.8–7.8)
NEUTROPHILS RELATIVE PERCENT: 29.9 %
PLATELET COUNT: 144 10*9/L — ABNORMAL LOW (ref 150–450)
RED BLOOD CELL COUNT: 4.77 10*12/L (ref 4.26–5.60)
RED CELL DISTRIBUTION WIDTH: 12.7 % (ref 12.2–15.2)
WBC ADJUSTED: 3.8 10*9/L (ref 3.6–11.2)

## 2024-06-09 DIAGNOSIS — D696 Thrombocytopenia, unspecified: Principal | ICD-10-CM

## 2024-06-09 MED ORDER — LEVOCARNITINE 500 MG TABLET
ORAL_TABLET | Freq: Two times a day (BID) | ORAL | 11 refills | 0.00000 days | Status: CP
Start: 2024-06-09 — End: ?

## 2024-06-09 NOTE — Unmapped (Signed)
 Faxed refill request received from pharmacy: [facility]       Medication Requested: Levocarnitine  500 mg  Last Office Visit: 11/26/2023   Next Office Visit: 07/10/2024  Last Prescriber: Amado Ngo,FNP    Nurse refill requirements met? No  If not met, why: Medication not on active SOP    Sent to: Provider for signing  If sent to provider, which provider?: Amado Rockers, FNP

## 2024-06-12 NOTE — Unmapped (Signed)
-----   Message from Odella Coe, MD sent at 06/09/2024  6:27 PM EDT -----  Thanks, I would repeat the CBC next month to monitor platelets. Estefana could you please let patient know? Tahsnk!  ----- Message -----  From: Cecile Krabbe, CPP  Sent: 06/09/2024  11:37 AM EDT  To: Odella Hadassah Coe, MD    FYI only - on Actemra  IV  ----- Message -----  From: Lab, Background User  Sent: 06/08/2024   1:06 PM EDT  To: Krabbe Cecile, CPP

## 2024-06-12 NOTE — Unmapped (Signed)
 Per Dr. Hayes, patient needs to repeat the CBC next month to monitor platelets. Called patient's father. Unable to LVM due to full mailbox.     Called patient's mother. She will make sure to take patient to get a CBC in mid-August.

## 2024-06-22 NOTE — Unmapped (Signed)
 [facility] Refill Coordination Note    Specialty Medication(s) to be Shipped:   Neurology: Actemra     Other medication(s) to be shipped: No additional medications requested for fill at this time     Austin Banks, DOB: 1991-08-15  Phone: [phone] (home)       All above HIPAA information was verified with patient's family member, mom.     Was a Nurse, learning disability used for this call? No    Completed refill call assessment today to schedule patient's medication shipment from the [facility]  [phone]).  All relevant notes have been reviewed.     Specialty medication(s) and dose(s) confirmed: Regimen is correct and unchanged.   Changes to medications: Austin Banks reports no changes at this time.  Changes to insurance: No  New side effects reported not previously addressed with a pharmacist or physician: None reported  Questions for the pharmacist: No    Confirmed patient received a Conservation officer, historic buildings and a Surveyor, mining with first shipment. The patient will receive a drug information handout for each medication shipped and additional FDA Medication Guides as required.       DISEASE/MEDICATION-SPECIFIC INFORMATION        For patients on injectable medications: Patient currently has 0 doses left.  Next injection is scheduled for 08/01.    SPECIALTY MEDICATION ADHERENCE     Medication Adherence    Patient reported X missed doses in the last month: 0  Specialty Medication: ACTEMRA  ACTPEN 162 mg/0.9 mL Pnij (tocilizumab )  Patient is on additional specialty medications: No              Were doses missed due to medication being on hold? No    ACTEMRA  ACTPEN 162 mg/0.9 mL Pnij (tocilizumab ): 0 doses of medicine on hand     REFERRAL TO PHARMACIST     Referral to the pharmacist: Not needed      [facility]     Shipping address confirmed in Epic.     Cost and Payment: Patient has a copay of $4. They are aware and have authorized the pharmacy to charge the credit card on file.    Delivery Scheduled: Yes, Expected medication delivery date: 07/30.     Medication will be delivered via UPS to the prescription address in Epic WAM.    Tell Rozelle   [place] [facility]  Specialty Technician

## 2024-06-23 NOTE — Unmapped (Signed)
 Austin Banks 's ACTEMRA  ACTPEN 162 mg/0.9 mL Pnij (tocilizumab ) shipment will be delayed as a result of credit card ending in [id number] declined     I have reached out to the patient  at [phone] and communicated the delay. We will wait for a call back from the patient to reschedule the delivery.  We have not confirmed the new delivery date.

## 2024-06-24 NOTE — Unmapped (Signed)
 Austin Banks 's ACTEMRA  ACTPEN 162 mg/0.9 mL Pnij (tocilizumab ) shipment will be rescheduled as a result of credit card on file and copay collected.     I have reached out to the patient  at [phone] and communicated the delivery change. We will reschedule the medication for the delivery date that the patient agreed upon.  We have confirmed the delivery date as 06/26/24

## 2024-07-10 ENCOUNTER — Ambulatory Visit: Admit: 2024-07-10 | Discharge: 2024-07-10 | Payer: Medicaid (Managed Care)

## 2024-07-10 DIAGNOSIS — F1290 Cannabis use, unspecified, uncomplicated: Principal | ICD-10-CM

## 2024-07-10 DIAGNOSIS — Z5181 Encounter for therapeutic drug level monitoring: Principal | ICD-10-CM

## 2024-07-10 DIAGNOSIS — D696 Thrombocytopenia, unspecified: Principal | ICD-10-CM

## 2024-07-10 DIAGNOSIS — G40909 Epilepsy, unspecified, not intractable, without status epilepticus: Principal | ICD-10-CM

## 2024-07-10 MED ORDER — NALOXONE 3 MG/ACTUATION NASAL SPRAY
NASAL | 0 refills | 0.00000 days | Status: CP
Start: 2024-07-10 — End: ?

## 2024-07-10 MED ORDER — NALOXONE 4 MG/ACTUATION NASAL SPRAY
0 refills | 0.00000 days
Start: 2024-07-10 — End: ?

## 2024-07-10 NOTE — Unmapped (Signed)
 Seizure prevention  - Depakote  ER 1500 mg qPM  Levocarnitine  500mg  twice a day  - Xcopri  400 mg PM  - Lamotrigine  200 mg BID  -  Clonazepam  1 mg in am and and 2mg  in pm. take extra dose for seizure clusters    Rescue   -  Nayzilam  PRN for convulsive seizures> 3 minutes. Give if seizure is witnessed  -Narcan  nasal spray- give if not responsive and suspect illicit drug use    Amado Rockers FNP  [facility] Neurology Department  Epilepsy Division  [address], [address]  [place]  [place] [address]  Clinic Main line: [phone].   Clinic Fax number: [phone]

## 2024-07-10 NOTE — Unmapped (Addendum)
 Assessment:   Austin Banks is a 33 y.o. 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 The patient has a history of focal seizures with bilateral tonic clonic activity with onset at 32 years of age.  He also has history of polysubstance abuse resulted in ED visits and hospitalization for mental status change and cluster GTCs.  Patient stated he uses cocaine as often as I can get it  and daily this week.     Seizures previously captured on prior EMU admission with clinical symptoms of rhythmic eye fluttering and staring ahead. Notably, some movements captured including whole body low amplitude shaking did not have electrographic correlate. Workup was also notable for positive serum autoimmune panel positive for GAD 65; subsequent malignancy workup returned negative. He resumed Actemra  .     Patient was previously having 1-3 non-convulsive seizures per week and witnessed GTC every 1 to 3 months. The frequency of his seizure is difficulty to obtain as patient lives alone and usually amnesic to his events.  He was last admitted to [facility] in March 2025 for cluster GTC    Seizure Type(s): left fronto-temporal focal seizures with bilateral tonic -clonic activity.  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  All documented time was specific to the E/M visit and does not include any procedures that may have been performed.    Seizure prevention- no change in medication today  - Depakote  ER 1500 mg qPM  Levocarnitine  500mg  twice a day  - Xcopri  400 mg PM  - Lamotrigine  200 mg BID  -  Clonazepam  1 mg in am and and 2mg  in pm. take extra dose for seizure clusters.  Patient has increase seizures without clonazepam   -repeat medication levels and UTS today.   I discussed the risks for respiratory suppression with clonazepam  and illicit drug use  also the risk for status epilepticus removing clonazepam  from his regimen.  Narcan  prescription sent.  Parents stated understanding of this difficult decision.     Rescue   -  Nayzilam  PRN for convulsive seizures> 3 minutes. Give if seizure is witnessed  -Narcan  nasal spray- give if not responsive and suspect illicit drug use    Labs: VPA 02/02/24- 72.3- stable,  LTG 4.2- slightly low compare to his typical levels. 06/09/2024- cBC normal, hepatic function panel was unremarkable    Encouraged patient's to avoid using crack or cocaine as it will increase his seizure frequency.  I recommended seizure precautions with regards to avoiding unsupervised water recreational activity, climbing or working at heights, operation of heavy or dangerous machinery, caution around fire and sources of high heat, as well as any other activity which could put you at danger in case of a seizure. Taking a bath is generally not recommended for a patient with uncontrolled epilepsy. I also reviewed the [place] DMV law and recommended to not drive unless approved by the [facility].    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 He is accompanied by his parents who provides history as patient is too sedated today.     He has had two recent emergency department visits. The first was on January 04, 2024, due to a mental status change, where cocaine and marijuana were found in his urine. The second visit was on February 02, 2024, for multiple seizures, with methamphetamine, cocaine, and marijuana detected in his urine. His medication levels were slightly low for lamotrigine  but normal for Depakote . Liver function and CBC were normal.    He uses cocaine daily, with the last use being the night before the visit. He last used marijuana three days ago and denies recent methamphetamine use. He lives alone but now has assistance for aid to come to his home a few times a week. He has a disrupted sleep pattern, staying up at night and sleeping during the day. His parents confirm that his current state of drowsiness is his normal during the day and he is more alert at night.     He is currently taking lamotrigine  and Depakote  for seizure management. He also takes clonazepam . He is on folic acid  as a vitamin supplement due to poor nutrition.          Seizure risk factors:He  was the product of an uncomplicated pregnancy and delivery.  The patient had a normal development.There is no history febrile seizure as an infant or child, meningitis, encephalitis, known brain structural abnormality,or significant head trauma.  There is no family history of seizures or epilepsy.       Onset: The patient had his first seizure at age 12  Number of seizure types: two     Seizure/seizure-like event  Description: focal onset with secondary generalization  - Semiology: Begins with right eye deviation, left facial clonic jerking, eye blinking, mumbling. LUE flexed with coordinated back and forth movements of his bilateral lower extremities and drool from the left side of his mouth. All movement abruptly ceases at ictal offset with pelvic thrusting. Family also describes hand automatisms after shaking at times. Post-ictal confusion lasting up to 20 minutes.   Frequency: Previously 3 times per week, now 1 every 3-4 months     Seizure/seizure-like event  Description: focal unaware  - Semiology: Behavior arrest and arrest with confusion afterwards. Attempts to remove clothes afterwards  Frequency: Previously twice per month, now unclear      Work up done   EEG has shown  vEEG/ EMU evaluation: in June 2021  SUMMARY: This an abnormal EEG due to   - Background slowing   - Epileptiform activity seen independently in the right and left, maximal right temporal >frontal-central regions   - Electro clinical seizures, with less well defined ictal  patterns, although with suggestion of left and right  frontal-temporal onsets.   CLINICAL INTERPRETATION   This EEG is consistent with a moderate a diffuse cerebral  dysfunction which could be secondary to toxic, metabolic, or  primary neuronal disorder. The epileptiform activity suggests  a   potential for focal onset seizures involving the right and left,  maximal right temporal regions. Electro clinical seizures, of  frontal semiology and likely involving the left and right head  regions were seen as described above.  Of note, there are body  movements seen that are not epileptic in nature.  Brain MRI imaging on 04/30/2020:  unremarkable.        Other medical issues:   1. Driving: no  Serum  GAD antibodies (40 nmol/l).  1. CT Chest/Abdomen/Pelvis, : Unremarkable   2. Testicular Ultra sound,Unremarkable   3. GAD in CSF was high at 54.7 nmol/l (normal <= 0.02)  CSF studies (08/25/2020): Albumin  12.9 WNL, CSF IgG 2.5 WNL, autoimmune panel with gad 65 elevated at 54.7, CSF cell count with differential largely unremarkable, glucose 59 WNL, protein 24 WNL, culture with no organisms or PMNs. CSF oligoclonal bands with 4 or more bands unique to the CSF however the IgG index was not raised   4. Supplemental blood work: 08/25/2020: Parietal cell antibody IgG elevated at 55.4, thyroid peroxidase antibody elevated at 21.86  5. Continues follow up with neuroimmunology - getting IVIG - cellcept  started in Jan 2022 but stopped due to significant worsening of vomiting     Past Medical History: He  has a past medical history of Fall, Hepatitis B surface antigen positive, Schizophrenia, Seizure, TBI (traumatic brain injury) (CMS-HCC), and Vitamin D deficiency.     Past Surgical History:    Past Surgical History:   Procedure Laterality Date    MANDIBLE FRACTURE SURGERY       Family History: His family history includes Cancer in his maternal aunt, maternal grandmother, and paternal grandfather; Diabetes in his father; Hypertension in his father and mother.     Social history: He  reports that he has been smoking cigarettes. He has a 10 pack-year smoking history. He has never used smokeless tobacco. He reports that he does not currently use alcohol. He reports current drug use. Frequency: 7.00 times per week. Drugs: Marijuana and Cocaine.        Medications: He has a current medication list which includes the following prescription(s): cetirizine , clonazepam , divalproex  er, empty container, folic acid , haloperidol , lamotrigine , levocarnitine , nayzilam , multivit-min-iron  fum-folic ac, actemra  actpen, xcopri , and [DISCONTINUED] vimpat .   Current Outpatient Medications   Medication Sig Dispense Refill    cetirizine  (ZYRTEC ) 10 MG tablet Take 1 tablet (10 mg total) by mouth every morning.      clonazePAM  (KLONOPIN ) 1 MG tablet TAKE 1 TABLET BY MOUTH IN THE MORNING and TAKE 2 TABLETS AT BEDTIME (PER patient) may take additional tablets after a big seizure or 2 small one. no more than 4 tablets per day 120 tablet 5    divalproex  ER (DEPAKOTE  ER) 500 MG extended released 24 hr tablet Take 3 tablets (1,500 mg total) by mouth nightly. 90 tablet 11    empty container Misc Use as directed to dispose of injections 1 each 3    folic acid  (FOLVITE ) 1 MG tablet Take 1 tablet (1 mg total) by mouth daily. 30 tablet 5    haloperidol  (HALDOL ) 5 MG tablet Take 3 tablets (15 mg total) by mouth nightly.      lamoTRIgine  (LAMICTAL ) 200 MG tablet Take 1 tablet (200 mg total) by mouth two (2) times a day. 60 tablet 11    levocarnitine  500 mg Tab Take 1 tablet (500 mg) by mouth Two (2) times a day. 60 tablet 11    midazolam  (  NAYZILAM ) 5 mg/spray (0.1 mL) Spry 1 spray (5 mg) into 1 nostril for a seizure 3 minutes long; May give 2nd dose after 10 minutes for recurrent seizures. do not give a second dose if the patient has trouble breathing, MAX- 2 sprays in 72hrs and 4 sprays a week. 4 each 5    multivit-min-iron  fum-folic ac 7.5 mg iron -400 mcg Tab Take 1 tablet by mouth daily.      tocilizumab  (ACTEMRA  ACTPEN) 162 mg/0.9 mL PnIj Inject the contents of 1 pen (162 mg) under the skin every seven (7) days. 3.6 mL 3    XCOPRI  200 mg tablet TAKE 2 TABLETS BY MOUTH AT BEDTIME per patient 60 tablet 3     No current facility-administered medications for this visit.       Allergies: He is allergic to lacosamide .     Review of Systems:    Full 10 Points of Review of System are reviewed and negative except stated above in HPI    Constitutional:  No significant change in weight.  No difficulty sleeping or excessive daytime sleepiness  HEENT: No hearing problems or sinus problems.  Ophthalmologic:  No vision problems.  Cardiovascular: No history of hypertension, chest pain, or hyperlipidemia. Pulmonary:  No history of hemoptysis or asthma. No shortness of breath GI:  No significant diarrhea or constipation. No rectal bleeding GU:  No difficulty voiding.  Endocrine:  No history of diabetes or thyroid problems. Musculoskeletal: No joint or back pain.  Psychiatric:  No history of anxiety or depression.  Skin:  No significant rashes or lesions.  Hematologic:  No bleeding problems    Physical Examination:    Vitals signs :   Vitals:    07/10/24 1331   BP: 101/62   BP Site: L Arm   BP Position: Sitting   BP Cuff Size: Small   Pulse: 59   Temp: 35.9 ??C (96.6 ??F)   TempSrc: Temporal   Weight: 68 kg (150 lb)        PHQ-9: not done  GAD-7:  not done    General: No acute distress, Well developed/groomed/nourished   ENT: drooling while sleep.    Extremities: No bilateral cyanosis, clubbing or edema.   Musculo Skeletal: No joint tenderness  Neurological:   MENTAL STATUS: excessively drowsy.  Sleep soundly on exam table.  Able to answer questions appropriately when woken up with eyes half open and not focusing on provider.  Speech is slurred, but he was asking parents to take him our for lunch after visit.  Also stated he did not want to provide a urine sample.  Oriented to person, place, day of week and year.      CRANIAL NERVES: eye lids half shut. Extraocular movements with nystagmus.  There was no evidence for facial asymmetry. Tongue in the midline.  MOTOR: able to move from exam table to wheel chair without problem, though he is too sedated to test ambulation safely.

## 2024-07-10 NOTE — Unmapped (Signed)
 Refill request received from patient.      Medication Requested: Naloxone  3 mg Nasal Spray  Last Office Visit: 07/10/2024   Next Office Visit: 03/18/2025  Last Prescriber: Amado Rockers    Nurse refill requirements met? No  If not met, why: Controlled medication    Sent to: Provider for signing  If sent to provider, which provider?: Linh Ngo

## 2024-07-14 MED ORDER — NALOXONE 4 MG/ACTUATION NASAL SPRAY
NASAL | 0 refills | 0.00000 days | Status: CP
Start: 2024-07-14 — End: 2024-07-14

## 2024-07-14 NOTE — Unmapped (Signed)
 Addended by: Lorcan Shelp, AMADO on: 07/14/2024 08:43 AM     Modules accepted: Orders

## 2024-07-16 NOTE — Unmapped (Signed)
 [facility] Clinical Assessment & Refill Coordination Note    Austin Banks, DOB: 13-Apr-1991  Phone: [phone] (home)     All above HIPAA information was verified with patient's family member, mom.     Was a Nurse, learning disability used for this call? No    Specialty Medication(s):   Neurology: Actemra      Current Medications[1]     Changes to medications: Austin Banks reports no changes at this time.    Medication list has been reviewed and updated in Epic: Yes    Allergies[2]    Changes to allergies: No    Allergies have been reviewed and updated in Epic: Yes    SPECIALTY MEDICATION ADHERENCE     Actemra  162 mg/0.56ml: 1 doses of medicine on hand       Medication Adherence    Patient reported X missed doses in the last month: 0  Specialty Medication: Actemra   Patient is on additional specialty medications: No  Informant: mother          Specialty medication(s) dose(s) confirmed: Regimen is correct and unchanged.     Are there any concerns with adherence? No    Adherence counseling provided? Not needed    CLINICAL MANAGEMENT AND INTERVENTION      Clinical Benefit Assessment:    Do you feel the medicine is effective or helping your condition? Yes    Clinical Benefit counseling provided? Not needed    Adverse Effects Assessment:    Are you experiencing any side effects? No    Are you experiencing difficulty administering your medicine? 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 For example, brushing your teeth or getting up in the morning. 1    Have you discussed this with your provider? Not needed    Acute Infection Status:    Acute infections noted within Epic:  No active infections    Patient reported infection: None    Therapy Appropriateness:    Is therapy appropriate based on current medication list, adverse reactions, adherence, clinical benefit and progress toward achieving therapeutic goals? Yes, therapy is appropriate and should be continued     Clinical Intervention:    Was an intervention completed as part of this clinical assessment? No    DISEASE/MEDICATION-SPECIFIC INFORMATION      For patients on injectable medications: Next injection is scheduled for 07/17/24.    Other Neurological Condtions: Not Applicable    PATIENT SPECIFIC NEEDS     Does the patient have any physical, cognitive, or cultural barriers? No    Is the patient high risk? No    Does the patient require physician intervention or other additional services (i.e., nutrition, smoking cessation, social work)? No    Does the patient have an additional or emergency contact listed in their chart? Yes    SOCIAL DETERMINANTS OF HEALTH     At the [facility], we have learned that life circumstances - like trouble affording food, housing, utilities, or transportation can affect the health of many of our patients.   That is why we wanted to ask: are you currently experiencing any life circumstances that are negatively impacting your health and/or quality of life? Patient declined to answer    Social Drivers of  Health     Food Insecurity: Food Insecurity Present (10/09/2023)    Received from [facility]    Hunger Vital Sign     Within the past 12 months, you worried that your food would run out before you got the money to buy more.: Often true     Within the past 12 months, the food you bought just didn't last and you didn't have money to get more.: Sometimes true   Tobacco Use: High Risk (07/10/2024)    Patient History     Smoking Tobacco Use: Every Day     Smokeless Tobacco Use: Never     Passive Exposure: Not on file   Transportation Needs: No Transportation Needs (10/09/2023)    Received from [facility] - Transportation     In the past 12 months, has lack of transportation kept you from medical appointments or from getting medications?: No     Lack of Transportation (Non-Medical): No   Alcohol Use: Not At Risk (07/01/2024)    Received from [facility]    AUDIT-C     Q1: How often do you have a drink containing alcohol?: Never     Q2: How many drinks containing alcohol do you have on a typical day when you are drinking?: Patient does not drink     Frequency of Binge Drinking: Not on file   Housing: Unknown (10/09/2023)    Received from [facility]    Housing Stability Vital Sign     In the last 12 months, was there a time when you were not able to pay the mortgage or rent on time?: No     Number of Times Moved in the Last Year: Not on file     At any time in the past 12 months, were you homeless or living in a shelter (including now)?: No   Physical Activity: Not on file   Utilities: Not At Risk (10/09/2023)    Received from [facility] Utilities     Threatened with loss of utilities: No   Stress: Not on file   Interpersonal Safety: Not on file   Substance Use: Not on file (10/06/2023)   Intimate Partner Violence: Not on file   Social Connections: Not on file   Financial Resource Strain: Low Risk  (10/09/2023)    Received from [facility]    Overall Financial Resource Strain (CARDIA)     Difficulty of Paying Living Expenses: Not very hard   Health Literacy: Not on file   Internet Connectivity: Not on file       Would you be willing to receive help with any of the needs that you have identified today? Not applicable       SHIPPING     Specialty Medication(s) to be Shipped:   Neurology: Actemra     Other medication(s) to be shipped: No additional medications requested for fill at this time    Specialty Medications not needed at this time: N/A     Changes to insurance: No    Cost and Payment: Patient has a copay of $4. They are aware and have authorized the pharmacy to charge the credit card on file.    Delivery Scheduled: Yes, Expected medication delivery date: 07/22/24.     Medication will be delivered via UPS to the confirmed prescription address in [facility].    The patient will receive  a drug information handout for each medication shipped and additional FDA Medication Guides as required.  Verified that patient has previously received a Conservation officer, historic buildings and a Surveyor, mining.    The patient or caregiver noted above participated in the development of this care plan and knows that they can request review of or adjustments to the care plan at any time.      All of the patient's questions and concerns have been addressed.    Vernell VEAR Gull, PharmD   [facility] Specialty Pharmacist       [1]   Current Outpatient Medications   Medication Sig Dispense Refill    cetirizine  (ZYRTEC ) 10 MG tablet Take 1 tablet (10 mg total) by mouth every morning.      clonazePAM  (KLONOPIN ) 1 MG tablet TAKE 1 TABLET BY MOUTH IN THE MORNING and TAKE 2 TABLETS AT BEDTIME (PER patient) may take additional tablets after a big seizure or 2 small one. no more than 4 tablets per day 120 tablet 5    divalproex  ER (DEPAKOTE  ER) 500 MG extended released 24 hr tablet Take 3 tablets (1,500 mg total) by mouth nightly. 90 tablet 11    empty container Misc Use as directed to dispose of injections 1 each 3    folic acid  (FOLVITE ) 1 MG tablet Take 1 tablet (1 mg total) by mouth daily. 30 tablet 5    haloperidol  (HALDOL ) 5 MG tablet Take 3 tablets (15 mg total) by mouth nightly.      lamoTRIgine  (LAMICTAL ) 200 MG tablet Take 1 tablet (200 mg total) by mouth two (2) times a day. 60 tablet 11    levocarnitine  500 mg Tab Take 1 tablet (500 mg) by mouth Two (2) times a day. 60 tablet 11    midazolam  (NAYZILAM ) 5 mg/spray (0.1 mL) Spry 1 spray (5 mg) into 1 nostril for a seizure 3 minutes long; May give 2nd dose after 10 minutes for recurrent seizures. do not give a second dose if the patient has trouble breathing, MAX- 2 sprays in 72hrs and 4 sprays a week. 4 each 5 multivit-min-iron  fum-folic ac 7.5 mg iron -400 mcg Tab Take 1 tablet by mouth daily.      naloxone  (NARCAN ) 4 mg nasal spray Give one nasal spray for mental status change due to excessive illicit drug use and call 911 2 each 0    tocilizumab  (ACTEMRA  ACTPEN) 162 mg/0.9 mL PnIj Inject the contents of 1 pen (162 mg) under the skin every seven (7) days. 3.6 mL 3    XCOPRI  200 mg tablet TAKE 2 TABLETS BY MOUTH AT BEDTIME per patient 60 tablet 3     No current facility-administered medications for this visit.   [2]   Allergies  Allergen Reactions    Lacosamide  Other (See Comments)     Psychosis    Hallucinations

## 2024-07-21 MED FILL — ACTEMRA ACTPEN 162 MG/0.9 ML SUBCUTANEOUS PEN INJECTOR: SUBCUTANEOUS | 28 days supply | Qty: 3.6 | Fill #1

## 2024-08-03 NOTE — Unmapped (Signed)
 Patient's parents stopped by the clinic and requested an order for the Epimonitor device.    The Epimonitor has been approved by [place]. The order needs to be faxed to  [facility], attn: Marko Sorrel OT, fax number [phone].    Thanks

## 2024-08-06 NOTE — Unmapped (Signed)
 FAX CONFIRMATION     Orders/documents that were faxed:  Epimonitor Referral  Facility/location it was faxed to: [facility]  attn: Monique  Phone:  Fax: [phone]  Time sent: 3:05 pm  Fax confirmation received and document shredded      Updated patient's mother that Epimonitor prescription was faxed to [facility].

## 2024-08-13 NOTE — Unmapped (Signed)
 Called Austin Banks with [facility] and informed her the [employer] form has been completed and sent to provider. The provider is out of the clinic until 08/21/24 and will complete the form when she returns.    Also informed Marko, we do not file claims for Epimonitors through insurance.  The patient has to pay for this upfront and then request reimbursement from insurance.     Austin said [facility] is planning to pay for this device for the patient, but has to have insurance denial first.

## 2024-08-18 NOTE — Unmapped (Signed)
 [facility] Refill Coordination Note    Specialty Medication(s) to be Shipped:   Neurology: ACTEMRA  ACTPEN 162 mg/0.9 mL Pnij subcutaneous injection (tocilizumab )    Other medication(s) to be shipped: No additional medications requested for fill at this time    Specialty Medications not needed at this time: N/A     Austin Banks, DOB: 1991/03/24  Phone: [phone] (home)       All above HIPAA information was verified with patient's family member, mom.     Was a Nurse, learning disability used for this call? No    Completed refill call assessment today to schedule patient's medication shipment from the [facility]  [phone]).  All relevant notes have been reviewed.     Specialty medication(s) and dose(s) confirmed: Regimen is correct and unchanged.   Changes to medications: Andrea reports no changes at this time.  Changes to insurance: No  New side effects reported not previously addressed with a pharmacist or physician: None reported  Questions for the pharmacist: No    Confirmed patient received a Conservation officer, historic buildings and a Surveyor, mining with first shipment. The patient will receive a drug information handout for each medication shipped and additional FDA Medication Guides as required.       DISEASE/MEDICATION-SPECIFIC INFORMATION        For patients on injectable medications: Next injection is scheduled for 9/26.    SPECIALTY MEDICATION ADHERENCE     Medication Adherence    Patient reported X missed doses in the last month: 0  Specialty Medication: tocilizumab : ACTEMRA  ACTPEN 162 mg/0.9 mL Pnij subcutaneous injection  Patient is on additional specialty medications: No  Informant: mother              Were doses missed due to medication being on hold? No    ACTEMRA  ACTPEN 162 mg/0.9 mL Pnij subcutaneous injection (tocilizumab ): 0 doses of medicine on hand       REFERRAL TO PHARMACIST     Referral to the pharmacist: Not needed      [facility]     Shipping address confirmed in Epic.     Cost and Payment: Patient has a copay of $4. They are aware and have authorized the pharmacy to charge the credit card on file.    Delivery Scheduled: Yes, Expected medication delivery date: 9/25.     Medication will be delivered via UPS to the prescription address in Epic WAM.    Jeoffrey JAYSON Sherra [facility]

## 2024-08-19 MED FILL — ACTEMRA ACTPEN 162 MG/0.9 ML SUBCUTANEOUS PEN INJECTOR: SUBCUTANEOUS | 28 days supply | Qty: 3.6 | Fill #2

## 2024-08-25 NOTE — Unmapped (Signed)
 [employer]  [employer] [facility] [employer] at [facility]  Multiple Sclerosis/Neuroimmunology Division  Elspeth Devon, MD  MS/Neuroimmunology Fellow    DATE OF VISIT: 08/27/2024    Re:  Austin Banks  [address]  Austin Banks  [place] [address]  MRN: [id number]  DOB: 07/14/1991      Visit: FU visit   Last seen by [name], MD 01/2024      REASON FOR VISIT: Austin Banks, a 33 y.o. 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 Patient was recently seen at the hospital on 02/02/2024 and admitted for overnight observation after multiple breakthrough seizures. He is accompanied today in clinic by his parents.     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                                                                                                                               ?? 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 Epilepsy is managed by [name] epilepsy. His brain MRI in June 2021 and November 2021 did not show parenchymal brain lesions and no evidence of mesial temporal sclerosis. EEG done in June 2021 was suggestive of a potential for focal onset seizures involving both the right and left temporal regions. GAD65 antibodies were detected in both CSF and serum and positive CSF OCBS.  CT abdomen/pelvis/chest and US  scrotum were negative for malignancy in July 2021. Given the long history of seizures, paraneoplastic process is less likely. The patient also has other autoimmune conditions commonly asociated with GAD65 autoimmunity (autoimmune gastritis and autoimmune thyroid disease). Additionally, patient has a history of psychosis and body stiffening, both of which can be seen with GAD65 spectrum disease.    Treatment history:  - Started on IVIG on 09/12/2020 1g/kg monthly >> until 02/27/2022. Escalated therapy due to increased frequency of seizures and behavioral control issues  - January 2022: tried on Cellcept  but had adverse side effects of vomiting   - Rituxan from 06/2021 to 03/18/2022 and then transitioned to Cytoxan  due to poor seizure control. (Rituximab induction doses were on 07/11/2021 and 07/25/2021).  - IV Cytoxan  started on 05/24/2022. Did not receive 2nd dose and delay in re-starting.   - Cytoxan  restarted on  08/23/2023 and continued on this for a total of approximately 10 treatments   - Switched to Actemra  at the end of January 2025     Patient is here today for routine follow up. Both the patient  and parents (although patient does not live with parents) deny any new or worsening symptoms. They do report that he has persistent hallucinations (constant) and intermittent nystagmus. He remains on weekly Actemra  162 mg subcutaneous injection, and patient and family deny side effects. Additionally, family and patient state that his seizure frequency seems stable, although it is difficult to know for sure. Current ASM regimen includes VPA with levocarnitine , Xcopri , and Lamictal . Patient denies any side effects of these meds.    Altogether it appears that patient is doing well. Will obtain GAD 65 titer and repeat imaging to qualify efficacy of current immunotherapy. Will refer to neuro-ophthalmology at patient's request.      Plan:     - LABS: GAD 65 titer    - IMAGING: repeat MRI brain w/wo contrast    - IMMUNOTHERAPY TREATMENT:   erenest Actemra  162 mg subcutaneous injection, qweekly.     - OTHER TREATMENT:   erenest anti-seizure medications per epilepsy team:    Depakote  ER 1500 mg nightly,   Levocarnitine  500mg  BID  Xcopri  400 mg PM  Lamotrigine  200 mg BID  Clonazepam  1 mg AM/ 2 mg PM. Use Clonazepam  and Nayzilam  PRN for seizure clusters or GTC's lasting longer than 3 minutes     - REFERRAL:  ^neuro-ophthalmology.    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 Recent overnight admission on 02/02/24 due to breakthrough cluster of seizures - provoked in the setting of crystal meth and crack cocaine use.     INTERVAL HISTORY since last visit with [name], MD on 01/2024    Patient is accompanied by his dad. Mom is on the phone. Family reports that he continues to have hallucinations and seizures. They report that these symptoms are stable compared to prior. Hallucination content is unchanged as well (people with wings). And lastly seizure semiology is unchanged (see 06/2024 office note from epileptologist for further seizure history details).     Patient denies any recent body stiffening episodes.    Patient denies any side effects from medications.    Notably throughout encounter patient appears to fall asleep multiple times.  ............................................................................................................................................SABRA  DIAGNOSTIC STUDIES / REVIEW OF RECORDS:    Prior medical records: reviewed personally, please see assessment    08/25/2020 CSF: Tube 1: 1 nucleated cell + 1 RBC. Tube 4: 1 nucleated cell + 0 RBC's. IgG index wnl 0.6. 4 or more positive oligoclonal bands. Encephalopathy autoimmune/paraneoplastic panel: GAD-65 elevated at 54.7  Serum GAD-65: elevated at 40.4 on 05/03/20 >> increased to 163 on 03/14/22 >> elevated at 145 on 04/13/22  .............................................................................................................................................      Past Medical History:   Diagnosis Date    Fall     from medications    Hepatitis B surface antigen positive     Schizophrenia (CMS-HCC)     Seizure    (CMS-HCC)     TBI (traumatic brain injury) (CMS-HCC)     Vitamin D deficiency        Allergies   Allergen Reactions    Lacosamide  Other (See Comments)     Psychosis    Hallucinations  Current Outpatient Medications   Medication Sig Dispense Refill    cetirizine  (ZYRTEC ) 10 MG tablet Take 1 tablet (10 mg total) by mouth every morning.      clonazePAM  (KLONOPIN ) 1 MG tablet TAKE 1 TABLET BY MOUTH IN THE MORNING and TAKE 2 TABLETS AT BEDTIME (PER patient) may take additional tablets after a big seizure or 2 small one. no more than 4 tablets per day 120 tablet 5    divalproex  ER (DEPAKOTE  ER) 500 MG extended released 24 hr tablet Take 3 tablets (1,500 mg total) by mouth nightly. 90 tablet 11    folic acid  (FOLVITE ) 1 MG tablet Take 1 tablet (1 mg total) by mouth daily. 30 tablet 5    haloperidol  (HALDOL ) 5 MG tablet Take 3 tablets (15 mg total) by mouth nightly.      lamoTRIgine  (LAMICTAL ) 200 MG tablet Take 1 tablet (200 mg total) by mouth two (2) times a day. 60 tablet 11    levocarnitine  500 mg Tab Take 1 tablet (500 mg) by mouth Two (2) times a day. 60 tablet 11    multivit-min-iron  fum-folic ac 7.5 mg iron -400 mcg Tab Take 1 tablet by mouth daily.      tocilizumab  (ACTEMRA  ACTPEN) 162 mg/0.9 mL PnIj subcutaneous injection Inject the contents of 1 pen (162 mg) under the skin every seven (7) days. 3.6 mL 3    XCOPRI  200 mg tablet TAKE 2 TABLETS BY MOUTH AT BEDTIME per patient 60 tablet 3    empty container Misc Use as directed to dispose of injections 1 each 3    midazolam  (NAYZILAM ) 5 mg/spray (0.1 mL) Spry 1 spray (5 mg) into 1 nostril for a seizure 3 minutes long; May give 2nd dose after 10 minutes for recurrent seizures. do not give a second dose if the patient has trouble breathing, MAX- 2 sprays in 72hrs and 4 sprays a week. 4 each 5    naloxone  (NARCAN ) 4 mg nasal spray Give one nasal spray for mental status change due to excessive illicit drug use and call 911 2 each 0     No current facility-administered medications for this visit.         Past Surgical History:   Procedure Laterality Date    MANDIBLE FRACTURE SURGERY         Social History     Socioeconomic History    Marital status: Single     Spouse name: None    Number of children: None    Years of education: None    Highest education level: None   Tobacco Use    Smoking status: Every Day     Current packs/day: 1.00     Average packs/day: 1 pack/day for 10.0 years (10.0 ttl pk-yrs)     Types: Cigarettes    Smokeless tobacco: Never    Tobacco comments:     10-20cpd   Vaping Use    Vaping status: Never Used   Substance and Sexual Activity    Alcohol use: Not Currently     Comment: rare    Drug use: Yes     Frequency: 7.0 times per week     Types: Marijuana, Cocaine   Social History Narrative    Updated May 2023        PSYCHIATRIC HX:     -Prior Dx: Schizophrenia. Seizures started ~age 36 around same time of head injury (unknown mechanism), psychotic symptoms ~age 34 by chart review. Seizures intractible for many years, in 2021  was found to be GAD65+ in serum and CSF, has been receiving IVIG + cellcept  or rituximab since then. Historically in post-ictal state has had more psychotic symptoms/agitation.     -Hospitalizations: 10+ prior including at [facility]; most recent at [facility] in 2021, involuntarily on 3NSH due to psychosis. Last known in Nov 2022 at [facility] for delusions/AH    -Suicide attempts: denies     -Non-suicidal self-injury: denies      -Medication Trials: Zyprexa (stopped due to fear of increased seizure risk, was on doses as high as 40mg ), Risperdal  (and Zyprexa and risperidone  together at times), Geodon, abilify, Lamictal , Invega Sustenna x 2 months and stopped at [facility]. In spring 2023 admitted to Neuro on risperidone  3mg  BID, while hospitalized changed to haldol  5mg  BID (May 2023)    -Med compliance Hx: fair     -Current treatment: ACT team through [facility]    -Past Psych: [facility], [facility] in [place]. [facility]- many group homes and different ACT/CST in past        SUBSTANCE ABUSE HX:    # Marijuana: several times a day since age 43, reported use as recently as 2022    # Cocaine: prior use, occasionally. Last reported use ~2012    # Alcohol: Patient denies recent use. No known hx of Sz/DT/Complicated WD Hx, in 2016 drinking 2-3 drinks a day         Social Hx:    -Current Living Environment: Currently living with parents; previously at group homes; [facility] in [place]    -Guardian: Father; Austin Banks (home [phone]), cell [phone])    -Sexual orientation: openly homosexual to family from 2016, also has a child     -Education/School Performance: High school, regular classes     -Income/Employment: disability check; -dad is his payee    -Legal/School Discipline: breaking and entering, assault charges     -Abuse/Neglect/Trauma/DV: denies     -Violence (perp): previous assault charges     -Weapons Access: denies guns        FAMILY HX    -Mental Illness: denies     -Suicide: denies     -Substance Abuse: extensive history alcohol and recreational drug use     Social Drivers of Psychologist, prison and probation services Strain: Low Risk  (10/09/2023)    Received from [facility]    Overall Financial Resource Strain (CARDIA)     Difficulty of Paying Living Expenses: Not very hard   Food Insecurity: Food Insecurity Present (10/09/2023)    Received from [facility]    Hunger Vital Sign     Within the past 12 months, you worried that your food would run out before you got the money to buy more.: Often true     Within the past 12 months, the food you bought just didn't last and you didn't have money to get more.: Sometimes true   Transportation Needs: No Transportation Needs (10/09/2023)    Received from [facility] - Transportation     In the past 12 months, has lack of transportation kept you from medical appointments or from getting medications?: No     Lack of Transportation (Non-Medical): No   Housing: Unknown (10/09/2023)    Received from [facility]    Housing Stability Vital Sign     In the last 12 months, was there a time when you were  not able to pay the mortgage or rent on time?: No     At any time in the past 12 months, were you homeless or living in a shelter (including now)?: No       Family History   Problem Relation Age of Onset    Hypertension Mother     Hypertension Father     Diabetes Father     Cancer Maternal Aunt     Cancer Maternal Grandmother     Cancer Paternal Grandfather     Seizures Neg Hx         Review of Systems:  Otherwise negative unless stated in HPI/assessment     Objective:     Physical Exam:  Blood pressure 111/77, pulse 86, temperature 36.7 ??C (98.1 ??F), temperature source Temporal, height 185.4 cm (6' 1), weight 68.8 kg (151 lb 9.6 oz).     General Appearance: ill appearing, poor overall hygiene and malodorous.   Requires frequent re-orientation to keep his eyes open; somnolent     NEUROLOGICAL EXAMINATION - limited given difficulty cooperating.    General:  Alert and oriented to self. States name correctly.   Speech is dysarthric. Illogical thought pattern.     Cranial Nerves:     III, IV, VI- multi directional nystagmus, 4-5 beats with horizontal end gaze    Motor Exam:   Moving all extremities spontaneously and anti gravity     Reflexes  2+ throughout    Coordination:   No dysmetria when reaching for objects bilaterally.    Gait:  Walks without assistance, normal casual gait .........................................................................................................................................SABRA    VISIT SUMMARY:  Austin Banks, a 33 y.o. male  presented intractable epilepsy and recent ED visit.       I personally spent 47 minutes face-to-face and non-face-to-face in the care of this patient, which includes all pre, intra, and post visit time on the date of service.  All documented time was specific to the E/M visit and does not include any procedures that may have been performed.      I took a detailed history of the present illness from patient's parents and limited history from the patient. Relevant details on past medical history, family history, and social history were reviewed.   I personally reviewed  patient's prior medical records, radiology reports, and laboratory work.   I performed medication reconciliation  The plan was discussed with patient's parents who were amenable to the recommendations as detailed above        Case was discussed with attending physician, Dr. Odella Coe     Thank you for the opportunity to contribute to the care of Austin Banks.

## 2024-08-27 ENCOUNTER — Ambulatory Visit: Admit: 2024-08-27 | Discharge: 2024-08-28 | Payer: Medicaid (Managed Care)

## 2024-08-27 DIAGNOSIS — R569 Unspecified convulsions: Principal | ICD-10-CM

## 2024-08-27 DIAGNOSIS — G0481 Other encephalitis and encephalomyelitis: Principal | ICD-10-CM

## 2024-08-27 DIAGNOSIS — R768 Other specified abnormal immunological findings in serum: Principal | ICD-10-CM

## 2024-08-27 LAB — CBC W/ AUTO DIFF
BASOPHILS ABSOLUTE COUNT: 0 10*9/L (ref 0.0–0.1)
BASOPHILS RELATIVE PERCENT: 1 %
EOSINOPHILS ABSOLUTE COUNT: 0.4 10*9/L (ref 0.0–0.5)
EOSINOPHILS RELATIVE PERCENT: 10 %
HEMATOCRIT: 46.7 % (ref 39.0–48.0)
HEMOGLOBIN: 16.2 g/dL (ref 12.9–16.5)
LYMPHOCYTES ABSOLUTE COUNT: 1.6 10*9/L (ref 1.1–3.6)
LYMPHOCYTES RELATIVE PERCENT: 43.5 %
MEAN CORPUSCULAR HEMOGLOBIN CONC: 34.6 g/dL (ref 32.0–36.0)
MEAN CORPUSCULAR HEMOGLOBIN: 32.9 pg — ABNORMAL HIGH (ref 25.9–32.4)
MEAN CORPUSCULAR VOLUME: 95.1 fL (ref 77.6–95.7)
MEAN PLATELET VOLUME: 8 fL (ref 6.8–10.7)
MONOCYTES ABSOLUTE COUNT: 0.7 10*9/L (ref 0.3–0.8)
MONOCYTES RELATIVE PERCENT: 17.6 %
NEUTROPHILS ABSOLUTE COUNT: 1.1 10*9/L — ABNORMAL LOW (ref 1.8–7.8)
NEUTROPHILS RELATIVE PERCENT: 27.9 %
NUCLEATED RED BLOOD CELLS: 0 /100{WBCs} (ref <=4)
PLATELET COUNT: 166 10*9/L (ref 150–450)
RED BLOOD CELL COUNT: 4.92 10*12/L (ref 4.26–5.60)
RED CELL DISTRIBUTION WIDTH: 12.9 % (ref 12.2–15.2)
WBC ADJUSTED: 3.8 10*9/L (ref 3.6–11.2)

## 2024-08-27 LAB — HEPATIC FUNCTION PANEL
ALBUMIN: 4.1 g/dL (ref 3.4–5.0)
ALKALINE PHOSPHATASE: 48 U/L (ref 46–116)
ALT (SGPT): 15 U/L (ref 10–49)
AST (SGOT): 21 U/L (ref <=34)
BILIRUBIN DIRECT: 0.2 mg/dL (ref 0.00–0.30)
BILIRUBIN TOTAL: 0.5 mg/dL (ref 0.3–1.2)
PROTEIN TOTAL: 7.3 g/dL (ref 5.7–8.2)

## 2024-08-27 LAB — VALPROIC ACID LEVEL, TOTAL: VALPROIC ACID TOTAL: 72.6 ug/mL (ref 50.0–100.0)

## 2024-08-27 NOTE — Unmapped (Addendum)
 Thank you for visiting [facility] today.    Please find our contact information below.      In case of:    a suspected relapse (new symptoms or worsening existing symptoms, lasting for >24h)  OR  a need for a regular appointment  OR  you would have other questions:    Please contact:  [facility]  [facility]  [address], [place], [place] [address]  Fax: [phone]    For clinical and scheduling questions, please contact:  RN, phone: [phone]     If you need assistance from our social worker, please contact:   Rona Waylan HUGHS, phone: [phone]    If you need to speak with the pharmacist, please contact:  Corean Bailer, PharmD,CPP; phone: [phone]    If you would have questions outside regular office hours, please call [facility] hospital operator:   Phone: [phone], and ask for a neurology resident on-call.             Dr. Elspeth Devon  MS/Neuroimmunology Fellow  [facility], Department of Neurology  Multiple Sclerosis/Neuroimmunology Division    [facility] is a specialty clinic and there is a need for you to have a  primary care provider  who will take care of your non-neurological health.   ....................................................................................................    Our recommendations from today's visit:     - Labs today    - neuro-ophthalmoloyg referral     - repeat imaging     - follow up in 6 months    ........................................................................................SABRA    It was a pleasure to see you today!    We are grateful for the opportunity to contribute to your care and are looking forward to seeing you again.  Please if you can take time to complete a post-visit survey so we can identify opportunities for improvement. We also like to know when we do things well so we can continue providing great service.

## 2024-08-29 LAB — LAMOTRIGINE LEVEL: LAMOTRIGINE LEVEL: 7.6 ug/mL

## 2024-09-03 NOTE — Unmapped (Signed)
 FAX CONFIRMATION     Orders/documents that were faxed:  [employer] Predetermination form- signed  Facility/location it was faxed to: [facility]; AttnBETHA Gun  Phone:  Fax: [phone]  Time sent: 1:00 pm  Fax confirmation received and scanned into media

## 2024-09-11 NOTE — Unmapped (Signed)
 [facility] Refill Coordination Note    Specialty Medication(s) to be Shipped:   Neurology: ACTEMRA  ACTPEN 162 mg/0.9 mL Pnij subcutaneous injection (tocilizumab )    Other medication(s) to be shipped: No additional medications requested for fill at this time    Specialty Medications not needed at this time: N/A     Austin Banks, DOB: [birth year]  Phone: [phone] (home)       All above HIPAA information was verified with patient's family member, mother.     Was a Nurse, learning disability used for this call? No    Completed refill call assessment today to schedule patient's medication shipment from the [facility]  [phone]).  All relevant notes have been reviewed.     Specialty medication(s) and dose(s) confirmed: Regimen is correct and unchanged.   Changes to medications: Austin Banks reports no changes at this time.  Changes to insurance: No  New side effects reported not previously addressed with a pharmacist or physician: None reported  Questions for the pharmacist: No    Confirmed patient received a Conservation officer, historic buildings and a Surveyor, mining with first shipment. The patient will receive a drug information handout for each medication shipped and additional FDA Medication Guides as required.       DISEASE/MEDICATION-SPECIFIC INFORMATION        For patients on injectable medications: Next injection is scheduled for 09/18/24.    SPECIALTY MEDICATION ADHERENCE     Medication Adherence    Patient reported X missed doses in the last month: 0  Specialty Medication: tocilizumab : ACTEMRA  ACTPEN 162 mg/0.9 mL Pnij subcutaneous injection  Patient is on additional specialty medications: No  Patient is on more than two specialty medications: No  Any gaps in refill history greater than 2 weeks in the last 3 months: no  Demonstrates understanding of importance of adherence: yes  Informant: patient  Confirmed plan for next specialty medication refill: delivery by pharmacy  Refills needed for supportive medications: not needed          Refill Coordination    Has the Patients' Contact Information Changed: No  Is the Shipping Address Different: No         Were doses missed due to medication being on hold? No    ACTEMRA  ACTPEN 162 /0.9  mg/ml: 0 doses of medicine on hand       REFERRAL TO PHARMACIST     Referral to the pharmacist: Not needed      [facility]     Shipping address confirmed in Epic.     Cost and Payment: Patient has a copay of $4. They are aware and have authorized the pharmacy to charge the credit card on file.    Delivery Scheduled: Yes, Expected medication delivery date: 09/16/24.     Medication will be delivered via UPS to the prescription address in Epic WAM.    Austin Banks   [facility]  Specialty Technician

## 2024-09-15 MED FILL — ACTEMRA ACTPEN 162 MG/0.9 ML SUBCUTANEOUS PEN INJECTOR: SUBCUTANEOUS | 28 days supply | Qty: 3.6 | Fill #3

## 2024-09-16 DIAGNOSIS — G40909 Epilepsy, unspecified, not intractable, without status epilepticus: Principal | ICD-10-CM

## 2024-09-16 MED ORDER — XCOPRI 200 MG TABLET
ORAL_TABLET | 3 refills | 0.00000 days
Start: 2024-09-16 — End: ?

## 2024-09-17 MED ORDER — XCOPRI 200 MG TABLET
ORAL_TABLET | ORAL | 3 refills | 0.00000 days | Status: CP
Start: 2024-09-17 — End: ?

## 2024-10-08 DIAGNOSIS — G0481 Other encephalitis and encephalomyelitis: Principal | ICD-10-CM

## 2024-10-08 MED ORDER — ACTEMRA ACTPEN 162 MG/0.9 ML SUBCUTANEOUS PEN INJECTOR
SUBCUTANEOUS | 3 refills | 28.00000 days
Start: 2024-10-08 — End: ?

## 2024-10-08 NOTE — Progress Notes (Signed)
 [facility] Delivery Pharm[facility] Refill Coordination Note    Specialty Medication(s) to be Shipped:   Inflammatory Disorders: Actemra     Other medication(s) to be shipped: No additional medications requested for fill at this time    Specialty Medications not needed at this time: N/A     Austin Banks, DOB: 17-Jan-1991  Phone: [phone] (home)       All above HIPAA information was verified with patient's family member, Mother.     Was a nurse, learning disability used for this call? No    Completed refill call assessment today to schedule patient's medication shipment from the [facility]  [phone]).  All relevant notes have been reviewed.     Specialty medication(s) and dose(s) confirmed: Regimen is correct and unchanged.   Changes to medications: Austin Banks reports no changes at this time.  Changes to insurance: No  New side effects reported not previously addressed with a pharmacist or physician: None reported  Questions for the pharmacist: No    Confirmed patient received a Conservation Officer, Historic Buildings and a Surveyor, Mining with first shipment. The patient will receive a drug information handout for each medication shipped and additional FDA Medication Guides as required.       DISEASE/MEDICATION-SPECIFIC INFORMATION        For patients on injectable medications: Next injection is scheduled for 10/09/2024.    SPECIALTY MEDICATION ADHERENCE     Medication Adherence    Patient reported X missed doses in the last month: 0  Specialty Medication: tocilizumab : ACTEMRA  ACTPEN 162 mg/0.9 mL Pnij subcutaneous injection  Patient is on additional specialty medications: No              Were doses missed due to medication being on hold? No    tocilizumab : ACTEMRA  ACTPEN 162 mg/0.9 mL Pnij subcutaneous injection: 1 doses of medicine on hand     REFERRAL TO PHARMACIST     Referral to the pharmacist: Not needed      SHIPPING     Shipping address confirmed in Epic.     Cost and Payment: Patient has a $0 copay, payment information is not required.    Delivery Scheduled: Yes, Expected medication delivery date: 10/13/2024.  However, Rx request for refills was sent to the provider as there are none remaining.     Medication will be delivered via UPS to the prescription address in Epic WAM.    Austin Banks   [facility]  Specialty Technician

## 2024-10-09 MED ORDER — ACTEMRA ACTPEN 162 MG/0.9 ML SUBCUTANEOUS PEN INJECTOR
SUBCUTANEOUS | 3 refills | 28.00000 days | Status: CP
Start: 2024-10-09 — End: ?
  Filled 2024-10-12: qty 3.6, 28d supply, fill #0

## 2024-10-09 NOTE — Telephone Encounter (Signed)
 Last Visit Date: 09/16/2024  Next Visit Date: 03/18/2025    No results found for: CBC, CMP     Results for orders placed or performed during the hospital encounter of 02/02/24   ECG 12 Lead    Collection Time: 02/02/24  7:33 PM   Result Value Ref Range    EKG Systolic BP  mmHg    EKG Diastolic BP  mmHg    EKG Ventricular Rate 94 BPM    EKG Atrial Rate 94 BPM    EKG P-R Interval 126 ms    EKG QRS Duration 78 ms    EKG Q-T Interval 376 ms    EKG QTC Calculation 470 ms    EKG Calculated P Axis 73 degrees    EKG Calculated R Axis 66 degrees    EKG Calculated T Axis 61 degrees    QTC Fredericia 436 ms

## 2024-10-27 ENCOUNTER — Inpatient Hospital Stay: Admit: 2024-10-27 | Discharge: 2024-10-27 | Payer: Medicaid (Managed Care)

## 2024-10-27 MED ADMIN — gadopiclenol (ELUCIREM,VUEWAY) injection 6.8 mL: 6.8 mL | INTRAVENOUS | @ 18:00:00 | Stop: 2024-10-27

## 2024-11-04 MED ORDER — LAMOTRIGINE 200 MG TABLET
ORAL_TABLET | ORAL | 11 refills | 0.00000 days | Status: CP
Start: 2024-11-04 — End: ?

## 2024-11-04 NOTE — Telephone Encounter (Signed)
 Refill request received from patient.      Medication Requested: lamotrigine  200 mg  Last Office Visit: 07/10/2024   Next Office Visit: 03/18/2025  Last Prescriber: amado ngo, fnp    Nurse refill requirements met? Yes  If not met, why:     Sent to: Pharmacy per protocol  If sent to provider, which provider?:

## 2024-11-09 MED ORDER — DIVALPROEX ER 500 MG TABLET,EXTENDED RELEASE 24 HR
ORAL_TABLET | Freq: Every evening | ORAL | 11 refills | 30.00000 days | Status: CP
Start: 2024-11-09 — End: ?

## 2024-11-09 NOTE — Telephone Encounter (Signed)
 Refill request received from patient.      Medication Requested: divalproex  ER (DEPAKOTE  ER) 500 MG   Last Office Visit: 07/10/2024   Next Office Visit: 03/18/2025  Last Prescriber: Amado Rockers, FNP    Nurse refill requirements met? No  If not met, why: Medication not on active SOP    Sent to: Provider for signing  If sent to provider, which provider?: Amado Rockers, FNP

## 2024-11-11 NOTE — Progress Notes (Signed)
 [facility] has made a second and final attempt to reach this patient to refill the following medication:tocilizumab : ACTEMRA  ACTPEN 162 mg/0.9 mL Pnij subcutaneous injection.      We have left voicemails on the following phone numbers:  [phone]    , have sent a text message to the following phone numbers: [phone], and have sent a Mychart questionnaire..    Dates contacted: 11/04/24 and  11/11/24   Last scheduled delivery: 10/12/24     The patient may be at risk of non-compliance with this medication. The patient should call the [facility] at [phone]  Option 4, then Option 3: Allergy, Immunology, Pulmonary, Neurology to refill medication.    Tawni Daring   [facility]

## 2024-11-13 NOTE — Progress Notes (Signed)
 [facility] Refill Coordination Note    Specialty Medication(s) to be Shipped:   Neurology: ACTEMRA  ACTPEN 162 mg/0.9 mL Pnij subcutaneous injection (tocilizumab )    Other medication(s) to be shipped: No additional medications requested for fill at this time    Specialty Medications not needed at this time: N/A     Austin Banks, DOB: 08-25-1991  Phone: [phone] (home)       All above HIPAA information was verified with patient's family member, Austin Banks .     Was a nurse, learning disability used for this call? No    Completed refill call assessment today to schedule patient's medication shipment from the [facility]  [phone]).  All relevant notes have been reviewed.     Specialty medication(s) and dose(s) confirmed: Regimen is correct and unchanged.   Changes to medications: Austin Banks reports no changes at this time.  Changes to insurance: No  New side effects reported not previously addressed with a pharmacist or physician: None reported  Questions for the pharmacist: No    Confirmed patient received a Conservation Officer, Historic Buildings and a Surveyor, Mining with first shipment. The patient will receive a drug information handout for each medication shipped and additional FDA Medication Guides as required.       DISEASE/MEDICATION-SPECIFIC INFORMATION        N/A    SPECIALTY MEDICATION ADHERENCE     Medication Adherence    Patient reported X missed doses in the last month: 0  Specialty Medication: ACTEMRA  ACTPEN 162 mg/0.9 mL Pnij subcutaneous injection (tocilizumab )  Patient is on additional specialty medications: No  Patient is on more than two specialty medications: No  Any gaps in refill history greater than 2 weeks in the last 3 months: no  Demonstrates understanding of importance of adherence: yes              Were doses missed due to medication being on hold? No        ACTEMRA  ACTPEN 162 mg/0.9 mL Pnij subcutaneous injection (tocilizumab ): 0 doses of medicine on hand Specialty medication is an injection or given on a cycle: Yes, Next injection is scheduled for 11/13/24 .    REFERRAL TO PHARMACIST     Referral to the pharmacist: Not needed      [facility]     Shipping address confirmed in Epic.     Cost and Payment: Unable to determine copay at this time as the prescription requires a prior authorization/financial assistance. Patient is aware that shipment will be held until copay has been approved and payment information collected, if needed.    Delivery Scheduled: Yes, Expected medication delivery date: 11/18/24 .     Medication will be delivered via UPS to the prescription address in Epic WAM.    Tawni Daring   [facility]  Specialty Technician

## 2024-11-14 DIAGNOSIS — G0481 Other encephalitis and encephalomyelitis: Principal | ICD-10-CM

## 2024-11-17 MED FILL — ACTEMRA ACTPEN 162 MG/0.9 ML SUBCUTANEOUS PEN INJECTOR: SUBCUTANEOUS | 28 days supply | Qty: 3.6 | Fill #0

## 2024-12-03 NOTE — Telephone Encounter (Signed)
 Called patient's father to remind them that patient's safety labs are due. He states that he will take patient to get his labs done soon.

## 2024-12-08 DIAGNOSIS — G40209 Localization-related (focal) (partial) symptomatic epilepsy and epileptic syndromes with complex partial seizures, not intractable, without status epilepticus: Principal | ICD-10-CM

## 2024-12-08 LAB — LACTATE SEPSIS, VENOUS: LACTATE BLOOD VENOUS: 3.7 mmol/L (ref 0.5–1.8)

## 2024-12-08 LAB — BLOOD GAS, VENOUS
BASE EXCESS VENOUS: -1 (ref -2.0–2.0)
BASE EXCESS VENOUS: -2 (ref -2.0–2.0)
CARBOXYHEMOGLOBIN, VENOUS: <1 % (ref <1.2)
CARBOXYHEMOGLOBIN, VENOUS: <1 % (ref <1.2)
HCO3 VENOUS: 24 mmol/L (ref 22–27)
HCO3 VENOUS: 25 mmol/L (ref 22–27)
METHEMOGLOBIN, VENOUS: <1 % (ref <1.5)
METHEMOGLOBIN, VENOUS: <1 % (ref <1.5)
O2 SATURATION VENOUS: 50.1 % (ref 40.0–85.0)
O2 SATURATION VENOUS: 51.8 % (ref 40.0–85.0)
OXYHEMOGLOBIN, VENOUS: 49.4 % (ref 40.0–85.0)
OXYHEMOGLOBIN, VENOUS: 51.1 % (ref 40.0–85.0)
PCO2 VENOUS: 52 mmHg (ref 40–60)
PCO2 VENOUS: 52 mmHg (ref 40–60)
PH VENOUS: 7.28 — ABNORMAL LOW (ref 7.32–7.43)
PH VENOUS: 7.3 — ABNORMAL LOW (ref 7.32–7.43)
PO2 VENOUS: 34 mmHg (ref 30–55)
PO2 VENOUS: 35 mmHg (ref 30–55)

## 2024-12-08 LAB — CBC W/ AUTO DIFF
BASOPHILS ABSOLUTE COUNT: 0 10*9/L (ref 0.0–0.1)
BASOPHILS RELATIVE PERCENT: 0.2 %
EOSINOPHILS ABSOLUTE COUNT: 0 10*9/L (ref 0.0–0.5)
EOSINOPHILS RELATIVE PERCENT: 0 %
HEMATOCRIT: 50.3 % — ABNORMAL HIGH (ref 39.0–48.0)
HEMOGLOBIN: 17.2 g/dL — ABNORMAL HIGH (ref 12.9–16.5)
LYMPHOCYTES ABSOLUTE COUNT: 2.2 10*9/L (ref 1.1–3.6)
LYMPHOCYTES RELATIVE PERCENT: 17.1 %
MEAN CORPUSCULAR HEMOGLOBIN CONC: 34.2 g/dL (ref 32.0–36.0)
MEAN CORPUSCULAR HEMOGLOBIN: 32.1 pg (ref 25.9–32.4)
MEAN CORPUSCULAR VOLUME: 93.9 fL (ref 77.6–95.7)
MEAN PLATELET VOLUME: 8.9 fL (ref 6.8–10.7)
MONOCYTES ABSOLUTE COUNT: 1.7 10*9/L — ABNORMAL HIGH (ref 0.3–0.8)
MONOCYTES RELATIVE PERCENT: 13 %
NEUTROPHILS ABSOLUTE COUNT: 9.1 10*9/L — ABNORMAL HIGH (ref 1.8–7.8)
NEUTROPHILS RELATIVE PERCENT: 69.7 %
PLATELET COUNT: 74 10*9/L — ABNORMAL LOW (ref 150–450)
RED BLOOD CELL COUNT: 5.35 10*12/L (ref 4.26–5.60)
RED CELL DISTRIBUTION WIDTH: 13 % (ref 12.2–15.2)
WBC ADJUSTED: 13 10*9/L — ABNORMAL HIGH (ref 3.6–11.2)

## 2024-12-08 LAB — COMPREHENSIVE METABOLIC PANEL
ALBUMIN: 3.8 g/dL (ref 3.4–5.0)
ALKALINE PHOSPHATASE: 57 U/L (ref 46–116)
ALT (SGPT): 38 U/L (ref 10–49)
ANION GAP: 16 mmol/L — ABNORMAL HIGH (ref 5–14)
AST (SGOT): 50 U/L — ABNORMAL HIGH (ref <=34)
BILIRUBIN TOTAL: 0.3 mg/dL (ref 0.3–1.2)
BLOOD UREA NITROGEN: 13 mg/dL (ref 9–23)
BUN / CREAT RATIO: 6
CALCIUM: 8.8 mg/dL (ref 8.7–10.4)
CHLORIDE: 100 mmol/L (ref 98–107)
CO2: 17 mmol/L — ABNORMAL LOW (ref 20.0–31.0)
CREATININE: 2.13 mg/dL — ABNORMAL HIGH (ref 0.73–1.18)
EGFR CKD-EPI (2021) MALE: 41 mL/min/1.73m2 — ABNORMAL LOW (ref >=60)
GLUCOSE RANDOM: 87 mg/dL (ref 70–179)
POTASSIUM: 4.6 mmol/L (ref 3.4–4.8)
PROTEIN TOTAL: 7.4 g/dL (ref 5.7–8.2)
SODIUM: 133 mmol/L — ABNORMAL LOW (ref 135–145)

## 2024-12-08 LAB — SLIDE REVIEW

## 2024-12-08 LAB — TSH: THYROID STIMULATING HORMONE: 1.761 u[IU]/mL (ref 0.550–4.780)

## 2024-12-08 LAB — VALPROIC ACID LEVEL, TOTAL: VALPROIC ACID TOTAL: 101.2 ug/mL — ABNORMAL HIGH (ref 50.0–100.0)

## 2024-12-08 LAB — ETHANOL: ETHANOL: <10 mg/dL (ref <=10)

## 2024-12-08 LAB — PRO-BNP: PRO-BNP: 49 pg/mL (ref <=300.0)

## 2024-12-08 LAB — MAGNESIUM: MAGNESIUM: 1.5 mg/dL — ABNORMAL LOW (ref 1.6–2.6)

## 2024-12-08 MED ORDER — CLONAZEPAM 1 MG TABLET
ORAL_TABLET | ORAL | 5 refills | 0.00000 days | Status: CP
Start: 2024-12-08 — End: ?

## 2024-12-08 NOTE — Telephone Encounter (Signed)
 Patient's father called. Returned call. Patient's father states that he could not get patient out of bed today, and he can barely walk. He is very weak. Patient's father is hesitant to take him to the ED. Advised him that if patient worsens at all, he should take the patient to the ED. Routed to provider.

## 2024-12-08 NOTE — ED Triage Note (Signed)
 Pt BIB father for possible seizures. Reports pt has hx of seizures. Pt non-verbal at NF however nods head in response to questions.

## 2024-12-08 NOTE — Telephone Encounter (Signed)
 I spoke to patient's father  He reports patient has been vomiting and not eating or drinking.  He is very weak and unable to walk.  He was shaking this morning which is unclear if he was having seizures.  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    They are agreeable to bring patient to get emergency care.

## 2024-12-08 NOTE — Consults (Signed)
 Initial Consult Note      Requesting Attending Physician:  Austin Mardeen Moats, MD  Service Requesting Consult: Emergency Medicine     Assessment and Plan       Austin Banks is a 34 y.o. male on whom I have been asked by Austin Mardeen Moats, MD to consult for AMS given seizure history.    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  Currently managed at home on Depakote , Xcopri , Lamictal , Klonopin  and has Nayzilam  for rescue.  Presenting for 2 days of cough, fever, emesis, poor p.o. intake, malaise, and generalized weakness concerning for infection.  Patient also endorses ongoing substance use (methamphetamine).  No reported breakthrough seizures, though patient does report frequent missed medication doses of his antiseizure medications.  Neurologic exam reassuring, though ill-appearing, his cognition is intact and he has no focal deficits.  Low concern for nonconvulsive seizure at this time.  No new headache, focal deficit, or meningismus concerning for CNS infection.  He does have increased risk for breakthrough seizure with lowered seizure threshold in setting of likely infection, substance use, and missed medications.     Recommendations:  -Check ASM levels (lamictal , valproate acid)  -Continue home antiepileptic regimen as able  If unable to tolerate PO can transition depakote  to IV with assistance of pharmacy however other medications (Xcopri , lamictal ) do not have IV formulation thus in that case would consider NGT for enteral access  -No current indication for advanced imaging  -No current indication for EEG  -Continued workup for infection per primary team    Dr. Hayes was available. Findings, interpretations, and recommendations discussed with patient, family, and primary team.    Austin JAYSON Joy, MD  PGY-4 Neurology     HPI      Reason for Consult: History of epilepsy    Austin Banks is a 34 y.o.  male on whom I have been asked by Austin Mardeen Moats, MD to consult for history of epilepsy.    History of epilepsy since age 32 around the time of a TBI though differential of autoimmune epilepsy given CSF/serum gad 65.  He has well-controlled at home on Depakote , Xcopri , Lamictal , Klonopin .    Presents today for 2 days of increased cough, nausea, vomiting, malaise with generalized weakness.  Patient states he tried ice (methamphetamine via inhalation) a few days ago and blames his current symptoms on this.  Father at bedside reports no witnessed seizures and patient thinks he may have had a seizure in the past week though he does not remember and per chart review he is amnestic to his events. He says he is generally good about taking his medications but will miss a dose here and there, maybe 1-2 times weekly but denies back-to-back missed doses leading up to current admission.    Per family at bedside his current mental state is not in line with his typical ictal or postictal states.    Known semiologies:  1) blinking, left gaze, left facial twitching, LUE flexion, LLE tonic-clonic to generalized. Post-ictal confusion up to 20 minutes  2) left gaze, behavioral arrest with post-ictal confusional state often with attempt to remove clothes lasting about 10 minutes  -No prior EEG correlate to event of cold sensation, feeling like a seizure is coming on, delayed responsiveness with intact responses and commands    Per review of outpatient and prior inpatient notes, no known history of electrographic only seizures  Allergies[1]  Current Medications[2]  Past Medical History[3]  Past Surgical History[4]  Social History[5]  Family History[6]  Code Status: Full Code     Review of Systems   A 10-system review of systems was conducted and was negative except as documented above in the HPI.      Objective    Temp:  [38.1 ??C (100.6 ??F)] 38.1 ??C (100.6 ??F)  Pulse:  [82-100] 82  SpO2 Pulse:  [83] 83  Resp:  [16-30] 27  BP: (86-91)/(51-66) 91/66  SpO2:  [91 %-96 %] 96 %    General Appearance:Ill-appearing, diaphoretic.   HEENT: Head is atraumatic and normocephalic. Sclera anicteric without injection. Oropharyngeal membranes are moist with no erythema or exudate.  Cardiopulmonary: Regular rate and rhythm. Hypotensive. On 6L [place] without accessory muscle use  Extremities: No clubbing, cyanosis, or edema.    Neurological Examination:   Mental Status: Eyes closed but verbally responsive, slightly slowed responses improved with ongoing stimuli. Oriented to self, year, month, and close to location [facility] ED instead of [place]). Answers simple and complex questions and follows all commands appropriately. Language fluent with mild dysarthria. Memory for recent events intact.    Cranial Nerves: Fields intact. PERRL. Restricted left gaze though crosses midline and can nearly bury with right-beating nystagmus on left gaze (previously documented), other EOM intact. Face symmetric. Hearing intact. Tongue midline.    Motor Exam: Normal bulk.  No tremors, myoclonus, or other adventitious movement.  UE R/L: lateral deltoid (shoulder abduction) 5/5, biceps 5/5, triceps 5/5, and hand grip mildly weak/mildly weak   LE R/L: hip flexion 5/5 and plantar flexion 5/5.    Reflexes: Deferred.    Sensory: Sensation normal to light touch in both hands and both feet    Cerebellar/Coordination/Gait: Finger-to-nose is normal without ataxia or dysmetria bilaterally. Deferred gait given oxygen need and ill appearance.      Diagnostic Studies    EMU 01/2022: electroclinical seizure with rapid blinking -> left gaze -> left tonic -> GTC  cvEEG 03/29/22: rare bitemporal sharps, no seizure  rEEG 02/02/24: normal  MRI 08/2024 stable cerebellar atrophy    Imaging and relevant diagnostic studies reviewed and interpreted, pertinent findings as in above assessment and plan.           [1]   Allergies  Allergen Reactions    Lacosamide  Other (See Comments)     Psychosis    Hallucinations   [2]   Current Facility-Administered Medications:     magnesium  sulfate in water 2 gram/50 mL (4 %) IVPB 2 g, 2 g, Intravenous, Once, Nicholaus Austin Evener, MD, Last Rate: 25 mL/hr at 12/08/24 2200, 2 g at 12/08/24 2200    vancomycin (VANCOCIN) 1500 mg in sodium chloride  (NS) 0.9% 500 mL IVPB, 1,500 mg, Intravenous, Once, Edwina Hash, MD    Current Outpatient Medications:     cetirizine  (ZYRTEC ) 10 MG tablet, Take 1 tablet (10 mg total) by mouth every morning., Disp: , Rfl:     clonazePAM  (KLONOPIN ) 1 MG tablet, TAKE 1 TABLET BY MOUTH IN THE MORNING and TAKE 2 TABLETS AT BEDTIME (PER patient) may take additional tablets after a big seizure or 2 small one. no more than 4 tablets per day, Disp: 120 tablet, Rfl: 5    divalproex  ER (DEPAKOTE  ER) 500 MG extended released 24 hr tablet, TAKE 3 TABLETS BY MOUTH NIGHTLY, Disp: 90 tablet, Rfl: 11    empty container Misc, Use as directed to dispose of injections, Disp: 1 each, Rfl: 3  folic acid  (FOLVITE ) 1 MG tablet, Take 1 tablet (1 mg total) by mouth daily., Disp: 30 tablet, Rfl: 5    haloperidol  (HALDOL ) 5 MG tablet, Take 3 tablets (15 mg total) by mouth nightly., Disp: , Rfl:     lamoTRIgine  (LAMICTAL ) 200 MG tablet, TAKE 1 TABLET BY MOUTH IN THE MORNING and TAKE 1 TABLET AT BEDTIME, Disp: 60 tablet, Rfl: 11    levocarnitine  500 mg Tab, Take 1 tablet (500 mg) by mouth Two (2) times a day., Disp: 60 tablet, Rfl: 11    midazolam  (NAYZILAM ) 5 mg/spray (0.1 mL) Spry, 1 spray (5 mg) into 1 nostril for a seizure 3 minutes long; May give 2nd dose after 10 minutes for recurrent seizures. do not give a second dose if the patient has trouble breathing, MAX- 2 sprays in 72hrs and 4 sprays a week., Disp: 4 each, Rfl: 5    multivit-min-iron  fum-folic ac 7.5 mg iron -400 mcg Tab, Take 1 tablet by mouth daily., Disp: , Rfl:     naloxone  (NARCAN ) 4 mg nasal spray, Give one nasal spray for mental status change due to excessive illicit drug use and call 911, Disp: 2 each, Rfl: 0    tocilizumab  (ACTEMRA  ACTPEN) 162 mg/0.9 mL PnIj subcutaneous injection, Inject the contents of 1 pen (162 mg) under the skin every seven (7) days., Disp: 3.6 mL, Rfl: 3    XCOPRI  200 mg tablet, TAKE 2 TABLETS BY MOUTH AT BEDTIME per patient, Disp: 60 tablet, Rfl: 3  [3]   Past Medical History:  Diagnosis Date    Fall     from medications    Hepatitis B surface antigen positive     Schizophrenia (CMS-HCC)     Seizure    (CMS-HCC)     TBI (traumatic brain injury) (CMS-HCC)     Vitamin D deficiency    [4]   Past Surgical History:  Procedure Laterality Date    MANDIBLE FRACTURE SURGERY     [5]   Social History  Socioeconomic History    Marital status: Single     Spouse name: None    Number of children: None    Years of education: None    Highest education level: None   Tobacco Use    Smoking status: Every Day     Current packs/day: 1.00     Average packs/day: 1 pack/day for 10.0 years (10.0 ttl pk-yrs)     Types: Cigarettes    Smokeless tobacco: Never    Tobacco comments:     10-20cpd   Vaping Use    Vaping status: Never Used   Substance and Sexual Activity    Alcohol use: Not Currently     Comment: rare    Drug use: Yes     Frequency: 7.0 times per week     Types: Marijuana, Cocaine   Social History Narrative    Updated May 2023        PSYCHIATRIC HX:     -Prior Dx: Schizophrenia. Seizures started ~age 89 around same time of head injury (unknown mechanism), psychotic symptoms ~age 33 by chart review. Seizures intractible for many years, in 2021 was found to be GAD65+ in serum and CSF, has been receiving IVIG + cellcept  or rituximab since then. Historically in post-ictal state has had more psychotic symptoms/agitation.     -Hospitalizations: 10+ prior including at [facility]; most recent at [facility] in 2021, involuntarily on 3NSH due to psychosis. Last known in Nov 2022 at [facility] for delusions/AH    -  Suicide attempts: denies     -Non-suicidal self-injury: denies      -Medication Trials: Zyprexa (stopped due to fear of increased seizure risk, was on doses as high as 40mg ), Risperdal  (and Zyprexa and risperidone  together at times), Geodon, abilify, Lamictal , Invega Sustenna x 2 months and stopped at [facility]. In spring 2023 admitted to Neuro on risperidone  3mg  BID, while hospitalized changed to haldol  5mg  BID (May 2023)    -Med compliance Hx: fair     -Current treatment: ACT team through [facility]    -Past Psych: [facility], [facility] in [place]. [facility]- many group homes and different ACT/CST in past        SUBSTANCE ABUSE HX:    # Marijuana: several times a day since age 70, reported use as recently as 2022    # Cocaine: prior use, occasionally. Last reported use ~2012    # Alcohol: Patient denies recent use. No known hx of Sz/DT/Complicated WD Hx, in 2016 drinking 2-3 drinks a day         Social Hx:    -Current Living Environment: Currently living with parents; previously at group homes; [name] and [facility] in [place]    -Guardian: Father; Braelyn Bordonaro (home [phone]), cell [phone])    -Sexual orientation: openly homosexual to family from 2016, also has a child     -Education/School Performance: High school, regular classes     -Income/Employment: disability check; -dad is his payee    -Legal/School Discipline: breaking and entering, assault charges     -Abuse/Neglect/Trauma/DV: denies     -Violence (perp): previous assault charges     -Weapons Access: denies guns        FAMILY HX    -Mental Illness: denies     -Suicide: denies     -Substance Abuse: extensive history alcohol and recreational drug use     Social Drivers of Health     Food Insecurity: Food Insecurity Present (10/09/2023)    Received from [facility]    Hunger Vital Sign     Within the past 12 months, you worried that your food would run out before you got the money to buy more.: Often true     Within the past 12 months, the food you bought just didn't last and you didn't have money to get more.: Sometimes true Tobacco Use: High Risk (12/08/2024)    Patient History     Smoking Tobacco Use: Every Day     Smokeless Tobacco Use: Never   Transportation Needs: No Transportation Needs (10/09/2023)    Received from [facility] - [facility]     In the past 12 months, has lack of transportation kept you from medical appointments or from getting medications?: No     Lack of Transportation (Non-Medical): No   Alcohol Use: Not At Risk (07/01/2024)    Received from [facility]    AUDIT-C     Q1: How often do you have a drink containing alcohol?: Never     Q2: How many drinks containing alcohol do you have on a typical day when you are drinking?: Patient does not drink   Housing: Unknown (10/09/2023)    Received from [facility]    Housing Stability Vital Sign     In the last 12 months, was there a time when you were not able to pay the mortgage or rent on time?: No  At any time in the past 12 months, were you homeless or living in a shelter (including now)?: No   Utilities: Not At Risk (10/09/2023)    Received from [facility] Utilities     Threatened with loss of utilities: No   Interpersonal Safety: Not At Risk (12/08/2024)    Interpersonal Safety     Unsafe Where You Currently Live: No     Physically Hurt by Anyone: No     Abused by Anyone: No   Financial Resource Strain: Low Risk  (10/09/2023)    Received from [facility]    Overall Financial Resource Strain (CARDIA)     Difficulty of Paying Living Expenses: Not very hard   [6]   Family History  Problem Relation Age of Onset    Hypertension Mother     Hypertension Father     Diabetes Father     Cancer Maternal Aunt     Cancer Maternal Grandmother     Cancer Paternal Grandfather     Seizures Neg Hx

## 2024-12-08 NOTE — Telephone Encounter (Signed)
 Per Dr. Lesta, patient can decrease lab interval to every 6 months. Called patient's father. He understands that patient will now only need to get labs done every 6 months.     Patient's father states that patient is having trouble walking and feels weak. He is having falls and having seizures that we do not see. He is sleeping all the time. Patient's father denies that he has had a recent infection but states that it is hard to tell because he coughs and sneezes a lot at baseline.     Called patient. Unable to reach patient since he does not have his own phone. Patient's father states that patient is at his apartment.     [facility] Neurology Triage Call Note:     Urgency: Semi-Urgent (within 48 hours)    Confirm preferred call back number: [phone]     Situation:     Trilby [place]a father discussed patient's symptoms with RN during call regarding lab orders.  Symptoms include difficulty walking, sleeping all the time, having seizures that we cannot see, and having falls.      Diagnosis: autoimmune encephalitis      Background:     Last Visit Date: 11/09/2024    Next Visit Date: 03/18/2025

## 2024-12-08 NOTE — ED Provider Notes (Signed)
 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 He is accompanied by his father.    He has had marked weakness over the past two days. He has not been taking his seizure medications as prescribed. He has had no known witnessed seizures in the last two days, though he lives alone and he may have seizures in his sleep. Today he has been vomiting and has not eaten. He has had fever since yesterday, including on arrival to the ED. He reports a cough for more than a few days, exact duration unclear.    He fell last week and hit his head, with no head injuries reported this week.  Patient endorses cigarette, cocaine, marijuana use, however denies using any of these in the last several weeks.       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    Presentation concerning for a metabolic encephalopathy in this patient with prior intracranial pathology; suspect the etiology of his symptoms associated with respiratory viral infection.  DDx pneumonia, viral URI, electrolyte abnormality, dehydration, UTI, substance use, intracranial hemorrhage, among others.  Patient meeting sepsis criteria on presentation, will empirically antibiose.  Low concern for stroke given nonfocal neurologic exam.  Low concern for seizure given though his presentation appears encephalopathic, he is able to answer complex questions and follow commands without issue.  Will consult neurology, and patient will require admission following initial workup and management.      Plan:   Orders Placed This Encounter   Procedures    Commode chair    Respiratory Pathogen Panel with COVID (Nasopharyngeal)    Blood Culture #1    Blood Culture #2    MRSA Screen    CT Head Wo Contrast    XR Chest Portable    XR Chest Portable    US  Abdomen Limited    CBC w/ Differential    Comprehensive Metabolic Panel    Magnesium  Level    Urinalysis with Microscopy with Culture Reflex    Drug Screen, Urine    Lamotrigine  level    Ethanol    Lactate Sepsis, Venous    TSH    Blood Gas, Venous    Pro-BNP    Blood Gas, Venous    Valproic Acid Level    Lactate Sepsis, Venous    CBC w/ Differential    Basic Metabolic Panel    hsTroponin I (single, no delta)    Basic Metabolic Panel    Magnesium  Level    CBC    Valproic Acid Level    Toxicology Screen, Urine  Ammonia    TSH    Cortisol    Vitamin B12 Level    Folate Level    Pathologist Smear Review    HIV Antigen/Antibody Combo    Syphilis Screen    Phosphorus Level    aPTT    PT-INR    Fibrinogen    Lactate, Venous, Whole Blood    Nutrition Therapy Regular/House    Patient may shower    Vital signs    Notify Provider    Weigh patient    Incentive Spirometry    Flush line per protocol    Target arousal level - RASS: RASS 0 to -1    Full Code    Inpatient consult to Infectious Diseases    Droplet Isolation Status    Contact Isolation Status    Occupational Therapy Eval and Treat    Physical Therapy Eval and Treat    ECG 12 Lead    ECG 12 Lead    Echocardiogram W Colorflow Spectral Doppler    EEG video monitoring    Insert 2nd peripheral IV    Insert peripheral IV    Saline lock IV    Place Patient in Bed    ED Admit Decision Seizure precautions        ED Course as of 12/10/24 0822   Tue Dec 08, 2024   2133 WBC(!): 13.0   2215 Magnesium (!): 1.5  Will replete at this time   2215 Neuro paged  4M PMHx 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 Reported poor med adherence. Nonfocal neuro exam, slow response but A&Ox4, follows commands appropriately.   2223 Discussed patient with neurology, who will see the patient at this time   2229 Lactate, Venous(!!): 3.7  IVF and antibiotics running   2244 pH, Venous(!): 7.28  In the setting of elevated lactate   2258 Signed out patient to oncoming provider       ____________________________________________    The case was discussed with the attending physician who is in agreement with the above assessment and plan    Additional Medical Decision Making     I have reviewed the vital signs and the nursing notes. Labs and radiology results that were available during my care of the patient were independently reviewed by me and considered in my medical decision making.     I independently visualized the EKG tracing if performed  I independently visualized the radiology images if performed  I reviewed the patient's prior medical records if available.  Additional history obtained from family if available  I discussed the case with the admitting provider and the consulting services if the patient was admitted and/or consulting services were utilized.     History     Past Medical History[1]    Past Surgical History[2]      Current Facility-Administered Medications:     acetaminophen  (TYLENOL ) tablet 650 mg, 650 mg, Oral, Q6H PRN, Cookmeyer, Alm CROME, MD    aluminum-magnesium  hydroxide-simethicone (MAALOX MAX) 80-80-8 mg/mL oral suspension, 30 mL, Oral, Q4H PRN, Cookmeyer, Alm CROME, MD    cenobamate  (XCOPRI ) tablet 200 mg, 200 mg, Oral, Daily, Cookmeyer, Alm CROME, MD, 200 mg at 12/09/24 0830    cetirizine  (ZYRTEC ) tablet 10 mg, 10 mg, Oral, Q AM, Cookmeyer, Alm CROME, MD, 10 mg at 12/09/24 0830    clonazePAM  (KlonoPIN ) tablet 1 mg, 1 mg, Oral, Daily, Cookmeyer, Alm CROME, MD, 1 mg at  12/09/24 0830    clonazePAM  (KlonoPIN ) tablet 2 mg, 2 mg, Oral, Nightly, Cookmeyer, Alm CROME, MD, 2 mg at 12/09/24 2159    divalproex  ER (DEPAKOTE  ER) extended released 24 hr tablet 1,500 mg, 1,500 mg, Oral, Nightly, Cookmeyer, Alm CROME, MD, 1,500 mg at 12/09/24 2159    enoxaparin  (LOVENOX ) syringe 40 mg, 40 mg, Subcutaneous, Q24H, Cookmeyer, Alm CROME, MD, 40 mg at 12/09/24 0830    famotidine (PF) (PEPCID) injection 20 mg, 20 mg, Intravenous, BID, Johnetta Savant, MD, 20 mg at 12/09/24 2159    folic acid  (FOLVITE ) tablet 1 mg, 1 mg, Oral, Daily, Cookmeyer, Alm CROME, MD, 1 mg at 12/09/24 0830    guaiFENesin (ROBITUSSIN) oral syrup, 200 mg, Oral, Q4H PRN, Cookmeyer, Alm CROME, MD    haloperidol  (HALDOL ) tablet 15 mg, 15 mg, Oral, Nightly, Cookmeyer, Alm CROME, MD, 15 mg at 12/09/24 2159    lamoTRIgine  (LaMICtal ) tablet 200 mg, 200 mg, Oral, BID, Cookmeyer, Alm CROME, MD, 200 mg at 12/09/24 2159    levocarnitine  (CARNITOR ) (SF) oral solution, 500 mg, Oral, BID, Cookmeyer, Alm CROME, MD, 500 mg at 12/09/24 2159    melatonin tablet 3 mg, 3 mg, Oral, Nightly PRN, Cookmeyer, Alm CROME, MD    multivitamins, therapeutic with minerals tablet 1 tablet, 1 tablet, Oral, Daily, Cookmeyer, Alm CROME, MD, 1 tablet at 12/09/24 0830    NORepinephrine 8 mg in dextrose  5 % 250 mL (32 mcg/mL) infusion PMB, 0-30 mcg/min, Intravenous, Continuous, Cookmeyer, Alm CROME, MD, Last Rate: 15 mL/hr at 12/10/24 0747, 8 mcg/min at 12/10/24 0747    piperacillin-tazobactam (ZOSYN) IVPB 4.5 g in D5W 100 mL IVPB (premade), 4.5 g, Intravenous, Q6H, Johnetta Savant, MD, Stopped at 12/10/24 9358    senna (SENOKOT) tablet 2 tablet, 2 tablet, Oral, Nightly PRN, Cookmeyer, Alm CROME, MD    Allergies  Lacosamide     Family History[3]    Social History  Short Social History[4]     Physical Exam     VITAL SIGNS:      Vitals: 12/10/24 0100 12/10/24 0200 12/10/24 0300 12/10/24 0400   BP: 98/58 (!) 81/43 101/57 103/54   Pulse: 61 66 (!) 48 (!) 45   Resp:       Temp:       TempSrc:       SpO2: 94% 95% 95% 96%   Weight:           Constitutional: Laying in bed with eyes closed; arousable and oriented on questioning. No acute distress.  HEENT: Normocephalic and atraumatic. PERRL, conjunctivae are normal. No congestion. Moist mucous membranes. No meningismus.  Cardiovascular: Rate as above, regular rhythm. Normal and symmetric distal pulses. Brisk capillary refill. No notable peripheral edema.  Respiratory: Normal respiratory effort.  Wet cough.  Breath sounds with diffuse rhonchi and scattered crackles; no wheezing or stridor.    Gastrointestinal: Soft, non-distended, non-tender.  Genitourinary: Deferred.  Musculoskeletal: Non-tender with normal range of motion in all extremities.  Neurologic: Lethargic but arousable; A&Ox4, follows commands.  Very slow speech but normal language. CN 2-12 intact with over, bowl right gaze preference; PERRL, no visual field cuts, EOM intact, face symmetric, face sensation intact.  Global symmetric weakness with 4/5 strength in BL upper & lower extremities. Sensation intact in BL upper & lower extremities.  Unable to sit up on command.    Skin: Skin is warm, dry and intact. No rash noted.  Psychiatric: Mood and affect are normal.     Radiology     US   Abdomen Limited   Final Result   1.  Small amount of gallbladder sludge. No evidence of acute cholecystitis.   2.  No biliary ductal dilatation.         XR Chest Portable   Final Result      Stable chest            Echocardiogram W Colorflow Spectral Doppler   Final Result      CT Head Wo Contrast   Final Result      No acute intracranial process seen.      Overall appears relatively unchanged from prior examination.            XR Chest Portable   Final Result      Mild left retrocardiac opacities. Atelectasis versus aspiration. Pneumonia cannot be excluded. Laboratory Data     Lab Results   Component Value Date    WBC 12.3 (H) 12/09/2024    HGB 16.1 12/09/2024    HCT 47.4 12/09/2024    PLT 71 (L) 12/09/2024       Lab Results   Component Value Date    NA 140 12/09/2024    K 4.0 12/09/2024    CL 104 12/09/2024    CO2 23.0 12/09/2024    BUN 14 12/09/2024    CREATININE 1.32 (H) 12/09/2024    GLU 146 12/09/2024    CALCIUM  8.5 (L) 12/09/2024    MG 2.2 12/09/2024    PHOS 2.8 12/09/2024       Lab Results   Component Value Date    BILITOT 0.3 12/08/2024    BILIDIR 0.20 08/27/2024    PROT 7.4 12/08/2024    ALBUMIN  3.8 12/08/2024    ALT 38 12/08/2024    AST 50 (H) 12/08/2024    ALKPHOS 57 12/08/2024       Lab Results   Component Value Date    INR 1.07 12/09/2024    APTT 47.7 (H) 12/09/2024       Pertinent labs & imaging results that were available during my care of the patient were reviewed by me and considered in my medical decision making (see chart for details).    Portions of this record have been created using Scientist, clinical (histocompatibility and immunogenetics). Dictation errors have been sought, but may not have been identified and corrected.    Sheppard Florida, MD  PGY2, Emergency Medicine         [1]   Past Medical History:  Diagnosis Date    Fall     from medications    Hepatitis B surface antigen positive     Schizophrenia (CMS-HCC)     Seizure    (CMS-HCC)     TBI (traumatic brain injury) (CMS-HCC)     Vitamin D deficiency    [2]   Past Surgical History:  Procedure Laterality Date    MANDIBLE FRACTURE SURGERY     [3]   Family History  Problem Relation Age of Onset    Hypertension Mother     Hypertension Father     Diabetes Father     Cancer Maternal Aunt     Cancer Maternal Grandmother     Cancer Paternal Grandfather     Seizures Neg Hx    [4]   Social History  Tobacco Use    Smoking status: Every Day     Current packs/day: 1.00     Average packs/day: 1 pack/day for 10.0 years (10.0 ttl pk-yrs)     Types: Cigarettes  Smokeless tobacco: Never    Tobacco comments:     10-20cpd   Vaping Use    Vaping status: Never Used   Substance Use Topics    Alcohol use: Not Currently     Comment: rare    Drug use: Yes     Frequency: 7.0 times per week     Types: Marijuana, Cocaine        Edwina Hash, MD  Resident  12/10/24 [phone]

## 2024-12-08 NOTE — ED Triage Note (Signed)
 Here with father for multiple seizures, has them while sleeping and was shaking a lot today. 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 Pt reports multiple days of N/V, unsure if pt is able to keep down medications. Pt able to answer questions in triage but does so slowly.

## 2024-12-09 ENCOUNTER — Ambulatory Visit: Admit: 2024-12-09 | Payer: Medicaid (Managed Care)

## 2024-12-09 ENCOUNTER — Encounter: Admit: 2024-12-09 | Payer: Medicaid (Managed Care) | Attending: Pulmonary Disease

## 2024-12-09 ENCOUNTER — Inpatient Hospital Stay
Admission: EM | Admit: 2024-12-09 | Discharge: 2024-12-21 | Disposition: A | Discharge status: Home Health | Payer: Medicaid (Managed Care)

## 2024-12-09 LAB — BASIC METABOLIC PANEL
ANION GAP: 13 mmol/L (ref 5–14)
ANION GAP: 13 mmol/L (ref 5–14)
BLOOD UREA NITROGEN: 15 mg/dL (ref 9–23)
BLOOD UREA NITROGEN: 14 mg/dL (ref 9–23)
BUN / CREAT RATIO: 10
BUN / CREAT RATIO: 11
CALCIUM: 7.8 mg/dL — ABNORMAL LOW (ref 8.7–10.4)
CALCIUM: 8.5 mg/dL — ABNORMAL LOW (ref 8.7–10.4)
CHLORIDE: 103 mmol/L (ref 98–107)
CHLORIDE: 104 mmol/L (ref 98–107)
CO2: 22 mmol/L (ref 20.0–31.0)
CO2: 23 mmol/L (ref 20.0–31.0)
CREATININE: 1.55 mg/dL — ABNORMAL HIGH (ref 0.73–1.18)
CREATININE: 1.32 mg/dL — ABNORMAL HIGH (ref 0.73–1.18)
EGFR CKD-EPI (2021) MALE: 60 mL/min/1.73m2 (ref >=60)
EGFR CKD-EPI (2021) MALE: 73 mL/min/1.73m2 (ref >=60)
GLUCOSE RANDOM: 133 mg/dL (ref 70–179)
GLUCOSE RANDOM: 146 mg/dL (ref 70–179)
POTASSIUM: 4.6 mmol/L (ref 3.4–4.8)
POTASSIUM: 4 mmol/L (ref 3.4–4.8)
SODIUM: 138 mmol/L (ref 135–145)
SODIUM: 140 mmol/L (ref 135–145)

## 2024-12-09 LAB — URINALYSIS WITH MICROSCOPY WITH CULTURE REFLEX PERFORMABLE
BILIRUBIN UA: NEGATIVE
GLUCOSE UA: NEGATIVE
KETONES UA: NEGATIVE
LEUKOCYTE ESTERASE UA: NEGATIVE
NITRITE UA: NEGATIVE
PH UA: 6 (ref 5.0–9.0)
PROTEIN UA: 30 — AB
RBC UA: <1 /HPF (ref <=3)
SPECIFIC GRAVITY UA: 1.016 (ref 1.003–1.030)
SQUAMOUS EPITHELIAL: <1 /HPF (ref 0–5)
UROBILINOGEN UA: <2
WBC UA: <1 /HPF (ref <=2)

## 2024-12-09 LAB — CBC
HEMATOCRIT: 47.4 % (ref 39.0–48.0)
HEMOGLOBIN: 16.1 g/dL (ref 12.9–16.5)
MEAN CORPUSCULAR HEMOGLOBIN CONC: 34 g/dL (ref 32.0–36.0)
MEAN CORPUSCULAR HEMOGLOBIN: 32.1 pg (ref 25.9–32.4)
MEAN CORPUSCULAR VOLUME: 94.2 fL (ref 77.6–95.7)
MEAN PLATELET VOLUME: 8.9 fL (ref 6.8–10.7)
PLATELET COUNT: 71 10*9/L — ABNORMAL LOW (ref 150–450)
RED BLOOD CELL COUNT: 5.04 10*12/L (ref 4.26–5.60)
RED CELL DISTRIBUTION WIDTH: 13.3 % (ref 12.2–15.2)
WBC ADJUSTED: 12.3 10*9/L — ABNORMAL HIGH (ref 3.6–11.2)

## 2024-12-09 LAB — TOXICOLOGY SCREEN, URINE
AMPHETAMINE SCREEN URINE: POSITIVE — AB
BARBITURATE SCREEN URINE: NEGATIVE
BENZODIAZEPINE SCREEN, URINE: NEGATIVE
BUPRENORPHINE, URINE SCREEN: NEGATIVE
CANNABINOID SCREEN URINE: NEGATIVE
COCAINE(METAB.)SCREEN, URINE: POSITIVE — AB
FENTANYL SCREEN, URINE: NEGATIVE
METHADONE SCREEN, URINE: NEGATIVE
OPIATE SCREEN URINE: NEGATIVE
OXYCODONE SCREEN URINE: NEGATIVE

## 2024-12-09 LAB — PERIPHERAL BLOOD SMEAR, PATH REVIEW

## 2024-12-09 LAB — CBC W/ AUTO DIFF
BASOPHILS ABSOLUTE COUNT: 0 10*9/L (ref 0.0–0.1)
BASOPHILS RELATIVE PERCENT: 0.4 %
EOSINOPHILS ABSOLUTE COUNT: 0 10*9/L (ref 0.0–0.5)
EOSINOPHILS RELATIVE PERCENT: 0 %
HEMATOCRIT: 44.2 % (ref 39.0–48.0)
HEMOGLOBIN: 15.2 g/dL (ref 12.9–16.5)
LYMPHOCYTES ABSOLUTE COUNT: 1.3 10*9/L (ref 1.1–3.6)
LYMPHOCYTES RELATIVE PERCENT: 11.3 %
MEAN CORPUSCULAR HEMOGLOBIN CONC: 34.4 g/dL (ref 32.0–36.0)
MEAN CORPUSCULAR HEMOGLOBIN: 32.1 pg (ref 25.9–32.4)
MEAN CORPUSCULAR VOLUME: 93.2 fL (ref 77.6–95.7)
MEAN PLATELET VOLUME: 8.4 fL (ref 6.8–10.7)
MONOCYTES ABSOLUTE COUNT: 0.9 10*9/L — ABNORMAL HIGH (ref 0.3–0.8)
MONOCYTES RELATIVE PERCENT: 7.7 %
NEUTROPHILS ABSOLUTE COUNT: 9.2 10*9/L — ABNORMAL HIGH (ref 1.8–7.8)
NEUTROPHILS RELATIVE PERCENT: 80.6 %
PLATELET COUNT: 66 10*9/L — ABNORMAL LOW (ref 150–450)
RED BLOOD CELL COUNT: 4.74 10*12/L (ref 4.26–5.60)
RED CELL DISTRIBUTION WIDTH: 13 % (ref 12.2–15.2)
WBC ADJUSTED: 11.4 10*9/L — ABNORMAL HIGH (ref 3.6–11.2)

## 2024-12-09 LAB — AMMONIA: AMMONIA: 27 umol/L (ref 11–32)

## 2024-12-09 LAB — DRUG SCREEN, URINE
AMPHETAMINE SCREEN URINE: POSITIVE — AB
BARBITURATE SCREEN URINE: NEGATIVE
BENZODIAZEPINE SCREEN, URINE: NEGATIVE
BUPRENORPHINE, URINE SCREEN: NEGATIVE
CANNABINOID SCREEN URINE: NEGATIVE
COCAINE(METAB.)SCREEN, URINE: POSITIVE — AB
FENTANYL SCREEN, URINE: NEGATIVE
METHADONE SCREEN, URINE: NEGATIVE
OPIATE SCREEN URINE: NEGATIVE
OXYCODONE SCREEN URINE: NEGATIVE

## 2024-12-09 LAB — APTT
APTT: 47.7 s — ABNORMAL HIGH (ref 24.8–38.4)
HEPARIN CORRELATION: 0.3

## 2024-12-09 LAB — PHOSPHORUS: PHOSPHORUS: 2.8 mg/dL (ref 2.4–5.1)

## 2024-12-09 LAB — CORTISOL: CORTISOL TOTAL: 37 ug/dL (ref >=1.5)

## 2024-12-09 LAB — PROTIME-INR
INR: 1.07
PROTIME: 12.2 s (ref 9.9–12.6)

## 2024-12-09 LAB — LACTATE SEPSIS, VENOUS 2: LACTATE BLOOD VENOUS: 1.6 mmol/L (ref 0.5–1.8)

## 2024-12-09 LAB — MAGNESIUM: MAGNESIUM: 2.2 mg/dL (ref 1.6–2.6)

## 2024-12-09 LAB — FOLATE: FOLATE: 16.3 ng/mL (ref >=5.4)

## 2024-12-09 LAB — LACTATE, VENOUS, WHOLE BLOOD
LACTATE BLOOD VENOUS: 1.5 mmol/L (ref 0.5–1.8)
LACTATE BLOOD VENOUS: 2.5 mmol/L — ABNORMAL HIGH (ref 0.5–1.8)
LACTATE BLOOD VENOUS: 3.1 mmol/L — ABNORMAL HIGH (ref 0.5–1.8)

## 2024-12-09 LAB — FIBRINOGEN: FIBRINOGEN LEVEL: 146 mg/dL — ABNORMAL LOW (ref 175–500)

## 2024-12-09 LAB — TSH: THYROID STIMULATING HORMONE: 0.404 u[IU]/mL — ABNORMAL LOW (ref 0.550–4.780)

## 2024-12-09 LAB — VITAMIN B12: VITAMIN B-12: 815 pg/mL (ref 211–911)

## 2024-12-09 LAB — VALPROIC ACID LEVEL, TOTAL: VALPROIC ACID TOTAL: 72 ug/mL (ref 50.0–100.0)

## 2024-12-09 LAB — HIGH SENSITIVITY TROPONIN I - SINGLE: HIGH SENSITIVITY TROPONIN I: 9 ng/L (ref <=53)

## 2024-12-09 MED ADMIN — levocarnitine (CARNITOR) (SF) oral solution: 500 mg | ORAL | @ 14:00:00

## 2024-12-09 MED ADMIN — clonazePAM (KlonoPIN) tablet 1 mg: 1 mg | ORAL | @ 14:00:00

## 2024-12-09 MED ADMIN — cetirizine (ZYRTEC) tablet 10 mg: 10 mg | ORAL | @ 14:00:00

## 2024-12-09 MED ADMIN — enoxaparin (LOVENOX) syringe 40 mg: 40 mg | SUBCUTANEOUS | @ 14:00:00

## 2024-12-09 MED ADMIN — folic acid (FOLVITE) tablet 1 mg: 1 mg | ORAL | @ 14:00:00

## 2024-12-09 MED ADMIN — lamoTRIgine (LaMICtal) tablet 200 mg: 200 mg | ORAL | @ 14:00:00

## 2024-12-09 MED ADMIN — famotidine (PF) (PEPCID) injection 20 mg: 20 mg | INTRAVENOUS | @ 19:00:00

## 2024-12-09 MED ADMIN — multivitamins, therapeutic with minerals tablet 1 tablet: 1 | ORAL | @ 14:00:00

## 2024-12-09 MED ADMIN — piperacillin-tazobactam (ZOSYN) IVPB 4.5 g in D5W 100 mL IVPB (premade): 4.5 g | INTRAVENOUS | @ 23:00:00 | Stop: 2024-12-15

## 2024-12-09 MED ADMIN — cenobamate (XCOPRI) tablet 200 mg: 200 mg | ORAL | @ 14:00:00

## 2024-12-09 MED ADMIN — lactated ringers bolus 1,000 mL: 1000 mL | INTRAVENOUS | @ 21:00:00 | Stop: 2024-12-09

## 2024-12-09 MED ADMIN — piperacillin-tazobactam (ZOSYN) 3.375 g in sodium chloride 0.9 % (NS) 100 mL IVPB-MBP: 3.375 g | INTRAVENOUS | @ 09:00:00 | Stop: 2024-12-09

## 2024-12-09 MED ADMIN — magnesium sulfate in water 2 gram/50 mL (4 %) IVPB 2 g: 2 g | INTRAVENOUS | @ 03:00:00 | Stop: 2024-12-09

## 2024-12-09 MED ADMIN — acetaminophen (TYLENOL) tablet 650 mg: 650 mg | ORAL | @ 07:00:00 | Stop: 2024-12-09

## 2024-12-09 MED ADMIN — lactated ringers bolus 1,000 mL: 1000 mL | INTRAVENOUS | @ 16:00:00 | Stop: 2024-12-09

## 2024-12-09 MED ADMIN — hydrocortisone sod succ (Solu-CORTEF) injection 200 mg: 200 mg | INTRAVENOUS | @ 08:00:00 | Stop: 2024-12-09

## 2024-12-09 MED ADMIN — vancomycin (VANCOCIN) 1500 mg in sodium chloride (NS) 0.9% 500 mL IVPB: 1500 mg | INTRAVENOUS | @ 04:00:00

## 2024-12-09 MED ADMIN — piperacillin-tazobactam (ZOSYN) IVPB 4.5 g in D5W 100 mL IVPB (premade): 4.5 g | INTRAVENOUS | @ 03:00:00 | Stop: 2024-12-08

## 2024-12-09 MED ADMIN — lactated ringers bolus 1,000 mL: 1000 mL | INTRAVENOUS | @ 02:00:00 | Stop: 2024-12-08

## 2024-12-09 MED ADMIN — NORepinephrine 8 mg in dextrose 5 % 250 mL (32 mcg/mL) infusion PMB: 0-30 ug/min | INTRAVENOUS | @ 08:00:00

## 2024-12-09 MED ADMIN — sodium chloride 0.9% (NS) bolus 1,000 mL: 1000 mL | INTRAVENOUS | @ 07:00:00 | Stop: 2024-12-09

## 2024-12-09 NOTE — Plan of Care (Signed)
 Problem: Adult Inpatient Plan of Care  Goal: Absence of Hospital-Acquired Illness or Injury  Intervention: Identify and Manage Fall Risk  Recent Flowsheet Documentation  Taken 12/09/2024 0441 by Franchot Marry LABOR, RN  Safety Interventions:   aspiration precautions   bleeding precautions   fall reduction program maintained   environmental modification   lighting adjusted for tasks/safety   low bed   nonskid shoes/slippers when out of bed  Intervention: Prevent Skin Injury  Recent Flowsheet Documentation  Taken 12/09/2024 0441 by Franchot Marry LABOR, RN  Positioning for Skin: Right  Device Skin Pressure Protection: absorbent pad utilized/changed  Skin Protection: adhesive use limited  Intervention: Prevent Infection  Recent Flowsheet Documentation  Taken 12/09/2024 0441 by Franchot Marry LABOR, RN  Infection Prevention:   cohorting utilized   hand hygiene promoted     Problem: Fall Injury Risk  Goal: Absence of Fall and Fall-Related Injury  Intervention: Promote Injury-Free Environment  Recent Flowsheet Documentation  Taken 12/09/2024 0441 by Franchot Marry LABOR, RN  Safety Interventions:   aspiration precautions   bleeding precautions   fall reduction program maintained   environmental modification   lighting adjusted for tasks/safety   low bed   nonskid shoes/slippers when out of bed     Problem: Skin Injury Risk Increased  Goal: Skin Health and Integrity  Intervention: Optimize Skin Protection  Recent Flowsheet Documentation  Taken 12/09/2024 0441 by Franchot Marry LABOR, RN  Activity Management: in bed  Pressure Reduction Techniques: frequent weight shift encouraged  Pressure Reduction Devices: pressure-redistributing mattress utilized  Skin Protection: adhesive use limited

## 2024-12-09 NOTE — Treatment Plan (Addendum)
 Date of Service: 12/09/24 9277               MICU Treatment Plan      Date of Service: 12/09/2024     Problem List:   Active Problems:    Seizures    (CMS-HCC)    Septic shock    (CMS-HCC)    Upper respiratory infection, viral    Infection due to human metapneumovirus (hMPV)        Austin Banks is a 34 y.o. 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 He was admitted to the MICU on 1/14 for hypotension and ongoing pressor requirements.     24hr events:   - On low dose norepi currently, MAPs stable  - No supplemental O2 requirement  - Parents, Austin Banks and Austin Banks, updated at bedside. They are aware of his substance use.      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 No witnessed seizure activity at home. Home anti-epileptics of Depakote , Xcopri , Lamictal , Tocilizumab , Klonopin , and Nayzilam  for rescue. Patient reports frequently missing doses of antiepileptic medications. Reports multiple falls without head strike or LOC. CT head negative for acute intracranial process. He has a low seizure threshold iso infection, substance use, and missed medication doses. Query if there may be component of stimulant withdrawal contributing to mental status and intermittent bradycardia.   - Neurology following, appreciate recs. Low concern for seizures at this time. Will assess with EEG.  - Follow-up Lamictal  and Valproate acid levels  - Continue home antiepileptic regimen  - RPR, HIV, B12, TSH ordered     Hx polysubstance use  Documented history of crack cocaine, tobacco, methamphetamine, and marijuana use. Urine tox in ED positive for amphetamines and cocaine. No evidence of alcohol use. Smoked and snorted amphetamines prior to admission. Parents at bedside are aware of his substance use.      Dysmetria  Altered Mental Status  Patient has been lethargic, somnolent, and globally slow to respond since admission. His responsiveness has improved since this morning. Parents noted he was having difficulty eating with a fork, frequently bringing food to the wrong part of his face. C/f low vision. CT head 1/13 showed mild brain volume loss of the cerebellum, a new finding compared to prior imaging in 01/2024. Bedside cerebellar exam could not be completed due to lethargy, somnolence. Pupils are small but equal and reactive to light.   - Neurology following, appreciate recs  - Continue to monitor with daily cerebellar, eye exam  - Serum ammonia wnl  - B12/folate pending  - PT/OT     Analgesia: No pain issues  RASS at goal? N/A, not on sedation  [place] Agitation Assessment Scale (RASS) : -1 (12/09/2024  4:41 AM)        Pulmonary   Viral upper respiratory infection, Metapneumovirus +  Presented with 2-3 days of general malaise, fevers, and productive cough. On arrival to ED patient had refractory hypotension requiring pressors (see below) and leukocytosis with WBC 13. CXR appears to be consistent with viral infection, however read is unable to exclude pneumonia. Currently not on supplemental O2 and satting  well.   - Monitor O2 needs, wean as able     O2 Flow Rate (L/min):  [2 L/min-3 L/min] 2 L/min     Cardiovascular   Undifferentiated shock - septic vs. hypovolemic secondary to Metapneumovirus vs pneumonia  Bradycardia - improved  Presented with hypotension refractory to 2 L IV fluids.  Lactate peaked at 3.7, now 2.5 s/p fluid. Started on pressors. He was septic by SIRS criteria on admission, currently does not meet these criteria. Clinical improvement s/p fluid boli consistent with hypovolemia. Earlier bradycardia <60bpm during unresponsive episode is inconsistent with physiologic response to shock, will evaluate with echo and new ECG. TSH low at 0.404, likely in setting of acute illness. AM cortisol WNL, lowering suspicion for adrenal insufficiency. Repeat ECG showed sinus bradycardia, final read pending. Echocardiography done, final read pending. Troponin wnl. Repeat CXR stable. Likely not cardiogenic shock given these findings and signs of good perfusion on physical exam.   - NE gtt, MAP goal >65  - Infectious workup, see ID section  - Pepcid for GI prophylaxis iso pressor requirement  - Follow up ECG final read  - Follow up echo final read     Renal   AKI - improving  Creatinine 2.13 at presentation (no history of prior renal dysfunction). Likely prerenal iso viral infection and multiple days of malaise and poor appetite. Given 2L IV fluids in the ED with subsequent improvement in Cr to 1.55. UA showing signs of tubular injury from AKI but otherwise not concerning for glomerular pathology or infection.   - Daily BMP  - 1L LR bolus     Infectious Disease/Autoimmune   Undifferentiated Shock - Septic vs. Hypovolemic  Metapneumovirus URI  ?Pneumonia, Aspiration  Currently not septic by SIRS criteria. Presented with multiple days of weakness, fevers, cough, hypotension, and leukocytosis WBC 13, improved to 11.4. Hypotension refractory to 2L IV fluids, ultimately requiring initiation of pressors. Infectious workup started in the ED due to persist hypotension and history of immunocompromise (takes Tocilizumab  for epilepsy). RPP positive for metapneumovirus. MRSA negative. CXR with concerns for atelectasis vs aspiration, pneumonia cannot be excluded. UA is not concerning for infectious pathology. Empirically treated with Vanc, Zosyn in ED.  - Daily CBC  - Follow-up 1/13 blood cultures  - Zosyn, Vanc for empiric pneumonia coverage  - Symptomatic treatment of viral URI  - NE gtt for hypotension as above (See Cardio section)  - Consider infectious disease consult given severity of septic shock and history of immunosuppression with Tocilizumab   - 1L LR bolus     Cultures:  No results found for: BLOOD CULTURE, BLOOD CULTURE, ROUTINE, URINE CULTURE, COMPREHENSIVE, URINE CULTURE, COMPREHENSIVE, LOWER RESPIRATORY CULTURE      WBC (10*9/L)   Date Value   12/09/2024 11.4 (H)          WBC, UA (/HPF)   Date Value   12/09/2024 <1             Sepsis Protocol Documentation        FEN/GI   NAI                Provider Malnutrition Assessment:  Body mass index is 19.92 kg/m??.  BMI Interpretation: within normal limits.  GLIM criteria:              Pt does not meet criteria  -I have screened this patient for malnutrition and they did NOT meet criteria for malnutrition based on GLIM criteria.  -Nutrition consulted no  RD assessment:Not  done yet.      Heme/Coag   Thrombocytopenia  Platelets 66. No prior recorded episodes of thrombocytopenia. LFT significant for mildly elevated AST. Low concern for active hemorrhage. Consider ITP iso recent viral infection; diagnosis of exclusion.   - Daily CBC, B12, Folate  - Peripheral smear     Endocrine   NAI     Integumentary   #  - WOCN consulted for high risk skin assessment No. Reason: NA.  - WOCN recs >> n/a  - cont pressure mitigating precautions per skin policy     Prophylaxis/LDA/Restraints/Consults   ICU Checklist completed: see ICU rounding navigator          Patient Lines/Drains/Airways Status         Active Active Lines, Drains, & Airways         Name Placement date Placement time Site Days     Peripheral IV 12/08/24 Right Antecubital 12/08/24  1955  Antecubital  less than 1     Peripheral IV 12/08/24 Anterior;Left Forearm 12/08/24  2154  Forearm  less than 1                       Patient Lines/Drains/Airways Status         Active Wounds         None                          Goals of Care      Code Status: Full Code     Designated Healthcare Decision Maker: His designated healthcare decision maker(s) is/are HCDM (patient stated preference): Able, Malloy - Father - [phone].     See HCDM section of Epic sidebar/storyboard or ACP tab in patient chart for details regarding active HCDMs and patient capacity for decision-making.        Subjective      On my interview, patient states he came to the hospital due to balance issues and had a fall, denies head strike and LOC. No symptoms prior to the fall, states he felt unbalanced and fell. Also reports general weakness, minimal appetite, nausea, and productive cough for the prior 2 days. No sick contacts. Denies numbness or tingling of hands/feet.     Patient is A&Ox3 and falls asleep during the interview but easily wakes up. He is unable to explain to me why he is in the hospital or admitted to the ICU. He is unable to provide me with his past medical history or medications. He states he does not miss medications doses. He reports current tobacco, marijuana, and cocaine use. Denies alcohol use.     Endorses mild SOB. No dysuria, abdominal pain, nausea or vomiting.      Allergies  [Allergies]    [Allergies]            Allergen Reactions    Lacosamide  Other (See Comments)       Psychosis     Hallucinations        Meds  [Meds ordered prior to current encounter]    [Meds ordered prior to current encounter]    No current facility-administered medications on file prior to encounter.           Current Outpatient Medications on File Prior to Encounter   Medication Sig    cetirizine  (ZYRTEC ) 10 MG tablet Take 1 tablet (10 mg total) by mouth every morning.    clonazePAM  (KLONOPIN ) 1 MG tablet TAKE 1 TABLET BY  MOUTH IN THE MORNING and TAKE 2 TABLETS AT BEDTIME (PER patient) may take additional tablets after a big seizure or 2 small one. no more than 4 tablets per day    divalproex  ER (DEPAKOTE  ER) 500 MG extended released 24 hr tablet TAKE 3 TABLETS BY MOUTH NIGHTLY    empty container Misc Use as directed to dispose of injections    folic acid  (FOLVITE ) 1 MG tablet Take 1 tablet (1 mg total) by mouth daily.    haloperidol  (HALDOL ) 5 MG tablet Take 3 tablets (15 mg total) by mouth nightly.    lamoTRIgine  (LAMICTAL ) 200 MG tablet TAKE 1 TABLET BY MOUTH IN THE MORNING and TAKE 1 TABLET AT BEDTIME    levocarnitine  500 mg Tab Take 1 tablet (500 mg) by mouth Two (2) times a day.    midazolam  (NAYZILAM ) 5 mg/spray (0.1 mL) Spry 1 spray (5 mg) into 1 nostril for a seizure 3 minutes long; May give 2nd dose after 10 minutes for recurrent seizures. do not give a second dose if the patient has trouble breathing, MAX- 2 sprays in 72hrs and 4 sprays a week.    multivit-min-iron  fum-folic ac 7.5 mg iron -400 mcg Tab Take 1 tablet by mouth daily.    naloxone  (NARCAN ) 4 mg nasal spray Give one nasal spray for mental status change due to excessive illicit drug use and call 911    tocilizumab  (ACTEMRA  ACTPEN) 162 mg/0.9 mL PnIj subcutaneous injection Inject the contents of 1 pen (162 mg) under the skin every seven (7) days.    XCOPRI  200 mg tablet TAKE 2 TABLETS BY MOUTH AT BEDTIME per patient    [DISCONTINUED] lacosamide  (VIMPAT ) 100 mg Tab Take 1 tablet (100 mg total) by mouth Two (2) times a day.        Past Medical History  [Past Medical History]    [Past Medical History]           Diagnosis Date    Fall       from medications    Hepatitis B surface antigen positive      Schizophrenia (CMS-HCC)      Seizure    (CMS-HCC)      TBI (traumatic brain injury) (CMS-HCC)      Vitamin D deficiency          Past Surgical History  [Past Surgical History]    [Past Surgical History]            Procedure Laterality Date    MANDIBLE FRACTURE SURGERY            Family History  [Family History]    [Family History]            Problem Relation Age of Onset    Hypertension Mother      Hypertension Father      Diabetes Father      Cancer Maternal Aunt      Cancer Maternal Grandmother      Cancer Paternal Grandfather      Seizures Neg Hx          Social History  [Social History ]    [Social History ]  Social History Socioeconomic History    Marital status: Single   Tobacco Use    Smoking status: Every Day       Current packs/day: 1.00       Average packs/day: 1 pack/day for 10.0 years (10.0 ttl pk-yrs)       Types: Cigarettes    Smokeless  tobacco: Never    Tobacco comments:       10-20cpd   Vaping Use    Vaping status: Never Used   Substance and Sexual Activity    Alcohol use: Not Currently       Comment: rare    Drug use: Yes       Frequency: 7.0 times per week       Types: Marijuana, Cocaine   Social History Narrative     Updated May 2023           PSYCHIATRIC HX:      -Prior Dx: Schizophrenia. Seizures started ~age 76 around same time of head injury (unknown mechanism), psychotic symptoms ~age 63 by chart review. Seizures intractible for many years, in 2021 was found to be GAD65+ in serum and CSF, has been receiving IVIG + cellcept  or rituximab since then. Historically in post-ictal state has had more psychotic symptoms/agitation.      -Hospitalizations: 10+ prior including at [facility]; most recent at [facility] in 2021, involuntarily on 3NSH due to psychosis. Last known in Nov 2022 at [facility] for delusions/AH     -Suicide attempts: denies      -Non-suicidal self-injury: denies       -Medication Trials: Zyprexa (stopped due to fear of increased seizure risk, was on doses as high as 40mg ), Risperdal  (and Zyprexa and risperidone  together at times), Geodon, abilify, Lamictal , Invega Sustenna x 2 months and stopped at [facility]. In spring 2023 admitted to Neuro on risperidone  3mg  BID, while hospitalized changed to haldol  5mg  BID (May 2023)     -Med compliance Hx: fair      -Current treatment: ACT team through [facility]     -Past Psych: [facility], [facility] in [place]. [facility]- many group homes and different ACT/CST in past           SUBSTANCE ABUSE HX:     # Marijuana: several times a day since age 50, reported use as recently as 2022     # Cocaine: prior use, occasionally. Last reported use ~2012     # Alcohol: Patient denies recent use. No known hx of Sz/DT/Complicated WD Hx, in 2016 drinking 2-3 drinks a day           Social Hx:     -Current Living Environment: Currently living with parents; previously at group homes; [facility] and [facility] in [place]     -Guardian: Father; Connor Meacham (home [phone]), cell [phone])     -Sexual orientation: openly homosexual to family from 2016, also has a child      -Education/School Performance: High school, regular classes      -Income/Employment: disability check; -dad is his payee     -Legal/School Discipline: breaking and entering, assault charges      -Abuse/Neglect/Trauma/DV: denies      -Violence (perp): previous assault charges      -Weapons Access: denies guns           FAMILY HX     -Mental Illness: denies      -Suicide: denies      -Substance Abuse: extensive history alcohol and recreational drug use      Social Drivers of Health           Food Insecurity: Food Insecurity Present (10/09/2023)     Received from [facility]     Hunger Vital Sign      Within the past 12 months, you worried that your  food would run out before you got the money to buy more.: Often true      Within the past 12 months, the food you bought just didn't last and you didn't have money to get more.: Sometimes true   Tobacco Use: High Risk (12/08/2024)     Patient History      Smoking Tobacco Use: Every Day      Smokeless Tobacco Use: Never   Transportation Needs: No Transportation Needs (10/09/2023)     Received from [facility] - Transportation      In the past 12 months, has lack of transportation kept you from medical appointments or from getting medications?: No      Lack of Transportation (Non-Medical): No   Alcohol Use: Not At Risk (07/01/2024)     Received from [facility]     AUDIT-C      Q1: How often do you have a drink containing alcohol?: Never      Q2: How many drinks containing alcohol do you have on a typical day when you are drinking?: Patient does not drink   Housing: Unknown (10/09/2023)     [facility]     Housing Stability Vital Sign      In the last 12 months, was there a time when you were not able to pay the mortgage or rent on time?: No      At any time in the past 12 months, were you homeless or living in a shelter (including now)?: No   Utilities: Not At Risk (10/09/2023)     Received from [facility]      Threatened with loss of utilities: No   Interpersonal Safety: Not At Risk (12/08/2024)     Interpersonal Safety      Unsafe Where You Currently Live: No      Physically Hurt by Anyone: No      Abused by Anyone: No   Financial Resource Strain: Low Risk  (10/09/2023)     Received from [facility]     Overall Financial Resource Strain (CARDIA)      Difficulty of Paying Living Expenses: Not very hard              Objective      Vitals - past 24 hours  Temp:  [37.1 ??C (98.8 ??F)-38.1 ??C (100.6 ??F)] 37.1 ??C (98.8 ??F)  Pulse:  [49-100] 49  SpO2 Pulse:  [50-89] 50  Resp:  [13-30] 17  BP: (79-126)/(49-79) 111/79  SpO2:  [91 %-100 %] 98 % Intake/Output  I/O last 3 completed shifts:  In: 1632.4 [I.V.:27.4; IV Piggyback:1605]  Out: 575 [Urine:575]      Physical Exam:    General: Lying in bed, no acute distress. Falls asleep during the interview but awakens to voice. Able to follow commands. Lethargic, somnolent.   HEENT: Normocephalic, atraumatic. Moist mucous membranes.  CV: Regular rate and rhythm. No M/R/G.  Pulm: Diffuse coarse breath sounds throughout lung fields.  GI: Non-distended, non-tender to palpation.  Skin: No rashes or wounds appreciated on clothed exam.  Neuro: A&Ox3. Moves all extremities spontaneously. Pupils small but equal and reactive to light.      Significant Comorbid Conditions   The following clinically significant conditions are present, requiring additional evaluation/treatment, and contribute to the patient's acuity and expected duration of hospitalization: - Probable Septic shock due to findings  of Presence of presumed infection requiring further investigation, treatment, or monitoring -- Present on admission  - Immunocompromised state POA requiring further investigation, treatment, or monitoring      Body mass index is 19.92 kg/m??.            Wt Readings from Last 12 Encounters:   12/08/24 68.5 kg (151 lb)   08/27/24 68.8 kg (151 lb 9.6 oz)   07/10/24 68 kg (150 lb)   02/11/24 74.4 kg (164 lb)   02/02/24 70.8 kg (156 lb 1.4 oz)   11/26/23 71.7 kg (158 lb 1.6 oz)   11/01/23 72.1 kg (159 lb)   09/06/23 66.5 kg (146 lb 9.6 oz)   04/16/23 80.5 kg (177 lb 6.4 oz)   04/11/23 81.6 kg (180 lb)   02/21/23 77.6 kg (171 lb)   01/24/23 79.4 kg (175 lb)         Continuous Infusions:   [Infusions Meds]    [Infusions Meds]     NORepinephrine bitartrate-NS 10 mcg/min (12/09/24 0720)        Scheduled Medications:   [Scheduled Medications]    [Scheduled Medications]     cenobamate   200 mg Oral Daily    cetirizine   10 mg Oral Q AM    clonazePAM   1 mg Oral Daily    clonazePAM   2 mg Oral Nightly    divalproex  ER  1,500 mg Oral Nightly    enoxaparin  (LOVENOX ) injection  40 mg Subcutaneous Q24H    folic acid   1 mg Oral Daily    haloperidol   15 mg Oral Nightly    lamoTRIgine   200 mg Oral BID    levocarnitine  (SF)  500 mg Oral BID    multivitamins (ADULT)  1 tablet Oral Daily        PRN medications:  [PRN Medications]    [PRN Medications]    acetaminophen , aluminum-magnesium  hydroxide-simethicone, guaiFENesin, melatonin, senna       Data/Imaging Review: Reviewed in Epic and personally interpreted on 12/09/2024. See EMR for detailed results.     Camil Craciunescu, MS4     I attest that I have reviewed the medical student note and that the components of the history of the present illness, the physical exam, and the assessment and plan documented were performed by me or were performed in my presence by the student where I verified the documentation and performed (or re-performed) the exam and medical decision making.   - Jayson Johnetta HERO.D.

## 2024-12-09 NOTE — H&P (Deleted)
 MICU Treatment Plan     Date of Service: 12/09/2024    Problem List:   Active Problems:    Seizures    (CMS-HCC)    Septic shock    (CMS-HCC)    Upper respiratory infection, viral    Infection due to human metapneumovirus (hMPV)      Austin Banks is a 34 y.o. 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 He was admitted to the MICU on 1/14 for hypotension and ongoing pressor requirements.    24hr events:   - On low dose norepi currently, MAPs stable  - No supplemental O2 requirement  - Parents, [name] and [place], updated at bedside. They are aware of his substance use.     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 No witnessed seizure activity at home. Home anti-epileptics of Depakote , Xcopri , Lamictal , Tocilizumab , Klonopin , and Nayzilam  for rescue. Patient reports frequently missing doses of antiepileptic medications. Reports multiple falls without head strike or LOC. CT head negative for acute intracranial process. He has a low seizure threshold iso infection, substance use, and missed medication doses. Query if there may be component of stimulant withdrawal contributing to mental status and intermittent bradycardia.   - Neurology following, appreciate recs. Low concern for seizures at this time. Will assess with EEG.  - Follow-up Lamictal  and Valproate acid levels  - Continue home antiepileptic regimen  - RPR, HIV, B12, TSH ordered    Hx polysubstance use  Documented history of crack cocaine, tobacco, methamphetamine, and marijuana use. Urine tox in ED positive for amphetamines and cocaine. No evidence of alcohol use. Smoked and snorted amphetamines prior to admission. Parents at bedside are aware of his substance use.     Dysmetria  Altered Mental Status  Patient has been lethargic, somnolent, and globally slow to respond since admission. His responsiveness has improved since this morning. Parents noted he was having difficulty eating with a fork, frequently bringing food to the wrong part of his face. C/f low vision. CT head 1/13 showed mild brain volume loss of the cerebellum, a new finding compared to prior imaging in 01/2024. Bedside cerebellar exam could not be completed due to lethargy, somnolence. Pupils are small but equal and reactive to light.   - Neurology following, appreciate recs  - Continue to monitor with daily cerebellar, eye exam  - Serum ammonia wnl  - B12/folate pending  - PT/OT    Analgesia: No pain issues  RASS at goal? N/A, not on sedation  [place] Agitation Assessment Scale (RASS) : -1 (12/09/2024  4:41 AM)       Pulmonary   Viral upper respiratory infection, Metapneumovirus +  Presented with 2-3 days of general malaise, fevers, and productive cough. On arrival to ED patient had refractory hypotension requiring pressors (see below) and leukocytosis with WBC 13. CXR appears to be consistent with viral infection, however read is unable to exclude pneumonia. Currently not on supplemental O2 and satting well.   - Monitor O2 needs, wean as able    O2 Flow Rate (L/min):  [2 L/min-3 L/min] 2 L/min    Cardiovascular  Undifferentiated shock - septic vs. hypovolemic secondary to Metapneumovirus vs pneumonia  Bradycardia - improved  Presented with hypotension refractory to 2 L IV fluids.  Lactate peaked at 3.7, now 2.5 s/p fluid. Started on pressors. He was septic by SIRS criteria on admission, currently does not meet these criteria. Clinical improvement s/p fluid boli consistent with hypovolemia. Earlier bradycardia <60bpm during unresponsive episode is inconsistent with physiologic response to shock, will evaluate with echo and new ECG. TSH low at 0.404, likely in setting of acute illness. AM cortisol WNL, lowering suspicion for adrenal insufficiency. Repeat ECG showed sinus bradycardia, final read pending. Echocardiography done, final read pending. Troponin wnl. Repeat CXR stable. Likely not cardiogenic shock given these findings and signs of good perfusion on physical exam.   - NE gtt, MAP goal >65  - Infectious workup, see ID section  - Pepcid for GI prophylaxis iso pressor requirement  - Follow up ECG final read  - Follow up echo final read    Renal   AKI - improving  Creatinine 2.13 at presentation (no history of prior renal dysfunction). Likely prerenal iso viral infection and multiple days of malaise and poor appetite. Given 2L IV fluids in the ED with subsequent improvement in Cr to 1.55. UA showing signs of tubular injury from AKI but otherwise not concerning for glomerular pathology or infection.   - Daily BMP  - 1L LR bolus    Infectious Disease/Autoimmune   Undifferentiated Shock - Septic vs. Hypovolemic  Metapneumovirus URI  ?Pneumonia, Aspiration  Currently not septic by SIRS criteria. Presented with multiple days of weakness, fevers, cough, hypotension, and leukocytosis WBC 13, improved to 11.4. Hypotension refractory to 2L IV fluids, ultimately requiring initiation of pressors. Infectious workup started in the ED due to persist hypotension and history of immunocompromise (takes Tocilizumab  for epilepsy). RPP positive for metapneumovirus. MRSA negative. CXR with concerns for atelectasis vs aspiration, pneumonia cannot be excluded. UA is not concerning for infectious pathology. Empirically treated with Vanc, Zosyn in ED.  - Daily CBC  - Follow-up 1/13 blood cultures  - Zosyn, Vanc for empiric pneumonia coverage  - Symptomatic treatment of viral URI  - NE gtt for hypotension as above (See Cardio section)  - Consider infectious disease consult given severity of septic shock and history of immunosuppression with Tocilizumab   - 1L LR bolus    Cultures:  No results found for: BLOOD CULTURE, BLOOD CULTURE, ROUTINE, URINE CULTURE, COMPREHENSIVE, URINE CULTURE, COMPREHENSIVE, LOWER RESPIRATORY CULTURE  WBC (10*9/L)   Date Value   12/09/2024 11.4 (H)     WBC, UA (/HPF)   Date Value   12/09/2024 <1           Sepsis Protocol Documentation      FEN/GI   NAI     Provider Malnutrition Assessment:  Body mass index is 19.92 kg/m??.  BMI Interpretation: within normal limits.  GLIM criteria:   Pt does not meet criteria  -I have screened this patient for malnutrition and they did NOT meet criteria for malnutrition based on GLIM criteria.  -Nutrition consulted no  RD assessment:Not done yet.         Heme/Coag   Thrombocytopenia  Platelets 66. No prior recorded episodes of thrombocytopenia. LFT significant for mildly elevated AST. Low concern for active hemorrhage. Consider ITP iso recent viral infection; diagnosis of exclusion.   - Daily CBC, B12, Folate  - Peripheral smear    Endocrine   NAI    Integumentary   #  -  WOCN consulted for high risk skin assessment No. Reason: NA.  - WOCN recs >> n/a  - cont pressure mitigating precautions per skin policy    Prophylaxis/LDA/Restraints/Consults   ICU Checklist completed: see ICU rounding navigator        Patient Lines/Drains/Airways Status       Active Active Lines, Drains, & Airways       Name Placement date Placement time Site Days    Peripheral IV 12/08/24 Right Antecubital 12/08/24  1955  Antecubital  less than 1    Peripheral IV 12/08/24 Anterior;Left Forearm 12/08/24  2154  Forearm  less than 1                  Patient Lines/Drains/Airways Status       Active Wounds       None                    Goals of Care     Code Status: Full Code    Designated Healthcare Decision Maker: His designated healthcare decision maker(s) is/are HCDM (patient stated preference): [place], [place] - Father - [phone].     See HCDM section of Epic sidebar/storyboard or ACP tab in patient chart for details regarding active HCDMs and patient capacity for decision-making.      Subjective     On my interview, patient states he came to the hospital due to balance issues and had a fall, denies head strike and LOC. No symptoms prior to the fall, states he felt unbalanced and fell. Also reports general weakness, minimal appetite, nausea, and productive cough for the prior 2 days. No sick contacts. Denies numbness or tingling of hands/feet.    Patient is A&Ox3 and falls asleep during the interview but easily wakes up. He is unable to explain to me why he is in the hospital or admitted to the ICU. He is unable to provide me with his past medical history or medications. He states he does not miss medications doses. He reports current tobacco, marijuana, and cocaine use. Denies alcohol use.    Endorses mild SOB. No dysuria, abdominal pain, nausea or vomiting.     Allergies  Allergies[1]    Meds  Meds ordered prior to current encounter[2]    Past Medical History  Past Medical History[3]    Past Surgical History  Past Surgical History[4]    Family History  Family History[5]    Social History  Social History [6]        Objective     Vitals - past 24 hours  Temp:  [37.1 ??C (98.8 ??F)-38.1 ??C (100.6 ??F)] 37.1 ??C (98.8 ??F)  Pulse:  [49-100] 49  SpO2 Pulse:  [50-89] 50  Resp:  [13-30] 17  BP: (79-126)/(49-79) 111/79  SpO2:  [91 %-100 %] 98 % Intake/Output  I/O last 3 completed shifts:  In: 1632.4 [I.V.:27.4; IV Piggyback:1605]  Out: 575 [Urine:575]     Physical Exam:    General: Lying in bed, no acute distress. Falls asleep during the interview but awakens to voice. Able to follow commands. Lethargic, somnolent.   HEENT: Normocephalic, atraumatic. Moist mucous membranes.  CV: Regular rate and rhythm. No M/R/G.  Pulm: Diffuse coarse breath sounds throughout lung fields.  GI: Non-distended, non-tender to palpation.  Skin: No rashes or wounds appreciated on clothed exam.  Neuro: A&Ox3. Moves all extremities spontaneously. Pupils small but equal and reactive to light.     Significant Comorbid Conditions   The following clinically significant conditions  are present, requiring additional evaluation/treatment, and contribute to the patient's acuity and expected duration of hospitalization: - Probable Septic shock due to findings of Presence of presumed infection requiring further investigation, treatment, or monitoring -- Present on admission  - Immunocompromised state POA requiring further investigation, treatment, or monitoring     Body mass index is 19.92 kg/m??.            Wt Readings from Last 12 Encounters:   12/08/24 68.5 kg (151 lb)   08/27/24 68.8 kg (151 lb 9.6 oz)   07/10/24 68 kg (150 lb)   02/11/24 74.4 kg (164 lb)   02/02/24 70.8 kg (156 lb 1.4 oz)   11/26/23 71.7 kg (158 lb 1.6 oz)   11/01/23 72.1 kg (159 lb)   09/06/23 66.5 kg (146 lb 9.6 oz)   04/16/23 80.5 kg (177 lb 6.4 oz)   04/11/23 81.6 kg (180 lb)   02/21/23 77.6 kg (171 lb)   01/24/23 79.4 kg (175 lb)       Continuous Infusions:   Infusions Meds[7]    Scheduled Medications:   Scheduled Medications[8]    PRN medications:  PRN Medications[9]    Data/Imaging Review: Reviewed in Epic and personally interpreted on 12/09/2024. See EMR for detailed results.    Camil Craciunescu, MS4    I attest that I have reviewed the medical student note and that the components of the history of the present illness, the physical exam, and the assessment and plan documented were performed by me or were performed in my presence by the student where I verified the documentation and performed (or re-performed) the exam and medical decision making.   - Jayson Lehmann M.D.                     [1]   Allergies  Allergen Reactions    Lacosamide  Other (See Comments)     Psychosis    Hallucinations   [2]   No current facility-administered medications on file prior to encounter.     Current Outpatient Medications on File Prior to Encounter   Medication Sig cetirizine  (ZYRTEC ) 10 MG tablet Take 1 tablet (10 mg total) by mouth every morning.    clonazePAM  (KLONOPIN ) 1 MG tablet TAKE 1 TABLET BY MOUTH IN THE MORNING and TAKE 2 TABLETS AT BEDTIME (PER patient) may take additional tablets after a big seizure or 2 small one. no more than 4 tablets per day    divalproex  ER (DEPAKOTE  ER) 500 MG extended released 24 hr tablet TAKE 3 TABLETS BY MOUTH NIGHTLY    empty container Misc Use as directed to dispose of injections    folic acid  (FOLVITE ) 1 MG tablet Take 1 tablet (1 mg total) by mouth daily.    haloperidol  (HALDOL ) 5 MG tablet Take 3 tablets (15 mg total) by mouth nightly.    lamoTRIgine  (LAMICTAL ) 200 MG tablet TAKE 1 TABLET BY MOUTH IN THE MORNING and TAKE 1 TABLET AT BEDTIME    levocarnitine  500 mg Tab Take 1 tablet (500 mg) by mouth Two (2) times a day.    midazolam  (NAYZILAM ) 5 mg/spray (0.1 mL) Spry 1 spray (5 mg) into 1 nostril for a seizure 3 minutes long; May give 2nd dose after 10 minutes for recurrent seizures. do not give a second dose if the patient has trouble breathing, MAX- 2 sprays in 72hrs and 4 sprays a week.    multivit-min-iron  fum-folic ac 7.5 mg iron -400 mcg Tab Take 1 tablet by  mouth daily.    naloxone  (NARCAN ) 4 mg nasal spray Give one nasal spray for mental status change due to excessive illicit drug use and call 911    tocilizumab  (ACTEMRA  ACTPEN) 162 mg/0.9 mL PnIj subcutaneous injection Inject the contents of 1 pen (162 mg) under the skin every seven (7) days.    XCOPRI  200 mg tablet TAKE 2 TABLETS BY MOUTH AT BEDTIME per patient    [DISCONTINUED] lacosamide  (VIMPAT ) 100 mg Tab Take 1 tablet (100 mg total) by mouth Two (2) times a day.   [3]   Past Medical History:  Diagnosis Date    Fall     from medications    Hepatitis B surface antigen positive     Schizophrenia (CMS-HCC)     Seizure    (CMS-HCC)     TBI (traumatic brain injury) (CMS-HCC)     Vitamin D deficiency    [4]   Past Surgical History:  Procedure Laterality Date    MANDIBLE FRACTURE SURGERY     [5]   Family History  Problem Relation Age of Onset    Hypertension Mother     Hypertension Father     Diabetes Father     Cancer Maternal Aunt     Cancer Maternal Grandmother     Cancer Paternal Grandfather     Seizures Neg Hx    [6]   Social History  Socioeconomic History    Marital status: Single   Tobacco Use    Smoking status: Every Day     Current packs/day: 1.00     Average packs/day: 1 pack/day for 10.0 years (10.0 ttl pk-yrs)     Types: Cigarettes    Smokeless tobacco: Never    Tobacco comments:     10-20cpd   Vaping Use    Vaping status: Never Used   Substance and Sexual Activity    Alcohol use: Not Currently     Comment: rare    Drug use: Yes     Frequency: 7.0 times per week     Types: Marijuana, Cocaine   Social History Narrative    Updated May 2023        PSYCHIATRIC HX:     -Prior Dx: Schizophrenia. Seizures started ~age 66 around same time of head injury (unknown mechanism), psychotic symptoms ~age 34 by chart review. Seizures intractible for many years, in 2021 was found to be GAD65+ in serum and CSF, has been receiving IVIG + cellcept  or rituximab since then. Historically in post-ictal state has had more psychotic symptoms/agitation.     -Hospitalizations: 10+ prior including at [facility]; most recent at [facility] in 2021, involuntarily on 3NSH due to psychosis. Last known in Nov 2022 at [facility] for delusions/AH    -Suicide attempts: denies     -Non-suicidal self-injury: denies      -Medication Trials: Zyprexa (stopped due to fear of increased seizure risk, was on doses as high as 40mg ), Risperdal  (and Zyprexa and risperidone  together at times), Geodon, abilify, Lamictal , Invega Sustenna x 2 months and stopped at [facility]. In spring 2023 admitted to Neuro on risperidone  3mg  BID, while hospitalized changed to haldol  5mg  BID (May 2023)    -Med compliance Hx: fair     -Current treatment: ACT team through [facility]    -Past Psych: [facility], [facility] in [place]. [facility]- many group homes and different ACT/CST in past        SUBSTANCE ABUSE HX:    # Marijuana: several times a day  since age 33, reported use as recently as 2022    # Cocaine: prior use, occasionally. Last reported use ~2012    # Alcohol: Patient denies recent use. No known hx of Sz/DT/Complicated WD Hx, in 2016 drinking 2-3 drinks a day         Social Hx:    -Current Living Environment: Currently living with parents; previously at group homes; [facility] and [facility] in [place]    -Guardian: Father; Grahm Etsitty (home [phone]), cell [phone])    -Sexual orientation: openly homosexual to family from 2016, also has a child     -Education/School Performance: High school, regular classes     -Income/Employment: disability check; -dad is his payee    -Legal/School Discipline: breaking and entering, assault charges     -Abuse/Neglect/Trauma/DV: denies     -Violence (perp): previous assault charges     -Weapons Access: denies guns        FAMILY HX    -Mental Illness: denies     -Suicide: denies     -Substance Abuse: extensive history alcohol and recreational drug use     Social Drivers of Health     Food Insecurity: Food Insecurity Present (10/09/2023)    Received from [facility]    Hunger Vital Sign     Within the past 12 months, you worried that your food would run out before you got the money to buy more.: Often true     Within the past 12 months, the food you bought just didn't last and you didn't have money to get more.: Sometimes true   Tobacco Use: High Risk (12/08/2024)    Patient History     Smoking Tobacco Use: Every Day     Smokeless Tobacco Use: Never   Transportation Needs: No Transportation Needs (10/09/2023)    Received from [facility] - Transportation     In the past 12 months, has lack of transportation kept you from medical appointments or from getting medications?: No     Lack of Transportation (Non-Medical): No   Alcohol Use: Not At Risk (07/01/2024)    Received from [facility]    AUDIT-C     Q1: How often do you have a drink containing alcohol?: Never     Q2: How many drinks containing alcohol do you have on a typical day when you are drinking?: Patient does not drink   Housing: Unknown (10/09/2023)    Received from [facility]    Housing Stability Vital Sign     In the last 12 months, was there a time when you were not able to pay the mortgage or rent on time?: No     At any time in the past 12 months, were you homeless or living in a shelter (including now)?: No   Utilities: Not At Risk (10/09/2023)    Received from [facility] Utilities     Threatened with loss of utilities: No   Interpersonal Safety: Not At Risk (12/08/2024)    Interpersonal Safety     Unsafe Where You Currently Live: No     Physically Hurt by Anyone: No     Abused by Anyone: No   Financial Resource Strain: Low Risk  (10/09/2023)    Received from [facility]    Overall Financial Resource Strain (CARDIA)     Difficulty of Paying Living Expenses:  Not very hard   [7]    NORepinephrine bitartrate-NS 10 mcg/min (12/09/24 0720)   [8]    cenobamate   200 mg Oral Daily    cetirizine   10 mg Oral Q AM    clonazePAM   1 mg Oral Daily    clonazePAM   2 mg Oral Nightly    divalproex  ER  1,500 mg Oral Nightly    enoxaparin  (LOVENOX ) injection  40 mg Subcutaneous Q24H    folic acid   1 mg Oral Daily    haloperidol   15 mg Oral Nightly    lamoTRIgine   200 mg Oral BID    levocarnitine  (SF)  500 mg Oral BID    multivitamins (ADULT)  1 tablet Oral Daily   [9] acetaminophen , aluminum-magnesium  hydroxide-simethicone, guaiFENesin, melatonin, senna

## 2024-12-09 NOTE — ED Progress Note (Signed)
 ED Progress Note    [IPASS]  Received sign out from previous provider.   Illness severity  Patient summary: Briefly, Austin Banks is a 34 y.o. male PMH and presentation as below in ED Course.    Action List: (See ED course below)  Situational Awareness: Anticipate Admit    ED Course as of 12/09/24 0551   Tue Dec 08, 2024   2313 33y hx autoimmune encephalitis, seizure disorder  Here w/ 2 days lethargy, cough, vomiting, fever  Poorly adherent to meds. Slow to respond, globally weak  --Neurology has low c/f seizure (says no need for EEG or MRI), low c/f CVA - will see and provide recommendations.  --S/p broad-spectrum abx.     Wed Dec 09, 2024   0003 Metapneumovirus(!): Detected   0124 XR Chest Portable  Mild left retrocardiac opacities. Atelectasis versus aspiration. Pneumonia cannot be excluded.     0125 Creatinine(!): 2.13  Baseline seems normal. Ordering 2nd L fluids.  Went to bedside as patient had low MAP. Patient sleeping on that side. Repeat appropriate.   9786 Clear per neurology. Confirmed nystagmus and overall bradyphrenia is patient's baseline. He does need medical admission given his new O2 requirement iso pneumonia iso metapneumovirus. Patient does meet severe sepsis criteria by vitals (fever, tachycardia, tachypnea, though his hypotension is baseline and resolved after midodrine), known source of infection, leukocytosis and elevated lactate.    Hi, I'd like to admit Austin Banks MRN [id number] in the ED for severe sepsis - pneumonia on XR and +metapneumovirus.     Paged MAO     0254 Patient diaphoretic, hypotensive with MAP<60.  Ordering steroids and levophed to maintain MAPS above 65.

## 2024-12-09 NOTE — Consults (Signed)
 Follow-up Inpatient Consult Note      Requesting Attending Physician:  Rosina Trenton Sauers, MD  Service Requesting Consult: Medical ICU (MDI)     Assessment and Plan       Austin Banks is a 34 y.o. male on whom I have been asked by Ashley Glyn Henderson, MD to consult for AMS given seizure history.    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  Currently managed at home on Depakote , Xcopri , Lamictal , Klonopin  and has Nayzilam  for rescue.  Presenting for 2 days of cough, fever, emesis, poor p.o. intake, malaise, and generalized weakness concerning for infection.  Patient also endorses ongoing substance use (methamphetamine). UDS positive for Meth and Cocaine. No reported breakthrough seizures, though patient does report frequent missed medication doses of his antiseizure medications.      Neurologic exam reassuring, though ill-appearing, his cognition is intact and he has no focal deficits.  Low concern for nonconvulsive seizure at this time.  No new headache, focal deficit, or meningismus concerning for CNS infection.  He does have increased risk for breakthrough seizure with lowered seizure threshold in setting of likely infection, substance use, and missed medications. Later morning he had an episode of unresponsiveness with associated hypotension and bradycardia which is unusual for epileptic seizure, though given lowered seizure threshold reasonable to monitor on EEG, tentatively till 1/15 if remains seizure free.     Repeat Depakote  level (true trough) 72 thus would continue home dose unchanged. Ammonia has been checked and is normal 27.     Recommendations:  -Follow-up on drug levels   -Continue home antiepileptic regimen as able  If unable to tolerate PO can transition depakote  to IV with assistance of pharmacy however other medications (Xcopri , lamictal ) do not have IV formulation thus in that case would consider NGT for enteral access  -No current indication for advanced imaging  -start video EEG  -Continued workup for infection per primary team    Patient was seen and discussed with Dr. Christiane who agrees with above assessment and plan. Findings, interpretations, and recommendations discussed with patient and primary team.    Austin Gervacio, MD  PGY-4 Neurology     HPI      Reason for Consult: History of epilepsy    Austin Banks is a 34 y.o.  male on whom I have been asked by Rosina Trenton Sauers, MD to consult for history of epilepsy.    IE 1/14:  -Has metapneumovirus   -Hypotensive at night high 70s, 88s, on levophed 8mcg, [place] 2-3L  -VPA 2211 101.2 (H) but this is unlikely to be a true trough level, never got medicine in ED, unclear if he took It at home    -Episode of unresponsiveness later in the morning, patient reportedly very encephalopathic with low BP, bradycardia and not responding which self resolved in <10 minutes    Initial HPI:  History of epilepsy since age 43 around the time of a TBI though differential of autoimmune epilepsy given CSF/serum gad 65.  He has well-controlled at home on Depakote , Xcopri , Lamictal , Klonopin .    Presents today for 2 days of increased cough, nausea, vomiting, malaise with generalized weakness.  Patient states he tried ice (methamphetamine via inhalation) a few days ago and blames his current symptoms on this.  Father at bedside reports no witnessed seizures and patient thinks he may have had a seizure in the past week though he does  not remember and per chart review he is amnestic to his events. He says he is generally good about taking his medications but will miss a dose here and there, maybe 1-2 times weekly but denies back-to-back missed doses leading up to current admission.    Per family at bedside his current mental state is not in line with his typical ictal or postictal states.    Known semiologies:  1) blinking, left gaze, left facial twitching, LUE flexion, LLE tonic-clonic to generalized. Post-ictal confusion up to 20 minutes  2) left gaze, behavioral arrest with post-ictal confusional state often with attempt to remove clothes lasting about 10 minutes  -No prior EEG correlate to event of cold sensation, feeling like a seizure is coming on, delayed responsiveness with intact responses and commands    Per review of outpatient and prior inpatient notes, no known history of electrographic only seizures  Allergies[1]  Current Medications[2]  Past Medical History[3]  Past Surgical History[4]  Social History[5]  Family History[6]  Code Status: Full Code     Review of Systems   A 10-system review of systems was conducted and was negative except as documented above in the HPI.      Objective    Temp:  [36.5 ??C (97.7 ??F)-38.1 ??C (100.6 ??F)] 36.5 ??C (97.7 ??F)  Pulse:  [44-100] 71  SpO2 Pulse:  [48-89] 70  Resp:  [9-30] 27  BP: (79-126)/(49-96) 101/61  SpO2:  [88 %-100 %] 96 %    General Appearance:Ill-appearing, in no acute distress   HEENT: Head is atraumatic and normocephalic. Sclera anicteric without injection. Oropharyngeal membranes are moist with no erythema or exudate.  Cardiopulmonary: Regular rate and rhythm. Hypotensive. On RA without accessory muscle use  Extremities: No clubbing, cyanosis, or edema.    Neurological Examination:   Mental Status: Drowsy but awakens, follows all simple commands,slightly slowed responses improved with ongoing stimuli. Oriented to self, year, month, and close to location [facility] ED instead of [place]). Answers simple and complex questions and follows all commands appropriately. Language fluent with mild dysarthria. Memory for recent events intact. Reports inhaling Meth yesterday     Cranial Nerves: Fields intact. PERRL. Restricted left gaze though crosses midline and can nearly bury with right-beating nystagmus on left gaze (previously documented), other EOM intact. Face symmetric at rest and with activation. Hearing intact. Tongue midline.    Motor Exam: Normal bulk.  No tremors, myoclonus, or other adventitious movement.  UE R/L: lateral deltoid (shoulder abduction) 5/5, biceps 5/5, triceps 5/5, and hand grip mildly weak/mildly weak   LE R/L: hip flexion 5/5 and plantar flexion 5/5.    Reflexes: Deferred.    Sensory: Sensation normal to light touch in both hands and both feet    Cerebellar/Coordination/Gait: Finger-to-nose is normal without ataxia or dysmetria bilaterally. Gait not assessed.      Diagnostic Studies    EMU 01/2022: electroclinical seizure with rapid blinking -> left gaze -> left tonic -> GTC  cvEEG 03/29/22: rare bitemporal sharps, no seizure  rEEG 02/02/24: normal  MRI 08/2024 stable cerebellar atrophy    Imaging and relevant diagnostic studies reviewed and interpreted, pertinent findings as in above assessment and plan.           [1]   Allergies  Allergen Reactions    Lacosamide  Other (See Comments)     Psychosis    Hallucinations   [2]   Current Facility-Administered Medications:     acetaminophen  (TYLENOL ) tablet 650 mg, 650 mg, Oral, Q6H  PRN, Vilinda Alm CROME, MD    aluminum-magnesium  hydroxide-simethicone (MAALOX MAX) 80-80-8 mg/mL oral suspension, 30 mL, Oral, Q4H PRN, Cookmeyer, Alm CROME, MD    cenobamate  (XCOPRI ) tablet 200 mg, 200 mg, Oral, Daily, Cookmeyer, Alm CROME, MD, 200 mg at 12/09/24 0830    cetirizine  (ZYRTEC ) tablet 10 mg, 10 mg, Oral, Q AM, Cookmeyer, Alm CROME, MD, 10 mg at 12/09/24 0830    clonazePAM  (KlonoPIN ) tablet 1 mg, 1 mg, Oral, Daily, Cookmeyer, Alm CROME, MD, 1 mg at 12/09/24 0830    clonazePAM  (KlonoPIN ) tablet 2 mg, 2 mg, Oral, Nightly, Cookmeyer, Alm CROME, MD    divalproex  ER (DEPAKOTE  ER) extended released 24 hr tablet 1,500 mg, 1,500 mg, Oral, Nightly, Cookmeyer, Alm CROME, MD    enoxaparin  (LOVENOX ) syringe 40 mg, 40 mg, Subcutaneous, Q24H, Cookmeyer, Alm CROME, MD, 40 mg at 12/09/24 0830    famotidine (PF) (PEPCID) injection 20 mg, 20 mg, Intravenous, BID, Johnetta Savant, MD, 20 mg at 12/09/24 1416 folic acid  (FOLVITE ) tablet 1 mg, 1 mg, Oral, Daily, Cookmeyer, Alm CROME, MD, 1 mg at 12/09/24 0830    guaiFENesin (ROBITUSSIN) oral syrup, 200 mg, Oral, Q4H PRN, Cookmeyer, Alm CROME, MD    haloperidol  (HALDOL ) tablet 15 mg, 15 mg, Oral, Nightly, Cookmeyer, Alm CROME, MD    lamoTRIgine  (LaMICtal ) tablet 200 mg, 200 mg, Oral, BID, Cookmeyer, Alm CROME, MD, 200 mg at 12/09/24 9143    levocarnitine  (CARNITOR ) (SF) oral solution, 500 mg, Oral, BID, Cookmeyer, Alm CROME, MD, 500 mg at 12/09/24 0856    melatonin tablet 3 mg, 3 mg, Oral, Nightly PRN, Cookmeyer, Alm CROME, MD    multivitamins, therapeutic with minerals tablet 1 tablet, 1 tablet, Oral, Daily, Cookmeyer, Alm CROME, MD, 1 tablet at 12/09/24 0830    NORepinephrine 8 mg in dextrose  5 % 250 mL (32 mcg/mL) infusion PMB, 0-30 mcg/min, Intravenous, Continuous, Cookmeyer, Alm CROME, MD, Last Rate: 3.8 mL/hr at 12/09/24 1500, 2 mcg/min at 12/09/24 1500    piperacillin-tazobactam (ZOSYN) IVPB 4.5 g in D5W 100 mL IVPB (premade), 4.5 g, Intravenous, Q6H, Johnetta Savant, MD, Last Rate: 260 mL/hr at 12/09/24 1800, 4.5 g at 12/09/24 1800    senna (SENOKOT) tablet 2 tablet, 2 tablet, Oral, Nightly PRN, Cookmeyer, Alm CROME, MD  [3]   Past Medical History:  Diagnosis Date    Fall     from medications    Hepatitis B surface antigen positive     Schizophrenia (CMS-HCC)     Seizure    (CMS-HCC)     TBI (traumatic brain injury) (CMS-HCC)     Vitamin D deficiency    [4]   Past Surgical History:  Procedure Laterality Date    MANDIBLE FRACTURE SURGERY     [5]   Social History  Socioeconomic History    Marital status: Single     Spouse name: None    Number of children: None    Years of education: None    Highest education level: None   Tobacco Use    Smoking status: Every Day     Current packs/day: 1.00     Average packs/day: 1 pack/day for 10.0 years (10.0 ttl pk-yrs)     Types: Cigarettes    Smokeless tobacco: Never    Tobacco comments:     10-20cpd   Vaping Use    Vaping status: Never Used Substance and Sexual Activity    Alcohol use: Not Currently     Comment: rare    Drug use: Yes  Frequency: 7.0 times per week     Types: Marijuana, Cocaine   Social History Narrative    Updated May 2023        PSYCHIATRIC HX:     -Prior Dx: Schizophrenia. Seizures started ~age 55 around same time of head injury (unknown mechanism), psychotic symptoms ~age 47 by chart review. Seizures intractible for many years, in 2021 was found to be GAD65+ in serum and CSF, has been receiving IVIG + cellcept  or rituximab since then. Historically in post-ictal state has had more psychotic symptoms/agitation.     -Hospitalizations: 10+ prior including at [facility]; most recent at [facility] in 2021, involuntarily on 3NSH due to psychosis. Last known in Nov 2022 at [facility] for delusions/AH    -Suicide attempts: denies     -Non-suicidal self-injury: denies      -Medication Trials: Zyprexa (stopped due to fear of increased seizure risk, was on doses as high as 40mg ), Risperdal  (and Zyprexa and risperidone  together at times), Geodon, abilify, Lamictal , Invega Sustenna x 2 months and stopped at [facility]. In spring 2023 admitted to Neuro on risperidone  3mg  BID, while hospitalized changed to haldol  5mg  BID (May 2023)    -Med compliance Hx: fair     -Current treatment: ACT team through [facility]    -Past Psych: [facility], [facility] in [place]. [facility]- many group homes and different ACT/CST in past        SUBSTANCE ABUSE HX:    # Marijuana: several times a day since age 14, reported use as recently as 2022    # Cocaine: prior use, occasionally. Last reported use ~2012    # Alcohol: Patient denies recent use. No known hx of Sz/DT/Complicated WD Hx, in 2016 drinking 2-3 drinks a day         Social Hx:    -Current Living Environment: Currently living with parents; previously at group homes; [facility] and [facility] in [place]    -Guardian: Father; Tradarius Reinwald (home [phone]), cell [phone])    -Sexual orientation: openly homosexual to family from 2016, also has a child     -Education/School Performance: High school, regular classes     -Income/Employment: disability check; -dad is his payee    -Legal/School Discipline: breaking and entering, assault charges     -Abuse/Neglect/Trauma/DV: denies     -Violence (perp): previous assault charges     -Weapons Access: denies guns        FAMILY HX    -Mental Illness: denies     -Suicide: denies     -Substance Abuse: extensive history alcohol and recreational drug use     Social Drivers of Health     Food Insecurity: Food Insecurity Present (10/09/2023)    Received from [facility]    Hunger Vital Sign     Within the past 12 months, you worried that your food would run out before you got the money to buy more.: Often true     Within the past 12 months, the food you bought just didn't last and you didn't have money to get more.: Sometimes true   Tobacco Use: High Risk (12/09/2024)    Patient History     Smoking Tobacco Use: Every Day     Smokeless Tobacco Use: Never   Transportation Needs: No Transportation Needs (10/09/2023)    Received from [facility] - Transportation     In the past 12 months, has lack of transportation  kept you from medical appointments or from getting medications?: No     Lack of Transportation (Non-Medical): No   Alcohol Use: Not At Risk (07/01/2024)    Received from [facility]    AUDIT-C     Q1: How often do you have a drink containing alcohol?: Never     Q2: How many drinks containing alcohol do you have on a typical day when you are drinking?: Patient does not drink   Housing: Unknown (10/09/2023)    Received from [facility]    Housing Stability Vital Sign     In the last 12 months, was there a time when you were not able to pay the mortgage or rent on time?: No     At any time in the past 12 months, were you homeless or living in a shelter (including now)?: No Utilities: Not At Risk (10/09/2023)    Received from [facility] Utilities     Threatened with loss of utilities: No   Interpersonal Safety: Not At Risk (12/08/2024)    Interpersonal Safety     Unsafe Where You Currently Live: No     Physically Hurt by Anyone: No     Abused by Anyone: No   Financial Resource Strain: Low Risk  (10/09/2023)    Received from [facility]    Overall Financial Resource Strain (CARDIA)     Difficulty of Paying Living Expenses: Not very hard   [6]   Family History  Problem Relation Age of Onset    Hypertension Mother     Hypertension Father     Diabetes Father     Cancer Maternal Aunt     Cancer Maternal Grandmother     Cancer Paternal Grandfather     Seizures Neg Hx

## 2024-12-09 NOTE — Consults (Signed)
 PHYSICAL THERAPY  Evaluation (12/09/24 0809)          Patient Name:  Austin Banks       Medical Record Number: [id number]   Date of Birth: 10-05-1991  Sex: Male        Post-Discharge Physical Therapy Recommendations:  PT Post Acute Discharge Recommendations: 5x weekly, High intensity      Equipment Recommendation  PT DME Recommendations: Defer to post acute          Treatment Diagnosis: Impaired balance, Abnormalities of gait and mobility, Difficulty in walking, Unsteadiness on feet        ASSESSMENT  Problem List: Decreased cognition, Decreased coordination, Decreased endurance, Decreased mobility, Decreased safety awareness, Impaired balance, Impaired ADLs, Gait deviation, Fall risk, Decreased strength, Impaired vision      Assessment : Austin Banks is a 34 y.o. 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 Pt presents to PT with deficits in functional mobility 2/2 impaired balance and gait instability, decreased endurance, impaired coordination, further limited by impulsivity and decreased safety awareness. Pt will benefit from skilled acute PT and post-acute PT 5x/wk(high) to address deficits and maximize functional indep and safety prior to return home.      Today's Interventions: Eval, ther act, transfers and ambulation, standing balance, pt ed: role of PT, POC, assist for all mobility at this time, all questions answered.     Personal Factors/Comorbidities Present: 3+ factors   Examination of Body systems: 4+ elements  Clinical Presentation: Unstable    Eval Complexity : High Complexity     Activity Tolerance: Tolerated treatment well       PLAN  Planned Frequency of Treatment: Plan of Care Initiated: 12/09/24  1-2x per day Weekly Frequency: 3-4 days per week  Planned Treatment Duration: 12/23/24     Planned Interventions: Education (Patient/Family/Caregiver), Therapeutic Activity, Gait training, Home exercise program, Self-care / Home Management training, Therapeutic Exercise     Goals:   Patient and Family Goals: not stated     SHORT GOAL #1: Pt will perform all transfers indep.               Time Frame : 2 weeks  SHORT GOAL #2: Pt will ambulate 200 ft mod indep with or without LRAD.              Time Frame : 2 weeks  SHORT GOAL #3: Pt will negotiate 3 steps mod indpe with LRAD, no railings.              Time Frame : 2 weeks                       Long Term Goal #1: Pt will score 24/24 on AMPAC  Time Frame: 2 months     Prognosis:  Good  Positive Indicators: participation  Barriers to Discharge: Cognitive deficits, Decreased caregiver support, Decreased safety awareness, Functional strength deficits, Endurance deficits, Inability to safely perform ADLS, Impaired Balance, Severity of deficits, Gait instability     SUBJECTIVE  Communication Preference: Verbal     Patient reports: Pt/RN agreeable to PT  Pain Comments: denies pain, reports tingling in nose that is bothering him  Prior Functional Status: PTA, pt was indep with mobility and ADL wtihout device, does not drive. 1 fall leading to this admission, seizure around Christmastime as well.  Living Situation  Living Environment: Apartment  Lives With: Alone  Home Living: One level home, Stairs to enter without rails, Tub/shower unit, Shower chair with back, Grab bars in shower, Standard height toilet, Grab bars around toilet  Number of Stairs to Enter (outside): 3      Equipment available at home:  (shower chair)        Past Medical History[1]         Social History     Tobacco Use    Smoking status: Every Day     Current packs/day: 1.00     Average packs/day: 1 pack/day for 10.0 years (10.0 ttl pk-yrs)     Types: Cigarettes    Smokeless tobacco: Never    Tobacco comments:     10-20cpd   Substance Use Topics    Alcohol use: Not Currently     Comment: rare       Past Surgical History[2]          Family History[3]     Allergies: Lacosamide                   Objective Findings  Precautions / Restrictions  Precautions: Falls precautions  Required Braces or Orthoses: Non-applicable     Medical Tests / Procedures: vitals, labs, orders, imaging reviewed in Epic  Equipment / Environment: Vascular access (PIV, TLC, Port-a-cath, PICC), Telemetry     Vitals/Orthostatics : RN weaned NE from 10 to 8 prior to mobility. Rest: HR 63, SpO2 97% on RA. Activity: VSS t/o on RA. BP supine 134/80, Sitting 122/79, standing 109/71, supine 107/87. Episode of bradycardia (40s) after several minutes of resting in supine at conclusion of session, RN made aware.     Cognition comment: follows simple commands with some addl cueing, demonstrates impulsivity, mumbled speech.  Orientation: Disoriented to situation  Visual/Perception: Impaired/Limited  Visual/Perception comment: endorses diplopia in B eyes, reports he has glasses but won't wear them (associates them with trauma from time in psych hospital)  Hearing: No deficit identified     Skin Inspection: Intact where visualized     Upper Extremities  UE ROM: Right WFL, Left WFL  UE Strength: Right WFL, Left WFL    Lower Extremities  LE ROM: Right WFL, Left WFL  LE Strength: Right Impaired/Limited, Left Impaired/Limited  RLE Strength Impairment: Reduced strength, Reduced coordination  LLE Strength Impairment: Reduced strength, Reduced coordination          Sensation:  (endorses tingling in nose)  Posture: WFL    Static Sitting-Level of Assistance: Standby Camera Operator of Assistance: Magazine Features Editor Standing-Level of Assistance: Minimum assistance  Standing Balance comments: HHA      Bed Mobility        Supine to Sit assistance level: Contact guard assist, steadying assist  Sit to Supine assistance level: Contact guard assist, steadying assist       Transfers  Sit to Stand assistance level: Contact guard assist, steadying assist     Transfer comments: x3 trials with CGA, minA to correct posterior LOB upon coming to standing    Ambulation  Level of Assistance: Minimal assist, patient does 75% or more  Assistive Device: Other (Comment) (HHA)   Distance Ambulated (ft): 40 ft   Ambulation comments: amb ~40 ft within room with minA HHA, initially  with very small shuffle steps, unsteady, progressing to increased step length and gait stability with cueing.       Stairs:            Endurance: decreased    Patient at end of session: All needs in reach            AM-PAC-6 click  Help currently need turning over In bed?: None - Modified Independent/Independent  Help currently needed sitting down/standing up from chair with arms? : A Little - Minimal/Contact Guard Assist/Supervision  Help currently needed moving from supine to sitting on edge of bed?: A Little - Minimal/Contact Guard Assist/Supervision  Help currently needed moving to and from bed from wheelchair?: A Little - Minimal/Contact Guard Assist/Supervision  Help currently needed walking in a hospital room?: A Little - Minimal/Contact Guard Assist/Supervision  Help currently needed climbing 3-5 steps with railing?: Unable to do/total assistance - Total Dependent Assist    Basic Mobility Score 6 click: 17    6 click Score (in points): % of Functional Impairment, Limitation, Restriction  6: 100% impaired, limited, restricted  7-8: At least 80%, but less than 100% impaired, limited restricted  9-13: At least 60%, but less than 80% impaired, limited restricted  14-19: At least 40%, but less than 60% impaired, limited restricted  20-22: At least 20%, but less than 40% impaired, limited restricted  23: At least 1%, but less than 20% impaired, limited restricted  24: 0% impaired, limited restricted    'AM-PAC' forms are Copyright protected by [employer]     Physical Therapy Session Duration  PT Individual [mins]: 15  PT Co-Treatment [mins]: 30 (seen with Sarah Hillegass OT)  Reason for Co-treatment: Poor activity tolerance    I attest that I have reviewed the above information.  Signed: Augustin SHAUNNA Lighter, PT  Filed 12/09/2024          [1]   Past Medical History:  Diagnosis Date    Fall     from medications    Hepatitis B surface antigen positive     Schizophrenia (CMS-HCC)     Seizure    (CMS-HCC)     TBI (traumatic brain injury) (CMS-HCC)     Vitamin D deficiency    [2]   Past Surgical History:  Procedure Laterality Date    MANDIBLE FRACTURE SURGERY     [3]   Family History  Problem Relation Age of Onset    Hypertension Mother     Hypertension Father     Diabetes Father     Cancer Maternal Aunt     Cancer Maternal Grandmother     Cancer Paternal Grandfather     Seizures Neg Hx

## 2024-12-09 NOTE — Hospital Course (Addendum)
 Austin Banks is a 34 yo M with past medical history of autoimmune encephalitis and epilepsy (on maintenance tocilizumab  and AED regimen), polysubstance use disorder (cocaine, marijuana, methamphetamine) who was admitted to the MICU 12/08/24 with undifferentiated shock in the setting of metapneumovirus infection. Transferred out of MICU 1/22. He was placed under IVC after an episode of agitation and trying to leave the hospital from 1/21-1/26, at time of which it was released given improvement to psychiatric baseline.      Undifferentiated Shock - Metapneumovirus + - Possible Dysautonomia:   Patient presented with undifferentiated shock.  Presumed secondary to metapneumovirus, possible aspiration but felt less likely. Blood cultures negative. UA without infection. RUQ US  without evidence of infection. Completed a 7 day course of antibiotics (vanc/zosyn  initially then ceftriaxone ).  He has been afebrile since 12/09/24.  Ultimately there was concern that he has some baseline dysautonomia related to his underlying neurologic diagnoses.  He was weaned off of pressors after initiation of midodrine , after which blood pressures have remained normal range.  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  Some somnolence at baseline too, possibly related to medications, but certainly worsened on presentation. CT head was negative for acute intracranial process. MRI brain with diffuse cortical and cerebellar atrophy advanced for age but no acute abnormalities. EEG without seizure activity. Valproate level therapeutic.  Neurology was following and ultimately recommended continuing home medications.  Psychiatry also consulted and feel that likely patient's baseline somnolence is multifactorial with chronic neurological conditions, sedating effects of his necessary medications, acute illness, cocaine withdrawal.  Additional considerations could be reactivation of encephalitis, ADEM 2/2 to viral infection although these seem less likely and patient is improving overall.  TSH low but free T4 reassuring (euthryoid sick syndrome), RPR and HIV non-reactive.  Of note patient has had persistent bilateral nystagmus during his hospital stay per his baseline.  - s/p Thiamine  200 mg x 5 d then 100 mg daily   - Seizure precautions   - Continue home tocilizumab  every 7 days on Fridays  - Continue home divalproate 1500 mg nightly  - Continue home levocarnitine  500 mg BID   - Continue home lamotrigine  200 mg BID   - Continue home clonazepam  1 mg AM and 2 mg PM   - Continue home cebobamate 400 mg daily   - PT/OT following - recommended AIR placement, however patient declined. Instead arranged for home PT/OT through home health.     History of Polysubstance Use - Schizophrenia - Agitation - IVC on 12/16/24:   Patient has a history of schizophrenia followed by ACT team in the community and also a documented history of crack cocaine, tobacco, methamphetamine, and marijuana use. UTox this admission positive for amphetamines and cocaine. Negative ethanol on admission with low CIWA scores. Parents aware of his substance use. Initially somnolent as noted above but developed agitation on 12/16/24 (outside of the alcohol withdrawal window).  Suspect patient's somnolence was simply resolving and he wished to leave despite not being medically cleared to do so.  Seen by psychiatry when attempting to leave [facility] 12/16/24 and an IVC was placed.  He was continued on his home haldol  15 mg nightly, and after the one agitation episode, otherwise remained calm without a change in his medication regimen and  with no further agitation prn's needed.  On day of discharge, 1/26, he was felt to be at his psychiatric baseline (still with occasional hallucinations of seeing wings and some paranoia) and IVC was released. Parents in agreement and picked him up.  Voicemail for warm handoff was left for his ACT team.     Failure to Thrive:   Patient has had a 25 lb weight loss over the past year. Nutrition consultation with recommendations for regular diet with folic acid , thiamine , MVI and high protein supplements. Vitamin A and E normal range. Manganese normal.  Low serum copper level (uncertain significance clinically)  Normal b12 and folate.   - Continue thiamine  100 mg daily   - Continue multivitamin daily   - Continue folic acid  1 mg daily   - Continue famotidine  20 mg BID       Thrombocytopenia (resolved)  Thrombocytopenia nadired in 40s, now recovered to normal.  B12 and folate normal range. No known liver disease. No evidence of DIC. Most likely secondary to bone marrow suppression in acute illness.

## 2024-12-09 NOTE — Consults (Signed)
 Case Management Brief Assessment      General:  Care Manager / Social Worker assessed the patient by : Medical record review, Discussion with Clinical Care team    Extended Emergency Contact Information  Primary Emergency Contact: [facility]  Address: [address]           [place], [place] [address] [place]  Home Phone: [phone]  Mobile Phone: [phone]  Relation: Father  Secondary Emergency Contact: Rebecca Slough  Address: [address]           [place], [place] [address] [place]  Home Phone: [phone]  Mobile Phone: [phone]  Relation: Mother      Financial Information:  Need for financial assistance?: No         Discharge Needs:  Concerns to be Addressed: discharge planning           Discharge Plan:       Estimated Discharge Date: 12/14/2024    Initial Assessment complete?: Yes        Additional Information:    HCDM (patient stated preference): Austin, Banks - Father - [phone]    HCDM (patient stated preference): Austin, Banks - Mother - [phone]    Social Drivers of Health     Food Insecurity: Food Insecurity Present (10/09/2023)    Received from [facility]    Hunger Vital Sign     Within the past 12 months, you worried that your food would run out before you got the money to buy more.: Often true     Within the past 12 months, the food you bought just didn't last and you didn't have money to get more.: Sometimes true   Tobacco Use: High Risk (12/09/2024)    Patient History     Smoking Tobacco Use: Every Day     Smokeless Tobacco Use: Never     Passive Exposure: Not on file   Transportation Needs: No Transportation Needs (10/09/2023)    Received from [facility] - Transportation     In the past 12 months, has lack of transportation kept you from medical appointments or from getting medications?: No     Lack of Transportation (Non-Medical): No   Alcohol Use: Not At Risk (07/01/2024)    Received from [facility]    AUDIT-C     Q1: How often do you have a drink containing alcohol?: Never     Q2: How many drinks containing alcohol do you have on a typical day when you are drinking?: Patient does not drink     Frequency of Binge Drinking: Not on file   Housing: Unknown (10/09/2023)    Received from [facility]    Housing Stability Vital Sign     In the last 12 months, was there a time when you were not able to pay the mortgage or rent on time?: No     Number of Times Moved in the Last Year: Not on file     At any time in the past 12 months, were you homeless or living in a shelter (including now)?: No   Physical Activity: Not on file   Utilities: Not At Risk (10/09/2023)    Received from [facility]     Threatened with loss of utilities: No   Stress: Not on file   Interpersonal Safety:  Not At Risk (12/08/2024)    Interpersonal Safety     Unsafe Where You Currently Live: No     Physically Hurt by Anyone: No     Abused by Anyone: No   Substance Use: Not on file (10/06/2023)   Intimate Partner Violence: Not on file   Social Connections: Not on file   Financial Resource Strain: Low Risk  (10/09/2023)    Received from [facility]    Overall Financial Resource Strain (CARDIA)     Difficulty of Paying Living Expenses: Not very hard   Health Literacy: Not on file   Internet Connectivity: Not on file       Predictive Model Details          21% (Medium)  Factor Value    Calculated 12/09/2024 12:07 20% Number of active inpatient medication orders 31    [place] Risk of Unplanned Readmission Model 19% Diagnosis of drug abuse present     9% Active antipsychotic inpatient medication order present     9% ECG/EKG order present in last 6 months     9% Latest calcium  low (8.5 mg/dL)     7% Diagnosis of electrolyte disorder present     6% Imaging order present in last 6 months     5% Number of ED visits in last six months 1     4% Active anticoagulant inpatient medication order present     4% Latest creatinine high (1.32 mg/dL)     3% Charlson Comorbidity Index 3     2% Age 34     2% Future appointment scheduled     1% Active ulcer inpatient medication order present     0% Current length of stay 0.343 days

## 2024-12-09 NOTE — H&P (Signed)
 MICU History & Physical     Date of Service: 12/09/2024    Problem List:   Active Problems:    Seizures    (CMS-HCC)    Septic shock    (CMS-HCC)    Upper respiratory infection, viral    Infection due to human metapneumovirus (hMPV)      HPI: Austin Banks is a 34 y.o. 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    On arrival to the ED, patient was febrile to 38.C and hypotensive 90/59. Patient was given 2L IV fluids but remained hypotensive. Initial workup in ED notable for leukocytosis WBC 13, Na 133, Cr 2.13 (normal baseline), Mg 1.5. Lactate peaked at 3.7, improved with IF fluids. Infectious workup + metapneumovirus and CXR appears to be consistent with viral process, though pneumonia cannot be excluded.     Treated with 2L IV fluids, empiric vancomycin and zosyn. Magnesium  repleted. Patient remained hypotensive despite volume resuscitation, ultimately requiring initiation of pressors. Patient was transferred to the MICU due to concerns for septic shock 2/2 upper respiratory viral infection, requiring pressor support, iso immunosuppression (with Tocilizumab ).    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 No witnessed seizure activity at home. Home anti-epileptics of Depakote , Xcopri , Lamictal , Tocilizumab , Klonopin , and Nayzilam  for rescue. Patient reports frequently missing doses of antiepileptic medications. Reports multiple falls without head strike or LOC. CT head negative for acute intracranial process.  - Neurology consulted, appreciate recs. Low concern for seizures at this time. Patient has increased risk for breakthrough seizure with lowered seizure threshold iso infection, substance use, and missed medication doses.  - Follow-up Lamictal  and Valproate acid levels  - Continue home antiepileptic regimen    Analgesia: No pain issues  RASS at goal? N/A, not on sedation        Pulmonary   Viral upper respiratory infection, Metapneumovirus +  Presented with 2-3 days of general malaise, fevers, and productive cough. On arrival to ED patient had refractory hypotension requiring pressors (see below) and leukocytosis with WBC 13. CXR appears to be consistent with viral infection, however read is unable to exclude pneumonia.  - 3L [place], wean O2 as able    O2 Flow Rate (L/min):  [3 L/min] 3 L/min    Cardiovascular   Septic shock secondary to Metapneumovirus versus pneumonia  Presented with hypotension refractory to 2 L IV fluids.  Lactate peaked at 3.7, improved with fluids. Started on pressors.   - NE gtt, MAP goal >65  - See ID section for full infectious workup    Renal   AKI  Creatinine 2.13 at presentation (no history of prior renal dysfunction). Likely prerenal iso viral infection and multiple days of malaise and poor appetite. Given 2L IV fluids in the ED.  - Daily BMP    Infectious Disease/Autoimmune   Septic Shock  Metapneumo upper respiratory viral infection  ?Pneumonia  Presented with multiple days of weakness, fevers, cough, hypotension, and leukocytosis WBC 13. Hypotension refractory to 2L IV fluids, ultimately requiring initiation of pressors. Infectious workup started in the ED due to persist hypotension and history of immunocompromise (  takes Tocilizumab  for epilepsy). RPP positive for metapneumovirus. CXR with concerns for atelectasis vs aspiration, pneumonia cannot be excluded. Empirically treated with Vanc, Zosyn in ED.  - Follow-up 1/13 blood cultures  - UA pending  - Follow-up MRSA nares  - Continue Zosyn, Vanc for empiric pneumonia coverage  - Symptomatic treatment of viral URI  - Pressors for hypotension as above (See Cardio section)  - Consider infectious disease consult given severity of septic shock and history of immunosuppression with Tocilizumab     Cultures:  No results found for: BLOOD CULTURE, BLOOD CULTURE, ROUTINE, URINE CULTURE, COMPREHENSIVE, URINE CULTURE, COMPREHENSIVE, LOWER RESPIRATORY CULTURE  WBC (10*9/L)   Date Value   12/08/2024 13.0 (H)           Sepsis Protocol Documentation      FEN/GI   NAI     Provider Malnutrition Assessment:  Body mass index is 19.92 kg/m??.  BMI Interpretation: within normal limits.  GLIM criteria:   Pt does not meet criteria  -I have screened this patient for malnutrition and they did NOT meet criteria for malnutrition based on GLIM criteria.  -Nutrition consulted no  RD assessment:Not done yet.           Heme/Coag   NAI    Endocrine   NAI    Integumentary   #  - WOCN consulted for high risk skin assessment No. Reason: NA.  - WOCN recs >> n/a  - cont pressure mitigating precautions per skin policy    Prophylaxis/LDA/Restraints/Consults   ICU Checklist completed: see ICU rounding navigator        Patient Lines/Drains/Airways Status       Active Active Lines, Drains, & Airways       Name Placement date Placement time Site Days    Peripheral IV 12/08/24 Right Antecubital 12/08/24  1955  Antecubital  less than 1    Peripheral IV 12/08/24 Anterior;Left Forearm 12/08/24  2154  Forearm  less than 1                  Patient Lines/Drains/Airways Status       Active Wounds       None                    Goals of Care     Code Status: Full Code    Designated Healthcare Decision Maker: His designated healthcare decision maker(s) is/are HCDM (patient stated preference): [place], [place] - Father - [phone].     See HCDM section of Epic sidebar/storyboard or ACP tab in patient chart for details regarding active HCDMs and patient capacity for decision-making.      Subjective     HPI:  Austin Banks is a 34 y.o. 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    Neurology consulted due to concern for seizures and history of autoimmune epilepsy. Per neurology, low concerns for seizure at this time, however patient is at risk for breakthrough seizure given lowered seizure threshold iso likely infection, substance use, and missed medication doses.    On my interview, patient states he came to the hospital due to balance issues and had a fall, denies head strike and LOC. No symptoms prior to the fall, states he felt unbalanced and fell. Also reports general weakness, minimal appetite, nausea, and  productive cough for the prior 2 days. No sick contacts. Denies numbness or tingling of hands/feet.    Patient is A&Ox3 and falls asleep during the interview but easily wakes up. He is unable to explain to me why he is in the hospital or admitted to the ICU. He is unable to provide me with his past medical history or medications. He states he does not miss medications doses. He reports current tobacco, marijuana, and cocaine use. Denies alcohol use.    ROS: Ten-point review of systems is obtained and is negative except as noted in HPI.    Allergies  Allergies[1]    Meds  Meds ordered prior to current encounter[2]    Past Medical History  Past Medical History[3]    Past Surgical History  Past Surgical History[4]    Family History  Family History[5]    Social History  Social History [6]        Objective     Vitals - past 24 hours  Temp:  [37.2 ??C (98.9 ??F)-38.1 ??C (100.6 ??F)] 37.2 ??C (98.9 ??F)  Pulse:  [65-100] 69  SpO2 Pulse:  [65-89] 68  Resp:  [13-30] 23  BP: (79-100)/(49-77) 96/68  SpO2:  [91 %-100 %] 97 % Intake/Output  No intake/output data recorded.     Physical Exam:    General: Lying in bed, no acute distress. Falls asleep during the interview but easily awakens to voice.  HEENT: Normocephalic, atraumatic. Moist mucous membranes.  CV: Regular rate and rhythm. No murmurs auscultated.  Pulm: Diffuse coarse breath sounds throughout lung fields.  GI: Non-distended, non-tender to palpation.  Skin: No rashes or wounds appreciated on clothed exam.  Neuro: A&Ox3. Moves all extremities spontaneously.    Significant Comorbid Conditions   The following clinically significant conditions are present, requiring additional evaluation/treatment, and contribute to the patient's acuity and expected duration of hospitalization: - Probable Septic shock due to findings of Presence of presumed infection requiring further investigation, treatment, or monitoring -- Present on admission  - Immunocompromised state POA requiring further investigation, treatment, or monitoring     Body mass index is 19.92 kg/m??.            Wt Readings from Last 12 Encounters:   12/08/24 68.5 kg (151 lb)   08/27/24 68.8 kg (151 lb 9.6 oz)   07/10/24 68 kg (150 lb)   02/11/24 74.4 kg (164 lb)   02/02/24 70.8 kg (156 lb 1.4 oz)   11/26/23 71.7 kg (158 lb 1.6 oz)   11/01/23 72.1 kg (159 lb)   09/06/23 66.5 kg (146 lb 9.6 oz)   04/16/23 80.5 kg (177 lb 6.4 oz)   04/11/23 81.6 kg (180 lb)   02/21/23 77.6 kg (171 lb)   01/24/23 79.4 kg (175 lb)       Continuous Infusions:   Infusions Meds[7]    Scheduled Medications:   Scheduled Medications[8]    PRN medications:  PRN Medications[9]    Data/Imaging Review: Reviewed in Epic and personally interpreted on 12/09/2024. See EMR for detailed results.    Reagann Dolce, MD  Internal Medicine PGY1           [1]   Allergies  Allergen Reactions    Lacosamide  Other (See Comments)     Psychosis    Hallucinations   [2]   No current facility-administered medications on file prior to encounter.     Current Outpatient Medications on File Prior to Encounter   Medication Sig    cetirizine  (  ZYRTEC ) 10 MG tablet Take 1 tablet (10 mg total) by mouth every morning.    clonazePAM  (KLONOPIN ) 1 MG tablet TAKE 1 TABLET BY MOUTH IN THE MORNING and TAKE 2 TABLETS AT BEDTIME (PER patient) may take additional tablets after a big seizure or 2 small one. no more than 4 tablets per day    divalproex  ER (DEPAKOTE  ER) 500 MG extended released 24 hr tablet TAKE 3 TABLETS BY MOUTH NIGHTLY    empty container Misc Use as directed to dispose of injections    folic acid  (FOLVITE ) 1 MG tablet Take 1 tablet (1 mg total) by mouth daily.    haloperidol  (HALDOL ) 5 MG tablet Take 3 tablets (15 mg total) by mouth nightly.    lamoTRIgine  (LAMICTAL ) 200 MG tablet TAKE 1 TABLET BY MOUTH IN THE MORNING and TAKE 1 TABLET AT BEDTIME    levocarnitine  500 mg Tab Take 1 tablet (500 mg) by mouth Two (2) times a day.    midazolam  (NAYZILAM ) 5 mg/spray (0.1 mL) Spry 1 spray (5 mg) into 1 nostril for a seizure 3 minutes long; May give 2nd dose after 10 minutes for recurrent seizures. do not give a second dose if the patient has trouble breathing, MAX- 2 sprays in 72hrs and 4 sprays a week.    multivit-min-iron  fum-folic ac 7.5 mg iron -400 mcg Tab Take 1 tablet by mouth daily.    naloxone  (NARCAN ) 4 mg nasal spray Give one nasal spray for mental status change due to excessive illicit drug use and call 911    tocilizumab  (ACTEMRA  ACTPEN) 162 mg/0.9 mL PnIj subcutaneous injection Inject the contents of 1 pen (162 mg) under the skin every seven (7) days.    XCOPRI  200 mg tablet TAKE 2 TABLETS BY MOUTH AT BEDTIME per patient    [DISCONTINUED] lacosamide  (VIMPAT ) 100 mg Tab Take 1 tablet (100 mg total) by mouth Two (2) times a day.   [3]   Past Medical History:  Diagnosis Date    Fall     from medications    Hepatitis B surface antigen positive     Schizophrenia (CMS-HCC)     Seizure    (CMS-HCC)     TBI (traumatic brain injury) (CMS-HCC)     Vitamin D deficiency    [4]   Past Surgical History:  Procedure Laterality Date    MANDIBLE FRACTURE SURGERY     [5]   Family History  Problem Relation Age of Onset    Hypertension Mother     Hypertension Father     Diabetes Father     Cancer Maternal Aunt     Cancer Maternal Grandmother     Cancer Paternal Grandfather     Seizures Neg Hx    [6]   Social History  Socioeconomic History    Marital status: Single   Tobacco Use    Smoking status: Every Day     Current packs/day: 1.00     Average packs/day: 1 pack/day for 10.0 years (10.0 ttl pk-yrs)     Types: Cigarettes    Smokeless tobacco: Never    Tobacco comments:     10-20cpd   Vaping Use    Vaping status: Never Used   Substance and Sexual Activity    Alcohol use: Not Currently     Comment: rare    Drug use: Yes     Frequency: 7.0 times per week     Types: Marijuana, Cocaine   Social History Narrative    Updated  May 2023        PSYCHIATRIC HX:     -Prior Dx: Schizophrenia. Seizures started ~age 51 around same time of head injury (unknown mechanism), psychotic symptoms ~age 15 by chart review. Seizures intractible for many years, in 2021 was found to be GAD65+ in serum and CSF, has been receiving IVIG + cellcept  or rituximab since then. Historically in post-ictal state has had more psychotic symptoms/agitation.     -Hospitalizations: 10+ prior including at [facility]; most recent at [facility] in 2021, involuntarily on 3NSH due to psychosis. Last known in Nov 2022 at [facility] for delusions/AH    -Suicide attempts: denies     -Non-suicidal self-injury: denies      -Medication Trials: Zyprexa (stopped due to fear of increased seizure risk, was on doses as high as 40mg ), Risperdal  (and Zyprexa and risperidone  together at times), Geodon, abilify, Lamictal , Invega Sustenna x 2 months and stopped at [facility]. In spring 2023 admitted to Neuro on risperidone  3mg  BID, while hospitalized changed to haldol  5mg  BID (May 2023)    -Med compliance Hx: fair     -Current treatment: ACT team through [facility]    -Past Psych: [facility], [facility] in [place]. [facility]- many group homes and different ACT/CST in past        SUBSTANCE ABUSE HX:    # Marijuana: several times a day since age 74, reported use as recently as 2022    # Cocaine: prior use, occasionally. Last reported use ~2012    # Alcohol: Patient denies recent use. No known hx of Sz/DT/Complicated WD Hx, in 2016 drinking 2-3 drinks a day         Social Hx:    -Current Living Environment: Currently living with parents; previously at group homes; [facility] in [place]    -Guardian: Father; Taevion Sikora (home [phone]), cell [phone])    -Sexual orientation: openly homosexual to family from 2016, also has a child     -Education/School Performance: High school, regular classes     -Income/Employment: disability check; -dad is his payee    -Legal/School Discipline: breaking and entering, assault charges     -Abuse/Neglect/Trauma/DV: denies     -Violence (perp): previous assault charges     -Weapons Access: denies guns        FAMILY HX    -Mental Illness: denies     -Suicide: denies     -Substance Abuse: extensive history alcohol and recreational drug use     Social Drivers of Health     Food Insecurity: Food Insecurity Present (10/09/2023)    Received from [facility]    Hunger Vital Sign     Within the past 12 months, you worried that your food would run out before you got the money to buy more.: Often true     Within the past 12 months, the food you bought just didn't last and you didn't have money to get more.: Sometimes true   Tobacco Use: High Risk (12/08/2024)    Patient History     Smoking Tobacco Use: Every Day     Smokeless Tobacco Use: Never   Transportation Needs: No Transportation Needs (10/09/2023)    Received from [facility] - Transportation     In the past 12 months, has lack of transportation kept you from medical appointments or from getting medications?: No     Lack of Transportation (Non-Medical): No  Alcohol Use: Not At Risk (07/01/2024)    Received from [facility]    AUDIT-C     Q1: How often do you have a drink containing alcohol?: Never     Q2: How many drinks containing alcohol do you have on a typical day when you are drinking?: Patient does not drink   Housing: Unknown (10/09/2023)    Received from [facility]    Housing Stability Vital Sign     In the last 12 months, was there a time when you were not able to pay the mortgage or rent on time?: No     At any time in the past 12 months, were you homeless or living in a shelter (including now)?: No   Utilities: Not At Risk (10/09/2023)    Received from [facility] Utilities     Threatened with loss of utilities: No   Interpersonal Safety: Not At Risk (12/08/2024)    Interpersonal Safety     Unsafe Where You Currently Live: No     Physically Hurt by Anyone: No     Abused by Anyone: No   Financial Resource Strain: Low Risk  (10/09/2023)    Received from [facility]    Overall Financial Resource Strain (CARDIA)     Difficulty of Paying Living Expenses: Not very hard   [7]    NORepinephrine bitartrate-NS 4 mcg/min (12/09/24 0317)   [8]    cenobamate   200 mg Oral Daily    cetirizine   10 mg Oral Q AM    clonazePAM   1 mg Oral Daily    clonazePAM   2 mg Oral Nightly    divalproex  ER  1,500 mg Oral Nightly    enoxaparin  (LOVENOX ) injection  40 mg Subcutaneous Q24H    folic acid   1 mg Oral Daily    haloperidol   15 mg Oral Nightly    lamoTRIgine   200 mg Oral BID    levocarnitine   500 mg Oral BID    multivitamins (ADULT)  1 tablet Oral Daily   [9] acetaminophen , aluminum-magnesium  hydroxide-simethicone, guaiFENesin, melatonin, senna

## 2024-12-09 NOTE — Consults (Signed)
 OCCUPATIONAL THERAPY  Evaluation (12/09/24 0810)    Patient Name:  Austin Banks       Medical Record Number: [id number]     Date of Birth: 02-Dec-1990  Sex: Male      Post-Discharge Occupational Therapy Recommendations: 5x weekly, Low intensity   Equipment Recommendation  OT DME Recommendations: Defer to post acute       OT Treatment Diagnosis: Generalized muscle weakness, Cognitive changes due to medical disorder, Limitation of activities due to disability, Need for assistance with personal care, Unsteadiness on feet, Reduced mobility      Assessment  Assessment: Austin Banks is a 34 y.o. 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 On arrival to the ED, patient was febrile to 38.C and hypotensive 90/59. Patient was given 2L IV fluids but remained hypotensive. Initial workup in ED notable for leukocytosis WBC 13, Na 133, Cr 2.13 (normal baseline), Mg 1.5. Lactate peaked at 3.7, improved with IF fluids. Infectious workup + metapneumovirus and CXR appears to be consistent with viral process, though pneumonia cannot be excluded. Treated with 2L IV fluids, empiric vancomycin and zosyn. Magnesium  repleted. Patient remained hypotensive despite volume resuscitation, ultimately requiring initiation of pressors. Patient was transferred to the MICU due to concerns for septic shock 2/2 upper respiratory viral infection, requiring pressor support, iso immunosuppression (with Tocilizumab ).     Pt oriented x4, however with cognitive deficits in processing, attention, reasoning and safety. Today requiring significant cueing to complete simple grooming task (oral care). Able to mobilize around hospital room with (Min-A) - pt with poor balance, drowsy and keeping eyes closed when mobilizing. Currenty requiring assist for all ADLs and IADLs. Will benefit from OT services during acute stay and post-acute at 5xlow intensity to maximize safety and funcitonal independence. After review of the patient's occupational profile and history, assessment of occupational performance, clinical decision making, and development of POC, the patient presents as a High complexity case.    Problem List: Impaired judgement, Decreased safety awareness, Impaired balance, Fall risk, Impaired ADLs, Decreased activity tolerance, Decreased cognition, Decreased coordination, Decreased mobility, Decreased strength    Clinical Decision Making: High Complexity      Today's Interventions:   EDUCATION: role of OT, OT POC, hospital safety, fall preventin                   INTERVENTIONS:  -occupational profile  -vitals monitored throughout session to guide activity   -assessment of cognition, strength, ROM and balance  -assessment of mobility (bed mobility, balance, transfers, mobility in hospital room)  -ADL tasks (oral care, donned socks)    Activity Tolerance During Today's Session  Tolerated treatment well    Plan  Planned Frequency of Treatment: Plan of Care Initiated: 12/09/24  1-2x per day Weekly Frequency: 3-4 days per week  Planned Treatment Duration: 12/23/24    Planned Interventions:  Cognitive Skills Development, Self-Care/Home Training, Therapeutic Exercise, Therapeutic Activity, Education (Patient/Family/Caregiver), Home Exercise Program      GOALS:   Patient and Family Goals: None stated on evalluation    Short Term:   SHORT GOAL #1: Will complete full body dressing + item retreival with Mod-I   Time Frame : 2 weeks  SHORT GOAL #2: Will complete 4 minutes  of standing ADLs with Mod-I   Time Frame : 2 weeks  SHORT GOAL #3: Will complete toilet transfer + toileting tasks with Mod-I   Time Frame : 2 weeks    Long Term Goal #1: Will score 24/24 on AMPAC in 6 weeks    Prognosis:  Poor  Positive Indicators:  agreeable to OT  Barriers to Discharge: Cognitive deficits, Decreased caregiver support, Decreased safety awareness, Functional strength deficits, Endurance deficits, Inability to safely perform ADLS, Impaired Balance, Severity of deficits, Gait instability    Subjective  Medical Updates Since Last Visit/Relevant PMH Affecting Clinical Decision Making:    Prior Functional Status PTA pt independent for ADLs and mobility. Does not drive.    Living Situation  Living environment: House  Lives With: Alone  Home Living: One level home, Stairs to enter without rails, Tub/shower unit, Standard height toilet, Grab bars in shower, Grab bars around toilet  Number of Stairs to Enter (outside): 3     Equipment available at home:  (shower chair)    Medical Tests / Procedures: Reviewed in EPIC       Patient / Caregiver reports: 10...11...9...13...12...11 pt said when asked to count backwards.      Past Medical History[1] Social History     Tobacco Use    Smoking status: Every Day     Current packs/day: 1.00     Average packs/day: 1 pack/day for 10.0 years (10.0 ttl pk-yrs)     Types: Cigarettes    Smokeless tobacco: Never    Tobacco comments:     10-20cpd   Substance Use Topics    Alcohol use: Not Currently     Comment: rare      Past Surgical History[2] Family History[3]     Lacosamide      Objective Findings  Precautions / Restrictions  Falls precautions, Isolation precautions, Seizure precautions (contact/droplet)    Weight Bearing   None  Required Braces or Orthoses  Non-applicable  Communication Preference  Verbal       Pain  Denied pain. Endorsing tingling sensation in his nose. Activities graded per tolerance. RN aware.    Equipment / Environment  Vascular access (PIV, TLC, Port-a-cath, PICC), Telemetry  Cognition   Orientation Level:   (oriented to self, place, situation, year)   Arousal/Alertness:  Delayed responses to stimuli   Attention Span:  Difficulty attending to directions, Difficulty dividing attention   Memory:   (increased time for recall; decreashed short-term memory)   Following Commands:  Requires increased time, Requires repetition   Safety Judgment:  Decreased awareness of need for safety   Awareness of Errors and Problem Solving:  Assistance required to identify errors made, Assistance required to generate solutions, Assistance required to implement solutions   Comments: Unable to accurately count backwards from 20 (skipping numbers, jumping around between numbers); pausing/freezing while participating in ADL tasks (oral care); requiring cueing for task initation    Vision / Hearing   Vision Comments: pt reports double vision at baseline (reports he does not normally wear glasses due to previous trauma while wearing glasses)  Hearing: No deficit identified     Hand Function:  Right Hand Function: Right hand function impaired  Right Hand Impairment: grip strength fair, coordination impaired  Left Hand Function: Left hand function impaired  Left Hand Impairment: grip strength fair, coordination impaired    Skin Inspection:  Skin Inspection: Intact where visualized    ROM / Strength:  UE ROM/Strength: Left WFL, Right WFL  LE ROM/  Strength Comment: deconditioned but WFL    Sensation:  Sensory/ Proprioception/ Stereognosis comments: reports tingling in nose    Balance:  Static Sitting-Level of Assistance: Contact guard  Dynamic Sitting-Level of Assistance: Contact guard  Static Standing-Level of Assistance: Minimum assistance (posterior lean)  Dynamic Standing - Level of Assistance: Minimum assistance    Functional Mobility  Transfers: Contact Guard assist (for sit > stand from bed)  Bed Mobility : Standby assist (for supine > sit and sit > supine)    Ambulation  Level of Assistance: Minimal assist, patient does 75% or more (Min-A for mobility in hospital room with hand-held assist)  Assistive Device:  (B hand-held assist)    ADLs  Feeding : Min assist, Performed seated  Grooming: Min assist, Performed seated (completed oral care sitting EOB; requiring significant cueing to initiate task; pt appears to freeze while dipping toothbrush in water holding toothbrush in air at angle and pausing for ~15seconds)  Toileting:  (Recommend staff assist for up to bathroom for safety; able to void into urinal after set-up from bed-level)  LB Dressing: Performed seated, Min assist (Able to don socks seated EOB - requiring up to Min-A to maintain balance)    IADLs: Not tested - based on cognitive status, would require assist for all IADLs    Vitals / Orthostatics  Vitals/Orthostatics: Sitting BP  122/79;  in standing  BP 109/71;   in supine BP 107/87   pt's HR in 50s-70s throughout + with one episode of brady to the 40s wtih mobility    Patient at end of session: All needs in reach, In bed, Lines intact, Notified Nurse     Occupational Therapy Session Duration  OT Individual [mins]: 13  OT Co-Treatment [mins]: 30 (with PT C Sheppard)  Direct Care Non-Bill [mins]: 15  Reason for Co-treatment: To safely progress mobility       AM-PAC-Daily Activity  Lower Body Dressing assistance needs: A Little - Minimal/Contact Guard Assist/Supervision  Bathing assistance needs: A Little - Minimal/Contact Guard Assist/Supervision  Toileting assistance needs: A Little - Minimal/Contact Guard Assist/Supervision  Upper Body Dressing assistance needs: A Little - Minimal/Contact Guard Assist/Supervision  Personal Grooming assistance needs: A Little - Minimal/Contact Guard Assist/Supervision  Eating Meals assistance needs: A Little - Minimal/Contact Guard Assist/Supervision    Daily Activity Score:  Daily Activity Score: 18    Score (in points): % of Functional Impairment, Limitation, Restriction  6: 100% impaired, limited, restricted  7-8: At least 80%, but less than 100% impaired, limited restricted  9-13: At least 60%, but less than 80% impaired, limited restricted  14-19: At least 40%, but less than 60% impaired, limited restricted  20-22: At least 20%, but less than 40% impaired, limited restricted  23: At least 1%, but less than 20% impaired, limited restricted  24: 0% impaired, limited restricted    I attest that I have reviewed the above information.  Signed: Lauraine DELENA Hutch, OT  Filed 12/09/2024                 [1]   Past Medical History:  Diagnosis Date    Fall     from medications    Hepatitis B surface antigen positive     Schizophrenia (CMS-HCC)     Seizure    (CMS-HCC)     TBI (traumatic brain injury) (CMS-HCC)     Vitamin D deficiency    [2]   Past Surgical History:  Procedure Laterality Date    MANDIBLE FRACTURE SURGERY     [  3]   Family History  Problem Relation Age of Onset    Hypertension Mother     Hypertension Father     Diabetes Father     Cancer Maternal Aunt     Cancer Maternal Grandmother     Cancer Paternal Grandfather     Seizures Neg Hx

## 2024-12-10 LAB — BASIC METABOLIC PANEL
ANION GAP: 12 mmol/L (ref 5–14)
BLOOD UREA NITROGEN: 10 mg/dL (ref 9–23)
BUN / CREAT RATIO: 10
CALCIUM: 8.4 mg/dL — ABNORMAL LOW (ref 8.7–10.4)
CHLORIDE: 107 mmol/L (ref 98–107)
CO2: 27.9 mmol/L (ref 20.0–31.0)
CREATININE: 0.99 mg/dL (ref 0.73–1.18)
EGFR CKD-EPI (2021) MALE: >90 mL/min/1.73m2 (ref >=60)
GLUCOSE RANDOM: 98 mg/dL (ref 70–179)
POTASSIUM: 4.1 mmol/L (ref 3.4–4.8)
SODIUM: 147 mmol/L — ABNORMAL HIGH (ref 135–145)

## 2024-12-10 LAB — CBC W/ AUTO DIFF
BASOPHILS ABSOLUTE COUNT: 0 10*9/L (ref 0.0–0.1)
BASOPHILS RELATIVE PERCENT: 0.5 %
EOSINOPHILS ABSOLUTE COUNT: 0 10*9/L (ref 0.0–0.5)
EOSINOPHILS RELATIVE PERCENT: 0.1 %
HEMATOCRIT: 42.1 % (ref 39.0–48.0)
HEMOGLOBIN: 14.4 g/dL (ref 12.9–16.5)
LYMPHOCYTES ABSOLUTE COUNT: 2.5 10*9/L (ref 1.1–3.6)
LYMPHOCYTES RELATIVE PERCENT: 29.1 %
MEAN CORPUSCULAR HEMOGLOBIN CONC: 34.1 g/dL (ref 32.0–36.0)
MEAN CORPUSCULAR HEMOGLOBIN: 32.1 pg (ref 25.9–32.4)
MEAN CORPUSCULAR VOLUME: 94.3 fL (ref 77.6–95.7)
MEAN PLATELET VOLUME: 9.1 fL (ref 6.8–10.7)
MONOCYTES ABSOLUTE COUNT: 0.8 10*9/L (ref 0.3–0.8)
MONOCYTES RELATIVE PERCENT: 9.4 %
NEUTROPHILS ABSOLUTE COUNT: 5.1 10*9/L (ref 1.8–7.8)
NEUTROPHILS RELATIVE PERCENT: 60.9 %
PLATELET COUNT: 67 10*9/L — ABNORMAL LOW (ref 150–450)
RED BLOOD CELL COUNT: 4.47 10*12/L (ref 4.26–5.60)
RED CELL DISTRIBUTION WIDTH: 13 % (ref 12.2–15.2)
WBC ADJUSTED: 8.5 10*9/L (ref 3.6–11.2)

## 2024-12-10 LAB — BLOOD GAS, VENOUS
BASE EXCESS VENOUS: 6.1 — ABNORMAL HIGH (ref -2.0–2.0)
CARBOXYHEMOGLOBIN, VENOUS: 1.1 % (ref <1.2)
HCO3 VENOUS: 31 mmol/L — ABNORMAL HIGH (ref 22–27)
METHEMOGLOBIN, VENOUS: <1 % (ref <1.5)
O2 SATURATION VENOUS: 75.5 % (ref 40.0–85.0)
OXYHEMOGLOBIN, VENOUS: 74.2 % (ref 40.0–85.0)
PCO2 VENOUS: 49 mmHg (ref 40–60)
PH VENOUS: 7.41 (ref 7.32–7.43)
PO2 VENOUS: 43 mmHg (ref 30–55)

## 2024-12-10 LAB — BLOOD GAS, ARTERIAL
BASE EXCESS ARTERIAL: 3.2 — ABNORMAL HIGH (ref -2.0–2.0)
CARBOXYHEMOGLOBIN: 1.1 % (ref <1.2)
HCO3 ARTERIAL: 27 mmol/L (ref 22–27)
METHEMOGLOBIN: <1 % (ref <1.5)
O2 SATURATION ARTERIAL: 96.1 % (ref 94.0–100.0)
OXYHEMOGLOBIN: 94.2 % (ref 94.0–100.0)
PCO2 ARTERIAL: 41.6 mmHg (ref 35.0–45.0)
PH ARTERIAL: 7.43 (ref 7.35–7.45)
PO2 ARTERIAL: 79.3 mmHg — ABNORMAL LOW (ref 80.0–110.0)

## 2024-12-10 LAB — CK: CREATINE KINASE TOTAL: 161 U/L (ref 46.0–171.0)

## 2024-12-10 LAB — VALPROIC ACID LEVEL, TOTAL: VALPROIC ACID TOTAL: 72.3 ug/mL (ref 50.0–100.0)

## 2024-12-10 LAB — T4, FREE: FREE T4: 1.12 ng/dL (ref 0.89–1.76)

## 2024-12-10 LAB — LACTATE, VENOUS, WHOLE BLOOD: LACTATE BLOOD VENOUS: 2.2 mmol/L — ABNORMAL HIGH (ref 0.5–1.8)

## 2024-12-10 LAB — T4: T4 TOTAL: 4.2 ug/dL — ABNORMAL LOW (ref 4.50–10.90)

## 2024-12-10 LAB — SYPHILIS SCREEN: SYPHILIS RPR SCREEN: NONREACTIVE

## 2024-12-10 MED ADMIN — levocarnitine (CARNITOR) (SF) oral solution: 500 mg | ORAL | @ 14:00:00

## 2024-12-10 MED ADMIN — levocarnitine (CARNITOR) (SF) oral solution: 500 mg | ORAL | @ 03:00:00

## 2024-12-10 MED ADMIN — clonazePAM (KlonoPIN) tablet 1 mg: 1 mg | ORAL | @ 14:00:00

## 2024-12-10 MED ADMIN — cetirizine (ZYRTEC) tablet 10 mg: 10 mg | ORAL | @ 14:00:00

## 2024-12-10 MED ADMIN — enoxaparin (LOVENOX) syringe 40 mg: 40 mg | SUBCUTANEOUS | @ 14:00:00

## 2024-12-10 MED ADMIN — folic acid (FOLVITE) tablet 1 mg: 1 mg | ORAL | @ 14:00:00

## 2024-12-10 MED ADMIN — lamoTRIgine (LaMICtal) tablet 200 mg: 200 mg | ORAL | @ 03:00:00

## 2024-12-10 MED ADMIN — lamoTRIgine (LaMICtal) tablet 200 mg: 200 mg | ORAL | @ 14:00:00

## 2024-12-10 MED ADMIN — famotidine (PF) (PEPCID) injection 20 mg: 20 mg | INTRAVENOUS | @ 14:00:00

## 2024-12-10 MED ADMIN — famotidine (PF) (PEPCID) injection 20 mg: 20 mg | INTRAVENOUS | @ 03:00:00

## 2024-12-10 MED ADMIN — clonazePAM (KlonoPIN) tablet 2 mg: 2 mg | ORAL | @ 03:00:00

## 2024-12-10 MED ADMIN — multivitamins, therapeutic with minerals tablet 1 tablet: 1 | ORAL | @ 14:00:00

## 2024-12-10 MED ADMIN — piperacillin-tazobactam (ZOSYN) IVPB 4.5 g in D5W 100 mL IVPB (premade): 4.5 g | INTRAVENOUS | @ 11:00:00 | Stop: 2024-12-15

## 2024-12-10 MED ADMIN — piperacillin-tazobactam (ZOSYN) IVPB 4.5 g in D5W 100 mL IVPB (premade): 4.5 g | INTRAVENOUS | @ 16:00:00 | Stop: 2024-12-15

## 2024-12-10 MED ADMIN — piperacillin-tazobactam (ZOSYN) IVPB 4.5 g in D5W 100 mL IVPB (premade): 4.5 g | INTRAVENOUS | @ 22:00:00 | Stop: 2024-12-15

## 2024-12-10 MED ADMIN — piperacillin-tazobactam (ZOSYN) IVPB 4.5 g in D5W 100 mL IVPB (premade): 4.5 g | INTRAVENOUS | @ 06:00:00 | Stop: 2024-12-15

## 2024-12-10 MED ADMIN — cenobamate (XCOPRI) tablet 200 mg: 200 mg | ORAL | @ 14:00:00

## 2024-12-10 MED ADMIN — thiamine (B-1) injection 200 mg: 200 mg | INTRAVENOUS | @ 17:00:00 | Stop: 2024-12-15

## 2024-12-10 MED ADMIN — divalproex ER (DEPAKOTE ER) extended released 24 hr tablet 1,500 mg: 1500 mg | ORAL | @ 03:00:00

## 2024-12-10 MED ADMIN — lactated ringers bolus 1,000 mL: 1000 mL | INTRAVENOUS | @ 03:00:00 | Stop: 2024-12-09

## 2024-12-10 MED ADMIN — haloperidol (HALDOL) tablet 15 mg: 15 mg | ORAL | @ 03:00:00 | NDC 72603023001

## 2024-12-10 MED ADMIN — lactated ringers bolus 1,000 mL: 1000 mL | INTRAVENOUS | @ 21:00:00 | Stop: 2024-12-10

## 2024-12-10 NOTE — Procedures (Signed)
 Arterial Line Insertion Procedure Note     Date of Service: 12/10/2024    Patient Name:: Austin Banks  Patient MRN: [id number]    Indications: Hemodynamic monitoring    Consent:   I explained the potential benefits and risks of the procedure, including  temporary vascular occlusion, thrombosis, ischemia, hematoma formation, local/catheter-related infection and nerve/tissue damage. I explained potential alternatives. The patient/HCDM understands these risks, agrees to the procedure, and signed the informed consent form.    Procedure Details:   Time-out was performed immediately prior to the procedure to verify correct patient, procedure, site, positioning, and special equipment if applicable.    Dasie???s test was performed to ensure adequate perfusion. The patient???s left wrist was prepped and draped in sterile fashion. 1% Lidocaine  was used to anesthetize the area. A 20 G Arrow line was introduced into the radial artery with ultrasound guidance. The catheter was threaded over the guide wire and the needle was removed with appropriate pulsatile blood return. The catheter was then sutured in place to the skin and a sterile CHG dressing applied. Perfusion to the extremity distal to the point of catheter insertion was checked and found to be adequate.  A pressure transducer was connected sterilely to the arterial line and an arterial line waveform was noted on the monitor.     Estimated Blood Loss: 5 ml    Condition:  The patient tolerated the procedure well and remains in the same condition as pre-procedure.    Complications:  The patient tolerated the procedure well and there were no complications.    Plan:  Continuous BP monitoring    Artist ONEIDA Ill, MD

## 2024-12-10 NOTE — Consults (Signed)
 Follow-up Inpatient Consult Note      Requesting Attending Physician:  Rosina Trenton Sauers, MD  Service Requesting Consult: Medical ICU (MDI)     Assessment and Plan       Austin Banks is a 34 y.o. male on whom I have been asked by Ashley Glyn Henderson, MD to consult for AMS given seizure history.    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 Discussed and confirmed with outpatient provider that at baseline he has 1-3 non-convulsive events in a week and 1 witnessed GTC 1-3 months (he lives alone so frequency unclear) Currently managed at home on Depakote , Xcopri , Lamictal , Klonopin  and has Nayzilam  for rescue. Every time klonopin  was decreased he had increased frequency of seizures (please see outpatient neurology note from 06/2024 for full details).     Came to hospital on 1/13 for 2 days h/o fevers, emesis, poor p.o. intake, malaise, and generalized weakness concerning for infection.  Patient also endorses ongoing substance use (methamphetamine). UDS positive for Meth and Cocaine. No reported breakthrough seizures, though patient does report frequent missed medication doses of his antiseizure medications and he could have been post-ictal during current hospitalization.     Exam reassuring and either at baseline or very close to it, including lethargy and dysarthria which is what he is usually like. EEG without seizures, captured event of more decreased responsiveness when he required sternal rub/nox stimulation to respond which did not have any EEG correlate. Depakote  level therapeutic.     Based on all discussed above would recommend to continue his home medications without any changes, would tolerate any somnolence cause by ASMs given benefits out weight the risks. Regarding hypotension and bradycardia, per chart review has prior episodes of bradycardia documented and RBBB. Would defer this to primary team. We will sign off. Recommendations:  -Follow-up on drug levels   -Continue home antiepileptic regimen as able  If unable to tolerate PO can transition depakote  to IV with assistance of pharmacy however other medications (Xcopri , lamictal ) do not have IV formulation thus in that case would consider NGT for enteral access  -DC EEG  -Continued workup for infection and hypotension/bradycardia per primary team.    Patient was seen and discussed with Dr. Christiane who agrees with above assessment and plan. Findings, interpretations, and recommendations discussed with patient and primary team.We will sign off at this time. Please page neurology pager in case of further questions.    Rennis Peed, MD  PGY-4 Neurology     HPI      Reason for Consult: History of epilepsy    Austin Banks is a 34 y.o.  male on whom I have been asked by Rosina Trenton Sauers, MD to consult for history of epilepsy.    IE 1/15:  - Had similar episode of decreased responsiveness, requiring sternal rubbing however quickly got alert and started complaining of having pain at the area of sternal rub and asked examiners not to nox him anymore     IE 1/14:  -Has metapneumovirus   -Hypotensive at night high 70s, 88s, on levophed 8mcg, [place] 2-3L  -VPA 2211 101.2 (H) but this is unlikely to be a true trough level, never got medicine in ED, unclear if he took It at home    -Episode of unresponsiveness later in the morning, patient reportedly very encephalopathic with low BP, bradycardia and not responding which self resolved in <10 minutes  Initial HPI:  History of epilepsy since age 53 around the time of a TBI though differential of autoimmune epilepsy given CSF/serum gad 65.  He has well-controlled at home on Depakote , Xcopri , Lamictal , Klonopin .    Presents today for 2 days of increased cough, nausea, vomiting, malaise with generalized weakness.  Patient states he tried ice (methamphetamine via inhalation) a few days ago and blames his current symptoms on this. Father at bedside reports no witnessed seizures and patient thinks he may have had a seizure in the past week though he does not remember and per chart review he is amnestic to his events. He says he is generally good about taking his medications but will miss a dose here and there, maybe 1-2 times weekly but denies back-to-back missed doses leading up to current admission.    Per family at bedside his current mental state is not in line with his typical ictal or postictal states.    Known semiologies:  1) blinking, left gaze, left facial twitching, LUE flexion, LLE tonic-clonic to generalized. Post-ictal confusion up to 20 minutes  2) left gaze, behavioral arrest with post-ictal confusional state often with attempt to remove clothes lasting about 10 minutes  -No prior EEG correlate to event of cold sensation, feeling like a seizure is coming on, delayed responsiveness with intact responses and commands    Per review of outpatient and prior inpatient notes, no known history of electrographic only seizures  Allergies[1]  Current Medications[2]  Past Medical History[3]  Past Surgical History[4]  Social History[5]  Family History[6]  Code Status: Full Code     Review of Systems   A 10-system review of systems was conducted and was negative except as documented above in the HPI.      Objective    Temp:  [36.6 ??C (97.9 ??F)-36.9 ??C (98.4 ??F)] 36.7 ??C (98 ??F)  Pulse:  [45-80] 70  SpO2 Pulse:  [45-91] 72  Resp:  [16-27] 17  BP: (81-130)/(32-78) 110/72  SpO2:  [93 %-98 %] 98 %    General Appearance:Ill-appearing, in no acute distress   HEENT: Head is atraumatic and normocephalic. Sclera anicteric without injection. Oropharyngeal membranes are moist with no erythema or exudate.  Cardiopulmonary: Regular rate and rhythm. Hypotensive. On RA without accessory muscle use  Extremities: No clubbing, cyanosis, or edema.    Neurological Examination:   Mental Status: Drowsy but awakens, follows all simple commands,slow responses. Oriented to self, year, month, and being in the hospital. Answers simple and complex questions and follows all commands appropriately. Language with dysarthria, requires to repeat what he says to be understood. Memory for recent events intact.     On rounds laying with eyes closed, not awakening to tactile stimulation but waking up after sternal rub and noxious, started being irritated and asking examiner to stop and why do doctors always do this when it is painful. Afterwards throughout interview almost falling asleep    Cranial Nerves: Fields intact. PERRL. Restricted left gaze though crosses midline and can nearly bury with right-beating nystagmus on left gaze persistent (previously documented), other EOM intact. Face symmetric at rest and with activation. Hearing intact. Tongue midline.    Motor Exam: Normal bulk.  No tremors, myoclonus, or other adventitious movement.  Strength appears full in BUE and BLE in proximal and distal extremities     Reflexes: Deferred.    Sensory: Sensation normal to light touch in both hands and both feet    Cerebellar/Coordination/Gait: Not assessed today, previously: Finger-to-nose is  normal without ataxia or dysmetria bilaterally.  .      Diagnostic Studies    EMU 01/2022: electroclinical seizure with rapid blinking -> left gaze -> left tonic -> GTC  cvEEG 03/29/22: rare bitemporal sharps, no seizure  rEEG 02/02/24: normal  MRI 08/2024 stable cerebellar atrophy    Imaging and relevant diagnostic studies reviewed and interpreted, pertinent findings as in above assessment and plan.           [1]   Allergies  Allergen Reactions    Lacosamide  Other (See Comments)     Psychosis    Hallucinations   [2]   Current Facility-Administered Medications:     acetaminophen  (TYLENOL ) tablet 650 mg, 650 mg, Oral, Q6H PRN, Cookmeyer, Alm CROME, MD    aluminum-magnesium  hydroxide-simethicone (MAALOX MAX) 80-80-8 mg/mL oral suspension, 30 mL, Oral, Q4H PRN, Cookmeyer, Alm CROME, MD    cenobamate  (XCOPRI ) tablet 200 mg, 200 mg, Oral, Daily, Cookmeyer, Alm CROME, MD, 200 mg at 12/10/24 9158    cetirizine  (ZYRTEC ) tablet 10 mg, 10 mg, Oral, Q AM, Cookmeyer, Alm CROME, MD, 10 mg at 12/10/24 0841    clonazePAM  (KlonoPIN ) tablet 1 mg, 1 mg, Oral, Daily, Cookmeyer, Alm CROME, MD, 1 mg at 12/10/24 0841    clonazePAM  (KlonoPIN ) tablet 2 mg, 2 mg, Oral, Nightly, Cookmeyer, Alm CROME, MD, 2 mg at 12/09/24 2159    divalproex  ER (DEPAKOTE  ER) extended released 24 hr tablet 1,500 mg, 1,500 mg, Oral, Nightly, Cookmeyer, Alm CROME, MD, 1,500 mg at 12/09/24 2159    enoxaparin  (LOVENOX ) syringe 40 mg, 40 mg, Subcutaneous, Q24H, Cookmeyer, Alm CROME, MD, 40 mg at 12/10/24 0841    famotidine (PF) (PEPCID) injection 20 mg, 20 mg, Intravenous, BID, Johnetta Savant, MD, 20 mg at 12/10/24 9158    folic acid  (FOLVITE ) tablet 1 mg, 1 mg, Oral, Daily, Cookmeyer, Alm CROME, MD, 1 mg at 12/10/24 0841    guaiFENesin (ROBITUSSIN) oral syrup, 200 mg, Oral, Q4H PRN, Cookmeyer, Alm CROME, MD    haloperidol  (HALDOL ) tablet 15 mg, 15 mg, Oral, Nightly, Cookmeyer, Alm CROME, MD, 15 mg at 12/09/24 2159    lactated ringers bolus 1,000 mL, 1,000 mL, Intravenous, Once, Johnetta Savant, MD    lamoTRIgine  (LaMICtal ) tablet 200 mg, 200 mg, Oral, BID, Cookmeyer, Alm CROME, MD, 200 mg at 12/10/24 0841    levocarnitine  (CARNITOR ) (SF) oral solution, 500 mg, Oral, BID, Cookmeyer, Alm CROME, MD, 500 mg at 12/10/24 0841    melatonin tablet 3 mg, 3 mg, Oral, Nightly PRN, Cookmeyer, Alm CROME, MD    multivitamins, therapeutic with minerals tablet 1 tablet, 1 tablet, Oral, Daily, Cookmeyer, Alm CROME, MD, 1 tablet at 12/10/24 0840    NORepinephrine 8 mg in dextrose  5 % 250 mL (32 mcg/mL) infusion PMB, 0-30 mcg/min, Intravenous, Continuous, Cookmeyer, Alm CROME, MD, Paused at 12/10/24 1151    piperacillin-tazobactam (ZOSYN) IVPB 4.5 g in D5W 100 mL IVPB (premade), 4.5 g, Intravenous, Q6H, Johnetta Savant, MD, Stopped at 12/10/24 1146    senna (SENOKOT) tablet 2 tablet, 2 tablet, Oral, Nightly PRN, Cookmeyer, Alm CROME, MD    thiamine  (B-1) injection 200 mg, 200 mg, Intravenous, Q8H SCH, Johnetta Savant, MD, 200 mg at 12/10/24 1224  [3]   Past Medical History:  Diagnosis Date    Fall     from medications    Hepatitis B surface antigen positive     Schizophrenia (CMS-HCC)     Seizure    (CMS-HCC)     TBI (traumatic brain injury) (  CMS-HCC)     Vitamin D deficiency    [4]   Past Surgical History:  Procedure Laterality Date    MANDIBLE FRACTURE SURGERY     [5]   Social History  Socioeconomic History    Marital status: Single     Spouse name: None    Number of children: None    Years of education: None    Highest education level: None   Tobacco Use    Smoking status: Every Day     Current packs/day: 1.00     Average packs/day: 1 pack/day for 10.0 years (10.0 ttl pk-yrs)     Types: Cigarettes    Smokeless tobacco: Never    Tobacco comments:     10-20cpd   Vaping Use    Vaping status: Never Used   Substance and Sexual Activity    Alcohol use: Not Currently     Comment: rare    Drug use: Yes     Frequency: 7.0 times per week     Types: Marijuana, Cocaine   Social History Narrative    Updated May 2023        PSYCHIATRIC HX:     -Prior Dx: Schizophrenia. Seizures started ~age 20 around same time of head injury (unknown mechanism), psychotic symptoms ~age 81 by chart review. Seizures intractible for many years, in 2021 was found to be GAD65+ in serum and CSF, has been receiving IVIG + cellcept  or rituximab since then. Historically in post-ictal state has had more psychotic symptoms/agitation.     -Hospitalizations: 10+ prior including at [facility]; most recent at [facility] in 2021, involuntarily on 3NSH due to psychosis. Last known in Nov 2022 at [facility] for delusions/AH    -Suicide attempts: denies     -Non-suicidal self-injury: denies      -Medication Trials: Zyprexa (stopped due to fear of increased seizure risk, was on doses as high as 40mg ), Risperdal  (and Zyprexa and risperidone  together at times), Geodon, abilify, Lamictal , Invega Sustenna x 2 months and stopped at [facility]. In spring 2023 admitted to Neuro on risperidone  3mg  BID, while hospitalized changed to haldol  5mg  BID (May 2023)    -Med compliance Hx: fair     -Current treatment: ACT team through [facility]    -Past Psych: [facility], [facility] in [place]. [facility]- many group homes and different ACT/CST in past        SUBSTANCE ABUSE HX:    # Marijuana: several times a day since age 20, reported use as recently as 2022    # Cocaine: prior use, occasionally. Last reported use ~2012    # Alcohol: Patient denies recent use. No known hx of Sz/DT/Complicated WD Hx, in 2016 drinking 2-3 drinks a day         Social Hx:    -Current Living Environment: Currently living with parents; previously at group homes; [facility] and [facility] in [place]    -Guardian: Father; Marcellino Fidalgo (home [phone]), cell [phone])    -Sexual orientation: openly homosexual to family from 2016, also has a child     -Education/School Performance: High school, regular classes     -Income/Employment: disability check; -dad is his payee    -Legal/School Discipline: breaking and entering, assault charges     -Abuse/Neglect/Trauma/DV: denies     -Violence (perp): previous assault charges     -Weapons Access: denies guns        FAMILY HX    -Mental Illness: denies     -  Suicide: denies     -Substance Abuse: extensive history alcohol and recreational drug use     Social Drivers of Health     Food Insecurity: Food Insecurity Present (10/09/2023)    Received from [facility]    Hunger Vital Sign     Within the past 12 months, you worried that your food would run out before you got the money to buy more.: Often true     Within the past 12 months, the food you bought just didn't last and you didn't have money to get more.: Sometimes true   Tobacco Use: High Risk (12/09/2024)    Patient History     Smoking Tobacco Use: Every Day     Smokeless Tobacco Use: Never   Transportation Needs: No Transportation Needs (10/09/2023)    Received from [facility] - Transportation     In the past 12 months, has lack of transportation kept you from medical appointments or from getting medications?: No     Lack of Transportation (Non-Medical): No   Alcohol Use: Not At Risk (07/01/2024)    Received from [facility]    AUDIT-C     Q1: How often do you have a drink containing alcohol?: Never     Q2: How many drinks containing alcohol do you have on a typical day when you are drinking?: Patient does not drink   Housing: Unknown (10/09/2023)    Received from [facility]    Housing Stability Vital Sign     In the last 12 months, was there a time when you were not able to pay the mortgage or rent on time?: No     At any time in the past 12 months, were you homeless or living in a shelter (including now)?: No   Utilities: Not At Risk (10/09/2023)    Received from [facility] Utilities     Threatened with loss of utilities: No   Interpersonal Safety: Not At Risk (12/08/2024)    Interpersonal Safety     Unsafe Where You Currently Live: No     Physically Hurt by Anyone: No     Abused by Anyone: No   Financial Resource Strain: Low Risk  (10/09/2023)    Received from [facility]    Overall Financial Resource Strain (CARDIA)     Difficulty of Paying Living Expenses: Not very hard   [6]   Family History  Problem Relation Age of Onset    Hypertension Mother     Hypertension Father     Diabetes Father     Cancer Maternal Aunt     Cancer Maternal Grandmother     Cancer Paternal Grandfather     Seizures Neg Hx

## 2024-12-10 NOTE — Progress Notes (Signed)
 MICU Daily Progress Note     Date of Service: 12/10/2024    Problem List:   Principal Problem:    Shock, unspecified    (CMS-HCC)  Active Problems:    Seizures    (CMS-HCC)    Septic shock    (CMS-HCC)    Upper respiratory infection, viral    Infection due to human metapneumovirus (hMPV)      Interval history: Austin Banks is a 34 y.o. 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 He was admitted to the MICU on 1/14 for hypotension and ongoing pressor requirements.       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 No witnessed seizure activity at home. Home anti-epileptics of Depakote , Xcopri , Lamictal , Tocilizumab , Klonopin , and Nayzilam  for rescue. Patient reports frequently missing doses of antiepileptic medications. Reports multiple falls without head strike or LOC. CT head negative for acute intracranial process. EEG without discharges, not concerning for seizure activity. Therapeutic on valproate.   Could be a reactivation, on tocilizumab , but query whether the patient is taking given his baseline functional status, living alone.  With ongoing viral infection, ADEM also remains on the differential  - Brain MRI with and without  - Neurology consulted, appreciate recs. Low concern for seizures at this time. Patient has increased risk for breakthrough seizure with lowered seizure threshold iso infection, substance use, and missed medication doses.   - EEG  - Follow-up Lamictal  level  - Continue home antiepileptic regimen  - TSH low, order fT4  - RPR, HIV ordered     Hx polysubstance use  Nystagmus  Documented history of crack cocaine, tobacco, methamphetamine, and marijuana use. Urine tox in ED positive for amphetamines and cocaine. Negative ethanol on admission.  Presented with elevated AST. Smoked and snorted amphetamines prior to admission. Parents at bedside are aware of his substance use. Unclear how much etoh or if benzo/barb use present. Nystagmus present 1/15  - CIWA protocol     Dysmetria  Altered Mental Status  Patient has been lethargic, somnolent, and globally slow to respond since admission. His responsiveness has improved since this morning. Parents noted he was having difficulty eating with a fork, frequently bringing food to the wrong part of his face. C/f low vision. CT head 1/13 showed mild brain volume loss of the cerebellum, a new finding compared to prior imaging in 01/2024. Bedside cerebellar exam could not be completed due to lethargy, somnolence. Pupils are small but equal and reactive to light. Folate/b12 wnl. Previously taking thiamine  supplement, stopped earlier this year. Lower BMI, c/f chronic malnut.   - IV thiamine   - Neurology following, appreciate recs  - Continue to monitor with daily cerebellar, eye exam  - Serum ammonia wnl  - PT/OT  - hold nightly haloperidol         Analgesia: Pain adequately controlled  RASS at goal? N/A, not on sedation  [place] Agitation Assessment Scale (RASS) : -1 (12/10/2024  8:00 AM)       Pulmonary   Viral upper respiratory infection, Metapneumovirus +  Presented with 2-3 days of general malaise, fevers, and productive cough. On  arrival to ED patient had refractory hypotension requiring pressors (see below) and leukocytosis with WBC 13. CXR appears to be consistent with viral infection, however read is unable to exclude pneumonia.  - now on RA  - repeat VBG daily iso somnolence, c/f cerebellar dysfunction    Recent Labs     Units 12/10/24  1708   PHART  7.43   PCO2ART mm Hg 41.6   PO2ART mm Hg 79.3*   HCO3ART mmol/L 27   BEART  3.2* O2SATART % 96.1            Cardiovascular   Septic shock secondary to Metapneumovirus versus pneumonia  Presented with hypotension refractory to 2 L IV fluids.  Lactate peaked at 3.7, improved with fluids. Started on pressors.   - A line  - remains on NE gtt, MAP goal >65  - See ID section for full infectious workup    Renal   AKI  Creatinine 2.13 at presentation (no history of prior renal dysfunction). Likely prerenal iso viral infection and multiple days of malaise and poor appetite. Given 2L IV fluids in the ED. UA with protein, improved Cr 0.99 (1.32). Wondering if there is a component of rhabdo iso drug use and seizures  - Daily BMP  - add on CK, trend if elevated      Intake/Output Summary (Last 24 hours) at 12/10/2024 1025  Last data filed at 12/10/2024 0800  Gross per 24 hour   Intake 3755.54 ml   Output 775 ml   Net 2980.54 ml         Infectious Disease/Autoimmune   Septic Shock  Metapneumo upper respiratory viral infection  ?Pneumonia  Presented with multiple days of weakness, fevers, cough, hypotension, and leukocytosis WBC 13. Hypotension refractory to 2L IV fluids, ultimately requiring initiation of pressors. Infectious workup started in the ED due to persist hypotension and history of immunocompromise (takes Tocilizumab  for epilepsy). RPP positive for metapneumovirus. CXR with concerns for atelectasis vs aspiration, pneumonia cannot be excluded. Empirically treated with Vanc, Zosyn  in ED. Bcx NGTD, negative MRSA screen. UA not concerning for infection  - RUQ ultrasound  - discontinue Zosyn   - continue Vanc for empiric pneumonia coverage  - Symptomatic treatment of viral URI  - Pressors for hypotension as above (See Cardio section)  - Consider infectious disease consult given severity of septic shock and history of immunosuppression with Tocilizumab     Cultures:  Blood Culture, Routine (no units)   Date Value   12/08/2024 No Growth at 24 hours   12/08/2024 No Growth at 24 hours     WBC (10*9/L)   Date Value   12/10/2024 8.5     WBC, UA (/HPF)   Date Value   12/09/2024 <1            FEN/GI   NAI    Provider Malnutrition Assessment:  Body mass index is 19.92 kg/m??.BMI Interpretation: within normal limits.  GLIM criteria:   Pt with severe muscle loss (e.g., severe temporal wasting) -- severe malnutrition  -I have screened this patient for malnutrition and they did meet criteria for malnutrition based on GLIM criteria.  -Nutrition consulted yes, see RD assessment below  RD assessment:Not done yet.           Heme/Coag   Thrombocytopenia  Platelets 66. No prior recorded episodes of thrombocytopenia. LFT significant for mildly elevated AST. Low concern for active hemorrhage. Consider ITP iso recent viral infection; diagnosis of exclusion. B12, Folate wnl  - Daily  CBC,   - Peripheral smear    Endocrine   NAI    Integumentary     #  - WOCN consulted for high risk skin assessment No. Reason: n/a.  - WOCN recs >> n/a  - cont pressure mitigating precautions per skin policy    Prophylaxis/LDA/Restraints/Consults   ICU Checklist completed:  see ICU rounding navigator      Patient Lines/Drains/Airways Status       Active Active Lines, Drains, & Airways       Name Placement date Placement time Site Days    Peripheral IV 12/08/24 Right Antecubital 12/08/24  1955  Antecubital  1    Peripheral IV 12/08/24 Anterior;Left Forearm 12/08/24  2154  Forearm  1                  Patient Lines/Drains/Airways Status       Active Wounds       None                    Goals of Care     Code Status:   Orders Placed This Encounter   Procedures    Full Code     Standing Status:   Standing     Number of Occurrences:   1        Designated Healthcare Decision Maker:  Mr. Frankson designated healthcare decision maker(s) is/are   HCDM (patient stated preference): Loyed, Wilmes - Father - [phone]    HCDM (patient stated preference): Hines, Kloss - Mother - [phone]. See HCDM section of Epic sidebar/storyboard or ACP tab in patient chart for details regarding active HCDMs and patient capacity for decision-making.      Subjective     Patient lives alone, surrounded by other drug users. Father at bedside suspects patient is taking medications. Near his baseline, dad suspects he seizes daily. Has tried to come down on Klonopin  in the past with worsened seizures.     Objective     Vitals - past 24 hours  Temp:  [36.6 ??C (97.9 ??F)-36.9 ??C (98.4 ??F)] 36.6 ??C (97.9 ??F)  Pulse:  [44-80] 61  SpO2 Pulse:  [45-91] 60  Resp:  [16-27] 21  BP: (81-130)/(32-86) 89/64  SpO2:  [88 %-97 %] 96 % Intake/Output  I/O last 3 completed shifts:  In: 5133.3 [I.V.:268.3; IV Piggyback:4865]  Out: 1025 [Urine:1025]     Physical Exam:    General: Lying in bed, no acute distress. Falls asleep during the interview but easily awakens to voice.  HEENT: Normocephalic, atraumatic. Moist mucous membranes.  CV: Regular rate and rhythm. No murmurs auscultated.  Pulm: Diffuse coarse breath sounds throughout lung fields.  GI: Non-distended, non-tender to palpation.  Skin: No rashes or wounds appreciated on clothed exam.  Neuro: Confused, bradykinesia, nearly catatonic at times. Unable to hold conversation      Continuous Infusions:   Infusions Meds[1]    Scheduled Medications:   Scheduled Medications[2]    PRN medications:  PRN Medications[3]    Data/Imaging Review: Reviewed in Epic and personally interpreted on 12/10/2024. See EMR for detailed results.      Artist Ill, MD  PGY1 Internal Medicine               [1]    NORepinephrine  bitartrate-NS 5 mcg/min (12/10/24 1010)   [2]    cenobamate   200 mg Oral Daily    cetirizine   10 mg Oral Q AM    clonazePAM   1 mg Oral Daily  clonazePAM   2 mg Oral Nightly    divalproex  ER  1,500 mg Oral Nightly    enoxaparin  (LOVENOX ) injection  40 mg Subcutaneous Q24H    famotidine  (PEPCID ) IV  20 mg Intravenous BID    folic acid   1 mg Oral Daily    haloperidol   15 mg Oral Nightly    lamoTRIgine   200 mg Oral BID    levocarnitine  (SF)  500 mg Oral BID multivitamins (ADULT)  1 tablet Oral Daily    piperacillin -tazobactam (ZOSYN ) IV (intermittent)  4.5 g Intravenous Q6H   [3] acetaminophen , aluminum-magnesium  hydroxide-simethicone, guaiFENesin , melatonin, senna

## 2024-12-10 NOTE — Progress Notes (Signed)
 12/10/2024 [facility] Refill Coordination Note    Specialty Medication(s) to be Shipped:   Neurology: ACTEMRA  ACTPEN 162 mg/0.9 mL Pnij subcutaneous injection (tocilizumab )    Other medication(s) to be shipped: No additional medications requested for fill at this time    Specialty Medications not needed at this time: N/A     Austin Banks, DOB: Apr 12, 1991  Phone: [phone] (home)       All above HIPAA information was verified with patient's family member, mother.     Was a nurse, learning disability used for this call? No    Completed refill call assessment today to schedule patient's medication shipment from the [facility]  [phone]).  All relevant notes have been reviewed.     Specialty medication(s) and dose(s) confirmed: Regimen is correct and unchanged.   Changes to medications: [name] reports no changes at this time.  Changes to insurance: No  New side effects reported not previously addressed with a pharmacist or physician: None reported  Questions for the pharmacist: No    Confirmed patient received a Conservation Officer, Historic Buildings and a Surveyor, Mining with first shipment. The patient will receive a drug information handout for each medication shipped and additional FDA Medication Guides as required.       DISEASE/MEDICATION-SPECIFIC INFORMATION        N/A    SPECIALTY MEDICATION ADHERENCE     Medication Adherence    Patient reported X missed doses in the last month: 0  Specialty Medication: ACTEMRA  ACTPEN 162 mg/0.9 mL Pnij subcutaneous injection (tocilizumab )  Patient is on additional specialty medications: No              Were doses missed due to medication being on hold? No    ACTEMRA  ACTPEN 162 mg/0.9 mL Pnij subcutaneous injection (tocilizumab ): 1 doses of medicine on hand     Specialty medication is an injection or given on a cycle: Yes, Next injection is scheduled for 12/11/24.    REFERRAL TO PHARMACIST     Referral to the pharmacist: Not needed      [facility]     Shipping address confirmed in Epic.     Cost and Payment: Patient has a copay of $4. They are aware and have authorized the pharmacy to charge the credit card on file.    Delivery Scheduled: Yes, Expected medication delivery date: 12/16/24.     Medication will be delivered via Next Day Courier to the prescription address in Epic WAM.    Austin Banks   [facility]  Specialty Technician

## 2024-12-10 NOTE — Procedures (Signed)
 FINAL LONGTERM VIDEO EEG MONITORING REPORT    Identifying Information   NAME: Austin Banks    MRN: [id number]   DOB: 09-18-1991    Ordering Provider:  Referred Self   LOC: [id number]    Study Information  EEG Start: 12/10/24 at 0835  EEG End: 12/10/2024 at 1241    HISTORY: 34 y.o. male with history of epilepsy thought to be secondary to GAD 82 with  typical semiologies of 1) blinking, left gaze, left facial twitching, LUE flexion, LLE tonic-clonic to generalized. Post-ictal confusion up to 20 minutes and 2) left gaze, behavioral arrest with post-ictal confusional state often with attempt to remove clothes lasting about 10 minutes. He has had cough, nausea, vomiting, malaise with generalized weakness for the last few days      INDICATION:  Seizures     PATIENT STATE: Awake and Sleep     EEG TECHNICAL DESCRIPTION   Conditions of Recording:  Continuous EEG with simultaneous video recording was performed utilizing 21 active electrodes placed according to the international 10-20 system.  The study was recorded digitally with a bandpass of 1-70Hz  and a sampling rate of 200Hz  and was reviewed with the possibility of multiple reformatting.  The study was digitally processed with potential spike and seizure events identified for physician analysis and review.  Patient recognized events were identified by a push button marker and reviewed by the physician. Simultaneous video was reviewed for all patient events.    DAY 1 EEG DESCRIPTION: 12/10/24  at 0835 to 12/10/2024 at 1241 Marciana JAYSON Blake, MD)   Relevant Medications: cenobamate  (Xcopri ) 200 mg daily, clonazepam  (Klonopin ) 1 mg in the morning and 2 mg in the evening, divalproex  (Depakote ) 1500 mg nightly, and lamotrigine  (Lamictal ) 200 mg twice daily    Background:  The waking background showed retained organization. There was a slow posterior rhythm of 8 hz, which was symmetric and had reactivity. Anteriorly, there was more than expected 4-6 hz theta delta slowing. Attenuation of the occipital rhythm accompanied drowsiness.  The sleep background was appropriately organized with well-developed spindles and vertex waves.  These sleep transients showed appropriate morphology and are bilaterally synchronous and symmetrical.     Interictal Epileptiform Activity:  There were no epileptiform abnormalities or periodic patterns noted.    Ictal Activity:  There were no ictal patterns noted.    Events:  There were no clinical events.    EEG SUMMARY INTERPRETATION:   This is an abnormal awake and asleep EEG due to:    Diffuse slowing with retained reactivity and sleep states.     CLINICAL CORRELATION / SUMMARY of RECORDING:   These findings are consistent with a non-specific mild degree of encephalopathy.    and There were no epileptiform discharges or seizures.    Interpreting Provider: Macario JAYSON Blake, MD     [id number] Seizure Risk Score  Clinical risk score based on EEG findings and clinical history of seizures to aid in determination of optimal duration of EEG monitoring for detection of electrographic seizures.  Score has not been validated in patients under age 57 or following cardiac arrest.    No Pertinent EEG findings - 0 points     Add 1 point if known history of epilepsy or prior clinical seizure.      Risk Group     Seizure Risk   at 72 hours   Duration of Monitoring    Seizure risk < 5%     Duration of Monitoring    Seizure  risk < 2%       Low Risk,   Score = 0     3.1%   1 Hour   3.3 Hours     Medium Risk,   Score = 1     12%   12 Hours   29 Hours     High Risk,   Score = 2 or greater                >25%   >24 Hours   >30 Hours   Struck et al JAMA Neurology, January 2020    Not appropriate for EEG monitoring being performed for the following:  Treatment of status epilepticus or seizures already documented on EEG  Monitoring sedation/ burst suppression for management of intracranial pressure and/or paralyzed patients  Diagnostic evaluation of transient episodes concerning for possible seizures (spell capture)   Patients s/p cardiac arrest undergoing targeted temperature management      Amon dunker al. American Clinical Neurophysiology Society's Standardized Critical Care EEG Terminology: 2021 Version. Journal of Clinical Neurophysiology 38(1):p 1-29, January 2021.

## 2024-12-11 LAB — BASIC METABOLIC PANEL
ANION GAP: 7 mmol/L (ref 5–14)
BLOOD UREA NITROGEN: 8 mg/dL — ABNORMAL LOW (ref 9–23)
BUN / CREAT RATIO: 9
CALCIUM: 8.2 mg/dL — ABNORMAL LOW (ref 8.7–10.4)
CHLORIDE: 110 mmol/L — ABNORMAL HIGH (ref 98–107)
CO2: 26 mmol/L (ref 20.0–31.0)
CREATININE: 0.85 mg/dL (ref 0.73–1.18)
EGFR CKD-EPI (2021) MALE: >90 mL/min/1.73m2 (ref >=60)
GLUCOSE RANDOM: 97 mg/dL (ref 70–179)
POTASSIUM: 3.8 mmol/L (ref 3.4–4.8)
SODIUM: 143 mmol/L (ref 135–145)

## 2024-12-11 LAB — BLOOD GAS CRITICAL CARE PANEL, ARTERIAL
BASE EXCESS ARTERIAL: -0.3 (ref -2.0–2.0)
BASE EXCESS ARTERIAL: 2.6 — ABNORMAL HIGH (ref -2.0–2.0)
CALCIUM IONIZED ARTERIAL (MG/DL): 4.7 mg/dL (ref 4.40–5.40)
CALCIUM IONIZED ARTERIAL (MG/DL): 4.74 mg/dL (ref 4.40–5.40)
CARBOXYHEMOGLOBIN: 1.2 % — ABNORMAL HIGH (ref <1.2)
CHLORIDE, WHOLE BLOOD: 111 mmol/L — ABNORMAL HIGH (ref 98–107)
GLUCOSE WHOLE BLOOD: 85 mg/dL (ref 70–179)
GLUCOSE WHOLE BLOOD: 123 mg/dL (ref 70–179)
HCO3 ARTERIAL: 24 mmol/L (ref 22–27)
HCO3 ARTERIAL: 27 mmol/L (ref 22–27)
HEMOGLOBIN BLOOD GAS: 12.7 g/dL — ABNORMAL LOW (ref 13.50–17.50)
HEMOGLOBIN BLOOD GAS: 14.2 g/dL (ref 13.50–17.50)
LACTATE BLOOD ARTERIAL: 1.4 mmol/L — ABNORMAL HIGH (ref <1.3)
LACTATE BLOOD ARTERIAL: 1.6 mmol/L — ABNORMAL HIGH (ref <1.3)
METHEMOGLOBIN: <1 % (ref <1.5)
O2 SATURATION ARTERIAL: 97.9 % (ref 94.0–100.0)
O2 SATURATION ARTERIAL: 95.3 % (ref 94.0–100.0)
OXYHEMOGLOBIN: 96.4 % (ref 94.0–100.0)
OXYHEMOGLOBIN: 94.1 % (ref 94.0–100.0)
PCO2 ARTERIAL: 37.8 mmHg (ref 35.0–45.0)
PCO2 ARTERIAL: 39.2 mmHg (ref 35.0–45.0)
PH ARTERIAL: 7.41 (ref 7.35–7.45)
PH ARTERIAL: 7.44 (ref 7.35–7.45)
PO2 ARTERIAL: 95.7 mmHg (ref 80.0–110.0)
PO2 ARTERIAL: 73 mmHg — ABNORMAL LOW (ref 80.0–110.0)
POTASSIUM WHOLE BLOOD: 3.8 mmol/L (ref 3.4–4.6)
POTASSIUM WHOLE BLOOD: 3.6 mmol/L (ref 3.4–4.6)
SODIUM WHOLE BLOOD: 144 mmol/L (ref 135–145)
SODIUM WHOLE BLOOD: 142 mmol/L (ref 135–145)

## 2024-12-11 LAB — CBC W/ AUTO DIFF
BASOPHILS ABSOLUTE COUNT: 0 10*9/L (ref 0.0–0.1)
BASOPHILS RELATIVE PERCENT: 1 %
EOSINOPHILS ABSOLUTE COUNT: 0 10*9/L (ref 0.0–0.5)
EOSINOPHILS RELATIVE PERCENT: 0.7 %
HEMATOCRIT: 38.1 % — ABNORMAL LOW (ref 39.0–48.0)
HEMOGLOBIN: 13.1 g/dL (ref 12.9–16.5)
LYMPHOCYTES ABSOLUTE COUNT: 1.9 10*9/L (ref 1.1–3.6)
LYMPHOCYTES RELATIVE PERCENT: 41.2 %
MEAN CORPUSCULAR HEMOGLOBIN CONC: 34.5 g/dL (ref 32.0–36.0)
MEAN CORPUSCULAR HEMOGLOBIN: 32.6 pg — ABNORMAL HIGH (ref 25.9–32.4)
MEAN CORPUSCULAR VOLUME: 94.3 fL (ref 77.6–95.7)
MEAN PLATELET VOLUME: 8.3 fL (ref 6.8–10.7)
MONOCYTES ABSOLUTE COUNT: 0.6 10*9/L (ref 0.3–0.8)
MONOCYTES RELATIVE PERCENT: 12.2 %
NEUTROPHILS ABSOLUTE COUNT: 2.1 10*9/L (ref 1.8–7.8)
NEUTROPHILS RELATIVE PERCENT: 44.9 %
PLATELET COUNT: 19 10*9/L — ABNORMAL LOW (ref 150–450)
RED BLOOD CELL COUNT: 4.04 10*12/L — ABNORMAL LOW (ref 4.26–5.60)
RED CELL DISTRIBUTION WIDTH: 13.3 % (ref 12.2–15.2)
WBC ADJUSTED: 4.6 10*9/L (ref 3.6–11.2)

## 2024-12-11 LAB — APTT
APTT: 42.8 s — ABNORMAL HIGH (ref 24.8–38.4)
HEPARIN CORRELATION: 0.2

## 2024-12-11 LAB — CBC
HEMATOCRIT: 40.8 % (ref 39.0–48.0)
HEMOGLOBIN: 13.8 g/dL (ref 12.9–16.5)
MEAN CORPUSCULAR HEMOGLOBIN CONC: 33.8 g/dL (ref 32.0–36.0)
MEAN CORPUSCULAR HEMOGLOBIN: 32.1 pg (ref 25.9–32.4)
MEAN CORPUSCULAR VOLUME: 95.2 fL (ref 77.6–95.7)
MEAN PLATELET VOLUME: 9 fL (ref 6.8–10.7)
PLATELET COUNT: 68 10*9/L — ABNORMAL LOW (ref 150–450)
RED BLOOD CELL COUNT: 4.29 10*12/L (ref 4.26–5.60)
RED CELL DISTRIBUTION WIDTH: 13.2 % (ref 12.2–15.2)
WBC ADJUSTED: 3.2 10*9/L — ABNORMAL LOW (ref 3.6–11.2)

## 2024-12-11 LAB — PROTIME-INR
INR: 0.96
PROTIME: 10.9 s (ref 9.9–12.6)

## 2024-12-11 LAB — D-DIMER, QUANTITATIVE: D-DIMER QUANTITATIVE (ACL TOP): 585 ng{FEU}/mL — ABNORMAL HIGH (ref <=500)

## 2024-12-11 LAB — PLATELET COUNT: PLATELET COUNT: 66 10*9/L — ABNORMAL LOW (ref 150–450)

## 2024-12-11 LAB — LAMOTRIGINE LEVEL: LAMOTRIGINE LEVEL: 5.8 ug/mL

## 2024-12-11 LAB — SLIDE REVIEW

## 2024-12-11 LAB — FIBRINOGEN: FIBRINOGEN LEVEL: 137 mg/dL — ABNORMAL LOW (ref 175–500)

## 2024-12-11 MED ADMIN — levocarnitine (CARNITOR) (SF) oral solution: 500 mg | ORAL | @ 01:00:00

## 2024-12-11 MED ADMIN — levocarnitine (CARNITOR) (SF) oral solution: 500 mg | ORAL | @ 14:00:00

## 2024-12-11 MED ADMIN — clonazePAM (KlonoPIN) tablet 1 mg: 1 mg | ORAL | @ 14:00:00

## 2024-12-11 MED ADMIN — cetirizine (ZYRTEC) tablet 10 mg: 10 mg | ORAL | @ 14:00:00

## 2024-12-11 MED ADMIN — enoxaparin (LOVENOX) syringe 40 mg: 40 mg | SUBCUTANEOUS | @ 14:00:00

## 2024-12-11 MED ADMIN — folic acid (FOLVITE) tablet 1 mg: 1 mg | ORAL | @ 14:00:00

## 2024-12-11 MED ADMIN — lamoTRIgine (LaMICtal) tablet 200 mg: 200 mg | ORAL | @ 01:00:00

## 2024-12-11 MED ADMIN — lamoTRIgine (LaMICtal) tablet 200 mg: 200 mg | ORAL | @ 14:00:00

## 2024-12-11 MED ADMIN — famotidine (PF) (PEPCID) injection 20 mg: 20 mg | INTRAVENOUS | @ 01:00:00

## 2024-12-11 MED ADMIN — famotidine (PF) (PEPCID) injection 20 mg: 20 mg | INTRAVENOUS | @ 14:00:00

## 2024-12-11 MED ADMIN — clonazePAM (KlonoPIN) tablet 2 mg: 2 mg | ORAL | @ 02:00:00

## 2024-12-11 MED ADMIN — multivitamins, therapeutic with minerals tablet 1 tablet: 1 | ORAL | @ 14:00:00

## 2024-12-11 MED ADMIN — piperacillin-tazobactam (ZOSYN) IVPB 4.5 g in D5W 100 mL IVPB (premade): 4.5 g | INTRAVENOUS | @ 14:00:00 | Stop: 2024-12-11

## 2024-12-11 MED ADMIN — piperacillin-tazobactam (ZOSYN) IVPB 4.5 g in D5W 100 mL IVPB (premade): 4.5 g | INTRAVENOUS | @ 04:00:00 | Stop: 2024-12-15

## 2024-12-11 MED ADMIN — cenobamate (XCOPRI) tablet 200 mg: 200 mg | ORAL | @ 16:00:00 | Stop: 2024-12-11

## 2024-12-11 MED ADMIN — thiamine (B-1) injection 200 mg: 200 mg | INTRAVENOUS | @ 11:00:00 | Stop: 2024-12-15

## 2024-12-11 MED ADMIN — thiamine (B-1) injection 200 mg: 200 mg | INTRAVENOUS | @ 02:00:00 | Stop: 2024-12-15

## 2024-12-11 MED ADMIN — thiamine (B-1) injection 200 mg: 200 mg | INTRAVENOUS | @ 18:00:00 | Stop: 2024-12-15

## 2024-12-11 MED ADMIN — cefTRIAXone (ROCEPHIN) 1 g in sodium chloride 0.9 % (NS) 100 mL IVPB-MBP: 1 g | INTRAVENOUS | @ 18:00:00 | Stop: 2024-12-15

## 2024-12-11 MED ADMIN — midodrine (PROAMATINE) tablet 5 mg: 5 mg | ORAL | @ 18:00:00 | Stop: 2024-12-11

## 2024-12-11 MED ADMIN — divalproex ER (DEPAKOTE ER) extended released 24 hr tablet 1,500 mg: 1500 mg | ORAL | @ 01:00:00

## 2024-12-11 MED ADMIN — melatonin tablet 3 mg: 3 mg | ORAL | @ 01:00:00

## 2024-12-11 MED ADMIN — lactated ringers bolus 1,000 mL: 1000 mL | INTRAVENOUS | @ 11:00:00 | Stop: 2024-12-11

## 2024-12-11 MED ADMIN — **Patient Supplied** tocilizumab (ACTEMRA ACTPEN) subcutaneous injection 162 mg: 162 mg | SUBCUTANEOUS | @ 18:00:00 | NDC 50242014301

## 2024-12-11 NOTE — Consults (Signed)
 Physical Medicine and Rehabilitation  Inpatient Consult Note    Requesting Attending Physician: Rosina Trenton Sauers, MD  Service Requesting Consult: Medical ICU (MDI)  Thank you for this consult.  Please contact the PM&R consult pager [phone]) for questions regarding these recommendations.  For questions of bed availability at [facility], contact the Intake Office at [phone]. Please contact your case manager for updates on AIR process as they are in regular contact with the rehab intake office.    ASSESSMENT:   Austin Banks is a 34 y.o. 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 He was admitted to the medicine service for management of hypotension and neurology has been closely following as a consulting service. Found to be metapneumovirus+ and concurrently being treated empirically for CAP in the setting of leukocytosis and hypotension.    DISCHARGE RECOMMENDATIONS:  Patient's exam is reassuringly close to baseline (can also refer to neurology documentation of this) and was able to ambulate 28ft with RW and CGA. Suspect that once medical barriers to discharge are addressed that patient may be able to discharge home with home services. However, his participation with exams by providers has been inconsistent and I cannot confidently say that he would participate with the frequency of therapy required for AIR dispo. Considering that, if he were to need ongoing rehab prior to returning home due to lack of caregiver support (lives alone), he would be better served in a lower frequency therapy setting, such as subacute rehab.  - continue PT/OT while in acute hospital, appreciate increasing frequency of vistis when able based on staffing  - We will continue to follow for medical readiness and functional progression while admitted to the acute hospital. AIR PLANNING:  *This PM&R consult does not guarantee that patient has been accepted to [facility] AIR, but can be helpful to guide patient progression*    Checklist For Potential Admission to [facility] AIR:  [ ]  EDD/what is estimated discharge date?  [ ]  Referral from CM if wants [facility] AIR  [ ]  PT/OT within 48 hours of AIR  [ ]  dispo verified and [facility] AIR preference  [ ]  CBC/BMP & other pertinent labs within 48 hours of AIR  [ ]  no longer requiring pressors for management of hypotension    Discharge Plan After AIR:  - patient lives alone in a 1 level home with 3STE.      In order for the patient to be considered for admission to [facility] inpatient rehab, the case manager must give the patient / family choice in post-acute discharge options. If the patient desires [facility], a referral from case management is required for acceptance to [facility] inpatient rehab.  A referral for [facility] inpatient rehab has not been received.     _____________________________________________________________  Thank you for involving us  in the care of this patient.    Quaneshia Wareing, MD  Spinal Cord Injury Medicine  [facility] Physical Medicine & Rehabilitation      SUBJECTIVE   Reason for Consult: Patient seen in consultation at the request of Ashley Glyn Henderson, MD for evaluation of rehabilitation needs and recommendations.    History of Present Illness: Austin Banks is a 34 y.o. 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 He was admitted to the medicine  service for management of hypotension and neurology has been closely following as a consulting service.    Today  - found to have +metapneumovirus. CXR c/w viral infection but unable to exclude PNA in setting of leukocytosis and pressor requirement, so empirically being treated for PNA with CTX  - on norepi  - CIWA protocol  - no seizure activity on EEG, neurology recommended continuing home doses of medications (Depakote , Xcopri , Lamictal , Klonopin )  - Depakote  level therapeutic      Past Medical and Surgical History:   Past Medical History[1]  Past Surgical History[2]     Cancer History:  Hematology/Oncology History    No problem history exists.       Social History:   Short Social History[3]    Social History     Social History Narrative    Updated May 2023        PSYCHIATRIC HX:     -Prior Dx: Schizophrenia. Seizures started ~age 67 around same time of head injury (unknown mechanism), psychotic symptoms ~age 29 by chart review. Seizures intractible for many years, in 2021 was found to be GAD65+ in serum and CSF, has been receiving IVIG + cellcept  or rituximab since then. Historically in post-ictal state has had more psychotic symptoms/agitation.     -Hospitalizations: 10+ prior including at [facility]; most recent at [facility] in 2021, involuntarily on 3NSH due to psychosis. Last known in Nov 2022 at [facility] for delusions/AH    -Suicide attempts: denies     -Non-suicidal self-injury: denies      -Medication Trials: Zyprexa (stopped due to fear of increased seizure risk, was on doses as high as 40mg ), Risperdal  (and Zyprexa and risperidone  together at times), Geodon, abilify, Lamictal , Invega Sustenna x 2 months and stopped at [facility]. In spring 2023 admitted to Neuro on risperidone  3mg  BID, while hospitalized changed to haldol  5mg  BID (May 2023)    -Med compliance Hx: fair     -Current treatment: ACT team through [facility]    -Past Psych: [facility], [facility] in [place]. [facility]- many group homes and different ACT/CST in past        SUBSTANCE ABUSE HX:    # Marijuana: several times a day since age 80, reported use as recently as 2022    # Cocaine: prior use, occasionally. Last reported use ~2012    # Alcohol: Patient denies recent use. No known hx of Sz/DT/Complicated WD Hx, in 2016 drinking 2-3 drinks a day         Social Hx:    -Current Living Environment: Currently living with parents; previously at group homes; [facility] and [facility] in [place]    -Guardian: Father; Itzael Liptak (home [phone]), cell [phone])    -Sexual orientation: openly homosexual to family from 2016, also has a child     -Education/School Performance: High school, regular classes     -Income/Employment: disability check; -dad is his payee    -Legal/School Discipline: breaking and entering, assault charges     -Abuse/Neglect/Trauma/DV: denies     -Violence (perp): previous assault charges     -Weapons Access: denies guns        FAMILY HX    -Mental Illness: denies     -Suicide: denies     -Substance Abuse: extensive history alcohol and recreational drug use       Living Environment: Apartment  Lives With: Alone  Home Living: One level home, Stairs to enter without rails, Tub/shower unit, Shower chair  with back, Grab bars in shower, Standard height toilet, Grab bars around toilet  Number of Stairs to Enter (outside): 3    Family History: Reviewed, noncontributory to rehab  family history includes Cancer in his maternal aunt, maternal grandmother, and paternal grandfather; Diabetes in his father; Hypertension in his father and mother.    Allergies:   Heparin  analogues and Lacosamide     Medications:   Scheduled Scheduled meds with Route[4]  PRN acetaminophen , 650 mg, Q6H PRN  aluminum-magnesium  hydroxide-simethicone, 30 mL, Q4H PRN  guaiFENesin , 200 mg, Q4H PRN  melatonin, 3 mg, Nightly PRN  OLANZapine zydis, 5 mg, Daily PRN  senna, 2 tablet, Nightly PRN      Continuous Infusions NORepinephrine  bitartrate-NS, Last Rate: Stopped (12/11/24 1601)        Review of Systems:    10 point review of systems completed and pertinent findings noted above in HPI.    Prior Functional Status:  Prior Functional Status: PTA, pt was indep with mobility and ADL wtihout device, does not drive. 1 fall leading to this admission, seizure around Christmastime as well.    Current Functional Status: Therapy notes reviewed     Mobility:            Ambulation comments: amb ~40 ft within room with minA HHA, initially with very small shuffle steps, unsteady, progressing to increased step length and gait stability with cueing.  PT Post Acute Discharge Recommendations: 5x weekly, High intensity    Cognition, Swallow, Speech:   Cognition / Swallow / Speech  Patient's Vision Adequate to Safely Complete Daily Activities: Yes  Patient's Judgement Adequate to Safely Complete Daily Activities: Recent Change, Yes  Patient's Memory Adequate to Safely Complete Daily Activities: Recent Change, No  Patient Able to Express Needs/Desires: Yes  Patient has speech problem: No             Assistive Devices:  (shower chair)    Precautions:  Safety Interventions  Safety Interventions: environmental modification, fall reduction program maintained, low bed, nonskid shoes/slippers when out of bed  Aspiration Precautions: awake/alert before oral intake, oral hygiene care promoted  Bleeding Precautions: blood pressure closely monitored    OBJECTIVE:   Vitals:  Vitals:    12/11/24 1330 12/11/24 1345 12/11/24 1400 12/11/24 1415   BP:       Pulse: 73 58 59 (!) 44   Resp: 16 17 19 19    Temp:       TempSrc:       SpO2: 99% 99% 96% 96%   Weight:       Height:           Physical Exam:    GEN: Lying in bed in NAD.  HEENT: Atraumatic. Normocephalic. Moist mucous membranes. Trachea midline.  RESP: NWOB on RA.  CV: Regular rate and rhythm.  GU: no foley  SKIN: see wound care flow sheet    NEURO:  Mental Status: difficult to arouse. Does not open eyes to voice or to command, but will minimally open eyes and sustain keeping yes open after axial tactile stimulus.  Cranial Nerve: able to cross midline with eyes, difficult to examine otherwise due to limited exam participation and inability to keep eyes open  Motor: limited participation with exam but able to move BUE and BLE at least antigravity      Labs and Diagnostic Studies:    Personally reviewed   CBC - Results in Past 30 Days  Result Component Current Result Ref Range Previous Result Ref  Range   HCT 38.1 (L) (12/11/2024) 39.0 - 48.0 % 42.1 (12/10/2024) 39.0 - 48.0 %   HGB 13.1 (12/11/2024) 12.9 - 16.5 g/dL 85.5 (8/84/7973) 87.0 - 16.5 g/dL   MCH 67.3 (H) (8/83/7973) 25.9 - 32.4 pg 32.1 (12/10/2024) 25.9 - 32.4 pg   MCHC 34.5 (12/11/2024) 32.0 - 36.0 g/dL 65.8 (8/84/7973) 67.9 - 36.0 g/dL   MCV 05.6 (8/83/7973) 77.6 - 95.7 fL 94.3 (12/10/2024) 77.6 - 95.7 fL   MPV 8.3 (12/11/2024) 6.8 - 10.7 fL 9.1 (12/10/2024) 6.8 - 10.7 fL   Platelet 19 (L) (12/11/2024) 150 - 450 10*9/L 67 (L) (12/10/2024) 150 - 450 10*9/L   RBC 4.04 (L) (12/11/2024) 4.26 - 5.60 10*12/L 4.47 (12/10/2024) 4.26 - 5.60 10*12/L   WBC 4.6 (12/11/2024) 3.6 - 11.2 10*9/L 8.5 (12/10/2024) 3.6 - 11.2 10*9/L     BMP - Results in Past 30 Days  Result Component Current Result Ref Range Previous Result Ref Range   BUN 8 (L) (12/11/2024) 9 - 23 mg/dL 10 (8/84/7973) 9 - 23 mg/dL   Chloride, Whole Blood 111 (H) (12/11/2024) 98 - 107 mmol/L 110 (H) (12/11/2024) 98 - 107 mmol/L   CO2 26.0 (12/11/2024) 20.0 - 31.0 mmol/L 27.9 (12/10/2024) 20.0 - 31.0 mmol/L   Creatinine 0.85 (12/11/2024) 0.73 - 1.18 mg/dL 9.00 (8/84/7973) 9.26 - 1.18 mg/dL   Glucose 97 (8/83/7973) 70 - 179 mg/dL 98 (8/84/7973) 70 - 820 mg/dL   Potassium, Bld 3.8 (8/83/7973) 3.4 - 4.6 mmol/L 3.8 (12/11/2024) 3.4 - 4.8 mmol/L   Sodium Whole Blood 144 (12/11/2024) 135 - 145 mmol/L 143 (12/11/2024) 135 - 145 mmol/L     LFT's - Results in Past 30 Days  Result Component Current Result Ref Range Previous Result Ref Range   Albumin  3.8 (12/08/2024) 3.4 - 5.0 g/dL Not in Time Range    Alkaline Phosphatase 57 (12/08/2024) 46 - 116 U/L Not in Time Range    ALT 38 (12/08/2024) 10 - 49 U/L Not in Time Range    AST 50 (H) (12/08/2024) <=34 U/L Not in Time Range    Total Bilirubin 0.3 (12/08/2024) 0.3 - 1.2 mg/dL Not in Time Range        Radiology Results:      ECG 12 Lead  Result Date: 12/10/2024  NORMAL SINUS RHYTHM RIGHT ATRIAL ENLARGEMENT RIGHTWARD AXIS BORDERLINE ECG WHEN COMPARED WITH ECG OF 02-Feb-2024 19:33, QT HAS SHORTENED Confirmed by Antonetta Gull ([id number]) on 12/10/2024 1:50:50 PM    EEG video monitoring  Result Date: 12/10/2024  Table formatting from the original result was not included. FINAL LONGTERM VIDEO EEG MONITORING REPORT     Identifying Information NAME: Taber Sweetser  MRN: [id number] DOB: 03/29/1991  Ordering Provider:  Referred Self LOC: [id number]/5919-01     Study Information EEG Start: 12/10/24 at 0835 EEG End: 12/10/2024 at 1241     HISTORY: 34 y.o. male with history of epilepsy thought to be secondary to GAD 46 with  typical semiologies of 1) blinking, left gaze, left facial twitching, LUE flexion, LLE tonic-clonic to generalized. Post-ictal confusion up to 20 minutes and 2) left gaze, behavioral arrest with post-ictal confusional state often with attempt to remove clothes lasting about 10 minutes. He has had cough, nausea, vomiting, malaise with generalized weakness for the last few days      INDICATION:  Seizures     PATIENT STATE: Awake and Sleep     EEG TECHNICAL DESCRIPTION Conditions of Recording:  Continuous EEG with simultaneous video recording was performed utilizing 21 active  electrodes placed according to the international 10-20 system.  The study was recorded digitally with a bandpass of 1-70Hz  and a sampling rate of 200Hz  and was reviewed with the possibility of multiple reformatting.  The study was digitally processed with potential spike and seizure events identified for physician analysis and review.  Patient recognized events were identified by a push button marker and reviewed by the physician. Simultaneous video was reviewed for all patient events.     DAY 1 EEG DESCRIPTION: 12/10/24  at 0835 to 12/10/2024 at 1241 Marciana JAYSON Blake, MD) Relevant Medications: cenobamate  (Xcopri ) 200 mg daily, clonazepam  (Klonopin ) 1 mg in the morning and 2 mg in the evening, divalproex  (Depakote ) 1500 mg nightly, and lamotrigine  (Lamictal ) 200 mg twice daily     Background: The waking background showed retained organization. There was a slow posterior rhythm of 8 hz, which was symmetric and had reactivity. Anteriorly, there was more than expected 4-6 hz theta delta slowing.     Attenuation of the occipital rhythm accompanied drowsiness.  The sleep background was appropriately organized with well-developed spindles and vertex waves.  These sleep transients showed appropriate morphology and are bilaterally synchronous and symmetrical.  Interictal Epileptiform Activity: There were no epileptiform abnormalities or periodic patterns noted.     Ictal Activity: There were no ictal patterns noted.     Events: There were no clinical events.     EEG SUMMARY INTERPRETATION: This is an abnormal awake and asleep EEG due to: Diffuse slowing with retained reactivity and sleep states.     CLINICAL CORRELATION / SUMMARY of RECORDING: These findings are consistent with a non-specific mild degree of encephalopathy.    and There were no epileptiform discharges or seizures.     Interpreting Provider: Macario JAYSON Blake, MD     [id number] Seizure Risk Score Clinical risk score based on EEG findings and clinical history of seizures to aid in determination of optimal duration of EEG monitoring for detection of electrographic seizures. Score has not been validated in patients under age 32 or following cardiac arrest.     No Pertinent EEG findings - 0 points     Add 1 point if known history of epilepsy or prior clinical seizure.         Risk Group  Seizure Risk at 72 hours Duration of Monitoring  Seizure risk < 5%  Duration of Monitoring  Seizure risk < 2%      Low Risk, Score = 0  3.1% 1 Hour 3.3 Hours     Medium Risk, Score = 1  12% 12 Hours 29 Hours     High Risk, Score = 2 or greater             >25% >24 Hours >30 Hours Struck et al JAMA Neurology, January 2020     Not appropriate for EEG monitoring being performed for the following: Treatment of status epilepticus or seizures already documented on EEG Monitoring sedation/ burst suppression for management of intracranial pressure and/or paralyzed patients Diagnostic evaluation of transient episodes concerning for possible seizures (spell capture) Patients s/p cardiac arrest undergoing targeted temperature management      Amon dunker al. American Clinical Neurophysiology Society's Standardized Critical Care EEG Terminology: 2021 Version. Journal of Clinical Neurophysiology 38(1):p 1-29, January 2021.                                 ECG 12 Lead  Result Date: 12/10/2024  SINUS BRADYCARDIA WITH SINUS ARRHYTHMIA OTHERWISE NORMAL ECG WHEN COMPARED WITH ECG OF 08-Dec-2024 19:46, VENT. RATE HAS DECREASED by  39 bpm Confirmed by Antonetta Gull ([id number]) on 12/10/2024 1:17:08 PM    US  Abdomen Limited  Result Date: 12/10/2024  EXAM: US  ABDOMEN LIMITED ACCESSION: [id number] UN REPORT DATE: 12/09/2024 9:44 PM     CLINICAL INDICATION: 34 years old with RUQ, Gallbladder      COMPARISON: CT abdomen and pelvis 10/28/2022.     TECHNIQUE: Static and cine images of the right upper quadrant were performed.     FINDINGS:     LIVER: The liver was normal in echogenicity. No focal hepatic lesions. No biliary ductal dilatation.      Liver: 19.1 cm      Common bile duct: 0.2 cm     GALLBLADDER: The gallbladder was physiologically distended with small amount of internal sludge. Sonographic Beverley sign was unable to be assessed due to medication or patient condition.  No pericholecystic fluid. No gallbladder wall thickening.      Gallbladder wall: 0.3 cm     LIMITED RIGHT KIDNEY: No hydronephrosis.         1.  Small amount of gallbladder sludge. No evidence of acute cholecystitis. 2.  No biliary ductal dilatation.         Echocardiogram W Colorflow Spectral Doppler  Result Date: 12/09/2024  Patient Info Name:     Eythan Jayne Age:     3 years DOB:     04-Mar-1991 Gender:     Male MRN:     [id number] Accession #:     [id number] UN Account #: [id number] Ht:     185 cm Wt:     68 kg BSA:     1.87 m2 BP:     94 /     60 mmHg HR:     61 bpm Exam Date:     12/09/2024 11:30 AM Admit Date:     12/08/2024     Exam Type:     ECHOCARDIOGRAM W COLORFLOW SPECTRAL DOPPLER     Technical Quality:     Good     Staff Sonographer:     Norlene Harder Reading Fellow:     Annalee HERO Pistiolis Ordering Physician:     Rea KATHEE Caroline     Study Info Indications      - c/f cardiac dysfunction Procedure(s)   Complete two-dimensional, color flow and Doppler transthoracic echocardiogram is performed. Three dimensional echocardiographic imaging is performed during the transthoracic echocardiogram.         Summary   1. The left ventricular systolic function is normal, LVEF is visually estimated at 55%.   2. The right ventricle is normal in size, with normal systolic function.   3. IVC size and inspiratory change suggest mildly elevated right atrial pressure. (5-10 mmHg).         Left Ventricle   The left ventricle is normal in size with normal wall thickness. 3D echocardiography imaging was performed to better assess left ventricular contractile function. The concurrent supervision requirements have been met for 3D imaging. The left ventricular ejection fraction was quantified (3D) at 60 %. The left ventricular systolic function is normal, LVEF is visually estimated at 55%. There is normal left ventricular diastolic function.     Right Ventricle   The right ventricle is normal in size, with normal systolic function.         Left Atrium   The left atrium is normal  in size.     Right Atrium   The right atrium is normal in size.         Aortic Valve   The aortic valve is trileaflet with normal appearing leaflets with normal excursion. There is no significant aortic regurgitation. There is no evidence of a significant transvalvular gradient.     Mitral Valve   The mitral valve leaflets are normal with normal leaflet mobility. There is trivial mitral valve regurgitation.     Tricuspid Valve The tricuspid valve leaflets are normal, with normal leaflet mobility. There is trivial tricuspid regurgitation. The pulmonary systolic pressure cannot be estimated due to insufficient TR signal.     Pulmonic Valve   The pulmonic valve is normal. There is no evidence of a significant transvalvular gradient. There is trivial pulmonic regurgitation.         Aorta   The aorta is normal in size in the visualized segments.     Inferior Vena Cava   IVC size and inspiratory change suggest mildly elevated right atrial pressure. (5-10 mmHg).     Pericardium/Pleural   There is no pericardial effusion.     Other Findings   Rhythm: Sinus Rhythm.         Ventricles ---------------------------------------------------------------------- Name                                 Value        Normal ----------------------------------------------------------------------     LV Dimensions 2D/MM ----------------------------------------------------------------------  IVS Diastolic Thickness (2D)                                0.7 cm       0.6-1.0 LVID Diastole (2D)                  5.4 cm       4.2-5.8  LVPW Diastolic Thickness (2D)                                0.7 cm       0.6-1.0 LVID Systole (2D)                   3.9 cm       2.5-4.0 LVOT Diameter                       2.5 cm               LV Mass Index (2D Cubed)           70 g/m2        49-115  Relative Wall Thickness (2D)                                  0.26        <=0.42     RV Dimensions 2D/MM ----------------------------------------------------------------------  RV Basal Diastolic Dimension                           3.3 cm       2.5-4.1 TAPSE  2.0 cm         >=1.7     Atria ---------------------------------------------------------------------- Name                                 Value        Normal ----------------------------------------------------------------------     LA Dimensions ---------------------------------------------------------------------- LA Dimension (2D)                   3.9 cm       3.0-4.1 LA Volume Index (4C A-L)        26.27 ml/m2               LA Volume Index (2C A-L)        24.18 ml/m2                   RA Dimensions ---------------------------------------------------------------------- RA Area (4C)                      13.8 cm2        <=18.0 RA Area (4C) Index              7.4 cm2/m2               RA ESV Index (4C MOD)             18 ml/m2         18-32     Left Ventricular Outflow Tract ---------------------------------------------------------------------- Name                                 Value        Normal ----------------------------------------------------------------------     LVOT 2D ---------------------------------------------------------------------- LVOT Diameter                       2.5 cm               LVOT Area                          4.9 cm2                   LVOT Doppler ---------------------------------------------------------------------- LVOT Peak Velocity                 0.7 m/s     Mitral Valve ---------------------------------------------------------------------- Name                                 Value        Normal ----------------------------------------------------------------------     MV Diastolic Function ---------------------------------------------------------------------- MV E Peak Velocity                 65 cm/s               MV A Peak Velocity                 29 cm/s               MV E/A                                 2.3  MV Annular TDI ---------------------------------------------------------------------- MV Septal e' Velocity            11.6 cm/s         >=8.0 MV E/e' (Septal)                       5.6               MV Lateral e' Velocity           16.0 cm/s        >=10.0 MV E/e' (Lateral)                      4.1               MV e' Average                    13.8 cm/s               MV E/e' (Average) 4.8     Tricuspid Valve ---------------------------------------------------------------------- Name                                 Value        Normal ----------------------------------------------------------------------     Estimated PAP/RSVP ---------------------------------------------------------------------- RA Pressure                         8 mmHg           <=5     Pulmonic Valve ---------------------------------------------------------------------- Name                                 Value        Normal ----------------------------------------------------------------------     PV Doppler ---------------------------------------------------------------------- PV Peak Velocity                   0.8 m/s     Aorta ---------------------------------------------------------------------- Name                                 Value        Normal ----------------------------------------------------------------------     Ascending Aorta ---------------------------------------------------------------------- Ao Root Diameter (2D)               3.5 cm               Ao Root Diam Index (2D)          1.9 cm/m2     Venous ---------------------------------------------------------------------- Name                                 Value        Normal ----------------------------------------------------------------------     IVC/SVC ---------------------------------------------------------------------- IVC Diameter (Exp 2D)               2.2 cm         <=2.1         QLAB ---------------------------------------------------------------------- Name                                 Value        Normal ----------------------------------------------------------------------     Heart Model ----------------------------------------------------------------------  LV EF HM                              60 %         Report Signatures Finalized by Odell Debby Grew  MD on 12/09/2024 04:33 PM Resident Serafim M Pistiolis on 12/09/2024 01:45 PM    XR Chest Portable  Result Date: 12/09/2024  EXAM: XR CHEST PORTABLE ACCESSION: [id number] UN REPORT DATE: 12/09/2024 12:30 PM     CLINICAL INDICATION: CHEST PAIN      TECHNIQUE: Single View AP Chest Radiograph.     COMPARISON: 12/08/2024     FINDINGS:     Stable left retrocardiac opacity may reflect atelectasis/airspace disease. No new consolidation. No pneumothorax. No pleural effusion.     Normal heart size and mediastinal contours.             Stable chest             CT Head Wo Contrast  Result Date: 12/09/2024  EXAM: Computed tomography, head or brain without contrast material. ACCESSION: [id number] UN     CLINICAL INDICATION: 34 years old Male with Altered mental status      COMPARISON: 02/02/2024 CT and 10/27/2024 MRI     TECHNIQUE: Axial CT images of the head from skull base to vertex without contrast.     FINDINGS:     No acute hemorrhage, mass effect, or midline shift. Gray-white junction appears preserved.     Mild brain volume loss particularly of the cerebellum. No hydrocephalus. Basilar cisterns are patent. No significant extra-axial fluid collection.     Partially seen near complete opacification of right maxillary sinus with chronic reactive osteitis and atretic sinus. Minimal paranasal sinus mucosal thickening are noted elsewhere.     Visualized mastoid air cells and middle ears appear clear. External auditory canal cerumen are noted.     Osseous structures appear intact.             No acute intracranial process seen.     Overall appears relatively unchanged from prior examination.             XR Chest Portable  Result Date: 12/08/2024  EXAM: XR CHEST PORTABLE ACCESSION: [id number] UN REPORT DATE: 12/08/2024 10:24 PM     CLINICAL INDICATION: COUGH      TECHNIQUE: Single View AP Chest Radiograph.     COMPARISON: 02/02/2024     FINDINGS:     Cardiomediastinal silhouette is unchanged.     Left retrocardiac opacities. No pneumothorax or pleural effusion.             Mild left retrocardiac opacities. Atelectasis versus aspiration. Pneumonia cannot be excluded.                This encounter was an inpatient consultation.    MEDICAL DECISION MAKING (level of service defined by 2/3 elements)     Number/Complexity of Problems Addressed --HIGH (99205/99215)--  1 acute or chronic illness or injury that poses a threat to life or bodily function (99205/99215)   Amount/Complexity of Data to be Reviewed/Analyzed 3 points: Review prior notes (1 point per unique source); Review test results (1 point per unique test); Order tests (1 point per unique test); Assessment requiring an independent historian (99204/99214)  Discussion of management or test interpretation with external physician/other qualified health care professional/appropriate source (99204/99214)  --EXTENSIVE (99205/99215)--  Meets at least 2 of the 3 MODERATE criteria above (99205/99215)   Risk of  Complications/Morbidity/Mortality of Management --HIGH Risk of Morbidity from Additional Diagnostic Testing or Treatment (99205/99215)--     TIME     Total Time for E/M Services on the Date of Encounter Time-based coding not utilized for this encounter     Reviewed labs, internal medicine notes, neurology consult notes, PT/OT/dietician/CM notes, and discussed management with primary team.         [1]   Past Medical History:  Diagnosis Date    Fall     from medications    Hepatitis B surface antigen positive     Schizophrenia (CMS-HCC)     Seizure    (CMS-HCC)     TBI (traumatic brain injury) (CMS-HCC)     Vitamin D deficiency    [2]   Past Surgical History:  Procedure Laterality Date    MANDIBLE FRACTURE SURGERY     [3]   Social History  Tobacco Use    Smoking status: Every Day     Current packs/day: 1.00     Average packs/day: 1 pack/day for 10.0 years (10.0 ttl pk-yrs)     Types: Cigarettes    Smokeless tobacco: Never    Tobacco comments:     10-20cpd   Vaping Use    Vaping status: Never Used   Substance Use Topics    Alcohol use: Not Currently     Comment: rare    Drug use: Yes     Frequency: 7.0 times per week     Types: Marijuana, Cocaine   [4]    **Patient Supplied** tocilizumab  (ACTEMRA  ACTPEN) subcutaneous injection 162 mg Q7 Days    cefTRIAXone  (ROCEPHIN ) 1 g in sodium chloride  0.9 % (NS) 100 mL IVPB-MBP Q24H SCH    cetirizine  (ZYRTEC ) tablet 10 mg Q AM    clonazePAM  (KlonoPIN ) tablet 1 mg Daily    clonazePAM  (KlonoPIN ) tablet 2 mg Nightly    divalproex  ER (DEPAKOTE  ER) extended released 24 hr tablet 1,500 mg Nightly    [Provider Hold] enoxaparin  (LOVENOX ) syringe 40 mg Q24H    famotidine  (PF) (PEPCID ) injection 20 mg BID    folic acid  (FOLVITE ) tablet 1 mg Daily    [Provider Hold] haloperidol  (HALDOL ) tablet 15 mg Nightly    lamoTRIgine  (LaMICtal ) tablet 200 mg BID    levocarnitine  (CARNITOR ) (SF) oral solution BID    multivitamins, therapeutic with minerals tablet 1 tablet Daily    thiamine  (B-1) injection 200 mg Q8H [facility]

## 2024-12-11 NOTE — Progress Notes (Signed)
 MICU Daily Progress Note     Date of Service: 12/11/2024    Problem List:   Principal Problem:    Shock, unspecified    (CMS-HCC)  Active Problems:    Seizures    (CMS-HCC)    Septic shock    (CMS-HCC)    Upper respiratory infection, viral    Infection due to human metapneumovirus (hMPV)      Interval history: Austin Banks is a 34 y.o. 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 He was admitted to the MICU on 1/14 for hypotension and ongoing pressor requirements.       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 No witnessed seizure activity at home. Home anti-epileptics of Depakote , Xcopri , Lamictal , Tocilizumab , Klonopin , and Nayzilam  for rescue. Patient reports frequently missing doses of antiepileptic medications. Reports multiple falls without head strike or LOC. CT head negative for acute intracranial process. EEG without discharges, not concerning for seizure activity. Therapeutic on valproate.  Neurology service feels this level somnolence is baseline.  Klonopin  has tried to be weaned in the past with more seizure-like activity.  More alert this morning after holding nightly Haldol .  Regularly has 1-3 nonconvulsive seizures per week and at least 1 GTC every 1 to 3 months.  ISO heavy cocaine use, question whether this could be cocaine withdrawal.  Could be a reactivation, on tocilizumab , but query whether the patient is taking given his baseline functional status, living alone.  With ongoing viral infection, ADEM also remains on the differential. Family called, asked to bring in tocilizumab   - Brain MRI with and without  - Neurology consulted, signed off after no evidence of seizure on EEG  - Follow-up Lamictal  level  - Continue home antiepileptic regimen  - TSH low, fT4 1.12 (normal). Euthyroid sick  - RPR, HIV ordered     Hx polysubstance use  Nystagmus  Documented history of crack cocaine, tobacco, methamphetamine, and marijuana use. Urine tox in ED positive for amphetamines and cocaine. Negative ethanol on admission.  Presented with elevated AST. Smoked and snorted amphetamines prior to admission. Parents at bedside are aware of his substance use. Unclear how much etoh or if benzo/barb use present. Nystagmus present 1/15  - CIWA protocol     Dysmetria  Altered Mental Status  Patient has been lethargic, somnolent, and globally slow to respond since admission. His responsiveness has improved since this morning. Parents noted he was having difficulty eating with a fork, frequently bringing food to the wrong part of his face. C/f low vision. CT head 1/13 showed mild brain volume loss of the cerebellum, a new finding compared to prior imaging in 01/2024. Bedside cerebellar exam could not be completed due to lethargy, somnolence. Pupils are small but equal and reactive to light. Folate/b12 wnl. Previously taking thiamine  supplement, stopped earlier this year. Lower BMI, c/f chronic malnut.   - IV thiamine   - Neurology following, appreciate recs  - Continue to monitor with daily cerebellar, eye exam  - Serum ammonia wnl  - PT/OT  - hold nightly haloperidol         Analgesia: Pain adequately controlled  RASS at goal? N/A, not on sedation  [place] Agitation Assessment Scale (RASS) : -1 (12/11/2024  4:00 AM)       Pulmonary   Viral upper respiratory infection, Metapneumovirus +  Presented with 2-3 days of general malaise, fevers, and productive cough. On arrival to ED patient had refractory hypotension requiring pressors (see below) and leukocytosis with WBC 13. CXR appears to be consistent with viral infection, however read is unable to exclude pneumonia.  Given presentation, will treat for bacterial pneumonia with 5-day course ceftriaxone   -Ceftriaxone  (EOT 1/20)  - now on RA  - repeat VBG daily iso somnolence, c/f cerebellar dysfunction    Recent Labs     Units 12/11/24  0641   PHART  7.41   PCO2ART mm Hg 37.8   PO2ART mm Hg 95.7   HCO3ART mmol/L 24   BEART  -0.3   O2SATART % 97.9            Cardiovascular   Septic shock secondary to Metapneumovirus versus pneumonia  Presented with hypotension refractory to 2 L IV fluids.  Lactate peaked at 3.7, improved with fluids. Started on pressors.  My read on EKG is normal rate, sinus rhythm, right atrial abnormality, incomplete RBBB.  Additional liter LR received 1/16 2/2 increasing pressor requirements.  - remains on NE gtt, MAP goal >65  - See ID section for full infectious workup    Renal   AKI-resolved  Creatinine 2.13 at presentation (no history of prior renal dysfunction). Likely prerenal iso viral infection and multiple days of malaise and poor appetite. Given 2L IV fluids in the ED. UA with protein, improved Cr 0.85 (1.32).  CK not suggestive of ongoing rhabdo  - Daily BMP      Intake/Output Summary (Last 24 hours) at 12/11/2024 0833  Last data filed at 12/11/2024 0400  Gross per 24 hour   Intake 1675.03 ml   Output 1100 ml   Net 575.03 ml         Infectious Disease/Autoimmune   Septic Shock  Metapneumo upper respiratory viral infection  CAP  Presented with multiple days of weakness, fevers, cough, hypotension, and leukocytosis WBC 13. Hypotension refractory to 2L IV fluids, ultimately requiring initiation of pressors. Infectious workup started in the ED due to persist hypotension and history of immunocompromise (takes Tocilizumab  for epilepsy). RPP positive for metapneumovirus. CXR with concerns for atelectasis vs aspiration, pneumonia cannot be excluded. Empirically treated with Vanc, Zosyn  in ED. Bcx NGTD, negative MRSA screen. UA not concerning for infection.  RUQ ultrasound with small amount of gallbladder sludge, no evidence of cholecystitis or ductal dilatation.  Small juxta cardiac opacity on 1/14 CXR.  Will complete empiric treatment for bacterial pneumonia given improvement while on antibiotics  - STOP Vanc for empiric pneumonia coverage  - START CTX to complete 5d course (EOT 1/20)  - Pressors for hypotension as above (See Cardio section)  - Consider infectious disease consult given severity of septic shock and history of immunosuppression with Tocilizumab     Cultures:  Blood Culture, Routine (no units)   Date Value   12/08/2024 No Growth at 48 hours   12/08/2024 No Growth at 48 hours     WBC (10*9/L)   Date Value   12/11/2024 4.6     WBC, UA (/HPF)   Date Value   12/09/2024 <1            FEN/GI   NAI    Provider Malnutrition Assessment:  Body mass index is 19.92  kg/m??.BMI Interpretation: within normal limits.  GLIM criteria:   Pt with severe muscle loss (e.g., severe temporal wasting) -- severe malnutrition  -I have screened this patient for malnutrition and they did meet criteria for malnutrition based on GLIM criteria.  -Nutrition consulted yes, see RD assessment below  RD assessment:Not done yet.           Heme/Coag   Thrombocytopenia  Platelets 66. No prior recorded episodes of thrombocytopenia. LFT significant for mildly elevated AST. Low concern for active hemorrhage. Consider ITP iso recent viral infection; diagnosis of exclusion. B12, Folate wnl  - repeat CBC, smear, DIC panel  - Daily CBC,   - Peripheral smear    Endocrine   NAI    Integumentary     #  - WOCN consulted for high risk skin assessment No. Reason: n/a.  - WOCN recs >> n/a  - cont pressure mitigating precautions per skin policy    Prophylaxis/LDA/Restraints/Consults   ICU Checklist completed:  see ICU rounding navigator      Patient Lines/Drains/Airways Status       Active Active Lines, Drains, & Airways       Name Placement date Placement time Site Days    Peripheral IV 12/08/24 Right Antecubital 12/08/24  1955  Antecubital  2    Peripheral IV 12/08/24 Anterior;Left Forearm 12/08/24  2154  Forearm  2    Arterial Line 12/10/24 Left Radial 12/10/24  1245  Radial  less than 1                  Patient Lines/Drains/Airways Status       Active Wounds       None                    Goals of Care     Code Status:   Orders Placed This Encounter   Procedures    Full Code     Standing Status:   Standing     Number of Occurrences:   1        Librarian, Academic:  Mr. Shaff designated healthcare decision maker(s) is/are   HCDM (patient stated preference): Carr, Shartzer - Father - [phone]    HCDM (patient stated preference): Ivy, Meriwether - Mother - [phone]. See HCDM section of Epic sidebar/storyboard or ACP tab in patient chart for details regarding active HCDMs and patient capacity for decision-making.      Subjective     Feels more awake today, better than baseline he reports.  States he is fairly independent at baseline, ambulating.  Wondering when he can go home.  Understands we are waiting safe disposition plan    Objective     Vitals - past 24 hours  Temp:  [36.6 ??C (97.8 ??F)-36.7 ??C (98 ??F)] 36.6 ??C (97.9 ??F)  Pulse:  [47-73] 52  SpO2 Pulse:  [48-73] 51  Resp:  [16-24] 20  BP: (82-110)/(43-76) 92/57  SpO2:  [95 %-100 %] 98 % Intake/Output  I/O last 3 completed shifts:  In: 3245.6 [P.O.:360; I.V.:231.3; IV Piggyback:2654.3]  Out: 1525 [Urine:1525]     Physical Exam:    General: Lying in bed, no acute distress.more interactive   HEENT: Normocephalic, atraumatic. Moist mucous membranes.  CV: Regular rate and rhythm. No murmurs auscultated.  Pulm: Diffuse coarse breath sounds throughout lung fields.  GI: Non-distended, non-tender to palpation.  Skin: No rashes or wounds appreciated on clothed exam.  Neuro: Bradykinesia, able to hold conversation  Continuous Infusions:   Infusions Meds[1]    Scheduled Medications:   Scheduled Medications[2]    PRN medications:  PRN Medications[3]    Data/Imaging Review: Reviewed in Epic and personally interpreted on 12/11/2024. See EMR for detailed results.      Artist Ill, MD  PGY1 Internal Medicine                 [1]    NORepinephrine  bitartrate-NS 4 mcg/min (12/11/24 0355)   [2]    cenobamate   200 mg Oral Daily    cetirizine   10 mg Oral Q AM    clonazePAM   1 mg Oral Daily    clonazePAM   2 mg Oral Nightly    divalproex  ER  1,500 mg Oral Nightly    enoxaparin  (LOVENOX ) injection  40 mg Subcutaneous Q24H    famotidine  (PEPCID ) IV  20 mg Intravenous BID    folic acid   1 mg Oral Daily    [Provider Hold] haloperidol   15 mg Oral Nightly    lamoTRIgine   200 mg Oral BID    levocarnitine  (SF)  500 mg Oral BID    multivitamins (ADULT)  1 tablet Oral Daily    piperacillin -tazobactam (ZOSYN ) IV (intermittent)  4.5 g Intravenous Q6H    thiamine   200 mg Intravenous Q8H SCH   [3] acetaminophen , aluminum-magnesium  hydroxide-simethicone, guaiFENesin , melatonin, OLANZapine zydis, senna

## 2024-12-11 NOTE — Consults (Signed)
 Additional order received and the patient is already on the caseload. Continue with plan of care.
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
## 2024-12-11 NOTE — Plan of Care (Signed)
 RASS -1, follows commands. Denies Pain. Room air. SB/NSR on/off pressors while asleep. X1 BM. Tolerating regular diet. Voiding via urinal. CHG bath given. [employer] with PT.   Problem: Adult Inpatient Plan of Care  Goal: Absence of Hospital-Acquired Illness or Injury  Intervention: Prevent Skin Injury  Recent Flowsheet Documentation  Taken 12/11/2024 1800 by Janae Arthea LABOR, RN  Positioning for Skin: Left  Taken 12/11/2024 1600 by Janae Arthea LABOR, RN  Positioning for Skin: Right  Taken 12/11/2024 1400 by Janae Arthea LABOR, RN  Positioning for Skin: Left  Taken 12/11/2024 1200 by Janae Arthea LABOR, RN  Positioning for Skin: Right  Taken 12/11/2024 1000 by Janae Arthea LABOR, RN  Positioning for Skin: Standing  Taken 12/11/2024 0800 by Janae Arthea LABOR, RN  Positioning for Skin: Left

## 2024-12-11 NOTE — Consults (Signed)
 Heparin -Induced Thrombocytopenia Initial Pharmacy Note    Austin Banks is a 34 y.o. male being tested for HIT or HITT.    4Ts Score Assessment   (see Table 1 of Management of Heparin -Induced Thrombocytopenia Guideline)    Clinical Scoring Item:  Points:   Thrombocytopenia: 1   Timing of platelet count fall:  0   Thrombosis or other sequelae: 0   OTher causes of thrombocytopenia:  1   Composite Score:  2   HIT Risk Based on 4T Score:  low (0-3 points)       Pertinent Medications  Active heparin -containing products at the time of testing: lovenox    Active antiplatelet medications: none    Pertinent Laboratory Values  Lab Results   Component Value Date    CREATININE 0.85 12/11/2024     Lab Results   Component Value Date    WBC 4.6 12/11/2024    RBC 4.04 (L) 12/11/2024    HGB 13.1 12/11/2024    HCT 38.1 (L) 12/11/2024    MCV 94.3 12/11/2024    MCH 32.6 (H) 12/11/2024    MCHC 34.5 12/11/2024    RDW 13.3 12/11/2024    PLT 19 (L) 12/11/2024    MPV 8.3 12/11/2024     Lab Results   Component Value Date    INR 1.07 12/09/2024       Assessment/Plan  1. The following tests have been ordered based on total 4Ts score: HIT PF4 antibody, given low 4T score  2. Recommend to hold lovenox  DVT prophylaxis.  3.  Medication profile was screened for heparin -containing products and it is recommended to discontinue the following products: lovenox    4.  Pending heparin  allergy was documented in Epic on 12/11/2024.  Allergy information will be updated once laboratory testing is finalized.   5. Heparin -induced thrombocytopenia education will be provided prior to discharge by pharmacy or nursing staff if diagnosis is confirmed with laboratory testing.     Please contact service pharmacist with questions/clarifications.     NYLE MATTOCK, PharmD, BCCCP

## 2024-12-12 LAB — BLOOD GAS CRITICAL CARE PANEL, ARTERIAL
BASE EXCESS ARTERIAL: 2.2 — ABNORMAL HIGH (ref -2.0–2.0)
BASE EXCESS ARTERIAL: 2.2 — ABNORMAL HIGH (ref -2.0–2.0)
CALCIUM IONIZED ARTERIAL (MG/DL): 4.46 mg/dL (ref 4.40–5.40)
CALCIUM IONIZED ARTERIAL (MG/DL): 4.72 mg/dL (ref 4.40–5.40)
CARBOXYHEMOGLOBIN: <1 % (ref <1.2)
CARBOXYHEMOGLOBIN: 1.4 % — ABNORMAL HIGH (ref <1.2)
CHLORIDE, WHOLE BLOOD: 110 mmol/L — ABNORMAL HIGH (ref 98–107)
GLUCOSE WHOLE BLOOD: 84 mg/dL (ref 70–179)
GLUCOSE WHOLE BLOOD: 97 mg/dL (ref 70–179)
HCO3 ARTERIAL: 27 mmol/L (ref 22–27)
HCO3 ARTERIAL: 26 mmol/L (ref 22–27)
HEMOGLOBIN BLOOD GAS: 13.1 g/dL — ABNORMAL LOW (ref 13.50–17.50)
HEMOGLOBIN BLOOD GAS: 13.7 g/dL (ref 13.50–17.50)
LACTATE BLOOD ARTERIAL: 0.9 mmol/L (ref <1.3)
LACTATE BLOOD ARTERIAL: 1.1 mmol/L (ref <1.3)
METHEMOGLOBIN: <1 % (ref <1.5)
METHEMOGLOBIN: <1 % (ref <1.5)
O2 SATURATION ARTERIAL: 98.3 % (ref 94.0–100.0)
O2 SATURATION ARTERIAL: 97.8 % (ref 94.0–100.0)
OXYHEMOGLOBIN: 97 % (ref 94.0–100.0)
OXYHEMOGLOBIN: 95.9 % (ref 94.0–100.0)
PCO2 ARTERIAL: 39.8 mmHg (ref 35.0–45.0)
PCO2 ARTERIAL: 38.4 mmHg (ref 35.0–45.0)
PH ARTERIAL: 7.43 (ref 7.35–7.45)
PH ARTERIAL: 7.45 (ref 7.35–7.45)
PO2 ARTERIAL: 109 mmHg (ref 80.0–110.0)
PO2 ARTERIAL: 95.2 mmHg (ref 80.0–110.0)
POTASSIUM WHOLE BLOOD: 4 mmol/L (ref 3.4–4.6)
POTASSIUM WHOLE BLOOD: 4.2 mmol/L (ref 3.4–4.6)
SODIUM WHOLE BLOOD: 141 mmol/L (ref 135–145)
SODIUM WHOLE BLOOD: 138 mmol/L (ref 135–145)

## 2024-12-12 LAB — SLIDE REVIEW

## 2024-12-12 LAB — CBC W/ AUTO DIFF
BASOPHILS ABSOLUTE COUNT: 0 10*9/L (ref 0.0–0.1)
BASOPHILS RELATIVE PERCENT: 0.7 %
EOSINOPHILS ABSOLUTE COUNT: 0 10*9/L (ref 0.0–0.5)
EOSINOPHILS RELATIVE PERCENT: 1.1 %
HEMATOCRIT: 40.4 % (ref 39.0–48.0)
HEMOGLOBIN: 13.7 g/dL (ref 12.9–16.5)
LYMPHOCYTES ABSOLUTE COUNT: 2.2 10*9/L (ref 1.1–3.6)
LYMPHOCYTES RELATIVE PERCENT: 52.9 %
MEAN CORPUSCULAR HEMOGLOBIN CONC: 33.9 g/dL (ref 32.0–36.0)
MEAN CORPUSCULAR HEMOGLOBIN: 31.6 pg (ref 25.9–32.4)
MEAN CORPUSCULAR VOLUME: 93.4 fL (ref 77.6–95.7)
MEAN PLATELET VOLUME: 10.7 fL (ref 6.8–10.7)
MONOCYTES ABSOLUTE COUNT: 0.9 10*9/L — ABNORMAL HIGH (ref 0.3–0.8)
MONOCYTES RELATIVE PERCENT: 21.2 %
NEUTROPHILS ABSOLUTE COUNT: 1 10*9/L — ABNORMAL LOW (ref 1.8–7.8)
NEUTROPHILS RELATIVE PERCENT: 24.1 %
PLATELET COUNT: 40 10*9/L — ABNORMAL LOW (ref 150–450)
RED BLOOD CELL COUNT: 4.33 10*12/L (ref 4.26–5.60)
RED CELL DISTRIBUTION WIDTH: 13.3 % (ref 12.2–15.2)
WBC ADJUSTED: 4.2 10*9/L (ref 3.6–11.2)

## 2024-12-12 LAB — BASIC METABOLIC PANEL
ANION GAP: 11 mmol/L (ref 5–14)
BLOOD UREA NITROGEN: 9 mg/dL (ref 9–23)
BUN / CREAT RATIO: 9
CALCIUM: 8.4 mg/dL — ABNORMAL LOW (ref 8.7–10.4)
CHLORIDE: 106 mmol/L (ref 98–107)
CO2: 28 mmol/L (ref 20.0–31.0)
CREATININE: 1 mg/dL (ref 0.73–1.18)
EGFR CKD-EPI (2021) MALE: >90 mL/min/1.73m2 (ref >=60)
GLUCOSE RANDOM: 81 mg/dL (ref 70–99)
POTASSIUM: 4.4 mmol/L (ref 3.4–4.8)
SODIUM: 145 mmol/L (ref 135–145)

## 2024-12-12 LAB — C-REACTIVE PROTEIN: C-REACTIVE PROTEIN: 13 mg/L — ABNORMAL HIGH (ref <=10.0)

## 2024-12-12 LAB — VALPROIC ACID, FREE, SERUM: VALPROIC ACID, FREE, S: 12 ug/mL

## 2024-12-12 LAB — LACTATE, VENOUS, WHOLE BLOOD: LACTATE BLOOD VENOUS: 1.4 mmol/L (ref 0.5–1.8)

## 2024-12-12 LAB — SEDIMENTATION RATE: ERYTHROCYTE SEDIMENTATION RATE: <1 mm/h (ref 0–15)

## 2024-12-12 LAB — PERIPHERAL BLOOD SMEAR, PATH REVIEW

## 2024-12-12 MED ADMIN — levocarnitine (CARNITOR) (SF) oral solution: 500 mg | ORAL | @ 14:00:00

## 2024-12-12 MED ADMIN — levocarnitine (CARNITOR) (SF) oral solution: 500 mg | ORAL | @ 02:00:00

## 2024-12-12 MED ADMIN — clonazePAM (KlonoPIN) tablet 1 mg: 1 mg | ORAL | @ 14:00:00

## 2024-12-12 MED ADMIN — cetirizine (ZYRTEC) tablet 10 mg: 10 mg | ORAL | @ 14:00:00

## 2024-12-12 MED ADMIN — folic acid (FOLVITE) tablet 1 mg: 1 mg | ORAL | @ 14:00:00

## 2024-12-12 MED ADMIN — cenobamate (XCOPRI) tablet 400 mg: 400 mg | ORAL | @ 14:00:00 | NDC 71699005099

## 2024-12-12 MED ADMIN — lamoTRIgine (LaMICtal) tablet 200 mg: 200 mg | ORAL | @ 14:00:00

## 2024-12-12 MED ADMIN — lamoTRIgine (LaMICtal) tablet 200 mg: 200 mg | ORAL | @ 02:00:00

## 2024-12-12 MED ADMIN — famotidine (PF) (PEPCID) injection 20 mg: 20 mg | INTRAVENOUS | @ 14:00:00

## 2024-12-12 MED ADMIN — famotidine (PF) (PEPCID) injection 20 mg: 20 mg | INTRAVENOUS | @ 02:00:00

## 2024-12-12 MED ADMIN — clonazePAM (KlonoPIN) tablet 2 mg: 2 mg | ORAL | @ 07:00:00

## 2024-12-12 MED ADMIN — multivitamins, therapeutic with minerals tablet 1 tablet: 1 | ORAL | @ 14:00:00

## 2024-12-12 MED ADMIN — thiamine (B-1) injection 200 mg: 200 mg | INTRAVENOUS | @ 11:00:00 | Stop: 2024-12-15

## 2024-12-12 MED ADMIN — thiamine (B-1) injection 200 mg: 200 mg | INTRAVENOUS | @ 02:00:00 | Stop: 2024-12-15

## 2024-12-12 MED ADMIN — thiamine (B-1) injection 200 mg: 200 mg | INTRAVENOUS | @ 19:00:00 | Stop: 2024-12-15

## 2024-12-12 MED ADMIN — cefTRIAXone (ROCEPHIN) 1 g in sodium chloride 0.9 % (NS) 100 mL IVPB-MBP: 1 g | INTRAVENOUS | @ 14:00:00 | Stop: 2024-12-15

## 2024-12-12 MED ADMIN — gadopiclenol (ELUCIREM,VUEWAY) injection 6.8 mL: 6.8 mL | INTRAVENOUS | @ 19:00:00 | Stop: 2024-12-12

## 2024-12-12 MED ADMIN — divalproex ER (DEPAKOTE ER) extended released 24 hr tablet 1,500 mg: 1500 mg | ORAL | @ 02:00:00

## 2024-12-12 MED ADMIN — midodrine (PROAMATINE) tablet 10 mg: 10 mg | ORAL | @ 11:00:00 | Stop: 2024-12-12

## 2024-12-12 MED ADMIN — midodrine (PROAMATINE) tablet 10 mg: 10 mg | ORAL | @ 02:00:00

## 2024-12-12 MED ADMIN — midodrine (PROAMATINE) tablet 10 mg: 10 mg | ORAL | @ 19:00:00 | Stop: 2024-12-12

## 2024-12-12 MED ADMIN — lactated ringers bolus 1,000 mL: 1000 mL | INTRAVENOUS | @ 15:00:00 | Stop: 2024-12-12

## 2024-12-12 MED ADMIN — NORepinephrine 8 mg in dextrose 5 % 250 mL (32 mcg/mL) infusion PMB: 0-30 ug/min | INTRAVENOUS | @ 02:00:00
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
## 2024-12-12 NOTE — Plan of Care (Signed)
 Shift Summary  NORepinephrine  bitartrate-D5W was administered multiple times during the shift, with one dose of midodrine  given.   SpO2 values decreased significantly after 10:15 PM and remained low for much of the shift, with respiratory rate fluctuating.   Bed and chair alarms, aspiration precautions, and frequent skin interventions were maintained to support safety and skin integrity.   IV sites and dressings remained clean, dry, and intact, with no interventions needed.   Overall, comfort was maintained, and no acute hospital-acquired complications were documented.     Absence of Hospital-Acquired Illness or Injury: Frequent repositioning, absorbent pad changes, and silicone foam dressing use were maintained throughout the shift; IV sites and dressings remained clean, dry, and intact, with no interventions needed. Bed alarms and aspiration precautions were consistently in place, and no falls or device-related complications were documented.     Optimal Comfort and Wellbeing: Pain remained at 0 throughout the shift, and mood was consistently calm and cooperative; no comfort interventions were required.     Readiness for Transition of Care: Unplanned readmission score showed a slight increase over the shift, and ability to express feelings and make self understood remained stable, but drowsiness and impulsivity persisted.     Absence of Infection Signs and Symptoms: Perineal care and hand hygiene were performed regularly, and IV sites were clean and dry; no documentation of fever or new drainage.

## 2024-12-13 LAB — BASIC METABOLIC PANEL
ANION GAP: 4 mmol/L — ABNORMAL LOW (ref 5–14)
BLOOD UREA NITROGEN: 9 mg/dL (ref 9–23)
BUN / CREAT RATIO: 10
CALCIUM: 8.2 mg/dL — ABNORMAL LOW (ref 8.7–10.4)
CHLORIDE: 110 mmol/L — ABNORMAL HIGH (ref 98–107)
CO2: 26 mmol/L (ref 20.0–31.0)
CREATININE: 0.91 mg/dL (ref 0.73–1.18)
EGFR CKD-EPI (2021) MALE: >90 mL/min/1.73m2 (ref >=60)
GLUCOSE RANDOM: 87 mg/dL (ref 70–179)
POTASSIUM: 4.1 mmol/L (ref 3.4–4.8)
SODIUM: 140 mmol/L (ref 135–145)

## 2024-12-13 LAB — BLOOD GAS CRITICAL CARE PANEL, ARTERIAL
BASE EXCESS ARTERIAL: 0.6 (ref -2.0–2.0)
CALCIUM IONIZED ARTERIAL (MG/DL): 4.59 mg/dL (ref 4.40–5.40)
CARBOXYHEMOGLOBIN: 1.2 % — ABNORMAL HIGH (ref <1.2)
CHLORIDE, WHOLE BLOOD: 114 mmol/L — ABNORMAL HIGH (ref 98–107)
GLUCOSE WHOLE BLOOD: 92 mg/dL (ref 70–179)
HCO3 ARTERIAL: 25 mmol/L (ref 22–27)
HEMOGLOBIN BLOOD GAS: 12.9 g/dL — ABNORMAL LOW (ref 13.50–17.50)
LACTATE BLOOD ARTERIAL: 0.9 mmol/L (ref <1.3)
METHEMOGLOBIN: <1 % (ref <1.5)
O2 SATURATION ARTERIAL: 98.7 % (ref 94.0–100.0)
OXYHEMOGLOBIN: 96.9 % (ref 94.0–100.0)
PCO2 ARTERIAL: 37.8 mmHg (ref 35.0–45.0)
PH ARTERIAL: 7.43 (ref 7.35–7.45)
PO2 ARTERIAL: 118 mmHg — ABNORMAL HIGH (ref 80.0–110.0)
POTASSIUM WHOLE BLOOD: 3.9 mmol/L (ref 3.4–4.6)
SODIUM WHOLE BLOOD: 138 mmol/L (ref 135–145)

## 2024-12-13 LAB — HEPATITIS PANEL, ACUTE
HCV S/CO VALUE: 0.24
HEPATITIS A IGM ANTIBODY: NONREACTIVE
HEPATITIS B CORE IGM ANTIBODY: NONREACTIVE
HEPATITIS B SURFACE ANTIGEN: NONREACTIVE
HEPATITIS C ANTIBODY: NONREACTIVE

## 2024-12-13 LAB — HIV ANTIGEN/ANTIBODY COMBO: HIV ANTIGEN/ANTIBODY COMBO: NONREACTIVE

## 2024-12-13 LAB — CBC
HEMATOCRIT: 38.5 % — ABNORMAL LOW (ref 39.0–48.0)
HEMOGLOBIN: 12.8 g/dL — ABNORMAL LOW (ref 12.9–16.5)
MEAN CORPUSCULAR HEMOGLOBIN CONC: 33.2 g/dL (ref 32.0–36.0)
MEAN CORPUSCULAR HEMOGLOBIN: 31.9 pg (ref 25.9–32.4)
MEAN CORPUSCULAR VOLUME: 96 fL — ABNORMAL HIGH (ref 77.6–95.7)
MEAN PLATELET VOLUME: 8.8 fL (ref 6.8–10.7)
PLATELET COUNT: 66 10*9/L — ABNORMAL LOW (ref 150–450)
RED BLOOD CELL COUNT: 4.01 10*12/L — ABNORMAL LOW (ref 4.26–5.60)
RED CELL DISTRIBUTION WIDTH: 13.1 % (ref 12.2–15.2)
WBC ADJUSTED: 4.3 10*9/L (ref 3.6–11.2)

## 2024-12-13 LAB — MAGNESIUM: MAGNESIUM: 1.9 mg/dL (ref 1.6–2.6)

## 2024-12-13 MED ADMIN — levocarnitine (CARNITOR) (SF) oral solution: 500 mg | ORAL | @ 04:00:00

## 2024-12-13 MED ADMIN — levocarnitine (CARNITOR) (SF) oral solution: 500 mg | ORAL | @ 14:00:00

## 2024-12-13 MED ADMIN — midodrine (PROAMATINE) tablet 10 mg: 10 mg | ORAL | @ 21:00:00

## 2024-12-13 MED ADMIN — clonazePAM (KlonoPIN) tablet 1 mg: 1 mg | ORAL | @ 13:00:00

## 2024-12-13 MED ADMIN — cetirizine (ZYRTEC) tablet 10 mg: 10 mg | ORAL | @ 13:00:00

## 2024-12-13 MED ADMIN — folic acid (FOLVITE) tablet 1 mg: 1 mg | ORAL | @ 13:00:00

## 2024-12-13 MED ADMIN — cenobamate (XCOPRI) tablet 400 mg: 400 mg | ORAL | @ 13:00:00 | NDC 71699005099

## 2024-12-13 MED ADMIN — lamoTRIgine (LaMICtal) tablet 200 mg: 200 mg | ORAL | @ 02:00:00

## 2024-12-13 MED ADMIN — lamoTRIgine (LaMICtal) tablet 200 mg: 200 mg | ORAL | @ 13:00:00

## 2024-12-13 MED ADMIN — famotidine (PF) (PEPCID) injection 20 mg: 20 mg | INTRAVENOUS | @ 13:00:00

## 2024-12-13 MED ADMIN — famotidine (PF) (PEPCID) injection 20 mg: 20 mg | INTRAVENOUS | @ 02:00:00

## 2024-12-13 MED ADMIN — clonazePAM (KlonoPIN) tablet 2 mg: 2 mg | ORAL | @ 02:00:00

## 2024-12-13 MED ADMIN — multivitamins, therapeutic with minerals tablet 1 tablet: 1 | ORAL | @ 13:00:00

## 2024-12-13 MED ADMIN — thiamine (B-1) injection 200 mg: 200 mg | INTRAVENOUS | @ 21:00:00 | Stop: 2024-12-15

## 2024-12-13 MED ADMIN — thiamine (B-1) injection 200 mg: 200 mg | INTRAVENOUS | @ 10:00:00 | Stop: 2024-12-15

## 2024-12-13 MED ADMIN — thiamine (B-1) injection 200 mg: 200 mg | INTRAVENOUS | @ 04:00:00 | Stop: 2024-12-15

## 2024-12-13 MED ADMIN — midodrine (PROAMATINE) tablet 15 mg: 15 mg | ORAL | @ 11:00:00 | Stop: 2024-12-13

## 2024-12-13 MED ADMIN — midodrine (PROAMATINE) tablet 15 mg: 15 mg | ORAL | @ 04:00:00

## 2024-12-13 MED ADMIN — cefTRIAXone (ROCEPHIN) 1 g in sodium chloride 0.9 % (NS) 100 mL IVPB-MBP: 1 g | INTRAVENOUS | @ 13:00:00 | Stop: 2024-12-15

## 2024-12-13 MED ADMIN — divalproex ER (DEPAKOTE ER) extended released 24 hr tablet 1,500 mg: 1500 mg | ORAL | @ 02:00:00

## 2024-12-13 MED ADMIN — ipratropium-albuterol (DUO-NEB) 0.5-2.5 mg/3 mL nebulizer solution 3 mL: 3 mL | RESPIRATORY_TRACT | @ 20:00:00 | Stop: 2024-12-13

## 2024-12-13 NOTE — Plan of Care (Signed)
 Pt RASS -2 to RASS +1, waxing/waning orientation & drowsiness, MAP >60 when awake, > 55 while sleeping, O2 Sats >90% on RA. Pt SB.  Afebrile. No c/o pain. Voids using urinal. UOP diminished. Last BM 12/12/2024. Pt's appetite has been inadequate. Skin intact. Pt frequently repositions self; heels elevated off bed. Contact/Droplet precautions maintained. All monitors with appropriate alarm settings. Please see MAR and flowsheet for more detail.      Problem: Adult Inpatient Plan of Care  Goal: Absence of Hospital-Acquired Illness or Injury  Intervention: Identify and Manage Fall Risk  Recent Flowsheet Documentation  Taken 12/12/2024 2000 by Rosana Damien RAMAN, RN  Safety Interventions:   environmental modification   fall reduction program maintained   lighting adjusted for tasks/safety   low bed   isolation precautions  Intervention: Prevent Skin Injury  Recent Flowsheet Documentation  Taken 12/13/2024 0400 by Rosana Damien RAMAN, RN  Positioning for Skin: Supine/Back  Taken 12/13/2024 0200 by Rosana Damien RAMAN, RN  Positioning for Skin: Right  Taken 12/13/2024 0000 by Rosana Damien RAMAN, RN  Positioning for Skin: Left  Taken 12/12/2024 2200 by Rosana Damien RAMAN, RN  Positioning for Skin: Right  Taken 12/12/2024 2000 by Rosana Damien RAMAN, RN  Positioning for Skin: Left  Device Skin Pressure Protection:   absorbent pad utilized/changed   adhesive use limited   positioning supports utilized   pressure points protected   skin-to-device areas padded   skin-to-skin areas padded   tubing/devices free from skin contact  Skin Protection:   adhesive use limited   incontinence pads utilized   skin-to-device areas padded   skin-to-skin areas padded   transparent dressing maintained   tubing/devices free from skin contact  Intervention: Prevent and Manage VTE (Venous Thromboembolism) Risk  Recent Flowsheet Documentation  Taken 12/13/2024 0600 by Rosana Damien RAMAN, RN  Anti-Embolism Device Type: SCD, Knee  Anti-Embolism Device Status: Off  Anti-Embolism Device Location: BLE  Taken 12/13/2024 0400 by Rosana Damien RAMAN, RN  Anti-Embolism Device Type: SCD, Knee  Anti-Embolism Device Status: Off  Anti-Embolism Device Location: BLE  Taken 12/13/2024 0200 by Rosana Damien RAMAN, RN  Anti-Embolism Device Type: SCD, Knee  Anti-Embolism Device Status: Off  Anti-Embolism Device Location: BLE  Taken 12/13/2024 0000 by Rosana Damien RAMAN, RN  Anti-Embolism Device Type: SCD, Knee  Anti-Embolism Device Status: Off  Anti-Embolism Device Location: BLE  Taken 12/12/2024 2200 by Rosana Damien RAMAN, RN  Anti-Embolism Device Type: SCD, Knee  Anti-Embolism Device Status: Off  Anti-Embolism Device Location: BLE  Taken 12/12/2024 2000 by Rosana Damien RAMAN, RN  Anti-Embolism Device Type: SCD, Knee  Anti-Embolism Device Status: Off  Anti-Embolism Device Location: BLE  Intervention: Prevent Infection  Recent Flowsheet Documentation  Taken 12/12/2024 2000 by Rosana Damien RAMAN, RN  Infection Prevention:   environmental surveillance performed   equipment surfaces disinfected   hand hygiene promoted   personal protective equipment utilized   rest/sleep promoted   single patient room provided   visitors restricted/screened
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
## 2024-12-14 LAB — CBC
HEMATOCRIT: 38.9 % — ABNORMAL LOW (ref 39.0–48.0)
HEMOGLOBIN: 13.6 g/dL (ref 12.9–16.5)
MEAN CORPUSCULAR HEMOGLOBIN CONC: 34.8 g/dL (ref 32.0–36.0)
MEAN CORPUSCULAR HEMOGLOBIN: 32.6 pg — ABNORMAL HIGH (ref 25.9–32.4)
MEAN CORPUSCULAR VOLUME: 93.4 fL (ref 77.6–95.7)
MEAN PLATELET VOLUME: 9.7 fL (ref 6.8–10.7)
PLATELET COUNT: 110 10*9/L — ABNORMAL LOW (ref 150–450)
RED BLOOD CELL COUNT: 4.16 10*12/L — ABNORMAL LOW (ref 4.26–5.60)
RED CELL DISTRIBUTION WIDTH: 13.2 % (ref 12.2–15.2)
WBC ADJUSTED: 5 10*9/L (ref 3.6–11.2)

## 2024-12-14 LAB — BASIC METABOLIC PANEL
ANION GAP: 6 mmol/L (ref 5–14)
BLOOD UREA NITROGEN: 7 mg/dL — ABNORMAL LOW (ref 9–23)
BUN / CREAT RATIO: 8
CALCIUM: 8.2 mg/dL — ABNORMAL LOW (ref 8.7–10.4)
CHLORIDE: 109 mmol/L — ABNORMAL HIGH (ref 98–107)
CO2: 24 mmol/L (ref 20.0–31.0)
CREATININE: 0.85 mg/dL (ref 0.73–1.18)
EGFR CKD-EPI (2021) MALE: >90 mL/min/1.73m2 (ref >=60)
GLUCOSE RANDOM: 85 mg/dL (ref 70–179)
POTASSIUM: 4.4 mmol/L (ref 3.4–4.8)
SODIUM: 139 mmol/L (ref 135–145)

## 2024-12-14 LAB — MAGNESIUM: MAGNESIUM: 1.9 mg/dL (ref 1.6–2.6)

## 2024-12-14 MED ADMIN — levocarnitine (CARNITOR) (SF) oral solution: 500 mg | ORAL | @ 02:00:00

## 2024-12-14 MED ADMIN — levocarnitine (CARNITOR) (SF) oral solution: 500 mg | ORAL | @ 14:00:00

## 2024-12-14 MED ADMIN — midodrine (PROAMATINE) tablet 10 mg: 10 mg | ORAL | @ 12:00:00

## 2024-12-14 MED ADMIN — midodrine (PROAMATINE) tablet 10 mg: 10 mg | ORAL | @ 19:00:00

## 2024-12-14 MED ADMIN — clonazePAM (KlonoPIN) tablet 1 mg: 1 mg | ORAL | @ 14:00:00

## 2024-12-14 MED ADMIN — ipratropium-albuterol (DUO-NEB) 0.5-2.5 mg/3 mL nebulizer solution 3 mL: 3 mL | RESPIRATORY_TRACT | @ 16:00:00 | Stop: 2024-12-16

## 2024-12-14 MED ADMIN — ipratropium-albuterol (DUO-NEB) 0.5-2.5 mg/3 mL nebulizer solution 3 mL: 3 mL | RESPIRATORY_TRACT | @ 21:00:00 | Stop: 2024-12-16

## 2024-12-14 MED ADMIN — midodrine (PROAMATINE) tablet 15 mg: 15 mg | ORAL | @ 02:00:00

## 2024-12-14 MED ADMIN — cetirizine (ZYRTEC) tablet 10 mg: 10 mg | ORAL | @ 14:00:00

## 2024-12-14 MED ADMIN — enoxaparin (LOVENOX) syringe 40 mg: 40 mg | SUBCUTANEOUS | @ 14:00:00

## 2024-12-14 MED ADMIN — folic acid (FOLVITE) tablet 1 mg: 1 mg | ORAL | @ 14:00:00

## 2024-12-14 MED ADMIN — cenobamate (XCOPRI) tablet 400 mg: 400 mg | ORAL | @ 14:00:00 | NDC 71699005099

## 2024-12-14 MED ADMIN — lamoTRIgine (LaMICtal) tablet 200 mg: 200 mg | ORAL | @ 14:00:00

## 2024-12-14 MED ADMIN — lamoTRIgine (LaMICtal) tablet 200 mg: 200 mg | ORAL | @ 02:00:00

## 2024-12-14 MED ADMIN — famotidine (PF) (PEPCID) injection 20 mg: 20 mg | INTRAVENOUS | @ 02:00:00

## 2024-12-14 MED ADMIN — famotidine (PF) (PEPCID) injection 20 mg: 20 mg | INTRAVENOUS | @ 14:00:00

## 2024-12-14 MED ADMIN — clonazePAM (KlonoPIN) tablet 2 mg: 2 mg | ORAL | @ 02:00:00

## 2024-12-14 MED ADMIN — multivitamins, therapeutic with minerals tablet 1 tablet: 1 | ORAL | @ 14:00:00

## 2024-12-14 MED ADMIN — thiamine (B-1) injection 200 mg: 200 mg | INTRAVENOUS | @ 19:00:00 | Stop: 2024-12-15

## 2024-12-14 MED ADMIN — thiamine (B-1) injection 200 mg: 200 mg | INTRAVENOUS | @ 12:00:00 | Stop: 2024-12-15

## 2024-12-14 MED ADMIN — thiamine (B-1) injection 200 mg: 200 mg | INTRAVENOUS | @ 02:00:00 | Stop: 2024-12-15

## 2024-12-14 MED ADMIN — cefTRIAXone (ROCEPHIN) 1 g in sodium chloride 0.9 % (NS) 100 mL IVPB-MBP: 1 g | INTRAVENOUS | @ 14:00:00 | Stop: 2024-12-14

## 2024-12-14 MED ADMIN — divalproex ER (DEPAKOTE ER) extended released 24 hr tablet 1,500 mg: 1500 mg | ORAL | @ 02:00:00

## 2024-12-14 MED ADMIN — magnesium sulfate 2gm/50mL IVPB: 2 g | INTRAVENOUS | @ 14:00:00 | Stop: 2024-12-14

## 2024-12-14 NOTE — Plan of Care (Signed)
 Shift Summary  Peripheral IV dressing was changed at 4:00 AM due to significant soiling, maintaining site integrity.    Blood cultures were drawn and later reported no growth at 5 days.    Midodrine  was administered during the shift.    Fall prevention measures were maintained throughout, with no falls or injuries documented.    A-line removed in favor of well-correlated cuff pressures.   Appetite remains decreased, though patient able to finish a substantial snack at start of shift.     Absence of Infection Signs and Symptoms: Temperature remained stable throughout the shift, and blood cultures drawn showed no growth at 5 days; all IV and arterial line sites were clean, dry, and intact, with one peripheral IV dressing changed due to soiling. Lab results did not reveal any acute abnormalities related to infection.     Absence of Fall and Fall-Related Injury: Fall reduction interventions were consistently maintained, including bed alarm activation, hourly visual checks, and scheduled toileting; no falls or injuries occurred during the shift.     Improved Ability to Complete Activities of Daily Living: Maximum assistance was required for all mobility and hygiene tasks, with no change in level of independence noted during the shift.     Improved Nutritional Intake: Oral intake was limited to small volumes and snack intake was completed, but nutrition remained probably inadequate per Braden assessment and feeding required setup assistance.

## 2024-12-14 NOTE — Progress Notes (Signed)
 MICU Daily Progress Note     Date of Service: 12/14/2024    Problem List:   Principal Problem:    Shock, unspecified    (CMS-HCC)  Active Problems:    Seizures    (CMS-HCC)    Septic shock    (CMS-HCC)    Upper respiratory infection, viral    Infection due to human metapneumovirus (hMPV)      Interval history: Austin Banks is a 34 y.o. 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 He was admitted to the MICU on 1/14 for hypotension and ongoing pressor requirements.       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 No witnessed seizure activity at home. Home anti-epileptics of Depakote , Xcopri , Lamictal , Tocilizumab , Klonopin , and Nayzilam  for rescue. Patient reports frequently missing doses of antiepileptic medications. Reports multiple falls without head strike or LOC. CT head negative for acute intracranial process. EEG without discharges, not concerning for seizure activity. Therapeutic on valproate.  Neurology service feels this level somnolence is baseline.  Klonopin  has tried to be weaned in the past with more seizure-like activity.  More alert this morning after holding nightly Haldol .  Regularly has 1-3 nonconvulsive seizures per week and at least 1 GTC every 1 to 3 months.  ISO heavy cocaine use, question whether this could be cocaine withdrawal.  Could be a reactivation, on tocilizumab , but query whether the patient is taking given his baseline functional status, living alone.  With ongoing viral infection, ADEM also remains on the differential. Family called, asked to bring in tocilizumab . Able to ambulate 93ft with RW. PMR wondering about participation with [facility], feel subacute rehab wound be better suited for patient.  Brain MRI W Wo contrast without acute intracranial abnormality, advanced diffuse cortical and cerebellar atrophy.  This finding likely correlates with alcohol consumption, recurrent seizures on multiple antiepileptics  - Brain MRI with and without  - Neurology consulted, signed off after no evidence of seizure on EEG  - Follow-up Lamictal  level  - Continue home antiepileptic regimen  - TSH low, fT4 1.12 (normal). Euthyroid sick  - RPR nonreactive, HIV ordered     Hx polysubstance use  Nystagmus  Documented history of crack cocaine, tobacco, methamphetamine, and marijuana use. Urine tox in ED positive for amphetamines and cocaine. Negative ethanol on admission.  Presented with elevated AST. Smoked and snorted amphetamines prior to admission. Parents at bedside are aware of his substance use. Unclear how much etoh or if benzo/barb use present. Nystagmus present 1/15. No other signs of acute etoh withdrawal  - discontinue CIWA  - psychiatry consult     Dysmetria  Altered Mental Status  Patient has been lethargic, somnolent, and globally slow to respond since admission. His responsiveness has improved since this morning. Parents noted he was having difficulty eating with a fork, frequently bringing food to the wrong part of his face. C/f low vision. CT head 1/13 showed mild brain volume loss of the cerebellum, a new finding compared to prior imaging in 01/2024. Bedside cerebellar exam could not be completed due to lethargy, somnolence. Pupils are  small but equal and reactive to light. Folate/b12 wnl. Previously taking thiamine  supplement, stopped earlier this year. Lower BMI, c/f chronic malnut. Question reasoning for antipsychotic use  - IV thiamine  200mg  x5d, then po 100mg  daily  - Mg repleted  - Neurology following, appreciate recs  - Continue to monitor with daily cerebellar, eye exam  - Serum ammonia wnl  - PT/OT  - hold nightly haloperidol     Schizophrenia  Hx mania  Per chart review, patient with longstanding history of schizophrenia previously on dual antipsychotic therapy with Zyprexa and Risperdal .  In 2015, presented with manic episode with psychotic features.  Zyprexa discontinued out of concern it was lowering seizure threshold.  Longstanding history of medication noncompliance and heavy drug use.  Throughout this hospitalization, patient has remained somnolent, only responsive to sternal rub at times along with bradykinesia and speech delay.  Ongoing visual hallucinations and delusional thoughts  - psychiatry consulted  - Nightly haloperidol  resumed at 10mg       Analgesia: Pain adequately controlled  RASS at goal? N/A, not on sedation  [place] Agitation Assessment Scale (RASS) : 0 (12/14/2024  4:00 AM)       Pulmonary   Viral upper respiratory infection, Metapneumovirus +  Presented with 2-3 days of general malaise, fevers, and productive cough. On arrival to ED patient had refractory hypotension requiring pressors (see below) and leukocytosis with WBC 13. CXR appears to be consistent with viral infection, however read is unable to exclude pneumonia.  Given presentation, will treat for bacterial pneumonia with 5-day course ceftriaxone  (EOT1/20)  -Ceftriaxone  (EOT 1/20)  - duonebs   - CXR  - continues on RA  - repeat VBG daily iso somnolence, c/f cerebellar dysfunction  - flu vaccine, PCV    No results for input(s): SPECTYPEART, PHART, PCO2ART, PO2ART, HCO3ART, BEART, O2SATART in the last 24 hours.      O2 Device: None (Room air)    Cardiovascular   Septic shock secondary to Metapneumovirus versus pneumonia  Presented with hypotension refractory to 2 L IV fluids.  Lactate peaked at 3.7, improved with fluids. Started on pressors.  Additional liter LR received 1/16 2/2 increasing pressor requirements.  - midodrine  09/04/14  - remains on NE gtt, MAP goal >60  - See ID section for full infectious workup    Hypotension- improved  Bradycardia  Autonomic Dysfunction  Sudden drops in HR correlating with drops in BP. Low TSH, total T4 low, fT4 wnl. Likely low TBG iso chronic malnutrition. Suspect downregulated sympathetic tone ISO cocaine use. EKG with sinus brady and sinus arrhythmia, no AVB.  No evidence of electrolyte disorders, hypothermia, ischemia.  Have been open unable to assess chronotropic response due to poor participation in exam, but this would support sinus node dysfunction.  Occasionally complaining of dizziness, however has horizontal nystagmus at baseline, feel this is unlikely cardiac.  - orthostatic vitals  - chronotropic response eval      Renal   AKI-resolved  Creatinine 2.13 at presentation (no history of prior renal dysfunction). Likely prerenal iso viral infection and multiple days of malaise and poor appetite. Given 2L IV fluids in the ED. UA with protein, improved Cr 0.85 (1.32).  CK not suggestive of ongoing rhabdo  - Daily BMP    Intake/Output Summary (Last 24 hours) at 12/14/2024 0818  Last data filed at 12/14/2024 0600  Gross per 24 hour   Intake 700 ml   Output 600 ml   Net 100 ml  Infectious Disease/Autoimmune   Septic Shock  Metapneumo upper respiratory viral infection  CAP  Presented with multiple days of weakness, fevers, cough, hypotension, and leukocytosis WBC 13. Hypotension refractory to 2L IV fluids, ultimately requiring initiation of pressors. Infectious workup started in the ED due to persist hypotension and history of immunocompromise (takes Tocilizumab  for epilepsy). RPP positive for metapneumovirus. CXR with concerns for atelectasis vs aspiration, pneumonia cannot be excluded. Empirically treated with Vanc, Zosyn  in ED. Bcx NGTD, negative MRSA screen. UA not concerning for infection.  RUQ ultrasound with small amount of gallbladder sludge, no evidence of cholecystitis or ductal dilatation.  Small juxta cardiac opacity on 1/14 CXR.  Will complete empiric treatment for bacterial pneumonia given improvement while on antibiotics  - Continue CTX to complete 5d course (EOT 1/20)  - Pressors for hypotension as above (See Cardio section)    Cultures:  Blood Culture, Routine (no units)   Date Value   12/08/2024 No Growth at 5 days   12/08/2024 No Growth at 5 days     WBC (10*9/L)   Date Value   12/14/2024 5.0     WBC, UA (/HPF)   Date Value   12/09/2024 <1            FEN/GI   Failure to thrive  Patient with poor po intake, father provided that he is somnolent at baseline and has people in and out of the home, suspects they steal his food. 25lb weight loss over the past year. Overall concerning picture and may need durable feeding plan.  Based on presentation while inpatient, would be surprising if he were able to ambulate enough to feed himself while at home.  Considering PEG tube  - calorie counting    Provider Malnutrition Assessment:  Body mass index is 19.92 kg/m??.BMI Interpretation: within normal limits.  GLIM criteria:   Pt with severe muscle loss (e.g., severe temporal wasting) -- severe malnutrition  -I have screened this patient for malnutrition and they did meet criteria for malnutrition based on GLIM criteria.  -Nutrition consulted yes, see RD assessment below             Heme/Coag   Thrombocytopenia  Platelets 66. No prior recorded episodes of thrombocytopenia. LFT significant for mildly elevated AST. Low concern for active hemorrhage. Consider ITP iso recent viral infection; diagnosis of exclusion. B12, Folate wnl. Labs not consistent with DIC. Mildly elevated D dimer. Likely bone marrow suppression  - repeat CBC, smear, DIC panel  - Peripheral smear  - restart lovenox  for DVT ppx    Endocrine   NAI    Integumentary     #  - WOCN consulted for high risk skin assessment No. Reason: n/a.  - WOCN recs >> n/a  - cont pressure mitigating precautions per skin policy    Prophylaxis/LDA/Restraints/Consults   ICU Checklist completed:  see ICU rounding navigator      Patient Lines/Drains/Airways Status       Active Active Lines, Drains, & Airways       Name Placement date Placement time Site Days    Peripheral IV 12/08/24 Anterior;Left Forearm 12/08/24  2154  Forearm  5    Peripheral IV 12/12/24 Left;Posterior;Proximal Forearm 12/12/24  1530  Forearm  1                  Patient Lines/Drains/Airways Status       Active Wounds       None  Goals of Care     Code Status:   Orders Placed This Encounter   Procedures    Full Code     Standing Status:   Standing     Number of Occurrences:   1        Designated Healthcare Decision Maker:  Mr. Varady designated healthcare decision maker(s) is/are   HCDM (patient stated preference): Jadyn, Barge - Father - [phone]    HCDM (patient stated preference): Hillard, [place] - Mother - [phone]. See HCDM section of Epic sidebar/storyboard or ACP tab in patient chart for details regarding active HCDMs and patient capacity for decision-making.      Subjective     More awake today, continued nystagmus. Attempting to get out of bed. No acute complaints.    Objective     Vitals - past 24 hours  Temp:  [36.1 ??C (97 ??F)-36.7 ??C (98 ??F)] 36.7 ??C (98 ??F)  Pulse:  [41-62] 47  SpO2 Pulse:  [41-65] 45  Resp:  [13-22] 21  BP: (93-141)/(38-75) 109/46  SpO2:  [91 %-100 %] 97 % Intake/Output  I/O last 3 completed shifts:  In: 1265.2 [P.O.:1150; I.V.:15.2; IV Piggyback:100]  Out: 950 [Urine:950]     Physical Exam:    General: Lying in bed, no acute distress  HEENT: Ongoing horizontal nystagmus  CV: Bradycardia, reg rhythm. No murmurs auscultated.  Pulm: Cough present productive of thick, clear mucus.  Diffuse coarse breath sounds throughout lung fields.  GI: Non-distended, non-tender to palpation.  Skin: No rashes or wounds appreciated on clothed exam.  Neuro: Bradykinetic, more arousable. A+Ox4. Visual hallucinations present.      Continuous Infusions:   Infusions Meds[1]    Scheduled Medications:   Scheduled Medications[2]    PRN medications:  PRN Medications[3]    Data/Imaging Review: Reviewed in Epic and personally interpreted on 12/14/2024. See EMR for detailed results.      Nastassja Witkop, MD  PGY1 Internal Medicine              [1]    NORepinephrine  bitartrate-NS     [2]    tocilizumab   162 mg Subcutaneous Q7 Days    cefTRIAXone   1 g Intravenous Q24H [facility]    cenobamate   400 mg Oral Daily    cetirizine   10 mg Oral Q AM    clonazePAM   1 mg Oral Daily    clonazePAM   2 mg Oral Nightly    divalproex  ER  1,500 mg Oral Nightly    enoxaparin  (LOVENOX ) injection  40 mg Subcutaneous Q24H    famotidine  (PEPCID ) IV  20 mg Intravenous BID    folic acid   1 mg Oral Daily    lamoTRIgine   200 mg Oral BID    levocarnitine  (SF)  500 mg Oral BID    midodrine   10 mg Oral BID    midodrine   15 mg Oral Nightly    multivitamins (ADULT)  1 tablet Oral Daily    thiamine   200 mg Intravenous Q8H [facility]   [3] acetaminophen , aluminum-magnesium  hydroxide-simethicone, guaiFENesin , ipratropium-albuterol , melatonin, OLANZapine zydis, senna

## 2024-12-14 NOTE — Consults (Signed)
 [facility]  Initial Psychiatry Consult Note      Date of admission: 12/08/2024  9:03 PM  Service Date: December 14, 2024  Primary Team: Medical ICU (MDI)  LOS:  LOS: 5 days      Assessment:   Austin Banks is a 34 y.o. 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  Patient was seen in consultation by request of Lamar Jackquline Schiller, MD for evaluation of altered mental status.     Austin Banks presents with symptoms, including visual hallucinations and grandiose delusions, consistent with his long-standing prior diagnosis of schizophrenia. Based on chart review these symptoms have been present outside of periods of substance use.  Patient's presentation is further complicated by his h/o polysubstance use disorder. Given that his admission UDS was +cocaine, acute cocaine withdrawal is likely contributing to the worsening of his baseline somnolence. The psychiatry team does not believe that his Haldol  is a major contributor to his somnolence, given that it was stopped 5 days ago and his symptoms have persisted since that time. Notably, per patient's family and on chart-review of outpatient neuro notes, the patient is generally fairly somnolent with difficult arousibility and slurred speech at baseline. Given this, his sedation is likely multifactorial in the setting of his baseline somnolence, acute illness, cocaine withdrawal, ASM regimen, and chronic neurologic conditions.  We believe the risk of stopping his antipsychotic medication and triggering a decompensation of his schizophrenia outweighs any potential benefit of decreased sedation. We recommend restarting Haldol  at 10 mg nightly. We will continue to follow this patient to monitor response to this medication change and make further adjustments and recommendations as needed.     In addition, new abnormal findings on neurologic exam (asymmetric face at rest, asymmetric eyebrow raise and eye tightening) were relayed to primary team face-to-face.     Diagnoses:   Active Hospital problems:  Principal Problem:    Shock, unspecified    (CMS-HCC)  Active Problems:    Seizures    (CMS-HCC)    Septic shock    (CMS-HCC)    Upper respiratory infection, viral    Infection due to human metapneumovirus (hMPV)       Problems edited/added by me:  No problems updated.    Risk Assessment:  ASQ screening result: not completed    -A suicide and violence risk assessment was performed as part of this evaluation. Risk factors for self-harm/suicide: current substance abuse, diagnosis of schizophrenia, male age 78-35, past head injury, chronic severe medical condition, and chronic mental illness > 5 years.  Protective factors against self-harm/suicide:  lack of active SI, no known access to weapons or firearms, currently receiving mental health treatment, supportive family, presence of an available support system, and support system in agreement with treatment recommendations.  Risk factors for harm to others: current substance abuse and active symptoms of psychosis. Protective factors against harm to others: no known violence towards others in the last 6 months and connectedness to family.     Current suicide risk: low risk  Current homicide risk: low risk        Recommendations:     Safety and Observation Level:   -- This patient  is not currently under IVC. If safety concerns arise, please page psychiatry for an evaluation. Recommend routine level of observation per primary team.    Medications:  -- RESTART Haldol  po at 10 mg nightly.   -- Continue Thiamine  200 mg IV Q8H x 5 days.  -- Continue home antiepileptic regimen as outlined by neurology.     Further Work-up:   -- As discussed with primary team, have a low threshold to repeat head imaging if facial asymmetry new/worsening from baseline    Behavioral / Environmental:   -- Although not currently delirious, the patient is at an elevated risk for developing delirium. Please utilize delirium prevention protocol.  -- Please order Delirium (prevention) protocol: the following can be copied into a single misc nursing order.        - RN to open blinds every morning.        - To bedside: glasses, hearing aide, patient's own shoes. Make available to patient's when possible and encourage use.        - RN to assess orientation (person, place, & time) qam and prn, with frequent reorientation (verbal & whiteboard) & introduction of caregivers.           - Recommend extended visiting hours with familiar family/friends as feasible.        - Encourage normal sleep-wake cycle by promoting a dark, quiet environment at night and stimulating, light environment during the day.          - Turn the TV off when patient is asleep or not in use.    Follow-up:  -- When patient is discharged, please ensure that their AVS includes information about the 67 Suicide & Crisis Lifeline.  -- The patient will follow-up with his ACT team for mental health care at the time of discharge.  -- We will follow as needed at this time.     Thank you for this consult request. Recommendations have been communicated to the primary team. Please page [phone] (adult psychiatry consults) for any questions or concerns.     Discussed with and seen by Attending, Pleasant Hover, MD, who agrees with the assessment and plan.    Austin SHAUNNA Sharps, MD      I saw and evaluated the patient, participating in the key portions of the service.  I reviewed the resident???s note.  I agree with the resident???s findings and plan. Family and patient in agreement with plan.    Pleasant SHAUNNA Hover, MD        Subjective     Relevant Aspects of Hospital Course: Admitted on 12/08/24 for c/f septic shock 2/2 URTI infection.   - Tested +metapneumovirus. CXR w/ no signs of pneumonia. Has been weened off O2 to RA.   - Neurology consulted: EEG not c/f seizure activity. Per their chart review and d/w pt's outpt neurologist; while patient is somewhat more somnolent ISO infection and recent seizure at home, he is not far from his baseline. Patient is notably somnolent at baseline and requires stimulation to wake-up.  - CTH w/ no acute IC processes, similar to prior imaging. Brain MRI w/ no acute abnormality but significant for diffuse cortical and cerebellar atrophy that is advanced for the patient's age.  - Patient has been persistently somnolent during his admission, Haldol  was held on 1/16 with some reported reduction in sedation, yet patient still needing to be awoken via sternal rub on 1/18.     HPI:     On approach for interview  patient is resting in bed with his mother and father at bedside. Easily awoken to voice. Does doze off occasionally during interview, especially when team speaks with parents, but easily re-awoken. He states his mood is fine. Willing to cooperate with orientation and attention testing; Ox4 and able to name days of the week forward and backward. He endorses visual hallucinations described as wings on the back of people. States psych attending has [employer]. Also describes grandiose delusions including that he has died and met God who told him when he dies the world will end. Pt believes that he is Jesus. States that God told him that because the pt is above all others he does not need to pray anymore, but the pt states he continues to pray anyway. Denies SI, HI. Patient denies hearing any voices while in the hospital, feels well-treated by staff. Per pt's parents these are long-standing, deeply held delusions.     Pt's parents state the patient is very drowsy at baseline. Pt's father reports that when he drives the pt to the grocery store he will fall asleep during the ride there and back. They do think the level of sedation has been increased while in the hospital. They also note that the patient has long-standing horizontal nystagmus, as well as a slight left eye-lid droop that they believe is more prominent today. 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       This evaluation was completed via collecting data from the following   - Reviewed medical records in Epic  - Spoke to RN: she relayed shift reported she received from outgoing nurse  - Spoke to patient's parents.     ROS: negative unless otherwise stated in HPI    Psychiatric History:   Prior psychiatric diagnoses:Schizphrenia, PSUD  Psychiatric hospitalizations: mutliple including [facility] in 2019, [facility] in 2017  Suicide attempts / Non-suicidal self-injury: did not ask  Medication trials: risperdal , invega sustenna, zyprexa, trazodone   Current psychiatrist: [facility] [employer] [employer])  Current therapist: ^^^  Other treatments: did not ask    Family Psychiatric History: not obtained today    Substance Use History:  Tobacco use: current  Alcohol use: previously, not currently  Other substance use: marijuana, methamphetamine  Substance use disorder treatment: unknown  UDS results: +amphetamines, cocaine  BAL on admission: <10    Social History:   Patient lives alone in an apartment  Highest level of education: unknown  Important relationships: mother and father  Employment status: unemployed  Armed Forces Operational Officer history: unknown  Hotel Manager history: unknown  Firearms: did not ask today    Medical History:    has a past medical history of Fall, Hepatitis B surface antigen positive, Schizophrenia (CMS-HCC), Seizure    (CMS-HCC), TBI (traumatic brain injury) (CMS-HCC), and Vitamin D deficiency.    Surgical History:   has a past surgical history that includes Mandible fracture surgery.    Medications:   Current Medications[1]    Allergies:  Lacosamide     Objective:   Vital signs:   Temp:  [36 ??C (96.8 ??F)-36.7 ??C (98 ??F)] 36 ??C (96.8 ??F)  Pulse:  [41-62] 57  SpO2 Pulse:  [41-76] 57  Resp:  [13-23] 17  BP: (90-141)/(38-92) 90/39  MAP (mmHg):  [54-103] 54  A BP-2: (90-148)/(57-85) 136/79  MAP:  [78 mmHg-108 mmHg] 101 mmHg  SpO2:  [91 %-100 %] 95 %    Physical Exam:  Gen: No acute distress. somnolent  Pulm: Normal work of breathing.  Neuro/MSK: Tone normal. Clonus is absent. Extraocular movements Restricted left gaze though crosses midline and can nearly bury with right-beating nystagmus on left gaze persistent. Face is asymmetric at rest (left eyelid drooping slightly more than right). Left eyebrow does not raise as high as the right, unable to squeeze left eye as tightly as right. Smile/frown even BL. Hearing intact to conversation. Normal bulk/tone. Bulk is normal in all four extremities. Negative gegenhalten in all four extremities or cogwheeling in all four extremities. Moving all extremities equally and spontaneously. Gait and station deferred given clinical .  Skin: warm and dry.    Mental Status Exam:  Appearance:  appears stated age, ill-appearing, and in bed   Attitude:   calm, cooperative   Behavior/Psychomotor:  appropriate eye contact and no abnormal movements   Speech/Language:   normal rate, not pressured, reduced volume, normal fluency. impaired articulation (intermittent slurred speech)   Mood:  ???fine???   Affect:  mood congruent and decreased range (constricted)   Thought process:  logical, linear, clear, coherent, goal directed   Thought content:    +grandiose delusions, hyper-religiosity. Denies SI, HI, does not endorse any paranoid ideation.    Perceptual disturbances:   visual hallucinations of wings on people's backs   Attention/concentration:  fluctuating alertness prevents attention to interview and on days of the week backwards performs correctly   Orientation:  Oriented to person, place, day, month, year, and situation.   Memory:  not formally tested, but grossly intact   Fund of knowledge:   not formally assessed   Insight:    Impaired   Judgment:   Fair   Impulse Control:  Fair     Relevant laboratory/imaging data was reviewed.    Additional Psychometric Testing:  Not applicable.    Time-based billing disclaimer:  I personally spent 120   minutes face-to-face and non-face-to-face in the care of this patient, which includes all pre, intra, and post visit time on the date of service.  All documented time was specific to the E/M visit and does not include any procedures that may have been performed.         [1]   Current Facility-Administered Medications:     **Patient Supplied** tocilizumab  (ACTEMRA  ACTPEN) subcutaneous injection 162 mg, 162 mg, Subcutaneous, Q7 Days, Milissa Birmingham T, MD, 162 mg at 12/11/24 1327    acetaminophen  (TYLENOL ) tablet 650 mg, 650 mg, Oral, Q6H PRN, Cookmeyer, Alm CROME, MD    aluminum-magnesium  hydroxide-simethicone (MAALOX MAX) 80-80-8 mg/mL oral suspension, 30 mL, Oral, Q4H PRN, Cookmeyer, Alm CROME, MD    cenobamate  (XCOPRI ) tablet 400 mg, 400 mg, Oral, Daily, Milissa Birmingham T, MD, 400 mg at 12/14/24 9140    cetirizine  (ZYRTEC ) tablet 10 mg, 10 mg, Oral, Q AM, Cookmeyer, Alm CROME, MD, 10 mg at 12/14/24 9140    clonazePAM  (KlonoPIN ) tablet 1 mg, 1 mg, Oral, Daily, Cookmeyer, Alm CROME, MD, 1 mg at 12/14/24 9140    clonazePAM  (KlonoPIN ) tablet 2 mg, 2 mg, Oral, Nightly, Cookmeyer, Alm CROME, MD, 2 mg at 12/13/24 2116    divalproex  ER (DEPAKOTE  ER) extended released 24 hr tablet 1,500 mg, 1,500 mg, Oral, Nightly, Cookmeyer, Alm CROME, MD, 1,500 mg at 12/13/24 2116    enoxaparin  (LOVENOX ) syringe 40 mg, 40 mg, Subcutaneous, Q24H, Milissa Birmingham DASEN, MD, 40 mg at 12/14/24 0859    famotidine  (PF) (PEPCID ) injection 20 mg, 20 mg, Intravenous, BID, Johnetta Savant, MD, 20 mg at 12/14/24 [phone]  flu vaccine TS 2025-26 (Fluarix,Flulaval,Fluzone) (6mos up)(PF) 45 mcg(15mcgx3)/0.5 mL IM syringe, 0.5 mL, Intramuscular, During hospitalization, Brutus Lamar Rattler, MD    folic acid  (FOLVITE ) tablet 1 mg, 1 mg, Oral, Daily, Cookmeyer, Alm CROME, MD, 1 mg at 12/14/24 9140    guaiFENesin  (ROBITUSSIN) oral syrup, 200 mg, Oral, Q4H PRN, Cookmeyer, Alm CROME, MD    haloperidol  (HALDOL ) tablet 10 mg, 10 mg, Oral, Nightly, Milissa Artist DASEN, MD    ipratropium-albuterol  (DUO-NEB) 0.5-2.5 mg/3 mL nebulizer solution 3 mL, 3 mL, Nebulization, Q6H (RT), Mulvey, Jade L, MD, 3 mL at 12/14/24 1533    lamoTRIgine  (LaMICtal ) tablet 200 mg, 200 mg, Oral, BID, Cookmeyer, Alm CROME, MD, 200 mg at 12/14/24 9140    levocarnitine  (CARNITOR ) (SF) oral solution, 500 mg, Oral, BID, Cookmeyer, Alm CROME, MD, 500 mg at 12/14/24 0859    melatonin tablet 3 mg, 3 mg, Oral, Nightly PRN, Cookmeyer, David L, MD, 3 mg at 12/10/24 2017    midodrine  (PROAMATINE ) tablet 10 mg, 10 mg, Oral, BID, Mulvey, Jade L, MD, 10 mg at 12/14/24 1348    midodrine  (PROAMATINE ) tablet 15 mg, 15 mg, Oral, Nightly, Mulvey, Jade L, MD, 15 mg at 12/13/24 2116    multivitamins, therapeutic with minerals tablet 1 tablet, 1 tablet, Oral, Daily, Cookmeyer, Alm CROME, MD, 1 tablet at 12/14/24 9140    NORepinephrine  8 mg in dextrose  5 % 250 mL (32 mcg/mL) infusion PMB, 0-30 mcg/min, Intravenous, Continuous, Johnetta Savant, MD    OLANZapine zydis (ZYPREXA) disintegrating tablet 5 mg, 5 mg, Oral, Daily PRN, Milissa Artist DASEN, MD    pneumococcal 21-valent (PF) vaccine (CAPVAXIVE) injection 0.5 mL, 0.5 mL, Intramuscular, During hospitalization, Hagan, Robert Stewart, MD    senna [facility]) tablet 2 tablet, 2 tablet, Oral, Nightly PRN, Cookmeyer, Alm CROME, MD    thiamine  (B-1) injection 200 mg, 200 mg, Intravenous, Q8H SCH, Johnetta Savant, MD, 200 mg at 12/14/24 1348

## 2024-12-15 LAB — CBC
HEMATOCRIT: 38.5 % — ABNORMAL LOW (ref 39.0–48.0)
HEMOGLOBIN: 13 g/dL (ref 12.9–16.5)
MEAN CORPUSCULAR HEMOGLOBIN CONC: 33.7 g/dL (ref 32.0–36.0)
MEAN CORPUSCULAR HEMOGLOBIN: 32 pg (ref 25.9–32.4)
MEAN CORPUSCULAR VOLUME: 95.1 fL (ref 77.6–95.7)
MEAN PLATELET VOLUME: 8.9 fL (ref 6.8–10.7)
PLATELET COUNT: 143 10*9/L — ABNORMAL LOW (ref 150–450)
RED BLOOD CELL COUNT: 4.05 10*12/L — ABNORMAL LOW (ref 4.26–5.60)
RED CELL DISTRIBUTION WIDTH: 12.7 % (ref 12.2–15.2)
WBC ADJUSTED: 4.5 10*9/L (ref 3.6–11.2)

## 2024-12-15 LAB — BASIC METABOLIC PANEL
ANION GAP: 12 mmol/L (ref 5–14)
BLOOD UREA NITROGEN: 8 mg/dL — ABNORMAL LOW (ref 9–23)
BUN / CREAT RATIO: 9
CALCIUM: 8.7 mg/dL (ref 8.7–10.4)
CHLORIDE: 104 mmol/L (ref 98–107)
CO2: 27 mmol/L (ref 20.0–31.0)
CREATININE: 0.92 mg/dL (ref 0.73–1.18)
EGFR CKD-EPI (2021) MALE: >90 mL/min/1.73m2 (ref >=60)
GLUCOSE RANDOM: 78 mg/dL (ref 70–179)
POTASSIUM: 3.9 mmol/L (ref 3.4–4.8)
SODIUM: 143 mmol/L (ref 135–145)

## 2024-12-15 LAB — ADAMTS13: ADAMTS13 ACTIVITY: >100 % (ref >=80)

## 2024-12-15 LAB — MAGNESIUM: MAGNESIUM: 1.9 mg/dL (ref 1.6–2.6)

## 2024-12-15 MED ADMIN — levocarnitine (CARNITOR) (SF) oral solution: 500 mg | ORAL | @ 14:00:00

## 2024-12-15 MED ADMIN — levocarnitine (CARNITOR) (SF) oral solution: 500 mg | ORAL | @ 02:00:00

## 2024-12-15 MED ADMIN — midodrine (PROAMATINE) tablet 10 mg: 10 mg | ORAL | @ 10:00:00

## 2024-12-15 MED ADMIN — midodrine (PROAMATINE) tablet 10 mg: 10 mg | ORAL | @ 19:00:00

## 2024-12-15 MED ADMIN — clonazePAM (KlonoPIN) tablet 1 mg: 1 mg | ORAL | @ 14:00:00

## 2024-12-15 MED ADMIN — ipratropium-albuterol (DUO-NEB) 0.5-2.5 mg/3 mL nebulizer solution 3 mL: 3 mL | RESPIRATORY_TRACT | @ 22:00:00 | Stop: 2024-12-16

## 2024-12-15 MED ADMIN — ipratropium-albuterol (DUO-NEB) 0.5-2.5 mg/3 mL nebulizer solution 3 mL: 3 mL | RESPIRATORY_TRACT | @ 18:00:00 | Stop: 2024-12-16

## 2024-12-15 MED ADMIN — ipratropium-albuterol (DUO-NEB) 0.5-2.5 mg/3 mL nebulizer solution 3 mL: 3 mL | RESPIRATORY_TRACT | @ 03:00:00 | Stop: 2024-12-16

## 2024-12-15 MED ADMIN — ipratropium-albuterol (DUO-NEB) 0.5-2.5 mg/3 mL nebulizer solution 3 mL: 3 mL | RESPIRATORY_TRACT | @ 09:00:00 | Stop: 2024-12-16

## 2024-12-15 MED ADMIN — midodrine (PROAMATINE) tablet 15 mg: 15 mg | ORAL | @ 02:00:00

## 2024-12-15 MED ADMIN — cetirizine (ZYRTEC) tablet 10 mg: 10 mg | ORAL | @ 14:00:00

## 2024-12-15 MED ADMIN — enoxaparin (LOVENOX) syringe 40 mg: 40 mg | SUBCUTANEOUS | @ 14:00:00

## 2024-12-15 MED ADMIN — folic acid (FOLVITE) tablet 1 mg: 1 mg | ORAL | @ 14:00:00

## 2024-12-15 MED ADMIN — cenobamate (XCOPRI) tablet 400 mg: 400 mg | ORAL | @ 14:00:00 | NDC 71699005099

## 2024-12-15 MED ADMIN — lamoTRIgine (LaMICtal) tablet 200 mg: 200 mg | ORAL | @ 14:00:00

## 2024-12-15 MED ADMIN — lamoTRIgine (LaMICtal) tablet 200 mg: 200 mg | ORAL | @ 02:00:00

## 2024-12-15 MED ADMIN — famotidine (PF) (PEPCID) injection 20 mg: 20 mg | INTRAVENOUS | @ 02:00:00

## 2024-12-15 MED ADMIN — famotidine (PF) (PEPCID) injection 20 mg: 20 mg | INTRAVENOUS | @ 14:00:00

## 2024-12-15 MED ADMIN — clonazePAM (KlonoPIN) tablet 2 mg: 2 mg | ORAL | @ 02:00:00

## 2024-12-15 MED ADMIN — multivitamins, therapeutic with minerals tablet 1 tablet: 1 | ORAL | @ 14:00:00

## 2024-12-15 MED ADMIN — thiamine (B-1) injection 200 mg: 200 mg | INTRAVENOUS | @ 04:00:00 | Stop: 2024-12-15

## 2024-12-15 MED ADMIN — thiamine (B-1) injection 200 mg: 200 mg | INTRAVENOUS | @ 10:00:00 | Stop: 2024-12-15

## 2024-12-15 MED ADMIN — divalproex ER (DEPAKOTE ER) extended released 24 hr tablet 1,500 mg: 1500 mg | ORAL | @ 02:00:00

## 2024-12-15 MED ADMIN — haloperidol (HALDOL) tablet 10 mg: 10 mg | ORAL | @ 02:00:00

## 2024-12-15 MED FILL — ACTEMRA ACTPEN 162 MG/0.9 ML SUBCUTANEOUS PEN INJECTOR: SUBCUTANEOUS | 28 days supply | Qty: 3.6 | Fill #1

## 2024-12-15 NOTE — Plan of Care (Signed)
 Shift Summary  Pain remained at 0/10 throughout the shift, and no discomfort was reported.   Family was present and involved, and the patient consented to PT during the shift.   Infection prevention and safety interventions were consistently maintained, and no new complications were documented.   Overall, the patient participated in care and therapy, and discharge planning continued with therapy goals in progress.    Absence of Hospital-Acquired Illness or Injury: No new hospital-acquired injuries or complications were documented during the shift. Peripheral IV sites on the left forearm remained clean, dry, and intact throughout the day, and safety interventions such as fall reduction and aspiration precautions were maintained.    Optimal Comfort and Wellbeing: Pain was consistently reported as 0/10, and no discomfort was noted or reported during the shift.    Readiness for Transition of Care: MAP and SpO2 fluctuated throughout the shift, with periods of lower values in the early afternoon, but the patient remained alert and calm and consented to PT. Discharge planning is ongoing, with an expected transfer date documented and therapy goals in progress.    Rounds/Family Conference: Family was present at the bedside during the shift, and no new family goals were stated during evaluation.    Absence of Infection Signs and Symptoms: No fever was recorded, perineal hygiene and environmental surveillance were performed, and both peripheral IV sites remained clean and intact. Contact and droplet precautions were maintained throughout the shift.

## 2024-12-15 NOTE — Progress Notes (Signed)
 MICU Daily Progress Note     Date of Service: 12/15/2024    Problem List:   Principal Problem:    Shock, unspecified    (CMS-HCC)  Active Problems:    Paranoid schizophrenia    (CMS-HCC)    Seizures    (CMS-HCC)    Septic shock    (CMS-HCC)    Upper respiratory infection, viral    Infection due to human metapneumovirus (hMPV)      Interval history: Austin Banks is a 34 y.o. 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 He was admitted to the MICU on 1/14 for hypotension and ongoing pressor requirements. As of today, hemodynamically stable off pressors. Stable for floor status.      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 No witnessed seizure activity at home. Home anti-epileptics of Depakote , Xcopri , Lamictal , Tocilizumab , Klonopin , and Nayzilam  for rescue. Patient reports frequently missing doses of antiepileptic medications. Reports multiple falls without head strike or LOC. CT head negative for acute intracranial process. EEG without discharges, not concerning for seizure activity. MRI with/without shows diffuse cortical and cerebellar atrophy advanced for pt's age, but no acute findings. Therapeutic on valproate.  Neurology service feels this level of somnolence is baseline.  Klonopin  has tried to be weaned in the past with more seizure-like activity.  More alert this morning after holding nightly Haldol .  Regularly has 1-3 nonconvulsive seizures per week and at least 1 GTC every 1 to 3 months.  ISO heavy cocaine use, question whether this could be cocaine withdrawal.  Could be a reactivation, on tocilizumab , but query whether the patient is taking given his baseline functional status, living alone.  With ongoing viral infection, ADEM also remains on the differential. Family called, asked to bring in tocilizumab . Able to ambulate 70ft with RW. PMR wondering about participation with [facility], feel subacute rehab wound be better suited for patient.  Brain MRI W Wo contrast without acute intracranial abnormality, advanced diffuse cortical and cerebellar atrophy.  This finding likely correlates with alcohol consumption, recurrent seizures on multiple antiepileptics  - Neurology consulted, signed off after no evidence of seizure on EEG  - Follow-up Lamictal  level  - Continue home antiepileptic regimen (depakote , klonopin , lamictal , cenobamate )  - TSH low, fT4 1.12 (normal). Subclinical hypothyroidism vs euthyroid sick syndrome.  - RPR nonreactive, HIV non-reactive     Hx polysubstance use  Nystagmus  Documented history of crack cocaine, tobacco, methamphetamine, and marijuana use. Urine tox in ED positive for amphetamines and cocaine. Negative ethanol on admission.  Presented with elevated AST. Smoked and snorted amphetamines prior to admission. Parents at bedside are aware of his substance use. Unclear how much etoh or if benzo/barb use present. Nystagmus present 1/15. No other signs of acute etoh withdrawal  - discontinue CIWA  - psychiatry consult     Dysmetria  Altered Mental Status  Patient has been lethargic, somnolent, and globally slow to respond since admission. His responsiveness has improved since this morning. Parents noted he was having difficulty eating with a fork, frequently bringing food to the wrong part of his  face. C/f low vision. CT head 1/13 showed mild brain volume loss of the cerebellum, a new finding compared to prior imaging in 01/2024. Bedside cerebellar exam could not be completed due to lethargy, somnolence. Pupils are small but equal and reactive to light. Folate/b12 wnl. Previously taking thiamine  supplement, stopped earlier this year. Lower BMI, c/f chronic malnut. Question reasoning for antipsychotic use  - IV thiamine  200mg  x5d, then po 100mg  daily  - Mg repleted  - Neurology following, appreciate recs  - Continue to monitor with daily cerebellar, eye exam  - Serum ammonia wnl  - PT/OT    Schizophrenia  Hx mania  Per chart review, patient with longstanding history of schizophrenia previously on dual antipsychotic therapy with Zyprexa and Risperdal .  In 2015, presented with manic episode with psychotic features.  Zyprexa discontinued out of concern it was lowering seizure threshold.  Longstanding history of medication noncompliance and heavy recreational drug use. Initially quite somnolent, requiring sternal rub to wake up, active AVM. Started on haldol  10 mg HS per psych 2 days ago. Improved clinically since admission. Less somnolent. Now no longer having AVM. More coherent speech. AOx4.  - psychiatry following. They recommend increasing haldol  to 15 mg HS, pt's home dose.      Analgesia: Pain adequately controlled  RASS at goal? N/A, not on sedation  [place] Agitation Assessment Scale (RASS) : 0 (12/15/2024  6:00 AM)       Pulmonary   Viral upper respiratory infection, Metapneumovirus +  Presented with 2-3 days of general malaise, fevers, and productive cough. On arrival to ED patient had refractory hypotension requiring pressors (see below) and leukocytosis with WBC 13. CXR appears to be consistent with viral infection, however read is unable to exclude pneumonia.  Given presentation, will treat for bacterial pneumonia with 5-day course ceftriaxone  (EOT1/20)  -Ceftriaxone  (EOT 1/20)  - duonebs   - CXR  - continues on RA  - repeat VBG daily iso somnolence, c/f cerebellar dysfunction  - flu vaccine, PCV    No results for input(s): SPECTYPEART, PHART, PCO2ART, PO2ART, HCO3ART, BEART, O2SATART in the last 24 hours.      O2 Device: None (Room air)  O2 Flow Rate (L/min):  [2 L/min] 2 L/min    Cardiovascular   Septic shock secondary to Metapneumovirus versus pneumonia  Presented with hypotension refractory to 2 L IV fluids.  Lactate peaked at 3.7, improved with fluids. Started on pressors.  Additional liter LR received 1/16 2/2 increasing pressor requirements.  - midodrine  09/04/14  - Off pressors.  - See ID section for full infectious workup    Hypotension- improved  Bradycardia  Autonomic Dysfunction  Sudden drops in HR correlating with drops in BP. Low TSH, total T4 low, fT4 wnl. Likely low TBG iso chronic malnutrition. Suspect downregulated sympathetic tone ISO cocaine use. EKG with sinus brady and sinus arrhythmia, no AVB.  No evidence of electrolyte disorders, hypothermia, ischemia.  Have been open unable to assess chronotropic response due to poor participation in exam, but this would support sinus node dysfunction.  Occasionally complaining of dizziness, however has horizontal nystagmus at baseline, feel this is unlikely cardiac.  - orthostatic vitals  - chronotropic response eval      Renal   AKI-resolved  Creatinine 2.13 at presentation (no history of prior renal dysfunction). Likely prerenal iso viral infection and multiple days of malaise and poor appetite. Given 2L IV fluids in the ED. UA with protein, improved Cr 0.85 (1.32).  CK not suggestive of  ongoing rhabdo  - Daily BMP    Intake/Output Summary (Last 24 hours) at 12/15/2024 9367  Last data filed at 12/15/2024 0600  Gross per 24 hour   Intake 390 ml   Output 376 ml   Net 14 ml         Infectious Disease/Autoimmune   Septic Shock  Metapneumo upper respiratory viral infection  CAP  Presented with multiple days of weakness, fevers, cough, hypotension, and leukocytosis WBC 13. Hypotension refractory to 2L IV fluids, ultimately requiring initiation of pressors. Infectious workup started in the ED due to persist hypotension and history of immunocompromise (takes Tocilizumab  for epilepsy). RPP positive for metapneumovirus. CXR with concerns for atelectasis vs aspiration, pneumonia cannot be excluded. Empirically treated with Vanc, Zosyn  in ED. Bcx NGTD, negative MRSA screen. UA not concerning for infection.  RUQ ultrasound with small amount of gallbladder sludge, no evidence of cholecystitis or ductal dilatation.  Small juxta cardiac opacity on 1/14 CXR. Completed 5 day course of CTX. HDS off pressors.     Cultures:  Blood Culture, Routine (no units)   Date Value   12/08/2024 No Growth at 5 days   12/08/2024 No Growth at 5 days     WBC (10*9/L)   Date Value   12/15/2024 4.5     WBC, UA (/HPF)   Date Value   12/09/2024 <1            FEN/GI   Failure to thrive  Patient with poor po intake, father provided that he is somnolent at baseline and has people in and out of the home, suspects they steal his food. 25lb weight loss over the past year. Overall concerning picture and may need durable feeding plan.  Patient much improved since initial presentation. No ambulatory and tolerating PO.   - Per nutrition recs on 1/16: regular diet, Ensure high protein, multivitamin, folvite , IV thiamine .    Provider Malnutrition Assessment:  Body mass index is 19.92 kg/m??.BMI Interpretation: within normal limits.  GLIM criteria:   Pt with severe muscle loss (e.g., severe temporal wasting) -- severe malnutrition  -I have screened this patient for malnutrition and they did meet criteria for malnutrition based on GLIM criteria.  -Nutrition consulted yes, see RD assessment below                 Heme/Coag   Thrombocytopenia  Platelets 66 on 1/18. No prior recorded episodes of thrombocytopenia. LFT significant for mildly elevated AST. Low concern for active hemorrhage. Consider ITP iso recent viral infection; diagnosis of exclusion. B12, Folate wnl. Labs not consistent with DIC. Mildly elevated D dimer. Likely bone marrow suppression. Platelets uptrending, 143 this AM.  - repeat CBC, smear, DIC panel  - Peripheral smear  - restart lovenox  for DVT ppx    Endocrine   C/f subclinical hypothyroidism  - On admission, TSH initially WNL at 1.761 but subsequently decreased to 0.404. Total T4 4.2 (low), free T4 1.12 (WNL). C/f subclinical hypothyroidism vs euthyroid sick syndrome.    Integumentary     #  - WOCN consulted for high risk skin assessment No. Reason: n/a.  - WOCN recs >> n/a  - cont pressure mitigating precautions per skin policy    Prophylaxis/LDA/Restraints/Consults   ICU Checklist completed:  see ICU rounding navigator      Patient Lines/Drains/Airways Status       Active Active Lines, Drains, & Airways       Name Placement date Placement time Site Days    Peripheral  IV 12/08/24 Anterior;Left Forearm 12/08/24  2154  Forearm  6    Peripheral IV 12/12/24 Left;Posterior;Proximal Forearm 12/12/24  1530  Forearm  2                  Patient Lines/Drains/Airways Status       Active Wounds       None                    Goals of Care     Code Status:   Orders Placed This Encounter   Procedures    Full Code     Standing Status:   Standing     Number of Occurrences:   1        Librarian, Academic:  Mr. Drudge designated healthcare decision maker(s) is/are   HCDM (patient stated preference): Artemis, Loyal - Father - [phone]    HCDM (patient stated preference): Jhett, Fretwell - Mother - [phone]. See HCDM section of Epic sidebar/storyboard or ACP tab in patient chart for details regarding active HCDMs and patient capacity for decision-making.      Subjective     No active complaints. Specifically denied pain. Says he overall feels better.    Objective     Vitals - past 24 hours  Temp:  [36 ??C (96.8 ??F)-36.5 ??C (97.7 ??F)] 36.1 ??C (97 ??F)  Pulse:  [43-76] 76  SpO2 Pulse:  [44-76] 76  Resp:  [11-28] 28  BP: (90-127)/(39-92) 101/54  SpO2:  [92 %-100 %] 97 % Intake/Output  I/O last 3 completed shifts:  In: 1250 [P.O.:1000; IV Piggyback:250]  Out: 800 [Urine:800]     Physical Exam:    General: Lying in bed, no acute distress, mildly somnolent.   HEENT: Ongoing horizontal nystagmus  CV: Regular rate and rhythm. No murmurs auscultated.  Pulm: CTAB. Normal respiratory effort.  GI: Non-distended, non-tender to palpation.  Skin: No rashes or wounds appreciated on clothed exam.  Neuro: Bradykinetic, more arousable. A+Ox4. No active hallucinations. Mild L-sided facial droop (baseline). Moderate dysarthria (baseline), difficult to understand. GCS 14/15 (possibly mildly confused). Stable but slow-paced gait.      Continuous Infusions:   Infusions Meds[1]    Scheduled Medications:   Scheduled Medications[2]    PRN medications:  PRN Medications[3]    Data/Imaging Review: Reviewed in Epic and personally interpreted on 12/15/2024. See EMR for detailed results.      Tiawanna Luchsinger, MD   Emergency Medicine PGY-1      G       [1]    NORepinephrine  bitartrate-NS     [2]    tocilizumab   162 mg Subcutaneous Q7 Days    cenobamate   400 mg Oral Daily    cetirizine   10 mg Oral Q AM    clonazePAM   1 mg Oral Daily    clonazePAM   2 mg Oral Nightly    divalproex  ER  1,500 mg Oral Nightly    enoxaparin  (LOVENOX ) injection  40 mg Subcutaneous Q24H    famotidine  (PEPCID ) IV  20 mg Intravenous BID    flu vac ts 2025-26(12mos up)-PF  0.5 mL Intramuscular During hospitalization    folic acid   1 mg Oral Daily    haloperidol   10 mg Oral Nightly    ipratropium-albuterol   3 mL Nebulization Q6H (RT)    lamoTRIgine   200 mg Oral BID    levocarnitine  (SF)  500 mg Oral BID    midodrine   10 mg Oral BID    midodrine   15 mg Oral Nightly    multivitamins (ADULT)  1 tablet Oral Daily    pneumococcal 21-valent (PF) vaccine  0.5 mL Intramuscular During hospitalization   [3] acetaminophen , aluminum-magnesium  hydroxide-simethicone, guaiFENesin , melatonin, OLANZapine zydis, senna

## 2024-12-15 NOTE — Plan of Care (Signed)
 Problem: Adult Inpatient Plan of Care  Goal: Absence of Hospital-Acquired Illness or Injury  Outcome: Shift Focus  Intervention: Identify and Manage Fall Risk  Recent Flowsheet Documentation  Taken 12/15/2024 0600 by Dorine Flicker, RN  Safety Interventions:   bed alarm   chair alarm   commode/urinal/bedpan at bedside   low bed   lighting adjusted for tasks/safety  Taken 12/15/2024 0400 by Dorine Flicker, RN  Safety Interventions:   bed alarm   chair alarm   commode/urinal/bedpan at bedside   low bed   lighting adjusted for tasks/safety  Taken 12/15/2024 0200 by Dorine Flicker, RN  Safety Interventions:   bed alarm   chair alarm   commode/urinal/bedpan at bedside   low bed   lighting adjusted for tasks/safety  Taken 12/15/2024 0000 by Dorine Flicker, RN  Safety Interventions:   bed alarm   chair alarm   commode/urinal/bedpan at bedside   low bed   lighting adjusted for tasks/safety  Taken 12/14/2024 2200 by Dorine Flicker, RN  Safety Interventions:   bed alarm   chair alarm   commode/urinal/bedpan at bedside   low bed   lighting adjusted for tasks/safety  Taken 12/14/2024 2000 by Dorine Flicker, RN  Safety Interventions:   bed alarm   chair alarm   commode/urinal/bedpan at bedside   low bed   lighting adjusted for tasks/safety  Intervention: Prevent Skin Injury  Recent Flowsheet Documentation  Taken 12/15/2024 0600 by Dorine Flicker, RN  Positioning for Skin: Supine/Back  Device Skin Pressure Protection:   adhesive use limited   absorbent pad utilized/changed   pressure points protected   skin-to-device areas padded   skin-to-skin areas padded   tubing/devices free from skin contact  Skin Protection:   adhesive use limited   incontinence pads utilized   pectin skin barriers applied   protective footwear used   pulse oximeter probe site changed   skin-to-device areas padded   skin-to-skin areas padded   tubing/devices free from skin contact  Taken 12/15/2024 0400 by Dorine Flicker, RN  Positioning for Skin: Right  Device Skin Pressure Protection:   adhesive use limited   absorbent pad utilized/changed   pressure points protected   skin-to-device areas padded   skin-to-skin areas padded   tubing/devices free from skin contact  Skin Protection:   adhesive use limited   incontinence pads utilized   pectin skin barriers applied   protective footwear used   pulse oximeter probe site changed   skin-to-device areas padded   skin-to-skin areas padded   tubing/devices free from skin contact  Taken 12/15/2024 0200 by Dorine Flicker, RN  Positioning for Skin: Supine/Back  Device Skin Pressure Protection:   adhesive use limited   absorbent pad utilized/changed   pressure points protected   skin-to-device areas padded   skin-to-skin areas padded   tubing/devices free from skin contact  Skin Protection:   adhesive use limited   incontinence pads utilized   pectin skin barriers applied   protective footwear used   pulse oximeter probe site changed   skin-to-device areas padded   skin-to-skin areas padded   tubing/devices free from skin contact  Taken 12/15/2024 0000 by Dorine Flicker, RN  Positioning for Skin: Right  Device Skin Pressure Protection:   adhesive use limited   absorbent pad utilized/changed   pressure points protected   skin-to-device areas padded   skin-to-skin areas padded   tubing/devices free from skin contact  Skin Protection:   adhesive use limited   incontinence pads utilized  pectin skin barriers applied   protective footwear used   pulse oximeter probe site changed   skin-to-device areas padded   skin-to-skin areas padded   tubing/devices free from skin contact  Taken 12/14/2024 2200 by Dorine Flicker, RN  Positioning for Skin: Left  Device Skin Pressure Protection:   adhesive use limited   absorbent pad utilized/changed   pressure points protected   skin-to-device areas padded   skin-to-skin areas padded   tubing/devices free from skin contact  Skin Protection:   adhesive use limited   incontinence pads utilized   pectin skin barriers applied   protective footwear used   pulse oximeter probe site changed   skin-to-device areas padded   skin-to-skin areas padded   tubing/devices free from skin contact  Taken 12/14/2024 2000 by Dorine Flicker, RN  Positioning for Skin: Supine/Back  Device Skin Pressure Protection:   adhesive use limited   absorbent pad utilized/changed   pressure points protected   skin-to-device areas padded   skin-to-skin areas padded   tubing/devices free from skin contact  Skin Protection:   adhesive use limited   incontinence pads utilized   pectin skin barriers applied   protective footwear used   pulse oximeter probe site changed   skin-to-device areas padded   skin-to-skin areas padded   tubing/devices free from skin contact  Intervention: Prevent and Manage VTE (Venous Thromboembolism) Risk  Recent Flowsheet Documentation  Taken 12/15/2024 0400 by Martinez-Flores, Flicker, RN  Anti-Embolism Device Type: SCD, Knee  Anti-Embolism Device Status: Off  Anti-Embolism Device Location: BLE  Taken 12/15/2024 0000 by Dorine Flicker, RN  Anti-Embolism Device Type: SCD, Knee  Anti-Embolism Device Status: Off  Anti-Embolism Device Location: BLE  Taken 12/14/2024 2000 by Dorine Flicker, RN  Anti-Embolism Device Type: SCD, Knee  Anti-Embolism Device Status: Off  Anti-Embolism Device Location: BLE

## 2024-12-15 NOTE — Consults (Signed)
 [facility]  Follow-Up Psychiatry Consult Note      Date of admission: 12/08/2024  9:03 PM  Service Date: December 15, 2024  Primary Team: Medical ICU (MDI)  LOS:  LOS: 6 days      Assessment:   Austin Banks is a 34 y.o. 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  Patient was seen in consultation by request of Austin Jackquline Schiller, MD for evaluation of altered mental status.     Austin Banks presents with symptoms, including visual hallucinations and delusions, consistent with his long-standing prior diagnosis of schizophrenia. Based on chart review these symptoms have been present outside of periods of substance use.  Patient's presentation is further complicated by his h/o cocaine use disorder, cannabis use disorder, and methamphetamine use disorder. UDS on admission was + for cocaine, withdrawal was possible. His sedation, now improving, is likely multifactorial in the setting of his baseline somnolence, acute illness, cocaine withdrawal, and chronic neurologic conditions. Pt has been on Haldol  15 mg daily for the past few years. Though possible, it's not likely to be the major contributor to his initial presentation. We recommend increasing to Haldol  at 15 mg nightly, the patient's home dose. We will continue to follow this patient to monitor response to this medication change and make further adjustments and recommendations as needed.     Diagnoses:   Active Hospital problems:  Principal Problem:    Shock, unspecified    (CMS-HCC)  Active Problems:    Paranoid schizophrenia    (CMS-HCC)    Seizures    (CMS-HCC)    Septic shock    (CMS-HCC) Upper respiratory infection, viral    Infection due to human metapneumovirus (hMPV)       Problems edited/added by me:  No problems updated.    Risk Assessment:  ASQ screening result: not completed    -A full risk assessment was previously performed on 12/14/2024.  Risk assessment remains essentially unchanged.    Current suicide risk: low risk  Current homicide risk: low risk    Recommendations:     Safety and Observation Level:   -- This patient is not currently under IVC. If safety concerns arise, please page psychiatry for an evaluation. Recommend routine level of observation per primary team.    Medications:  -- Increase Haldol  po to 15 mg nightly.   -- Continue Thiamine  200 mg IV Q8H x 5 days.  -- Continue home antiepileptic regimen as outlined by neurology.     Further Work-up:   -- As discussed with primary team, have a low threshold to repeat head imaging if facial asymmetry new/worsening from baseline    Behavioral / Environmental:   -- Although not currently delirious, the patient is at an elevated risk for developing delirium. Please utilize delirium prevention protocol.  -- Please order Delirium (prevention) protocol: the following can be copied into a single misc nursing order.        - RN to open blinds every morning.        - To bedside: glasses, hearing aide, patient's own shoes. Make available to patient's when possible and encourage use.        - RN  to assess orientation (person, place, & time) qam and prn, with frequent reorientation (verbal & whiteboard) & introduction of caregivers.           - Recommend extended visiting hours with familiar family/friends as feasible.        - Encourage normal sleep-wake cycle by promoting a dark, quiet environment at night and stimulating, light environment during the day.          - Turn the TV off when patient is asleep or not in use.    Follow-up:  -- When patient is discharged, please ensure that their AVS includes information about the 25 Suicide & Crisis Lifeline.  -- The patient will follow-up with his ACT team for mental health care at the time of discharge.  -- We will follow as needed at this time.     Thank you for this consult request. Recommendations have been communicated to the primary team. Please page [phone] (adult psychiatry consults) for any questions or concerns.     Discussed with and seen by Fellow, Austin Ellen, MD  Discussed with and seen by Attending, Austin Spinner, MD, who agrees with the assessment and plan.    Austin LITTIE Chang, MD    Subjective     Relevant events since last seen by psychiatry: None    Patient Interview:  On approach for interview patient is resting in chair next to bedside watching TV. Much less drowsy than previously noted. He states his mood is good. Willing to cooperate with orientation and attention testing; Ox4 and able to name MOYB with 1 error (forgot march). He denies visual hallucinations and auditory hallucinations, improved from prior VH of wings on the back of people. Does not endorse prior grandiose hallucination. Denies SI, HI. Patient denies hearing any voices while in the hospital, feels well-treated by staff.     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     ROS:   All systems reviewed as negative/unremarkable aside from the following pertinent positives and negatives: None    Collateral:   - Reviewed medical records in Epic  - Austin Banks ACT but no one answered.   - Called father who reported he does not believe patient is safe to go home for 2 main reasons: gait instability/weakness/in ability to walk at home and concerns over substance use at home. He also reported that there are potential concerns for medication with adherence with uncertainty if Austin Banks is missing doses at home.   - Spoke with mother 12/15/24 who agrees with the above. She provided the number for ACT: Austin Banks [phone]. NP Austin Banks: [phone]. Austin Banks:Act supervisor [phone]. Mother is working with [employer] to see if there are additional support for patient.     Relevant Updates to past psychiatric, medical/surgical, family, or social history: NA    Current Medications:  Scheduled Meds:Scheduled Medications[1]  Continuous Infusions:Infusions Meds[2]  PRN Meds:.PRN Medications[3]    Objective:   Vital signs:   Temp:  [36.1 ??C (97 ??F)-36.6 ??C (97.9 ??F)] 36.6 ??C (97.9 ??F)  Pulse:  [44-80] 47  SpO2 Pulse:  [45-80] 51  Resp:  [11-28] 24  BP: (84-138)/(26-84) 101/51  MAP (mmHg):  [41-101] 67  SpO2:  [87 %-100 %] 87 %    Physical Exam:  Gen: No acute distress. somnolent  Pulm: Normal work of breathing.  Neuro/MSK: Tone normal. Extraocular movements intact. (+) horizontal nystagmus. Face is asymmetric at rest (left eyelid drooping  slightly more than right). Left eyebrow does not raise as high as the right. Smile/frown even BL. Hearing intact to conversation. Normal bulk/tone. Bulk is normal in all four extremities. Moving all extremities equally and spontaneously. Gait and station deferred given clinical.  Skin: warm and dry.    Mental Status Exam:  Appearance:  appears stated age, ill-appearing, and in bed   Attitude:   calm, cooperative   Behavior/Psychomotor:  appropriate eye contact and no abnormal movements   Speech/Language:   normal rate, not pressured, reduced volume, normal fluency. normal articulation   Mood:  ???good???   Affect:  mood congruent and decreased range (constricted)   Thought process:  logical, linear, clear, coherent, goal directed   Thought content:    (-) delusions. Denies SI, HI, No VH, AH.    Perceptual disturbances:   visual hallucinations of wings on people's backs   Attention/concentration:  able to attend to interview without fluctuations in consciousness and on months of the year backwards, performs with error (forgot march)   Orientation:  Oriented to person, place, day, month, year, and situation.   Memory:  not formally tested, but grossly intact   Fund of knowledge:   not formally assessed   Insight:    Impaired   Judgment:   Fair   Impulse Control:  Fair     Relevant laboratory/imaging data was reviewed.    Additional Psychometric Testing:  Not applicable.    Time-based billing disclaimer:  I personally spent 100   minutes face-to-face and non-face-to-face in the care of this patient, which includes all pre, intra, and post visit time on the date of service.  All documented time was specific to the E/M visit and does not include any procedures that may have been performed.Note initiated by Herlene Hilt MS3             [1]    tocilizumab   162 mg Subcutaneous Q7 Days    cenobamate   400 mg Oral Daily    cetirizine   10 mg Oral Q AM    clonazePAM   1 mg Oral Daily    clonazePAM   2 mg Oral Nightly    divalproex  ER  1,500 mg Oral Nightly    enoxaparin  (LOVENOX ) injection  40 mg Subcutaneous Q24H    famotidine  (PEPCID ) IV  20 mg Intravenous BID    flu vac ts 2025-26(24mos up)-PF  0.5 mL Intramuscular During hospitalization    folic acid   1 mg Oral Daily    haloperidol   15 mg Oral Nightly    ipratropium-albuterol   3 mL Nebulization Q6H (RT)    lamoTRIgine   200 mg Oral BID    levocarnitine  (SF)  500 mg Oral BID    midodrine   10 mg Oral BID    midodrine   15 mg Oral Nightly    multivitamins (ADULT)  1 tablet Oral Daily    pneumococcal 21-valent (PF) vaccine  0.5 mL Intramuscular During hospitalization    thiamine  mononitrate (vit B1)  100 mg Oral Daily   [2] [3] acetaminophen , aluminum-magnesium  hydroxide-simethicone, guaiFENesin , melatonin, OLANZapine zydis, senna

## 2024-12-16 LAB — CBC
HEMATOCRIT: 38.8 % — ABNORMAL LOW (ref 39.0–48.0)
HEMOGLOBIN: 13.3 g/dL (ref 12.9–16.5)
MEAN CORPUSCULAR HEMOGLOBIN CONC: 34.3 g/dL (ref 32.0–36.0)
MEAN CORPUSCULAR HEMOGLOBIN: 32.4 pg (ref 25.9–32.4)
MEAN CORPUSCULAR VOLUME: 94.3 fL (ref 77.6–95.7)
MEAN PLATELET VOLUME: 8.2 fL (ref 6.8–10.7)
PLATELET COUNT: 206 10*9/L (ref 150–450)
RED BLOOD CELL COUNT: 4.12 10*12/L — ABNORMAL LOW (ref 4.26–5.60)
RED CELL DISTRIBUTION WIDTH: 13.2 % (ref 12.2–15.2)
WBC ADJUSTED: 4.6 10*9/L (ref 3.6–11.2)

## 2024-12-16 LAB — BASIC METABOLIC PANEL
ANION GAP: 9 mmol/L (ref 5–14)
BLOOD UREA NITROGEN: 6 mg/dL — ABNORMAL LOW (ref 9–23)
BUN / CREAT RATIO: 6
CALCIUM: 8.7 mg/dL (ref 8.7–10.4)
CHLORIDE: 108 mmol/L — ABNORMAL HIGH (ref 98–107)
CO2: 26 mmol/L (ref 20.0–31.0)
CREATININE: 1.06 mg/dL (ref 0.73–1.18)
EGFR CKD-EPI (2021) MALE: >90 mL/min/1.73m2 (ref >=60)
GLUCOSE RANDOM: 84 mg/dL (ref 70–179)
POTASSIUM: 4.6 mmol/L (ref 3.4–4.8)
SODIUM: 143 mmol/L (ref 135–145)

## 2024-12-16 LAB — MAGNESIUM: MAGNESIUM: 2 mg/dL (ref 1.6–2.6)

## 2024-12-16 LAB — VITAMIN A AND VITAMIN E
VITAMIN A: 45 ug/dL
VITAMIN E LEVEL: 5.9 mg/L

## 2024-12-16 LAB — COPPER, SERUM: COPPER: 59 ug/dL — ABNORMAL LOW

## 2024-12-16 LAB — MANGANESE: MANGANESE, BLOOD: 0.6 ng/mL

## 2024-12-16 MED ADMIN — levocarnitine (CARNITOR) (SF) oral solution: 500 mg | ORAL | @ 13:00:00

## 2024-12-16 MED ADMIN — levocarnitine (CARNITOR) (SF) oral solution: 500 mg | ORAL | @ 02:00:00

## 2024-12-16 MED ADMIN — midodrine (PROAMATINE) tablet 10 mg: 10 mg | ORAL | @ 11:00:00

## 2024-12-16 MED ADMIN — midodrine (PROAMATINE) tablet 10 mg: 10 mg | ORAL | @ 19:00:00

## 2024-12-16 MED ADMIN — clonazePAM (KlonoPIN) tablet 1 mg: 1 mg | ORAL | @ 13:00:00

## 2024-12-16 MED ADMIN — ipratropium-albuterol (DUO-NEB) 0.5-2.5 mg/3 mL nebulizer solution 3 mL: 3 mL | RESPIRATORY_TRACT | @ 03:00:00 | Stop: 2024-12-16

## 2024-12-16 MED ADMIN — midodrine (PROAMATINE) tablet 15 mg: 15 mg | ORAL | @ 02:00:00

## 2024-12-16 MED ADMIN — cetirizine (ZYRTEC) tablet 10 mg: 10 mg | ORAL | @ 13:00:00

## 2024-12-16 MED ADMIN — haloperidol (HALDOL) tablet 15 mg: 15 mg | ORAL | @ 02:00:00 | NDC 72603023001

## 2024-12-16 MED ADMIN — enoxaparin (LOVENOX) syringe 40 mg: 40 mg | SUBCUTANEOUS | @ 13:00:00

## 2024-12-16 MED ADMIN — folic acid (FOLVITE) tablet 1 mg: 1 mg | ORAL | @ 13:00:00

## 2024-12-16 MED ADMIN — cenobamate (XCOPRI) tablet 400 mg: 400 mg | ORAL | @ 13:00:00 | NDC 71699005099

## 2024-12-16 MED ADMIN — lamoTRIgine (LaMICtal) tablet 200 mg: 200 mg | ORAL | @ 02:00:00

## 2024-12-16 MED ADMIN — lamoTRIgine (LaMICtal) tablet 200 mg: 200 mg | ORAL | @ 13:00:00

## 2024-12-16 MED ADMIN — famotidine (PF) (PEPCID) injection 20 mg: 20 mg | INTRAVENOUS | @ 13:00:00

## 2024-12-16 MED ADMIN — famotidine (PF) (PEPCID) injection 20 mg: 20 mg | INTRAVENOUS | @ 02:00:00

## 2024-12-16 MED ADMIN — clonazePAM (KlonoPIN) tablet 2 mg: 2 mg | ORAL | @ 02:00:00

## 2024-12-16 MED ADMIN — multivitamins, therapeutic with minerals tablet 1 tablet: 1 | ORAL | @ 13:00:00

## 2024-12-16 MED ADMIN — haloperidol LACTATE (HALDOL) 5 mg/mL injection: INTRAVENOUS | @ 22:00:00 | Stop: 2024-12-16

## 2024-12-16 MED ADMIN — thiamine mononitrate (vit B1) tablet 100 mg: 100 mg | ORAL | @ 13:00:00 | Stop: 2024-12-22

## 2024-12-16 MED ADMIN — divalproex ER (DEPAKOTE ER) extended released 24 hr tablet 1,500 mg: 1500 mg | ORAL | @ 02:00:00

## 2024-12-16 MED ADMIN — haloperidol LACTATE (HALDOL) injection 2 mg: 2 mg | INTRAVENOUS | @ 22:00:00 | Stop: 2024-12-16

## 2024-12-16 NOTE — Plan of Care (Signed)
 Problem: Adult Inpatient Plan of Care  Goal: Absence of Hospital-Acquired Illness or Injury  Outcome: Shift Focus  Intervention: Identify and Manage Fall Risk  Recent Flowsheet Documentation  Taken 12/16/2024 0600 by Dorine Flicker, RN  Safety Interventions:   bed alarm   chair alarm   commode/urinal/bedpan at bedside   low bed   lighting adjusted for tasks/safety  Taken 12/16/2024 0400 by Dorine Flicker, RN  Safety Interventions:   bed alarm   chair alarm   commode/urinal/bedpan at bedside   low bed   lighting adjusted for tasks/safety  Taken 12/16/2024 0200 by Dorine Flicker, RN  Safety Interventions:   bed alarm   chair alarm   commode/urinal/bedpan at bedside   low bed   lighting adjusted for tasks/safety  Taken 12/16/2024 0000 by Dorine Flicker, RN  Safety Interventions:   bed alarm   chair alarm   commode/urinal/bedpan at bedside   low bed   lighting adjusted for tasks/safety  Taken 12/15/2024 2200 by Dorine Flicker, RN  Safety Interventions:   low bed   aspiration precautions   bed alarm  Taken 12/15/2024 2000 by Dorine Flicker, RN  Safety Interventions:   bed alarm   chair alarm   commode/urinal/bedpan at bedside   low bed   lighting adjusted for tasks/safety  Intervention: Prevent Skin Injury  Recent Flowsheet Documentation  Taken 12/16/2024 0600 by Dorine Flicker, RN  Positioning for Skin: Supine/Back  Device Skin Pressure Protection:   adhesive use limited   absorbent pad utilized/changed   pressure points protected   skin-to-device areas padded   skin-to-skin areas padded   tubing/devices free from skin contact  Skin Protection:   adhesive use limited   incontinence pads utilized   pectin skin barriers applied   protective footwear used   pulse oximeter probe site changed   skin-to-device areas padded   skin-to-skin areas padded   tubing/devices free from skin contact  Taken 12/16/2024 0400 by Dorine Flicker, RN  Positioning for Skin: Right  Device Skin Pressure Protection:   adhesive use limited   absorbent pad utilized/changed   pressure points protected   skin-to-device areas padded   skin-to-skin areas padded   tubing/devices free from skin contact  Skin Protection:   adhesive use limited   incontinence pads utilized   pectin skin barriers applied   protective footwear used   pulse oximeter probe site changed   skin-to-device areas padded   skin-to-skin areas padded   tubing/devices free from skin contact  Taken 12/16/2024 0200 by Dorine Flicker, RN  Positioning for Skin: Supine/Back  Device Skin Pressure Protection:   adhesive use limited   absorbent pad utilized/changed   pressure points protected   skin-to-device areas padded   skin-to-skin areas padded   tubing/devices free from skin contact  Skin Protection:   adhesive use limited   incontinence pads utilized   pectin skin barriers applied   protective footwear used   pulse oximeter probe site changed   skin-to-device areas padded   skin-to-skin areas padded   tubing/devices free from skin contact  Taken 12/16/2024 0000 by Dorine Flicker, RN  Positioning for Skin: Left  Device Skin Pressure Protection:   adhesive use limited   absorbent pad utilized/changed   pressure points protected   skin-to-device areas padded   skin-to-skin areas padded   tubing/devices free from skin contact  Skin Protection:   adhesive use limited   incontinence pads utilized   pectin skin barriers applied   protective footwear used  pulse oximeter probe site changed   skin-to-device areas padded   skin-to-skin areas padded   tubing/devices free from skin contact  Taken 12/15/2024 2200 by Dorine Flicker, RN  Positioning for Skin: Left  Device Skin Pressure Protection:   absorbent pad utilized/changed   skin-to-skin areas padded   skin-to-device areas padded   pressure points protected   positioning supports utilized   tubing/devices free from skin contact  Skin Protection:   incontinence pads utilized   protective footwear used   silicone foam dressing in place  Taken 12/15/2024 2000 by Dorine Flicker, RN  Positioning for Skin: Right  Device Skin Pressure Protection:   adhesive use limited   absorbent pad utilized/changed   pressure points protected   skin-to-device areas padded   skin-to-skin areas padded   tubing/devices free from skin contact  Skin Protection:   adhesive use limited   incontinence pads utilized   pectin skin barriers applied   protective footwear used   pulse oximeter probe site changed   skin-to-device areas padded   skin-to-skin areas padded   tubing/devices free from skin contact  Intervention: Prevent and Manage VTE (Venous Thromboembolism) Risk  Recent Flowsheet Documentation  Taken 12/16/2024 0400 by Martinez-Flores, Flicker, RN  Anti-Embolism Device Type: SCD, Knee  Anti-Embolism Device Status: Off  Anti-Embolism Device Location: BLE  Taken 12/16/2024 0000 by Dorine Flicker, RN  Anti-Embolism Device Type: SCD, Knee  Anti-Embolism Device Status: Off  Anti-Embolism Device Location: BLE  Taken 12/15/2024 2200 by Dorine Flicker, RN  Anti-Embolism Device Status: On  Anti-Embolism Device Location: BLE  Taken 12/15/2024 2000 by Dorine Flicker, RN  Anti-Embolism Device Type: SCD, Knee  Anti-Embolism Device Status: Off  Anti-Embolism Device Location: BLE  Intervention: Prevent Infection  Recent Flowsheet Documentation  Taken 12/15/2024 2200 by Dorine Flicker, RN  Infection Prevention:   hand hygiene promoted   personal protective equipment utilized   equipment surfaces disinfected
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
## 2024-12-16 NOTE — Plan of Care (Incomplete)
 Problem: Infection  Goal: Absence of Infection Signs and Symptoms  Outcome: Shift Focus     Problem: Fall Injury Risk  Goal: Absence of Fall and Fall-Related Injury  Outcome: Shift Focus     Problem: Self-Care Deficit  Goal: Improved Ability to Complete Activities of Daily Living  Outcome: Shift Focus     Problem: Skin Injury Risk Increased  Goal: Skin Health and Integrity  Outcome: Shift Focus  Intervention: Optimize Skin Protection  Recent Flowsheet Documentation  Taken 12/16/2024 2000 by Carollynn Ruff, RN  Head of Bed [facility]) Positioning: HOB at 30-45 degrees     Problem: Malnutrition  Goal: Improved Nutritional Intake  Outcome: Shift Focus

## 2024-12-16 NOTE — Progress Notes (Signed)
 MICU Daily Progress Note     Date of Service: 12/16/2024    Problem List:   Principal Problem:    Shock, unspecified    (CMS-HCC)  Active Problems:    Paranoid schizophrenia    (CMS-HCC)    Seizures    (CMS-HCC)    Septic shock    (CMS-HCC)    Upper respiratory infection, viral    Infection due to human metapneumovirus (hMPV)      Interval history: Austin Banks is a 34 y.o. 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 He was admitted to the MICU on 1/14 for hypotension and ongoing pressor requirements. As of today, hemodynamically stable off pressors. Stable for stepdown/floor status.      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 No witnessed seizure activity at home. Home anti-epileptics of Depakote , Xcopri , Lamictal , Tocilizumab , Klonopin , and Nayzilam  for rescue. Patient reports frequently missing doses of antiepileptic medications. Reports multiple falls without head strike or LOC. CT head negative for acute intracranial process. EEG without discharges, not concerning for seizure activity. MRI with/without shows diffuse cortical and cerebellar atrophy advanced for pt's age, but no acute findings. Therapeutic on valproate.  Neurology service feels this level of somnolence is baseline.  Klonopin  has tried to be weaned in the past with more seizure-like activity.  More alert this morning after holding nightly Haldol .  Regularly has 1-3 nonconvulsive seizures per week and at least 1 GTC every 1 to 3 months.  ISO heavy cocaine use, question whether this could be cocaine withdrawal.  Could be a reactivation, on tocilizumab , but query whether the patient is taking given his baseline functional status, living alone.  With ongoing viral infection, ADEM also remains on the differential. Family called, asked to bring in tocilizumab . Able to ambulate 60ft with RW. PMR wondering about participation with [facility], feel subacute rehab wound be better suited for patient.  Brain MRI W Wo contrast without acute intracranial abnormality, advanced diffuse cortical and cerebellar atrophy.  This finding likely correlates with alcohol consumption, recurrent seizures on multiple antiepileptics  - Neurology consulted, signed off after no evidence of seizure on EEG  - Follow-up Lamictal  level  - Continue home antiepileptic regimen (depakote , klonopin , lamictal , cenobamate )  - Continue home tocilizumab  every 7 days.  - TSH low, fT4 1.12 (normal). Subclinical hypothyroidism vs euthyroid sick syndrome.  - RPR nonreactive, HIV non-reactive     Hx polysubstance use  Nystagmus  Documented history of crack cocaine, tobacco, methamphetamine, and marijuana use. Urine tox in ED positive for amphetamines and cocaine. Negative ethanol on admission.  Presented with elevated AST. Smoked and snorted amphetamines prior to admission. Parents at bedside are aware of his substance use. Unclear how much etoh or if benzo/barb use present. Nystagmus present 1/15. No other signs of acute etoh withdrawal  - discontinue CIWA  - psychiatry consult     Dysmetria  Altered Mental Status  Patient has been lethargic, somnolent, and globally slow to respond since admission. His responsiveness has improved since this morning. Parents noted he was having difficulty eating with a fork, frequently  bringing food to the wrong part of his face. C/f low vision. CT head 1/13 showed mild brain volume loss of the cerebellum, a new finding compared to prior imaging in 01/2024. Bedside cerebellar exam could not be completed due to lethargy, somnolence. Pupils are small but equal and reactive to light. Folate/b12 wnl. Previously taking thiamine  supplement, stopped earlier this year. Lower BMI, c/f chronic malnut. Question reasoning for antipsychotic use  - IV thiamine  200mg  x5d, then po 100mg  daily  - Mg repleted  - Neurology following, appreciate recs  - Continue to monitor with daily cerebellar, eye exam  - Serum ammonia wnl  - PT/OT    Schizophrenia  Hx mania  Per chart review, patient with longstanding history of schizophrenia previously on dual antipsychotic therapy with Zyprexa and Risperdal .  In 2015, presented with manic episode with psychotic features.  Zyprexa discontinued out of concern it was lowering seizure threshold.  Longstanding history of medication noncompliance and heavy recreational drug use. Initially quite somnolent, requiring sternal rub to wake up, active AVM. Psych following and have titrated nightly oral haldol  to 15 mg, pt's home dose. Greatly improving mental exam over the past 48 hours. No awake, alert, GCS 15, no AVH, more clear speech, normal ambulation.  - psychiatry following. Continue Haldol  15 mg PO HS.      Analgesia: Pain adequately controlled  RASS at goal? N/A, not on sedation  [place] Agitation Assessment Scale (RASS) : 0 (12/16/2024  6:00 AM)       Pulmonary   Viral upper respiratory infection, Metapneumovirus +  Presented with 2-3 days of general malaise, fevers, and productive cough. On arrival to ED patient had refractory hypotension requiring pressors (see below) and leukocytosis with WBC 13. CXR appears to be consistent with viral infection, however read is unable to exclude pneumonia.  Completed 5 day course of CTX on 1/20. Completed duonebs. Given flu and PCV vaccinations.  - continues on RA    No results for input(s): SPECTYPEART, PHART, PCO2ART, PO2ART, HCO3ART, BEART, O2SATART in the last 24 hours.      O2 Device: None (Room air)    Cardiovascular   Septic shock secondary to Metapneumovirus versus pneumonia  Presented with hypotension refractory to 2 L IV fluids.  Lactate peaked at 3.7, improved with fluids. Started on pressors.  Additional liter LR received 1/16 2/2 increasing pressor requirements. Now off pressors, tolerating PO.    - MAPs ranged from 51-80 overnight, which is not atypical for patient. This morning, 124/79.   - midodrine  09/04/14  - Off pressors.  - See ID section for full infectious workup    Hypotension- improved  Bradycardia  Autonomic Dysfunction  Sudden drops in HR correlating with drops in BP. Low TSH, total T4 low, fT4 wnl. Likely low TBG iso chronic malnutrition. Suspect downregulated sympathetic tone ISO cocaine use. EKG with sinus brady and sinus arrhythmia, no AVB.  No evidence of electrolyte disorders, hypothermia, ischemia.  Have been open unable to assess chronotropic response due to poor participation in exam, but this would support sinus node dysfunction.  Occasionally complaining of dizziness, however has horizontal nystagmus at baseline, feel this is unlikely cardiac.  - orthostatic vitals  - chronotropic response eval      Renal   AKI-resolved  Creatinine 2.13 at presentation (no history of prior renal dysfunction). Likely prerenal iso viral infection and multiple days of malaise and poor appetite. Given 2L IV fluids in the ED. UA with protein.  CK not suggestive of ongoing  rhabdo. Creatinine 0.92 today. Good urine output.  - Daily BMP    Intake/Output Summary (Last 24 hours) at 12/16/2024 0825  Last data filed at 12/16/2024 0700  Gross per 24 hour   Intake 240 ml   Output 1275 ml   Net -1035 ml         Infectious Disease/Autoimmune   Septic Shock  Metapneumo upper respiratory viral infection  CAP  Presented with multiple days of weakness, fevers, cough, hypotension, and leukocytosis WBC 13. Hypotension refractory to 2L IV fluids, ultimately requiring initiation of pressors. Infectious workup started in the ED due to persist hypotension and history of immunocompromise (takes Tocilizumab  for epilepsy). RPP positive for metapneumovirus. CXR with concerns for atelectasis vs aspiration, pneumonia cannot be excluded. Empirically treated with Vanc, Zosyn  in ED. Bcx NGTD, negative MRSA screen. UA not concerning for infection.  RUQ ultrasound with small amount of gallbladder sludge, no evidence of cholecystitis or ductal dilatation.  Small juxta cardiac opacity on 1/14 CXR. Completed 5 day course of CTX. HDS off pressors.     Cultures:  Blood Culture, Routine (no units)   Date Value   12/08/2024 No Growth at 5 days   12/08/2024 No Growth at 5 days     WBC (10*9/L)   Date Value   12/16/2024 4.6     WBC, UA (/HPF)   Date Value   12/09/2024 <1            FEN/GI   Failure to thrive  Patient with poor po intake, father provided that he is somnolent at baseline and has people in and out of the home, suspects they steal his food. 25lb weight loss over the past year. Overall concerning picture and may need durable feeding plan.  Patient much improved since initial presentation. No ambulatory and tolerating PO.   - Per nutrition recs on 1/16: regular diet, Ensure high protein, multivitamin, folvite , IV thiamine .    Provider Malnutrition Assessment:  Body mass index is 19.92 kg/m??.BMI Interpretation: within normal limits.  GLIM criteria:   Pt with severe muscle loss (e.g., severe temporal wasting) -- severe malnutrition  -I have screened this patient for malnutrition and they did meet criteria for malnutrition based on GLIM criteria.  -Nutrition consulted yes, see RD assessment below                 Heme/Coag   Thrombocytopenia  Platelets 66 on 1/18. No prior recorded episodes of thrombocytopenia. LFT significant for mildly elevated AST. Low concern for active hemorrhage. Consider ITP iso recent viral infection; diagnosis of exclusion. B12, Folate wnl. Labs not consistent with DIC. Mildly elevated D dimer. Likely bone marrow suppression. Platelets uptrending, 206 this AM.  - AM CBC  - restart lovenox  for DVT ppx    Endocrine   C/f subclinical hypothyroidism  - On admission, TSH initially WNL at 1.761 but subsequently decreased to 0.404. Total T4 4.2 (low), free T4 1.12 (WNL). C/f subclinical hypothyroidism vs euthyroid sick syndrome.    Integumentary     #  - WOCN consulted for high risk skin assessment No. Reason: n/a.  - WOCN recs >> n/a  - cont pressure mitigating precautions per skin policy    Prophylaxis/LDA/Restraints/Consults   ICU Checklist completed:  see ICU rounding navigator      Patient Lines/Drains/Airways Status       Active Active Lines, Drains, & Airways       Name Placement date Placement time Site Days    Peripheral IV 12/08/24  Anterior;Left Forearm 12/08/24  2154  Forearm  7    Peripheral IV 12/12/24 Left;Posterior;Proximal Forearm 12/12/24  1530  Forearm  3                  Patient Lines/Drains/Airways Status       Active Wounds       None                    Goals of Care     Code Status:   Orders Placed This Encounter   Procedures    Full Code     Standing Status:   Standing     Number of Occurrences:   1        Librarian, Academic:  Mr. Laminack designated healthcare decision maker(s) is/are   HCDM (patient stated preference): Brahm, Barbeau - Father - [phone]    HCDM (patient stated preference): Jaquay, Posthumus - Mother - [phone]. See HCDM section of Epic sidebar/storyboard or ACP tab in patient chart for details regarding active HCDMs and patient capacity for decision-making.      Subjective     Feels much better. No longer dizzy upon standing. Feels ready to go home. Excited about snow forecast this upcoming weekend. No AVH.    Objective     Vitals - past 24 hours  Temp:  [36.5 ??C (97.7 ??F)-36.6 ??C (97.9 ??F)] 36.6 ??C (97.9 ??F)  Pulse:  [46-85] 46  SpO2 Pulse:  [49-80] 49  Resp:  [12-24] 18  BP: (77-138)/(26-99) 77/44  SpO2:  [87 %-100 %] 99 % Intake/Output  I/O last 3 completed shifts:  In: 590 [P.O.:590]  Out: 1451 [Urine:1451] Physical Exam:    General: Lying in bed, no acute distress, alert. Polite and interactive.  HEENT: Ongoing horizontal nystagmus  CV: Regular rate and rhythm. No murmurs auscultated.  Pulm: CTAB. Normal respiratory effort.  GI: Non-distended, non-tender to palpation.  Skin: No rashes or wounds appreciated on clothed exam.  Neuro: Bradykinetic, more arousable. A+Ox4. No active hallucinations. Mild L-sided facial droop (baseline). Very mild dysarthria, improved from yesterday. GCS 15. Stable gait.    Continuous Infusions:   Infusions Meds[1]    Scheduled Medications:   Scheduled Medications[2]    PRN medications:  PRN Medications[3]    Data/Imaging Review: Reviewed in Epic and personally interpreted on 12/16/2024. See EMR for detailed results.      Mariany Mackintosh, MD   Emergency Medicine PGY-1          [1] [2]    tocilizumab   162 mg Subcutaneous Q7 Days    cenobamate   400 mg Oral Daily    cetirizine   10 mg Oral Q AM    clonazePAM   1 mg Oral Daily    clonazePAM   2 mg Oral Nightly    divalproex  ER  1,500 mg Oral Nightly    enoxaparin  (LOVENOX ) injection  40 mg Subcutaneous Q24H    famotidine  (PEPCID ) IV  20 mg Intravenous BID    flu vac ts 2025-26(16mos up)-PF  0.5 mL Intramuscular During hospitalization    folic acid   1 mg Oral Daily    haloperidol   15 mg Oral Nightly    ipratropium-albuterol   3 mL Nebulization Q6H (RT)    lamoTRIgine   200 mg Oral BID    levocarnitine  (SF)  500 mg Oral BID    midodrine   10 mg Oral BID    midodrine   15 mg Oral Nightly    multivitamins (ADULT)  1 tablet Oral Daily  pneumococcal 21-valent (PF) vaccine  0.5 mL Intramuscular During hospitalization    thiamine  mononitrate (vit B1)  100 mg Oral Daily   [3] acetaminophen , aluminum-magnesium  hydroxide-simethicone, guaiFENesin , melatonin, OLANZapine zydis, senna

## 2024-12-17 LAB — VITAMIN B6: VITAMIN B6: 2 ug/L — ABNORMAL LOW

## 2024-12-17 LAB — BIOTINIDASE LEVEL: BIOTINIDASE: 5.6 U/L

## 2024-12-17 LAB — BASIC METABOLIC PANEL
ANION GAP: 7 mmol/L (ref 5–14)
BLOOD UREA NITROGEN: 9 mg/dL (ref 9–23)
BUN / CREAT RATIO: 9
CALCIUM: 9 mg/dL (ref 8.7–10.4)
CHLORIDE: 105 mmol/L (ref 98–107)
CO2: 25 mmol/L (ref 20.0–31.0)
CREATININE: 0.98 mg/dL (ref 0.73–1.18)
EGFR CKD-EPI (2021) MALE: >90 mL/min/1.73m2 (ref >=60)
GLUCOSE RANDOM: 86 mg/dL (ref 70–179)
POTASSIUM: 4.4 mmol/L (ref 3.4–4.8)
SODIUM: 137 mmol/L (ref 135–145)

## 2024-12-17 LAB — CBC
HEMATOCRIT: 38.5 % — ABNORMAL LOW (ref 39.0–48.0)
HEMOGLOBIN: 13.1 g/dL (ref 12.9–16.5)
MEAN CORPUSCULAR HEMOGLOBIN CONC: 34.1 g/dL (ref 32.0–36.0)
MEAN CORPUSCULAR HEMOGLOBIN: 32.3 pg (ref 25.9–32.4)
MEAN CORPUSCULAR VOLUME: 94.6 fL (ref 77.6–95.7)
MEAN PLATELET VOLUME: 7.9 fL (ref 6.8–10.7)
PLATELET COUNT: 239 10*9/L (ref 150–450)
RED BLOOD CELL COUNT: 4.07 10*12/L — ABNORMAL LOW (ref 4.26–5.60)
RED CELL DISTRIBUTION WIDTH: 13.1 % (ref 12.2–15.2)
WBC ADJUSTED: 4.7 10*9/L (ref 3.6–11.2)

## 2024-12-17 LAB — MAGNESIUM: MAGNESIUM: 1.8 mg/dL (ref 1.6–2.6)

## 2024-12-17 MED ADMIN — levocarnitine (CARNITOR) (SF) oral solution: 500 mg | ORAL | @ 01:00:00

## 2024-12-17 MED ADMIN — levocarnitine (CARNITOR) (SF) oral solution: 500 mg | ORAL | @ 16:00:00

## 2024-12-17 MED ADMIN — midodrine (PROAMATINE) tablet 10 mg: 10 mg | ORAL | @ 10:00:00

## 2024-12-17 MED ADMIN — midodrine (PROAMATINE) tablet 10 mg: 10 mg | ORAL | @ 20:00:00

## 2024-12-17 MED ADMIN — clonazePAM (KlonoPIN) tablet 1 mg: 1 mg | ORAL | @ 15:00:00

## 2024-12-17 MED ADMIN — midodrine (PROAMATINE) tablet 15 mg: 15 mg | ORAL | @ 01:00:00

## 2024-12-17 MED ADMIN — cetirizine (ZYRTEC) tablet 10 mg: 10 mg | ORAL | @ 15:00:00

## 2024-12-17 MED ADMIN — haloperidol (HALDOL) tablet 15 mg: 15 mg | ORAL | @ 01:00:00 | NDC 72603023001

## 2024-12-17 MED ADMIN — enoxaparin (LOVENOX) syringe 40 mg: 40 mg | SUBCUTANEOUS | @ 15:00:00

## 2024-12-17 MED ADMIN — folic acid (FOLVITE) tablet 1 mg: 1 mg | ORAL | @ 15:00:00

## 2024-12-17 MED ADMIN — cenobamate (XCOPRI) tablet 400 mg: 400 mg | ORAL | @ 15:00:00 | NDC 71699005099

## 2024-12-17 MED ADMIN — lamoTRIgine (LaMICtal) tablet 200 mg: 200 mg | ORAL | @ 01:00:00

## 2024-12-17 MED ADMIN — lamoTRIgine (LaMICtal) tablet 200 mg: 200 mg | ORAL | @ 14:00:00

## 2024-12-17 MED ADMIN — famotidine (PF) (PEPCID) injection 20 mg: 20 mg | INTRAVENOUS | @ 01:00:00

## 2024-12-17 MED ADMIN — famotidine (PF) (PEPCID) injection 20 mg: 20 mg | INTRAVENOUS | @ 14:00:00

## 2024-12-17 MED ADMIN — clonazePAM (KlonoPIN) tablet 2 mg: 2 mg | ORAL | @ 01:00:00

## 2024-12-17 MED ADMIN — multivitamins, therapeutic with minerals tablet 1 tablet: 1 | ORAL | @ 15:00:00

## 2024-12-17 MED ADMIN — thiamine mononitrate (vit B1) tablet 100 mg: 100 mg | ORAL | @ 15:00:00 | Stop: 2024-12-22

## 2024-12-17 MED ADMIN — divalproex ER (DEPAKOTE ER) extended released 24 hr tablet 1,500 mg: 1500 mg | ORAL | @ 01:00:00

## 2024-12-17 MED ADMIN — guaiFENesin (ROBITUSSIN) oral syrup: 200 mg | ORAL | @ 10:00:00

## 2024-12-17 MED ADMIN — guaiFENesin (ROBITUSSIN) oral syrup: 200 mg | ORAL | @ 18:00:00

## 2024-12-17 NOTE — Consults (Signed)
 Additional order received and the patient is already on the caseload. Continue with plan of care.

## 2024-12-17 NOTE — Consults (Signed)
 SW already following up with pt.   No need for SW consult.    SW will continue follow for avoidable delays and opportunities for progression of care.     [name] Hanly, MSW  Inpatient Social Worker  [phone]

## 2024-12-17 NOTE — Plan of Care (Signed)
 Shift Summary  guaiFENesin  was administered PRN for cough during the shift with relief.  Infection prevention and isolation precautions were maintained throughout the shift.  Sitter remains at bedside.  Fall prevention strategies, including bed alarms and sitter presence, were consistently implemented with no reported falls or injuries.  Patient remained pain-free and participated in PT session as cleared by RN.  Patient remained stable with all safety and comfort interventions in place during the shift.  Parents visited in the afternoon and family was updated to plan of care.    Optimal Comfort and Wellbeing: No pain was reported throughout the shift, and no pain interventions were required; patient was agreeable to PT session and remained calm.    Absence of Infection Signs and Symptoms: Aseptic technique and infection prevention measures, including hand hygiene and personal protective equipment, were consistently maintained, and isolation precautions were followed throughout the shift.    Absence of Fall and Fall-Related Injury: Fall reduction strategies, bed alarms, and sitter presence were maintained at all times, with hourly visual checks confirming the patient remained in bed and no falls or injuries occurred.      Problem: Adult Inpatient Plan of Care  Goal: Optimal Comfort and Wellbeing  Outcome: Shift Focus     Problem: Infection  Goal: Absence of Infection Signs and Symptoms  Outcome: Shift Focus  Intervention: Prevent or Manage Infection  Recent Flowsheet Documentation  Taken 12/17/2024 0800 by Magdaline Kent LABOR, RN  Infection Management: aseptic technique maintained  Isolation Precautions:   contact precautions maintained   droplet precautions maintained     Problem: Fall Injury Risk  Goal: Absence of Fall and Fall-Related Injury  Outcome: Shift Focus  Intervention: Promote Injury-Free Environment  Recent Flowsheet Documentation  Taken 12/17/2024 0800 by Magdaline Kent LABOR, RN  Safety Interventions:   fall reduction program maintained   infection management   isolation precautions   lighting adjusted for tasks/safety   low bed   delirium precautions   seizure precautions   nonskid shoes/slippers when out of bed   sitter at bedside     Problem: Adult Inpatient Plan of Care  Goal: Absence of Hospital-Acquired Illness or Injury  Intervention: Identify and Manage Fall Risk  Recent Flowsheet Documentation  Taken 12/17/2024 0800 by Magdaline Kent LABOR, RN  Safety Interventions:   fall reduction program maintained   infection management   isolation precautions   lighting adjusted for tasks/safety   low bed   delirium precautions   seizure precautions   nonskid shoes/slippers when out of bed   sitter at bedside  Intervention: Prevent Infection  Recent Flowsheet Documentation  Taken 12/17/2024 0800 by Magdaline Kent LABOR, RN  Infection Prevention:   cohorting utilized   equipment surfaces disinfected   environmental surveillance performed   hand hygiene promoted   rest/sleep promoted   personal protective equipment utilized   single patient room provided   visitors restricted/screened     Problem: Skin Injury Risk Increased  Goal: Skin Health and Integrity  Intervention: Optimize Skin Protection  Recent Flowsheet Documentation  Taken 12/17/2024 0800 by Magdaline Kent A, RN  Pressure Reduction Techniques: frequent weight shift encouraged  Pressure Reduction Devices:   positioning supports utilized   pressure-redistributing mattress utilized

## 2024-12-17 NOTE — Consults (Signed)
 [facility]  Follow-Up Psychiatry Consult Note      Date of admission: 12/08/2024  9:03 PM  Service Date: December 17, 2024  Primary Team: [facility])  LOS:  LOS: 8 days      Assessment:   Austin Banks is a 34 y.o. 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  Patient was seen in consultation by request of Nisar A Asmi, MD for evaluation of altered mental status.     Austin Banks presents with symptoms, including visual hallucinations and delusions, consistent with his long-standing prior diagnosis of schizophrenia. Based on chart review these symptoms have been present outside of periods of substance use.  Patient's presentation is further complicated by his h/o cocaine use disorder, cannabis use disorder, and methamphetamine use disorder. UDS on admission was + for cocaine, withdrawal was possible. His sedation is likely multifactorial in the setting of his baseline somnolence, acute illness, cocaine withdrawal, and chronic neurologic conditions. Pt has been on Haldol  15 mg daily for the past few years. Though possible, it's not likely to be the major contributor to his initial presentation. We will continue to follow this patient to monitor response to this medication change and make further adjustments and recommendations as needed.      On assessment today, pt appears more drowsy and delusional. Per collateral with his ACTT, pt has delusions and hallucinations at baseline. It is unclear what caused yesterday's agitation, ddx including poor frustration tolerance, thought disorganization, delirium, and acute on chronic impulsivity. Attention testing is intact today. Delirium is possible but less likely. We recommend adding haldol  and ativan  for PRN use for agitation which primary team continues medical workup.     Diagnoses:   Active Hospital problems:  Principal Problem:    Shock, unspecified    (CMS-HCC)  Active Problems:    Paranoid schizophrenia    (CMS-HCC)    Seizures    (CMS-HCC)    Septic shock    (CMS-HCC)    Upper respiratory infection, viral    Infection due to human metapneumovirus (hMPV)       Problems edited/added by me:  No problems updated.    Risk Assessment:  ASQ screening result: not completed    -A full risk assessment was previously performed on 12/14/2024.  Risk assessment remains essentially unchanged.    Current suicide risk: low risk  Current homicide risk: low risk    Recommendations:     Safety and Observation Level:   -- This patient is currently under involuntary commitment (IVC) given presence of mental illness and evidence of acute dangerousness to self (probability of suffering serious physical debilitation within the near future unless adequate treatment is given). Of note, patients on IVC require 1:1 supervision per hospital policy. Petition and QPE were last completed on 12/16/24. Call hospital police if patient attempts to leave.    Medications:  -- Continue Haldol  po to 15 mg nightly.   -- START Haldol  2 mg PO TID PRN, if refusing PO, give Haldol  2 mg IV for agitation 1st line for agitation. Please use with PRN IV Benadryl .   -- START Ativan  2 mg PO PRN TID, if refusing PO, give  Ativan  2 mg IV for agitation 2nd line for agitation.   -- START Benadryl  50 mg IV TID PRN for dystonia.   -- Continue Thiamine  200 mg IV Q8H x 5 days.  -- Continue home antiepileptic regimen as outlined by neurology.     Further Work-up:   -- As discussed with primary team, have a low threshold to repeat head imaging if facial asymmetry new/worsening from baseline    Behavioral / Environmental:   -- Although not currently delirious, the patient is at an elevated risk for developing delirium. Please utilize delirium prevention protocol.  -- Please order Delirium (prevention) protocol: the following can be copied into a single misc nursing order.        - RN to open blinds every morning.        - To bedside: glasses, hearing aide, patient's own shoes. Make available to patient's when possible and encourage use.        - RN to assess orientation (person, place, & time) qam and prn, with frequent reorientation (verbal & whiteboard) & introduction of caregivers.           - Recommend extended visiting hours with familiar family/friends as feasible.        - Encourage normal sleep-wake cycle by promoting a dark, quiet environment at night and stimulating, light environment during the day.          - Turn the TV off when patient is asleep or not in use.    Follow-up:  -- When patient is discharged, please ensure that their AVS includes information about the 59 Suicide & Crisis Lifeline.  -- The patient will follow-up with his ACT team for mental health care at the time of discharge.  -- We will follow as needed at this time.     Thank you for this consult request. Recommendations have been communicated to the primary team. Please page [phone] (adult psychiatry consults) for any questions or concerns.     Discussed with and seen by Fellow, Donnice Ellen, MD  Discussed with and seen by Attending, Desmond Spinner, MD, who agrees with the assessment and plan.    Herlene Hilt     I evaluated this patient with Herlene Hilt. I attest that I have reviewed the student note and that the components of the history of the present illness, the physical exam, and the assessment and plan documented were performed by me or were performed in my presence by the student where I verified the documentation and performed (or re-performed) the exam and medical decision making.      Wray Chang, MD, PhD  PGY-3, [facility] Psychiatry    Subjective     Relevant events since last seen by psychiatry:   Attempted to elope yesterday. Received IV haldol  2 mg @1651 .     Patient Interview:  On approach for interview patient is resting in bed. He is more sedated than the prior visit on 1/20. He explains that he attempted to leave the hospital yesterday in order to get money from a mailbox to give to his daughter. Willing to cooperate with attention testing; able to name DOWB without error. He endorses visual hallucinations wings on the back of people. Denies SI, HI. Patient denies hearing any voices while in the hospital. Feels well-treated by staff.     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     ROS:   All systems reviewed as negative/unremarkable  aside from the following pertinent positives and negatives: None    Collateral:   - Reviewed medical records in Epic  - [employer] ACT but no one answered.   - ACTBETHA Slough [phone]. NP [name]: [phone]. [name]:Act supervisor [phone]. Mother is working with [employer] to see if there are additional support for patient.   - spoke with [name] on 1/22 (ACT supervisor): She reports he regularly uses cocaine and experiences daily auditory hallucinations. He also believes that he has the power to read minds. He is not compliant with services but will answer questions when prompted. The ACT reports he has not displayed agitation during their visits. They report that he has issues with falls and seizures. They report his parents tried to work with [employer] to find a group home or adult care setting for [place] without success.     Relevant Updates to past psychiatric, medical/surgical, family, or social history: NA    Current Medications:  Scheduled Meds:Scheduled Medications[1]  Continuous Infusions:Infusions Meds[2]  PRN Meds:.PRN Medications[3]    Objective:   Vital signs:   Temp:  [36.4 ??C (97.6 ??F)-36.9 ??C (98.4 ??F)] 36.9 ??C (98.4 ??F)  Pulse:  [52-99] 99  SpO2 Pulse:  [52-89] 89  Resp:  [12-20] 16  BP: (102-136)/(43-89) 108/65  MAP (mmHg):  [62-93] 78  SpO2:  [97 %-100 %] 99 %    Physical Exam:  Gen: No acute distress. somnolent  Pulm: Normal work of breathing.  Neuro/MSK: Tone normal. Extraocular movements intact. (+) horizontal nystagmus. Face is asymmetric at rest (left eyelid drooping slightly more than right). Left eyebrow does not raise as high as the right. Smile/frown even BL. Hearing intact to conversation. Normal bulk/tone. Bulk is normal in all four extremities. Moving all extremities equally and spontaneously. Gait and station deferred given clinical.  Skin: warm and dry.    Mental Status Exam:  Appearance:  appears stated age, ill-appearing, and in bed   Attitude:   calm, cooperative   Behavior/Psychomotor:  appropriate eye contact and no abnormal movements   Speech/Language:   normal rate, not pressured, reduced volume, normal fluency. normal articulation   Mood:  ???I'm doing ok???   Affect:  mood congruent and decreased range (constricted)   Thought process:  disorganized   Thought content:    Denies SI, HI.    Perceptual disturbances:   visual hallucinations of wings on people's backs   Attention/concentration:  able to attend to interview without fluctuations in consciousness and on days of the week backwards performs correctly   Orientation:  grossly oriented.   Memory:  not formally tested, but grossly intact   Fund of knowledge:   not formally assessed   Insight:    Impaired   Judgment:   Impaired   Impulse Control:  Impaired     Relevant laboratory/imaging data was reviewed.    Additional Psychometric Testing:  Not applicable.    Time-based billing disclaimer:  I personally spent 60   minutes face-to-face and non-face-to-face in the care of this patient, which includes all pre, intra, and post visit time on the date of service.  All documented time was specific to the E/M visit and does not include any procedures that may have been performed.    Note initiated by Herlene Hilt MS3         [1]    tocilizumab   162 mg Subcutaneous Q7 Days    cenobamate   400 mg Oral Daily    cetirizine   10 mg Oral  Q AM    clonazePAM   1 mg Oral Daily    clonazePAM   2 mg Oral Nightly    divalproex  ER  1,500 mg Oral Nightly    enoxaparin  (LOVENOX ) injection  40 mg Subcutaneous Q24H    famotidine  (PEPCID ) IV  20 mg Intravenous BID    flu vac ts 2025-26(78mos up)-PF  0.5 mL Intramuscular During hospitalization    folic acid   1 mg Oral Daily    haloperidol   15 mg Oral Nightly    lamoTRIgine   200 mg Oral BID    levocarnitine  (SF)  500 mg Oral BID    midodrine   10 mg Oral BID    midodrine   15 mg Oral Nightly    multivitamins (ADULT)  1 tablet Oral Daily    pneumococcal 21-valent (PF) vaccine  0.5 mL Intramuscular During hospitalization    thiamine  mononitrate (vit B1)  100 mg Oral Daily   [2] [3] acetaminophen , aluminum-magnesium  hydroxide-simethicone, diphenhydrAMINE , guaiFENesin , haloperidol  LACTATE, LORazepam , melatonin, senna

## 2024-12-17 NOTE — Progress Notes (Addendum)
 Hospital Medicine   MICU to Floor Transfer Note    Assessment/Plan:    Principal Problem:    Shock, unspecified    (CMS-HCC)  Active Problems:    Paranoid schizophrenia    (CMS-HCC)    Seizures    (CMS-HCC)    Septic shock    (CMS-HCC)    Upper respiratory infection, viral    Infection due to human metapneumovirus (hMPV)                 Austin Banks is a 34 yo M with past medical history of autoimmune encephalitis and epilepsy (on maintenance tocilizumab  and AED regimen), polysubstance use disorder (cocaine, marijuana, methamphetamine) who was admitted to the MICU 12/08/24 with undifferentiated shock in the setting of metapneumovirus infection.  Presumed patient may have been septic but also was not tachycardic and had no clear source of bacterial infection, so etiology is still somewhat elusive.  Dr Brutus notes that patient may have some degree of dysautonomia potentially related to underlying neurological conditions.  Patient was weaned off pressor support as of 12/12/24 but remained under the care of the MICU team for ongoing somnolence and intermittent hypotension after these medications were weaned.  He was started on midodrine .  For the past 24 hours his blood pressure has been mostly normal range so he is appropriate for transfer to the floor.  Of note, patient's mental status has improved however he has become agitated and delusional, attempting to leave [facility] prompting psychiatry to place patient under IVC 12/16/24.     Undifferentiated Shock - Metapneumovirus + - Possible Dysautonomia:   Patient presented with undifferentiated shock.  Presumed secondary to metapneumovirus, possible aspiration but felt less likely. Blood cultures negative. UA without infection. RUQ US  without evidence of infection. Completed a 7 day course of antibiotics (vanc/zosyn  initially then ceftriaxone ).  He has been afebrile since 12/09/24.  Still with borderline low pressures and there is some concern this may be due to dysautonomia related to chronic neurological disease. Weaned off pressor support as of 12/12/24 but remained under the care of the MICU team for ongoing somnolence and intermittent hypotension after these medications were weaned.  He was started on midodrine  and blood pressures have remained normal range for the past 24 hours.   - Continue midodrine  10 mg AM / afternoon and 15 mg nightly     GAD 65 Ab+ Autoimmune Epilepsy - Altered Mental Status - Nystagmus:   Patient regularly has 1-3 nonconvulsive seizures per week and one generalized tonic clonic seizure every 1-3 months.  Some somnolence at baseline too, possibly related to medications, but certainly worsened on presentation.  Additionally he had had weakness and falls for two days.  This occurs in the setting of regularly missed home medications, metapneumovirus and undiferrentiated shock.  CT head was negative for acute intracranial process. MRI brain with diffuse cortical and cerebellar atrophy advanced for age but no acute abnormalities. EEG without seizure activity. Valproate level therapeutic.  Neurology following initially but signed off.  Psychiatry following now and feel that likely patient's baseline somnolence is multifactorial with chronic neurological conditions, sedating effects of his necessary medications, acute illness, cocaine withdrawal.  Additional considerations could be reactivation of encephalitis, ADEM 2/2 to viral infection although these seem less likely and patient is improving ocerall.  TSH low but free T4 reassuring (euthryoid sick syndrome), RPR and HIV non-reactive. MICU team has been continuing home regimen.  Of note patient has had persistent bilateral nystagmus during his hospital stay,  per MICU team this is his baseline. Would confirm with family in AM.  - s/p Thiamine  200 mg x 5 d then 100 mg daily   - Seizure precautions   - Continue home tocilizumab  every 7 days   - Continue home divalproate 1500 mg nightly  - Continue home levocarnitine  500 mg BID   - Continue home lamotrigine  200 mg BID   - Continue home clonazepam  1 mg AM and 2 mg PM   - Continue home cebobamate 400 mg daily   - Discuss baseline neuro exam with family and if any acute or subacute changes from baseline would consider re-engaging neurology now that patient is more alert regarding dysarthria, nystagmus -- per their last note he had minimal cerebellar or coordination issues   - PT/OT following, also AIR evaluation with PMR following for ultimate discharge recommendations     History of Polysubstance Use - Schizophrenia - Agitation - IVC on 12/16/24:   Patient has a history of schizophrnia followed by ACT team in the community and also a documented history of crack cocaine, tobacco, methamphetamine, and marijuana use. UTox this admission positive for amphetamines and cocaine. Negative ethanol on admission. Reportedly smoked and snorted amphetamines prior to admission. Parents aware of his substance use. Uncertain level of alcohol use, but has not shown signs of alcohol withdrawal so CIWA was discontinued. Initially somnolent as noted above but developed agitation on 12/16/24.  This seems to be outside the time frame for alcohol withdrawal so do not feel appropriate to start medications for this at this time.  Suspect patient's somnolence is simply resolving and he is wishing to leave despite not being medically cleared to do so.  Seen by psychiatry when attempting to leave [facility] 12/16/24 and an IVC was placed.  Will continue on safety precautions.  Could discuss with general psychiatry team following if additional engagement of the addiciton medicine team would be of benefit particularly with connecting patient with outpatient therapeutic resources.   - Psychiatry following, appreciate recommendations  - Consider addiction medicine consult as well   - Patient is currently under IVC with 1:1 sitter   - Continue haloperidol  15 mg nightly   - PRN for agitation:  IV haldol  5 mg and IV benadryl  25 mg PRN for agitation (first line) and IV ativan  2mg  (second line)   - Consult social work    Failure to Thrive:   Patient has had a 25 lb weight loss over the past year. Nutrition consultation with recommendations for regular diet with folic acid , thiamine , MVI and high protein supplements. Vitamin A and E normal range. Manganese normal.  Low serum copper level (uncertain significance clinically)  Normal b12 and folate.   - Continue thiamine  100 mg daily   - Continue multivitamin daily   - Continue folic acid  1 mg daily   - Continue famotidine  20 mg BID    - Follow up vitamin B6  - Consider broader nutritional panel to include vitamin C  and zinc     Thrombocytopenia:   Initially had thrombyctopenia with platelets 66.  B12 and folate normal range. No known liver disease. No evidence of DIC felt that this was likely secondary to bone marrow suppression. Platelets have been improving.   - Continue daily CBC   - Continue lovenox  for VTE prophylaxis       Patient HCDM:    Austin Banks, Austin Banks - Father - [phone]  Austin Banks, Austin Banks - Mother - [phone]    I personally spent 90 minutes face-to-face and  non-face-to-face in the care of this patient, which includes all pre, intra, and post visit time on the date of service.  All documented time was specific to the E/M visit and does not include any procedures that may have been performed.      ___________________________________________________________________    Subjective:  Patient lying in bed listening to music. Denies any pain or discomfort. Appears calm, states however that he really wants to go home and hopes we will talk to his parents in the morning.     Labs/Studies:  Labs and Studies from the last 24hrs per EMR and Reviewed    Objective:    Physical Exam:  General:  No apparent distress  HEENT:  Neck supple, moist oral mucosa  Lungs:  Normal work of breathing, good air entry bilaterally, no crackles or wheezing  CV:  RRR, no murmurs, gallops or rubs, normal s1 and s2  Extremities:  No deformity, warm and well perfused, no lower extremity edema   Skin:  No rashes, no concerning lesions, no bruising  Neuro:  Notable horizontal nystagmus persistent during my examination, alert and oriented to person, place, time although limited understanding of situation regarding his care and reason for hospitalization, plans for discharge, dysarthric speech

## 2024-12-18 LAB — BASIC METABOLIC PANEL
ANION GAP: 13 mmol/L (ref 5–14)
ANION GAP: 11 mmol/L (ref 5–14)
BLOOD UREA NITROGEN: 10 mg/dL (ref 9–23)
BLOOD UREA NITROGEN: 10 mg/dL (ref 9–23)
BUN / CREAT RATIO: 11
BUN / CREAT RATIO: 11
CALCIUM: 9.1 mg/dL (ref 8.7–10.4)
CALCIUM: 9.2 mg/dL (ref 8.7–10.4)
CHLORIDE: 102 mmol/L (ref 98–107)
CHLORIDE: 103 mmol/L (ref 98–107)
CO2: 26 mmol/L (ref 20.0–31.0)
CO2: 27 mmol/L (ref 20.0–31.0)
CREATININE: 0.92 mg/dL (ref 0.73–1.18)
CREATININE: 0.95 mg/dL (ref 0.73–1.18)
EGFR CKD-EPI (2021) MALE: >90 mL/min/{1.73_m2} (ref >=60)
EGFR CKD-EPI (2021) MALE: >90 mL/min/{1.73_m2} (ref >=60)
GLUCOSE RANDOM: 95 mg/dL (ref 70–179)
GLUCOSE RANDOM: 86 mg/dL (ref 70–179)
POTASSIUM: 4.6 mmol/L (ref 3.4–4.8)
POTASSIUM: 4.8 mmol/L (ref 3.4–4.8)
SODIUM: 141 mmol/L (ref 135–145)
SODIUM: 141 mmol/L (ref 135–145)

## 2024-12-18 LAB — CBC
HEMATOCRIT: 38.3 % — ABNORMAL LOW (ref 39.0–48.0)
HEMATOCRIT: 38.7 % — ABNORMAL LOW (ref 39.0–48.0)
HEMOGLOBIN: 12.9 g/dL (ref 12.9–16.5)
HEMOGLOBIN: 13.2 g/dL (ref 12.9–16.5)
MEAN CORPUSCULAR HEMOGLOBIN CONC: 33.5 g/dL (ref 32.0–36.0)
MEAN CORPUSCULAR HEMOGLOBIN CONC: 34.1 g/dL (ref 32.0–36.0)
MEAN CORPUSCULAR HEMOGLOBIN: 31.8 pg (ref 25.9–32.4)
MEAN CORPUSCULAR HEMOGLOBIN: 31.9 pg (ref 25.9–32.4)
MEAN CORPUSCULAR VOLUME: 94.9 fL (ref 77.6–95.7)
MEAN CORPUSCULAR VOLUME: 93.5 fL (ref 77.6–95.7)
MEAN PLATELET VOLUME: 8.1 fL (ref 6.8–10.7)
MEAN PLATELET VOLUME: 8.3 fL (ref 6.8–10.7)
PLATELET COUNT: 292 10*9/L (ref 150–450)
PLATELET COUNT: 300 10*9/L (ref 150–450)
RED BLOOD CELL COUNT: 4.04 10*12/L — ABNORMAL LOW (ref 4.26–5.60)
RED BLOOD CELL COUNT: 4.14 10*12/L — ABNORMAL LOW (ref 4.26–5.60)
RED CELL DISTRIBUTION WIDTH: 13.2 % (ref 12.2–15.2)
RED CELL DISTRIBUTION WIDTH: 12.9 % (ref 12.2–15.2)
WBC ADJUSTED: 5.2 10*9/L (ref 3.6–11.2)
WBC ADJUSTED: 4.9 10*9/L (ref 3.6–11.2)

## 2024-12-18 LAB — MAGNESIUM: MAGNESIUM: 1.8 mg/dL (ref 1.6–2.6)

## 2024-12-18 MED ADMIN — levocarnitine (CARNITOR) (SF) oral solution: 500 mg | ORAL | @ 02:00:00

## 2024-12-18 MED ADMIN — levocarnitine (CARNITOR) (SF) oral solution: 500 mg | ORAL | @ 13:00:00

## 2024-12-18 MED ADMIN — midodrine (PROAMATINE) tablet 10 mg: 10 mg | ORAL | @ 10:00:00

## 2024-12-18 MED ADMIN — midodrine (PROAMATINE) tablet 10 mg: 10 mg | ORAL | @ 20:00:00

## 2024-12-18 MED ADMIN — clonazePAM (KlonoPIN) tablet 1 mg: 1 mg | ORAL | @ 13:00:00

## 2024-12-18 MED ADMIN — midodrine (PROAMATINE) tablet 15 mg: 15 mg | ORAL | @ 02:00:00

## 2024-12-18 MED ADMIN — cetirizine (ZYRTEC) tablet 10 mg: 10 mg | ORAL | @ 13:00:00

## 2024-12-18 MED ADMIN — haloperidol (HALDOL) tablet 15 mg: 15 mg | ORAL | @ 02:00:00 | NDC 72603023001

## 2024-12-18 MED ADMIN — enoxaparin (LOVENOX) syringe 40 mg: 40 mg | SUBCUTANEOUS | @ 14:00:00

## 2024-12-18 MED ADMIN — folic acid (FOLVITE) tablet 1 mg: 1 mg | ORAL | @ 13:00:00

## 2024-12-18 MED ADMIN — cenobamate (XCOPRI) tablet 400 mg: 400 mg | ORAL | @ 13:00:00 | NDC 71699005099

## 2024-12-18 MED ADMIN — lamoTRIgine (LaMICtal) tablet 200 mg: 200 mg | ORAL | @ 13:00:00

## 2024-12-18 MED ADMIN — lamoTRIgine (LaMICtal) tablet 200 mg: 200 mg | ORAL | @ 02:00:00

## 2024-12-18 MED ADMIN — famotidine (PF) (PEPCID) injection 20 mg: 20 mg | INTRAVENOUS | @ 14:00:00

## 2024-12-18 MED ADMIN — famotidine (PF) (PEPCID) injection 20 mg: 20 mg | INTRAVENOUS | @ 02:00:00

## 2024-12-18 MED ADMIN — clonazePAM (KlonoPIN) tablet 2 mg: 2 mg | ORAL | @ 02:00:00

## 2024-12-18 MED ADMIN — multivitamins, therapeutic with minerals tablet 1 tablet: 1 | ORAL | @ 13:00:00

## 2024-12-18 MED ADMIN — thiamine mononitrate (vit B1) tablet 100 mg: 100 mg | ORAL | @ 13:00:00 | Stop: 2024-12-22

## 2024-12-18 MED ADMIN — divalproex ER (DEPAKOTE ER) extended released 24 hr tablet 1,500 mg: 1500 mg | ORAL | @ 02:00:00

## 2024-12-18 MED ADMIN — guaiFENesin (ROBITUSSIN) oral syrup: 200 mg | ORAL | @ 10:00:00

## 2024-12-18 NOTE — Progress Notes (Signed)
 Hospital Medicine Daily Progress Note    Assessment/Plan:    Principal Problem:    Shock, unspecified    (CMS-HCC)  Active Problems:    Paranoid schizophrenia    (CMS-HCC)    Seizures    (CMS-HCC)    Septic shock    (CMS-HCC)    Upper respiratory infection, viral    Infection due to human metapneumovirus (hMPV)                 Austin Banks is a 34 yo M with past medical history of autoimmune encephalitis and epilepsy (on maintenance tocilizumab  and AED regimen), polysubstance use disorder (cocaine, marijuana, methamphetamine) who was admitted to the MICU 12/08/24 with undifferentiated shock in the setting of metapneumovirus infection. Transferred out of MICU 1/22. Currently under IVC pending ongoing psychiatry evaluations.     Undifferentiated Shock - Metapneumovirus + - Possible Dysautonomia:   Patient presented with undifferentiated shock.  Presumed secondary to metapneumovirus, possible aspiration but felt less likely. Blood cultures negative. UA without infection. RUQ US  without evidence of infection. Completed a 7 day course of antibiotics (vanc/zosyn  initially then ceftriaxone ).  He has been afebrile since 12/09/24.  Still with borderline low pressures and there is some concern this may be due to dysautonomia related to chronic neurological disease. Weaned off pressor support as of 12/12/24 but remained under the care of the MICU team for ongoing somnolence and intermittent hypotension after these medications were weaned.  He was started on midodrine  and blood pressures have remained normal range for the past 24 hours.   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  Some somnolence at baseline too, possibly related to medications, but certainly worsened on presentation. CT head was negative for acute intracranial process. MRI brain with diffuse cortical and cerebellar atrophy advanced for age but no acute abnormalities. EEG without seizure activity. Valproate level therapeutic.  Neurology now signed off.  Psychiatry following now and feel that likely patient's baseline somnolence is multifactorial with chronic neurological conditions, sedating effects of his necessary medications, acute illness, cocaine withdrawal.  Additional considerations could be reactivation of encephalitis, ADEM 2/2 to viral infection although these seem less likely and patient is improving ocerall.  TSH low but free T4 reassuring (euthryoid sick syndrome), RPR and HIV non-reactive. MICU team has been continuing home regimen.  Of note patient has had persistent bilateral nystagmus during his hospital stay per his baseline.  - s/p Thiamine  200 mg x 5 d then 100 mg daily   - Seizure precautions   - Continue home tocilizumab  every 7 days on Fridays  - Continue home divalproate 1500 mg nightly  - Continue home levocarnitine  500 mg BID   - Continue home lamotrigine  200 mg BID   - Continue home clonazepam  1 mg AM and 2 mg PM   - Continue home cebobamate 400 mg daily   - PT/OT following  - PMR following for ultimate discharge recommendations      History of Polysubstance Use - Schizophrenia - Agitation - IVC on 12/16/24:   Patient has a history of schizophrenia followed by ACT team in the community and also a documented history of crack cocaine, tobacco, methamphetamine, and marijuana use. UTox this admission positive for amphetamines and cocaine. Negative ethanol on admission with  low CIWA scores. Parents aware of his substance use. Initially somnolent as noted above but developed agitation on 12/16/24.  This seems to be outside the time frame for alcohol withdrawal so do not feel appropriate to start medications for this at this time.  Suspect patient's somnolence is simply resolving and he is wishing to leave despite not being medically cleared to do so.  Seen by psychiatry when attempting to leave [facility] 12/16/24 and an IVC was placed.  Will continue on safety precautions.  Could discuss with general psychiatry team following if additional engagement of the addiciton medicine team would be of benefit particularly with connecting patient with outpatient therapeutic resources.   - Psychiatry following, appreciate recommendations  - Consider addiction medicine consult as well   - Patient is currently under IVC with 1:1 sitter   - Continue haloperidol  15 mg nightly   - PRN for agitation:  IV haldol  5 mg and IV benadryl  25 mg PRN for agitation (first line) and IV ativan  2mg  (second line)   - Consult social work     Failure to Thrive:   Patient has had a 25 lb weight loss over the past year. Nutrition consultation with recommendations for regular diet with folic acid , thiamine , MVI and high protein supplements. Vitamin A and E normal range. Manganese normal.  Low serum copper level (uncertain significance clinically)  Normal b12 and folate.   - Continue thiamine  100 mg daily   - Continue multivitamin daily   - Continue folic acid  1 mg daily   - Continue famotidine  20 mg BID    - Follow up vitamin B6     Thrombocytopenia (resolved)  Thrombocytopenia nadired in 40s, now recovered to normal.  B12 and folate normal range. No known liver disease. No evidence of DIC. Most likely secondary to bone marrow suppression.   - Continue daily CBC   - Continue lovenox  for VTE prophylaxis      Advanced care planning  - Code status: full   - Healthcare proxy: parents  - Disposition: pending IVC removal, PT/OT recs and PM&R re-evaluation  PT/OT recommendations: 5x weekly, High intensity (12/09/24) / 5x weekly, High intensity (12/18/24)  DME needs: Defer to post acute (12/09/24) / None (Pt reports using shower chair at baseline) (12/15/24)     FEN/GI/PPX  - Diet: regular  - IVF: none  - Bowel regimen: n/a  - GI ppx: n/a  - DVT ppx: enoxaparin     I personally spent 30 minutes face-to-face and non-face-to-face in the care of this patient, which includes all pre, intra, and post visit time on the date of service.  All documented time was specific to the E/M visit and does not include any procedures that may have been performed.    ___________________________________________________________________    Subjective:  Doing okay today, no complaints.  Feels tired bc woke up at 2am and had trouble going back to sleep.  Tolerating PO, voiding well. No other complaints.  Hopes to go home by Monday.     Labs/Studies:  Labs and Studies from the last 24hrs per EMR and Reviewed    Objective:  Temp:  [36.6 ??C (97.9 ??F)-36.7 ??C (98.1 ??F)] 36.6 ??C (97.9 ??F)  Pulse:  [51-95] 66  Resp:  [18-20] 18  BP: (98-113)/(62-70) 113/70  SpO2:  [93 %-100 %] 100 %    Gen: alert, oriented, no distress, sitting up in chair  HEENT: atraumatic, no scleral icterus, no nasal drainage.  Moist mucus membranes. +horizontal nystagmus  CV:  RRR, no m/r/g, no peripheral edema, distal pulses intact  Resp: breathing comfortably without accessory muscle use, lungs clear bilaterally throughout, speaking in full sentences  Abd: nondistended, soft, nontender throughout.  +bowel sounds  Ext: warm, well perfused, no edema or trauma  Skin: no rash, no bruising  Neuro: awake and answering questions but drowsy, answers appropriately, +horizontal nystagmus, no dysarthria, grossly nonfocal with normal strength, no abnormal movements  Psych: calm, appropriate for situation

## 2024-12-18 NOTE — Plan of Care (Signed)
 Shift Summary  Sitter at bedside for safety  Safety and infection prevention protocols were maintained throughout the shift, including contact and droplet precautions.  Patient denied pain and participated in occupational therapy with encouragement.  Minimal assistance was required for ADLs, with independence in hygiene and bathing documented.  Patient remained free from falls and hospital-acquired injuries during the shift. Ambulated with PT tech    Absence of Hospital-Acquired Illness or Injury: No hospital-acquired injuries were documented during the shift, and safety interventions such as low bed positioning, fall reduction program, and sitter presence were consistently maintained.    Optimal Comfort and Wellbeing: Pain was denied and comfort measures were supported by frequent repositioning and encouragement for occupational therapy participation.    Absence of Infection Signs and Symptoms: Infection prevention protocols including hand hygiene and personal protective equipment were maintained, and isolation precautions were consistently followed throughout the shift.    Absence of Fall and Fall-Related Injury: Fall prevention strategies were implemented, including hourly visual checks, toileting every two hours, and use of nonskid footwear, with no falls or injuries reported.    Improved Ability to Complete Activities of Daily Living: Assistance needs for ADLs were minimal, with independence noted in hygiene and bathing, and only slight supervision required for dressing, grooming, and eating.

## 2024-12-18 NOTE — Plan of Care (Signed)
 Shift Summary  Enhanced droplet/contact precautions and infection management were maintained during the shift, with sitter present and bed alarm active for safety.   guaiFENesin  was administered PRN for cough in the early morning hours.   No falls or injuries occurred, and independence in ADLs was preserved throughout the shift.   Skin integrity was supported by frequent repositioning and use of pressure reduction devices.   Overall, remained stable with infection prevention and safety measures in place.    Absence of Infection Signs and Symptoms: Aseptic technique and infection prevention measures were maintained throughout the shift, and isolation precautions were consistently followed; CBC results were within normal limits for WBC and platelets.    Absence of Fall and Fall-Related Injury: Bed alarm remained active, sitter was present, and hourly visual checks were performed; no falls or injuries occurred during the shift.    Improved Ability to Complete Activities of Daily Living: Remained independent in bathing, oral care, hygiene, and feeding throughout the shift, with periods of sitting at the edge of the bed and active or passive range of motion performed.    Skin Health and Integrity: Skin remained within defined limits, frequent weight shifts and pressure reduction techniques were encouraged, and a pressure-redistributing mattress was utilized; adhesive use was limited to protect skin integrity.    Improved Nutritional Intake: Nutrition was rated excellent and self-feeding ability was maintained during all checks.

## 2024-12-19 MED ADMIN — levocarnitine (CARNITOR) (SF) oral solution: 500 mg | ORAL | @ 14:00:00

## 2024-12-19 MED ADMIN — levocarnitine (CARNITOR) (SF) oral solution: 500 mg | ORAL | @ 01:00:00

## 2024-12-19 MED ADMIN — midodrine (PROAMATINE) tablet 10 mg: 10 mg | ORAL | @ 22:00:00

## 2024-12-19 MED ADMIN — midodrine (PROAMATINE) tablet 10 mg: 10 mg | ORAL | @ 12:00:00

## 2024-12-19 MED ADMIN — clonazePAM (KlonoPIN) tablet 1 mg: 1 mg | ORAL | @ 14:00:00

## 2024-12-19 MED ADMIN — midodrine (PROAMATINE) tablet 15 mg: 15 mg | ORAL | @ 01:00:00

## 2024-12-19 MED ADMIN — cetirizine (ZYRTEC) tablet 10 mg: 10 mg | ORAL | @ 14:00:00

## 2024-12-19 MED ADMIN — haloperidol (HALDOL) tablet 15 mg: 15 mg | ORAL | @ 02:00:00 | NDC 72603023001

## 2024-12-19 MED ADMIN — enoxaparin (LOVENOX) syringe 40 mg: 40 mg | SUBCUTANEOUS | @ 14:00:00

## 2024-12-19 MED ADMIN — folic acid (FOLVITE) tablet 1 mg: 1 mg | ORAL | @ 14:00:00

## 2024-12-19 MED ADMIN — cenobamate (XCOPRI) tablet 400 mg: 400 mg | ORAL | @ 14:00:00 | NDC 71699005099

## 2024-12-19 MED ADMIN — lamoTRIgine (LaMICtal) tablet 200 mg: 200 mg | ORAL | @ 01:00:00

## 2024-12-19 MED ADMIN — lamoTRIgine (LaMICtal) tablet 200 mg: 200 mg | ORAL | @ 14:00:00

## 2024-12-19 MED ADMIN — famotidine (PF) (PEPCID) injection 20 mg: 20 mg | INTRAVENOUS | @ 01:00:00

## 2024-12-19 MED ADMIN — famotidine (PF) (PEPCID) injection 20 mg: 20 mg | INTRAVENOUS | @ 14:00:00

## 2024-12-19 MED ADMIN — clonazePAM (KlonoPIN) tablet 2 mg: 2 mg | ORAL | @ 01:00:00

## 2024-12-19 MED ADMIN — multivitamins, therapeutic with minerals tablet 1 tablet: 1 | ORAL | @ 14:00:00

## 2024-12-19 MED ADMIN — thiamine mononitrate (vit B1) tablet 100 mg: 100 mg | ORAL | @ 14:00:00 | Stop: 2024-12-27

## 2024-12-19 MED ADMIN — divalproex ER (DEPAKOTE ER) extended released 24 hr tablet 1,500 mg: 1500 mg | ORAL | @ 01:00:00

## 2024-12-19 MED ADMIN — guaiFENesin (ROBITUSSIN) oral syrup: 200 mg | ORAL | @ 06:00:00

## 2024-12-19 MED ADMIN — guaiFENesin (ROBITUSSIN) oral syrup: 200 mg | ORAL | @ 02:00:00

## 2024-12-19 MED ADMIN — guaiFENesin (ROBITUSSIN) oral syrup: 200 mg | ORAL | @ 10:00:00

## 2024-12-19 MED ADMIN — **Patient Supplied** tocilizumab (ACTEMRA ACTPEN) subcutaneous injection 162 mg: 162 mg | SUBCUTANEOUS | @ 01:00:00 | NDC 50242014301

## 2024-12-19 NOTE — Plan of Care (Signed)
 Shift Summary  guaiFENesin  was administered three times during the shift for cough, and melatonin was refused when offered for sleep.  Pain was assessed twice and remained at 0, with no reported discomfort or need for additional pain interventions.  Fall prevention strategies, including sitter presence and regular toileting, were consistently implemented with no reported falls or injuries.  Contact and droplet precautions were maintained, and infection prevention measures were followed throughout the shift.  Patient remained independent with activities of daily living and nutritional intake was adequate; overall, remained stable with no new complications documented during the shift.    Absence of Hospital-Acquired Illness or Injury: Contact and droplet precautions were maintained throughout the shift, and hand hygiene and PPE use were consistently documented; no new hospital-acquired issues were documented.    Optimal Comfort and Wellbeing: Pain remained at 0 throughout the shift and no discomfort was reported; melatonin was refused, and guaiFENesin  was given as needed for cough.    Absence of Fall and Fall-Related Injury: Fall reduction interventions were maintained, including sitter presence, bed in low position, and regular toileting; no falls or injuries were documented during the shift.    Improved Ability to Complete Activities of Daily Living: Independence with feeding, hygiene, and bathing was maintained, and mobility remained without limitation; patient was able to turn self in bed and walk occasionally.    Improved Nutritional Intake: Nutritional intake was documented as adequate and self-feeding ability was maintained throughout the shift.

## 2024-12-19 NOTE — Progress Notes (Signed)
 Hospital Medicine Daily Progress Note    Assessment/Plan:    Principal Problem:    Shock, unspecified    (CMS-HCC)  Active Problems:    Paranoid schizophrenia    (CMS-HCC)    Seizures    (CMS-HCC)    Septic shock    (CMS-HCC)    Upper respiratory infection, viral    Infection due to human metapneumovirus (hMPV)                 Austin Banks is a 34 yo M with past medical history of autoimmune encephalitis and epilepsy (on maintenance tocilizumab  and AED regimen), polysubstance use disorder (cocaine, marijuana, methamphetamine) who was admitted to the MICU 12/08/24 with undifferentiated shock in the setting of metapneumovirus infection. Transferred out of MICU 1/22. Currently under IVC pending ongoing psychiatry evaluations.     Undifferentiated Shock - Metapneumovirus + - Possible Dysautonomia:   Patient presented with undifferentiated shock.  Presumed secondary to metapneumovirus, possible aspiration but felt less likely. Blood cultures negative. UA without infection. RUQ US  without evidence of infection. Completed a 7 day course of antibiotics (vanc/zosyn  initially then ceftriaxone ).  He has been afebrile since 12/09/24.  Still with borderline low pressures and there is some concern this may be due to dysautonomia related to chronic neurological disease. Weaned off pressor support as of 12/12/24 but remained under the care of the MICU team for ongoing somnolence and intermittent hypotension after these medications were weaned.  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  Some somnolence at baseline too, possibly related to medications, but certainly worsened on presentation. CT head was negative for acute intracranial process. MRI brain with diffuse cortical and cerebellar atrophy advanced for age but no acute abnormalities. EEG without seizure activity. Valproate level therapeutic.  Neurology now signed off.  Psychiatry following now and feel that likely patient's baseline somnolence is multifactorial with chronic neurological conditions, sedating effects of his necessary medications, acute illness, cocaine withdrawal.  Additional considerations could be reactivation of encephalitis, ADEM 2/2 to viral infection although these seem less likely and patient is improving ocerall.  TSH low but free T4 reassuring (euthryoid sick syndrome), RPR and HIV non-reactive.  Of note patient has had persistent bilateral nystagmus during his hospital stay per his baseline.  - s/p Thiamine  200 mg x 5 d then 100 mg daily   - Seizure precautions   - Continue home tocilizumab  every 7 days on Fridays  - Continue home divalproate 1500 mg nightly  - Continue home levocarnitine  500 mg BID   - Continue home lamotrigine  200 mg BID   - Continue home clonazepam  1 mg AM and 2 mg PM   - Continue home cebobamate 400 mg daily   - PT/OT following  - PMR following for ultimate discharge recommendations      History of Polysubstance Use - Schizophrenia - Agitation - IVC on 12/16/24:   Patient has a history of schizophrenia followed by ACT team in the community and also a documented history of crack cocaine, tobacco, methamphetamine, and marijuana use. UTox this admission positive for amphetamines and cocaine. Negative ethanol on admission with low CIWA scores. Parents aware of his substance use. Initially somnolent as  noted above but developed agitation on 12/16/24.  This seems to be outside the time frame for alcohol withdrawal so do not feel appropriate to start medications for this at this time.  Suspect patient's somnolence is simply resolving and he is wishing to leave despite not being medically cleared to do so.  Seen by psychiatry when attempting to leave [facility] 12/16/24 and an IVC was placed.  Will continue on safety precautions.  Could discuss with general psychiatry team following if additional engagement of the addiciton medicine team would be of benefit particularly with connecting patient with outpatient therapeutic resources.   - Psychiatry following, appreciate recommendations  - Consider addiction medicine consult as well   - Patient is currently under IVC with 1:1 sitter   - Continue haloperidol  15 mg nightly   - PRN for agitation:  IV haldol  5 mg and IV benadryl  25 mg PRN for agitation (first line) and IV ativan  2mg  (second line)   - Consult social work     Failure to Thrive:   Patient has had a 25 lb weight loss over the past year. Nutrition consultation with recommendations for regular diet with folic acid , thiamine , MVI and high protein supplements. Vitamin A and E normal range. Manganese normal.  Low serum copper level (uncertain significance clinically)  Normal b12 and folate.   - Continue thiamine  100 mg daily   - Continue multivitamin daily   - Continue folic acid  1 mg daily   - Continue famotidine  20 mg BID    - Follow up vitamin B6     Thrombocytopenia (resolved)  Thrombocytopenia nadired in 40s, now recovered to normal.  B12 and folate normal range. No known liver disease. No evidence of DIC. Most likely secondary to bone marrow suppression.   - Continue daily CBC   - Continue lovenox  for VTE prophylaxis      Advanced care planning  - Code status: full   - Healthcare proxy: parents  - Disposition: pending IVC removal, PT/OT recs and PM&R re-evaluation - suspect patient will not currently be agreeable to rehab but will discuss with him and parents  PT/OT recommendations: 5x weekly, High intensity (12/09/24) / 5x weekly, High intensity (12/18/24)  DME needs: Defer to post acute (12/09/24) / None (Pt reports using shower chair at baseline) (12/15/24)     FEN/GI/PPX  - Diet: regular  - IVF: none  - Bowel regimen: n/a  - GI ppx: n/a  - DVT ppx: enoxaparin     I personally spent 30 minutes face-to-face and non-face-to-face in the care of this patient, which includes all pre, intra, and post visit time on the date of service.  All documented time was specific to the E/M visit and does not include any procedures that may have been performed.    ___________________________________________________________________    Subjective:  Doing okay today, no complaints.  Enjoying lunch and watching basketball.      Labs/Studies:  Labs and Studies from the last 24hrs per EMR and Reviewed    Objective:  Temp:  [36.4 ??C (97.5 ??F)-36.6 ??C (97.9 ??F)] 36.4 ??C (97.5 ??F)  Pulse:  [58-66] 58  Resp:  [18-20] 20  BP: (101-113)/(59-70) 101/65  SpO2:  [99 %-100 %] 100 %    Gen: alert, oriented, no distress, sitting up in bed feeding himself  HEENT: atraumatic, no scleral icterus, no nasal drainage.  Moist mucus membranes. +horizontal nystagmus  CV: RRR, no m/r/g, no peripheral edema, distal pulses intact  Resp: breathing comfortably without accessory muscle  use, lungs clear bilaterally throughout, speaking in full sentences  Abd: nondistended, soft, nontender throughout.  +bowel sounds  Ext: warm, well perfused, no edema or trauma  Skin: no rash, no bruising  Neuro: awake and answering questions, answers appropriately, +horizontal nystagmus, no dysarthria, grossly nonfocal with normal strength, no abnormal movements  Psych: calm, appropriate for situation

## 2024-12-19 NOTE — Plan of Care (Signed)
 Shift Summary  Patient refused midodrine  during the afternoon, which was documented.  Both peripheral IV sites were assessed and found to be clean, dry, and intact, with no interventions needed.  Hourly visual checks confirmed the patient remained in bed and awake throughout the shift, with no falls or injuries reported.  Vitals remained stable and temperature was within normal limits during the shift.  Overall, the patient maintained skin integrity and no new hospital-acquired complications were documented.    Absence of Hospital-Acquired Illness or Injury: No new hospital-acquired injuries or complications were documented during the shift, and both peripheral IV sites remained clean, dry, and intact with no interventions needed.    Absence of Infection Signs and Symptoms: Temperature remained within normal limits and both IV sites were assessed as clean and dry, with no documentation of redness or drainage.    Absence of Fall and Fall-Related Injury: Hourly visual checks confirmed the patient was awake and in bed throughout the shift, with no falls or injuries reported.    Skin Health and Integrity: Peripheral IV dressings on the left forearm and posterior forearm were clean, dry, and intact, and no skin breakdown or dressing interventions were required.

## 2024-12-20 MED ADMIN — levocarnitine (CARNITOR) (SF) oral solution: 500 mg | ORAL | @ 03:00:00

## 2024-12-20 MED ADMIN — levocarnitine (CARNITOR) (SF) oral solution: 500 mg | ORAL | @ 14:00:00

## 2024-12-20 MED ADMIN — midodrine (PROAMATINE) tablet 10 mg: 10 mg | ORAL | @ 11:00:00

## 2024-12-20 MED ADMIN — midodrine (PROAMATINE) tablet 10 mg: 10 mg | ORAL | @ 21:00:00

## 2024-12-20 MED ADMIN — clonazePAM (KlonoPIN) tablet 1 mg: 1 mg | ORAL | @ 14:00:00

## 2024-12-20 MED ADMIN — midodrine (PROAMATINE) tablet 15 mg: 15 mg | ORAL | @ 03:00:00

## 2024-12-20 MED ADMIN — cetirizine (ZYRTEC) tablet 10 mg: 10 mg | ORAL | @ 14:00:00

## 2024-12-20 MED ADMIN — haloperidol (HALDOL) tablet 15 mg: 15 mg | ORAL | @ 03:00:00 | NDC 72603023001

## 2024-12-20 MED ADMIN — enoxaparin (LOVENOX) syringe 40 mg: 40 mg | SUBCUTANEOUS | @ 14:00:00

## 2024-12-20 MED ADMIN — folic acid (FOLVITE) tablet 1 mg: 1 mg | ORAL | @ 14:00:00

## 2024-12-20 MED ADMIN — cenobamate (XCOPRI) tablet 400 mg: 400 mg | ORAL | @ 14:00:00 | NDC 71699005099

## 2024-12-20 MED ADMIN — lamoTRIgine (LaMICtal) tablet 200 mg: 200 mg | ORAL | @ 14:00:00

## 2024-12-20 MED ADMIN — lamoTRIgine (LaMICtal) tablet 200 mg: 200 mg | ORAL | @ 03:00:00

## 2024-12-20 MED ADMIN — famotidine (PF) (PEPCID) injection 20 mg: 20 mg | INTRAVENOUS | @ 14:00:00 | Stop: 2024-12-20

## 2024-12-20 MED ADMIN — famotidine (PF) (PEPCID) injection 20 mg: 20 mg | INTRAVENOUS | @ 03:00:00

## 2024-12-20 MED ADMIN — clonazePAM (KlonoPIN) tablet 2 mg: 2 mg | ORAL | @ 03:00:00

## 2024-12-20 MED ADMIN — multivitamins, therapeutic with minerals tablet 1 tablet: 1 | ORAL | @ 14:00:00

## 2024-12-20 MED ADMIN — thiamine mononitrate (vit B1) tablet 100 mg: 100 mg | ORAL | @ 14:00:00 | Stop: 2024-12-27

## 2024-12-20 MED ADMIN — divalproex ER (DEPAKOTE ER) extended released 24 hr tablet 1,500 mg: 1500 mg | ORAL | @ 03:00:00

## 2024-12-20 MED ADMIN — guaiFENesin (ROBITUSSIN) oral syrup: 200 mg | ORAL | @ 08:00:00

## 2024-12-20 NOTE — Plan of Care (Signed)
 Shift Summary  Safety interventions and fall risk measures were maintained throughout the shift, with no documented injuries or falls.  Comfort and nutritional needs were met with full meal and supplement intake.   Droplet and contact isolation precautions were followed.  Hourly rounds were performed consistently to monitor status and provide support.  Overall, the shift was stable with no new complications documented.    Absence of Hospital-Acquired Illness or Injury: No hospital-acquired injuries or illnesses were documented during the shift, and safety interventions such as low bed positioning and adjusted lighting were maintained throughout.    Optimal Comfort and Wellbeing: Comfort measures were supported by regular rounds and environmental adjustments, and 100% of meals were consumed with adequate supplement intake.    Absence of Infection Signs and Symptoms: SOFA scores remained at 0 throughout the shift, and droplet isolation precautions were observed.    Absence of Fall and Fall-Related Injury: Fall risk interventions including hourly visual checks, scheduled toileting, and bed safety measures were consistently implemented, with no falls or related injuries documented.

## 2024-12-20 NOTE — Progress Notes (Signed)
 Hospital Medicine Daily Progress Note    Assessment/Plan:    Principal Problem:    Shock, unspecified    (CMS-HCC)  Active Problems:    Paranoid schizophrenia    (CMS-HCC)    Seizures    (CMS-HCC)    Septic shock    (CMS-HCC)    Upper respiratory infection, viral    Infection due to human metapneumovirus (hMPV)                 Austin Banks is a 34 yo M with past medical history of autoimmune encephalitis and epilepsy (on maintenance tocilizumab  and AED regimen), polysubstance use disorder (cocaine, marijuana, methamphetamine) who was admitted to the MICU 12/08/24 with undifferentiated shock in the setting of metapneumovirus infection. Transferred out of MICU 1/22. Currently under IVC pending ongoing psychiatry evaluations.     Undifferentiated Shock - Metapneumovirus + - Possible Dysautonomia:   Patient presented with undifferentiated shock.  Presumed secondary to metapneumovirus, possible aspiration but felt less likely. Blood cultures negative. UA without infection. RUQ US  without evidence of infection. Completed a 7 day course of antibiotics (vanc/zosyn  initially then ceftriaxone ).  He has been afebrile since 12/09/24.  Still with borderline low pressures and there is some concern this may be due to dysautonomia related to chronic neurological disease. Weaned off pressor support as of 12/12/24 but remained under the care of the MICU team for ongoing somnolence and intermittent hypotension after these medications were weaned.  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  Some somnolence at baseline too, possibly related to medications, but certainly worsened on presentation. CT head was negative for acute intracranial process. MRI brain with diffuse cortical and cerebellar atrophy advanced for age but no acute abnormalities. EEG without seizure activity. Valproate level therapeutic.  Neurology now signed off.  Psychiatry following now and feel that likely patient's baseline somnolence is multifactorial with chronic neurological conditions, sedating effects of his necessary medications, acute illness, cocaine withdrawal.  Additional considerations could be reactivation of encephalitis, ADEM 2/2 to viral infection although these seem less likely and patient is improving ocerall.  TSH low but free T4 reassuring (euthryoid sick syndrome), RPR and HIV non-reactive.  Of note patient has had persistent bilateral nystagmus during his hospital stay per his baseline.  - s/p Thiamine  200 mg x 5 d then 100 mg daily   - Seizure precautions   - Continue home tocilizumab  every 7 days on Fridays  - Continue home divalproate 1500 mg nightly  - Continue home levocarnitine  500 mg BID   - Continue home lamotrigine  200 mg BID   - Continue home clonazepam  1 mg AM and 2 mg PM   - Continue home cebobamate 400 mg daily   - PT/OT following  - PMR following for ultimate discharge recommendations      History of Polysubstance Use - Schizophrenia - Agitation - IVC on 12/16/24:   Patient has a history of schizophrenia followed by ACT team in the community and also a documented history of crack cocaine, tobacco, methamphetamine, and marijuana use. UTox this admission positive for amphetamines and cocaine. Negative ethanol on admission with low CIWA scores. Parents aware of his substance use. Initially somnolent as  noted above but developed agitation on 12/16/24.  This seems to be outside the time frame for alcohol withdrawal so do not feel appropriate to start medications for this at this time.  Suspect patient's somnolence is simply resolving and he is wishing to leave despite not being medically cleared to do so.  Seen by psychiatry when attempting to leave [facility] 12/16/24 and an IVC was placed.  Will continue on safety precautions.  Could discuss with general psychiatry team following if additional engagement of the addiciton medicine team would be of benefit particularly with connecting patient with outpatient therapeutic resources.   - Psychiatry following, appreciate recommendations  - Consider addiction medicine consult as well   - Patient is currently under IVC with 1:1 sitter   - Continue haloperidol  15 mg nightly   - PRN for agitation:  IV haldol  5 mg and IV benadryl  25 mg PRN for agitation (first line) and IV ativan  2mg  (second line)   - Consult social work     Failure to Thrive:   Patient has had a 25 lb weight loss over the past year. Nutrition consultation with recommendations for regular diet with folic acid , thiamine , MVI and high protein supplements. Vitamin A and E normal range. Manganese normal.  Low serum copper level (uncertain significance clinically)  Normal b12 and folate.   - Continue thiamine  100 mg daily   - Continue multivitamin daily   - Continue folic acid  1 mg daily   - Continue famotidine  20 mg BID    - Follow up vitamin B6     Thrombocytopenia (resolved)  Thrombocytopenia nadired in 40s, now recovered to normal.  B12 and folate normal range. No known liver disease. No evidence of DIC. Most likely secondary to bone marrow suppression.   - Continue daily CBC   - Continue lovenox  for VTE prophylaxis      Advanced care planning  - Code status: full   - Healthcare proxy: parents  - Disposition: pending IVC removal, PT/OT recs and PM&R re-evaluation - suspect patient will not currently be agreeable to rehab but will discuss with him and parents  PT/OT recommendations: 5x weekly, High intensity (12/09/24) / 5x weekly, High intensity (12/18/24)  DME needs: Defer to post acute (12/09/24) / None (Pt reports using shower chair at baseline) (12/15/24)     FEN/GI/PPX  - Diet: regular  - IVF: none  - Bowel regimen: n/a  - GI ppx: n/a  - DVT ppx: enoxaparin     I personally spent 30 minutes face-to-face and non-face-to-face in the care of this patient, which includes all pre, intra, and post visit time on the date of service.  All documented time was specific to the E/M visit and does not include any procedures that may have been performed.    ___________________________________________________________________    Subjective:  Sleeping comfortably. Per NA was up this morning, ate breakfast.  Doing well with no agitation.    Labs/Studies:  Labs and Studies from the last 24hrs per EMR and Reviewed    Objective:  Temp:  [36.6 ??C (97.9 ??F)-36.8 ??C (98.2 ??F)] 36.8 ??C (98.2 ??F)  Pulse:  [65-78] 65  Resp:  [18] 18  BP: (105-117)/(68-72) 105/68  SpO2:  [98 %-100 %] 98 %    Gen: alert, oriented, no distress, sleeping comfortably in bed  HEENT: atraumatic, no scleral icterus, no nasal drainage.  Moist mucus membranes. +horizontal nystagmus  CV: RRR, no peripheral edema, distal pulses intact  Resp: breathing comfortably without accessory muscle use,  speaking in full sentences  Abd: nondistended, soft, nontender throughout  Ext: warm, well perfused, no edema or trauma  Skin: no rash, no bruising  Neuro: sleeping comfortably, no abnormal movements  Psych: calm, appropriate for situation

## 2024-12-20 NOTE — Plan of Care (Signed)
 Shift Summary  Safety interventions and fall risk measures were maintained throughout the shift, with no documented injuries or falls.  Comfort and nutritional needs were met with full meal and supplement intake.   droplet isolation precautions were followed.  Hourly rounds were performed consistently to monitor status and provide support.  Overall, the shift was stable with no new complications documented.    Absence of Hospital-Acquired Illness or Injury: No hospital-acquired injuries or illnesses were documented during the shift, and safety interventions such as low bed positioning and adjusted lighting were maintained throughout.    Optimal Comfort and Wellbeing: Comfort measures were supported by regular rounds and environmental adjustments, and 100% of meals were consumed with adequate supplement intake.    Absence of Infection Signs and Symptoms: SOFA scores remained at 0 throughout the shift, and droplet isolation precautions were observed.    Absence of Fall and Fall-Related Injury: Fall risk interventions including hourly visual checks, scheduled toileting, and bed safety measures were consistently implemented, with no falls or related injuries documented.

## 2024-12-21 LAB — BASIC METABOLIC PANEL
ANION GAP: 7 mmol/L (ref 5–14)
BLOOD UREA NITROGEN: 12 mg/dL (ref 9–23)
BUN / CREAT RATIO: 12
CALCIUM: 9.7 mg/dL (ref 8.7–10.4)
CHLORIDE: 103 mmol/L (ref 98–107)
CO2: 28 mmol/L (ref 20.0–31.0)
CREATININE: 1.04 mg/dL (ref 0.73–1.18)
EGFR CKD-EPI (2021) MALE: >90 mL/min/{1.73_m2} (ref >=60)
GLUCOSE RANDOM: 79 mg/dL (ref 70–179)
POTASSIUM: 4.7 mmol/L (ref 3.4–4.8)
SODIUM: 138 mmol/L (ref 135–145)

## 2024-12-21 LAB — CBC
HEMATOCRIT: 41.6 % (ref 39.0–48.0)
HEMOGLOBIN: 14.1 g/dL (ref 12.9–16.5)
MEAN CORPUSCULAR HEMOGLOBIN CONC: 34 g/dL (ref 32.0–36.0)
MEAN CORPUSCULAR HEMOGLOBIN: 32.2 pg (ref 25.9–32.4)
MEAN CORPUSCULAR VOLUME: 94.7 fL (ref 77.6–95.7)
MEAN PLATELET VOLUME: 8.5 fL (ref 6.8–10.7)
PLATELET COUNT: 295 10*9/L (ref 150–450)
RED BLOOD CELL COUNT: 4.39 10*12/L (ref 4.26–5.60)
RED CELL DISTRIBUTION WIDTH: 12.9 % (ref 12.2–15.2)
WBC ADJUSTED: 7.4 10*9/L (ref 3.6–11.2)

## 2024-12-21 MED ORDER — MIDODRINE 10 MG TABLET
ORAL_TABLET | Freq: Two times a day (BID) | ORAL | 0 refills | 30.00000 days | Status: CP
Start: 2024-12-21 — End: 2025-01-20

## 2024-12-21 MED ORDER — MIDODRINE 5 MG TABLET
ORAL_TABLET | Freq: Once | ORAL | 0 refills | 1.00000 days | Status: CP
Start: 2024-12-21 — End: 2024-12-22
  Filled 2024-12-21: qty 3, 1d supply, fill #0

## 2024-12-21 MED ADMIN — levocarnitine (CARNITOR) (SF) oral solution: 500 mg | ORAL | @ 02:00:00

## 2024-12-21 MED ADMIN — levocarnitine (CARNITOR) (SF) oral solution: 500 mg | ORAL | @ 14:00:00 | Stop: 2024-12-21

## 2024-12-21 MED ADMIN — midodrine (PROAMATINE) tablet 10 mg: 10 mg | ORAL | @ 20:00:00 | Stop: 2024-12-21

## 2024-12-21 MED ADMIN — midodrine (PROAMATINE) tablet 10 mg: 10 mg | ORAL | @ 11:00:00 | Stop: 2024-12-21

## 2024-12-21 MED ADMIN — clonazePAM (KlonoPIN) tablet 1 mg: 1 mg | ORAL | @ 14:00:00 | Stop: 2024-12-21

## 2024-12-21 MED ADMIN — midodrine (PROAMATINE) tablet 15 mg: 15 mg | ORAL | @ 02:00:00

## 2024-12-21 MED ADMIN — cetirizine (ZYRTEC) tablet 10 mg: 10 mg | ORAL | @ 14:00:00 | Stop: 2024-12-21

## 2024-12-21 MED ADMIN — haloperidol (HALDOL) tablet 15 mg: 15 mg | ORAL | @ 02:00:00 | NDC 72603023001

## 2024-12-21 MED ADMIN — enoxaparin (LOVENOX) syringe 40 mg: 40 mg | SUBCUTANEOUS | @ 14:00:00 | Stop: 2024-12-21

## 2024-12-21 MED ADMIN — folic acid (FOLVITE) tablet 1 mg: 1 mg | ORAL | @ 14:00:00 | Stop: 2024-12-21

## 2024-12-21 MED ADMIN — cenobamate (XCOPRI) tablet 400 mg: 400 mg | ORAL | @ 14:00:00 | Stop: 2024-12-21 | NDC 71699005099

## 2024-12-21 MED ADMIN — lamoTRIgine (LaMICtal) tablet 200 mg: 200 mg | ORAL | @ 02:00:00

## 2024-12-21 MED ADMIN — lamoTRIgine (LaMICtal) tablet 200 mg: 200 mg | ORAL | @ 14:00:00 | Stop: 2024-12-21

## 2024-12-21 MED ADMIN — clonazePAM (KlonoPIN) tablet 2 mg: 2 mg | ORAL | @ 02:00:00

## 2024-12-21 MED ADMIN — multivitamins, therapeutic with minerals tablet 1 tablet: 1 | ORAL | @ 14:00:00 | Stop: 2024-12-21

## 2024-12-21 MED ADMIN — thiamine mononitrate (vit B1) tablet 100 mg: 100 mg | ORAL | @ 14:00:00 | Stop: 2024-12-21

## 2024-12-21 MED ADMIN — divalproex ER (DEPAKOTE ER) extended released 24 hr tablet 1,500 mg: 1500 mg | ORAL | @ 02:00:00

## 2024-12-21 MED ADMIN — guaiFENesin (ROBITUSSIN) oral syrup: 200 mg | ORAL | @ 11:00:00 | Stop: 2024-12-21

## 2024-12-21 MED ADMIN — pneumococcal 21-valent (PF) vaccine (CAPVAXIVE) injection 0.5 mL: .5 mL | INTRAMUSCULAR | @ 20:00:00 | Stop: 2024-12-21

## 2024-12-21 MED ADMIN — famotidine (PEPCID) tablet 20 mg: 20 mg | ORAL | @ 14:00:00 | Stop: 2024-12-21

## 2024-12-21 MED ADMIN — famotidine (PEPCID) tablet 20 mg: 20 mg | ORAL | @ 02:00:00

## 2024-12-21 MED ADMIN — flu vaccine TS 2025-26 (Fluarix,Flulaval,Fluzone) (6mos up)(PF) 45 mcg(15mcgx3)/0.5 mL IM syringe: .5 mL | INTRAMUSCULAR | @ 20:00:00 | Stop: 2024-12-21

## 2024-12-21 NOTE — Plan of Care (Signed)
 Shift Summary  Famotidine  was administered during the shift.   Slurred speech and nystagmus were documented during neurological assessment.   Sitter was present at bedside for safety during part of the shift.   Ambulated 8 ft independently and maintained ability to turn self in bed.   Mattress was placed on the floor as an additional fall prevention measure.   Maintained independence in activities of daily living and no falls or injuries occurred during the shift.     Optimal Comfort and Wellbeing: Pain remained at 0 throughout the shift and no comfort interventions were required; famotidine  was administered during the shift.     Absence of Fall and Fall-Related Injury: Fall prevention strategies were consistently maintained, including sitter presence, low bed, and frequent toileting, with no falls or injuries documented.     Improved Ability to Complete Activities of Daily Living: Maintained independence in mobility and hygiene, ambulated 8 ft, and was able to turn self in bed throughout the shift.     Improved Nutritional Intake: Nutrition remained adequate and feeding was independent during the shift.

## 2024-12-21 NOTE — Discharge Summary (Signed)
 Physician Discharge Summary [facility]  6 MEDICINE SPECIALTY UNIT [facility]  [address]  [place] [address]  Dept: [phone]  Loc: [phone]     Identifying Information:   Austin Banks  [birth year]  [id number]    Primary Care Physician: [facility]   Code Status: Full Code    Admit Date: 12/08/2024    Discharge Date: 12/21/2024     Discharge To: Home with Home Health and/or PT/OT    Discharge Service: [facility]     Discharge Attending Physician: No att. providers found    Discharge Diagnoses:  Principal Problem:    Shock, unspecified    (CMS-HCC) (POA: Yes)  Active Problems:    Paranoid schizophrenia    (CMS-HCC) (POA: Yes)    Seizures    (CMS-HCC) (POA: Yes)    Septic shock    (CMS-HCC) (POA: Unknown)    Upper respiratory infection, viral (POA: Unknown)    Infection due to human metapneumovirus (hMPV) (POA: Unknown)  Resolved Problems:    * No resolved hospital problems. *      Outpatient Provider Follow Up Issues:   [  ] ensure ACT team engagement  [  ] check BP to ensure remains stable on midodrine     Hospital Course:   Austin Banks is a 34 yo M with past medical history of autoimmune encephalitis and epilepsy (on maintenance tocilizumab  and AED regimen), polysubstance use disorder (cocaine, marijuana, methamphetamine) who was admitted to the MICU 12/08/24 with undifferentiated shock in the setting of metapneumovirus infection. Transferred out of MICU 1/22. He was placed under IVC after an episode of agitation and trying to leave the hospital from 1/21-1/26, at time of which it was released given improvement to psychiatric baseline.      Undifferentiated Shock - Metapneumovirus + - Possible Dysautonomia:   Patient presented with undifferentiated shock.  Presumed secondary to metapneumovirus, possible aspiration but felt less likely. Blood cultures negative. UA without infection. RUQ US  without evidence of infection. Completed a 7 day course of antibiotics (vanc/zosyn  initially then ceftriaxone ).  He has been afebrile since 12/09/24.  Ultimately there was concern that he has some baseline dysautonomia related to his underlying neurologic diagnoses.  He was weaned off of pressors after initiation of midodrine , after which blood pressures have remained normal range.  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  Some somnolence at baseline too, possibly related to medications, but certainly worsened on presentation. CT head was negative for acute intracranial process. MRI brain with diffuse cortical and cerebellar atrophy advanced for age but no acute abnormalities. EEG without seizure activity. Valproate level therapeutic.  Neurology was following and ultimately recommended continuing home medications.  Psychiatry also consulted and feel that likely patient's baseline somnolence is multifactorial with chronic neurological conditions, sedating effects of his necessary medications, acute illness, cocaine withdrawal.  Additional considerations could be reactivation of encephalitis, ADEM 2/2 to viral infection although these seem less likely and patient is improving overall.  TSH low but free T4 reassuring (euthryoid sick syndrome), RPR and HIV non-reactive.  Of note patient has had persistent bilateral nystagmus during his hospital stay per his baseline.  - s/p Thiamine  200 mg x 5 d then 100 mg daily   - Seizure precautions   -  Continue home tocilizumab  every 7 days on Fridays  - Continue home divalproate 1500 mg nightly  - Continue home levocarnitine  500 mg BID   - Continue home lamotrigine  200 mg BID   - Continue home clonazepam  1 mg AM and 2 mg PM   - Continue home cebobamate 400 mg daily   - PT/OT following - recommended AIR placement, however patient declined. Instead arranged for home PT/OT through home health.     History of Polysubstance Use - Schizophrenia - Agitation - IVC on 12/16/24:   Patient has a history of schizophrenia followed by ACT team in the community and also a documented history of crack cocaine, tobacco, methamphetamine, and marijuana use. UTox this admission positive for amphetamines and cocaine. Negative ethanol on admission with low CIWA scores. Parents aware of his substance use. Initially somnolent as noted above but developed agitation on 12/16/24 (outside of the alcohol withdrawal window).  Suspect patient's somnolence was simply resolving and he wished to leave despite not being medically cleared to do so.  Seen by psychiatry when attempting to leave [facility] 12/16/24 and an IVC was placed.  He was continued on his home haldol  15 mg nightly, and after the one agitation episode, otherwise remained calm without a change in his medication regimen and with no further agitation prn's needed.  On day of discharge, 1/26, he was felt to be at his psychiatric baseline (still with occasional hallucinations of seeing wings and some paranoia) and IVC was released. Parents in agreement and picked him up.  Voicemail for warm handoff was left for his ACT team.     Failure to Thrive:   Patient has had a 25 lb weight loss over the past year. Nutrition consultation with recommendations for regular diet with folic acid , thiamine , MVI and high protein supplements. Vitamin A and E normal range. Manganese normal.  Low serum copper level (uncertain significance clinically)  Normal b12 and folate.   - Continue thiamine  100 mg daily   - Continue multivitamin daily   - Continue folic acid  1 mg daily   - Continue famotidine  20 mg BID       Thrombocytopenia (resolved)  Thrombocytopenia nadired in 40s, now recovered to normal.  B12 and folate normal range. No known liver disease. No evidence of DIC. Most likely secondary to bone marrow suppression in acute illness.     Procedures:  EEG  No admission procedures for hospital encounter.  ______________________________________________________________________  Discharge Medications:     Your Medication List        START taking these medications      midodrine  10 MG tablet  Commonly known as: PROAMATINE   Take 1 tablet (10 mg total) by mouth two (2) times a day.     midodrine  5 MG tablet  Commonly known as: PROAMATINE   Take 3 tablets (15 mg total) by mouth nightly.     midodrine  5 MG tablet  Commonly known as: PROAMATINE   Take 3 tablets (15 mg total) by mouth once for 1 dose. Evening on 1/26.     midodrine  10 MG tablet  Commonly known as: PROAMATINE   Take 1 tablet (10 mg total) by mouth once for 1 dose. 1/27 morning dose.  Start taking on: December 22, 2024            CONTINUE taking these medications      ACTEMRA  ACTPEN 162 mg/0.9 mL Pnij subcutaneous injection  Generic drug: tocilizumab   Inject the contents of 1 pen (162 mg) under the skin  every seven (7) days.     cetirizine  10 MG tablet  Commonly known as: ZYRTEC   Take 1 tablet (10 mg total) by mouth every morning.     clonazePAM  1 MG tablet  Commonly known as: KlonoPIN   TAKE 1 TABLET BY MOUTH IN THE MORNING and TAKE 2 TABLETS AT BEDTIME (PER patient) may take additional tablets after a big seizure or 2 small one. no more than 4 tablets per day     divalproex  ER 500 MG extended released 24 hr tablet  Commonly known as: DEPAKOTE  ER  TAKE 3 TABLETS BY MOUTH NIGHTLY     empty container Misc  Use as directed to dispose of injections     folic acid  1 MG tablet  Commonly known as: FOLVITE   Take 1 tablet (1 mg total) by mouth daily.     haloperidol  5 MG tablet  Commonly known as: HALDOL   Take 3 tablets (15 mg total) by mouth nightly.     lamoTRIgine  200 MG tablet  Commonly known as: LaMICtal   TAKE 1 TABLET BY MOUTH IN THE MORNING and TAKE 1 TABLET AT BEDTIME     levocarnitine  500 mg Tab  Take 1 tablet (500 mg) by mouth Two (2) times a day.     multivit-min-iron  fum-folic ac 7.5 mg iron -400 mcg Tab  Take 1 tablet by mouth daily.     naloxone  4 mg/actuation nasal spray  Commonly known as: NARCAN   Give one nasal spray for mental status change due to excessive illicit drug use and call 911     NAYZILAM  5 mg/spray (0.1 mL) Spry  Generic drug: midazolam   1 spray (5 mg) into 1 nostril for a seizure 3 minutes long; May give 2nd dose after 10 minutes for recurrent seizures. do not give a second dose if the patient has trouble breathing, MAX- 2 sprays in 72hrs and 4 sprays a week.     XCOPRI  200 mg tablet  Generic drug: cenobamate   TAKE 2 TABLETS BY MOUTH AT BEDTIME per patient              Allergies:  Lacosamide   ______________________________________________________________________  Pending Test Results (if blank, then none):      Most Recent Labs:  All lab results last 24 hours -   Recent Results (from the past 24 hours)   Basic Metabolic Panel    Collection Time: 12/21/24 12:05 PM   Result Value Ref Range    Sodium 138 135 - 145 mmol/L    Potassium 4.7 3.4 - 4.8 mmol/L    Chloride 103 98 - 107 mmol/L    CO2 28.0 20.0 - 31.0 mmol/L    Anion Gap 7 5 - 14 mmol/L    BUN 12 9 - 23 mg/dL    Creatinine 8.95 9.26 - 1.18 mg/dL    BUN/Creatinine Ratio 12     eGFR CKD-EPI (2021) Male >90 >=60 mL/min/1.50m2    Glucose 79 70 - 179 mg/dL    Calcium  9.7 8.7 - 10.4 mg/dL   CBC    Collection Time: 12/21/24 12:05 PM   Result Value Ref Range    WBC 7.4 3.6 - 11.2 10*9/L    RBC 4.39 4.26 - 5.60 10*12/L    HGB 14.1 12.9 - 16.5 g/dL    HCT 58.3 60.9 - 51.9 %    MCV 94.7 77.6 - 95.7 fL    MCH 32.2 25.9 - 32.4 pg    MCHC 34.0 32.0 - 36.0 g/dL    RDW  12.9 12.2 - 15.2 %    MPV 8.5 6.8 - 10.7 fL    Platelet 295 150 - 450 10*9/L       Relevant Studies/Radiology (if blank, then none):  MRI Brain W Wo Contrast  Result Date: 12/12/2024  EXAM: Magnetic resonance imaging, brain, without and with contrast material. ACCESSION: [id number] UN CLINICAL INDICATION: 34 years old Male with altered mental status  COMPARISON: CT head 12/09/2024, MRI brain 10/27/2024 TECHNIQUE: Multiplanar, multisequence MR imaging of the brain was performed without and with I.V. contrast. FINDINGS:  There is no focal parenchymal signal abnormality. Ventricles are normal in size. There is no midline shift. No extra-axial fluid collection. No evidence of intracranial hemorrhage. No diffusion weighted signal abnormality to suggest acute infarct. There is diffuse cortical atrophy advanced for the patient's age. There is cerebellar atrophy. No mass. There is no abnormal enhancement. Mucosal thickening in the ethmoid air cells and right maxillary sinus.     No acute intracranial abnormality. Diffuse cortical and cerebellar atrophy advanced for the patient's age.     XR Chest Portable  Result Date: 12/12/2024  EXAM: XR CHEST PORTABLE ACCESSION: [id number] UN REPORT DATE: 12/12/2024 10:02 AM CLINICAL INDICATION: COUGH  TECHNIQUE: Single View AP Chest Radiograph. COMPARISON: XR CHEST PORTABLE 12/09/2024 FINDINGS: Persistent medial left lower lung consolidation. No pleural effusion or pneumothorax. Normal heart size and mediastinal contours.     Persistent medial left lower lung consolidation due to atelectasis or airspace disease.     ECG 12 Lead  Result Date: 12/10/2024  NORMAL SINUS RHYTHM RIGHT ATRIAL ENLARGEMENT RIGHTWARD AXIS BORDERLINE ECG WHEN COMPARED WITH ECG OF 02-Feb-2024 19:33, QT HAS SHORTENED Confirmed by Antonetta Gull ([id number]) on 12/10/2024 1:50:50 PM    EEG video monitoring  Result Date: 12/10/2024  Table formatting from the original result was not included. FINAL LONGTERM VIDEO EEG MONITORING REPORT Identifying Information NAME: Carly Applegate  MRN: [id number] DOB: 02/23/1991  Ordering Provider:  Referred Self LOC: 5919/5919-01 Study Information EEG Start: 12/10/24 at 0835 EEG End: 12/10/2024 at 1241 HISTORY: 34 y.o. male with history of epilepsy thought to be secondary to GAD 9 with  typical semiologies of 1) blinking, left gaze, left facial twitching, LUE flexion, LLE tonic-clonic to generalized. Post-ictal confusion up to 20 minutes and 2) left gaze, behavioral arrest with post-ictal confusional state often with attempt to remove clothes lasting about 10 minutes. He has had cough, nausea, vomiting, malaise with generalized weakness for the last few days  INDICATION:  Seizures PATIENT STATE: Awake and Sleep EEG TECHNICAL DESCRIPTION Conditions of Recording:  Continuous EEG with simultaneous video recording was performed utilizing 21 active electrodes placed according to the international 10-20 system.  The study was recorded digitally with a bandpass of 1-70Hz  and a sampling rate of 200Hz  and was reviewed with the possibility of multiple reformatting.  The study was digitally processed with potential spike and seizure events identified for physician analysis and review.  Patient recognized events were identified by a push button marker and reviewed by the physician. Simultaneous video was reviewed for all patient events. DAY 1 EEG DESCRIPTION: 12/10/24  at 0835 to 12/10/2024 at 1241 Marciana JAYSON Blake, MD) Relevant Medications: cenobamate  (Xcopri ) 200 mg daily, clonazepam  (Klonopin ) 1 mg in the morning and 2 mg in the evening, divalproex  (Depakote ) 1500 mg nightly, and lamotrigine  (Lamictal ) 200 mg twice daily Background: The waking background showed retained organization. There was a slow posterior rhythm of 8 hz, which was symmetric and had reactivity. Anteriorly, there was more than expected  4-6 hz theta delta slowing. Attenuation of the occipital rhythm accompanied drowsiness.  The sleep background was appropriately organized with well-developed spindles and vertex waves.  These sleep transients showed appropriate morphology and are bilaterally synchronous and symmetrical.  Interictal Epileptiform Activity: There were no epileptiform abnormalities or periodic patterns noted. Ictal Activity: There were no ictal patterns noted. Events: There were no clinical events. EEG SUMMARY INTERPRETATION: This is an abnormal awake and asleep EEG due to: Diffuse slowing with retained reactivity and sleep states. CLINICAL CORRELATION / SUMMARY of RECORDING: These findings are consistent with a non-specific mild degree of encephalopathy.    and There were no epileptiform discharges or seizures. Interpreting Provider: Macario JAYSON Blake, MD [id number] Seizure Risk Score Clinical risk score based on EEG findings and clinical history of seizures to aid in determination of optimal duration of EEG monitoring for detection of electrographic seizures. Score has not been validated in patients under age 53 or following cardiac arrest. No Pertinent EEG findings - 0 points Add 1 point if known history of epilepsy or prior clinical seizure. Risk Group  Seizure Risk at 72 hours Duration of Monitoring  Seizure risk < 5%  Duration of Monitoring  Seizure risk < 2%  Low Risk, Score = 0  3.1% 1 Hour 3.3 Hours Medium Risk, Score = 1  12% 12 Hours 29 Hours High Risk, Score = 2 or greater             >25% >24 Hours >30 Hours Struck et al JAMA Neurology, January 2020 Not appropriate for EEG monitoring being performed for the following: Treatment of status epilepticus or seizures already documented on EEG Monitoring sedation/ burst suppression for management of intracranial pressure and/or paralyzed patients Diagnostic evaluation of transient episodes concerning for possible seizures (spell capture) Patients s/p cardiac arrest undergoing targeted temperature management  Amon dunker al. American Clinical Neurophysiology Society's Standardized Critical Care EEG Terminology: 2021 Version. Journal of Clinical Neurophysiology 38(1):p 1-29, January 2021.     ECG 12 Lead  Result Date: 12/10/2024  SINUS BRADYCARDIA WITH SINUS ARRHYTHMIA OTHERWISE NORMAL ECG WHEN COMPARED WITH ECG OF 08-Dec-2024 19:46, VENT. RATE HAS DECREASED by  39 bpm Confirmed by Antonetta Gull ([id number]) on 12/10/2024 1:17:08 PM    US  Abdomen Limited  Result Date: 12/10/2024  EXAM: US  ABDOMEN LIMITED ACCESSION: [id number] UN REPORT DATE: 12/09/2024 9:44 PM CLINICAL INDICATION: 34 years old with RUQ, Gallbladder  COMPARISON: CT abdomen and pelvis 10/28/2022. TECHNIQUE: Static and cine images of the right upper quadrant were performed. FINDINGS: LIVER: The liver was normal in echogenicity. No focal hepatic lesions. No biliary ductal dilatation.      Liver: 19.1 cm      Common bile duct: 0.2 cm GALLBLADDER: The gallbladder was physiologically distended with small amount of internal sludge. Sonographic Beverley sign was unable to be assessed due to medication or patient condition.  No pericholecystic fluid. No gallbladder wall thickening.      Gallbladder wall: 0.3 cm LIMITED RIGHT KIDNEY: No hydronephrosis.     1.  Small amount of gallbladder sludge. No evidence of acute cholecystitis. 2.  No biliary ductal dilatation.     Echocardiogram W Colorflow Spectral Doppler  Result Date: 12/09/2024  Patient Info Name:     Laren Whaling Age:     36 years DOB:     [birth year] Gender:     Male MRN:     [id number] Accession #:     [id number] UN Account #:     [id number] Ht:  185 cm Wt:     68 kg BSA:     1.87 m2 BP:     94 /     60 mmHg HR:     61 bpm Exam Date:     12/09/2024 11:30 AM Admit Date:     12/08/2024 Exam Type:     ECHOCARDIOGRAM W COLORFLOW SPECTRAL DOPPLER Technical Quality:     Good Staff Sonographer:     Norlene Harder Reading Fellow:     Annalee HERO Pistiolis Ordering Physician:     Rea KATHEE Caroline Study Info Indications      - c/f cardiac dysfunction Procedure(s)   Complete two-dimensional, color flow and Doppler transthoracic echocardiogram is performed. Three dimensional echocardiographic imaging is performed during the transthoracic echocardiogram. Summary   1. The left ventricular systolic function is normal, LVEF is visually estimated at 55%.   2. The right ventricle is normal in size, with normal systolic function.   3. IVC size and inspiratory change suggest mildly elevated right atrial pressure. (5-10 mmHg). Left Ventricle   The left ventricle is normal in size with normal wall thickness. 3D echocardiography imaging was performed to better assess left ventricular contractile function. The concurrent supervision requirements have been met for 3D imaging. The left ventricular ejection fraction was quantified (3D) at 60 %. The left ventricular systolic function is normal, LVEF is visually estimated at 55%. There is normal left ventricular diastolic function. Right Ventricle   The right ventricle is normal in size, with normal systolic function. Left Atrium   The left atrium is normal in size. Right Atrium   The right atrium is normal in size. Aortic Valve   The aortic valve is trileaflet with normal appearing leaflets with normal excursion. There is no significant aortic regurgitation. There is no evidence of a significant transvalvular gradient. Mitral Valve   The mitral valve leaflets are normal with normal leaflet mobility. There is trivial mitral valve regurgitation. Tricuspid Valve   The tricuspid valve leaflets are normal, with normal leaflet mobility. There is trivial tricuspid regurgitation. The pulmonary systolic pressure cannot be estimated due to insufficient TR signal. Pulmonic Valve   The pulmonic valve is normal. There is no evidence of a significant transvalvular gradient. There is trivial pulmonic regurgitation. Aorta   The aorta is normal in size in the visualized segments. Inferior Vena Cava   IVC size and inspiratory change suggest mildly elevated right atrial pressure. (5-10 mmHg). Pericardium/Pleural   There is no pericardial effusion. Other Findings   Rhythm: Sinus Rhythm. Ventricles ---------------------------------------------------------------------- Name                                 Value        Normal ---------------------------------------------------------------------- LV Dimensions 2D/MM ----------------------------------------------------------------------  IVS Diastolic Thickness (2D)                                0.7 cm       0.6-1.0 LVID Diastole (2D)                  5.4 cm       4.2-5.8  LVPW Diastolic Thickness (2D)                                0.7 cm  0.6-1.0 LVID Systole (2D)                   3.9 cm       2.5-4.0 LVOT Diameter                       2.5 cm               LV Mass Index (2D Cubed)           70 g/m2        49-115  Relative Wall Thickness (2D)                                  0.26        <=0.42 RV Dimensions 2D/MM ----------------------------------------------------------------------  RV Basal Diastolic Dimension                           3.3 cm       2.5-4.1 TAPSE                               2.0 cm         >=1.7 Atria ---------------------------------------------------------------------- Name                                 Value        Normal ---------------------------------------------------------------------- LA Dimensions ---------------------------------------------------------------------- LA Dimension (2D)                   3.9 cm       3.0-4.1 LA Volume Index (4C A-L)        26.27 ml/m2               LA Volume Index (2C A-L)        24.18 ml/m2               RA Dimensions ---------------------------------------------------------------------- RA Area (4C)                      13.8 cm2        <=18.0 RA Area (4C) Index              7.4 cm2/m2               RA ESV Index (4C MOD)             18 ml/m2         18-32 Left Ventricular Outflow Tract ---------------------------------------------------------------------- Name                                 Value        Normal ---------------------------------------------------------------------- LVOT 2D ---------------------------------------------------------------------- LVOT Diameter                       2.5 cm               LVOT Area                          4.9 cm2               LVOT Doppler ---------------------------------------------------------------------- LVOT Peak  Velocity                 0.7 m/s Mitral Valve ---------------------------------------------------------------------- Name                                 Value        Normal ---------------------------------------------------------------------- MV Diastolic Function ---------------------------------------------------------------------- MV E Peak Velocity                 65 cm/s               MV A Peak Velocity                 29 cm/s               MV E/A                                 2.3               MV Annular TDI ---------------------------------------------------------------------- MV Septal e' Velocity            11.6 cm/s         >=8.0 MV E/e' (Septal)                       5.6               MV Lateral e' Velocity           16.0 cm/s        >=10.0 MV E/e' (Lateral)                      4.1               MV e' Average                    13.8 cm/s               MV E/e' (Average)                      4.8 Tricuspid Valve ---------------------------------------------------------------------- Name                                 Value        Normal ---------------------------------------------------------------------- Estimated PAP/RSVP ---------------------------------------------------------------------- RA Pressure                         8 mmHg           <=5 Pulmonic Valve ---------------------------------------------------------------------- Name                                 Value        Normal ---------------------------------------------------------------------- PV Doppler ---------------------------------------------------------------------- PV Peak Velocity                   0.8 m/s Aorta ---------------------------------------------------------------------- Name                                 Value        Normal ---------------------------------------------------------------------- Ascending Aorta ---------------------------------------------------------------------- Ao Root Diameter (2D)  3.5 cm               Ao Root Diam Index (2D)          1.9 cm/m2 Venous ---------------------------------------------------------------------- Name                                 Value        Normal ---------------------------------------------------------------------- IVC/SVC ---------------------------------------------------------------------- IVC Diameter (Exp 2D)               2.2 cm         <=2.1 QLAB ---------------------------------------------------------------------- Name                                 Value        Normal ---------------------------------------------------------------------- Heart Model ---------------------------------------------------------------------- LV EF HM                              60 % Report Signatures Finalized by Odell Debby Grew  MD on 12/09/2024 04:33 PM Resident Serafim M Pistiolis on 12/09/2024 01:45 PM    XR Chest Portable  Result Date: 12/09/2024  EXAM: XR CHEST PORTABLE ACCESSION: [id number] UN REPORT DATE: 12/09/2024 12:30 PM CLINICAL INDICATION: CHEST PAIN  TECHNIQUE: Single View AP Chest Radiograph. COMPARISON: 12/08/2024 FINDINGS: Stable left retrocardiac opacity may reflect atelectasis/airspace disease. No new consolidation. No pneumothorax. No pleural effusion. Normal heart size and mediastinal contours.     Stable chest     CT Head Wo Contrast  Result Date: 12/09/2024  EXAM: Computed tomography, head or brain without contrast material. ACCESSION: [id number] UN CLINICAL INDICATION: 34 years old Male with Altered mental status  COMPARISON: 02/02/2024 CT and 10/27/2024 MRI TECHNIQUE: Axial CT images of the head from skull base to vertex without contrast. FINDINGS: No acute hemorrhage, mass effect, or midline shift. Gray-white junction appears preserved. Mild brain volume loss particularly of the cerebellum. No hydrocephalus. Basilar cisterns are patent. No significant extra-axial fluid collection. Partially seen near complete opacification of right maxillary sinus with chronic reactive osteitis and atretic sinus. Minimal paranasal sinus mucosal thickening are noted elsewhere. Visualized mastoid air cells and middle ears appear clear. External auditory canal cerumen are noted. Osseous structures appear intact.     No acute intracranial process seen. Overall appears relatively unchanged from prior examination.     XR Chest Portable  Result Date: 12/08/2024  EXAM: XR CHEST PORTABLE ACCESSION: [id number] UN REPORT DATE: 12/08/2024 10:24 PM CLINICAL INDICATION: COUGH  TECHNIQUE: Single View AP Chest Radiograph. COMPARISON: 02/02/2024 FINDINGS: Cardiomediastinal silhouette is unchanged. Left retrocardiac opacities. No pneumothorax or pleural effusion.     Mild left retrocardiac opacities. Atelectasis versus aspiration. Pneumonia cannot be excluded.    ______________________________________________________________________  Discharge Instructions:         Follow Up instructions and Outpatient Referrals     Ambulatory Referral to Home Health      Reason for referral: PT/OT    Is this a [facility] Patient?: No    Physician to follow patient's care: PCP    Disciplines requested:  Physical Therapy  Occupational Therapy       Physical Therapy requested: Evaluate and treat    Occupational Therapy Requested: Evaluate and treat    Call MD for:      Call MD for:  extreme fatigue  Call MD for:  persistent dizziness or light-headedness      Call MD for:  persistent nausea or vomiting      Call MD for: Temperature > 38.5 Celsius ( > 101.3 Fahrenheit)      Discharge instructions          Other Instructions       Call MD for:      Passing out, feeling very dizzy, new or worsening seizures, worsening confusion, etc.    Call MD for:  extreme fatigue      Call MD for:  persistent dizziness or light-headedness      Call MD for:  persistent nausea or vomiting      Call MD for: Temperature > 38.5 Celsius ( > 101.3 Fahrenheit)      Discharge instructions      You were seen for low blood pressure and found to have metapneumovirus infection.  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 Your blood pressure has been at a good level since starting midodrine  (10 mg in the morning, 10 mg in the afternoon, and 15 mg in the evening).  This will be added to your blister packs from [employer] which they said will be ready today.  No other changes were made to your medications.            Appointments which have been scheduled for you      Mar 10, 2025 10:00 AM  (Arrive by 9:45 AM)  RETURN NEUROIMMUNOLOGY with Elspeth GORMAN Devon, MD  [facility]OLOGY CLINIC [facility]) [address]  [address]  [place] [address]  [phone]        Mar 18, 2025 1:00 PM  (Arrive by 12:35 PM)  RETURN EPILEPSY with Amado Arcadio Rockers, FNP  [facility] NEUROLOGY CLINIC [address] [facility]) [address]  [address]  [place] [address]  [phone]             ______________________________________________________________________  Discharge Day Services:  BP 102/75  - Pulse 91  - Temp 36.6 ??C (97.9 ??F) (Oral)  - Resp 18  - Ht 185.4 cm (6' 1)  - Wt 68.5 kg (151 lb)  - SpO2 100%  - BMI 19.92 kg/m??   Pt seen on the day of discharge and determined appropriate for discharge.    Condition at Discharge: fair    Length of Discharge: I spent greater than 30 mins in the discharge of this patient.

## 2024-12-21 NOTE — Consults (Signed)
 [facility]  Follow-Up Psychiatry Consult Note      Date of admission: 12/08/2024  9:03 PM  Service Date: December 21, 2024  Primary Team: [facility])  LOS:  LOS: 12 days      Assessment:   Austin Banks is a 34 y.o. 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  Patient was seen in consultation by request of Harlene GORMAN Riding, MD for evaluation of altered mental status.     Austin Banks presents with symptoms, including visual hallucinations and delusions, consistent with his long-standing prior diagnosis of schizophrenia. Based on chart review these symptoms have been present outside of periods of substance use.  Patient's presentation is further complicated by his h/o cocaine use disorder, cannabis use disorder, and methamphetamine use disorder. UDS on admission was + for cocaine, withdrawal was possible. His sedation is likely multifactorial in the setting of his baseline somnolence, acute illness, cocaine withdrawal, and chronic neurologic conditions. Pt has been on Haldol  15 mg daily for the past few years. Though possible, it's not likely to be the major contributor to his initial presentation.     On assessment today, pt appears to have improved insight. Is agreeable to have at-home OT/PT after discharge. No prns have been used since 12/16/2024. Pt has been compliant with all aspects of his care. He has consistently denied SI/HI, A/VH, though endorsing seeing wings which is his psychiatric baseline. Mother and father have no acute safety concerns at this time and is agreeable to pick him up from the hospital. He is appropriate for discharge from a psychiatric standpoint. IVC is released today.     Diagnoses:   Active Hospital problems:  Principal Problem:    Shock, unspecified    (CMS-HCC)  Active Problems:    Paranoid schizophrenia    (CMS-HCC)    Seizures    (CMS-HCC)    Septic shock    (CMS-HCC)    Upper respiratory infection, viral    Infection due to human metapneumovirus (hMPV)       Problems edited/added by me:  No problems updated.    Risk Assessment:  ASQ screening result: not completed    -A full risk assessment was previously performed on 12/14/2024.  Risk assessment remains essentially unchanged.    Current suicide risk: low risk  Current homicide risk: low risk    Recommendations:     Safety and Observation Level:   -- This patient is not currently under IVC. If safety concerns arise, please page psychiatry for an evaluation. Recommend routine level of observation per primary team.    Medications:  -- Continue home Haldol  po 15 mg nightly.   -- Discontinue Haldol  2 mg PO TID PRN, if refusing PO, give Haldol  2 mg IV for agitation 1st line for agitation. Please use with PRN IV Benadryl .   -- Discontinue Ativan  2 mg PO PRN TID, if refusing PO, give Ativan  2 mg IV for agitation 2nd line for agitation.   -- Discontinue Benadryl  50 mg IV TID PRN for dystonia.   -- Continue Thiamine  200 mg IV Q8H x 5 days.  -- Continue home antiepileptic regimen as outlined by neurology.  Further Work-up:   -- As discussed with primary team, have a low threshold to repeat head imaging if facial asymmetry new/worsening from baseline    Behavioral / Environmental:   -- Please continue Delirium (prevention) protocol detailed in initial consult note.    Follow-up:  -- When patient is discharged, please ensure that their AVS includes information about the 49 Suicide & Crisis Lifeline.  -- The patient will follow-up with his ACT team for mental health care at the time of discharge.  -- We will sign off at this time.     Thank you for this consult request. Recommendations have been communicated to the primary team. Please page [phone] (adult psychiatry consults) for any questions or concerns.     Discussed with and seen by Fellow, Gordon Daniels, DO  Discussed with and seen by Attending, Penne Eagles, MD, who agrees with the assessment and plan.      Subjective     Relevant events since last seen by psychiatry:   No acute events over the wknd. No prn use.     Patient Interview:    Reports doing well. Denies SI/HI, A/VH. Continues to see wings on people which is chronic for him. Reports that other people see wings too. Voices wish to go home. Does not want to go to a rehab for PT. Is agreeable to PT at his place. Reports that he does not want to do drugs anymore. Would like to continue to smoke cigarettes though. Smoking cessation education provided but pt is not interested. States that he understands the risks of smoking but still would like to smoke.     ROS:   All systems reviewed as negative/unremarkable aside from the following pertinent positives and negatives: None    Collateral:   - Reviewed medical records in Epic  - Collateral with Austin Banks (mother) and Austin Banks (father) at [phone] - 30 mins: Parents are comfortable picking him up today. They asked appropriate questions about med changes and dispo plans. Parents think [name] is the best contact at [facility]. Parents check on him everyday. They live about 12 mins away from each other. All questions and concerns are addressed.  - Collateral with primary team: pt is medically cleared.   - Primary team left a VM to [facility] about his discharge.     Relevant Updates to past psychiatric, medical/surgical, family, or social history: NA    Current Medications:  Scheduled Meds:Scheduled Medications[1]  Continuous Infusions:Infusions Meds[2]  PRN Meds:.PRN Medications[3]    Objective:   Vital signs:   Temp:  [36.3 ??C (97.3 ??F)-37 ??C (98.6 ??F)] 36.6 ??C (97.9 ??F)  Pulse:  [68-91] 91  Resp:  [16-18] 18  BP: (101-129)/(59-90) 102/75  MAP (mmHg):  [72-101] 83  SpO2:  [97 %-100 %] 100 %    Physical Exam:  Gen: No acute distress. somnolent  Pulm: Normal work of breathing.  Neuro/MSK: Tone normal. Extraocular movements intact. (+) horizontal nystagmus. Face is asymmetric at rest (left eyelid drooping slightly more than right). Left eyebrow does not raise as high as the right. Smile/frown even BL. Hearing intact to conversation. Normal bulk/tone. Bulk is normal in all four extremities. Moving all extremities equally and spontaneously. Gait and station deferred given clinical.  Skin: warm and dry.    Mental Status Exam:  Appearance:  appears stated age, well-nourished, and in bed   Attitude:   calm, cooperative and polite   Behavior/Psychomotor:  appropriate eye contact and no abnormal movements   Speech/Language:  normal rate, not pressured, reduced volume, normal fluency. normal articulation   Mood:  ???I want to go home.???   Affect:  mood congruent and decreased range (constricted)   Thought process:  Overall linear   Thought content:    Denies SI, HI.    Perceptual disturbances:   visual hallucinations of wings on people's backs   Attention/concentration:  able to attend to interview without fluctuations in consciousness and on days of the week backwards performs correctly   Orientation:  grossly oriented.   Memory:  not formally tested, but grossly intact   Fund of knowledge:   not formally assessed   Insight:    Limited   Judgment:   Limited   Impulse Control:  Limited     Relevant laboratory/imaging data was reviewed.    Additional Psychometric Testing:  Not applicable.    Time-based billing disclaimer:  I personally spent 80   minutes face-to-face and non-face-to-face in the care of this patient, which includes all pre, intra, and post visit time on the date of service.  All documented time was specific to the E/M visit and does not include any procedures that may have been performed.         [1]    tocilizumab   162 mg Subcutaneous Q7 Days    cenobamate   400 mg Oral Daily    cetirizine   10 mg Oral Q AM    clonazePAM   1 mg Oral Daily    clonazePAM   2 mg Oral Nightly    divalproex  ER  1,500 mg Oral Nightly    enoxaparin  (LOVENOX ) injection  40 mg Subcutaneous Q24H    famotidine   20 mg Oral BID    flu vac ts 2025-26(29mos up)-PF  0.5 mL Intramuscular During hospitalization    folic acid   1 mg Oral Daily    haloperidol   15 mg Oral Nightly    lamoTRIgine   200 mg Oral BID    levocarnitine  (SF)  500 mg Oral BID    midodrine   10 mg Oral BID    midodrine   15 mg Oral Nightly    multivitamins (ADULT)  1 tablet Oral Daily    pneumococcal 21-valent (PF) vaccine  0.5 mL Intramuscular During hospitalization    thiamine  mononitrate (vit B1)  100 mg Oral Daily   [2] [3] acetaminophen , aluminum-magnesium  hydroxide-simethicone, diphenhydrAMINE , guaiFENesin , haloperidol  LACTATE, LORazepam , melatonin, senna

## 2024-12-22 MED ORDER — MIDODRINE 10 MG TABLET
ORAL_TABLET | Freq: Once | ORAL | 0 refills | 1.00000 days | Status: CP
Start: 2024-12-22 — End: 2024-12-22
  Filled 2024-12-21: qty 1, 1d supply, fill #0

## 2024-12-22 NOTE — Patient Instructions (Signed)
 Thank you for taking the time to speak with me today. I am happy to hear that you are home and recovering well. I wanted to provide you with the number for the 24/7 Nurse Line.  Please reference [phone] for the 24/7 Nurse line. This is for non-emergent needs or questions.    Roma, RN

## 2024-12-25 ENCOUNTER — Ambulatory Visit: Admit: 2024-12-25 | Discharge: 2024-12-26 | Payer: Medicaid (Managed Care)

## 2024-12-25 DIAGNOSIS — G40909 Epilepsy, unspecified, not intractable, without status epilepticus: Principal | ICD-10-CM

## 2024-12-25 DIAGNOSIS — G0481 Other encephalitis and encephalomyelitis: Secondary | ICD-10-CM

## 2024-12-25 DIAGNOSIS — G40219 Localization-related (focal) (partial) symptomatic epilepsy and epileptic syndromes with complex partial seizures, intractable, without status epilepticus: Secondary | ICD-10-CM

## 2024-12-25 MED ORDER — LEVOCARNITINE 500 MG TABLET
ORAL_TABLET | Freq: Two times a day (BID) | ORAL | 11 refills | 0.00000 days | Status: CP
Start: 2024-12-25 — End: ?

## 2024-12-25 MED ORDER — NALOXONE 4 MG/ACTUATION NASAL SPRAY
NASAL | 0 refills | 0.00000 days | Status: CP
Start: 2024-12-25 — End: 2024-12-25

## 2024-12-25 MED ORDER — DIVALPROEX ER 500 MG TABLET,EXTENDED RELEASE 24 HR
ORAL_TABLET | Freq: Every evening | ORAL | 11 refills | 30.00000 days | Status: CP
Start: 2024-12-25 — End: ?

## 2024-12-25 MED ORDER — NAYZILAM 5 MG/SPRAY (0.1 ML) NASAL SPRAY
NASAL | 0 refills | 0.00000 days | Status: CP
Start: 2024-12-25 — End: ?

## 2024-12-25 MED ORDER — FOLIC ACID 1 MG TABLET
ORAL_TABLET | Freq: Every day | ORAL | 5 refills | 30.00000 days | Status: CP
Start: 2024-12-25 — End: ?

## 2024-12-25 MED ORDER — CENOBAMATE 200 MG TABLET
ORAL_TABLET | Freq: Every day | ORAL | 3 refills | 60.00000 days | Status: CP
Start: 2024-12-25 — End: ?

## 2024-12-25 MED ORDER — LAMOTRIGINE 200 MG TABLET
ORAL_TABLET | Freq: Two times a day (BID) | ORAL | 11 refills | 30.00000 days | Status: CP
Start: 2024-12-25 — End: ?

## 2024-12-25 MED ORDER — MULTIVITAMIN-MINERALS-IRON FUMARATE 7.5 MG-FOLIC ACID 400 MCG TABLET
ORAL_TABLET | Freq: Every day | ORAL | 11 refills | 0.00000 days | Status: CP
Start: 2024-12-25 — End: ?

## 2024-12-25 NOTE — Progress Notes (Signed)
 Assessment:   Austin Banks is a 34 y.o. 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 The patient has a history of focal seizures with bilateral tonic clonic activity with onset at 34 years of age.  He also has history of polysubstance abuse resulted in ED visits and hospitalizations for mental status change and cluster GTCs.  Patient stated he uses cocaine as often as I can get it.    Last seizure was unknown as patient lives alone and amnesic to his seizures.  Patient reports last use of cocaine was yesterday.    On 12/08/2024, his parents found him unresponsive at his house with urine toxicology positive for cocaine and amphetamines on the day of admission.  Patient was hospitalized for 13 days, with the first 8 days in the MICU from septic shock and viral URI.  On the day of transferring to the floor, 12/16/2024, he wanted to leave AMA, which necessitate involuntary commitment per psychiatry for his safety.      Seizures previously captured on prior EMU admission with clinical symptoms of rhythmic eye fluttering and staring ahead. Notably, some movements captured including whole body low amplitude shaking did not have electrographic correlate. Workup was also notable for positive serum autoimmune panel positive for GAD 65; subsequent malignancy workup returned negative. He resumed Actemra  .     Patient was previously having 1-3 non-convulsive seizures per week and witnessed GTC every 1 to 3 months. The frequency of his seizure is difficulty to obtain as patient lives alone and usually amnesic to his events.  He was last admitted to [facility] in March 2025 for cluster GTC    Seizure Type(s): left fronto-temporal focal seizures with bilateral tonic -clonic activity.  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     - Parents were instructed to keep Nayzilam  and Narcan  with them.  Administer Narcan  if they found patient unresponsive and suspect he has over dose.  Administer Nayzilam  if patient is actively convulsing.     - long discussion regarding the harm of death if he continue to use illicit drugs, patient stated the potential harm for uncontrolled seizures leading to death or over dose will not change his behavior    Labs: 12/21/2024- cbc and CMP were normal .    Encouraged patient's to avoid using crack or cocaine as it will increase his seizure frequency.  I recommended seizure precautions with regards to avoiding unsupervised water  recreational activity, climbing or working at heights, operation of heavy or dangerous machinery, caution around fire and sources of high heat, as well as any other activity which could put you at danger in case of a seizure. Taking a bath is generally not recommended for a patient with uncontrolled epilepsy. I also reviewed the [place] DMV law and recommended to not drive unless approved by the [facility].    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  He is accompanied by his parents    INTERVAL HISTORY  He was  discharged 4 days ago following hospitalization for complications related to substance use, including septic shock and viral URI.  During this admission, he required ICU care after being found unresponsive at home and was noted to be dehydrated. Since discharge, he has resumed his prescribed medications, including Depakote , levetiracetam , Xcopri , clonazepam , Haldol , L-carnitine, and a multivitamin, with his parents managing weekly medication delivery. His seizure frequency is unknown. Seizure frequency and severity affected by substance use, medication adherence, sleep, and nutrition. He does not recall details of a recent fall on his birthday. He is eating and drinking. He lives independently, with his parents providing regular assistance.    He continues to use illicit substances, including cocaine as recently as the night before this visit, and has not maintained abstinence since hospital discharge. He has used Narcan  nasal spray on himself when feeling unwell, suspecting a seizure. His parents express concern regarding ongoing substance use and associated risks of overdose and seizure-related complications.    PRIOR CLINICAL COURSE  He carries a diagnosis of intractable localization-related epilepsy with complex partial seizures, clustering focal seizures, and a history of focal status epilepticus, as well as GAD 65 encephalitis. He has a history of substance use disorder, with recent hospitalization for septic shock, dehydration and encephalopathy following methamphetamine and cocaine use. His antiepileptic regimen has included Depakote , levetiracetam , Xcopri , and clonazepam , with Haldol  for behavioral management. Family remains actively involved in his care and expresses significant concern regarding ongoing substance use and health risks.      Seizure risk factors:He  was the product of an uncomplicated pregnancy and delivery.  The patient had a normal development.There is no history febrile seizure as an infant or child, meningitis, encephalitis, known brain structural abnormality,or significant head trauma.  There is no family history of seizures or epilepsy.       Onset: The patient had his first seizure at age 75  Number of seizure types: two     Seizure/seizure-like event  Description: focal onset with secondary generalization  - Semiology: Begins with right eye deviation, left facial clonic jerking, eye blinking, mumbling. LUE flexed with coordinated back and forth movements of his bilateral lower extremities and drool from the left side of his mouth. All movement abruptly ceases at ictal offset with pelvic thrusting. Family also describes hand automatisms after shaking at times. Post-ictal confusion lasting up to 20 minutes.   Frequency: Previously 3 times per week, now 1 every 3-4 months     Seizure/seizure-like event  Description: focal unaware  - Semiology: Behavior arrest and arrest with confusion afterwards. Attempts to remove clothes afterwards  Frequency: Previously twice per month, now unclear      Work up done   EEG has shown  vEEG/ EMU evaluation: in June 2021  SUMMARY: This an abnormal EEG due to   - Background slowing   - Epileptiform activity seen independently in the right  and left, maximal right temporal >frontal-central regions   - Electro clinical seizures, with less well defined ictal  patterns, although with suggestion of left and right  frontal-temporal onsets.   CLINICAL INTERPRETATION   This EEG is consistent with a moderate a diffuse cerebral  dysfunction which could be secondary to toxic, metabolic, or  primary neuronal disorder. The epileptiform activity suggests a   potential for focal onset seizures involving the right and left,  maximal right temporal regions. Electro clinical seizures, of  frontal semiology and likely involving the left and right head  regions were seen as described above.  Of note, there are body  movements seen that are not epileptic in nature.  Brain MRI imaging on 04/30/2020:  unremarkable.        Other medical issues:   1. Driving: no  Serum  GAD antibodies (40 nmol/l).  1. CT Chest/Abdomen/Pelvis, : Unremarkable   2. Testicular Ultra sound,Unremarkable   3. GAD in CSF was high at 54.7 nmol/l (normal <= 0.02)  CSF studies (08/25/2020): Albumin  12.9 WNL, CSF IgG 2.5 WNL, autoimmune panel with gad 65 elevated at 54.7, CSF cell count with differential largely unremarkable, glucose 59 WNL, protein 24 WNL, culture with no organisms or PMNs. CSF oligoclonal bands with 4 or more bands unique to the CSF however the IgG index was not raised   4. Supplemental blood work: 08/25/2020: Parietal cell antibody IgG elevated at 55.4, thyroid peroxidase antibody elevated at 21.86  5. Continues follow up with neuroimmunology - getting IVIG - cellcept  started in Jan 2022 but stopped due to significant worsening of vomiting     Past Medical History: He  has a past medical history of Fall, Hepatitis B surface antigen positive, Schizophrenia (CMS-HCC), Seizure    (CMS-HCC), TBI (traumatic brain injury) (CMS-HCC), and Vitamin D deficiency.     Past Surgical History:    Past Surgical History:   Procedure Laterality Date    MANDIBLE FRACTURE SURGERY       Family History: His family history includes Cancer in his maternal aunt, maternal grandmother, and paternal grandfather; Diabetes in his father; Hypertension in his father and mother.     Social history: He  reports that he has been smoking cigarettes. He has a 10 pack-year smoking history. He has never used smokeless tobacco. He reports current alcohol use. He reports current drug use. Frequency: 7.00 times per week. Drugs: Marijuana, Cocaine, and Crack cocaine.        Medications: He has a current medication list which includes the following prescription(s): cetirizine , empty container, haloperidol , midodrine , midodrine , actemra  actpen, cenobamate , divalproex  er, folic acid , lamotrigine , levocarnitine , nayzilam , multivit-min-iron  fum-folic ac, naloxone , and [DISCONTINUED] vimpat .   Current Outpatient Medications   Medication Sig Dispense Refill    cetirizine  (ZYRTEC ) 10 MG tablet Take 1 tablet (10 mg total) by mouth every morning.      empty container Misc Use as directed to dispose of injections 1 each 3    haloperidol  (HALDOL ) 5 MG tablet Take 3 tablets (15 mg total) by mouth nightly.      midodrine  (PROAMATINE ) 10 MG tablet Take 1 tablet (10 mg total) by mouth two (2) times a day. 60 tablet 0    midodrine  (PROAMATINE ) 5 MG tablet Take 3 tablets (15 mg total) by mouth nightly. 90 tablet 0    tocilizumab  (ACTEMRA  ACTPEN) 162 mg/0.9 mL PnIj subcutaneous injection Inject the contents of 1 pen (162 mg) under the skin every seven (7) days. 3.6 mL 3  cenobamate  (XCOPRI ) 200 mg tablet Take 1 tablet (200 mg total) by mouth daily. 60 tablet 3    divalproex  ER (DEPAKOTE  ER) 500 MG extended released 24 hr tablet Take 3 tablets (1,500 mg total) by mouth nightly. 90 tablet 11    folic acid  (FOLVITE ) 1 MG tablet Take 1 tablet (1 mg total) by mouth daily. 30 tablet 5    lamoTRIgine  (LAMICTAL ) 200 MG tablet Take 1 tablet (200 mg total) by mouth two (2) times a day. 60 tablet 11    levocarnitine  500 mg Tab Take 1 tablet (500 mg) by mouth Two (2) times a day. 60 tablet 11    midazolam  (NAYZILAM ) 5 mg/spray (0.1 mL) Spry 1 spray (5 mg) into 1 nostril for convulsive seizure.  Do not give a second dose if the patient has trouble breathing, MAX- 2 sprays in 72hrs. Mom to keep 2 each 0    multivit-min-iron  fum-folic ac 7.5 mg iron -400 mcg Tab Take 1 tablet by mouth daily. 30 tablet 11    naloxone  (NARCAN ) 4 mg nasal spray Give one nasal spray for mental status change due to excessive illicit drug use and call 911.  Mom to keep 4 each 0     No current facility-administered medications for this visit.       Allergies: He is allergic to lacosamide  and levetiracetam .     Review of Systems:    Full 10 Points of Review of System are reviewed and negative except stated above in HPI    Constitutional:  No significant change in weight.  No difficulty sleeping or excessive daytime sleepiness  HEENT: No hearing problems or sinus problems.  Ophthalmologic:  No vision problems.  Cardiovascular: No history of hypertension, chest pain, or hyperlipidemia. Pulmonary:  No history of hemoptysis or asthma. No shortness of breath GI:  No significant diarrhea or constipation. No rectal bleeding GU:  No difficulty voiding.  Endocrine:  No history of diabetes or thyroid problems. Musculoskeletal: No joint or back pain.  Psychiatric:  No history of anxiety or depression.  Skin:  No significant rashes or lesions.  Hematologic:  No bleeding problems    Physical Examination:    Vitals signs :   Vitals:    12/25/24 1512   BP: 114/76   BP Site: L Arm   BP Position: Sitting   Pulse: 75   Temp: 36.2 ??C (97.2 ??F)   TempSrc: Temporal   Weight: 71.2 kg (156 lb 14.4 oz)   Height: 185.4 cm (6' 1)        PHQ-9: 6  GAD-7:  9  General: No acute distress, Well developed/groomed/nourished . Healing laceration on right brow  Extremities: No bilateral cyanosis, clubbing or edema.   Musculo Skeletal: No joint tenderness  Neurological:   MENTAL STATUS: somewhat drowsy.  Engaging in today's visit.   Able to answer questions appropriately.  Speech is mildly slurred.  Oriented to person, place, day of week and year.  Difficulty with concentration and unable to correctly name months of the year backward     CRANIAL NERVES:  Extraocular movements with nystagmus.  There was no evidence for facial asymmetry. Tongue in the midline.  MOTOR: stable ambulation.     Billing  I personally spent 56 minutes face-to-face and non-face-to-face in the care of this patient, which includes all pre, intra, and post visit time on the date of service.  All documented time was specific to the E/M visit and does not include any procedures that may  have been performed.
